# Patient Record
Sex: Female | Born: 1965 | Race: Black or African American | Hispanic: No | Marital: Married | Smoking: Never smoker
Health system: Southern US, Community
[De-identification: ages and names within clinical notes are randomized; demographics above are authoritative.]

## PROBLEM LIST (undated history)

## (undated) DIAGNOSIS — K219 Gastro-esophageal reflux disease without esophagitis: Secondary | ICD-10-CM

## (undated) DIAGNOSIS — IMO0001 Reserved for inherently not codable concepts without codable children: Secondary | ICD-10-CM

## (undated) DIAGNOSIS — M4800 Spinal stenosis, site unspecified: Secondary | ICD-10-CM

## (undated) DIAGNOSIS — I10 Essential (primary) hypertension: Secondary | ICD-10-CM

## (undated) DIAGNOSIS — E785 Hyperlipidemia, unspecified: Secondary | ICD-10-CM

## (undated) DIAGNOSIS — K449 Diaphragmatic hernia without obstruction or gangrene: Secondary | ICD-10-CM

## (undated) DIAGNOSIS — E039 Hypothyroidism, unspecified: Secondary | ICD-10-CM

## (undated) DIAGNOSIS — K2970 Gastritis, unspecified, without bleeding: Secondary | ICD-10-CM

## (undated) DIAGNOSIS — E669 Obesity, unspecified: Secondary | ICD-10-CM

## (undated) DIAGNOSIS — G8929 Other chronic pain: Secondary | ICD-10-CM

## (undated) DIAGNOSIS — R42 Dizziness and giddiness: Secondary | ICD-10-CM

## (undated) DIAGNOSIS — D649 Anemia, unspecified: Secondary | ICD-10-CM

## (undated) DIAGNOSIS — R7303 Prediabetes: Secondary | ICD-10-CM

## (undated) DIAGNOSIS — Z973 Presence of spectacles and contact lenses: Secondary | ICD-10-CM

## (undated) DIAGNOSIS — R519 Headache, unspecified: Secondary | ICD-10-CM

## (undated) DIAGNOSIS — E079 Disorder of thyroid, unspecified: Secondary | ICD-10-CM

## (undated) HISTORY — DX: Gastro-esophageal reflux disease without esophagitis: K21.9

## (undated) HISTORY — PX: THYROIDECTOMY: SHX17

## (undated) HISTORY — DX: Headache, unspecified: R51.9

## (undated) HISTORY — PX: CHOLECYSTECTOMY: SHX55

## (undated) HISTORY — DX: Other chronic pain: G89.29

## (undated) HISTORY — DX: Hyperlipidemia, unspecified: E78.5

## (undated) HISTORY — DX: Essential (primary) hypertension: I10

---

## 2010-12-19 ENCOUNTER — Ambulatory Visit: Payer: Self-pay

## 2010-12-26 ENCOUNTER — Ambulatory Visit: Payer: Self-pay

## 2011-01-26 ENCOUNTER — Ambulatory Visit: Payer: Self-pay

## 2011-02-19 ENCOUNTER — Observation Stay: Payer: Self-pay

## 2011-03-01 ENCOUNTER — Inpatient Hospital Stay: Payer: Self-pay

## 2011-09-06 ENCOUNTER — Emergency Department: Payer: Self-pay | Admitting: Emergency Medicine

## 2011-09-06 LAB — URINALYSIS, COMPLETE
Bacteria: NONE SEEN
Bilirubin,UR: NEGATIVE
Blood: NEGATIVE
Glucose,UR: NEGATIVE mg/dL (ref 0–75)
Ketone: NEGATIVE
Leukocyte Esterase: NEGATIVE
Nitrite: NEGATIVE
Ph: 7 (ref 4.5–8.0)
Protein: NEGATIVE
RBC,UR: 1 /HPF (ref 0–5)
Specific Gravity: 1.003 (ref 1.003–1.030)
Squamous Epithelial: <1
WBC UR: <1 /HPF (ref 0–5)

## 2011-09-06 LAB — CK TOTAL AND CKMB (NOT AT ARMC)
CK, Total: 128 U/L (ref 21–215)
CK-MB: 0.7 ng/mL (ref 0.5–3.6)

## 2011-09-06 LAB — PREGNANCY, URINE: Pregnancy Test, Urine: NEGATIVE m[IU]/mL

## 2011-09-06 LAB — COMPREHENSIVE METABOLIC PANEL
Albumin: 3.9 g/dL (ref 3.4–5.0)
Alkaline Phosphatase: 49 U/L — ABNORMAL LOW (ref 50–136)
Anion Gap: 8 (ref 7–16)
BUN: 13 mg/dL (ref 7–18)
Bilirubin,Total: 0.3 mg/dL (ref 0.2–1.0)
Calcium, Total: 9.2 mg/dL (ref 8.5–10.1)
Chloride: 105 mmol/L (ref 98–107)
Co2: 24 mmol/L (ref 21–32)
Creatinine: 0.62 mg/dL (ref 0.60–1.30)
EGFR (African American): >60
EGFR (Non-African Amer.): >60
Glucose: 109 mg/dL — ABNORMAL HIGH (ref 65–99)
Osmolality: 275 (ref 275–301)
Potassium: 3.6 mmol/L (ref 3.5–5.1)
SGOT(AST): 20 U/L (ref 15–37)
SGPT (ALT): 29 U/L
Sodium: 137 mmol/L (ref 136–145)
Total Protein: 7.6 g/dL (ref 6.4–8.2)

## 2011-09-06 LAB — TROPONIN I: Troponin-I: <0.02 ng/mL

## 2011-09-06 LAB — CBC
HCT: 38.3 % (ref 35.0–47.0)
HGB: 13.7 g/dL (ref 12.0–16.0)
MCH: 31.8 pg (ref 26.0–34.0)
MCHC: 35.7 g/dL (ref 32.0–36.0)
MCV: 89 fL (ref 80–100)
Platelet: 245 10*3/uL (ref 150–440)
RBC: 4.3 10*6/uL (ref 3.80–5.20)
RDW: 13.5 % (ref 11.5–14.5)
WBC: 6.7 10*3/uL (ref 3.6–11.0)

## 2011-09-06 IMAGING — CT CT HEAD WITHOUT CONTRAST
2 series · 16 of 30 positions shown, 20 images · non-contrast
Comparison: none

REASON FOR EXAM: dizzy
COMMENTS:

[Series 2: without · axial · non-contrast · 0.42mm/px · z∈[+1232,+1352]mm · 13 of 28 slices shown, 17 images]
[im 2/28  brain]
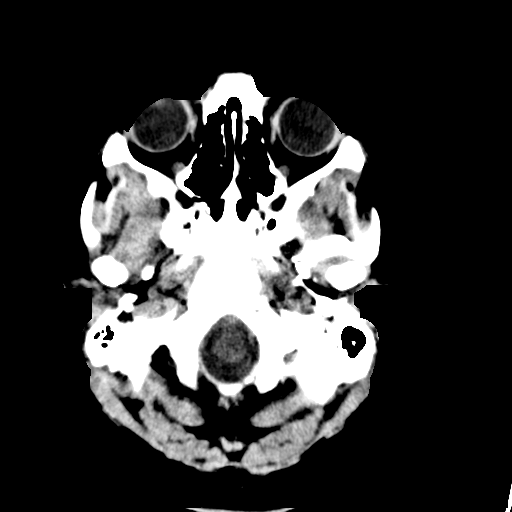
[im 2/28  bone]
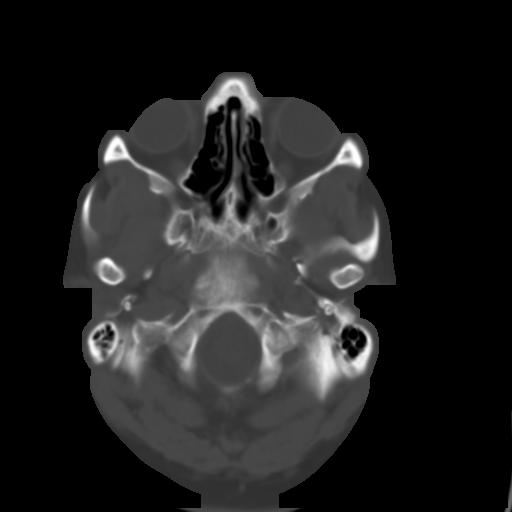
[im 4/28  brain]
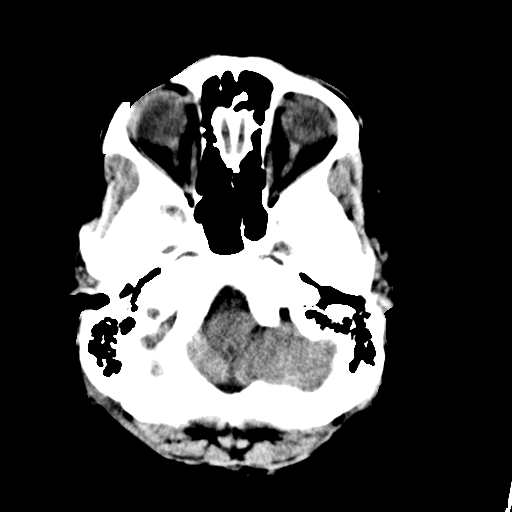
[im 6/28  brain]
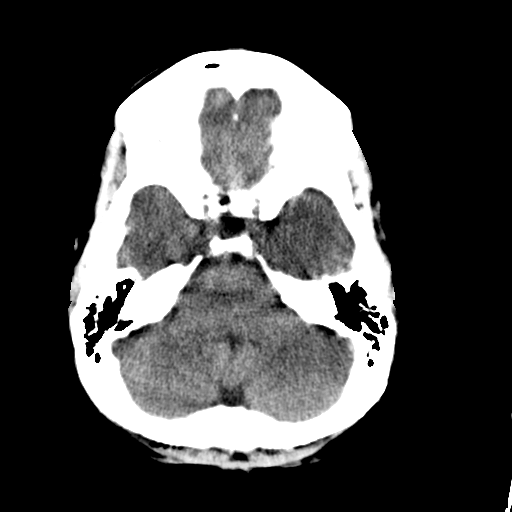
[im 8/28  brain]
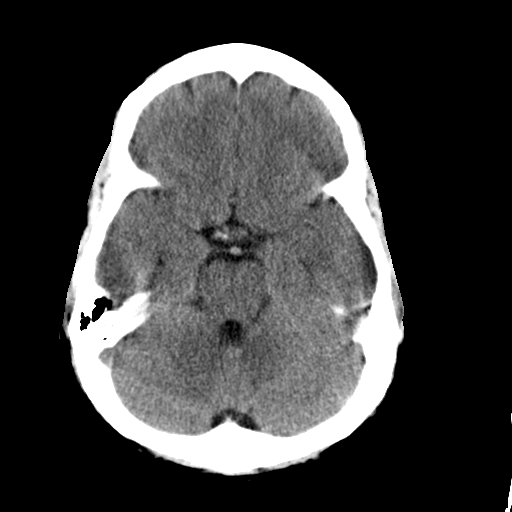
[im 10/28  brain]
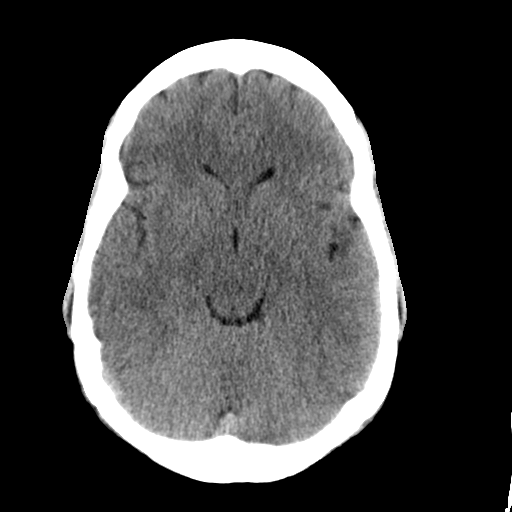
[im 10/28  bone]
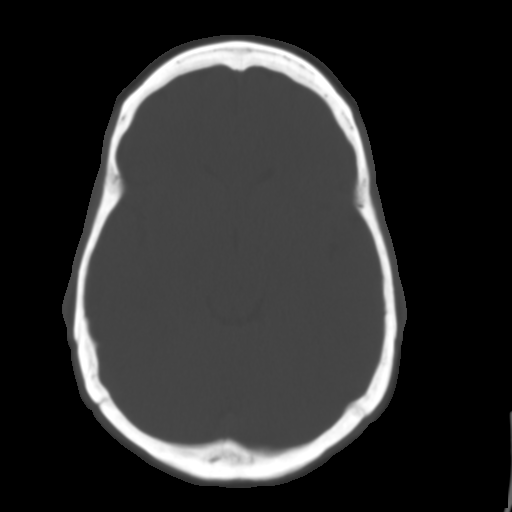
[im 12/28  brain]
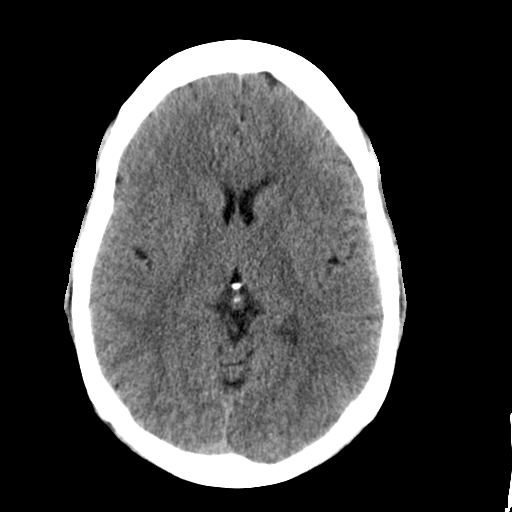
[im 14/28  brain]
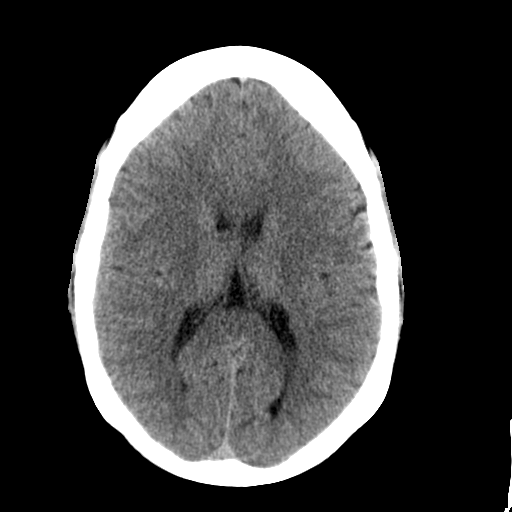
[im 16/28  brain]
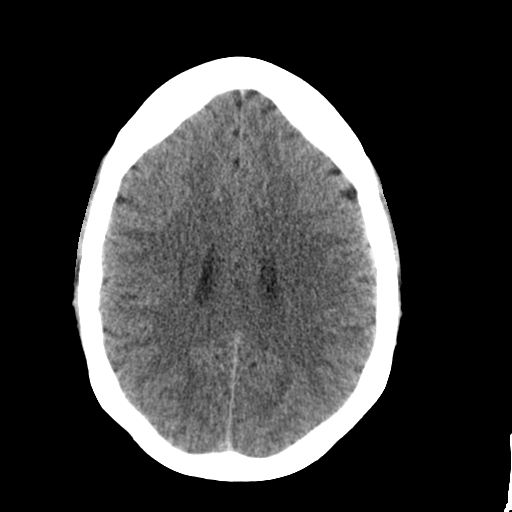
[im 18/28  brain]
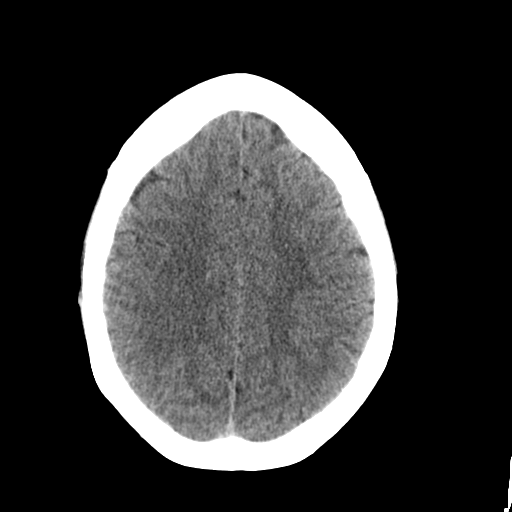
[im 18/28  bone]
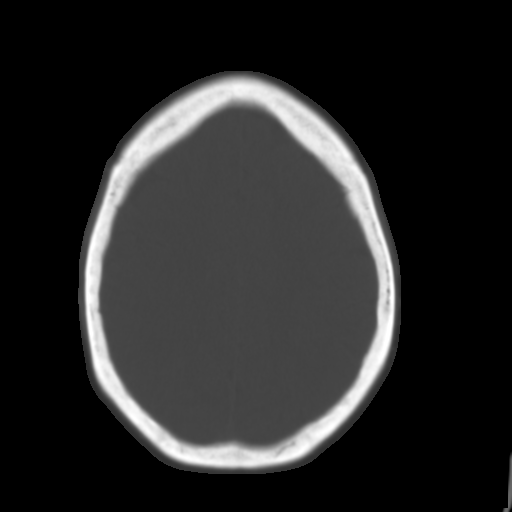
[im 20/28  brain]
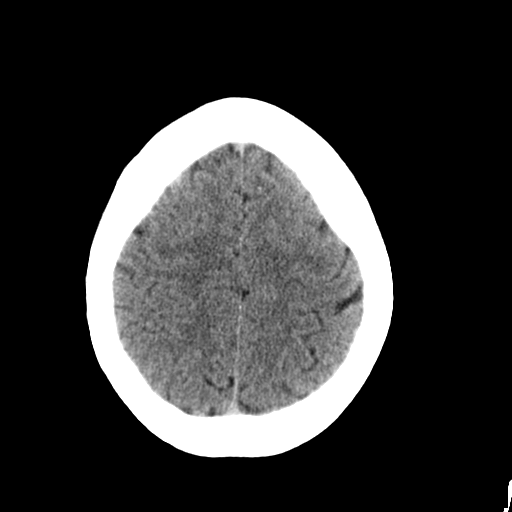
[im 22/28  brain]
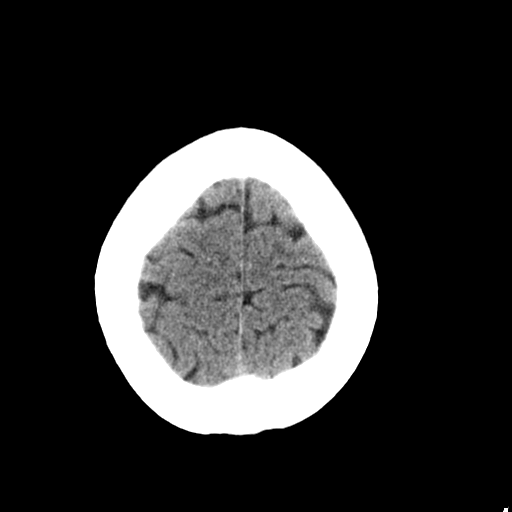
[im 24/28  brain]
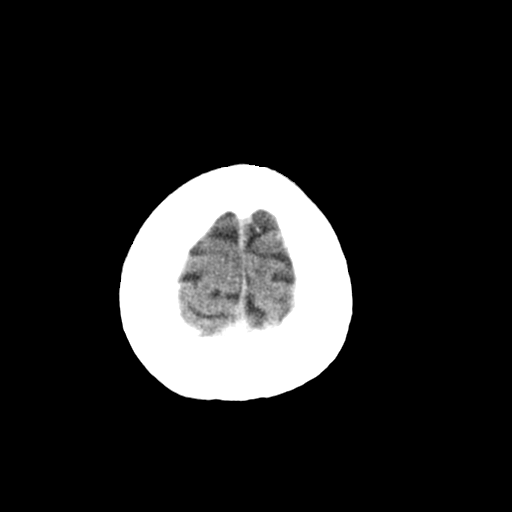
[im 26/28  brain]
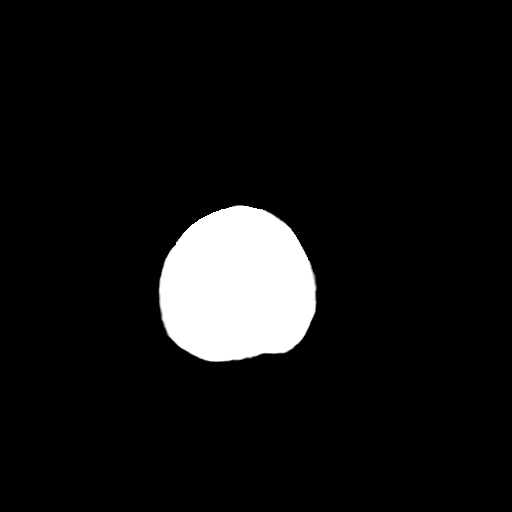
[im 26/28  bone]
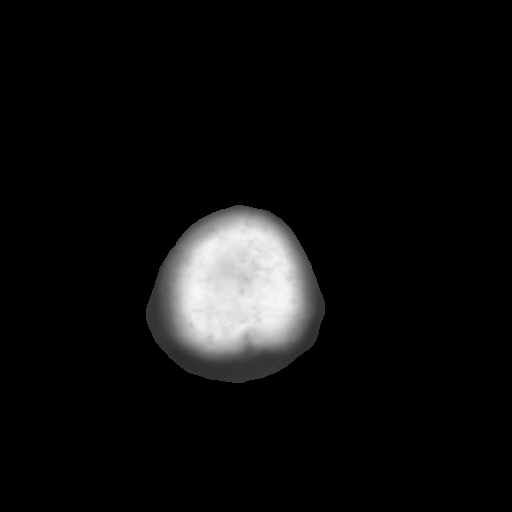

[Series 3: bone · axial · 0.42mm/px · z∈[+1232,+1272]mm · 3 of 28 slices shown]
[im 2/28  bone]
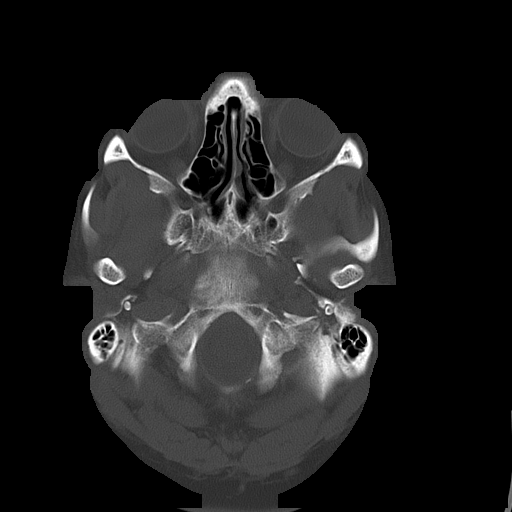
[im 6/28  bone]
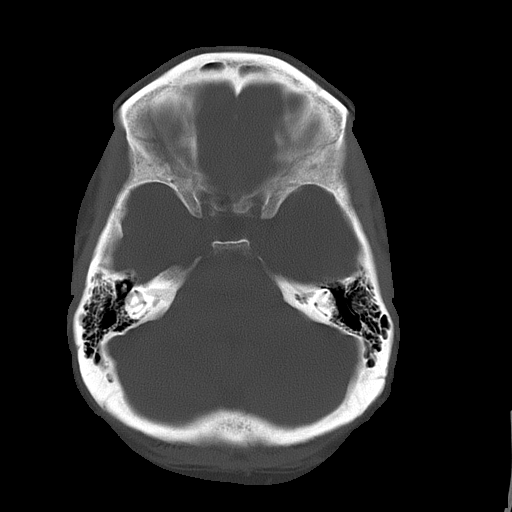
[im 10/28  bone]
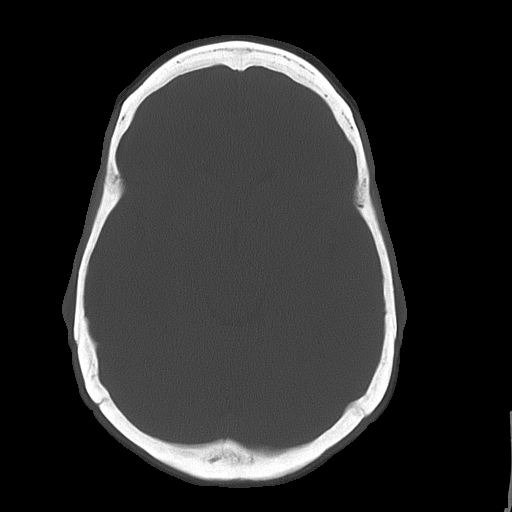

[16 of 30 positions shown; findings below may reference images not displayed]

PROCEDURE:     CT  - CT HEAD WITHOUT CONTRAST  - [DATE]  [DATE]

RESULT:     Noncontrast emergent CT of the brain is performed. There is no
previous exam for comparison.

The ventricles and sulci are normal. There is no hemorrhage. There is no
focal mass, mass-effect or midline shift. There is no evidence of edema or
territorial infarct. The bone windows demonstrate normal aeration of the
paranasal sinuses and mastoid air cells. There is no skull fracture
demonstrated.
IMPRESSION: 1. No acute intracranial abnormality.

## 2012-04-16 ENCOUNTER — Emergency Department: Payer: Self-pay | Admitting: Emergency Medicine

## 2012-04-16 LAB — BASIC METABOLIC PANEL
Anion Gap: 10 (ref 7–16)
BUN: 10 mg/dL (ref 7–18)
Calcium, Total: 9.2 mg/dL (ref 8.5–10.1)
Chloride: 107 mmol/L (ref 98–107)
Co2: 25 mmol/L (ref 21–32)
Creatinine: 0.78 mg/dL (ref 0.60–1.30)
EGFR (African American): >60
EGFR (Non-African Amer.): >60
Glucose: 102 mg/dL — ABNORMAL HIGH (ref 65–99)
Osmolality: 282 (ref 275–301)
Potassium: 3.7 mmol/L (ref 3.5–5.1)
Sodium: 142 mmol/L (ref 136–145)

## 2012-04-16 LAB — CBC
HCT: 36.3 % (ref 35.0–47.0)
HGB: 12.8 g/dL (ref 12.0–16.0)
MCH: 31.1 pg (ref 26.0–34.0)
MCHC: 35.2 g/dL (ref 32.0–36.0)
MCV: 88 fL (ref 80–100)
Platelet: 252 10*3/uL (ref 150–440)
RBC: 4.11 10*6/uL (ref 3.80–5.20)
RDW: 12.9 % (ref 11.5–14.5)
WBC: 7.4 10*3/uL (ref 3.6–11.0)

## 2012-04-16 LAB — TROPONIN I: Troponin-I: <0.02 ng/mL

## 2012-04-16 LAB — CK TOTAL AND CKMB (NOT AT ARMC)
CK, Total: 147 U/L (ref 21–215)
CK-MB: 1.1 ng/mL (ref 0.5–3.6)

## 2012-04-16 IMAGING — US ABDOMEN ULTRASOUND LIMITED
1 series · 14 of 25 positions shown · non-contrast
Comparison: none

REASON FOR EXAM: RUQ pain, nausea
COMMENTS:   Body Site: GB and Fossa, CBD, Head of Pancreas

PROCEDURE:     US  - US ABDOMEN LIMITED SURVEY  - [DATE]  [DATE]
RESULT:     Prominent nonmobile stone noted in the gallbladder. Gallbladder
wall thickness 2.9 mm. Negative Murphy's sign. Common bile duct diameter
mm. Pancreas is nonvisualized.

[Series 1: abdomen ultrasound limited · 0.31mm/px · 14 of 41 slices shown]
[im 1/41]
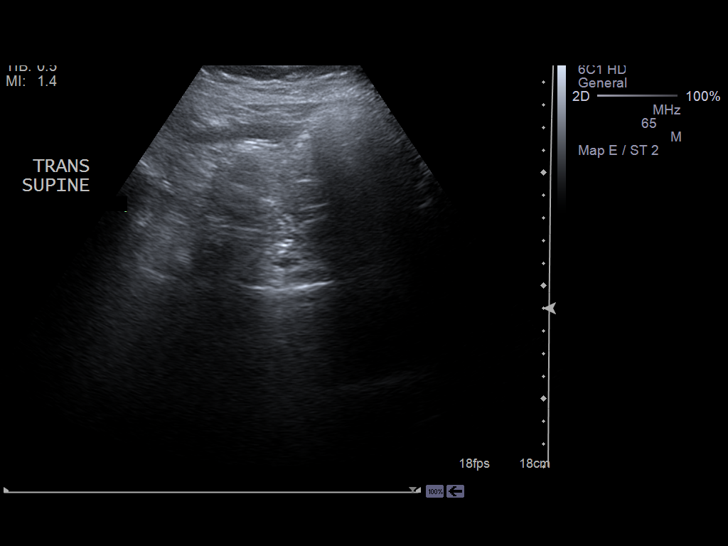
[im 4/41]
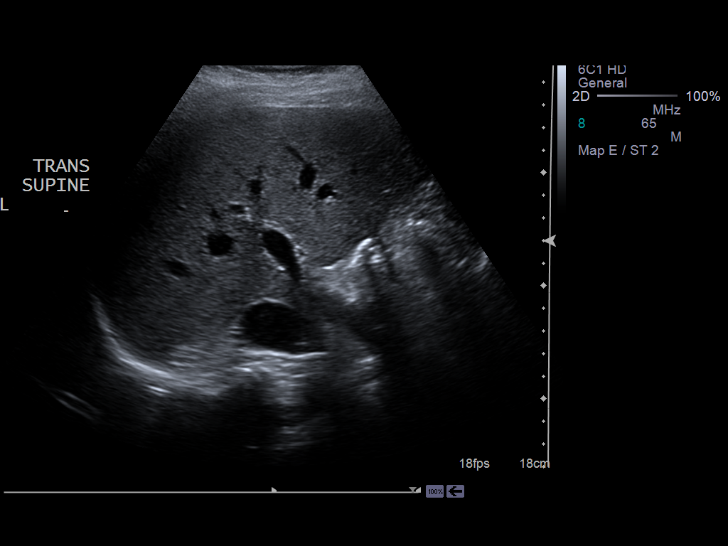
[im 7/41]
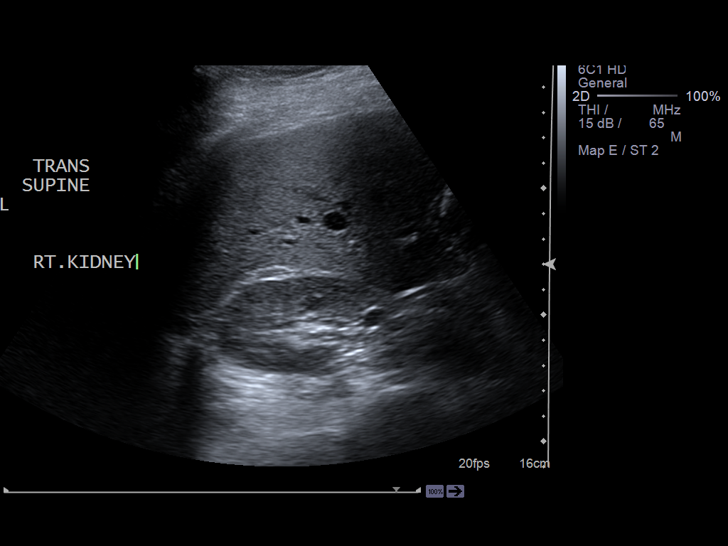
[im 11/41]
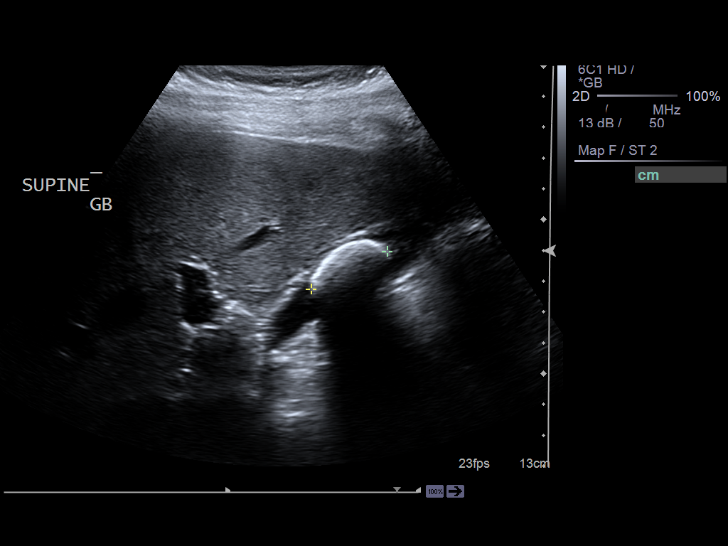
[im 14/41]
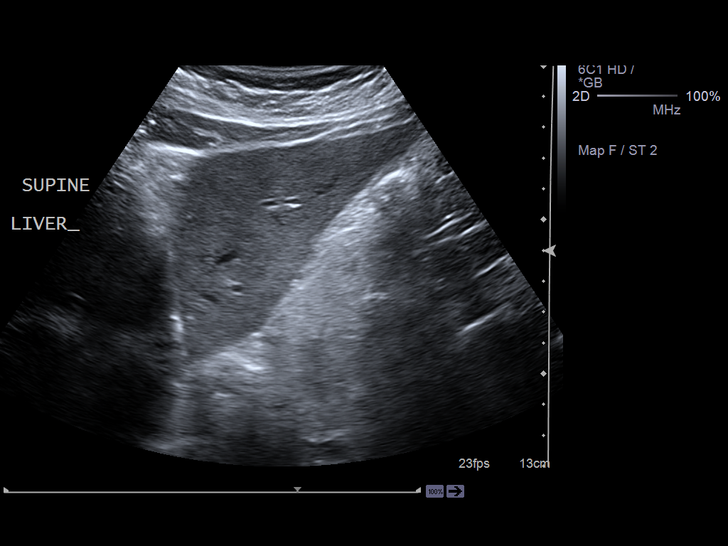
[im 16/41]
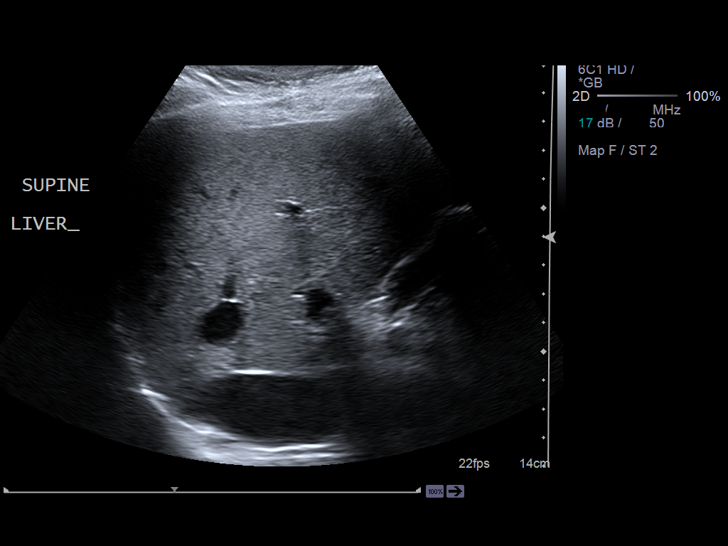
[im 19/41]
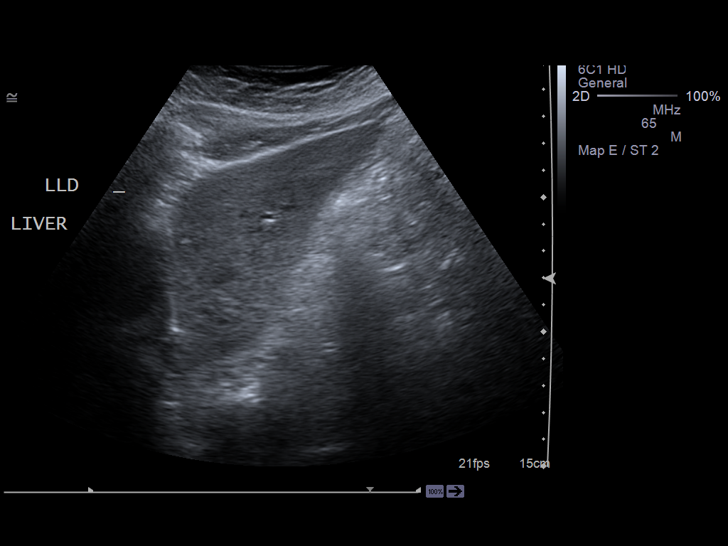
[im 22/41]
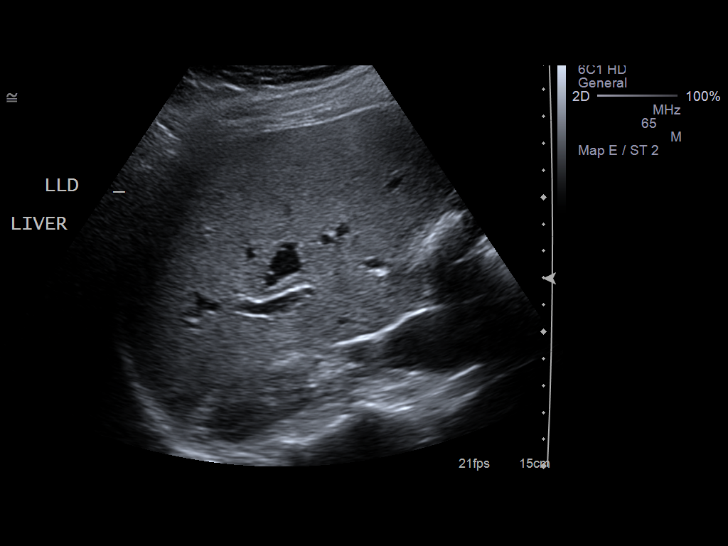
[im 26/41]
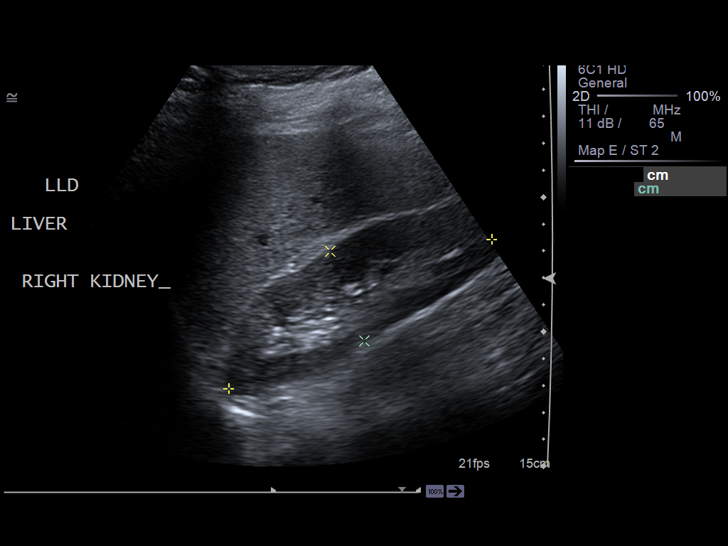
[im 27/41]
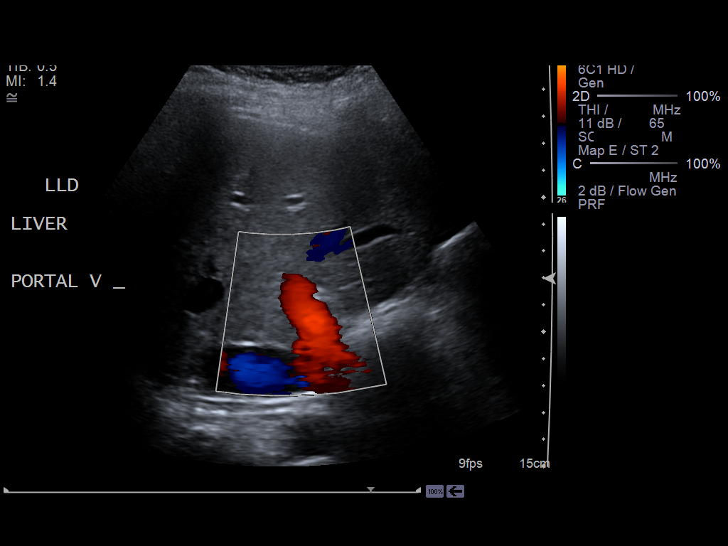
[im 31/41]
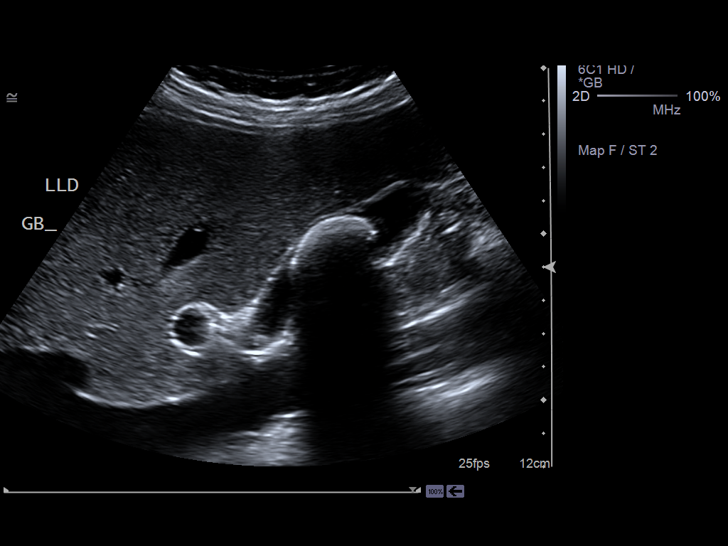
[im 34/41]
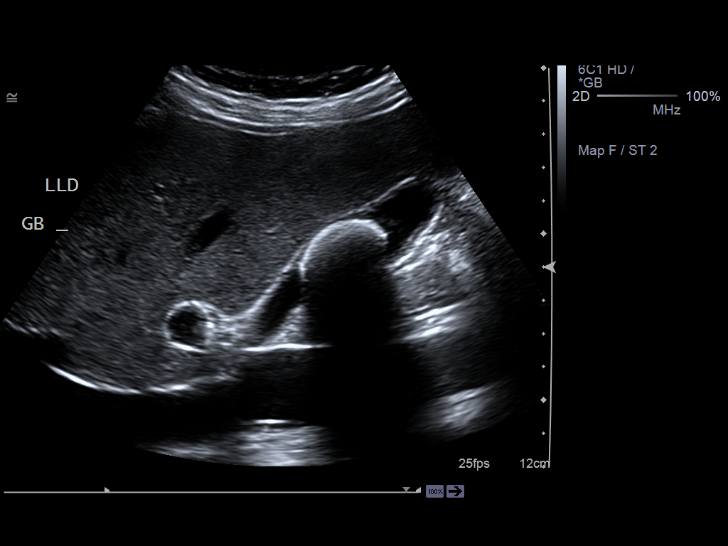
[im 37/41]
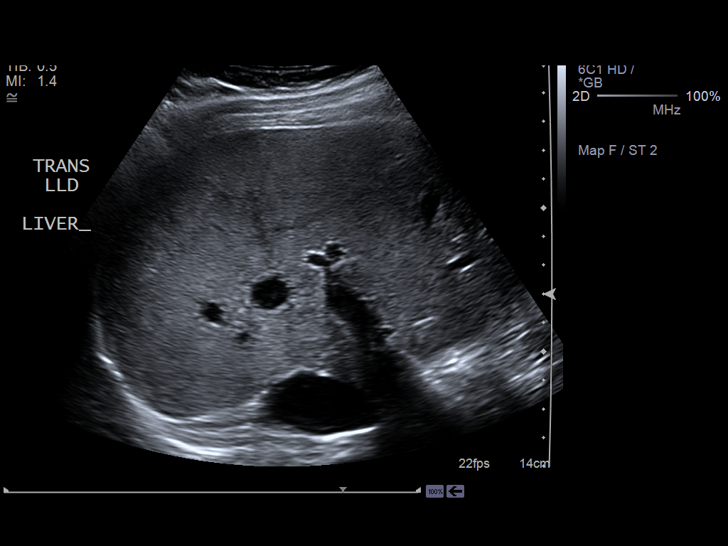
[im 41/41]
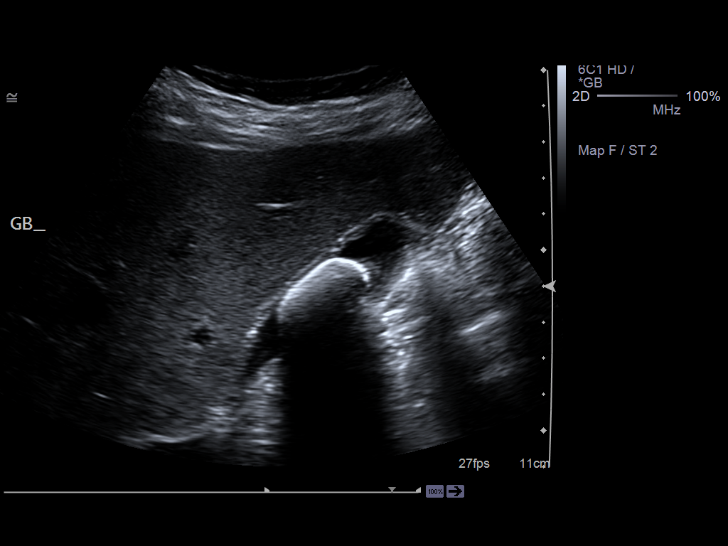

[14 of 25 positions shown; findings below may reference images not displayed]

IMPRESSION: Cholelithiasis.

## 2012-04-16 IMAGING — CR DG CHEST 2V
1 series · 2 of 2 positions shown · non-contrast
Comparison: none

REASON FOR EXAM: CP
COMMENTS:

PROCEDURE:     DXR - DXR CHEST PA (OR AP) AND LATERAL  - [DATE]  [DATE]
RESULT:     Comparison: None

[Series 1: w chest pa · 0.14mm/px · 2 of 2 slices shown]
[im 1/2]
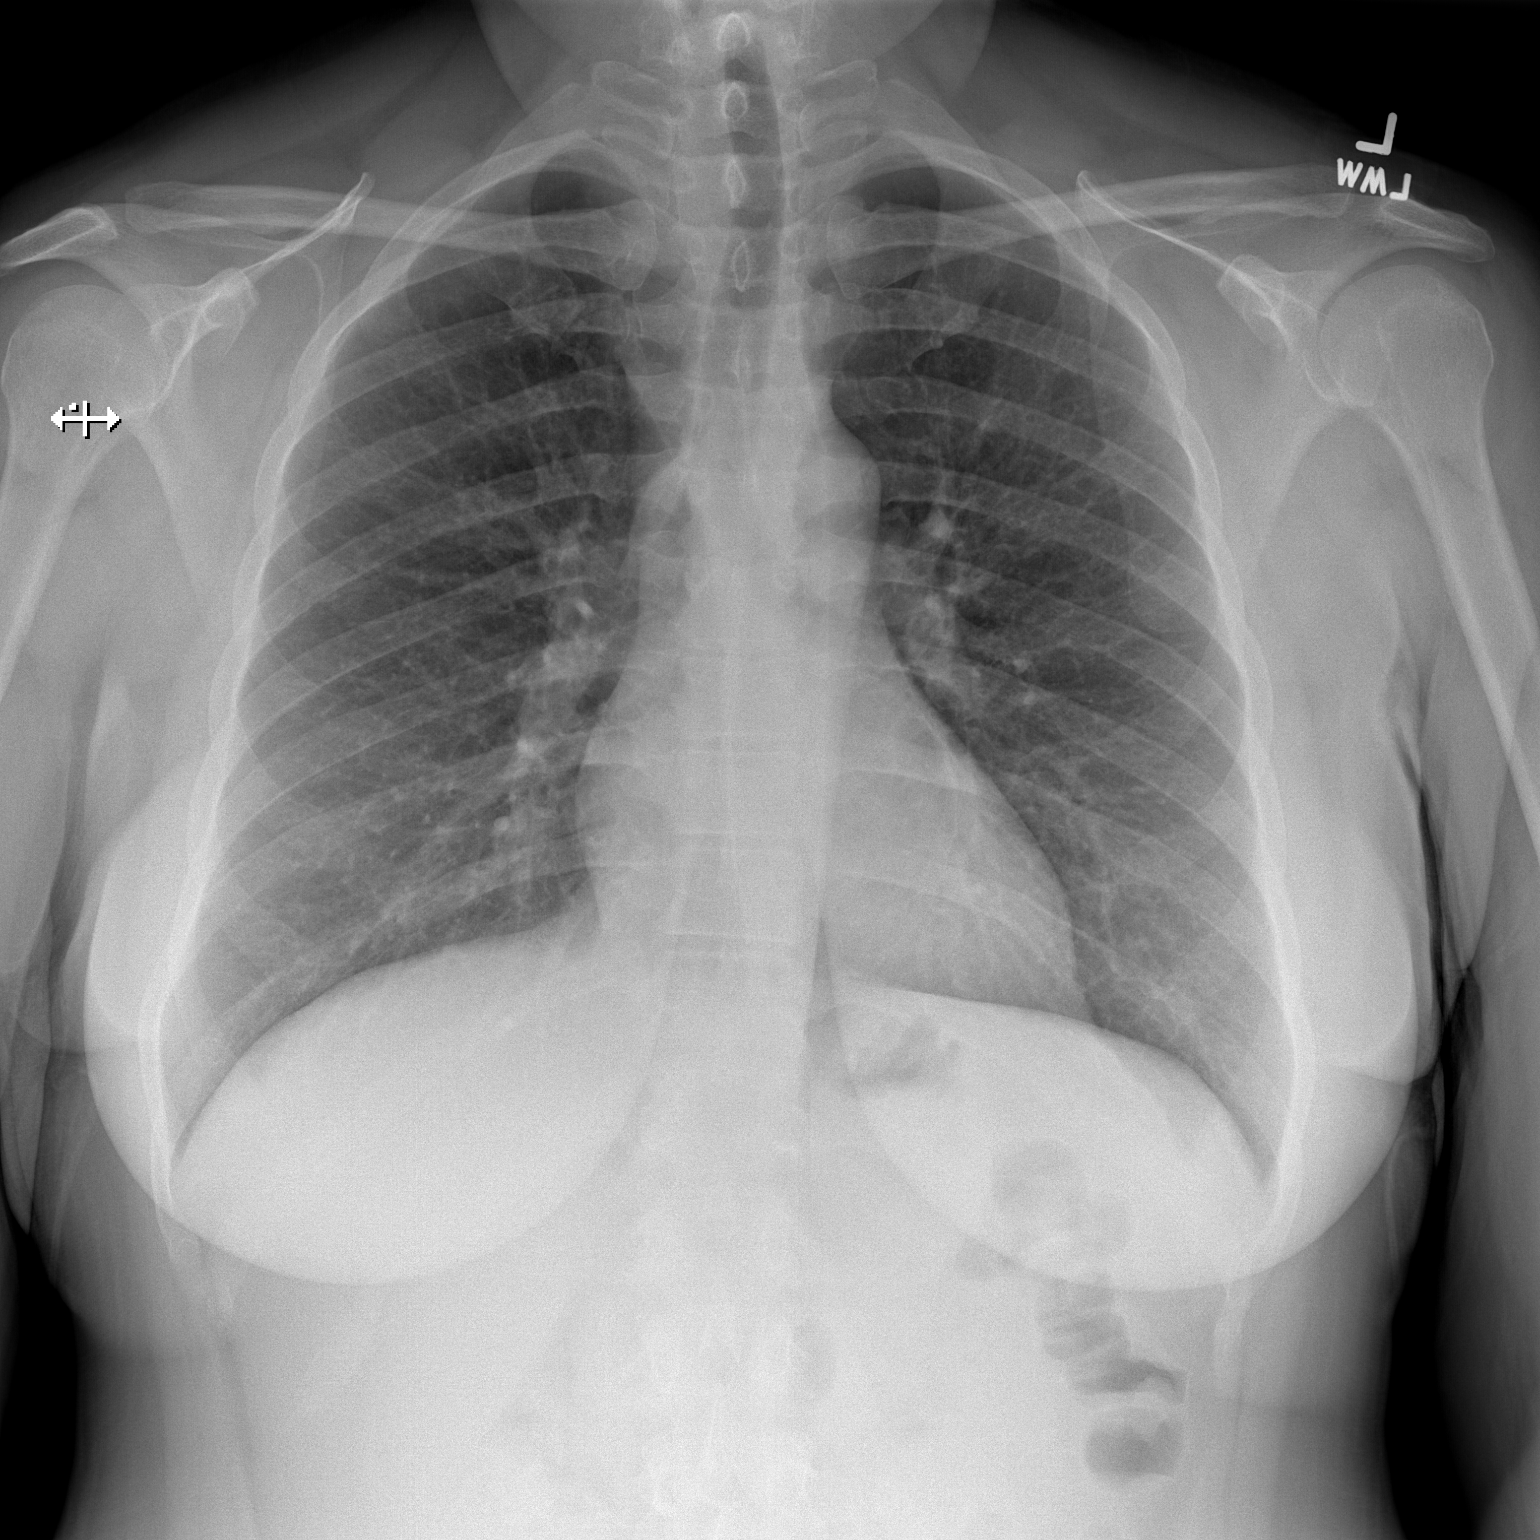
[im 2/2]
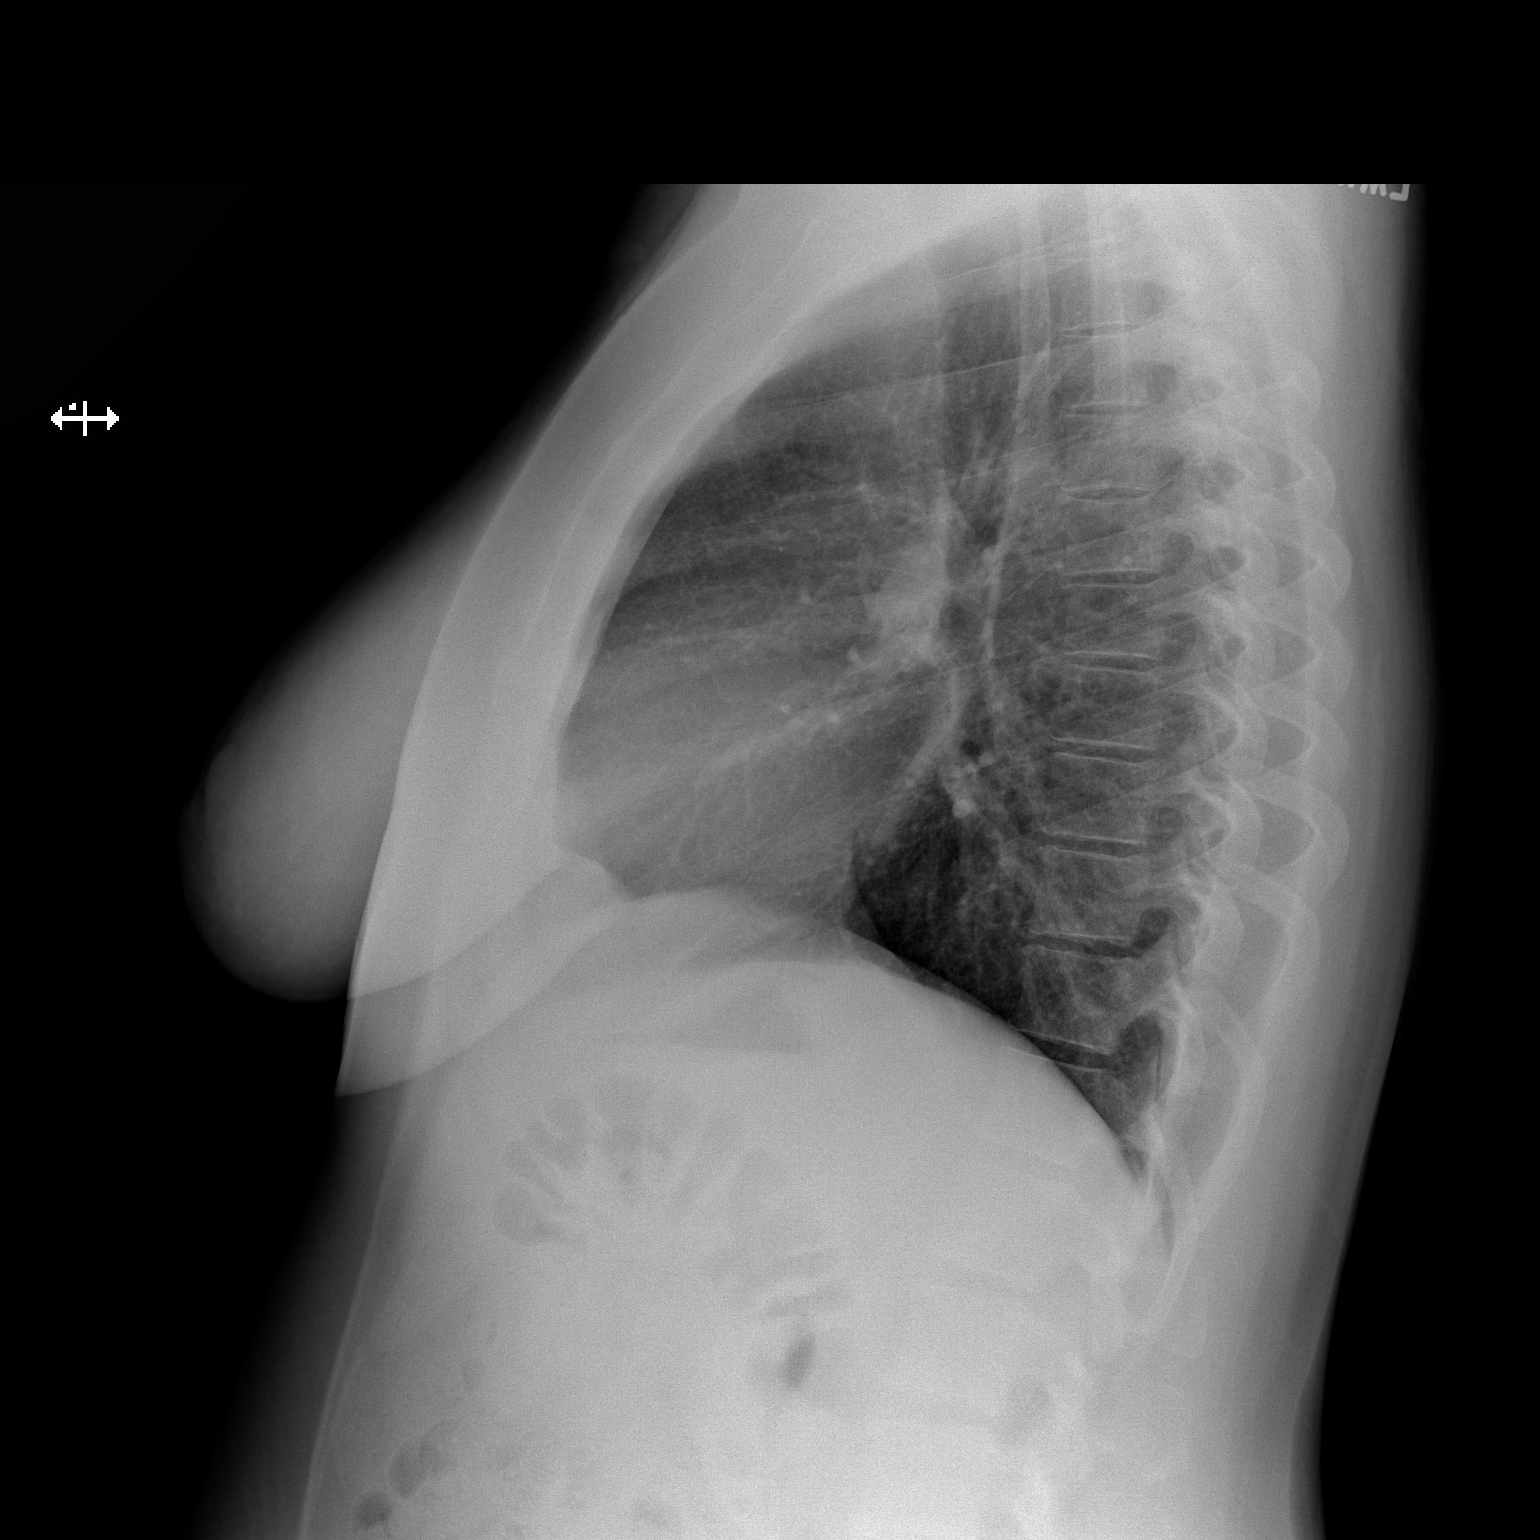

[2 of 2 positions shown; findings below may reference images not displayed]

FINDINGS: PA and lateral chest radiographs are provided.  There is no focal
parenchymal opacity, pleural effusion, or pneumothorax. The heart and
mediastinum are unremarkable.  The osseous structures are unremarkable.
IMPRESSION: No acute disease of the che[REDACTED]

## 2012-08-28 ENCOUNTER — Emergency Department: Payer: Self-pay | Admitting: Emergency Medicine

## 2012-08-28 LAB — CBC WITH DIFFERENTIAL/PLATELET
Basophil #: 0.1 10*3/uL (ref 0.0–0.1)
Basophil %: 0.8 %
Eosinophil #: 0.2 10*3/uL (ref 0.0–0.7)
Eosinophil %: 2.8 %
HCT: 36.3 % (ref 35.0–47.0)
HGB: 12.9 g/dL (ref 12.0–16.0)
Lymphocyte #: 2.8 10*3/uL (ref 1.0–3.6)
Lymphocyte %: 40.3 %
MCH: 31.4 pg (ref 26.0–34.0)
MCHC: 35.4 g/dL (ref 32.0–36.0)
MCV: 89 fL (ref 80–100)
Monocyte #: 0.4 x10 3/mm (ref 0.2–0.9)
Monocyte %: 5.6 %
Neutrophil #: 3.5 10*3/uL (ref 1.4–6.5)
Neutrophil %: 50.5 %
Platelet: 313 10*3/uL (ref 150–440)
RBC: 4.1 10*6/uL (ref 3.80–5.20)
RDW: 12.7 % (ref 11.5–14.5)
WBC: 7 10*3/uL (ref 3.6–11.0)

## 2012-08-28 LAB — COMPREHENSIVE METABOLIC PANEL
Albumin: 3.8 g/dL (ref 3.4–5.0)
Alkaline Phosphatase: 49 U/L — ABNORMAL LOW (ref 50–136)
Anion Gap: 6 — ABNORMAL LOW (ref 7–16)
BUN: 10 mg/dL (ref 7–18)
Bilirubin,Total: 0.4 mg/dL (ref 0.2–1.0)
Calcium, Total: 8.6 mg/dL (ref 8.5–10.1)
Chloride: 109 mmol/L — ABNORMAL HIGH (ref 98–107)
Co2: 26 mmol/L (ref 21–32)
Creatinine: 0.66 mg/dL (ref 0.60–1.30)
EGFR (African American): >60
EGFR (Non-African Amer.): >60
Glucose: 85 mg/dL (ref 65–99)
Osmolality: 280 (ref 275–301)
Potassium: 4 mmol/L (ref 3.5–5.1)
SGOT(AST): 18 U/L (ref 15–37)
SGPT (ALT): 26 U/L (ref 12–78)
Sodium: 141 mmol/L (ref 136–145)
Total Protein: 7.6 g/dL (ref 6.4–8.2)

## 2012-08-28 LAB — TROPONIN I
Troponin-I: <0.02 ng/mL
Troponin-I: <0.02 ng/mL

## 2012-08-28 IMAGING — CR DG CHEST 2V
1 series · 2 of 2 positions shown · non-contrast
Comparison: none

REASON FOR EXAM: chest pain
COMMENTS:   May transport without cardiac monitor

[Series 1: w chest pa · 0.14mm/px · 2 of 2 slices shown]
[im 1/2]
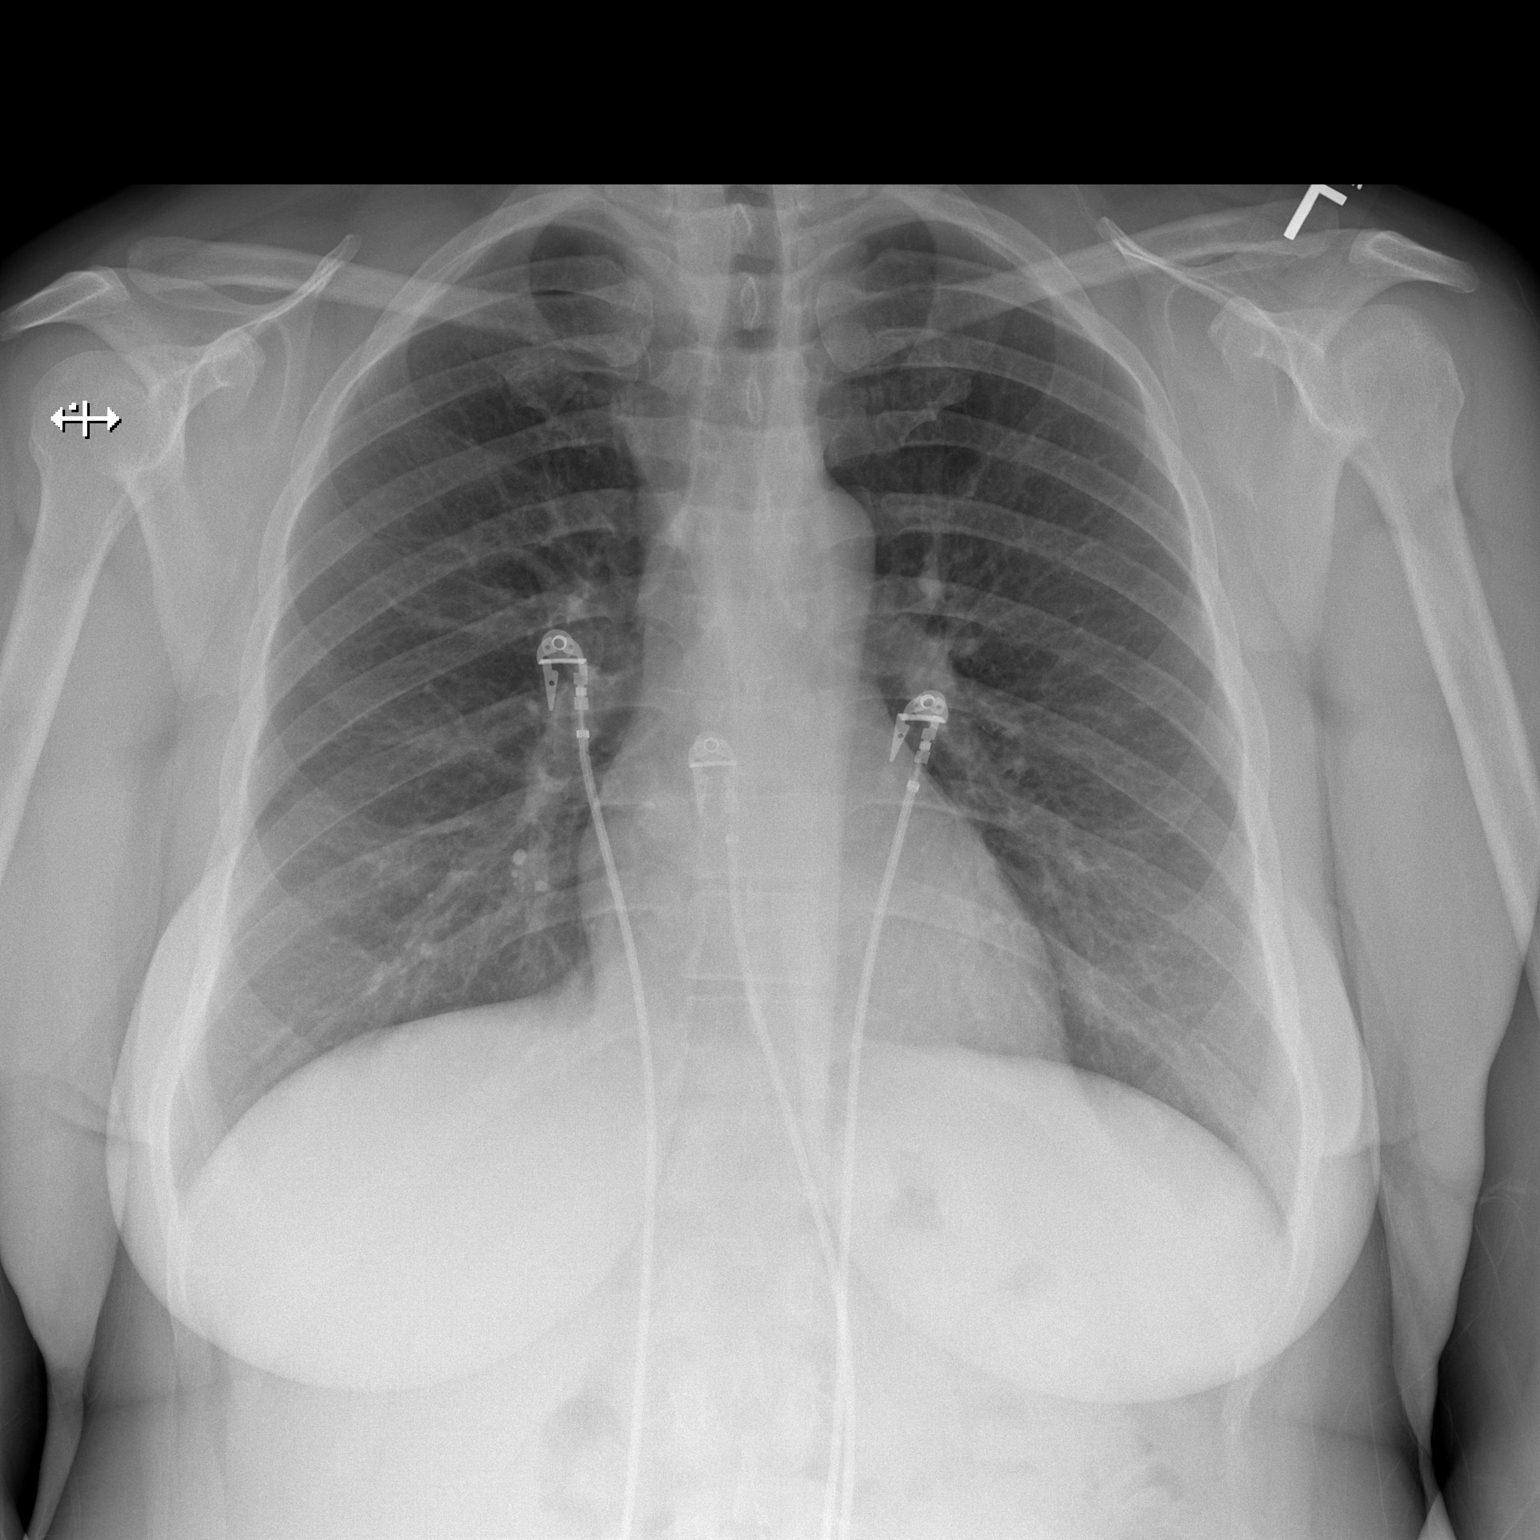
[im 2/2]
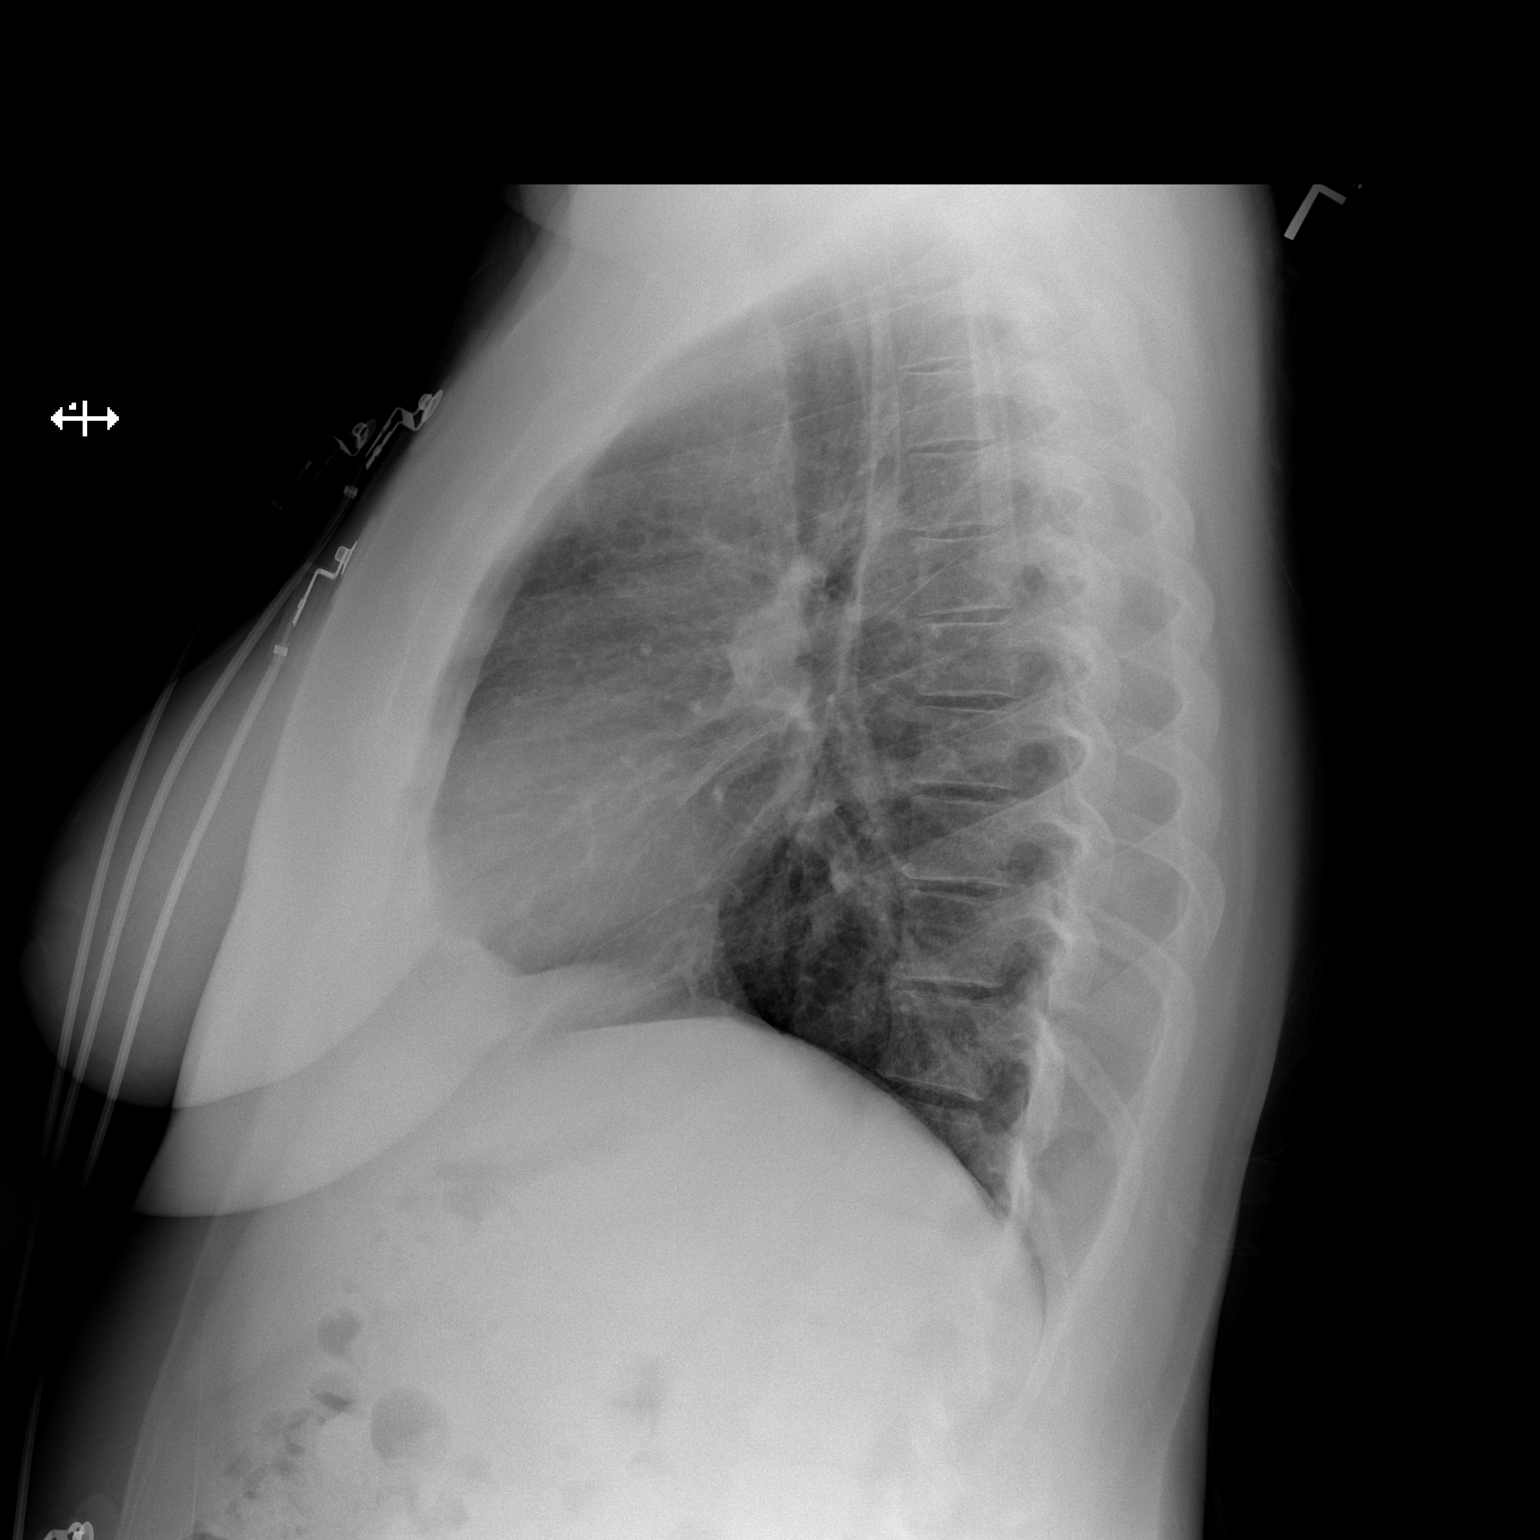

[2 of 2 positions shown; findings below may reference images not displayed]

PROCEDURE:     DXR - DXR CHEST PA (OR AP) AND LATERAL  - [DATE]  [DATE]

RESULT:     Comparison is made to the study [DATE].

Cardiac monitoring electrodes are present. The lungs are clear. The heart
and pulmonary vessels are normal. The bony and mediastinal structures are
unremarkable. There is no effusion. There is no pneumothorax or evidence of
congestive failure.
IMPRESSION: No acute cardiopulmonary disease.

[REDACTED]

## 2012-10-02 ENCOUNTER — Emergency Department: Payer: Self-pay | Admitting: Internal Medicine

## 2012-10-02 LAB — CK TOTAL AND CKMB (NOT AT ARMC)
CK, Total: 98 U/L (ref 21–215)
CK-MB: 0.9 ng/mL (ref 0.5–3.6)

## 2012-10-02 LAB — BASIC METABOLIC PANEL
Anion Gap: 7 (ref 7–16)
BUN: 7 mg/dL (ref 7–18)
Calcium, Total: 9.6 mg/dL (ref 8.5–10.1)
Chloride: 108 mmol/L — ABNORMAL HIGH (ref 98–107)
Co2: 24 mmol/L (ref 21–32)
Creatinine: 0.64 mg/dL (ref 0.60–1.30)
EGFR (African American): >60
EGFR (Non-African Amer.): >60
Glucose: 93 mg/dL (ref 65–99)
Osmolality: 275 (ref 275–301)
Potassium: 3.8 mmol/L (ref 3.5–5.1)
Sodium: 139 mmol/L (ref 136–145)

## 2012-10-02 LAB — CBC
HCT: 38.6 % (ref 35.0–47.0)
HGB: 12.8 g/dL (ref 12.0–16.0)
MCH: 29.6 pg (ref 26.0–34.0)
MCHC: 33.2 g/dL (ref 32.0–36.0)
MCV: 89 fL (ref 80–100)
Platelet: 283 10*3/uL (ref 150–440)
RBC: 4.33 10*6/uL (ref 3.80–5.20)
RDW: 13.2 % (ref 11.5–14.5)
WBC: 8.5 10*3/uL (ref 3.6–11.0)

## 2012-10-02 LAB — TROPONIN I: Troponin-I: <0.02 ng/mL

## 2012-10-02 IMAGING — CR DG CHEST 2V
1 series · 2 of 2 positions shown · non-contrast
Comparison: none

REASON FOR EXAM: Chest Pain
COMMENTS:

[Series 1: pa · 0.17mm/px · 2 of 2 slices shown]
[im 1/2]
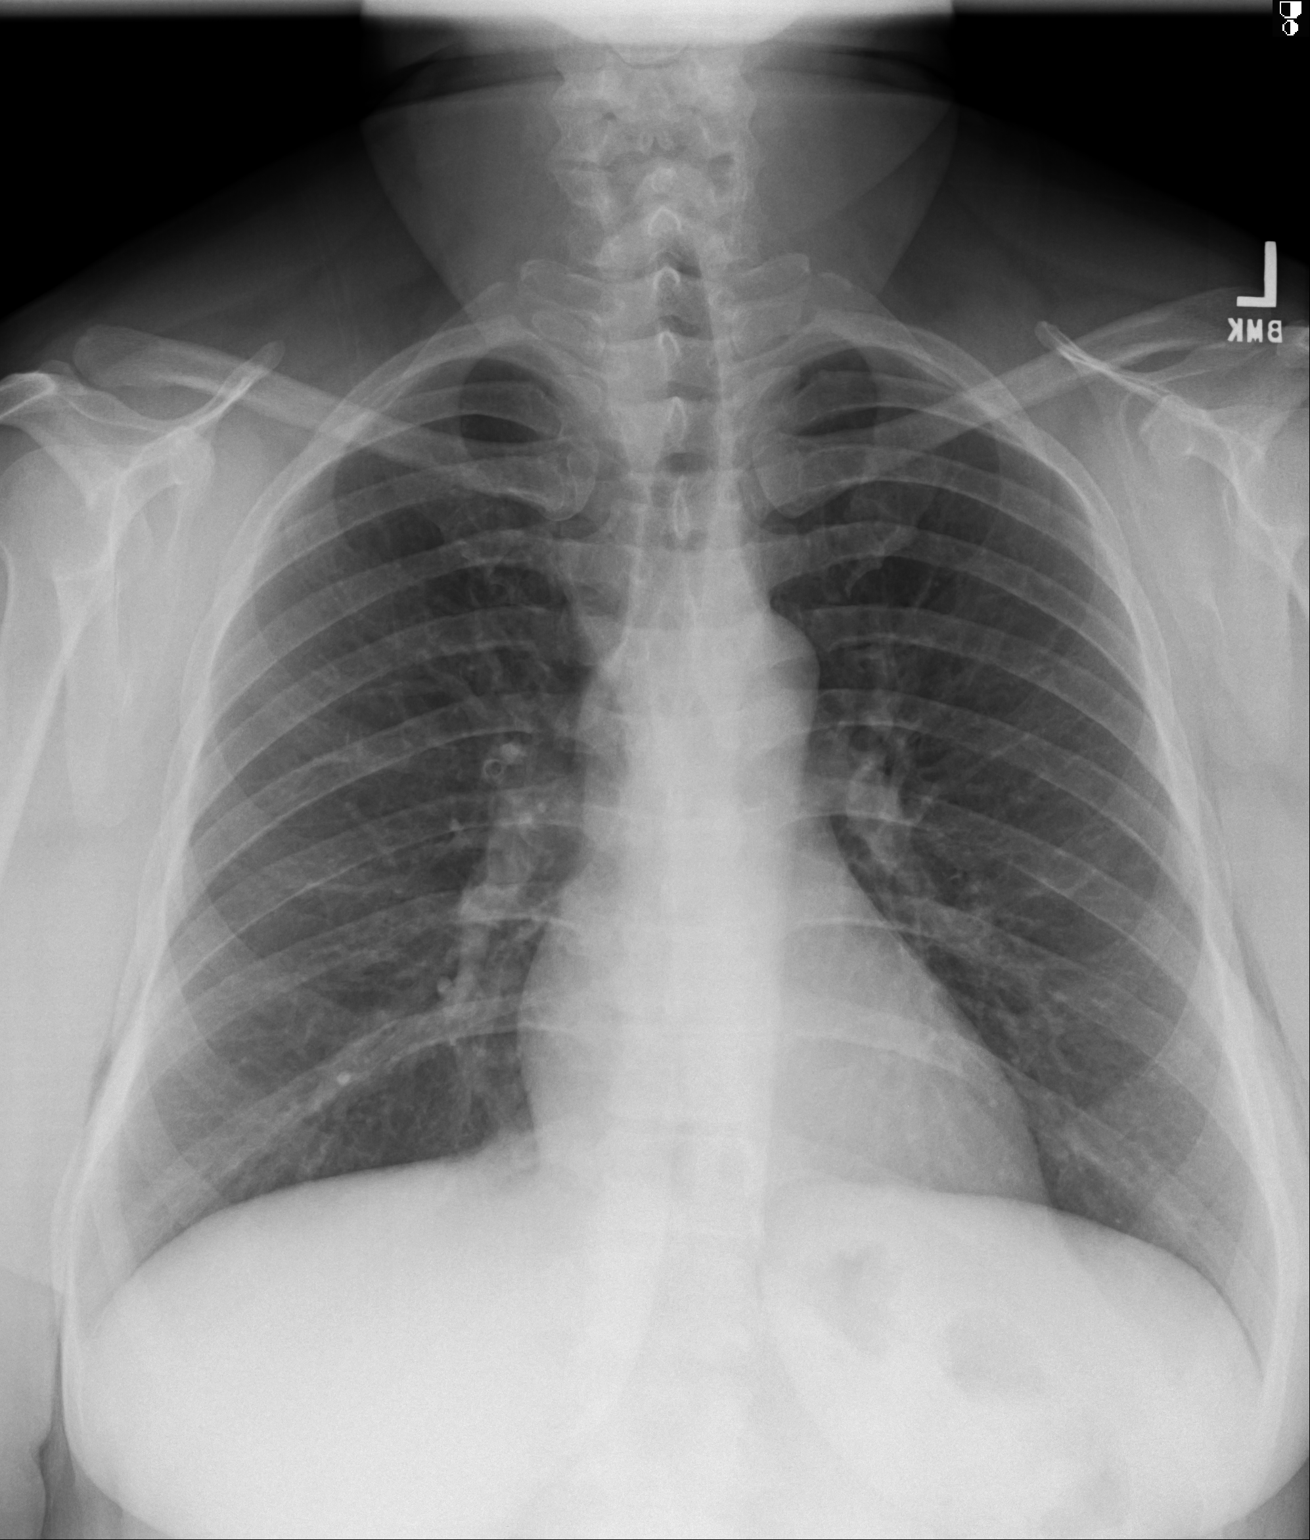
[im 2/2]
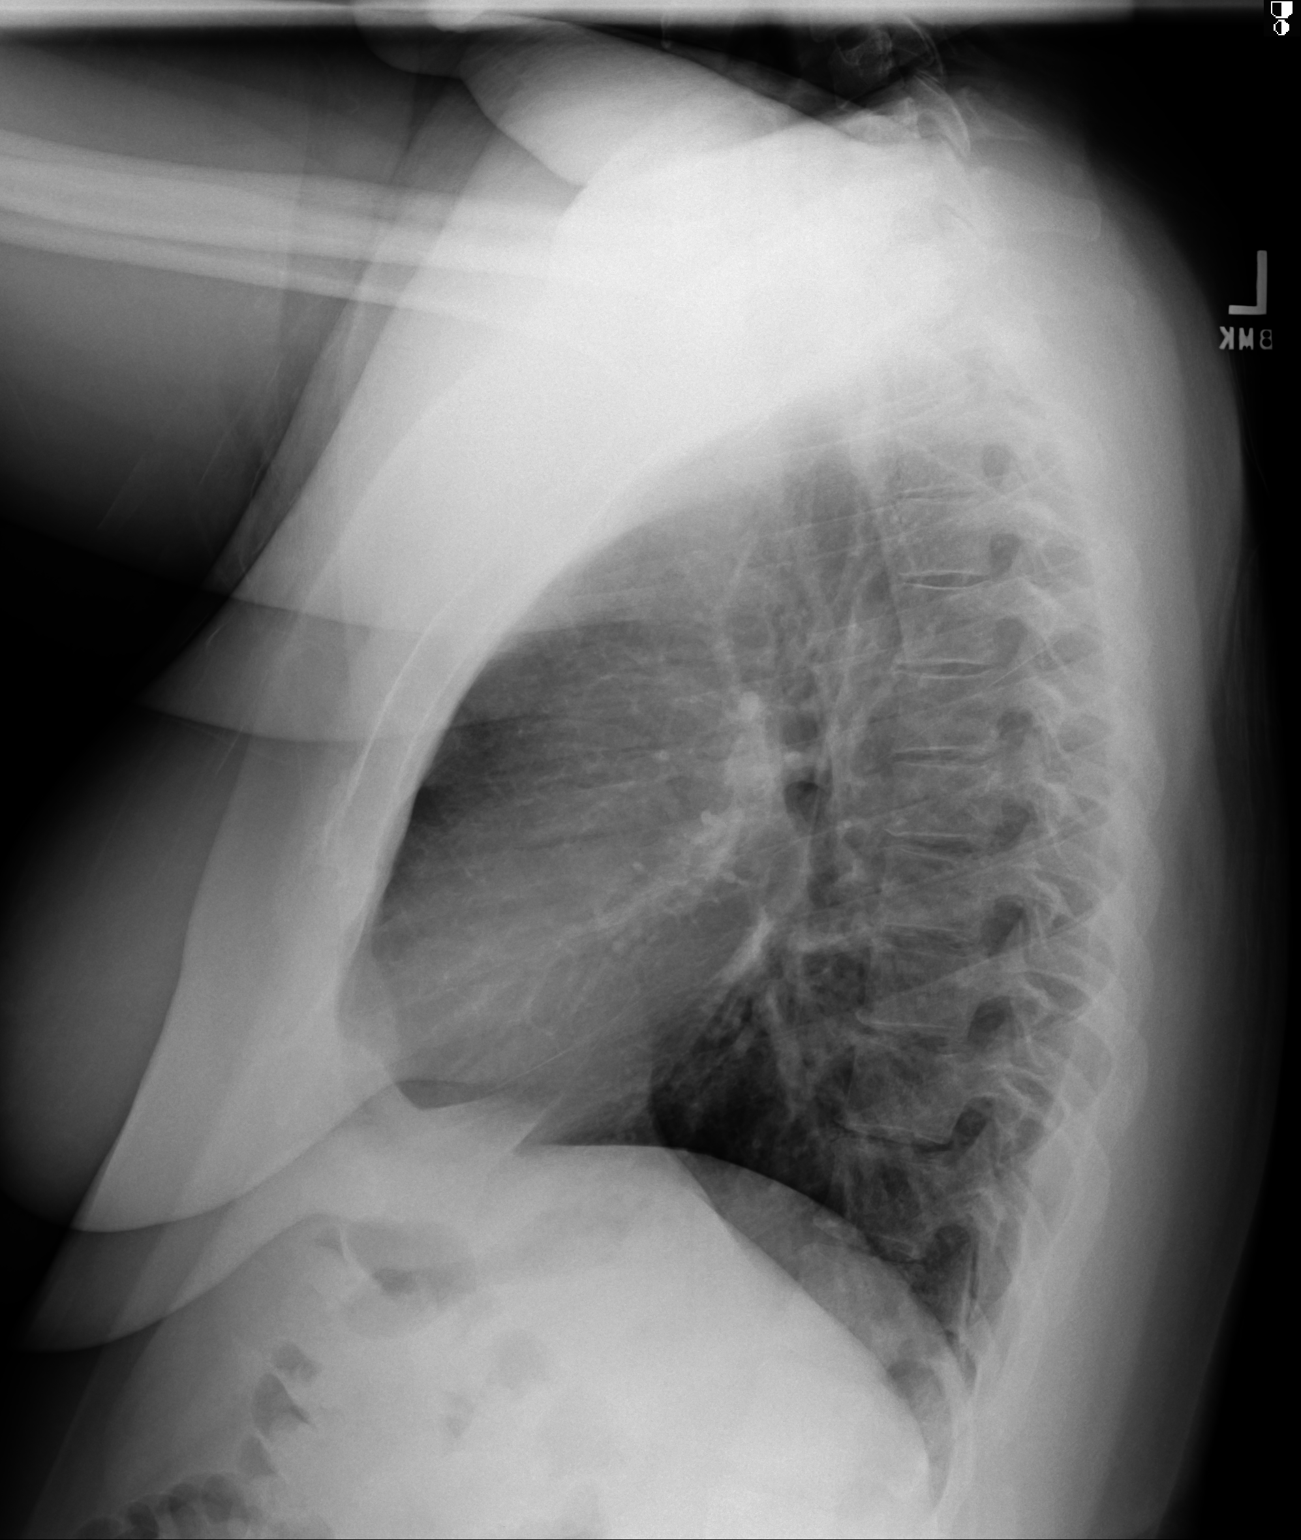

[2 of 2 positions shown; findings below may reference images not displayed]

PROCEDURE:     DXR - DXR CHEST PA (OR AP) AND LATERAL  - [DATE] [DATE]

RESULT:     Comparison is made to the

Dictation Site: [G1] [DATE].

The lungs are well-expanded. There is no focal infiltrate. The cardiac
silhouette is normal in size. The mediastinum is normal in width. There is
no pleural effusion or pneumothorax or pneumomediastinum.
IMPRESSION: There is no evidence of acute cardiopulmonary abnormality.

[REDACTED]

## 2012-10-30 ENCOUNTER — Observation Stay: Payer: Self-pay | Admitting: Surgery

## 2012-10-30 LAB — TROPONIN I: Troponin-I: <0.02 ng/mL

## 2012-10-30 LAB — CBC
HCT: 36.1 % (ref 35.0–47.0)
HGB: 12.4 g/dL (ref 12.0–16.0)
MCH: 30.3 pg (ref 26.0–34.0)
MCHC: 34.3 g/dL (ref 32.0–36.0)
MCV: 88 fL (ref 80–100)
Platelet: 266 10*3/uL (ref 150–440)
RBC: 4.09 10*6/uL (ref 3.80–5.20)
RDW: 13.1 % (ref 11.5–14.5)
WBC: 8.3 10*3/uL (ref 3.6–11.0)

## 2012-10-30 LAB — COMPREHENSIVE METABOLIC PANEL
Albumin: 3.9 g/dL (ref 3.4–5.0)
Alkaline Phosphatase: 42 U/L — ABNORMAL LOW (ref 50–136)
Anion Gap: 9 (ref 7–16)
BUN: 9 mg/dL (ref 7–18)
Bilirubin,Total: 0.4 mg/dL (ref 0.2–1.0)
Calcium, Total: 8.9 mg/dL (ref 8.5–10.1)
Chloride: 106 mmol/L (ref 98–107)
Co2: 25 mmol/L (ref 21–32)
Creatinine: 0.72 mg/dL (ref 0.60–1.30)
EGFR (African American): >60
EGFR (Non-African Amer.): >60
Glucose: 97 mg/dL (ref 65–99)
Osmolality: 278 (ref 275–301)
Potassium: 3.6 mmol/L (ref 3.5–5.1)
SGOT(AST): 13 U/L — ABNORMAL LOW (ref 15–37)
SGPT (ALT): 25 U/L (ref 12–78)
Sodium: 140 mmol/L (ref 136–145)
Total Protein: 7.4 g/dL (ref 6.4–8.2)

## 2012-10-30 LAB — PROTIME-INR
INR: 0.9
Prothrombin Time: 12.8 secs (ref 11.5–14.7)

## 2012-10-30 LAB — AMYLASE: Amylase: 64 U/L (ref 25–115)

## 2012-10-30 LAB — LIPASE, BLOOD: Lipase: 233 U/L (ref 73–393)

## 2012-10-30 LAB — APTT: Activated PTT: 31.2 secs (ref 23.6–35.9)

## 2012-10-30 IMAGING — CR DG CHEST 2V
1 series · 2 of 2 positions shown · non-contrast
Comparison: none

REASON FOR EXAM: chest pain
COMMENTS:   May transport without cardiac monitor

[Series 1: pa · 0.17mm/px · 2 of 2 slices shown]
[im 1/2]
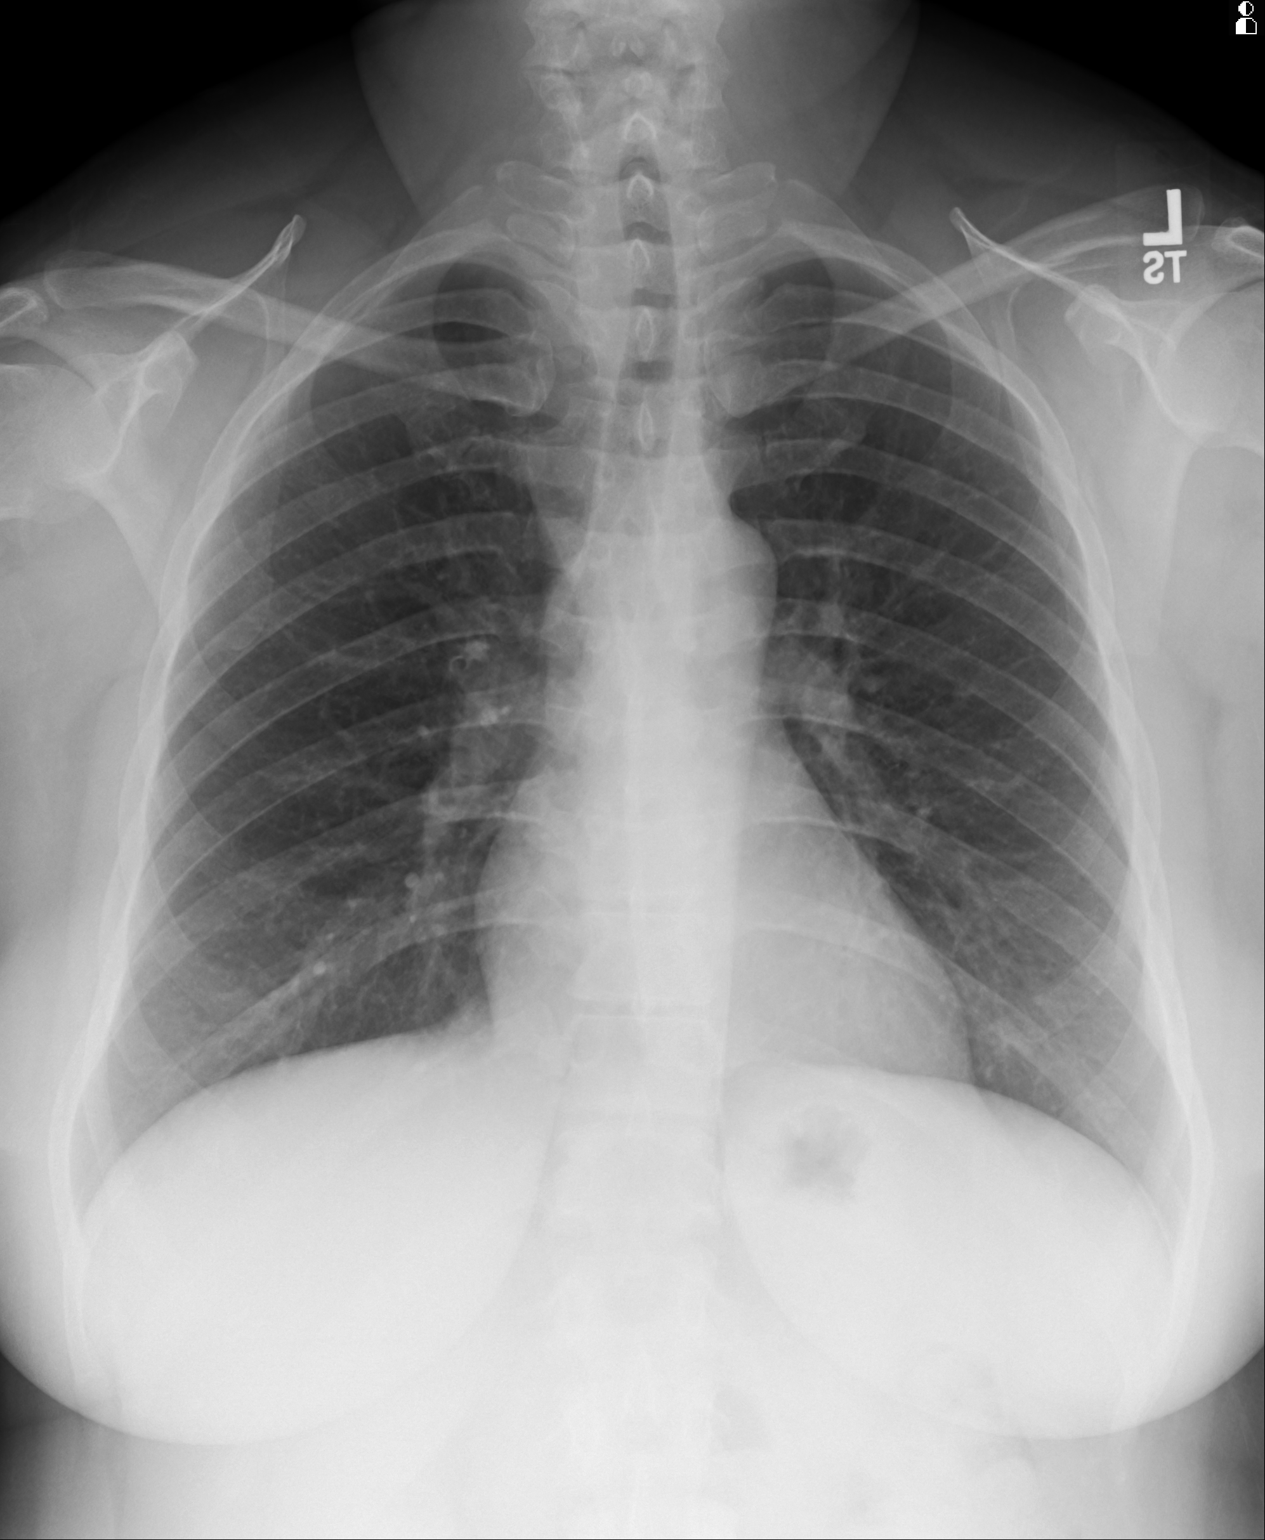
[im 2/2]
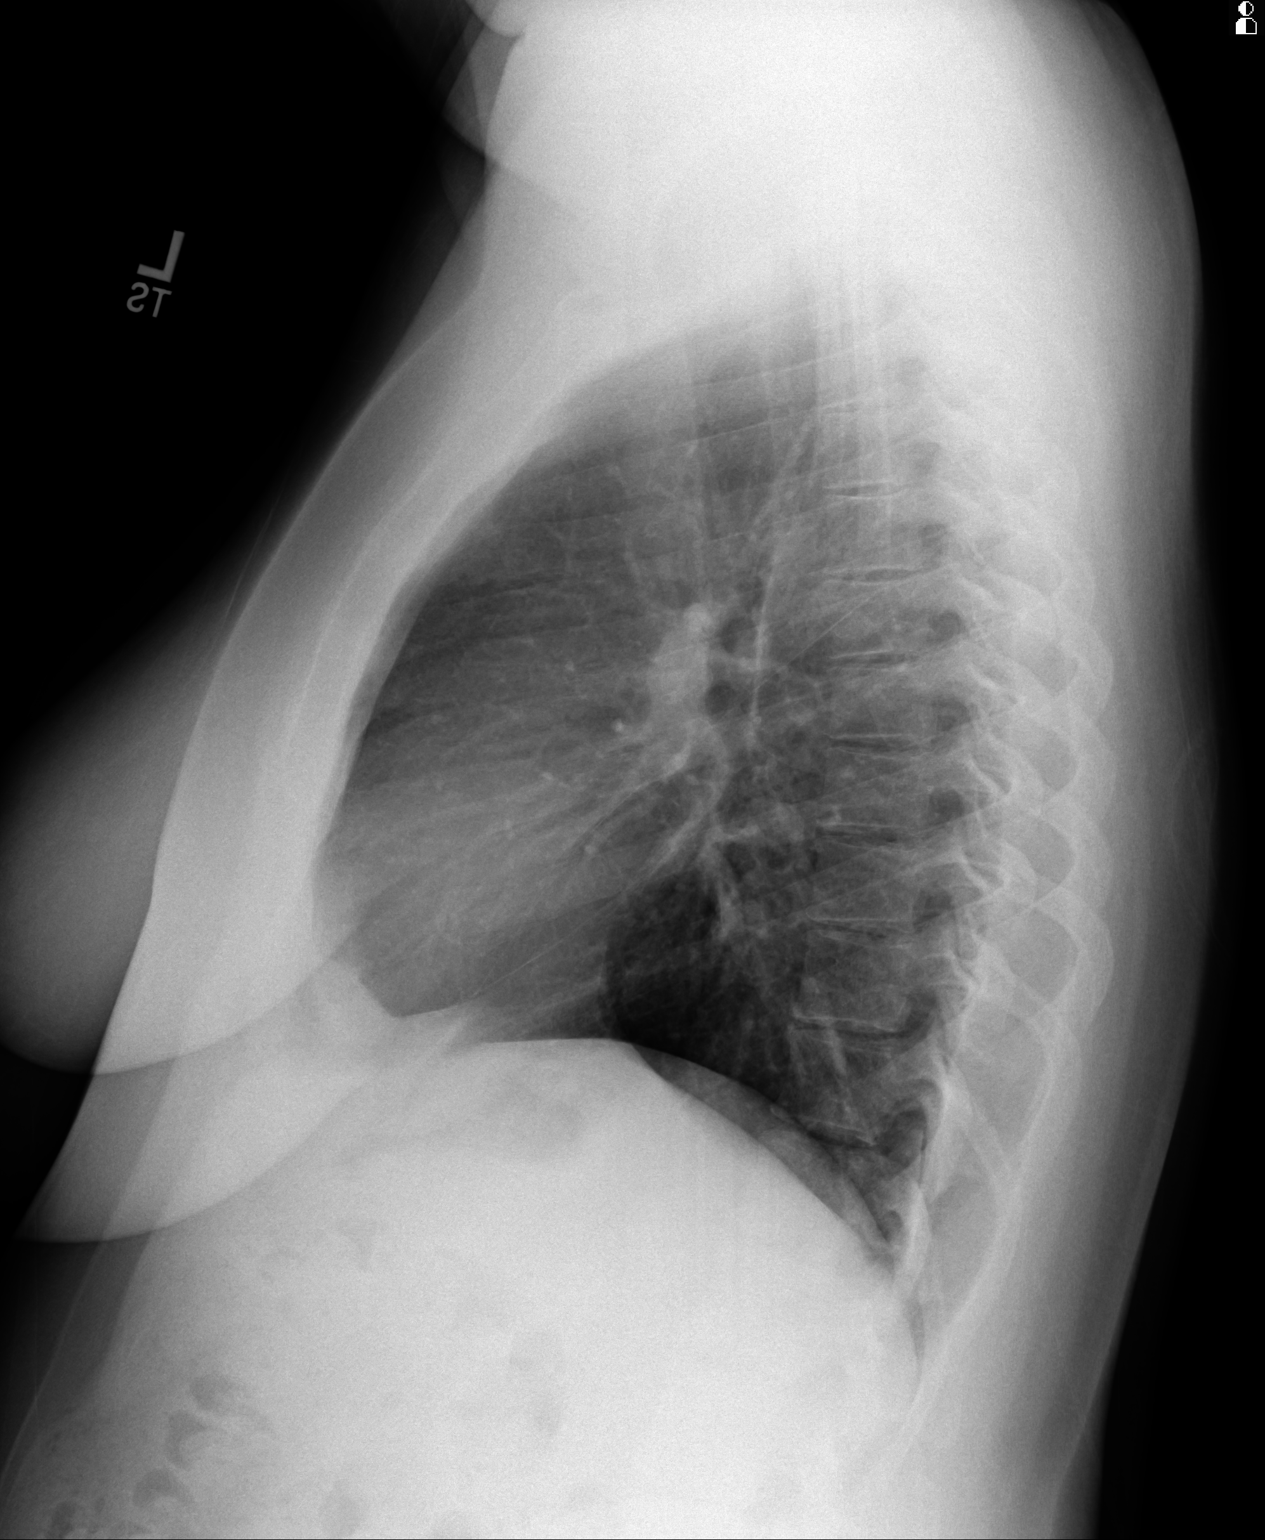

[2 of 2 positions shown; findings below may reference images not displayed]

PROCEDURE:     DXR - DXR CHEST PA (OR AP) AND LATERAL  - [DATE] [DATE]

RESULT:     Comparison is made to the previous examination dated [DATE]. The lungs are clear. The heart and pulmonary vessels are normal. The
bony and mediastinal structures are unremarkable. There is no effusion.
There is no pneumothorax or evidence of congestive failure.
IMPRESSION: No acute cardiopulmonary disease.

[REDACTED]

## 2012-10-30 IMAGING — US ABDOMEN ULTRASOUND LIMITED
1 series · 14 of 25 positions shown · non-contrast
Comparison: none

REASON FOR EXAM: h/o gallstones, RUQ pain, nausea concern for
cholecystitis
COMMENTS:   Body Site: GB and Fossa, CBD, Head of Pancreas

PROCEDURE:     US  - US ABDOMEN LIMITED SURVEY  - [DATE]  [DATE]
RESULT:     Comparison: None
TECHNIQUE: Multiple gray-scale and color-flow Doppler images of the right
upper quadrant are presented for review.

[Series 1: abdomen ultrasound limited · 0.31mm/px · 14 of 46 slices shown]
[im 1/46]
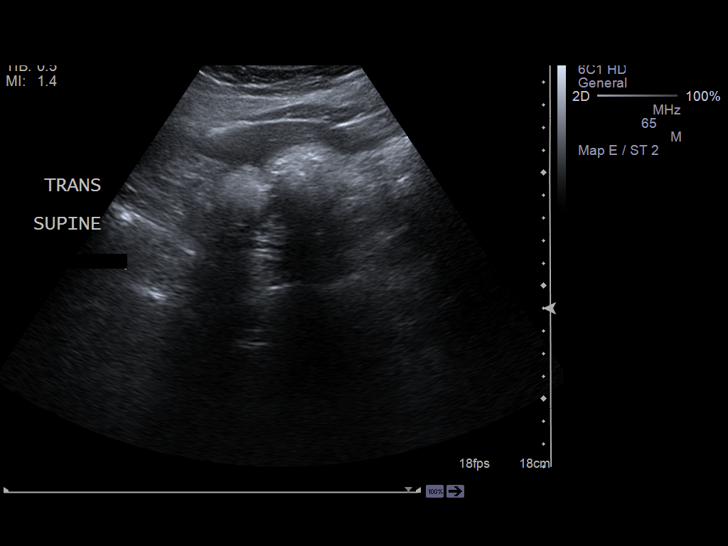
[im 4/46]
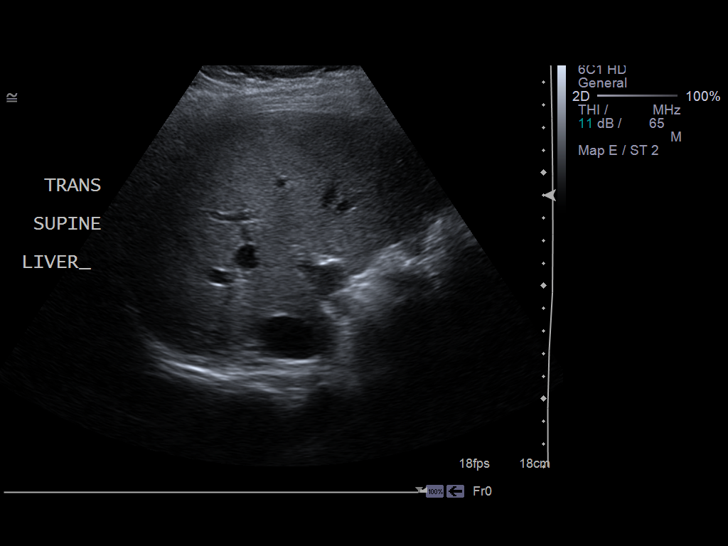
[im 8/46]
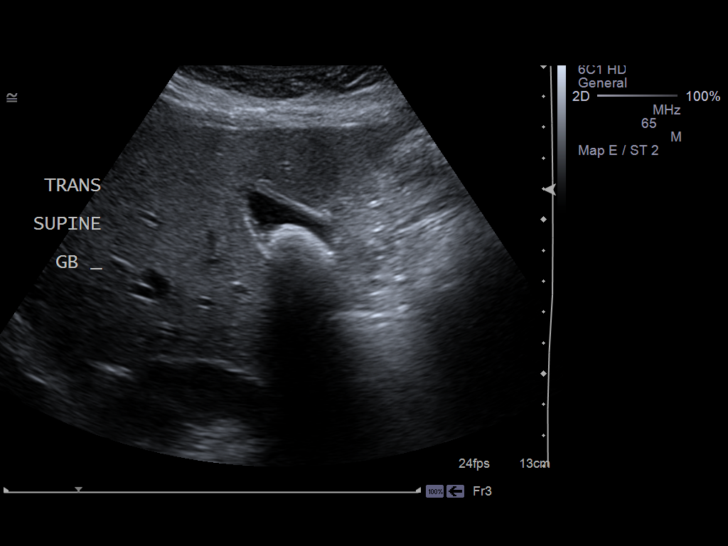
[im 12/46]
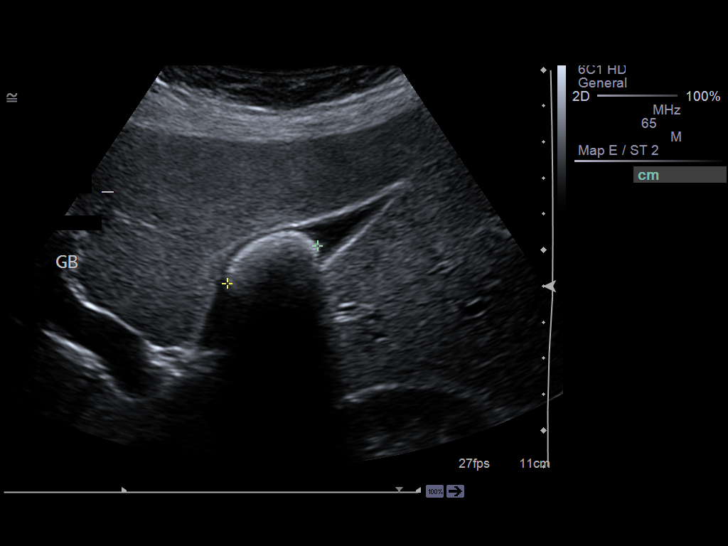
[im 16/46]
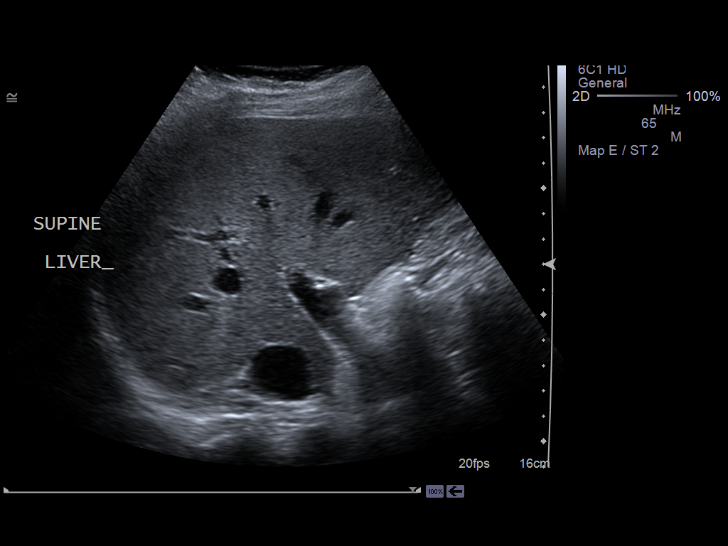
[im 17/46]
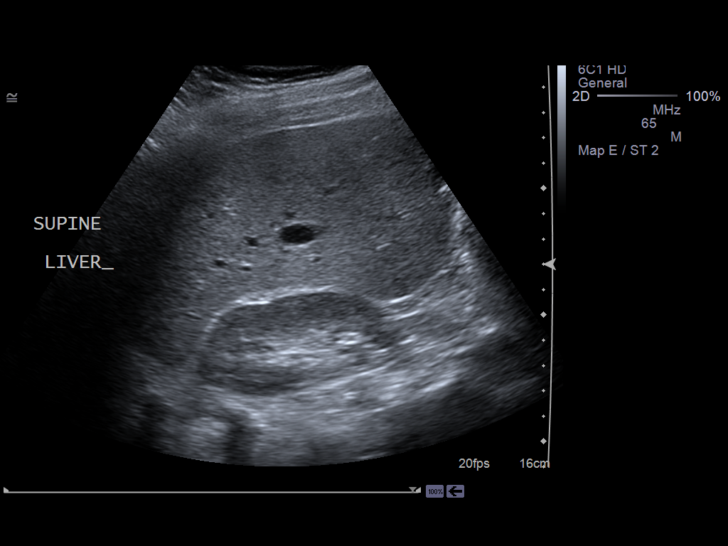
[im 21/46]
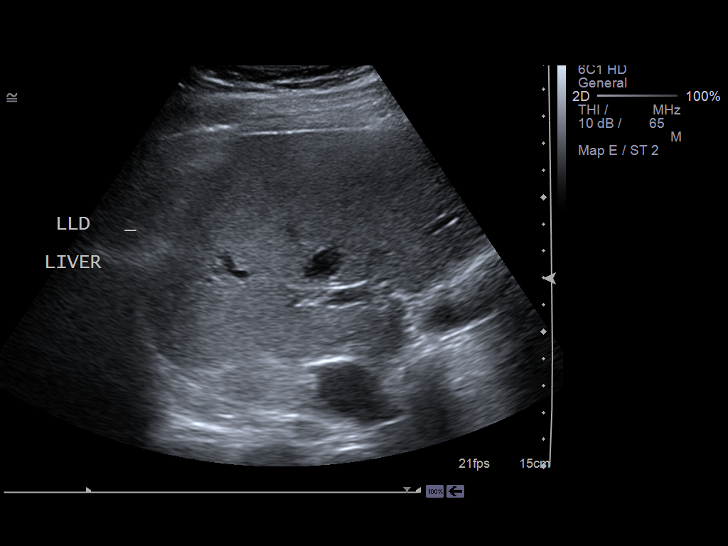
[im 25/46]
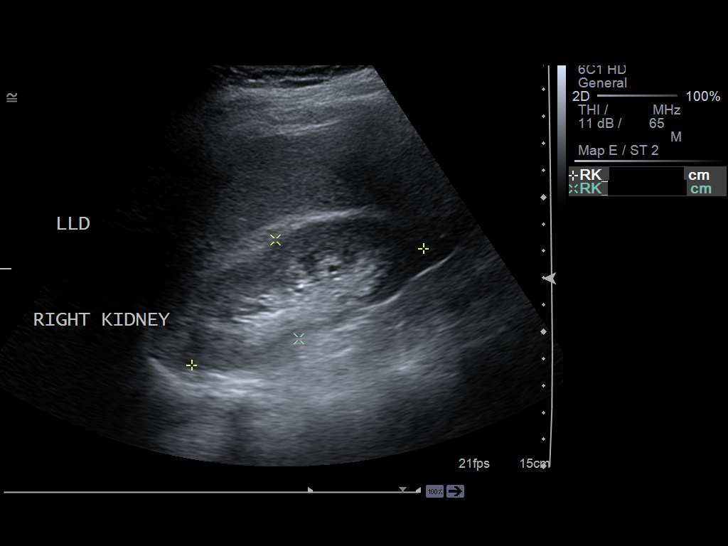
[im 29/46]
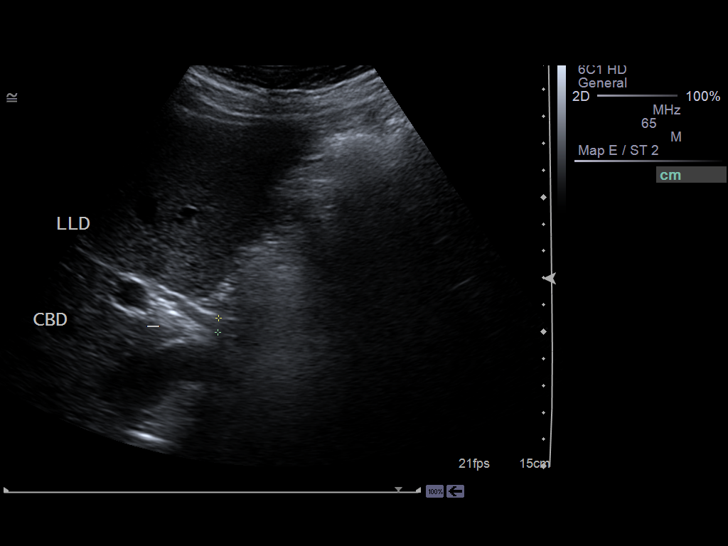
[im 31/46]
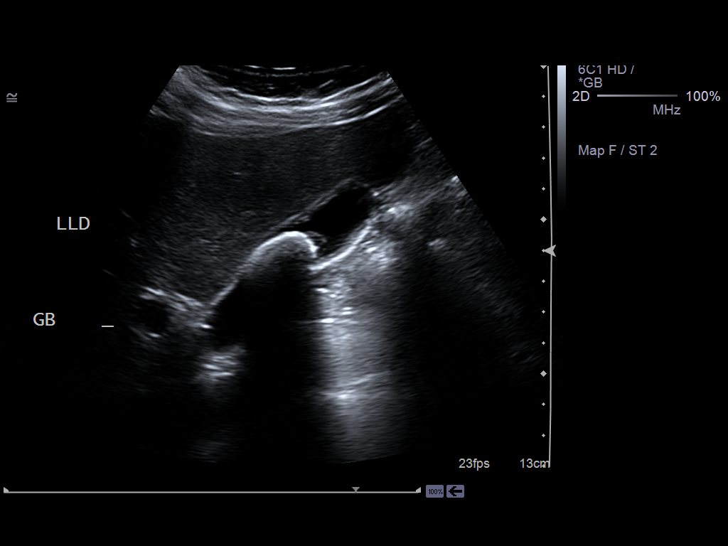
[im 34/46]
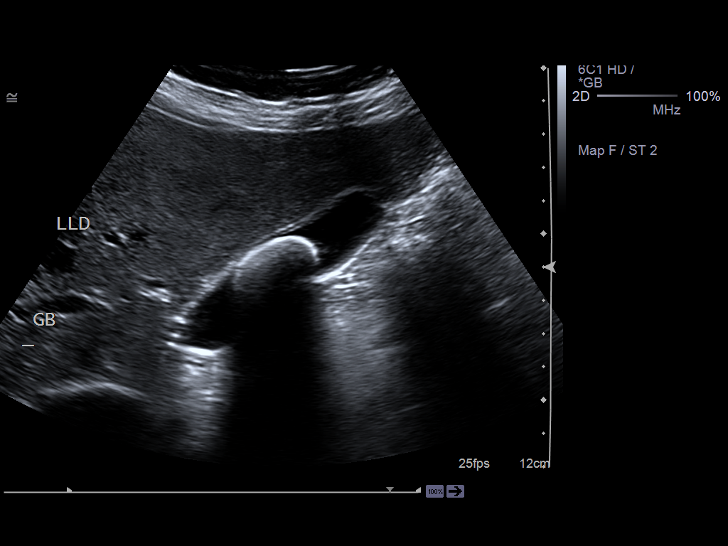
[im 38/46]
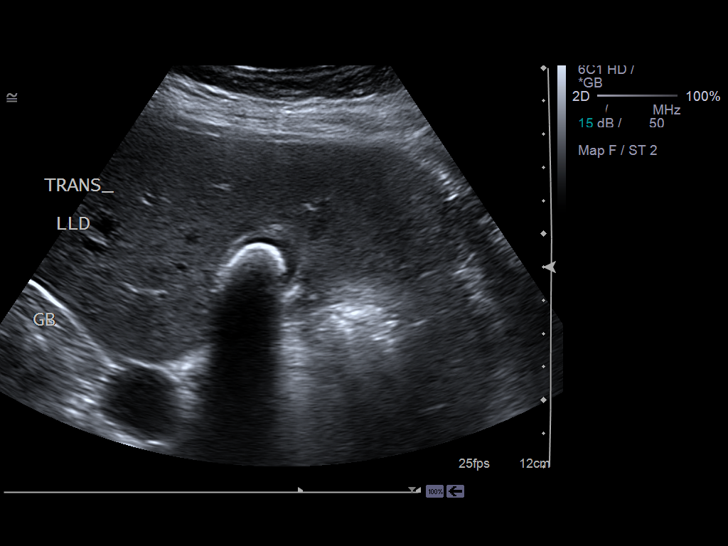
[im 42/46]
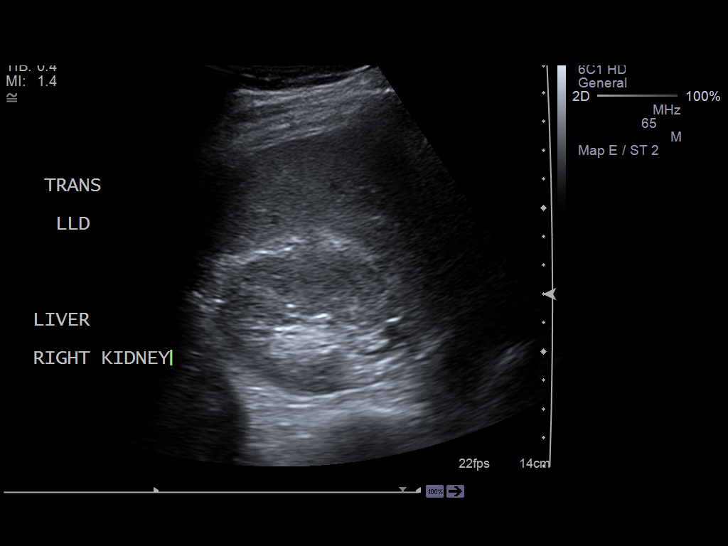
[im 46/46]
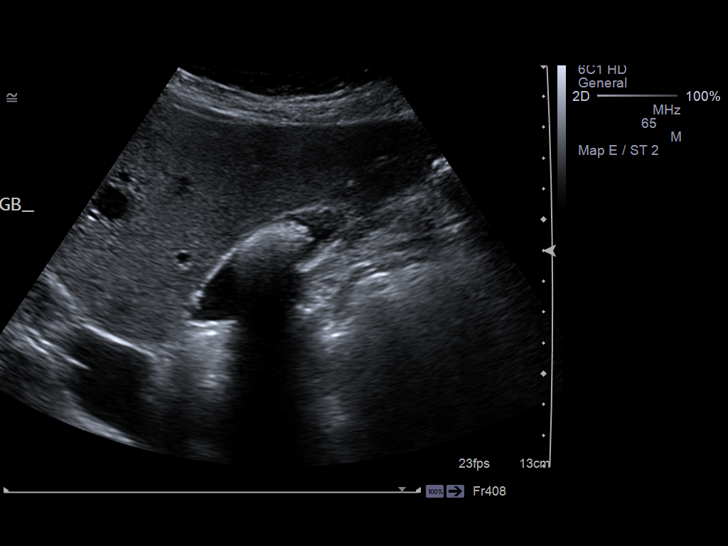

[14 of 25 positions shown; findings below may reference images not displayed]

FINDINGS: Visualized portions of the liver demonstrate normal echogenicity and normal
contours. The liver is without evidence of a focal hepatic lesion.

There are cholelithiasis. There is no intra- or extrahepatic biliary ductal
dilatation. The common duct measures 5.3 mm in maximal diameter. There is no
gallbladder wall thickening, pericholecystic fluid, or sonographic Murphy's
sign.

The pancreas is not visualized secondary to overlying bowel gas.
IMPRESSION: Cholelithiasis without sonographic evidence of acute cholecystitis. If there
is further clinical concern recommend a HIDA scan.

[REDACTED]

## 2012-11-04 LAB — PATHOLOGY REPORT

## 2012-11-12 ENCOUNTER — Emergency Department: Payer: Self-pay

## 2012-11-12 LAB — COMPREHENSIVE METABOLIC PANEL
Albumin: 4.1 g/dL (ref 3.4–5.0)
Alkaline Phosphatase: 44 U/L — ABNORMAL LOW (ref 50–136)
Anion Gap: 6 — ABNORMAL LOW (ref 7–16)
BUN: 10 mg/dL (ref 7–18)
Bilirubin,Total: 0.4 mg/dL (ref 0.2–1.0)
Calcium, Total: 9.2 mg/dL (ref 8.5–10.1)
Chloride: 107 mmol/L (ref 98–107)
Co2: 27 mmol/L (ref 21–32)
Creatinine: 0.69 mg/dL (ref 0.60–1.30)
EGFR (African American): >60
EGFR (Non-African Amer.): >60
Glucose: 95 mg/dL (ref 65–99)
Osmolality: 278 (ref 275–301)
Potassium: 3.9 mmol/L (ref 3.5–5.1)
SGOT(AST): 19 U/L (ref 15–37)
SGPT (ALT): 28 U/L (ref 12–78)
Sodium: 140 mmol/L (ref 136–145)
Total Protein: 8.2 g/dL (ref 6.4–8.2)

## 2012-11-12 LAB — TROPONIN I: Troponin-I: <0.02 ng/mL

## 2012-11-12 LAB — CBC
HCT: 36.5 % (ref 35.0–47.0)
HGB: 12.7 g/dL (ref 12.0–16.0)
MCH: 30.5 pg (ref 26.0–34.0)
MCHC: 34.8 g/dL (ref 32.0–36.0)
MCV: 88 fL (ref 80–100)
Platelet: 292 10*3/uL (ref 150–440)
RBC: 4.16 10*6/uL (ref 3.80–5.20)
RDW: 13.1 % (ref 11.5–14.5)
WBC: 7.8 10*3/uL (ref 3.6–11.0)

## 2012-11-12 LAB — CK TOTAL AND CKMB (NOT AT ARMC)
CK, Total: 105 U/L (ref 21–215)
CK-MB: <0.5 ng/mL — ABNORMAL LOW (ref 0.5–3.6)

## 2012-12-02 ENCOUNTER — Emergency Department: Payer: Self-pay | Admitting: Emergency Medicine

## 2012-12-02 LAB — COMPREHENSIVE METABOLIC PANEL
Albumin: 4.1 g/dL (ref 3.4–5.0)
Alkaline Phosphatase: 41 U/L — ABNORMAL LOW (ref 50–136)
Anion Gap: 4 — ABNORMAL LOW (ref 7–16)
BUN: 10 mg/dL (ref 7–18)
Bilirubin,Total: 0.5 mg/dL (ref 0.2–1.0)
Calcium, Total: 9.1 mg/dL (ref 8.5–10.1)
Chloride: 109 mmol/L — ABNORMAL HIGH (ref 98–107)
Co2: 25 mmol/L (ref 21–32)
Creatinine: 0.54 mg/dL — ABNORMAL LOW (ref 0.60–1.30)
EGFR (African American): >60
EGFR (Non-African Amer.): >60
Glucose: 97 mg/dL (ref 65–99)
Osmolality: 275 (ref 275–301)
Potassium: 3.9 mmol/L (ref 3.5–5.1)
SGOT(AST): 22 U/L (ref 15–37)
SGPT (ALT): 29 U/L (ref 12–78)
Sodium: 138 mmol/L (ref 136–145)
Total Protein: 7.9 g/dL (ref 6.4–8.2)

## 2012-12-02 LAB — CBC
HCT: 38 % (ref 35.0–47.0)
HGB: 12.9 g/dL (ref 12.0–16.0)
MCH: 30.4 pg (ref 26.0–34.0)
MCHC: 33.9 g/dL (ref 32.0–36.0)
MCV: 90 fL (ref 80–100)
Platelet: 234 10*3/uL (ref 150–440)
RBC: 4.24 10*6/uL (ref 3.80–5.20)
RDW: 13.2 % (ref 11.5–14.5)
WBC: 6.6 10*3/uL (ref 3.6–11.0)

## 2012-12-02 IMAGING — CT CT HEAD WITHOUT CONTRAST
1 series · 16 of 29 positions shown, 20 images · non-contrast
Comparison: none

REASON FOR EXAM: severe headache, sudden onset
COMMENTS:

PROCEDURE:     CT  - CT HEAD WITHOUT CONTRAST  - [DATE] [DATE]
RESULT:     Comparison:  [DATE]
TECHNIQUE: Multiple axial images from the foramen magnum to the vertex were
obtained without IV contrast.

[Series 2: soft tissue · axial · 0.40mm/px · z∈[+130,+260]mm · 16 of 29 slices shown, 20 images]
[im 2/29  brain]
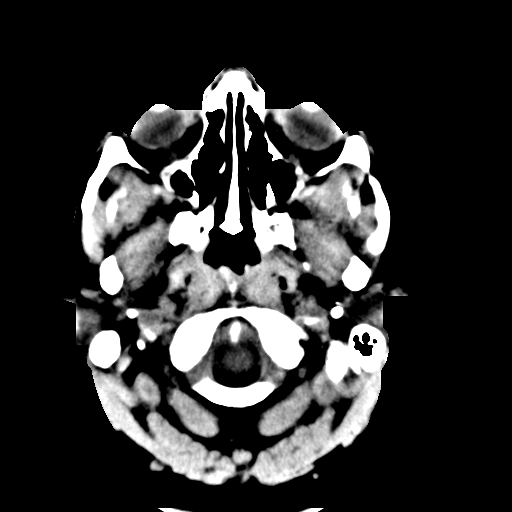
[im 2/29  bone]
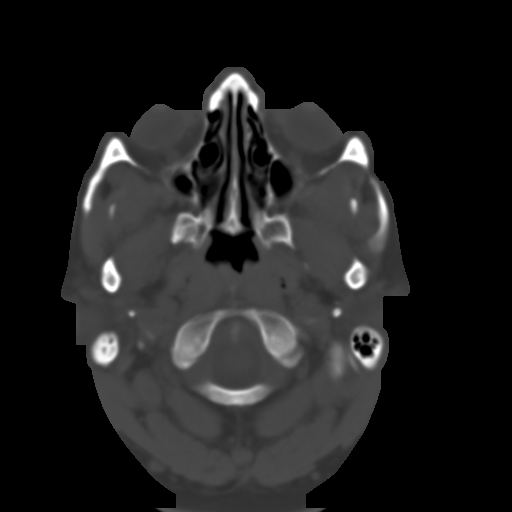
[im 4/29  brain]
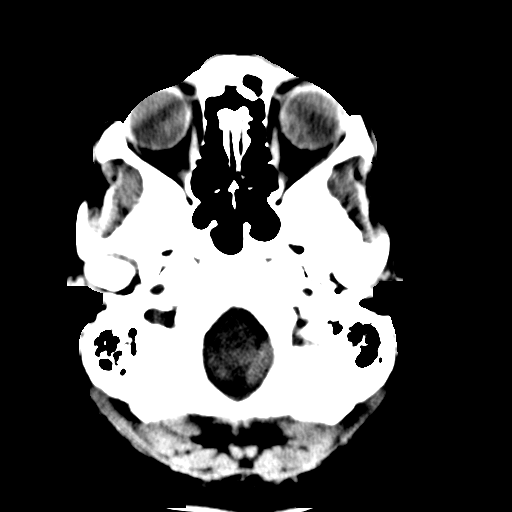
[im 6/29  brain]
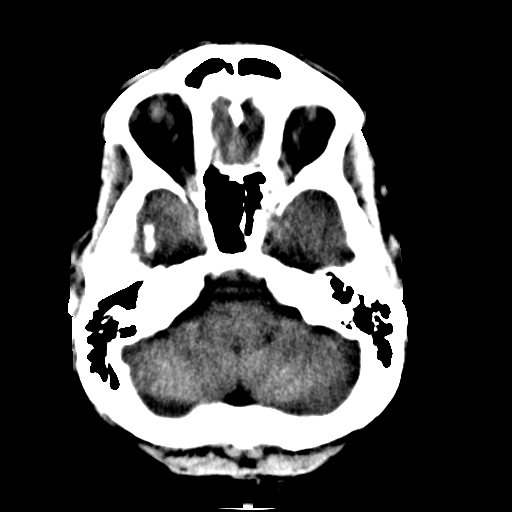
[im 7/29  brain]
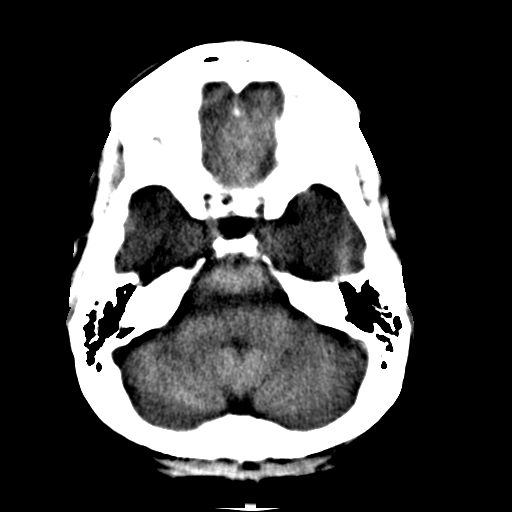
[im 9/29  brain]
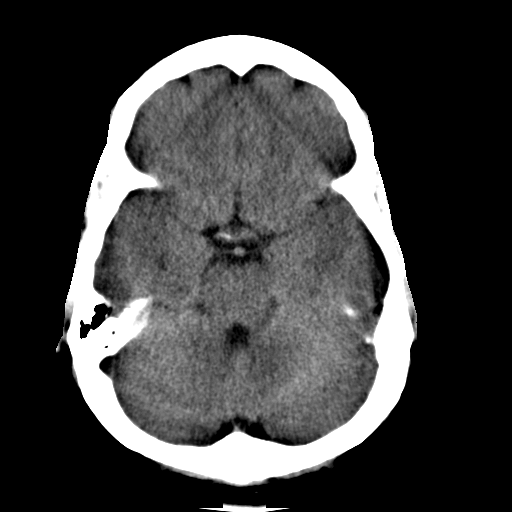
[im 9/29  bone]
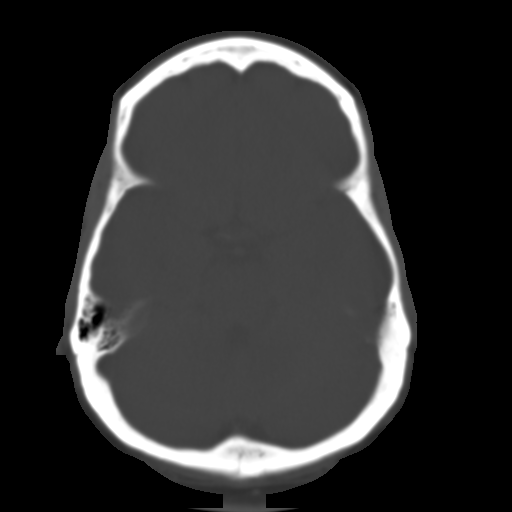
[im 11/29  brain]
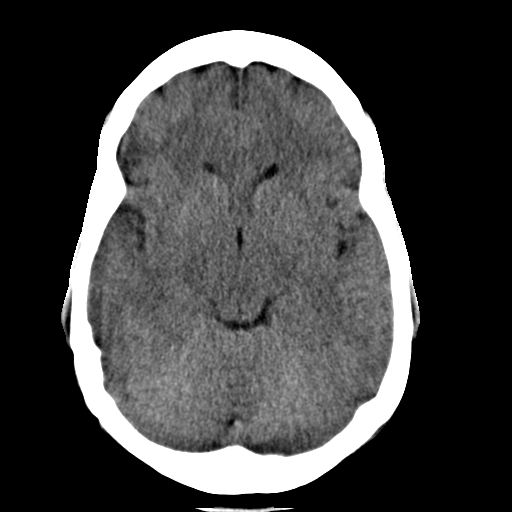
[im 12/29  brain]
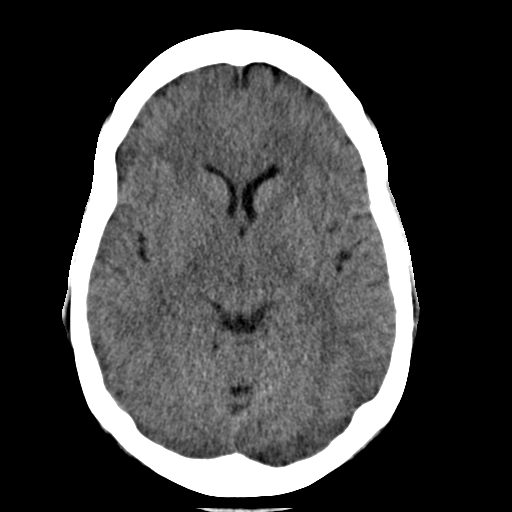
[im 14/29  brain]
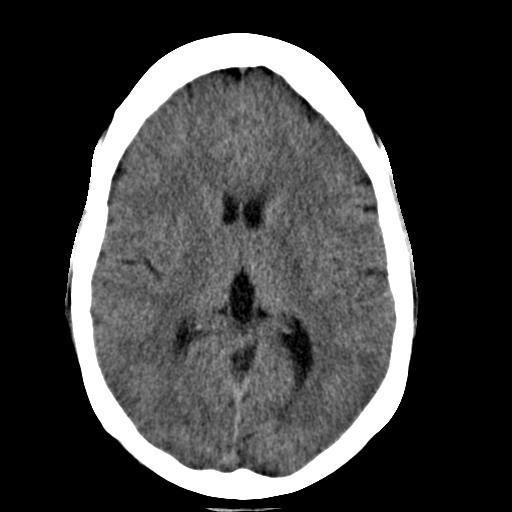
[im 16/29  brain]
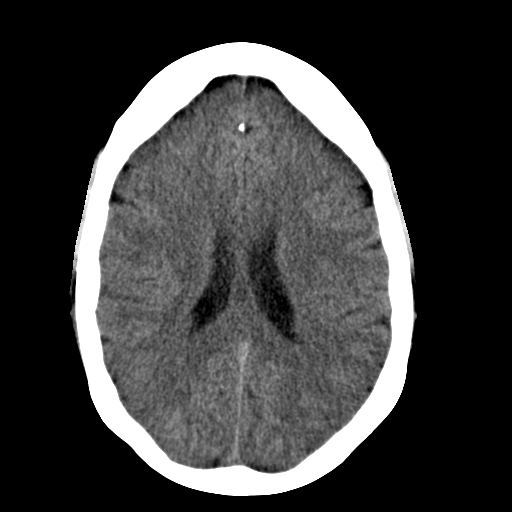
[im 16/29  bone]
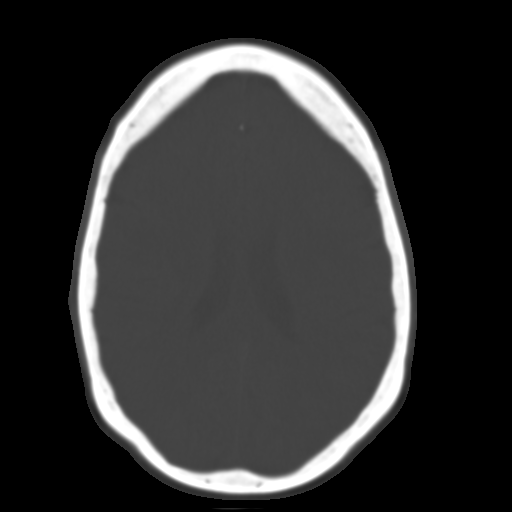
[im 18/29  brain]
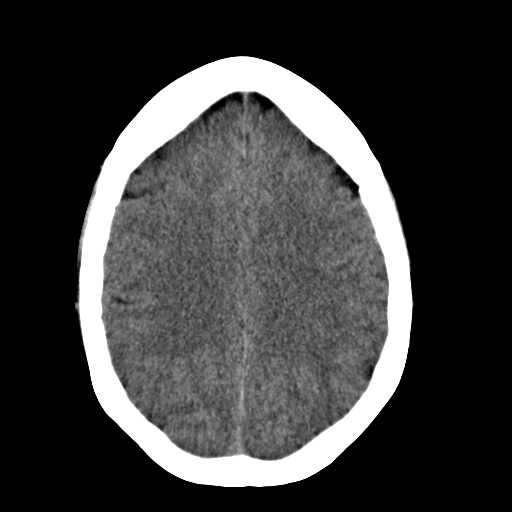
[im 19/29  brain]
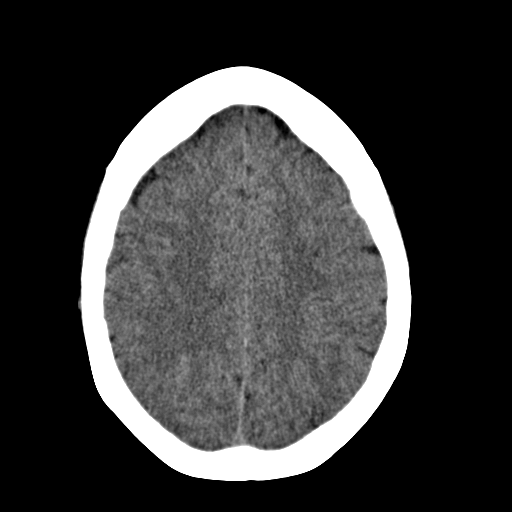
[im 21/29  brain]
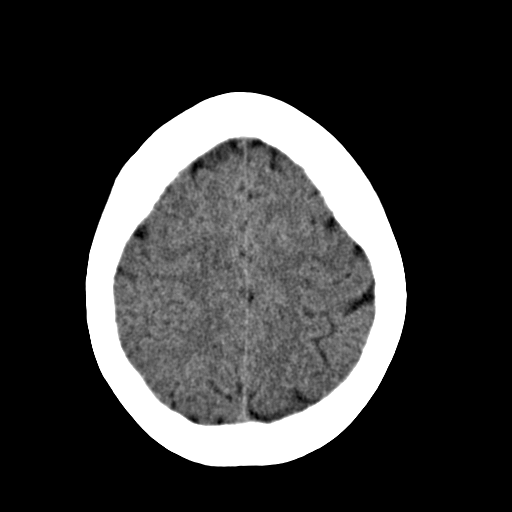
[im 23/29  brain]
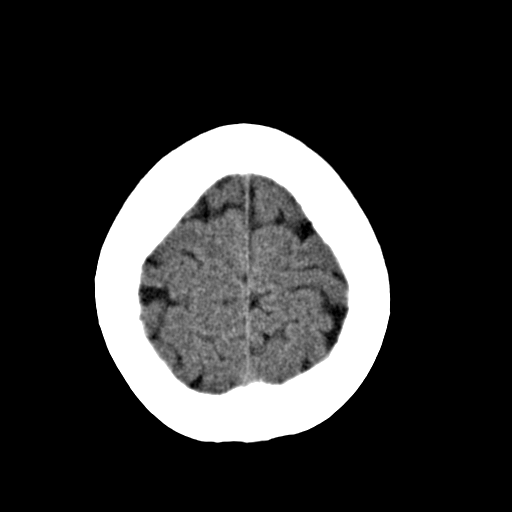
[im 23/29  bone]
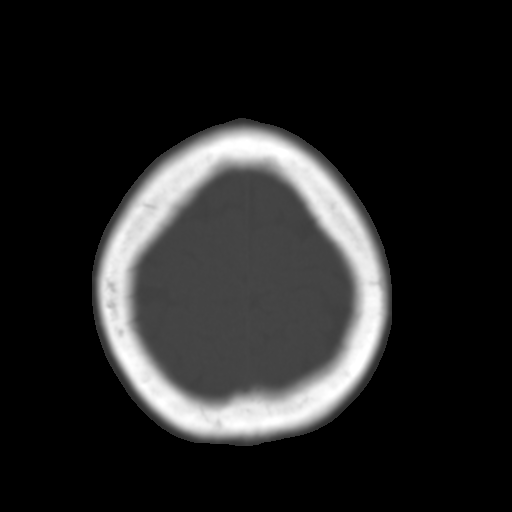
[im 24/29  brain]
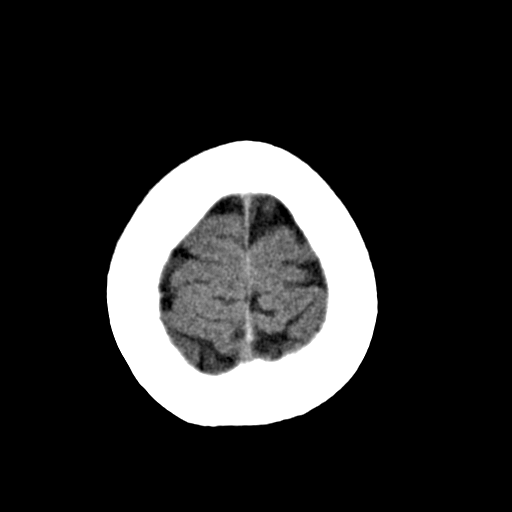
[im 26/29  brain]
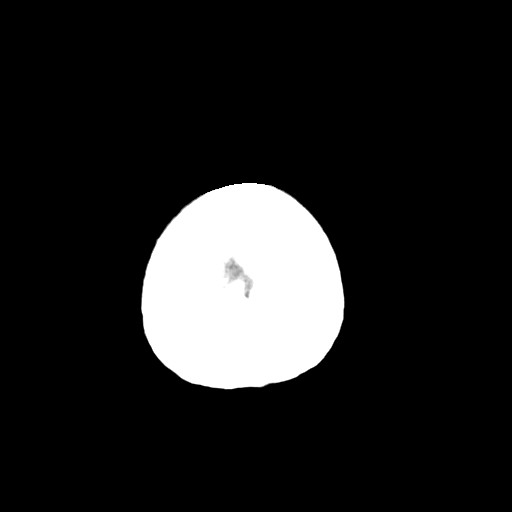
[im 28/29  brain]
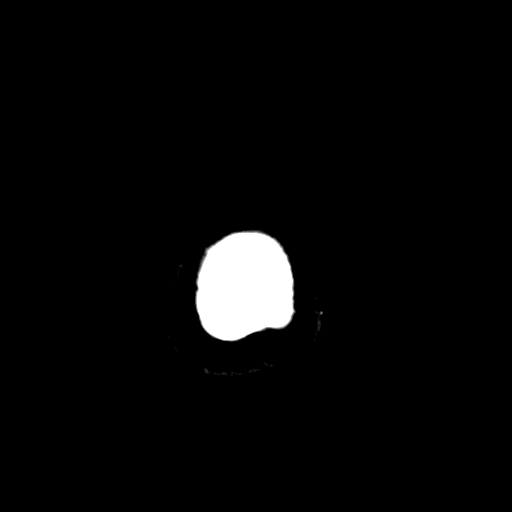

[16 of 29 positions shown; findings below may reference images not displayed]

FINDINGS: There is no evidence for mass effect, midline shift, or extra-axial fluid
collections. There is no evidence for space-occupying lesion, intracranial
hemorrhage, or cortical-based area of infarction.

The osseous structures are unremarkable.
IMPRESSION: No acute intracranial process.

## 2012-12-03 LAB — CSF CELL COUNT WITH DIFFERENTIAL
CSF Tube #: 1
CSF Tube #: 3
Eosinophil: 0 %
Eosinophil: 0 %
Lymphocytes: 0 %
Lymphocytes: 80 %
Monocytes/Macrophages: 0 %
Monocytes/Macrophages: 20 %
Neutrophils: 0 %
Neutrophils: 0 %
Other Cells: 0 %
Other Cells: 0 %
RBC (CSF): 0 /mm3
RBC (CSF): 12 /mm3
WBC (CSF): 0 /mm3
WBC (CSF): 5 /mm3

## 2012-12-03 LAB — GLUCOSE, CSF: Glucose, CSF: 51 mg/dL (ref 40–75)

## 2012-12-03 LAB — PROTEIN, CSF: Protein, CSF: 33 mg/dL (ref 15–45)

## 2012-12-06 LAB — CSF CULTURE W GRAM STAIN

## 2012-12-15 ENCOUNTER — Emergency Department: Payer: Self-pay | Admitting: Emergency Medicine

## 2012-12-15 LAB — BASIC METABOLIC PANEL
Anion Gap: 5 — ABNORMAL LOW (ref 7–16)
BUN: 10 mg/dL (ref 7–18)
Calcium, Total: 8.7 mg/dL (ref 8.5–10.1)
Chloride: 106 mmol/L (ref 98–107)
Co2: 27 mmol/L (ref 21–32)
Creatinine: 0.7 mg/dL (ref 0.60–1.30)
EGFR (African American): >60
EGFR (Non-African Amer.): >60
Glucose: 89 mg/dL (ref 65–99)
Osmolality: 274 (ref 275–301)
Potassium: 3.7 mmol/L (ref 3.5–5.1)
Sodium: 138 mmol/L (ref 136–145)

## 2012-12-15 LAB — CBC
HCT: 34.1 % — ABNORMAL LOW (ref 35.0–47.0)
HGB: 12.2 g/dL (ref 12.0–16.0)
MCH: 31.6 pg (ref 26.0–34.0)
MCHC: 35.6 g/dL (ref 32.0–36.0)
MCV: 89 fL (ref 80–100)
Platelet: 280 10*3/uL (ref 150–440)
RBC: 3.84 10*6/uL (ref 3.80–5.20)
RDW: 13.4 % (ref 11.5–14.5)
WBC: 7.6 10*3/uL (ref 3.6–11.0)

## 2012-12-15 LAB — TROPONIN I
Troponin-I: <0.02 ng/mL
Troponin-I: <0.02 ng/mL

## 2012-12-15 IMAGING — CR DG CHEST 2V
1 series · 2 of 2 positions shown · non-contrast
Comparison: none

REASON FOR EXAM: chest pain  - ed waiting room
COMMENTS:   LMP: Two weeks ago

PROCEDURE:     DXR - DXR CHEST PA (OR AP) AND LATERAL  - [DATE]  [DATE]
RESULT:     The lungs are clear. The cardiac silhouette and visualized bony
skeleton are unremarkable.

[Series 1: w chest pa · 0.14mm/px · 2 of 2 slices shown]
[im 1/2]
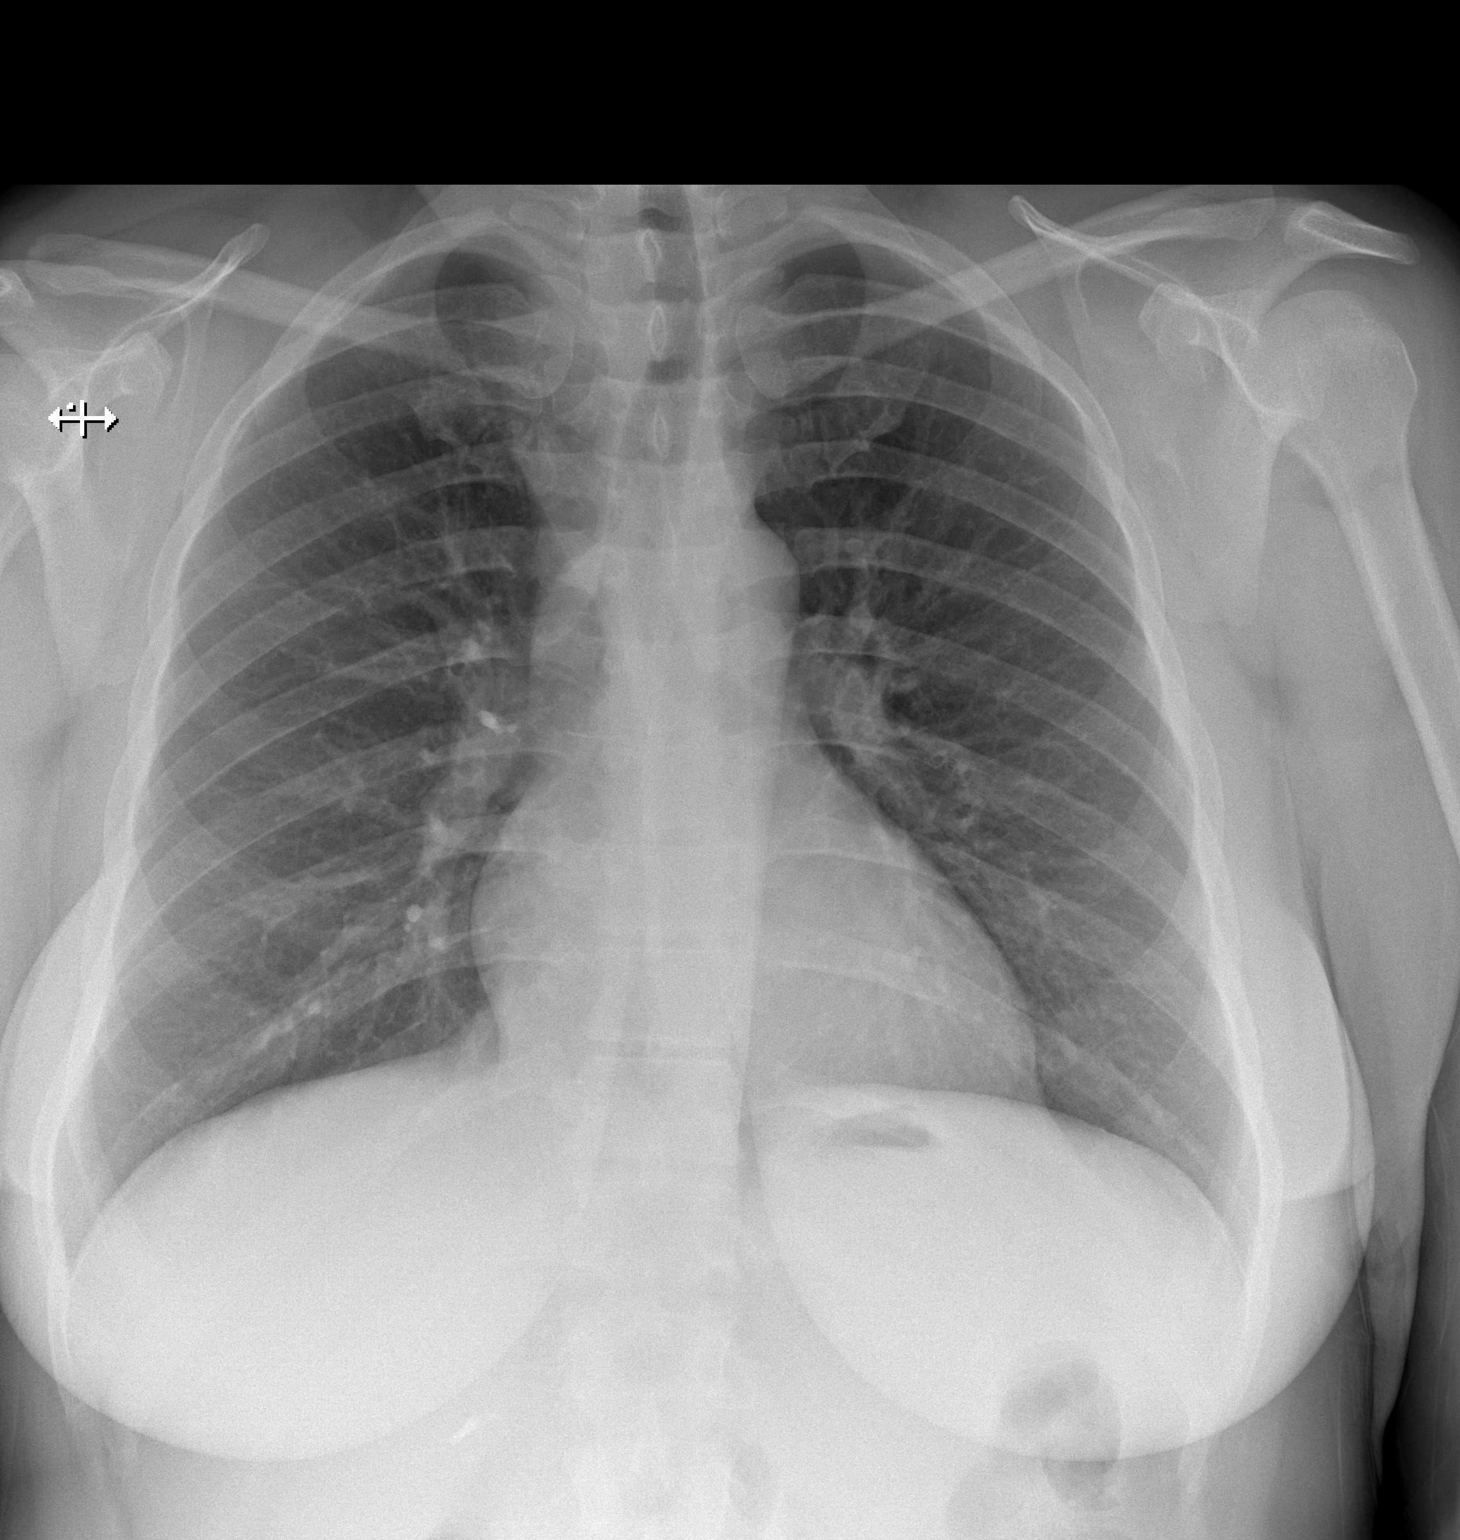
[im 2/2]
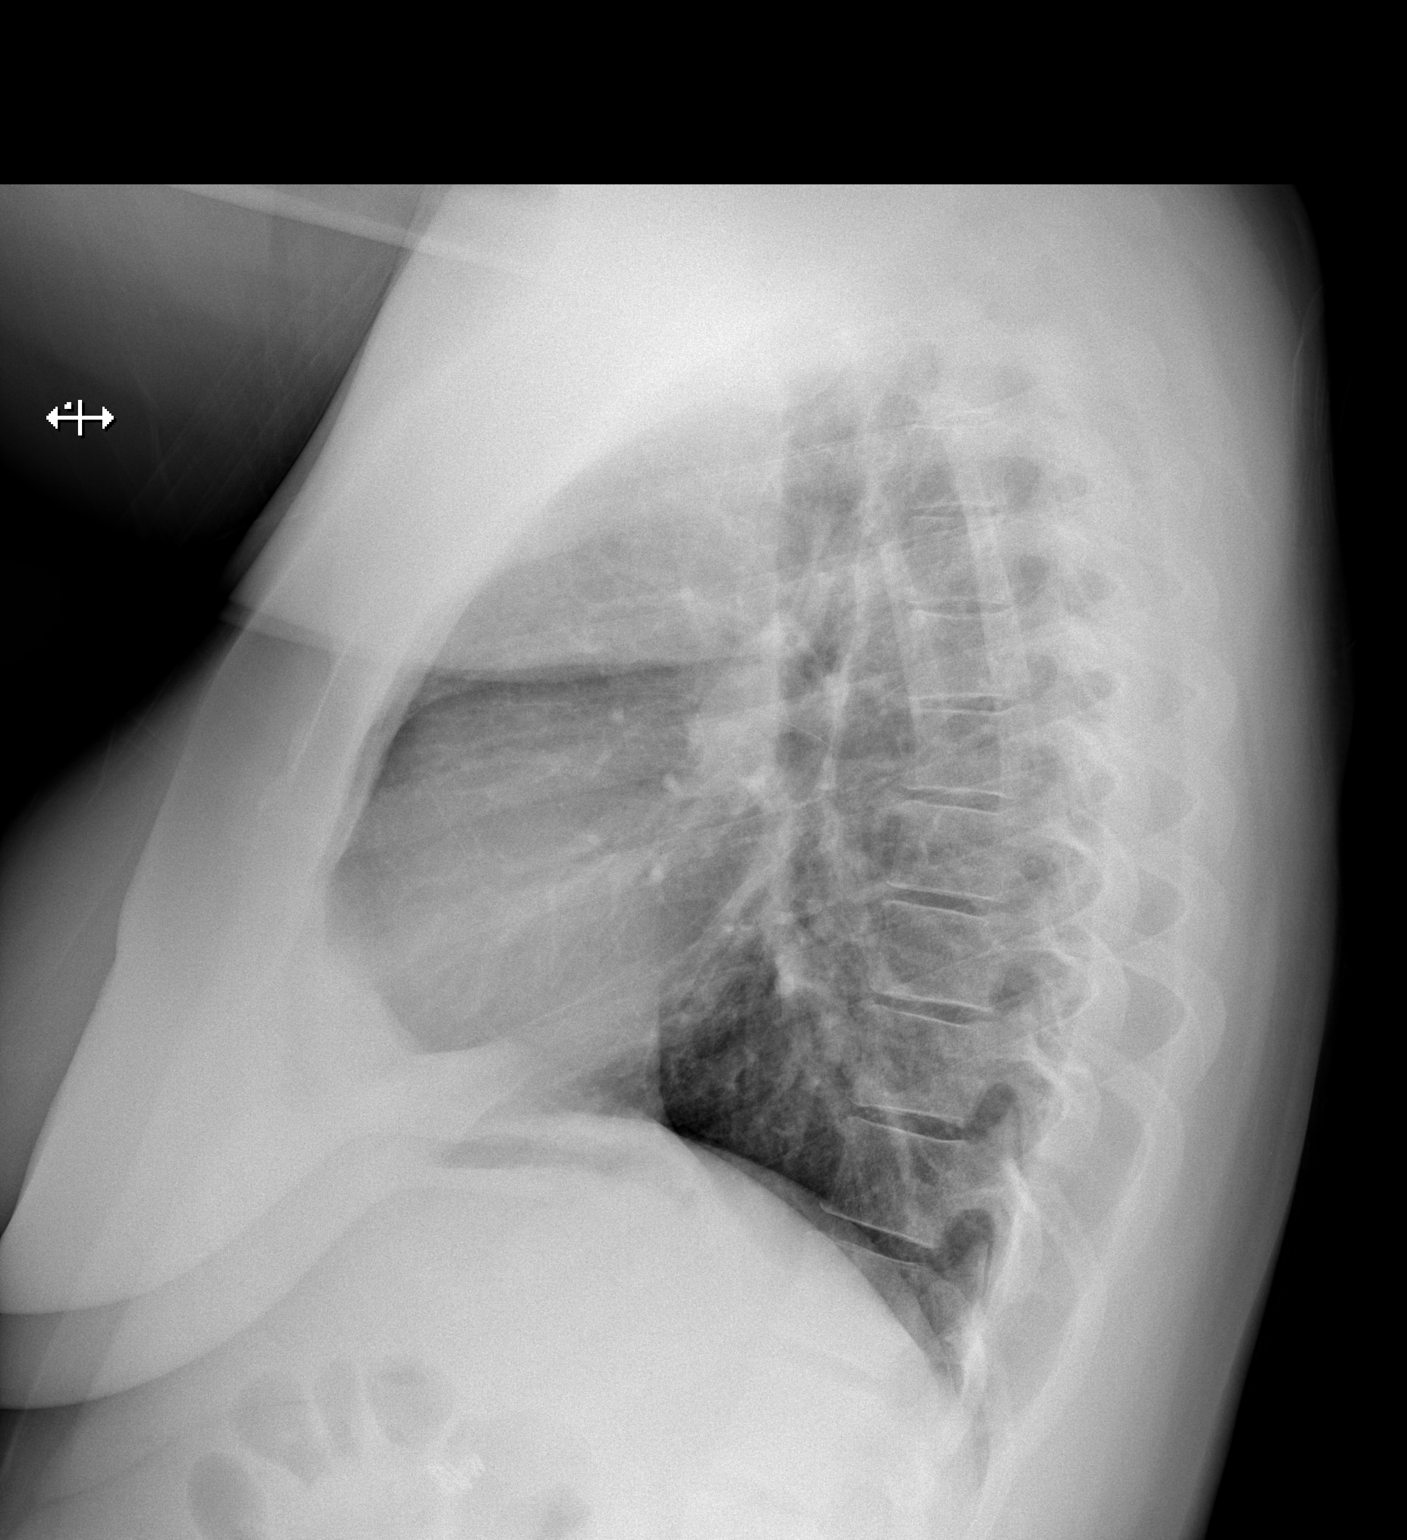

[2 of 2 positions shown; findings below may reference images not displayed]

IMPRESSION: 1. Chest radiograph without evidence of acute cardiopulmonary disease.
2. Comparison made to prior study dated [DATE]

## 2013-01-12 ENCOUNTER — Emergency Department: Payer: Self-pay | Admitting: Emergency Medicine

## 2013-01-12 LAB — CBC
HCT: 36.2 % (ref 35.0–47.0)
HGB: 12.6 g/dL (ref 12.0–16.0)
MCH: 30.5 pg (ref 26.0–34.0)
MCHC: 34.9 g/dL (ref 32.0–36.0)
MCV: 87 fL (ref 80–100)
Platelet: 268 10*3/uL (ref 150–440)
RBC: 4.14 10*6/uL (ref 3.80–5.20)
RDW: 13 % (ref 11.5–14.5)
WBC: 7.7 10*3/uL (ref 3.6–11.0)

## 2013-01-12 LAB — URINALYSIS, COMPLETE
Bacteria: NONE SEEN
Bilirubin,UR: NEGATIVE
Blood: NEGATIVE
Glucose,UR: NEGATIVE mg/dL (ref 0–75)
Ketone: NEGATIVE
Leukocyte Esterase: NEGATIVE
Nitrite: NEGATIVE
Ph: 7 (ref 4.5–8.0)
Protein: NEGATIVE
RBC,UR: NONE SEEN /HPF (ref 0–5)
Specific Gravity: 1.01 (ref 1.003–1.030)
Squamous Epithelial: 1
WBC UR: <1 /HPF (ref 0–5)

## 2013-01-12 LAB — BASIC METABOLIC PANEL
Anion Gap: 6 — ABNORMAL LOW (ref 7–16)
BUN: 8 mg/dL (ref 7–18)
Calcium, Total: 9.1 mg/dL (ref 8.5–10.1)
Chloride: 108 mmol/L — ABNORMAL HIGH (ref 98–107)
Co2: 25 mmol/L (ref 21–32)
Creatinine: 0.59 mg/dL — ABNORMAL LOW (ref 0.60–1.30)
EGFR (African American): >60
EGFR (Non-African Amer.): >60
Glucose: 90 mg/dL (ref 65–99)
Osmolality: 275 (ref 275–301)
Potassium: 4.1 mmol/L (ref 3.5–5.1)
Sodium: 139 mmol/L (ref 136–145)

## 2013-01-12 LAB — TROPONIN I: Troponin-I: <0.02 ng/mL

## 2013-01-12 LAB — PREGNANCY, URINE: Pregnancy Test, Urine: NEGATIVE m[IU]/mL

## 2013-01-12 IMAGING — US US EXTREM LOW VENOUS*R*
1 series · 14 of 24 positions shown · non-contrast
Comparison: none

REASON FOR EXAM: calf pain eval for DVT
COMMENTS:

PROCEDURE:     US  - US DOPPLER LOW EXTR RIGHT  - [DATE]  [DATE]
RESULT:     History: Calf pain.

[Series 1: us extrem low venous*right* · 0.09mm/px · 14 of 30 slices shown]
[im 1/30]
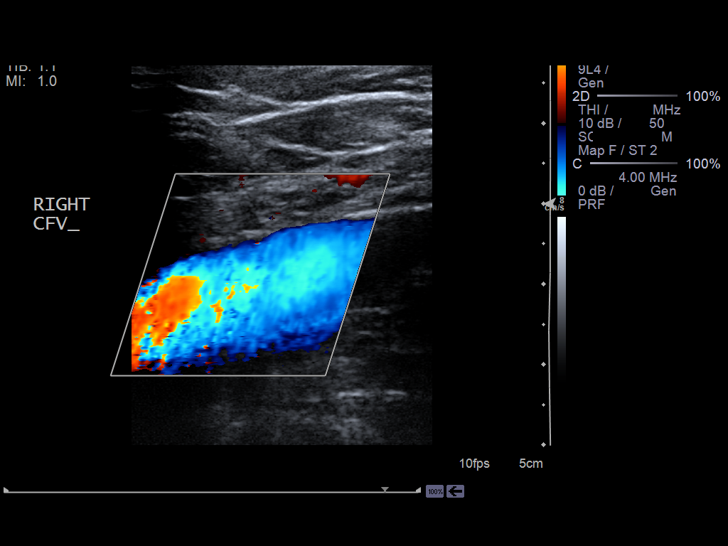
[im 3/30]
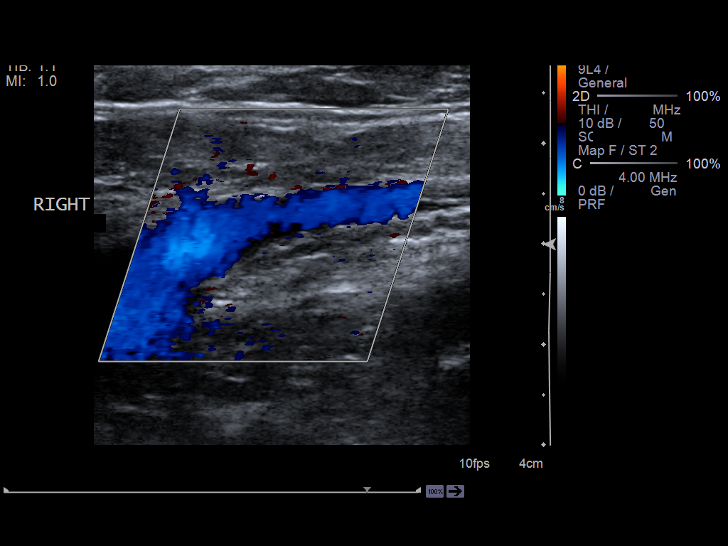
[im 6/30]
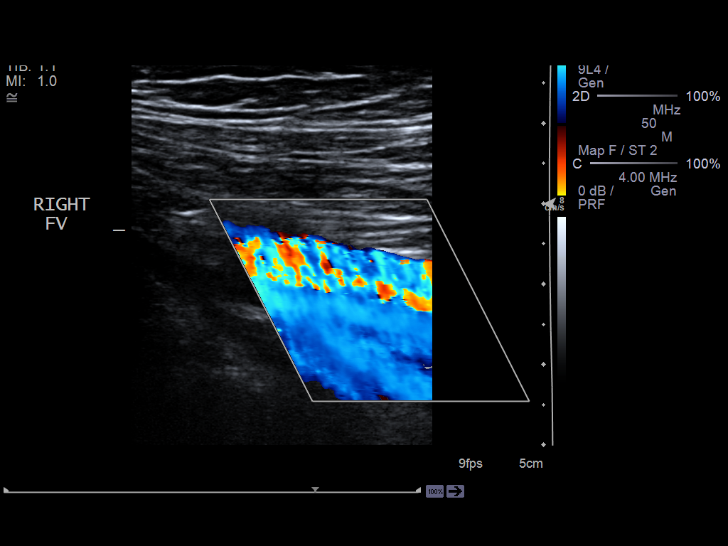
[im 8/30]
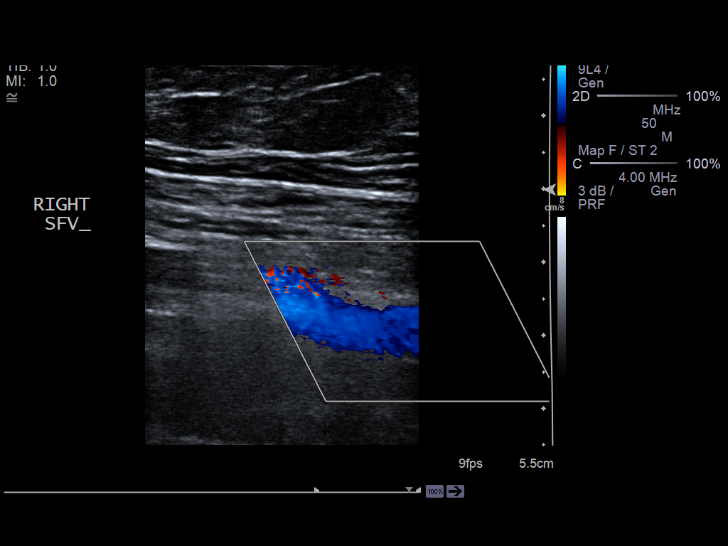
[im 9/30]
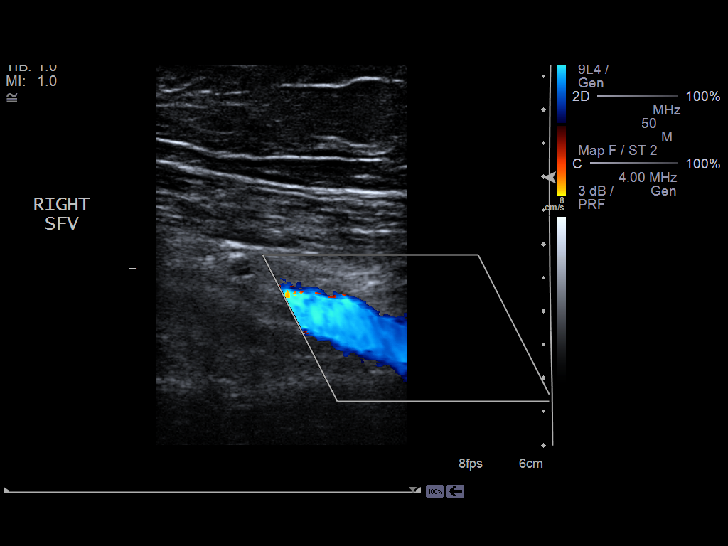
[im 12/30]
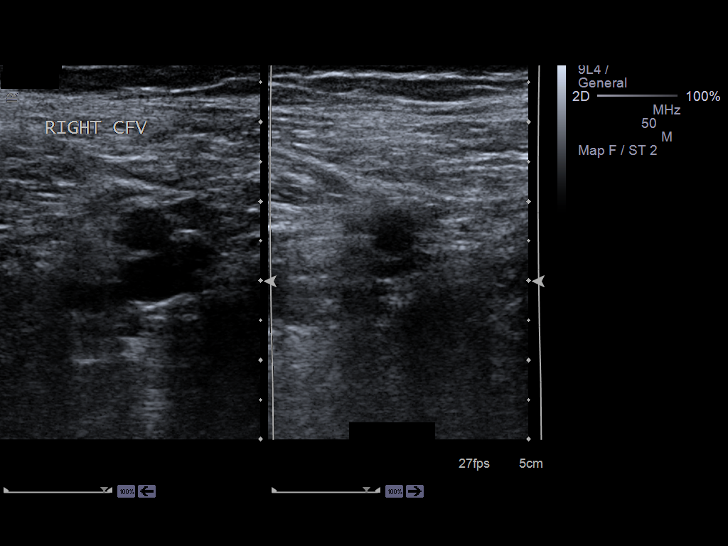
[im 14/30]
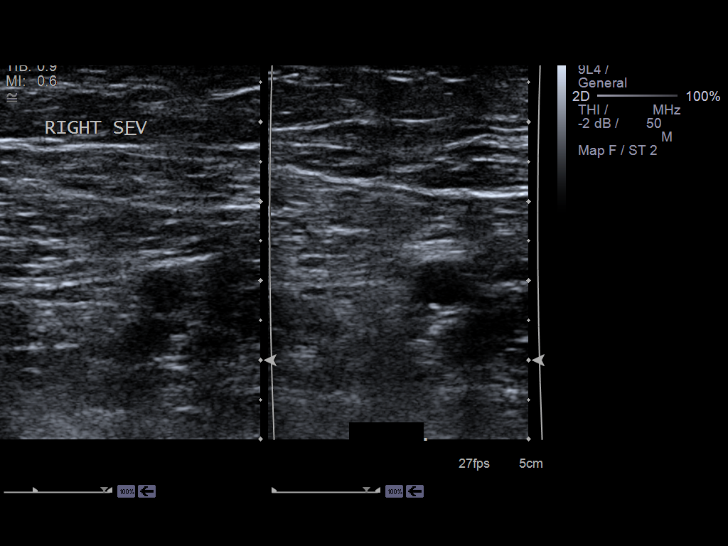
[im 16/30]
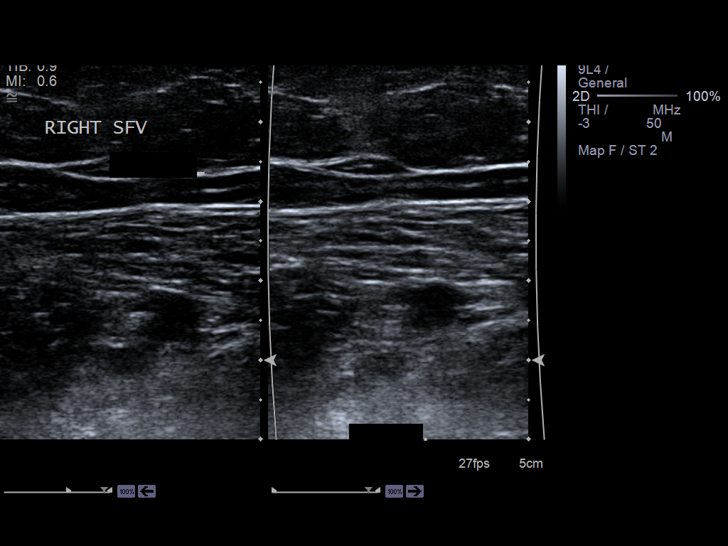
[im 18/30]
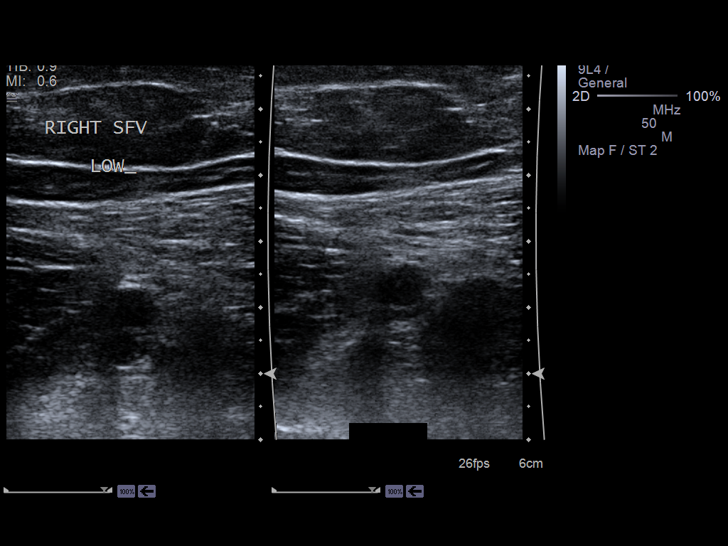
[im 21/30]
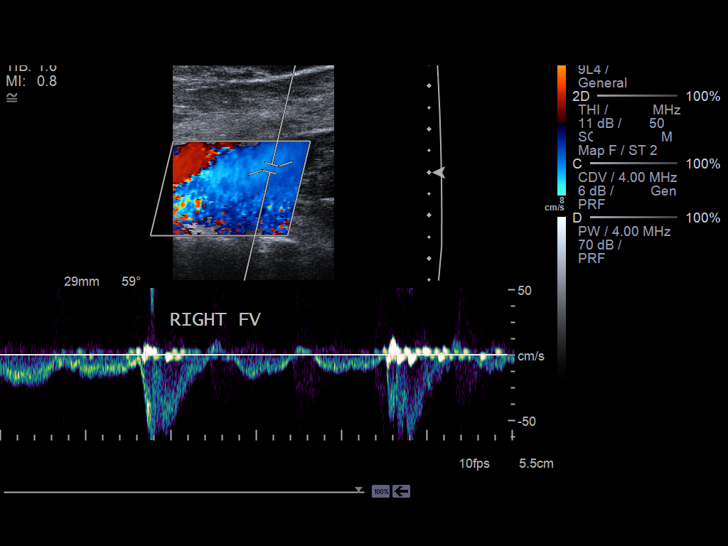
[im 23/30]
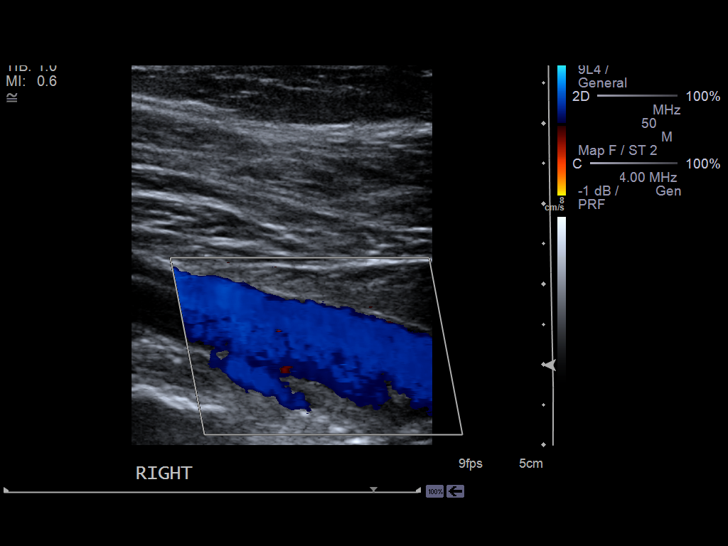
[im 24/30]
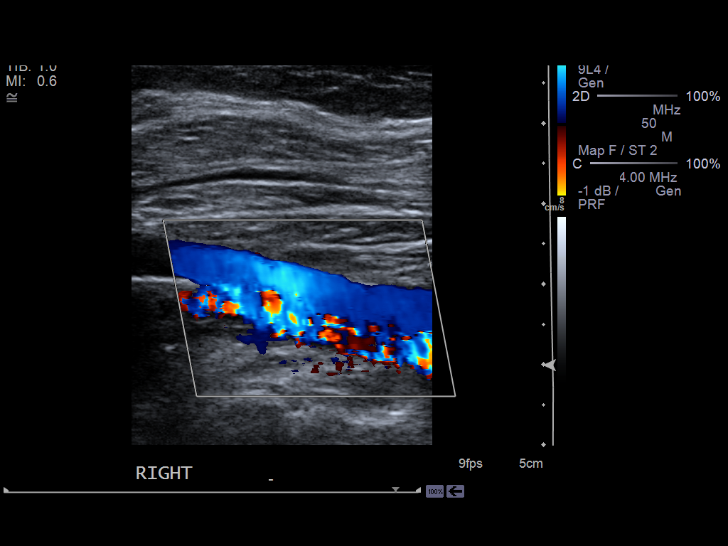
[im 27/30]
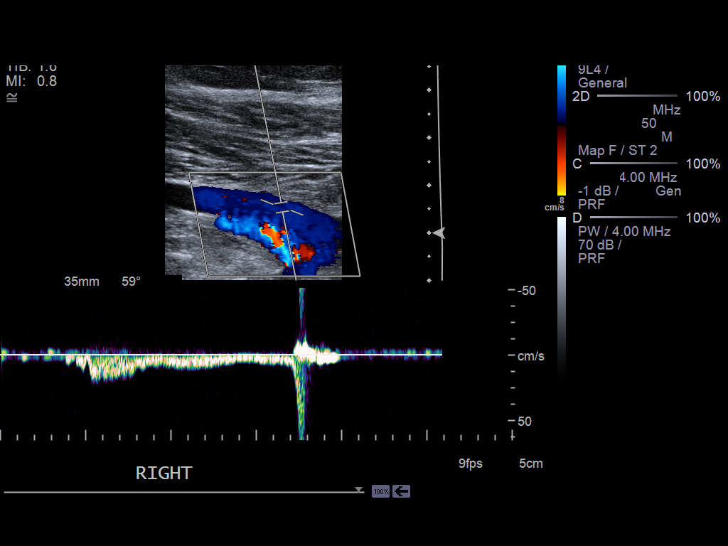
[im 30/30]
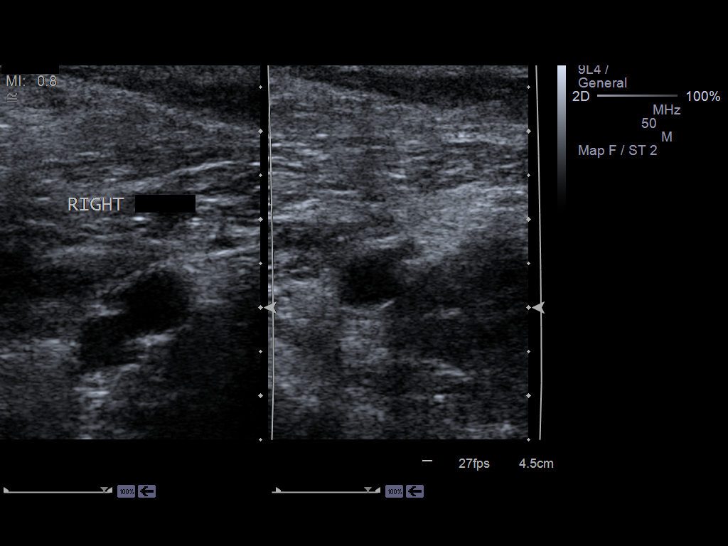

[14 of 24 positions shown; findings below may reference images not displayed]

FINDINGS: Right lower extremity color flow duplex Doppler reveals no
evidence of deep venous thrombosis.
IMPRESSION: No acute abnormality. No evidence of deep venous thrombosis.

## 2013-01-12 IMAGING — CT CT CHEST W/ CM
1 series · 15 of 34 positions shown, 19 images · IV contrast (APPLIED)
Comparison: None

REASON FOR EXAM: sharp chest pain
COMMENTS:

PROCEDURE:     CT  - CT CHEST (FOR PE) W  - [DATE] [DATE]
RESULT:     Indications: Chest Pain
TECHNIQUE: A thin-section spiral CT from the lung apices to the upper
abdomen was acquired on a multi slice scanner following 100ml [DZ]
intravenous contrast. These images were then transferred to the Siem[employer] work
station and were subsequently reviewed utilizing 3-D reconstructions and MIP
images.

[Series 4: soft tissue · axial · 0.71mm/px · z∈[-290,-48]mm · 15 of 95 slices shown, 19 images]
[im 7/95  mediastinal]
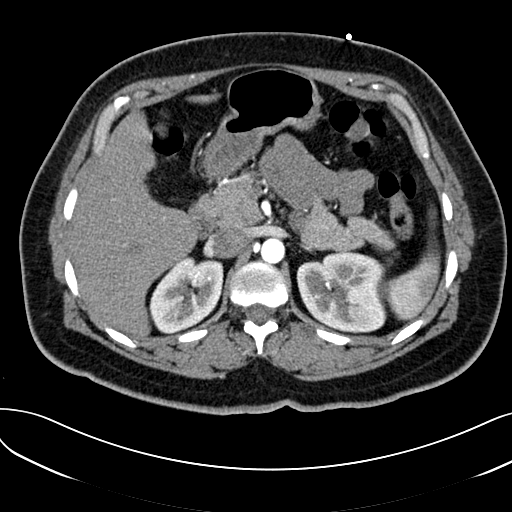
[im 7/95  lung]
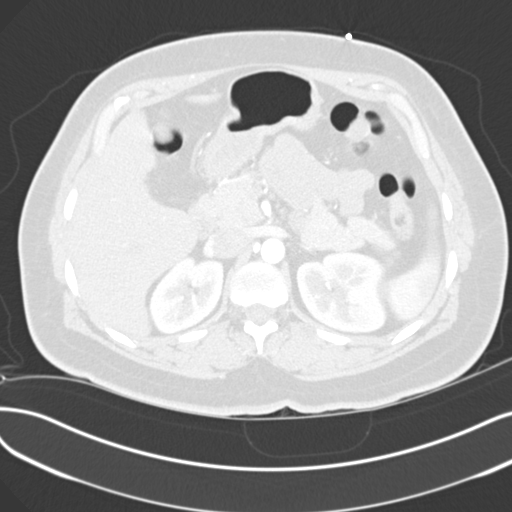
[im 14/95  lung]
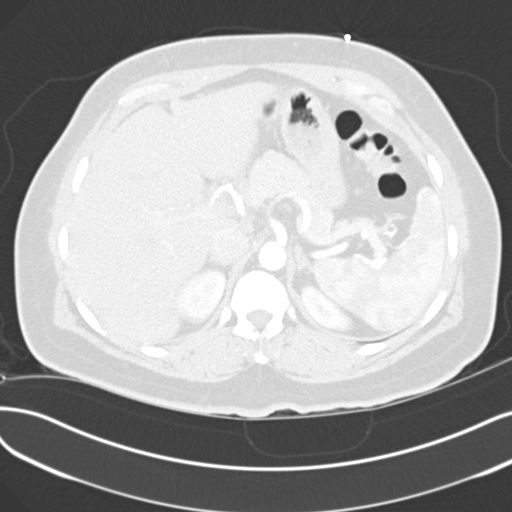
[im 19/95  lung]
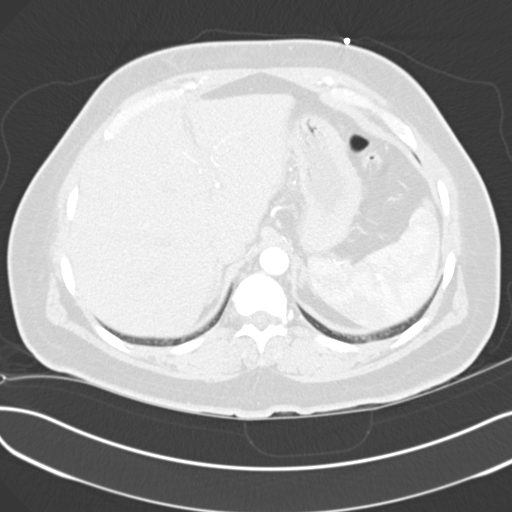
[im 25/95  lung]
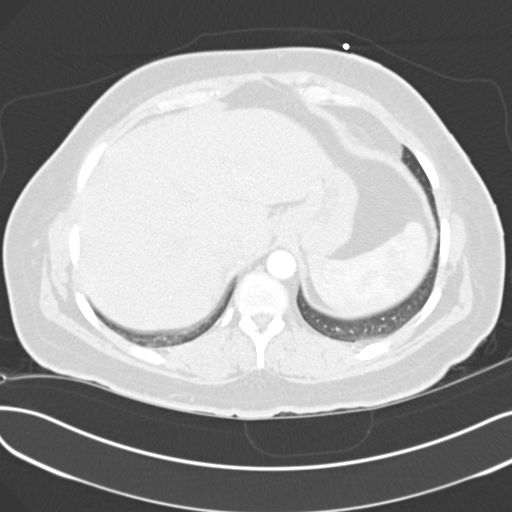
[im 32/95  mediastinal]
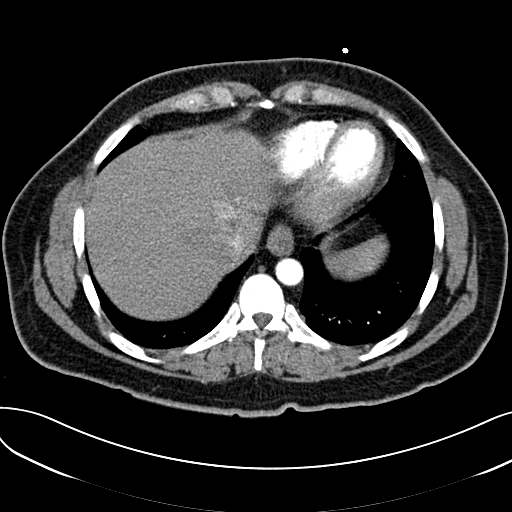
[im 32/95  lung]
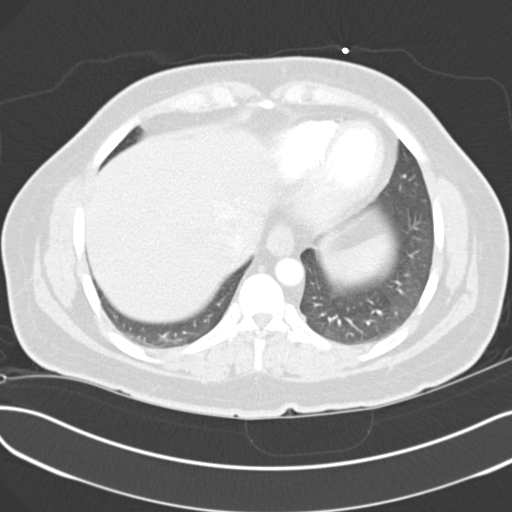
[im 38/95  lung]
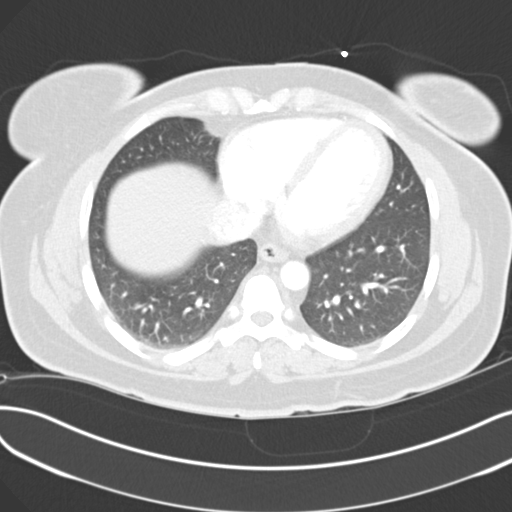
[im 42/95  lung]
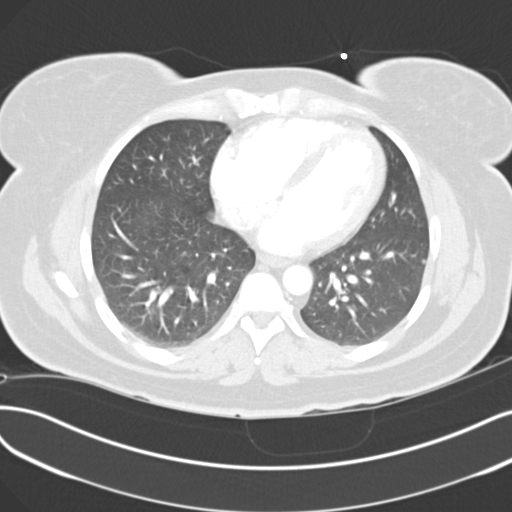
[im 49/95  lung]
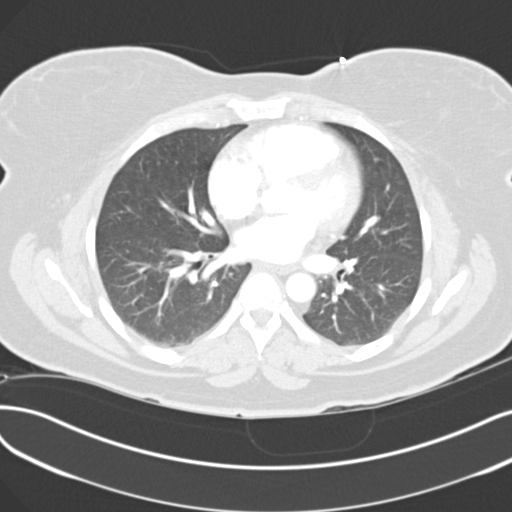
[im 53/95  mediastinal]
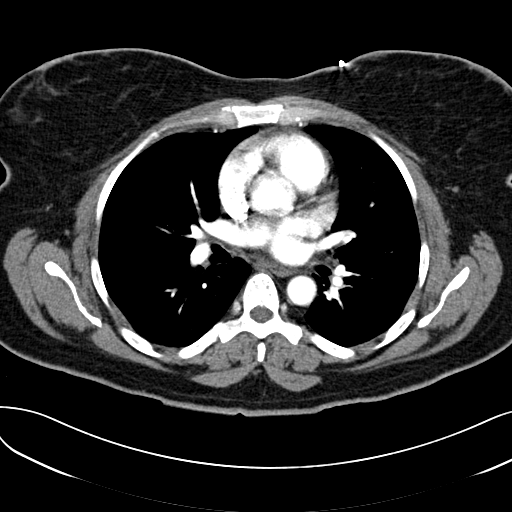
[im 53/95  lung]
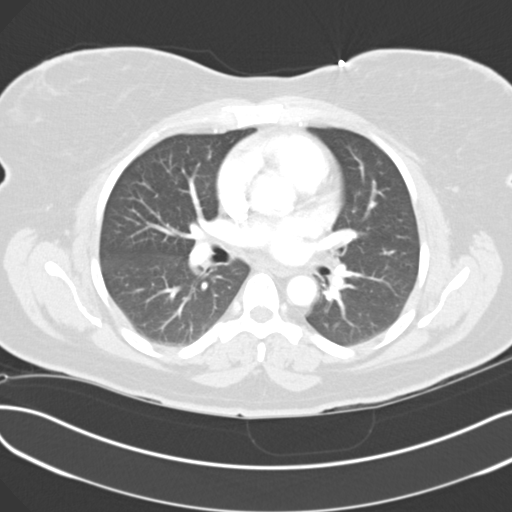
[im 57/95  lung]
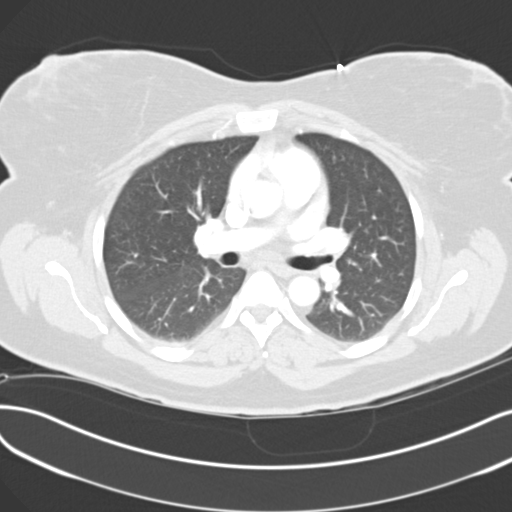
[im 63/95  lung]
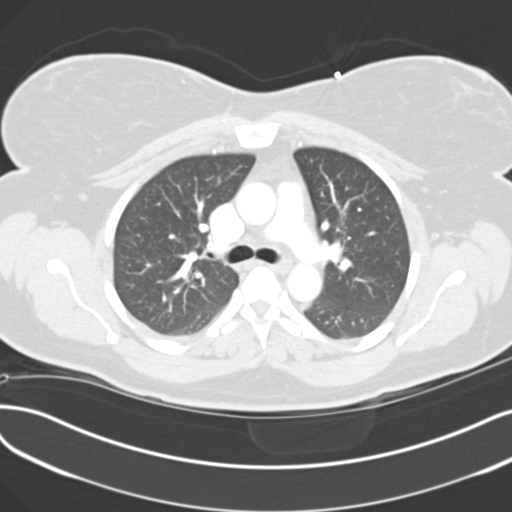
[im 70/95  lung]
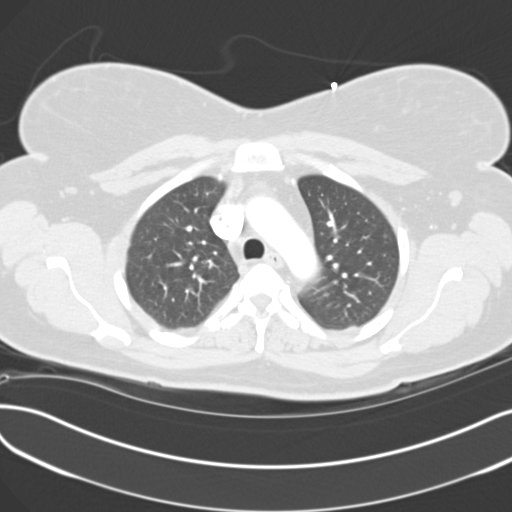
[im 76/95  mediastinal]
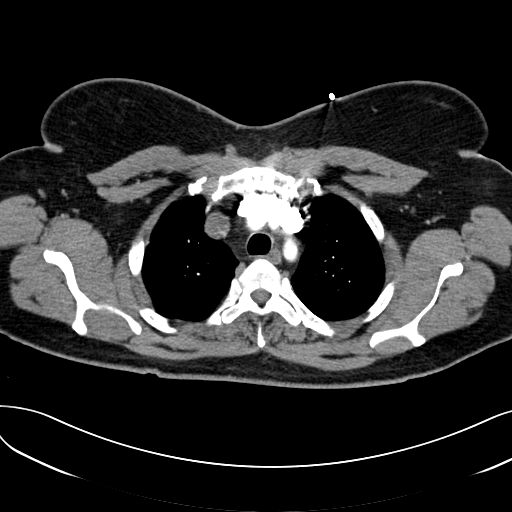
[im 76/95  lung]
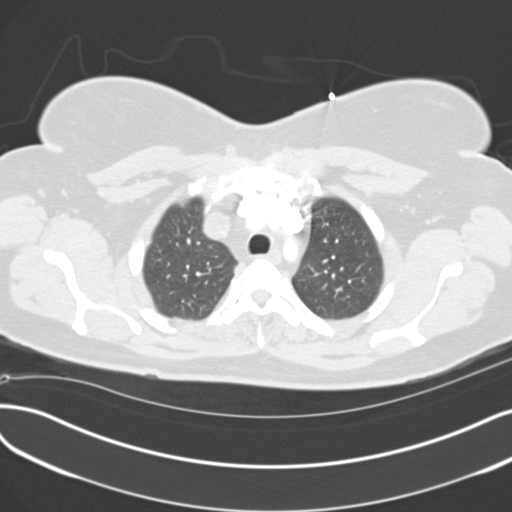
[im 81/95  lung]
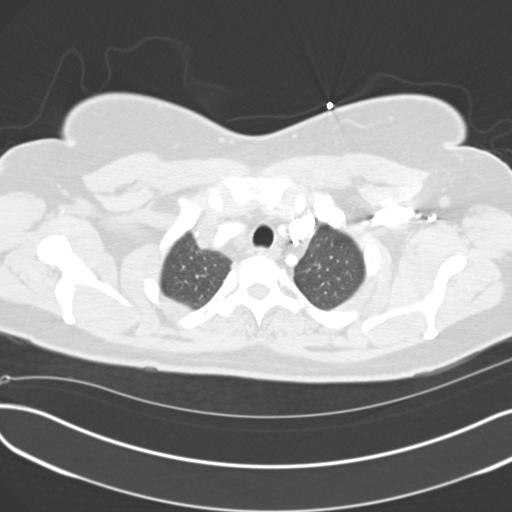
[im 88/95  lung]
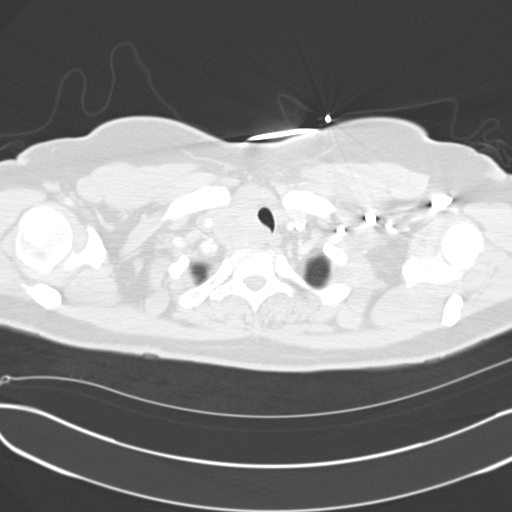

[15 of 34 positions shown; findings below may reference images not displayed]

FINDINGS: There is adequate opacification of the pulmonary arteries. There is no
pulmonary embolus. The main pulmonary artery, right main pulmonary artery,
and left main pulmonary arteries are normal in size. The heart size is
normal. There is no pericardial effusion.

The lungs are clear. There is no focal consolidation, pleural effusion, or
pneumothorax.

There is no axillary, hilar, or mediastinal adenopathy.

There is right thyromegaly with leftward displacement of the trachea.

The osseous structures are unremarkable.

The visualized portions of the upper abdomen are unremarkable.
IMPRESSION: 1. No CT evidence of pulmonary embolus.

2.There is right thyromegaly with leftward displacement of the trachea
likely representing a thyroid goiter. If there is further clinical concern
recommend a thyroid ultrasound.

[REDACTED]

## 2013-01-12 IMAGING — CR DG CHEST 2V
1 series · 2 of 2 positions shown · non-contrast
Comparison: none

REASON FOR EXAM: Chest Pain
COMMENTS:

[Series 1: w chest pa · 0.14mm/px · 2 of 2 slices shown]
[im 1/2]
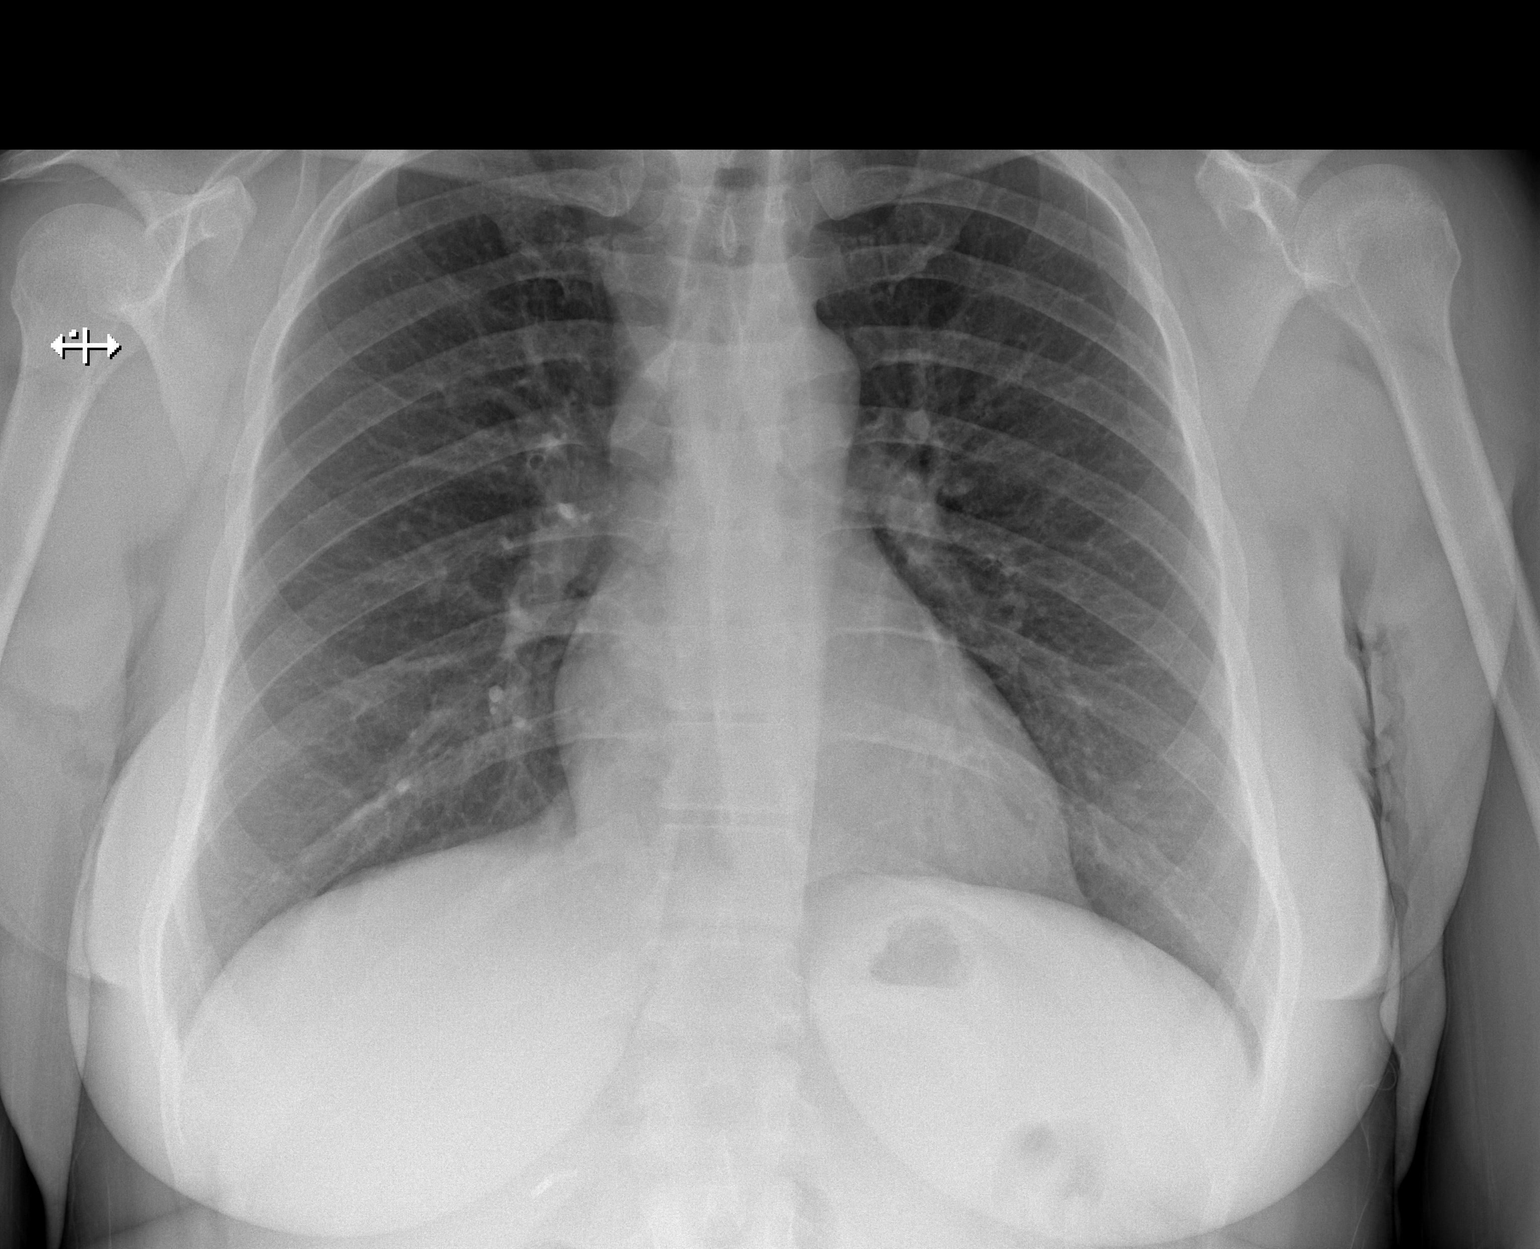
[im 2/2]
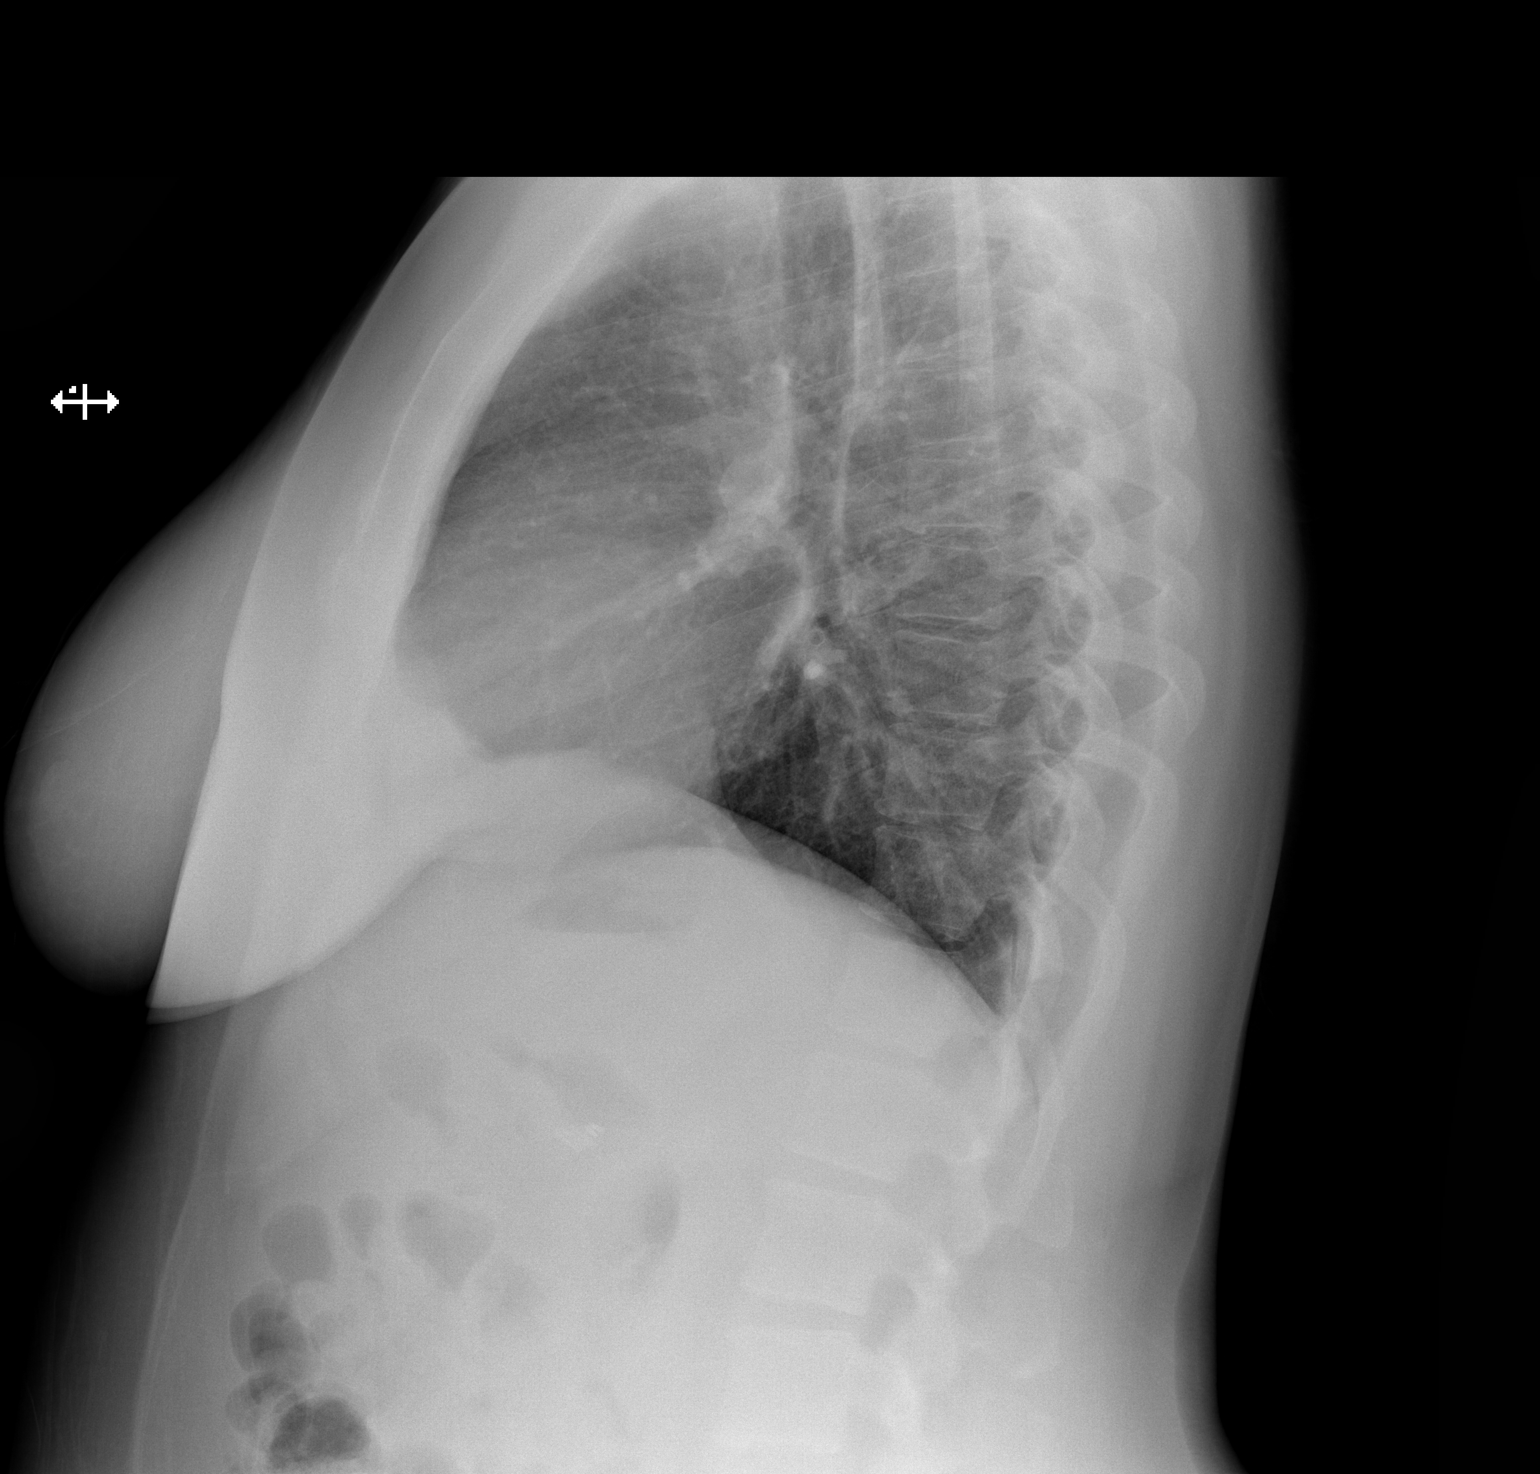

[2 of 2 positions shown; findings below may reference images not displayed]

PROCEDURE:     DXR - DXR CHEST PA (OR AP) AND LATERAL  - [DATE]  [DATE]

RESULT:     Comparison is made to a study [DATE].

The lungs are mildly hyperinflated. There is no focal infiltrate. The
cardiac silhouette is normal in size. The pulmonary vascularity is not
engorged. There is no pleural effusion or pneumothorax. The mediastinum is
normal in width. The bony structures exhibit no acute abnormality.
IMPRESSION: There is mild hyperinflation which may reflect reactive
airway disease or COPD. There is no evidence of pneumonia nor CHF.

[REDACTED]

## 2013-01-13 ENCOUNTER — Emergency Department: Payer: Self-pay | Admitting: Emergency Medicine

## 2013-01-16 ENCOUNTER — Emergency Department: Payer: Self-pay | Admitting: Emergency Medicine

## 2013-01-16 LAB — TROPONIN I
Troponin-I: <0.02 ng/mL
Troponin-I: <0.02 ng/mL

## 2013-01-16 LAB — BASIC METABOLIC PANEL
Anion Gap: 5 — ABNORMAL LOW (ref 7–16)
BUN: 8 mg/dL (ref 7–18)
Calcium, Total: 9.1 mg/dL (ref 8.5–10.1)
Chloride: 109 mmol/L — ABNORMAL HIGH (ref 98–107)
Co2: 25 mmol/L (ref 21–32)
Creatinine: 0.59 mg/dL — ABNORMAL LOW (ref 0.60–1.30)
EGFR (African American): >60
EGFR (Non-African Amer.): >60
Glucose: 98 mg/dL (ref 65–99)
Osmolality: 276 (ref 275–301)
Potassium: 3.7 mmol/L (ref 3.5–5.1)
Sodium: 139 mmol/L (ref 136–145)

## 2013-01-16 LAB — CBC
HCT: 36.2 % (ref 35.0–47.0)
HGB: 12.9 g/dL (ref 12.0–16.0)
MCH: 31 pg (ref 26.0–34.0)
MCHC: 35.5 g/dL (ref 32.0–36.0)
MCV: 88 fL (ref 80–100)
Platelet: 246 10*3/uL (ref 150–440)
RBC: 4.14 10*6/uL (ref 3.80–5.20)
RDW: 13.1 % (ref 11.5–14.5)
WBC: 6.1 10*3/uL (ref 3.6–11.0)

## 2013-01-16 IMAGING — CR DG CHEST 2V
1 series · 2 of 2 positions shown · non-contrast
Comparison: none

REASON FOR EXAM: Chest Pain
COMMENTS:

PROCEDURE:     DXR - DXR CHEST PA (OR AP) AND LATERAL  - [DATE] [DATE]
RESULT:     Comparison: None

[Series 1: pa · 0.17mm/px · 2 of 2 slices shown]
[im 1/2]
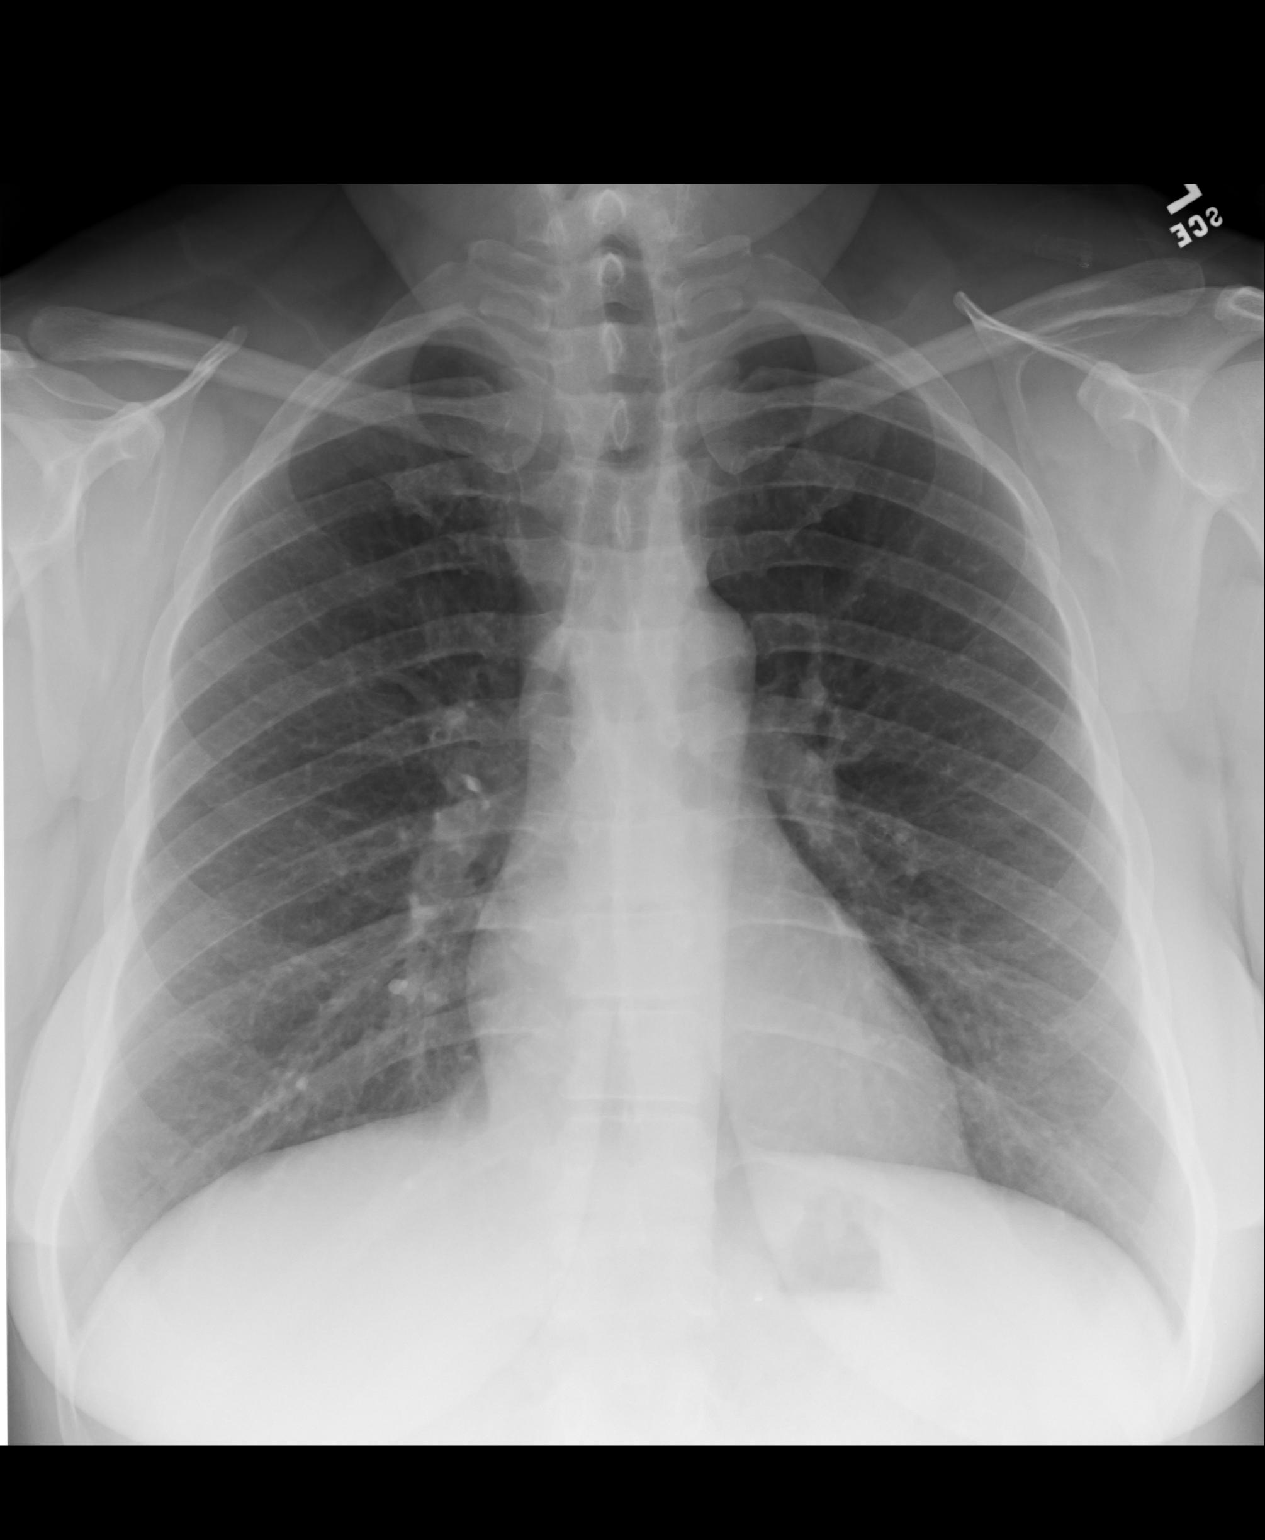
[im 2/2]
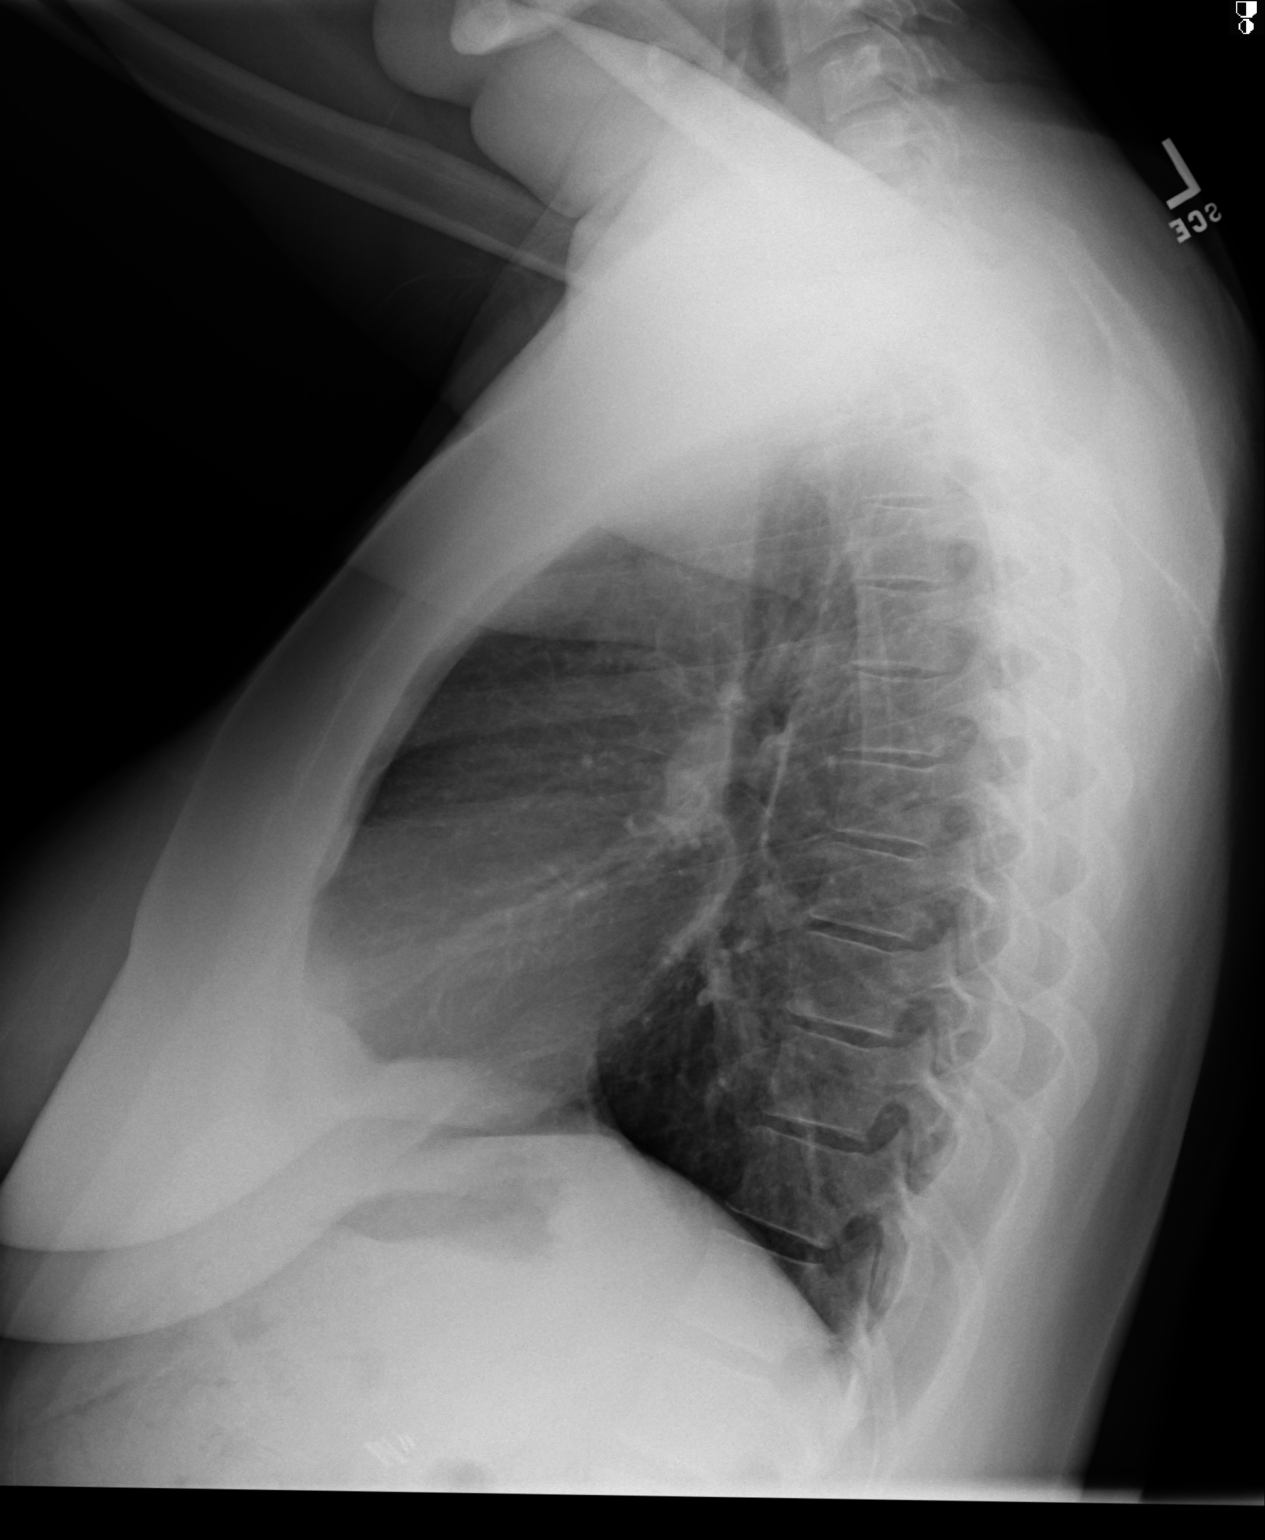

[2 of 2 positions shown; findings below may reference images not displayed]

FINDINGS: PA and lateral chest radiographs are provided.  There is no focal
parenchymal opacity, pleural effusion, or pneumothorax. The heart and
mediastinum are unremarkable.  The osseous structures are unremarkable.
IMPRESSION: No acute disease of the che[REDACTED]

## 2013-04-24 ENCOUNTER — Emergency Department: Payer: Self-pay | Admitting: Emergency Medicine

## 2013-04-24 LAB — CBC
HCT: 33.9 % — ABNORMAL LOW (ref 35.0–47.0)
HGB: 12.1 g/dL (ref 12.0–16.0)
MCH: 30.8 pg (ref 26.0–34.0)
MCHC: 35.6 g/dL (ref 32.0–36.0)
MCV: 87 fL (ref 80–100)
Platelet: 262 10*3/uL (ref 150–440)
RBC: 3.91 10*6/uL (ref 3.80–5.20)
RDW: 13.4 % (ref 11.5–14.5)
WBC: 9.1 10*3/uL (ref 3.6–11.0)

## 2013-04-24 LAB — BASIC METABOLIC PANEL
Anion Gap: 7 (ref 7–16)
BUN: 8 mg/dL (ref 7–18)
Calcium, Total: 9 mg/dL (ref 8.5–10.1)
Chloride: 104 mmol/L (ref 98–107)
Co2: 27 mmol/L (ref 21–32)
Creatinine: 0.68 mg/dL (ref 0.60–1.30)
EGFR (African American): >60
EGFR (Non-African Amer.): >60
Glucose: 98 mg/dL (ref 65–99)
Osmolality: 274 (ref 275–301)
Potassium: 3.8 mmol/L (ref 3.5–5.1)
Sodium: 138 mmol/L (ref 136–145)

## 2013-04-24 IMAGING — CR DG CHEST 2V
1 series · 2 of 2 positions shown · non-contrast
Comparison: none

REASON FOR EXAM: Chest Pain
COMMENTS:

[Series 1: w chest pa · 0.14mm/px · 2 of 2 slices shown]
[im 1/2]
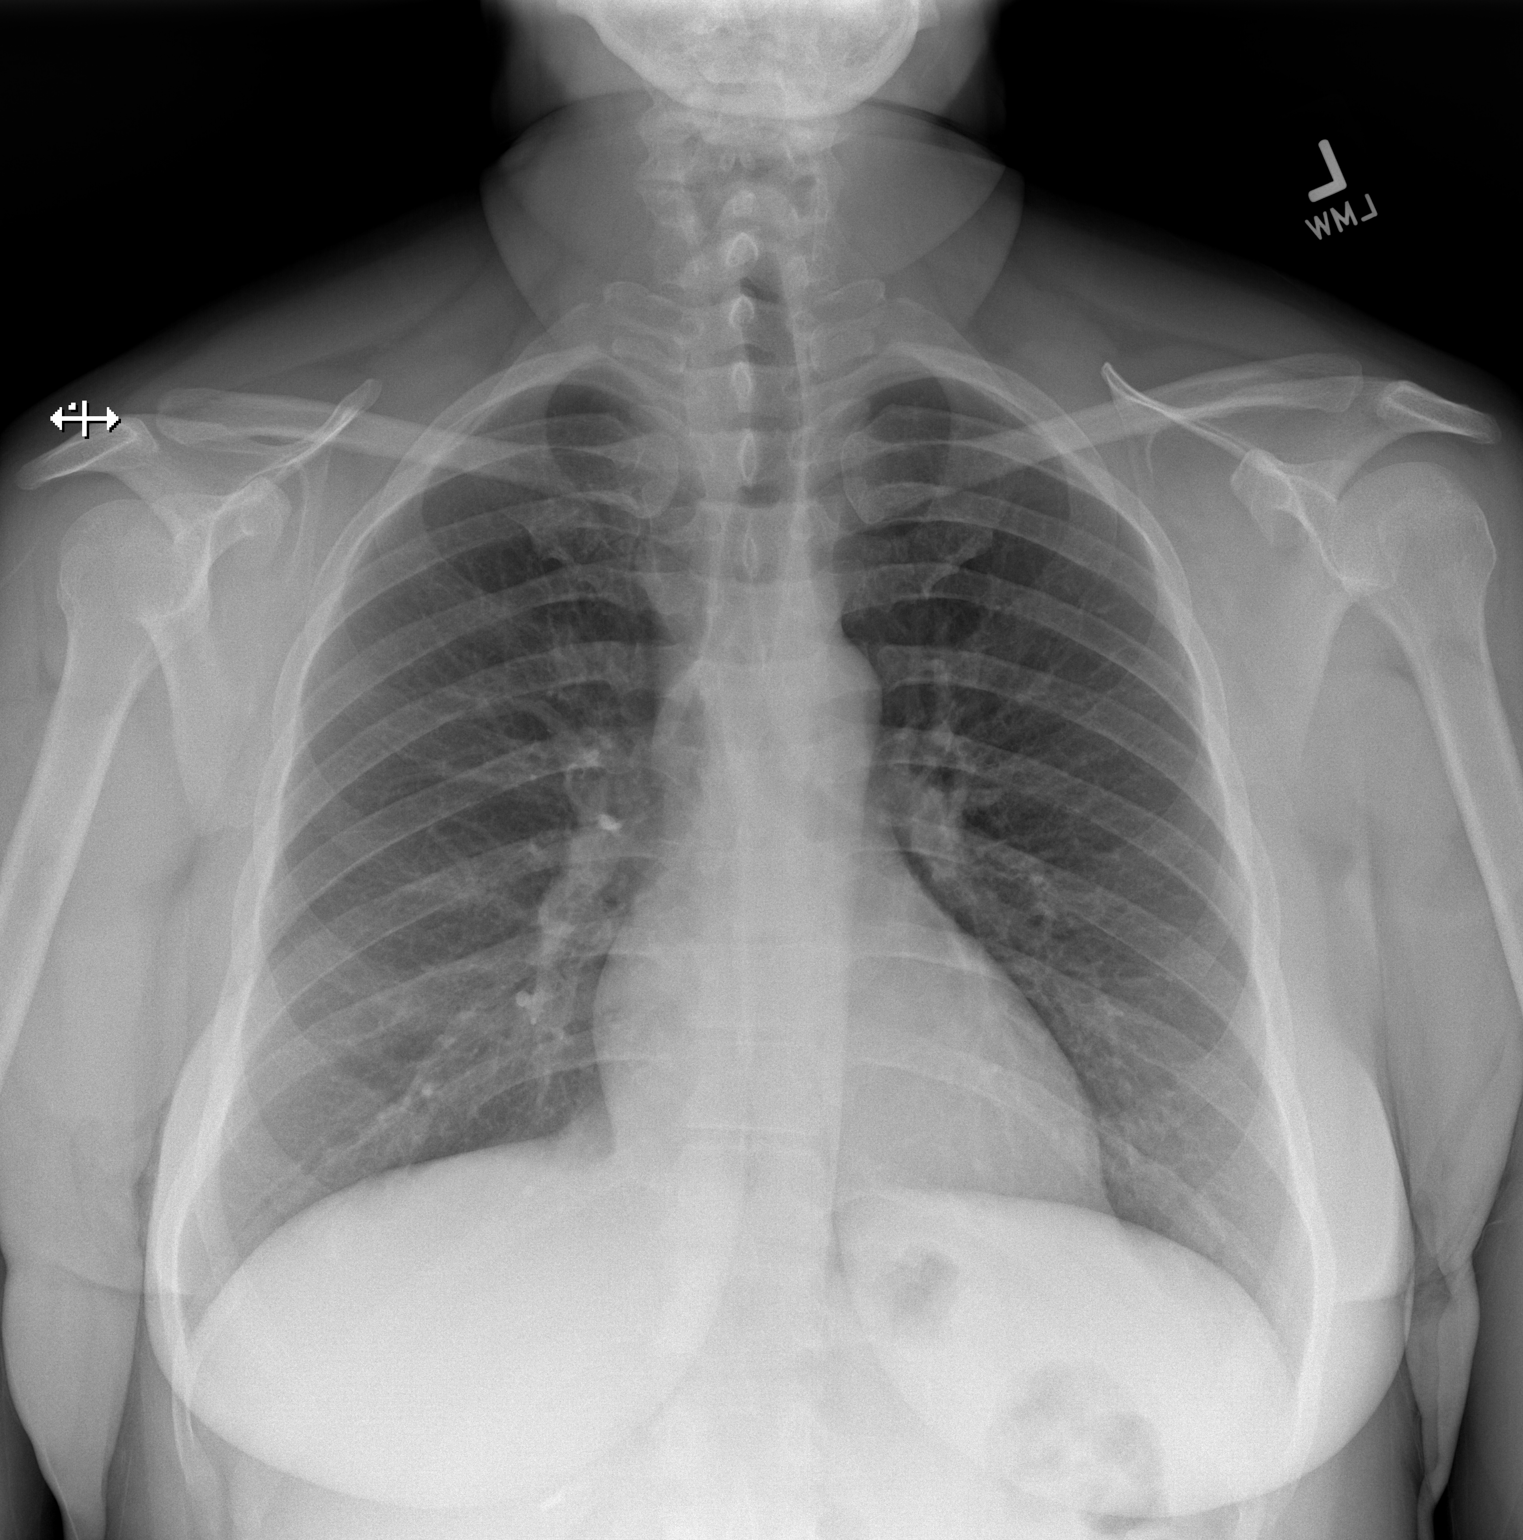
[im 2/2]
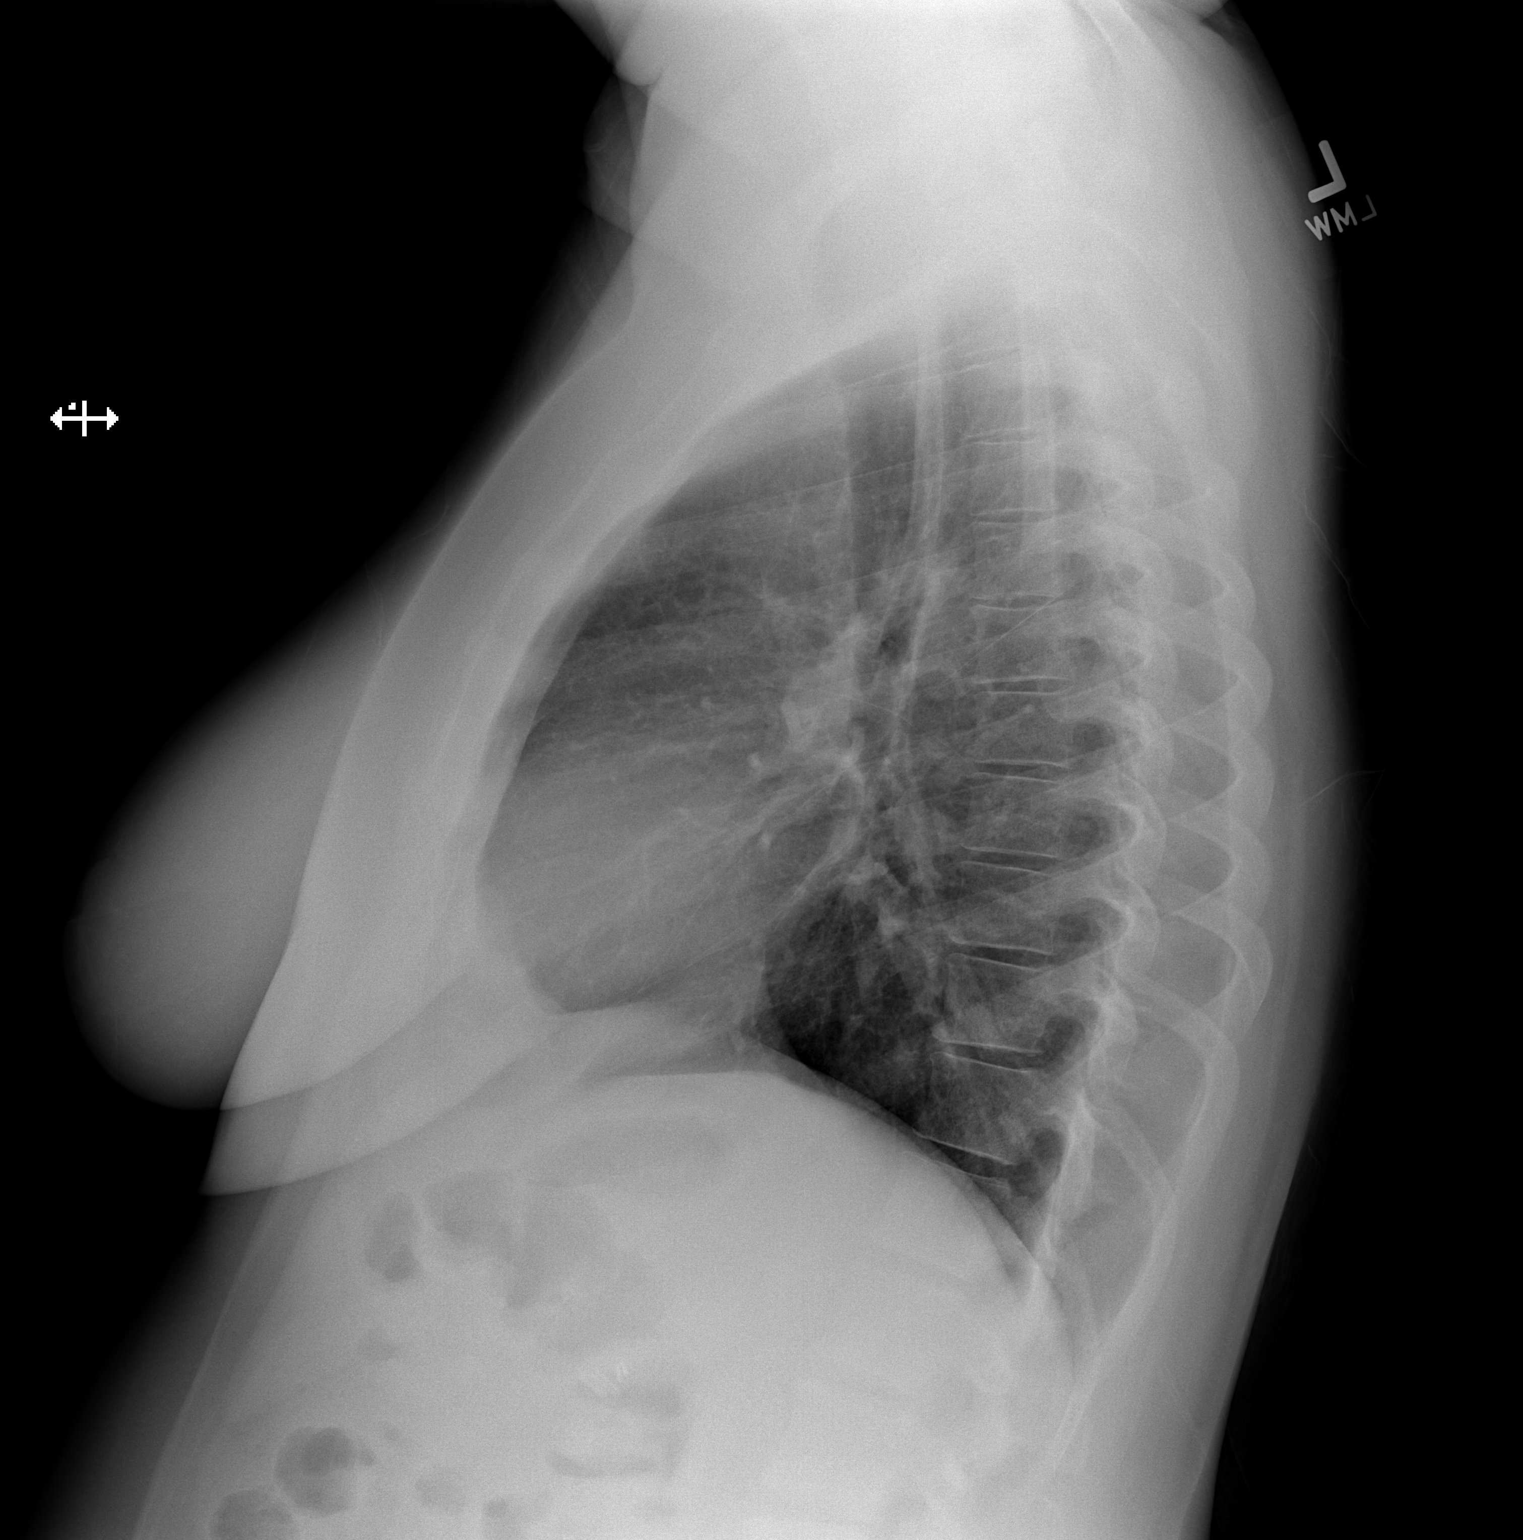

[2 of 2 positions shown; findings below may reference images not displayed]

PROCEDURE:     DXR - DXR CHEST PA (OR AP) AND LATERAL  - [DATE] [DATE]

RESULT:     Comparison is made to a study [DATE].

The lungs are adequately inflated. There is no focal infiltrate. The cardiac
silhouette is normal in size. The pulmonary vascularity is not engorged. The
mediastinum is normal in width. There is no pleural effusion or
pneumothorax. The observed portions of the bony thorax appear normal.
IMPRESSION: There is no evidence of acute cardiopulmonary abnormality.
I cannot exclude acute bronchitis in the appropriate clinical setting.

[REDACTED]

## 2013-04-25 LAB — TROPONIN I
Troponin-I: <0.02 ng/mL
Troponin-I: <0.02 ng/mL

## 2013-05-07 LAB — BASIC METABOLIC PANEL
Anion Gap: 9 (ref 7–16)
BUN: 12 mg/dL (ref 7–18)
Calcium, Total: 9.4 mg/dL (ref 8.5–10.1)
Chloride: 106 mmol/L (ref 98–107)
Co2: 23 mmol/L (ref 21–32)
Creatinine: 0.65 mg/dL (ref 0.60–1.30)
EGFR (African American): >60
EGFR (Non-African Amer.): >60
Glucose: 122 mg/dL — ABNORMAL HIGH (ref 65–99)
Osmolality: 277 (ref 275–301)
Potassium: 3.7 mmol/L (ref 3.5–5.1)
Sodium: 138 mmol/L (ref 136–145)

## 2013-05-07 LAB — CBC
HCT: 37.3 % (ref 35.0–47.0)
HGB: 13 g/dL (ref 12.0–16.0)
MCH: 30.5 pg (ref 26.0–34.0)
MCHC: 34.8 g/dL (ref 32.0–36.0)
MCV: 88 fL (ref 80–100)
Platelet: 263 10*3/uL (ref 150–440)
RBC: 4.25 10*6/uL (ref 3.80–5.20)
RDW: 13.4 % (ref 11.5–14.5)
WBC: 9.2 10*3/uL (ref 3.6–11.0)

## 2013-05-07 LAB — TROPONIN I: Troponin-I: <0.02 ng/mL

## 2013-05-07 IMAGING — CR DG CHEST 2V
1 series · 2 of 2 positions shown · non-contrast
Comparison: none

REASON FOR EXAM: chest  pain
COMMENTS:

[Series 1: pa · 0.17mm/px · 2 of 2 slices shown]
[im 1/2]
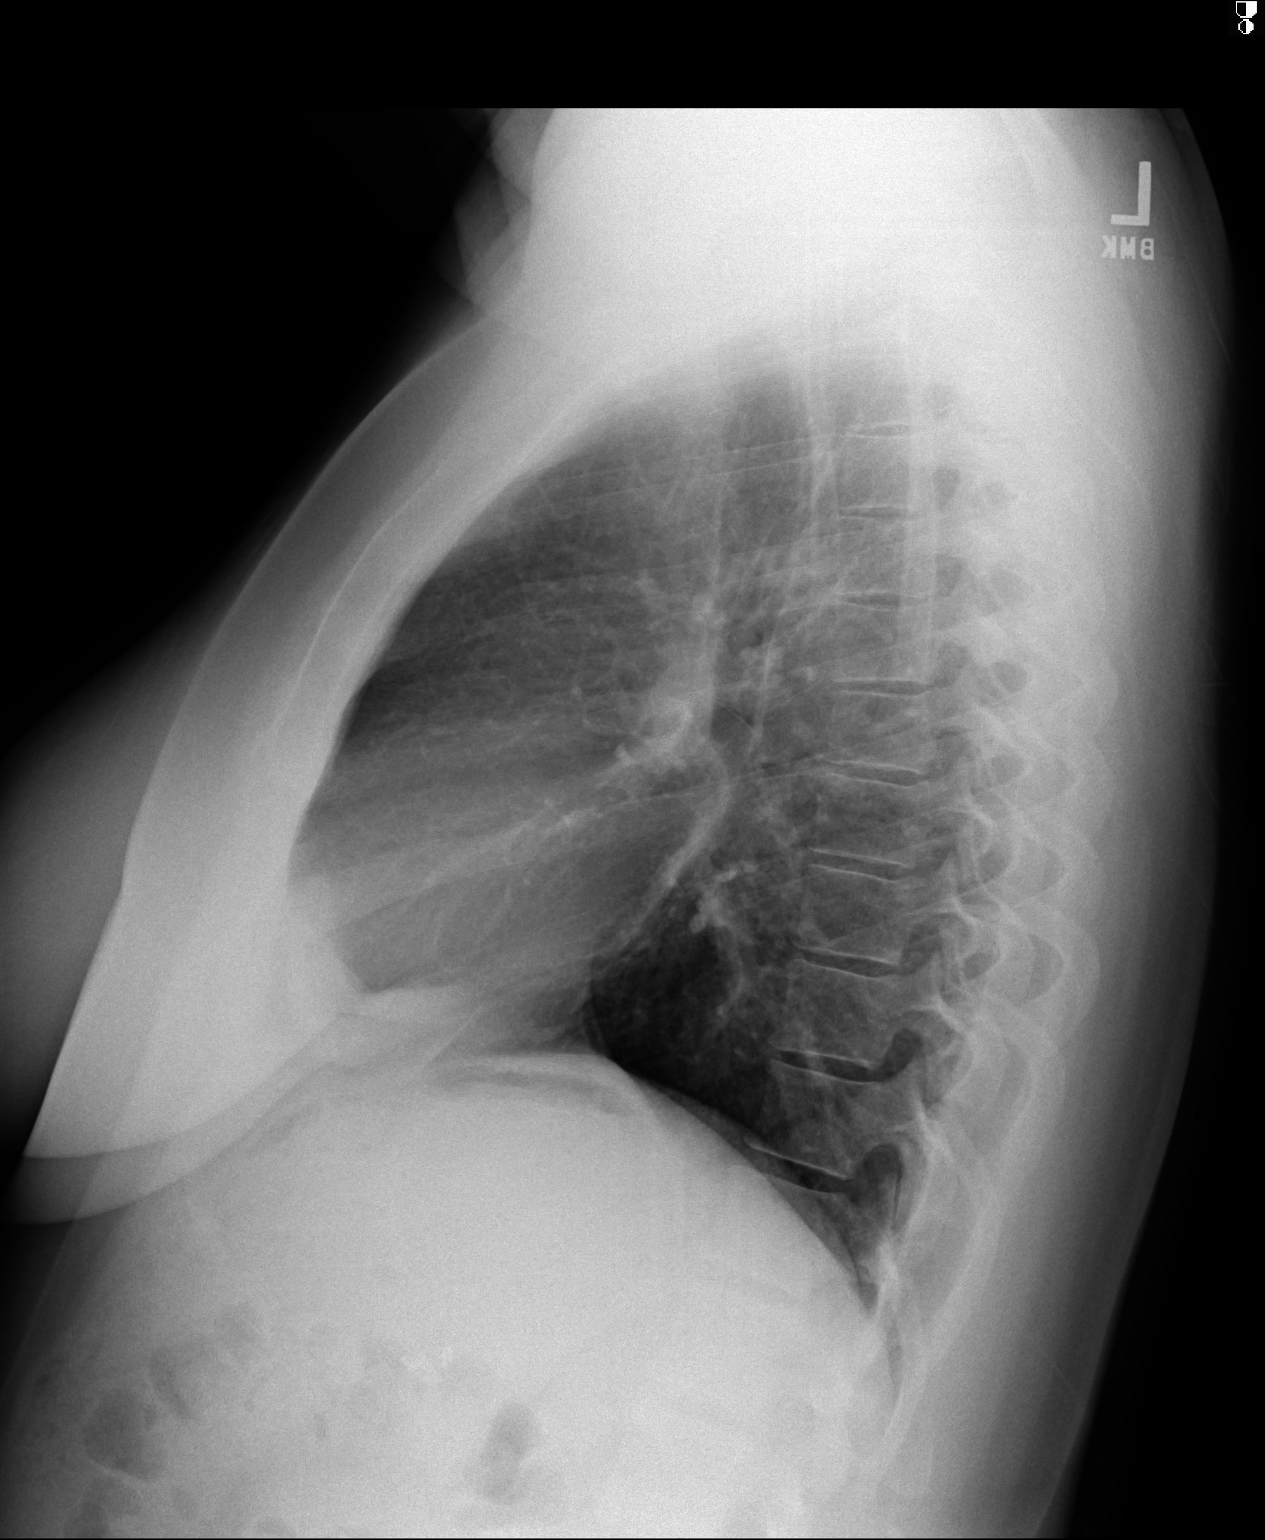
[im 2/2]
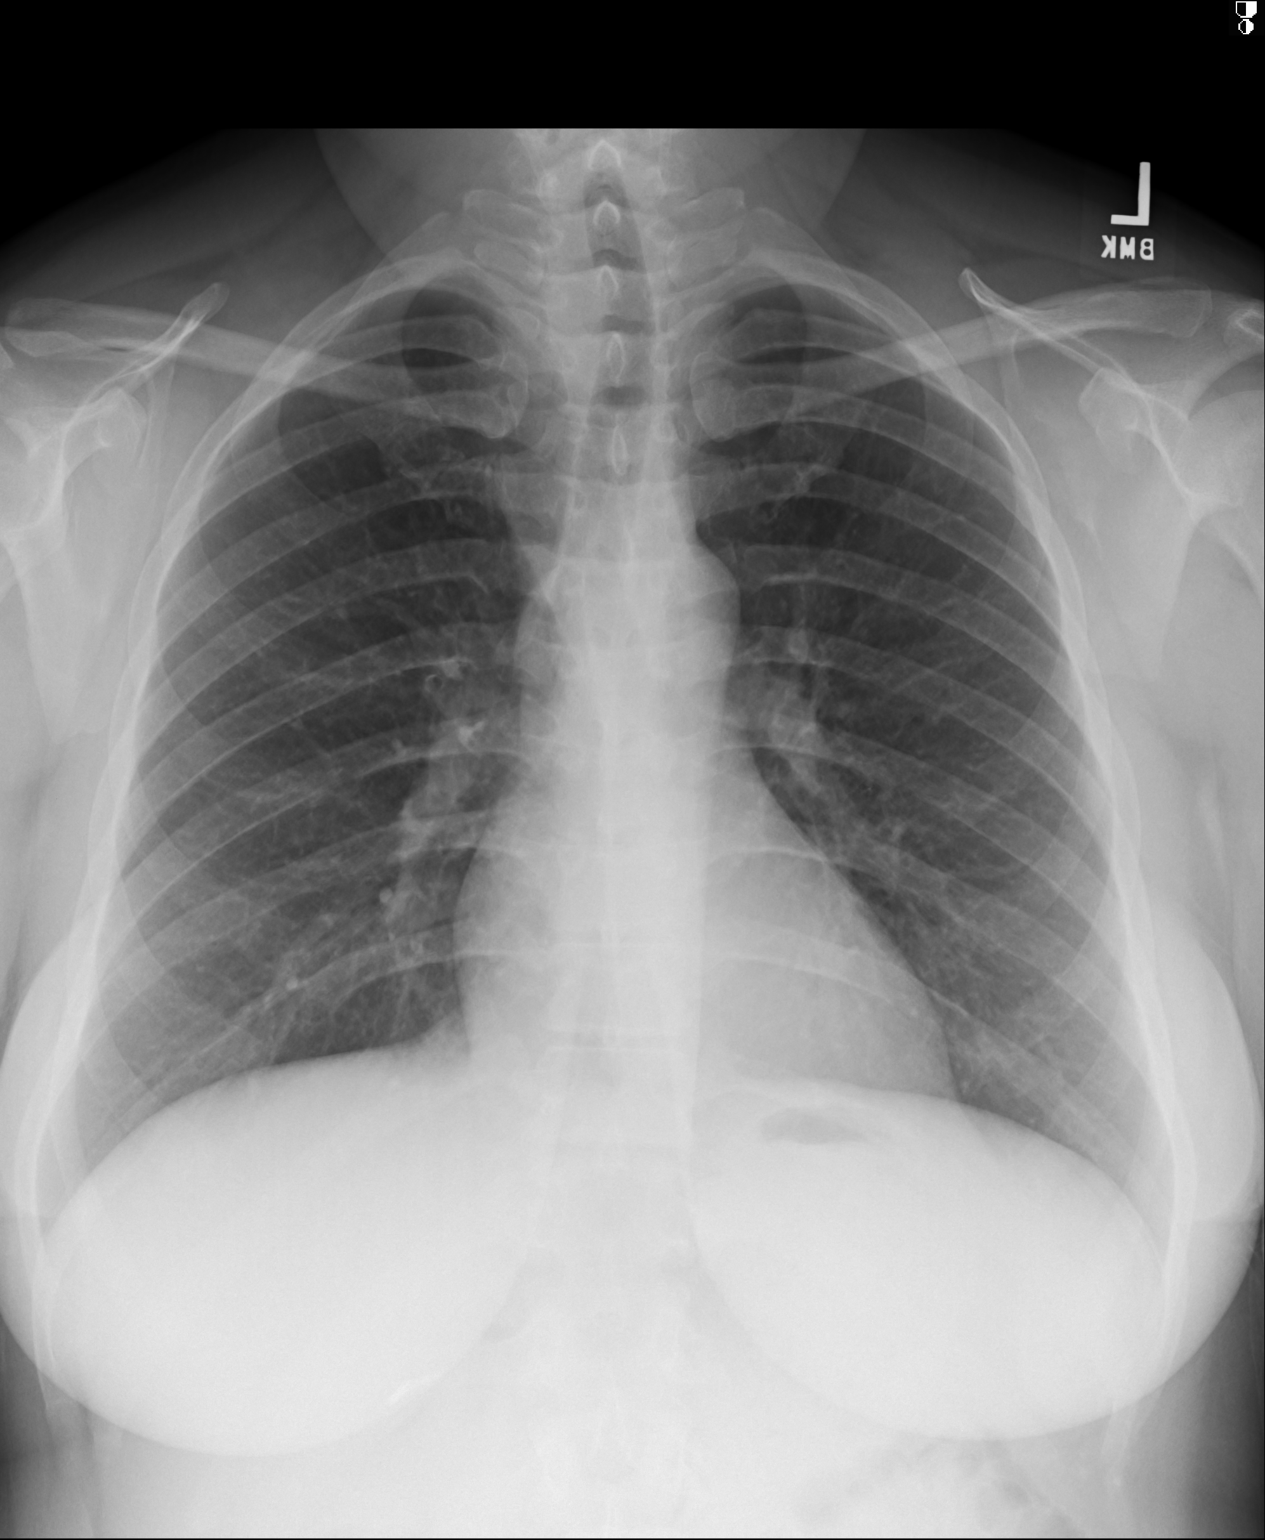

[2 of 2 positions shown; findings below may reference images not displayed]

PROCEDURE:     DXR - DXR CHEST PA (OR AP) AND LATERAL  - [DATE]  [DATE]

RESULT:     Comparison is made to the study [DATE].

The lungs are well-expanded. There is no focal infiltrate. The cardiac
silhouette is normal in size. The mediastinum is normal in width. There is
no pleural effusion or pneumothorax. The observed portions of the bony
thorax are normal.
IMPRESSION: There is no evidence of acute cardiopulmonary abnormality.

[REDACTED]

## 2013-05-08 ENCOUNTER — Observation Stay: Payer: Self-pay | Admitting: Internal Medicine

## 2013-05-08 LAB — LIPID PANEL
Cholesterol: 165 mg/dL (ref 0–200)
HDL Cholesterol: 40 mg/dL (ref 40–60)
Ldl Cholesterol, Calc: 94 mg/dL (ref 0–100)
Triglycerides: 154 mg/dL (ref 0–200)
VLDL Cholesterol, Calc: 31 mg/dL (ref 5–40)

## 2013-05-08 LAB — TROPONIN I
Troponin-I: <0.02 ng/mL
Troponin-I: <0.02 ng/mL

## 2013-05-08 LAB — CK TOTAL AND CKMB (NOT AT ARMC)
CK, Total: 71 U/L (ref 21–215)
CK-MB: <0.5 ng/mL — ABNORMAL LOW (ref 0.5–3.6)

## 2013-07-01 ENCOUNTER — Emergency Department: Payer: Self-pay | Admitting: Emergency Medicine

## 2013-07-01 LAB — CBC
HCT: 34.8 % — ABNORMAL LOW (ref 35.0–47.0)
HGB: 12.4 g/dL (ref 12.0–16.0)
MCH: 30.7 pg (ref 26.0–34.0)
MCHC: 35.6 g/dL (ref 32.0–36.0)
MCV: 86 fL (ref 80–100)
Platelet: 263 10*3/uL (ref 150–440)
RBC: 4.04 10*6/uL (ref 3.80–5.20)
RDW: 13.1 % (ref 11.5–14.5)
WBC: 6.6 10*3/uL (ref 3.6–11.0)

## 2013-07-01 LAB — URINALYSIS, COMPLETE
Bilirubin,UR: NEGATIVE
Blood: NEGATIVE
Glucose,UR: NEGATIVE mg/dL (ref 0–75)
Ketone: NEGATIVE
Leukocyte Esterase: NEGATIVE
Nitrite: NEGATIVE
Ph: 8 (ref 4.5–8.0)
Protein: NEGATIVE
RBC,UR: <1 /HPF (ref 0–5)
Specific Gravity: 1.006 (ref 1.003–1.030)
Squamous Epithelial: 2
WBC UR: <1 /HPF (ref 0–5)

## 2013-07-01 LAB — BASIC METABOLIC PANEL
Anion Gap: 4 — ABNORMAL LOW (ref 7–16)
BUN: 9 mg/dL (ref 7–18)
Calcium, Total: 9.1 mg/dL (ref 8.5–10.1)
Chloride: 106 mmol/L (ref 98–107)
Co2: 28 mmol/L (ref 21–32)
Creatinine: 0.72 mg/dL (ref 0.60–1.30)
EGFR (African American): >60
EGFR (Non-African Amer.): >60
Glucose: 87 mg/dL (ref 65–99)
Osmolality: 274 (ref 275–301)
Potassium: 3.9 mmol/L (ref 3.5–5.1)
Sodium: 138 mmol/L (ref 136–145)

## 2013-07-01 LAB — TROPONIN I
Troponin-I: <0.02 ng/mL
Troponin-I: <0.02 ng/mL

## 2013-07-01 IMAGING — CR DG CHEST 2V
1 series · 2 of 2 positions shown · non-contrast
Comparison: [DATE]

CLINICAL DATA: Chest pain

EXAM:
CHEST  2 VIEW

[Series 1: w chest pa · 0.14mm/px · 2 of 2 slices shown]
[im 1/2]
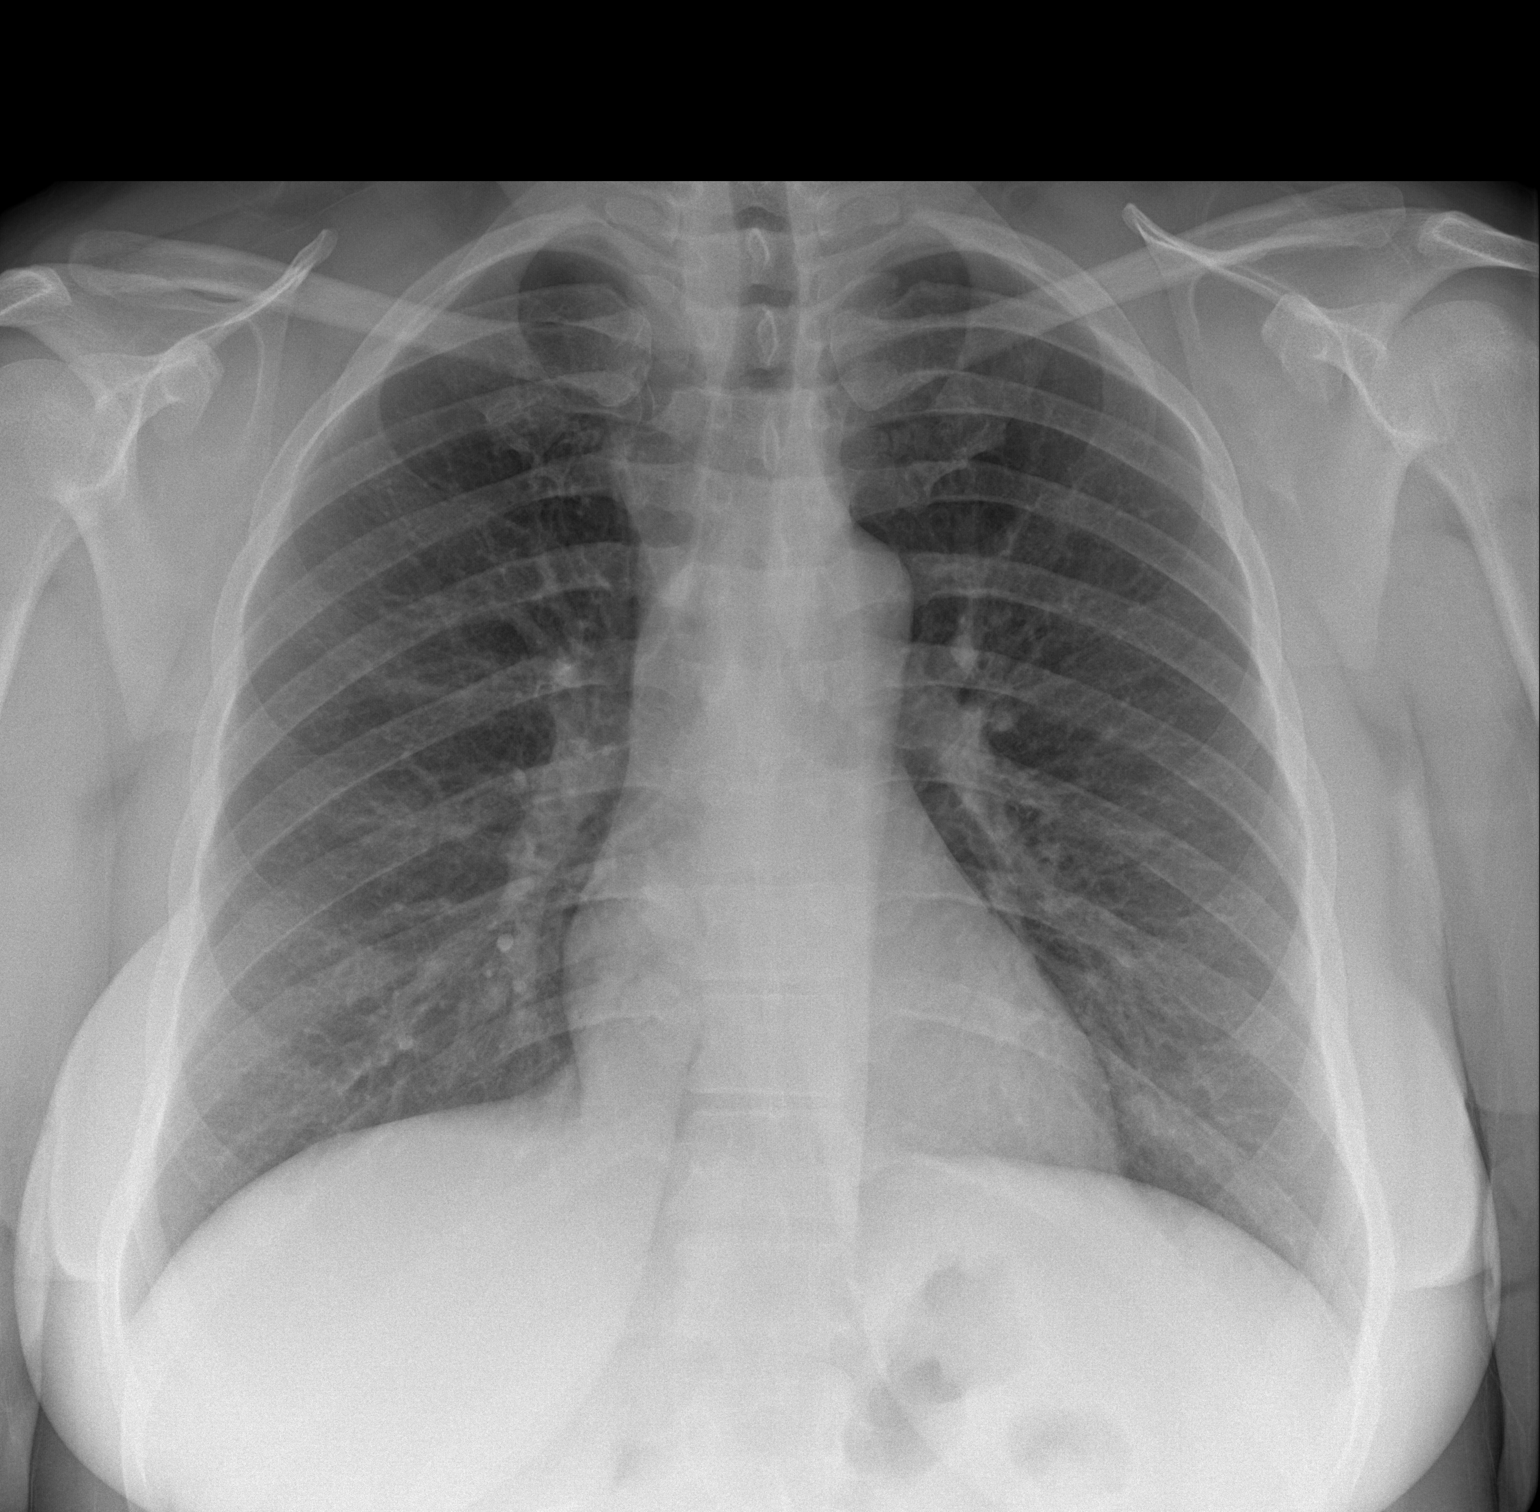
[im 2/2]
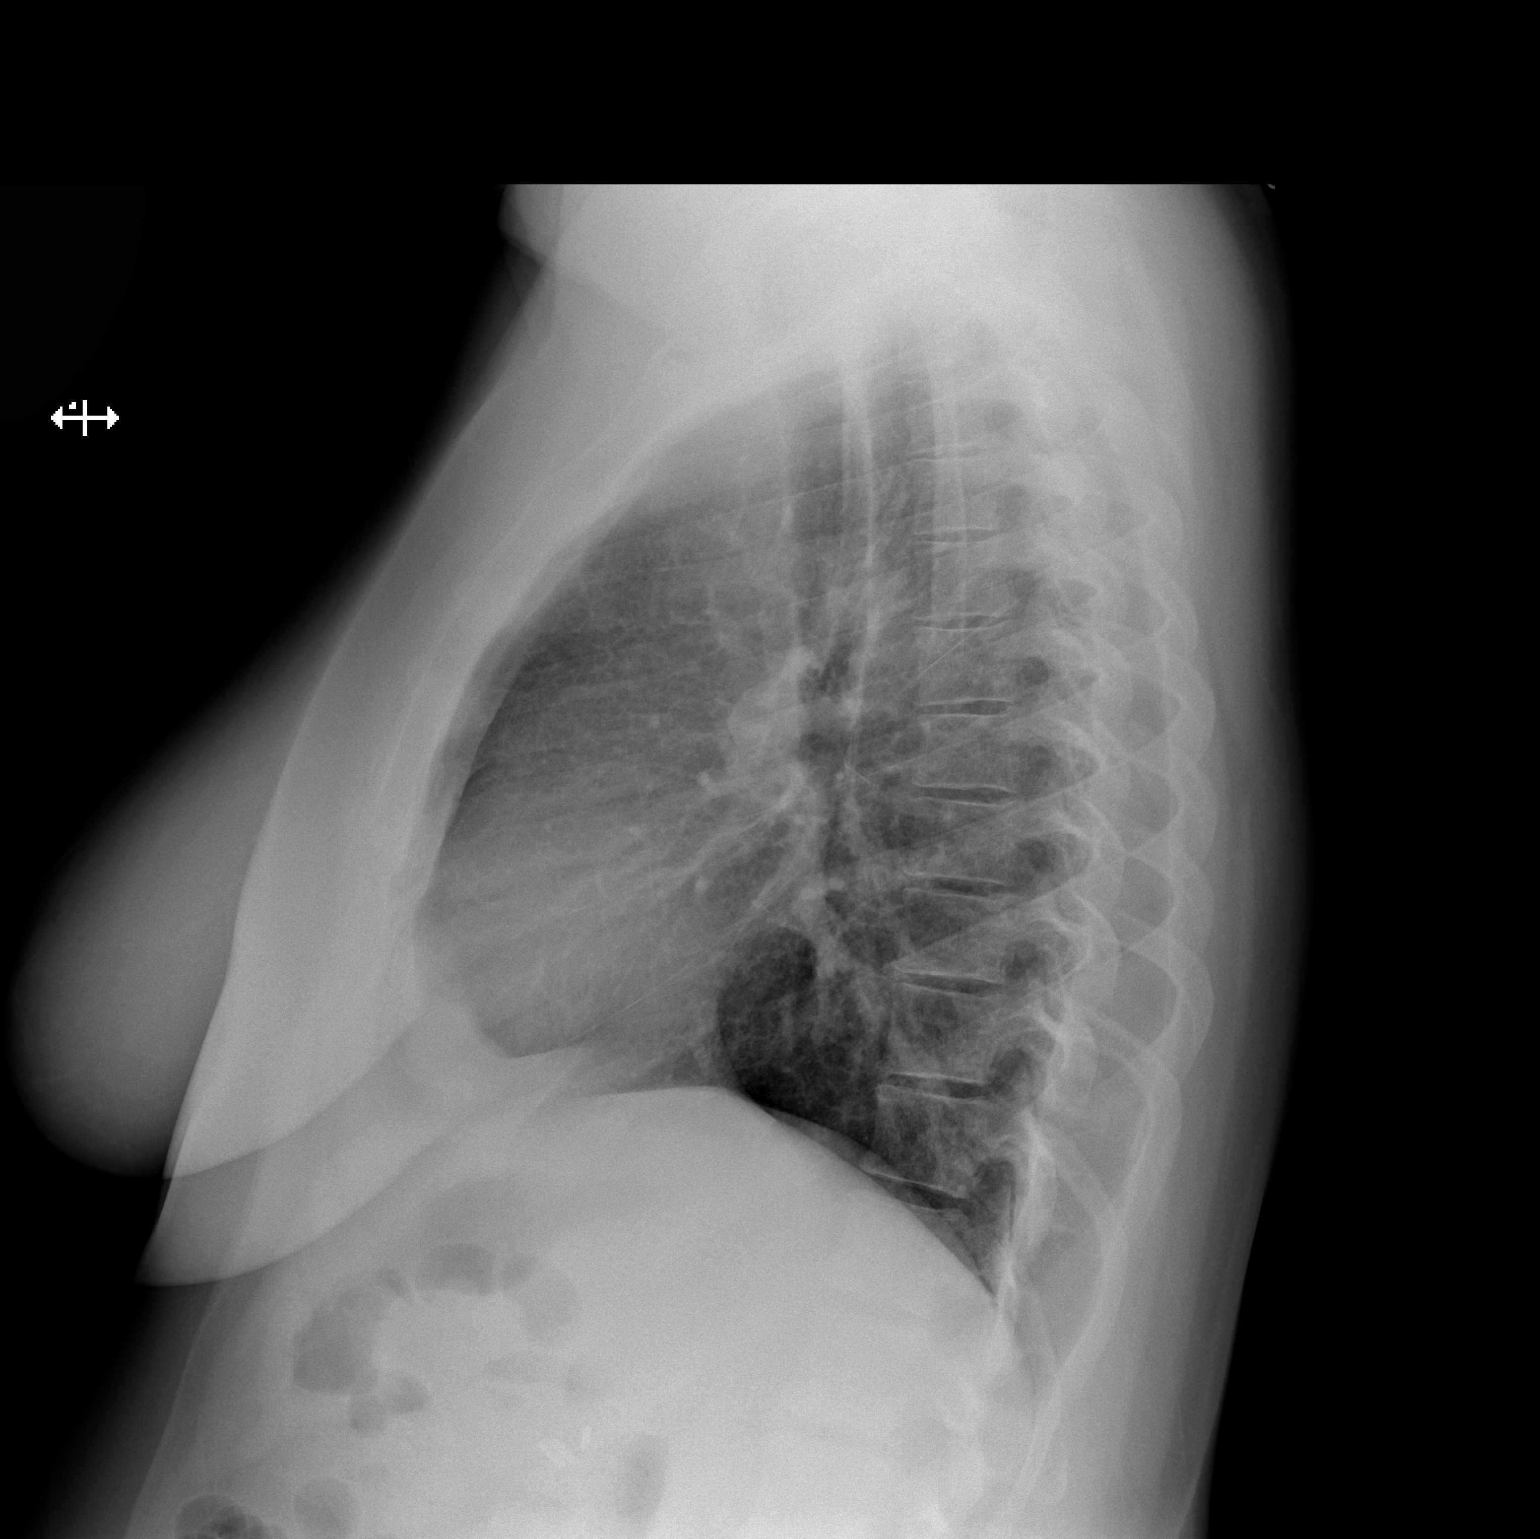

[2 of 2 positions shown; findings below may reference images not displayed]

FINDINGS: The heart size and mediastinal contours are within normal limits.
Both lungs are clear. The visualized skeletal structures are
unremarkable.
IMPRESSION: No active cardiopulmonary disease.

## 2013-09-30 ENCOUNTER — Emergency Department: Payer: Self-pay | Admitting: Emergency Medicine

## 2013-09-30 LAB — CBC WITH DIFFERENTIAL/PLATELET
Basophil #: 0.1 10*3/uL (ref 0.0–0.1)
Basophil %: 0.8 %
Eosinophil #: 0.2 10*3/uL (ref 0.0–0.7)
Eosinophil %: 2.6 %
HCT: 36 % (ref 35.0–47.0)
HGB: 12.3 g/dL (ref 12.0–16.0)
Lymphocyte #: 2.9 10*3/uL (ref 1.0–3.6)
Lymphocyte %: 35.9 %
MCH: 29.9 pg (ref 26.0–34.0)
MCHC: 34 g/dL (ref 32.0–36.0)
MCV: 88 fL (ref 80–100)
Monocyte #: 0.6 x10 3/mm (ref 0.2–0.9)
Monocyte %: 7.3 %
Neutrophil #: 4.2 10*3/uL (ref 1.4–6.5)
Neutrophil %: 53.4 %
Platelet: 247 10*3/uL (ref 150–440)
RBC: 4.1 10*6/uL (ref 3.80–5.20)
RDW: 13.7 % (ref 11.5–14.5)
WBC: 8 10*3/uL (ref 3.6–11.0)

## 2013-09-30 LAB — COMPREHENSIVE METABOLIC PANEL
Albumin: 3.8 g/dL (ref 3.4–5.0)
Alkaline Phosphatase: 37 U/L — ABNORMAL LOW
Anion Gap: 5 — ABNORMAL LOW (ref 7–16)
BUN: 10 mg/dL (ref 7–18)
Bilirubin,Total: 0.3 mg/dL (ref 0.2–1.0)
Calcium, Total: 8.9 mg/dL (ref 8.5–10.1)
Chloride: 107 mmol/L (ref 98–107)
Co2: 28 mmol/L (ref 21–32)
Creatinine: 0.78 mg/dL (ref 0.60–1.30)
EGFR (African American): >60
EGFR (Non-African Amer.): >60
Glucose: 101 mg/dL — ABNORMAL HIGH (ref 65–99)
Osmolality: 279 (ref 275–301)
Potassium: 3.9 mmol/L (ref 3.5–5.1)
SGOT(AST): 16 U/L (ref 15–37)
SGPT (ALT): 35 U/L (ref 12–78)
Sodium: 140 mmol/L (ref 136–145)
Total Protein: 7.6 g/dL (ref 6.4–8.2)

## 2013-09-30 LAB — URINALYSIS, COMPLETE
Bacteria: NONE SEEN
Bilirubin,UR: NEGATIVE
Blood: NEGATIVE
Glucose,UR: NEGATIVE mg/dL (ref 0–75)
Ketone: NEGATIVE
Leukocyte Esterase: NEGATIVE
Nitrite: NEGATIVE
Ph: 7 (ref 4.5–8.0)
Protein: NEGATIVE
RBC,UR: <1 /HPF (ref 0–5)
Specific Gravity: 1.011 (ref 1.003–1.030)
Squamous Epithelial: NONE SEEN
WBC UR: NONE SEEN /HPF (ref 0–5)

## 2013-10-01 IMAGING — CT CT ANGIO HEAD-NECK
2 of 9 series · 7 of 33 positions shown · IV contrast (isovue)
Comparison: [DATE]

CLINICAL DATA: Right occipital headache and neck pain. Right facial
numbness. Neurologic family history

EXAM:
CT ANGIOGRAPHY HEAD AND NECK
TECHNIQUE: Multidetector CT imaging of the head and neck was performed using
the standard protocol during bolus administration of intravenous
contrast. Multiplanar CT image reconstructions including MIPs were
obtained to evaluate the vascular anatomy. Carotid stenosis
measurements (when applicable) are obtained utilizing NASCET
criteria, using the distal internal carotid diameter as the
denominator.
CONTRAST:  100 cc Isovue 370 intravenous.

[Series 8: cta head · axial · 0.42mm/px · z∈[-349,-177]mm · 3 of 344 slices shown]
[im 86/344  soft-tissue]
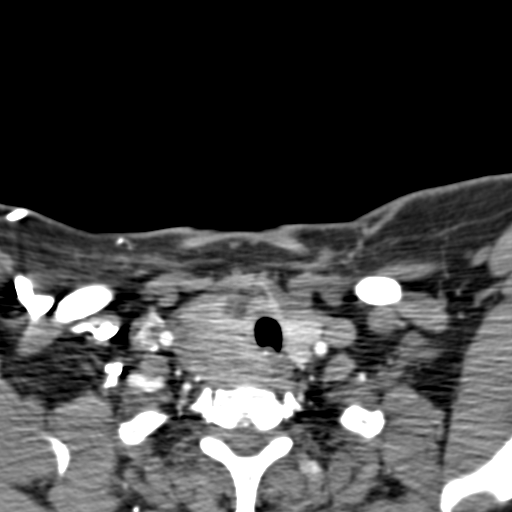
[im 172/344  bone]
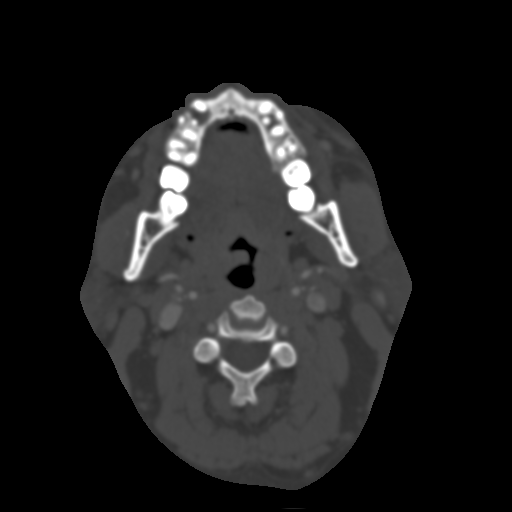
[im 258/344  soft-tissue]
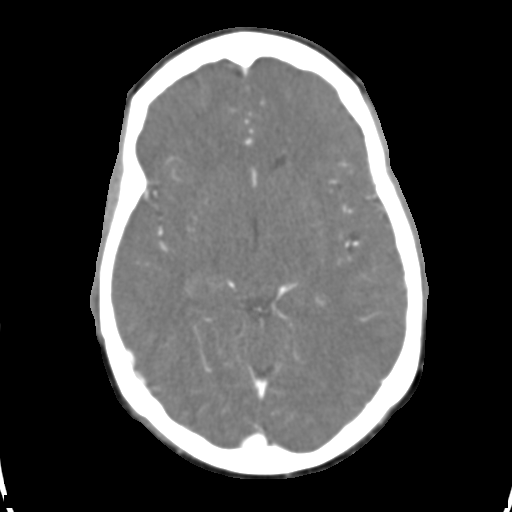

[Series 9: ax thin · axial · 0.41mm/px · z∈[-366,-163]mm · 4 of 339 slices shown]
[im 68/339  soft-tissue]
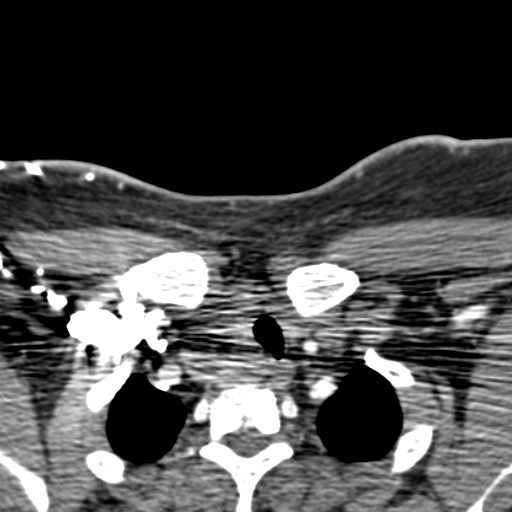
[im 136/339  soft-tissue]
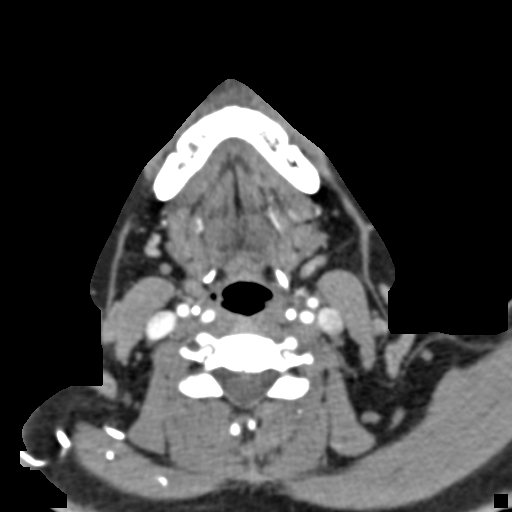
[im 203/339  soft-tissue]
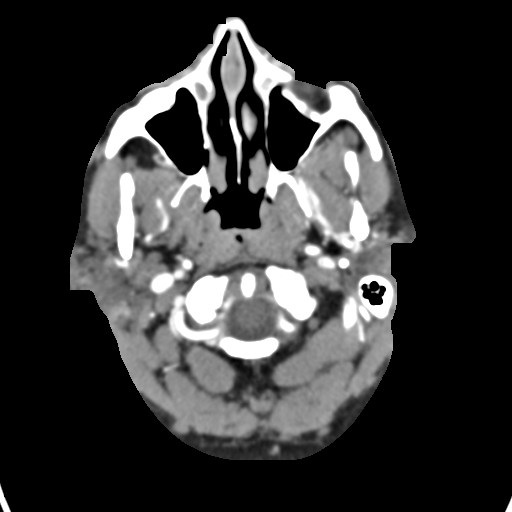
[im 271/339  soft-tissue]
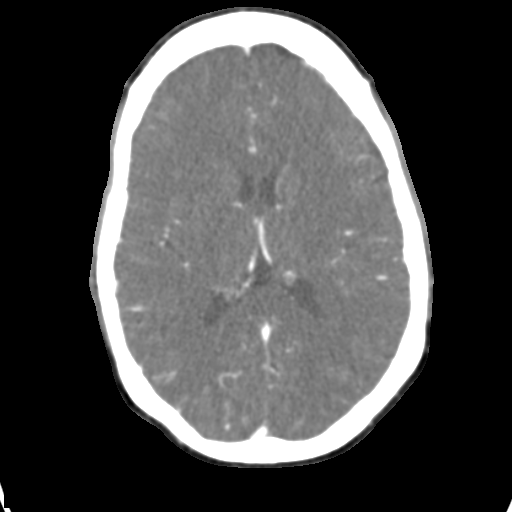

[7 of 33 positions shown; findings below may reference images not displayed]

FINDINGS: CTA HEAD FINDINGS

Skull and Sinuses:No significant abnormality.

Orbits: No acute abnormality.

Brain: No evidence of acute abnormality, such as acute infarction,
hemorrhage, hydrocephalus, or mass lesion/mass effect. No abnormal
intracranial enhancement on delayed imaging. No asymmetry of the
skullbase foramina to explain the facial numbness.

Standard intracranial anatomy. No significant stenosis. There is a 2
mm, chronically shaped outpouching from the right supraclinoid
carotid artery, directed posteriorly and inferiorly. There is a tiny
right posterior communicating artery which is directed towards the
outpouching.

Review of the MIP images confirms the above findings.

CTA NECK FINDINGS

The imaged aortic arch is non aneurysmal and without dissection or
other acute abnormality. Standard arch branching pattern. There is
no acute abnormality within the carotid or vertebral artery systems.
No significant atherosclerosis. No stenosis.

There is a large heterogeneous nodule within the right thyroid gland
measuring up to 3.8 cm. The nodule involves the majority of the
right lobe, and extends into the upper mediastinum. The trachea is
mildly compressed, and displaced to the left. No adenopathy.

Review of the MIP images confirms the above findings.
IMPRESSION: 1. No acute intracranial abnormality. No acute arterial abnormality.
2. 2 mm right supraclinoid carotid outpouching, favor posterior
communicating artery infundibulum over aneurysm. This finding is
incidental relative to the history of right occipital pain.
3. 4 cm mass in the right thyroid gland. Recommend outpatient
thyroid sonography.

## 2013-12-07 ENCOUNTER — Emergency Department: Payer: Self-pay | Admitting: Emergency Medicine

## 2013-12-07 LAB — CBC
HCT: 36.2 % (ref 35.0–47.0)
HGB: 12.3 g/dL (ref 12.0–16.0)
MCH: 29.8 pg (ref 26.0–34.0)
MCHC: 34.1 g/dL (ref 32.0–36.0)
MCV: 88 fL (ref 80–100)
Platelet: 268 10*3/uL (ref 150–440)
RBC: 4.13 10*6/uL (ref 3.80–5.20)
RDW: 13.7 % (ref 11.5–14.5)
WBC: 6.5 10*3/uL (ref 3.6–11.0)

## 2013-12-07 LAB — COMPREHENSIVE METABOLIC PANEL
Albumin: 3.8 g/dL (ref 3.4–5.0)
Alkaline Phosphatase: 33 U/L — ABNORMAL LOW
Anion Gap: 7 (ref 7–16)
BUN: 8 mg/dL (ref 7–18)
Bilirubin,Total: 0.5 mg/dL (ref 0.2–1.0)
Calcium, Total: 9 mg/dL (ref 8.5–10.1)
Chloride: 107 mmol/L (ref 98–107)
Co2: 25 mmol/L (ref 21–32)
Creatinine: 0.62 mg/dL (ref 0.60–1.30)
EGFR (African American): >60
EGFR (Non-African Amer.): >60
Glucose: 92 mg/dL (ref 65–99)
Osmolality: 276 (ref 275–301)
Potassium: 4 mmol/L (ref 3.5–5.1)
SGOT(AST): 25 U/L (ref 15–37)
SGPT (ALT): 33 U/L (ref 12–78)
Sodium: 139 mmol/L (ref 136–145)
Total Protein: 7.7 g/dL (ref 6.4–8.2)

## 2013-12-07 LAB — TROPONIN I: Troponin-I: <0.02 ng/mL

## 2013-12-07 IMAGING — CR DG CHEST 2V
1 series · 2 of 2 positions shown · non-contrast
Comparison: DG CHEST 2V dated [DATE]

CLINICAL DATA: Left-sided chest pain and altered mental status.

EXAM:
CHEST - 2 VIEW

[Series 1: w chest pa · 0.14mm/px · 2 of 2 slices shown]
[im 1/2]
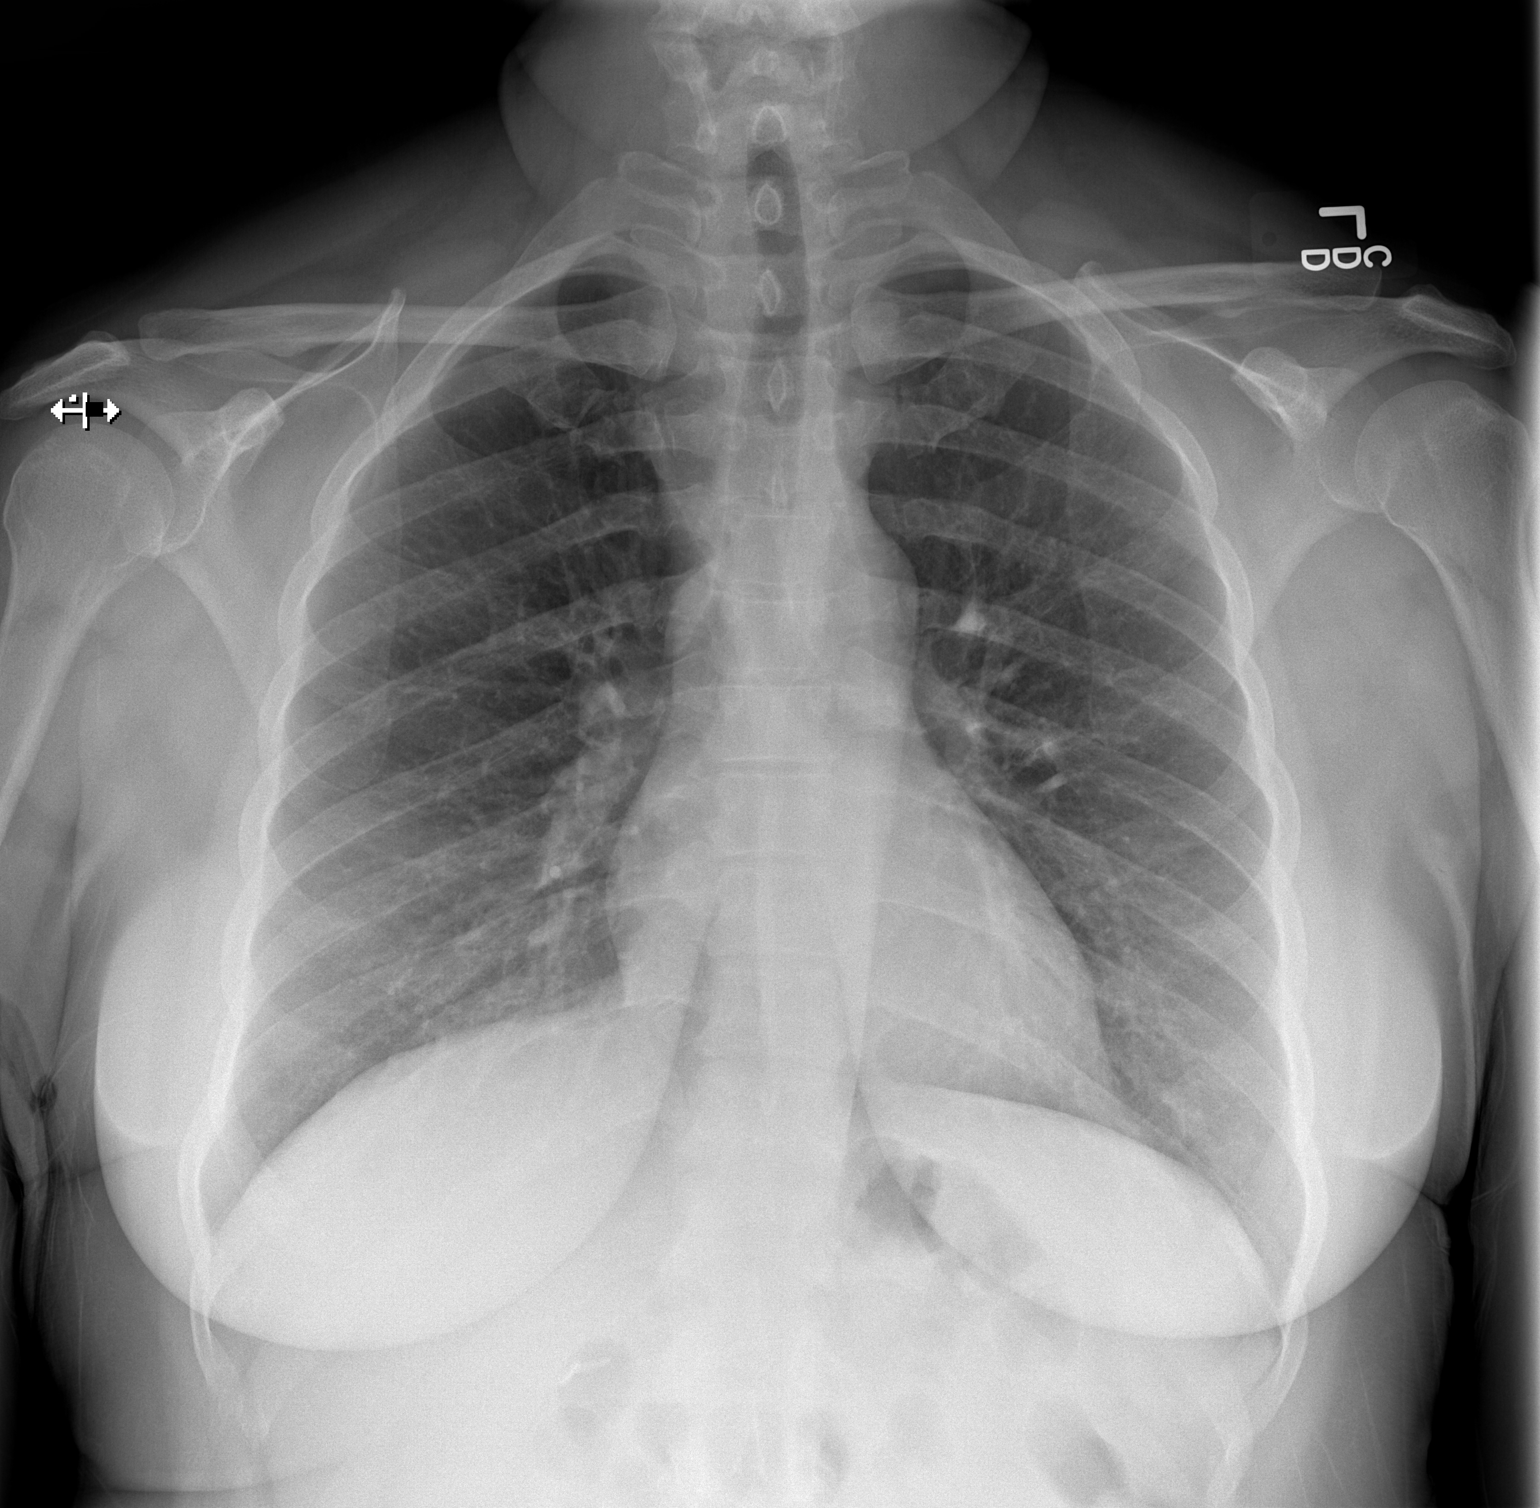
[im 2/2]
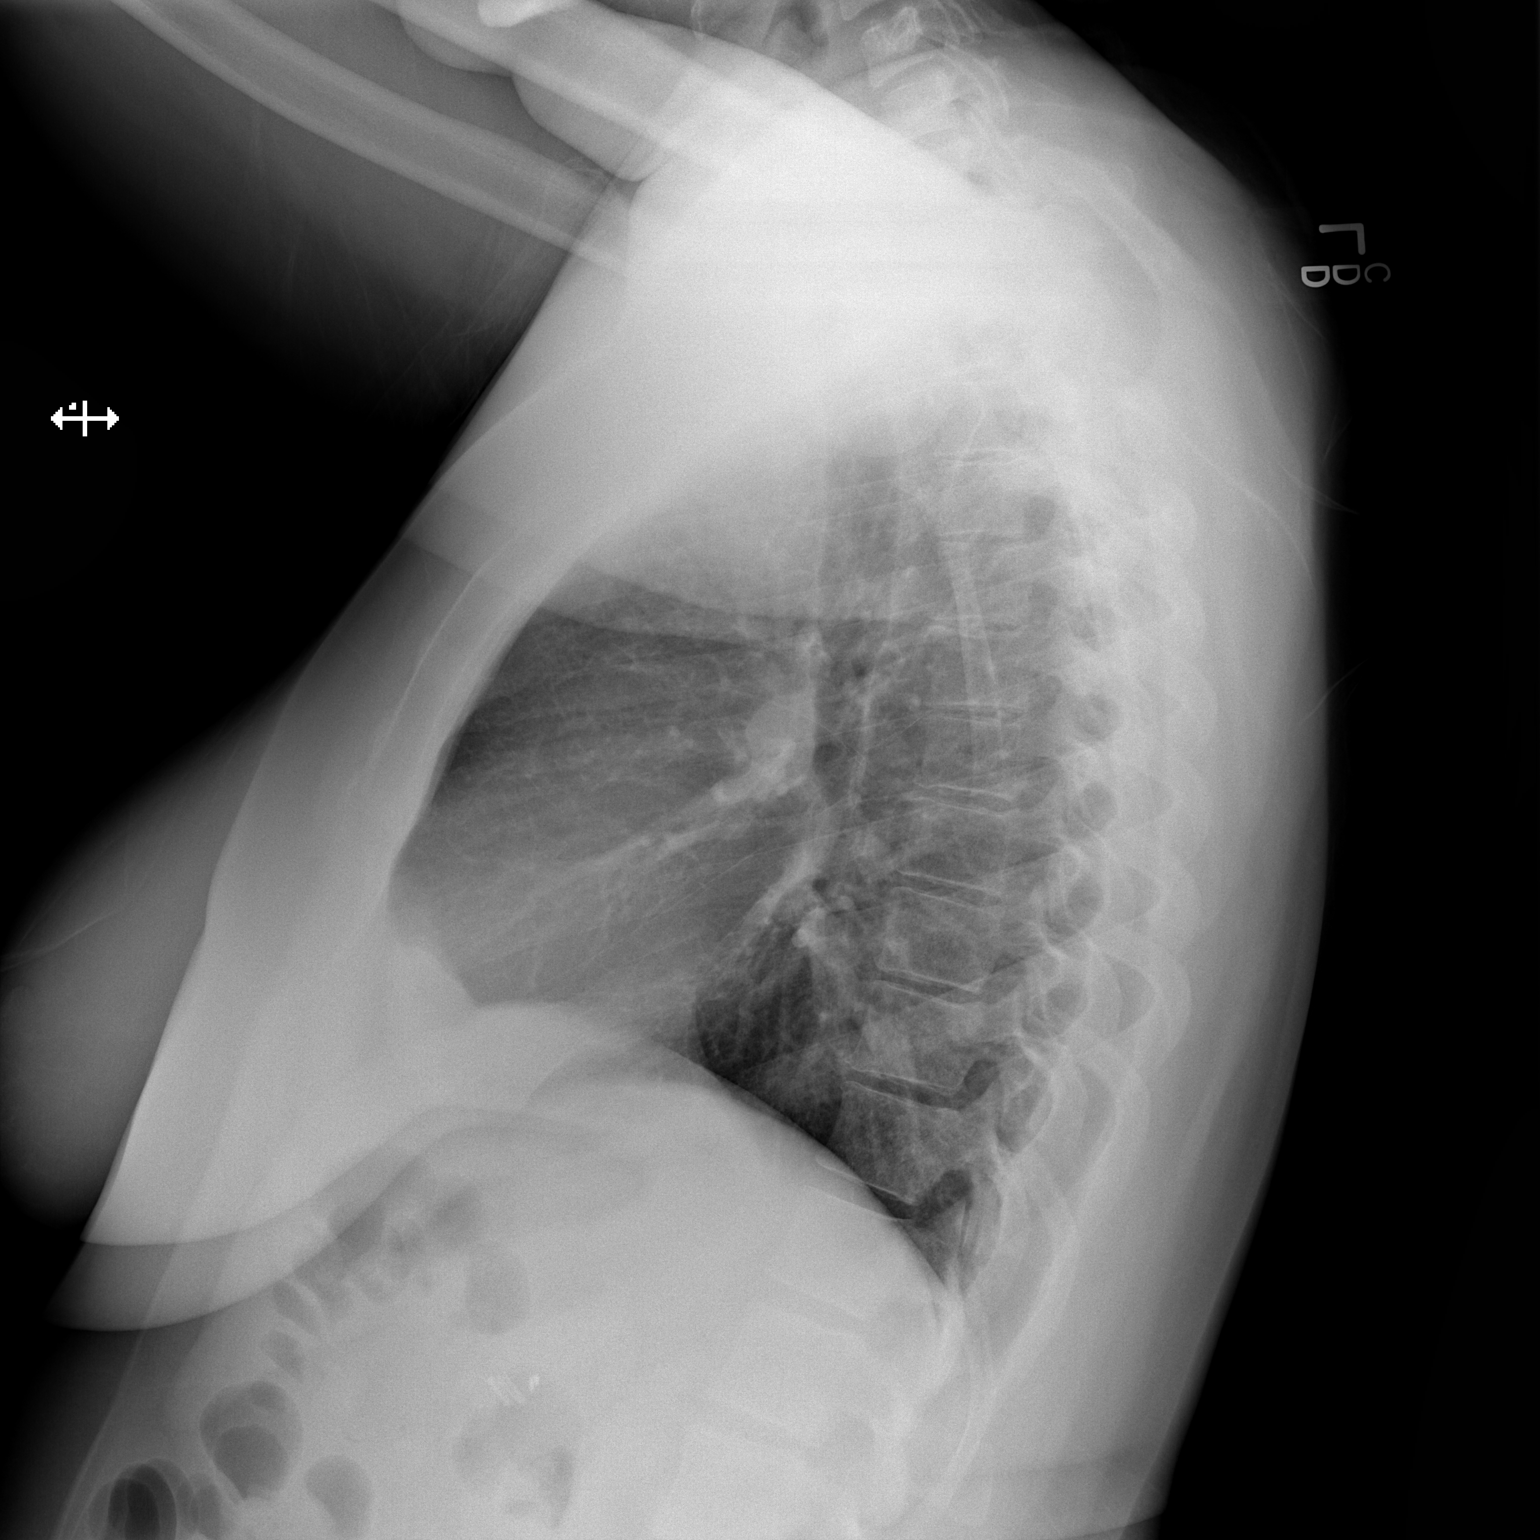

[2 of 2 positions shown; findings below may reference images not displayed]

FINDINGS: The heart size and mediastinal contours are within normal limits.
There is no evidence of pulmonary edema, consolidation,
pneumothorax, nodule or pleural fluid. The visualized skeletal
structures are unremarkable.
IMPRESSION: No active disease.

## 2013-12-22 ENCOUNTER — Emergency Department: Payer: Self-pay | Admitting: Emergency Medicine

## 2013-12-22 LAB — CBC
HCT: 37.4 % (ref 35.0–47.0)
HGB: 12.8 g/dL (ref 12.0–16.0)
MCH: 30.2 pg (ref 26.0–34.0)
MCHC: 34.2 g/dL (ref 32.0–36.0)
MCV: 88 fL (ref 80–100)
Platelet: 259 10*3/uL (ref 150–440)
RBC: 4.24 10*6/uL (ref 3.80–5.20)
RDW: 13.5 % (ref 11.5–14.5)
WBC: 6.4 10*3/uL (ref 3.6–11.0)

## 2013-12-22 LAB — BASIC METABOLIC PANEL
Anion Gap: 4 — ABNORMAL LOW (ref 7–16)
BUN: 9 mg/dL (ref 7–18)
Calcium, Total: 9.6 mg/dL (ref 8.5–10.1)
Chloride: 106 mmol/L (ref 98–107)
Co2: 25 mmol/L (ref 21–32)
Creatinine: 0.62 mg/dL (ref 0.60–1.30)
EGFR (African American): >60
EGFR (Non-African Amer.): >60
Glucose: 105 mg/dL — ABNORMAL HIGH (ref 65–99)
Osmolality: 269 (ref 275–301)
Potassium: 3.8 mmol/L (ref 3.5–5.1)
Sodium: 135 mmol/L — ABNORMAL LOW (ref 136–145)

## 2013-12-22 LAB — TROPONIN I: Troponin-I: <0.02 ng/mL

## 2013-12-24 DIAGNOSIS — E042 Nontoxic multinodular goiter: Secondary | ICD-10-CM | POA: Insufficient documentation

## 2014-01-21 DIAGNOSIS — O24419 Gestational diabetes mellitus in pregnancy, unspecified control: Secondary | ICD-10-CM | POA: Insufficient documentation

## 2014-01-21 DIAGNOSIS — J351 Hypertrophy of tonsils: Secondary | ICD-10-CM | POA: Insufficient documentation

## 2014-01-21 DIAGNOSIS — K219 Gastro-esophageal reflux disease without esophagitis: Secondary | ICD-10-CM | POA: Insufficient documentation

## 2014-01-21 DIAGNOSIS — Z8632 Personal history of gestational diabetes: Secondary | ICD-10-CM | POA: Insufficient documentation

## 2014-02-11 ENCOUNTER — Emergency Department: Payer: Self-pay | Admitting: Emergency Medicine

## 2014-02-11 LAB — CBC
HCT: 37.8 % (ref 35.0–47.0)
HGB: 12.6 g/dL (ref 12.0–16.0)
MCH: 29.5 pg (ref 26.0–34.0)
MCHC: 33.2 g/dL (ref 32.0–36.0)
MCV: 89 fL (ref 80–100)
Platelet: 298 10*3/uL (ref 150–440)
RBC: 4.27 10*6/uL (ref 3.80–5.20)
RDW: 13.3 % (ref 11.5–14.5)
WBC: 7.2 10*3/uL (ref 3.6–11.0)

## 2014-02-11 LAB — BASIC METABOLIC PANEL
Anion Gap: 5 — ABNORMAL LOW (ref 7–16)
BUN: 7 mg/dL (ref 7–18)
Calcium, Total: 8.8 mg/dL (ref 8.5–10.1)
Chloride: 106 mmol/L (ref 98–107)
Co2: 25 mmol/L (ref 21–32)
Creatinine: 0.71 mg/dL (ref 0.60–1.30)
EGFR (African American): >60
EGFR (Non-African Amer.): >60
Glucose: 86 mg/dL (ref 65–99)
Osmolality: 269 (ref 275–301)
Potassium: 3.9 mmol/L (ref 3.5–5.1)
Sodium: 136 mmol/L (ref 136–145)

## 2014-02-11 LAB — TROPONIN I: Troponin-I: <0.02 ng/mL

## 2014-02-11 LAB — TSH: Thyroid Stimulating Horm: 0.153 u[IU]/mL — ABNORMAL LOW

## 2014-02-11 IMAGING — CT CT HEAD WITHOUT CONTRAST
1 series · 16 of 30 positions shown, 20 images · non-contrast
Comparison: [DATE].

CLINICAL DATA: Left-sided facial paresthesias.  Facial numbness.

EXAM:
CT HEAD WITHOUT CONTRAST
TECHNIQUE: Contiguous axial images were obtained from the base of the skull
through the vertex without intravenous contrast.

[Series 2: head wo · axial · 0.39mm/px · z∈[-158,-28]mm · 16 of 30 slices shown, 20 images]
[im 2/30  brain]
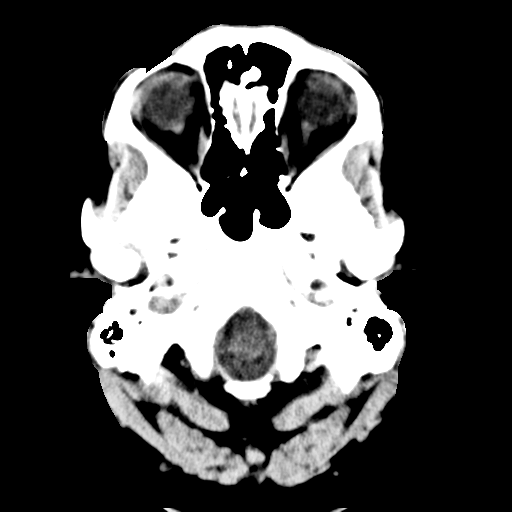
[im 2/30  bone]
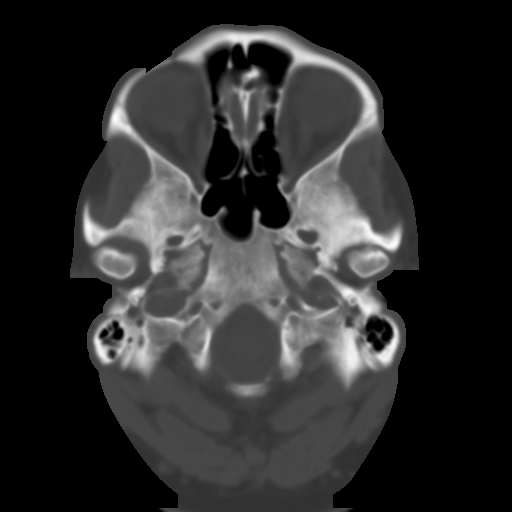
[im 4/30  brain]
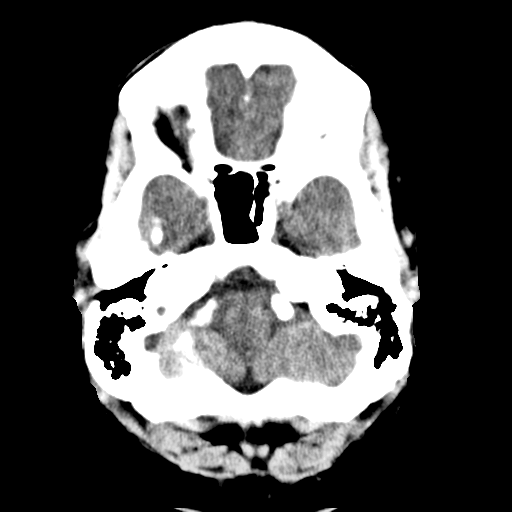
[im 6/30  brain]
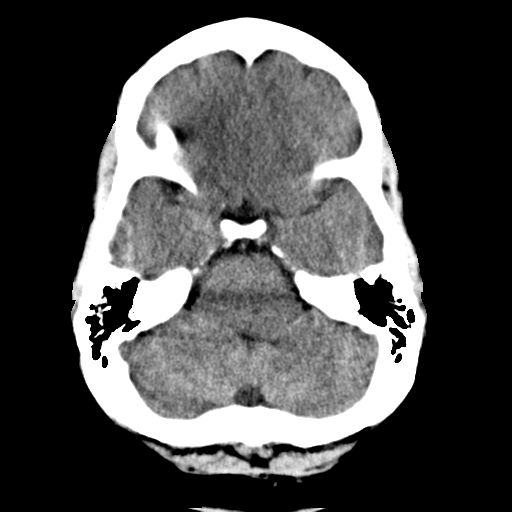
[im 8/30  brain]
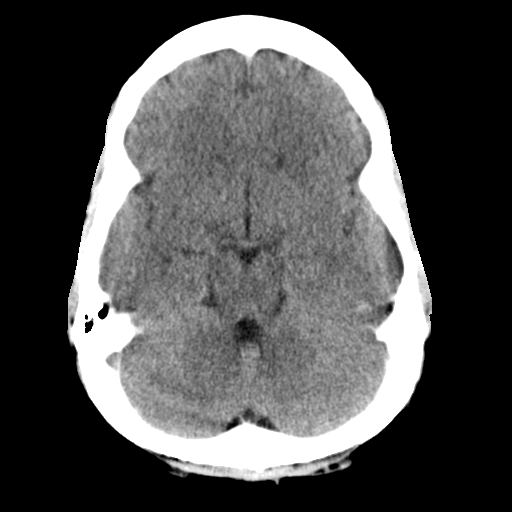
[im 9/30  brain]
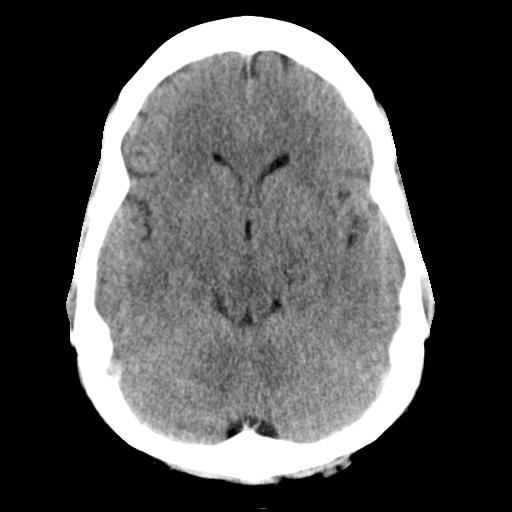
[im 9/30  bone]
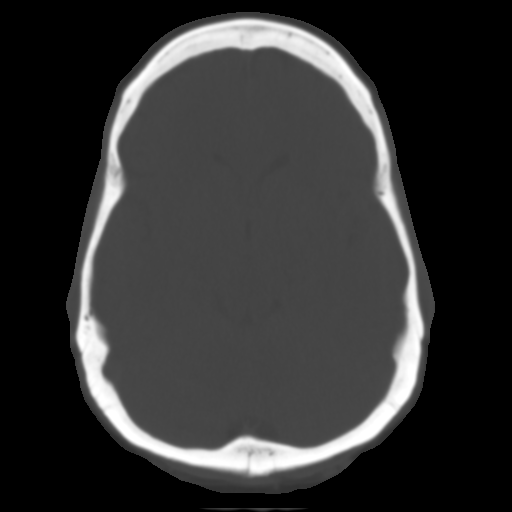
[im 11/30  brain]
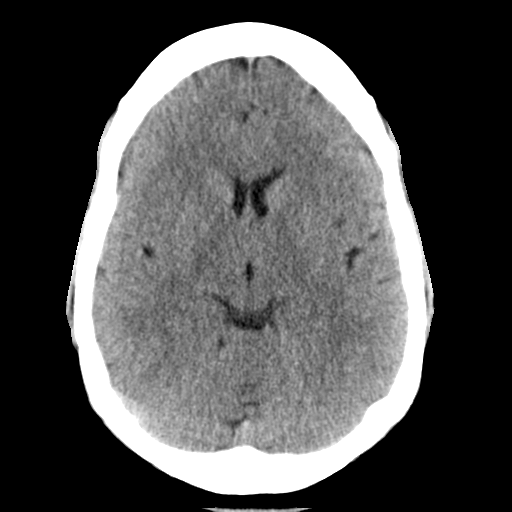
[im 13/30  brain]
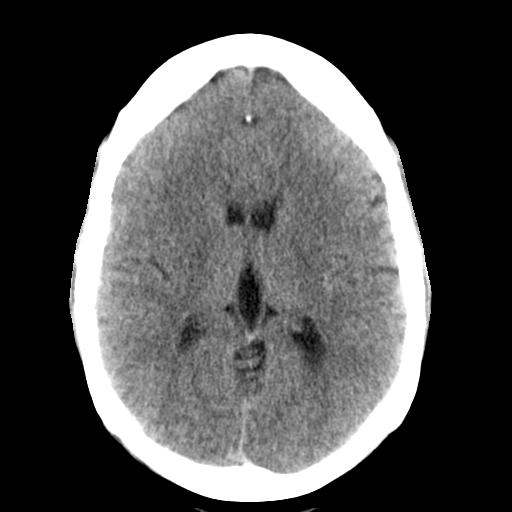
[im 15/30  brain]
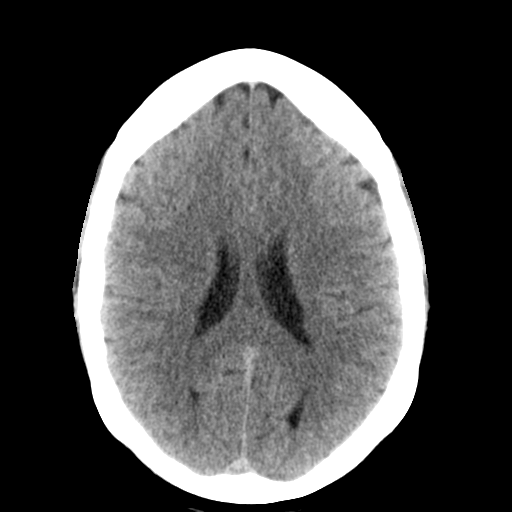
[im 16/30  brain]
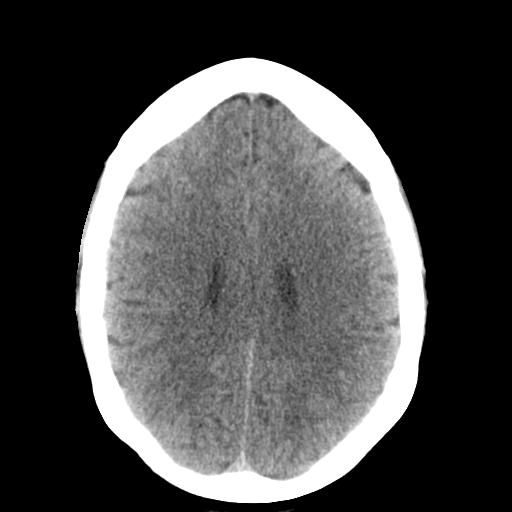
[im 16/30  bone]
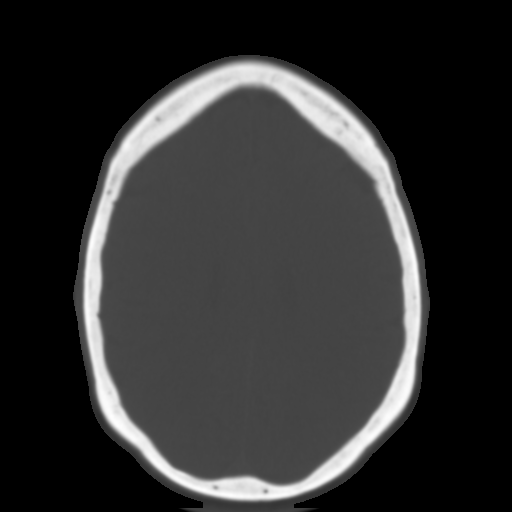
[im 18/30  brain]
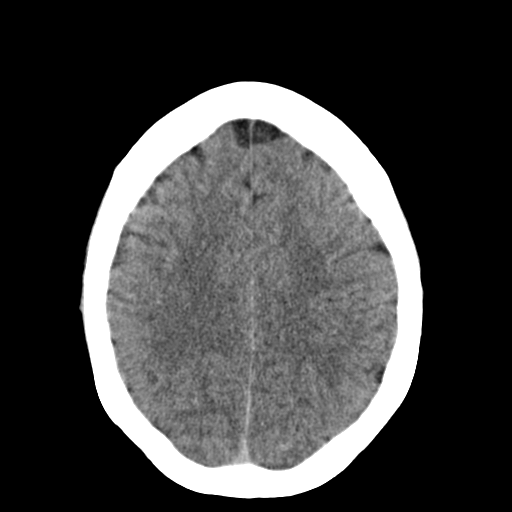
[im 20/30  brain]
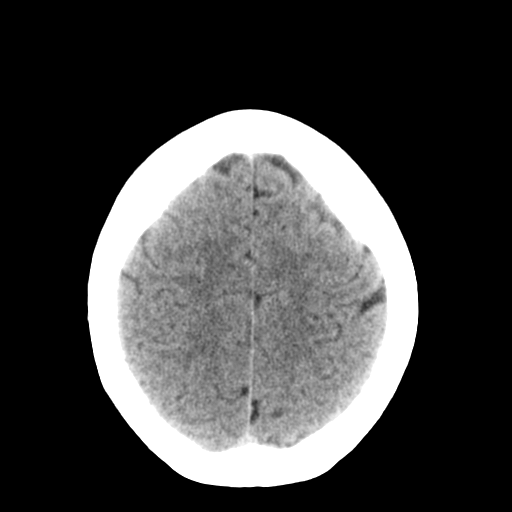
[im 22/30  brain]
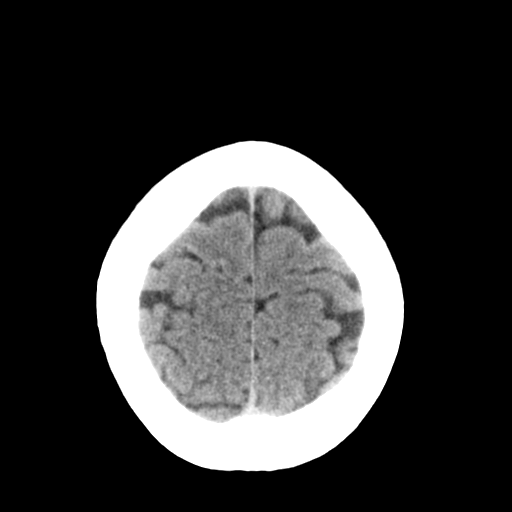
[im 23/30  brain]
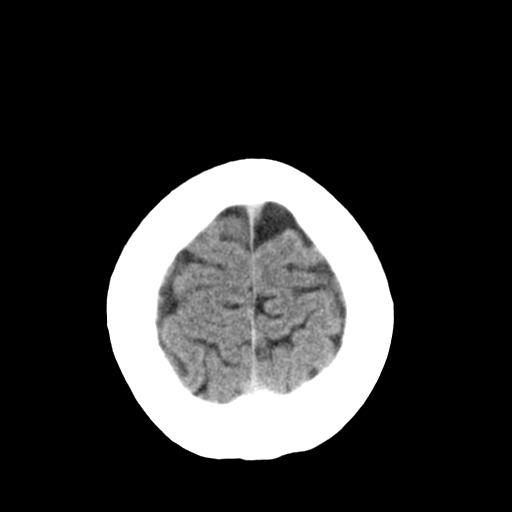
[im 23/30  bone]
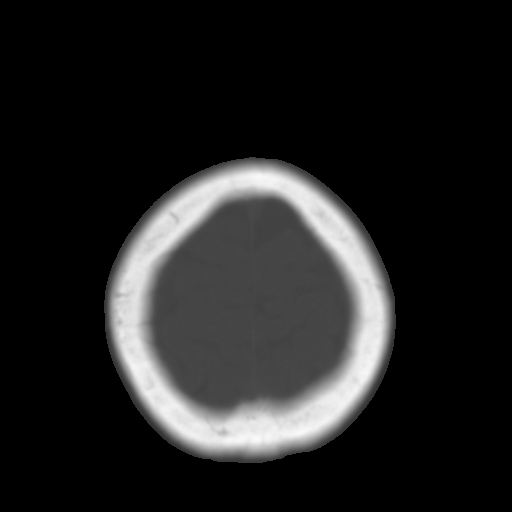
[im 25/30  brain]
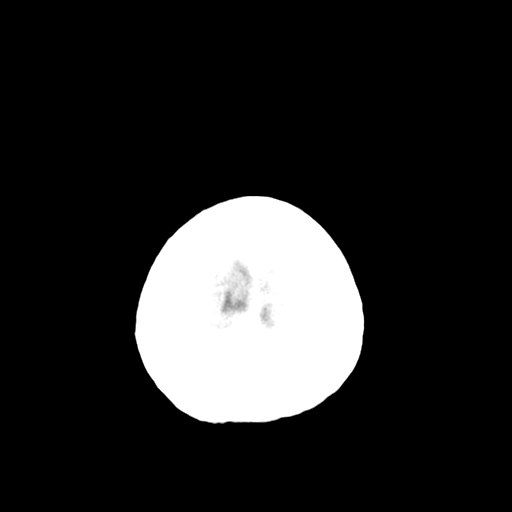
[im 27/30  brain]
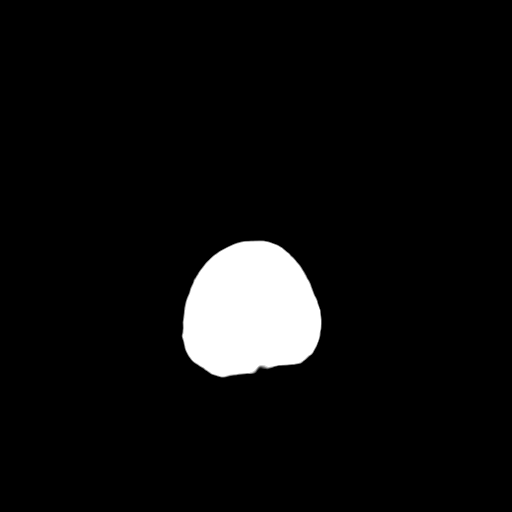
[im 29/30  brain]
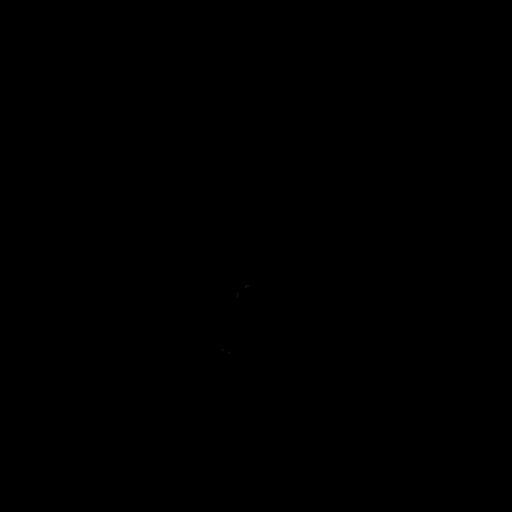

[16 of 30 positions shown; findings below may reference images not displayed]

FINDINGS: No mass lesion, mass effect, midline shift, hydrocephalus,
hemorrhage. No territorial ischemia or acute infarction. Calvarium
intact. Mastoid air cells and visualized paranasal sinuses appear
normal.
IMPRESSION: Negative CT head.

## 2014-05-06 ENCOUNTER — Emergency Department: Payer: Self-pay | Admitting: Emergency Medicine

## 2014-05-06 LAB — COMPREHENSIVE METABOLIC PANEL
Albumin: 3.7 g/dL (ref 3.4–5.0)
Alkaline Phosphatase: 32 U/L — ABNORMAL LOW
Anion Gap: 7 (ref 7–16)
BUN: 9 mg/dL (ref 7–18)
Bilirubin,Total: 0.3 mg/dL (ref 0.2–1.0)
Calcium, Total: 8.8 mg/dL (ref 8.5–10.1)
Chloride: 107 mmol/L (ref 98–107)
Co2: 26 mmol/L (ref 21–32)
Creatinine: 0.81 mg/dL (ref 0.60–1.30)
EGFR (African American): >60
EGFR (Non-African Amer.): >60
Glucose: 91 mg/dL (ref 65–99)
Osmolality: 278 (ref 275–301)
Potassium: 4 mmol/L (ref 3.5–5.1)
SGOT(AST): 20 U/L (ref 15–37)
SGPT (ALT): 24 U/L
Sodium: 140 mmol/L (ref 136–145)
Total Protein: 7.7 g/dL (ref 6.4–8.2)

## 2014-05-06 LAB — CBC
HCT: 37.5 % (ref 35.0–47.0)
HGB: 12.5 g/dL (ref 12.0–16.0)
MCH: 29.9 pg (ref 26.0–34.0)
MCHC: 33.4 g/dL (ref 32.0–36.0)
MCV: 89 fL (ref 80–100)
Platelet: 270 10*3/uL (ref 150–440)
RBC: 4.19 10*6/uL (ref 3.80–5.20)
RDW: 13.5 % (ref 11.5–14.5)
WBC: 8.4 10*3/uL (ref 3.6–11.0)

## 2014-05-06 LAB — TROPONIN I: Troponin-I: <0.02 ng/mL

## 2014-05-06 LAB — CK TOTAL AND CKMB (NOT AT ARMC)
CK, Total: 142 U/L
CK-MB: 1.3 ng/mL (ref 0.5–3.6)

## 2014-05-06 IMAGING — CR DG CHEST 2V
1 series · 2 of 2 positions shown · non-contrast
Comparison: [DATE]

CLINICAL DATA: Chest pain

EXAM:
CHEST  2 VIEW

[Series 1: w chest pa · 0.14mm/px · 2 of 2 slices shown]
[im 1/2]
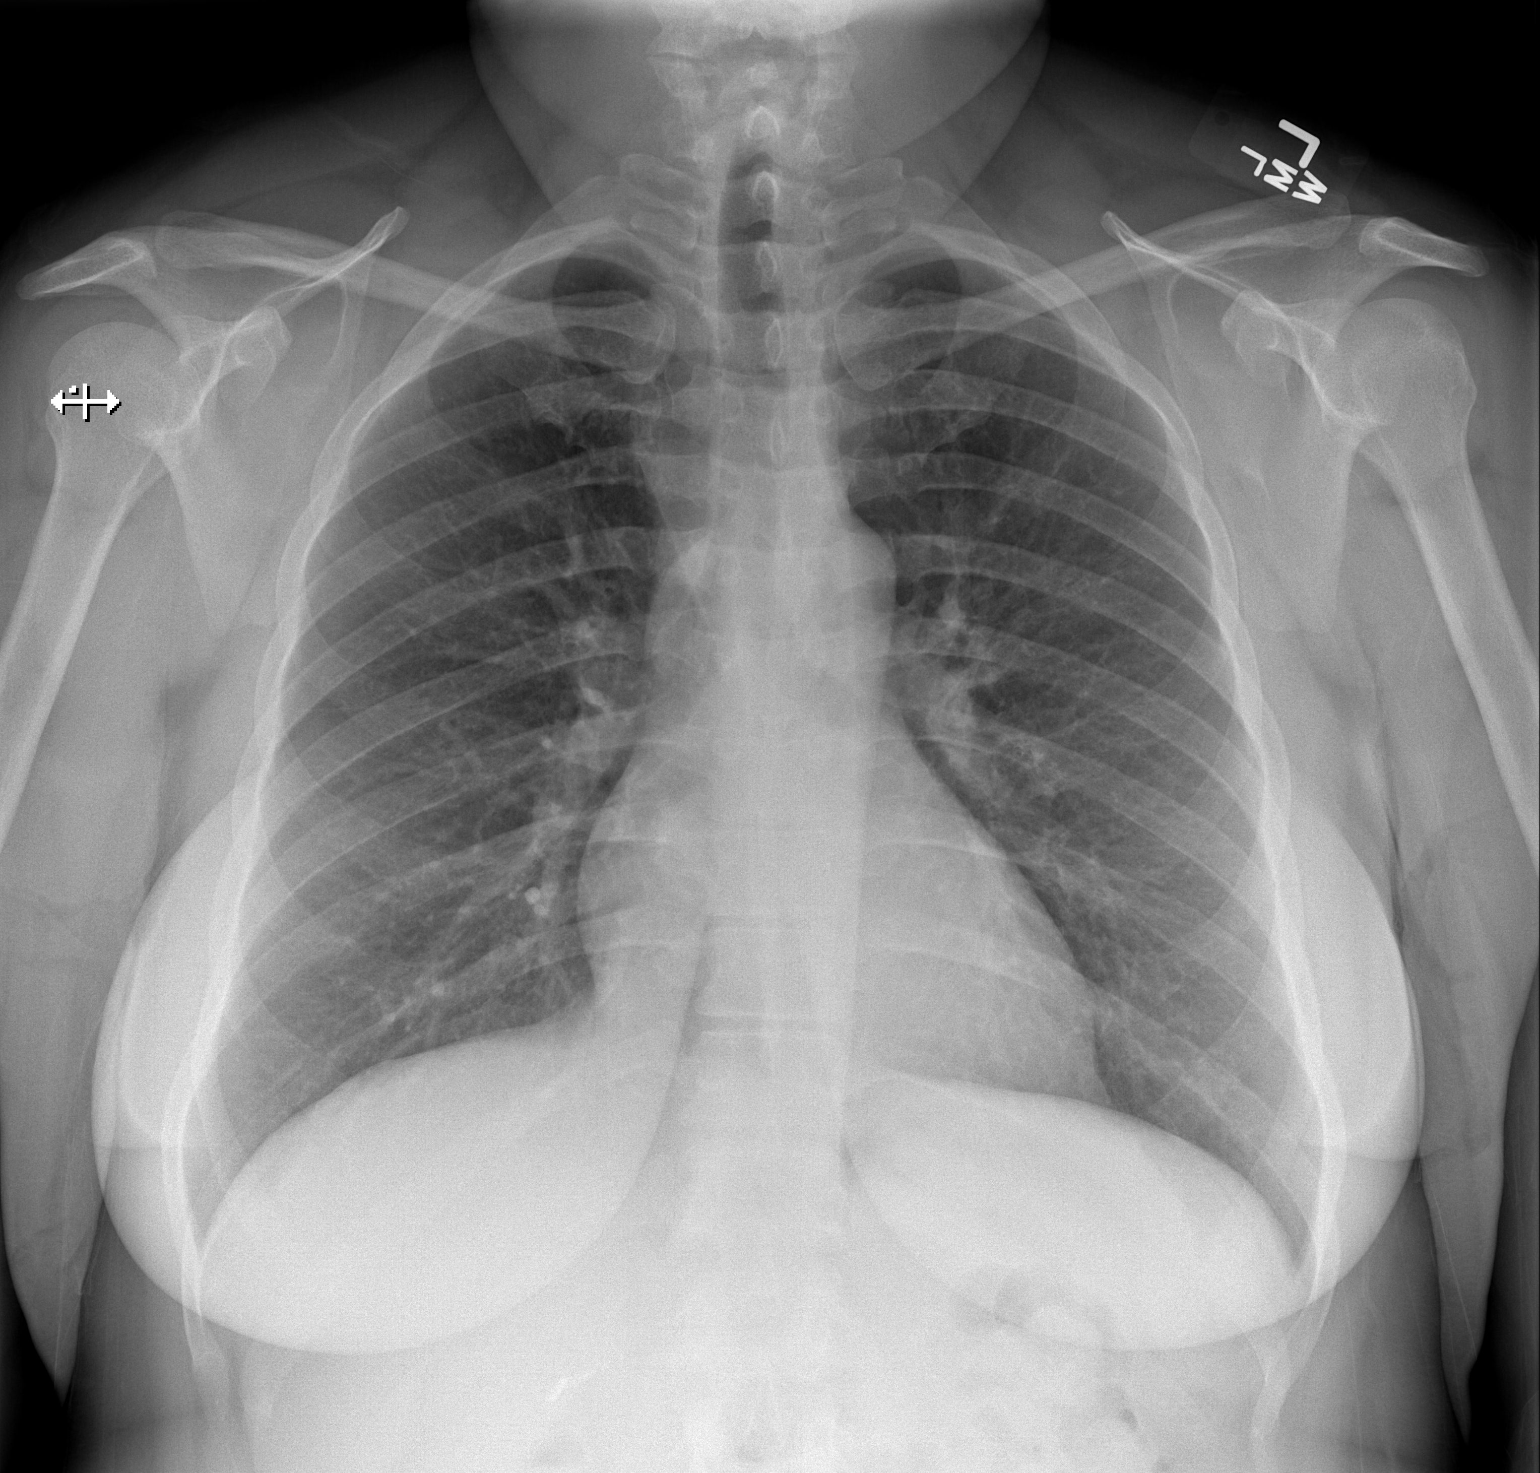
[im 2/2]
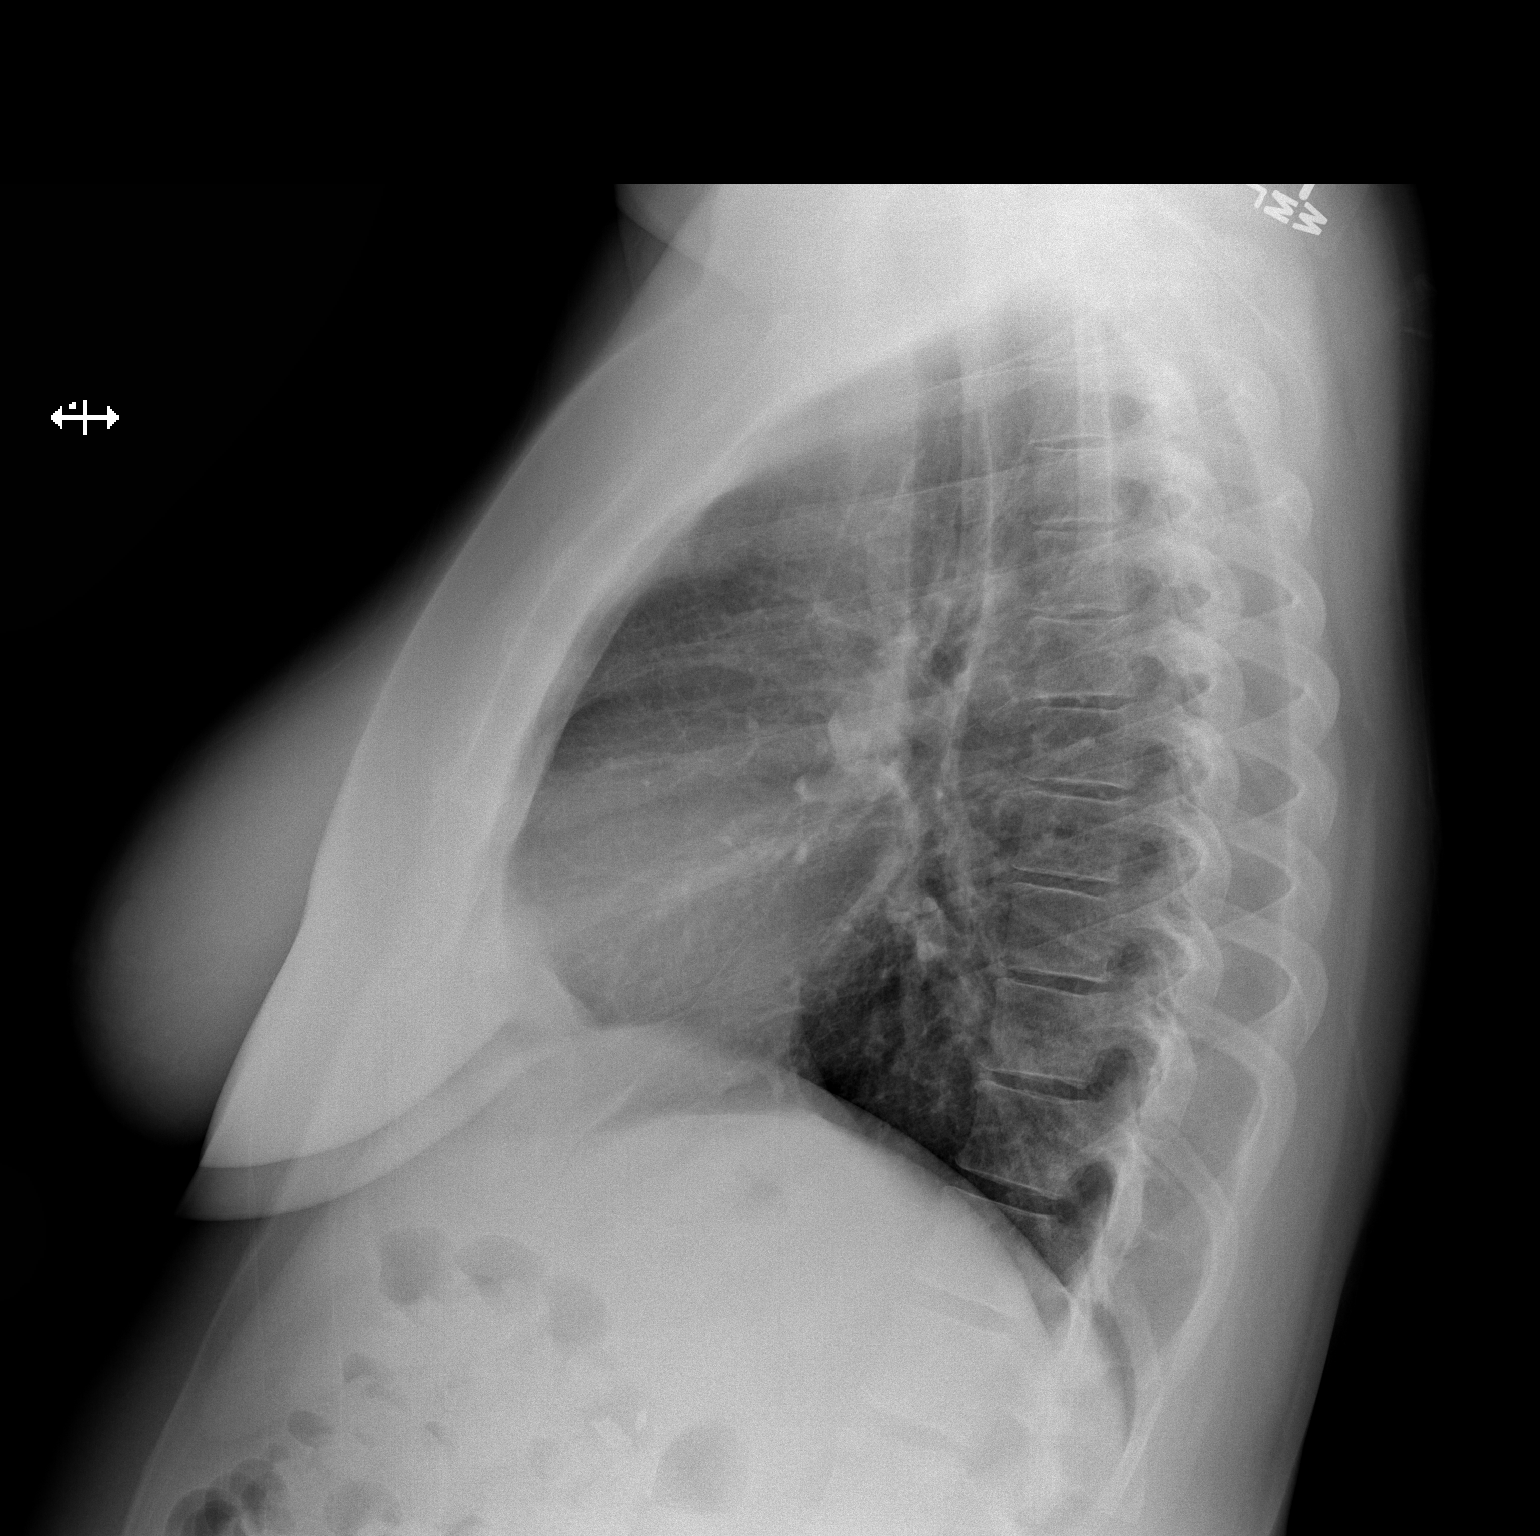

[2 of 2 positions shown; findings below may reference images not displayed]

FINDINGS: Lungs are clear.  No pleural effusion or pneumothorax.

The heart is normal in size.

Visualized osseous structures are within normal limits.

Cholecystectomy clips.
IMPRESSION: Normal chest radiographs.

## 2014-05-07 LAB — TROPONIN I: Troponin-I: <0.02 ng/mL

## 2014-05-07 LAB — TSH: Thyroid Stimulating Horm: 0.888 u[IU]/mL

## 2014-05-20 ENCOUNTER — Ambulatory Visit: Payer: Self-pay | Admitting: Gastroenterology

## 2014-05-24 LAB — PATHOLOGY REPORT

## 2014-08-02 ENCOUNTER — Emergency Department: Payer: Self-pay | Admitting: Emergency Medicine

## 2014-08-02 LAB — COMPREHENSIVE METABOLIC PANEL
Albumin: 3.6 g/dL (ref 3.4–5.0)
Alkaline Phosphatase: 40 U/L — ABNORMAL LOW
Anion Gap: 9 (ref 7–16)
BUN: 10 mg/dL (ref 7–18)
Bilirubin,Total: 0.4 mg/dL (ref 0.2–1.0)
Calcium, Total: 8.6 mg/dL (ref 8.5–10.1)
Chloride: 103 mmol/L (ref 98–107)
Co2: 27 mmol/L (ref 21–32)
Creatinine: 0.85 mg/dL (ref 0.60–1.30)
EGFR (African American): >60
EGFR (Non-African Amer.): >60
Glucose: 113 mg/dL — ABNORMAL HIGH (ref 65–99)
Osmolality: 277 (ref 275–301)
Potassium: 3.9 mmol/L (ref 3.5–5.1)
SGOT(AST): 22 U/L (ref 15–37)
SGPT (ALT): 26 U/L
Sodium: 139 mmol/L (ref 136–145)
Total Protein: 7.3 g/dL (ref 6.4–8.2)

## 2014-08-02 LAB — CBC
HCT: 35.6 % (ref 35.0–47.0)
HGB: 11.8 g/dL — ABNORMAL LOW (ref 12.0–16.0)
MCH: 30 pg (ref 26.0–34.0)
MCHC: 33.2 g/dL (ref 32.0–36.0)
MCV: 90 fL (ref 80–100)
Platelet: 258 10*3/uL (ref 150–440)
RBC: 3.95 10*6/uL (ref 3.80–5.20)
RDW: 13.5 % (ref 11.5–14.5)
WBC: 6 10*3/uL (ref 3.6–11.0)

## 2014-08-02 LAB — TROPONIN I: Troponin-I: <0.02 ng/mL

## 2014-08-27 ENCOUNTER — Emergency Department: Payer: Self-pay | Admitting: Emergency Medicine

## 2014-08-27 IMAGING — CR DG KNEE COMPLETE 4+V*L*
1 series · 4 of 4 positions shown · non-contrast
Comparison: None.

CLINICAL DATA: Initial encounter for left knee pain starting last
night without known injury.

EXAM:
LEFT KNEE - COMPLETE 4+ VIEW

[Series 1: ap · 0.17mm/px · 4 of 4 slices shown]
[im 1/4]
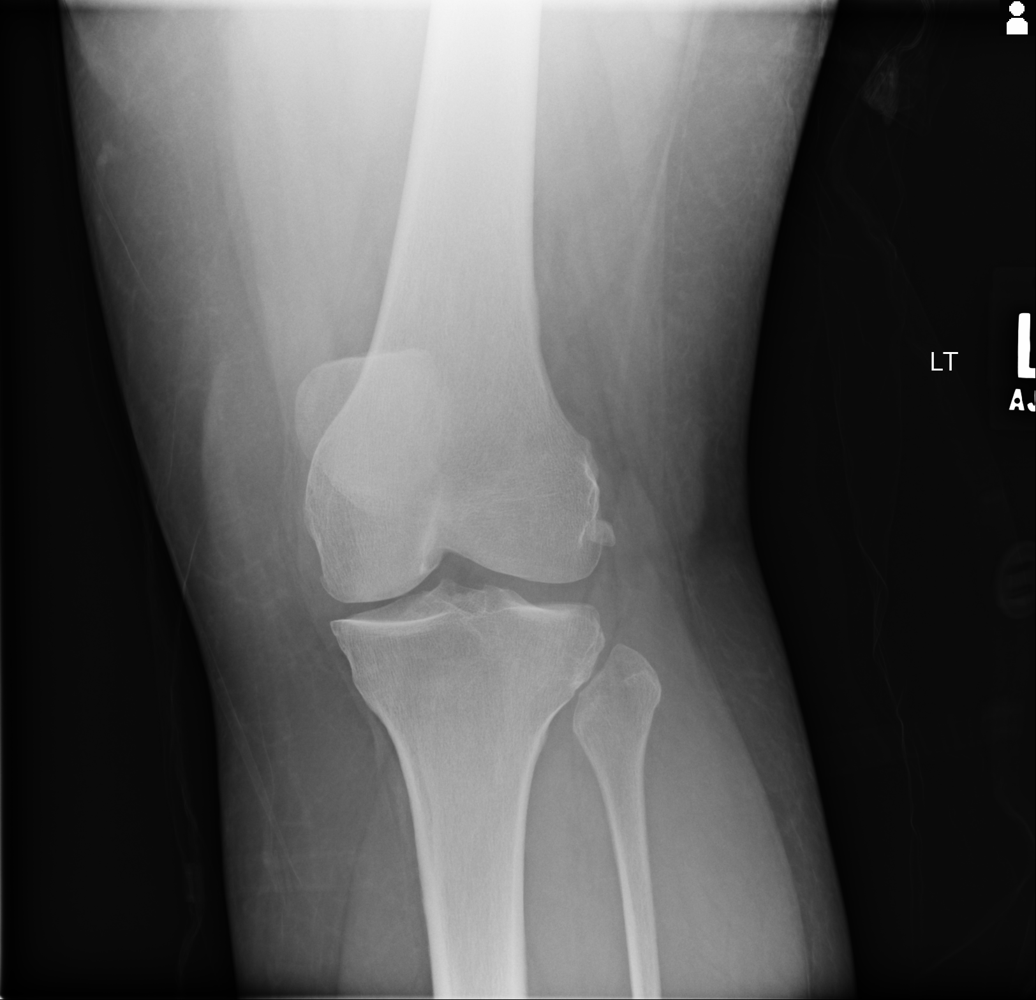
[im 2/4]
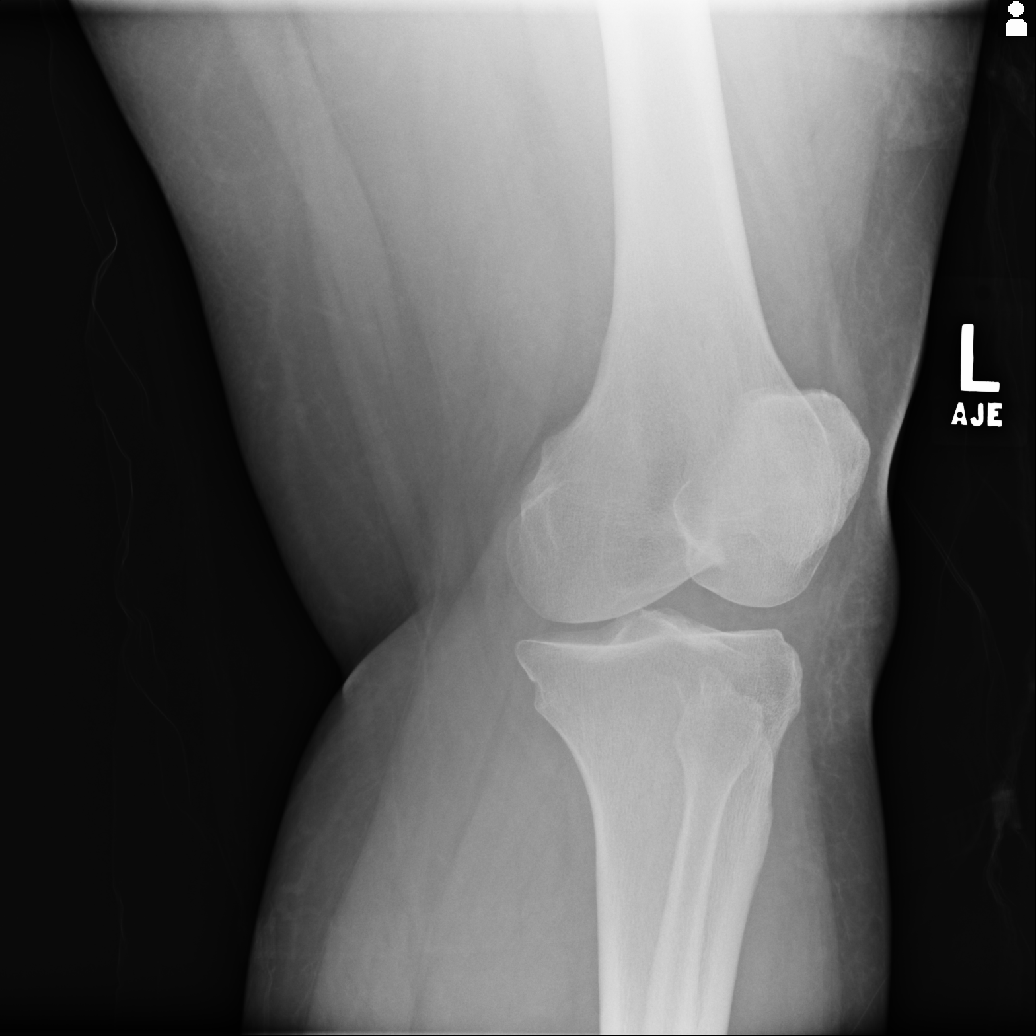
[im 3/4]
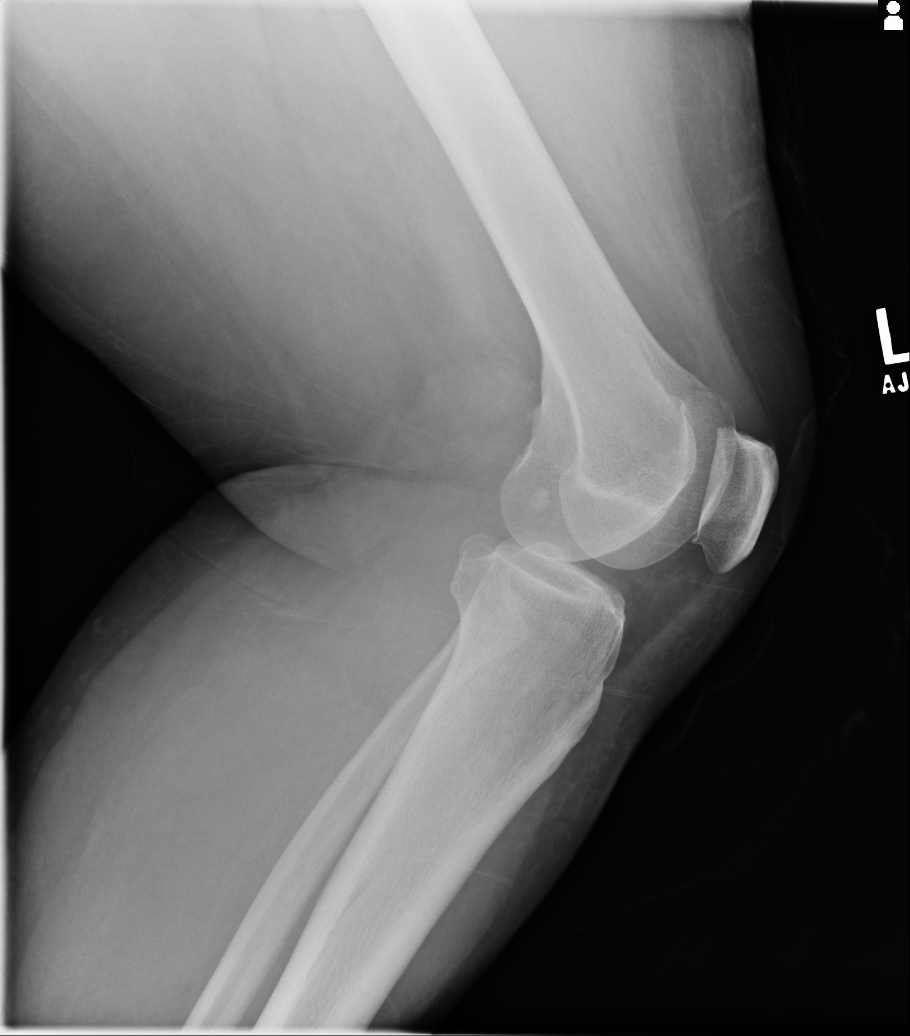
[im 4/4]
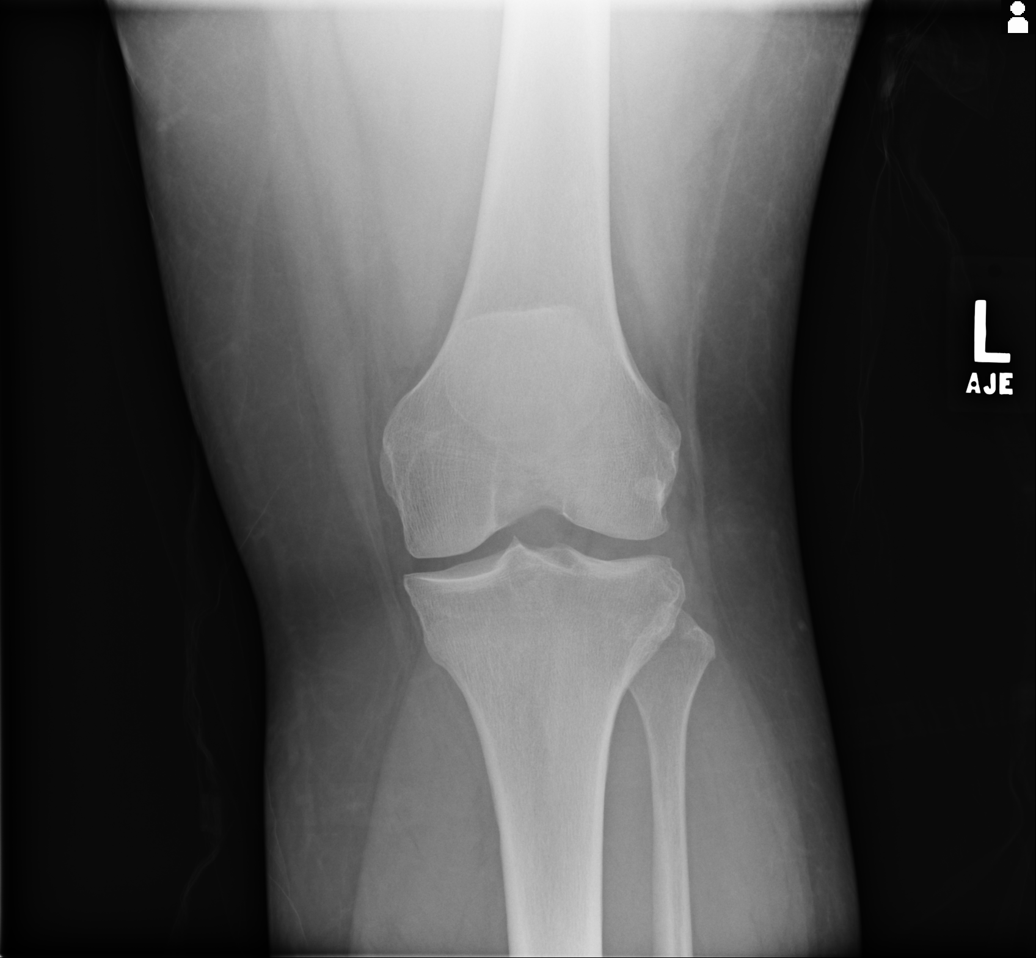

[4 of 4 positions shown; findings below may reference images not displayed]

FINDINGS: No evidence for fracture. No subluxation or dislocation. No joint
effusion. Small hypertrophic spurs are seen in the lateral and
patellofemoral compartments.
IMPRESSION: Mild degenerative changes without acute findings.

## 2014-10-07 ENCOUNTER — Emergency Department: Payer: Self-pay | Admitting: Emergency Medicine

## 2014-10-07 IMAGING — CR DG CHEST 2V
1 series · 3 of 3 positions shown · non-contrast
Comparison: [DATE]

CLINICAL DATA: patient to ED c/o L sided sharp pain to shoulder,
neck and upper back with sudden onset 1 hour pta while standing in
kitchen at home watching her husband PLYBOUY MIEGAL. patient took 3 x 81 mg
ASA at home before presenting to ED.

EXAM:
CHEST  2 VIEW

[Series 1: w chest pa · 0.14mm/px · 3 of 3 slices shown]
[im 1/3]
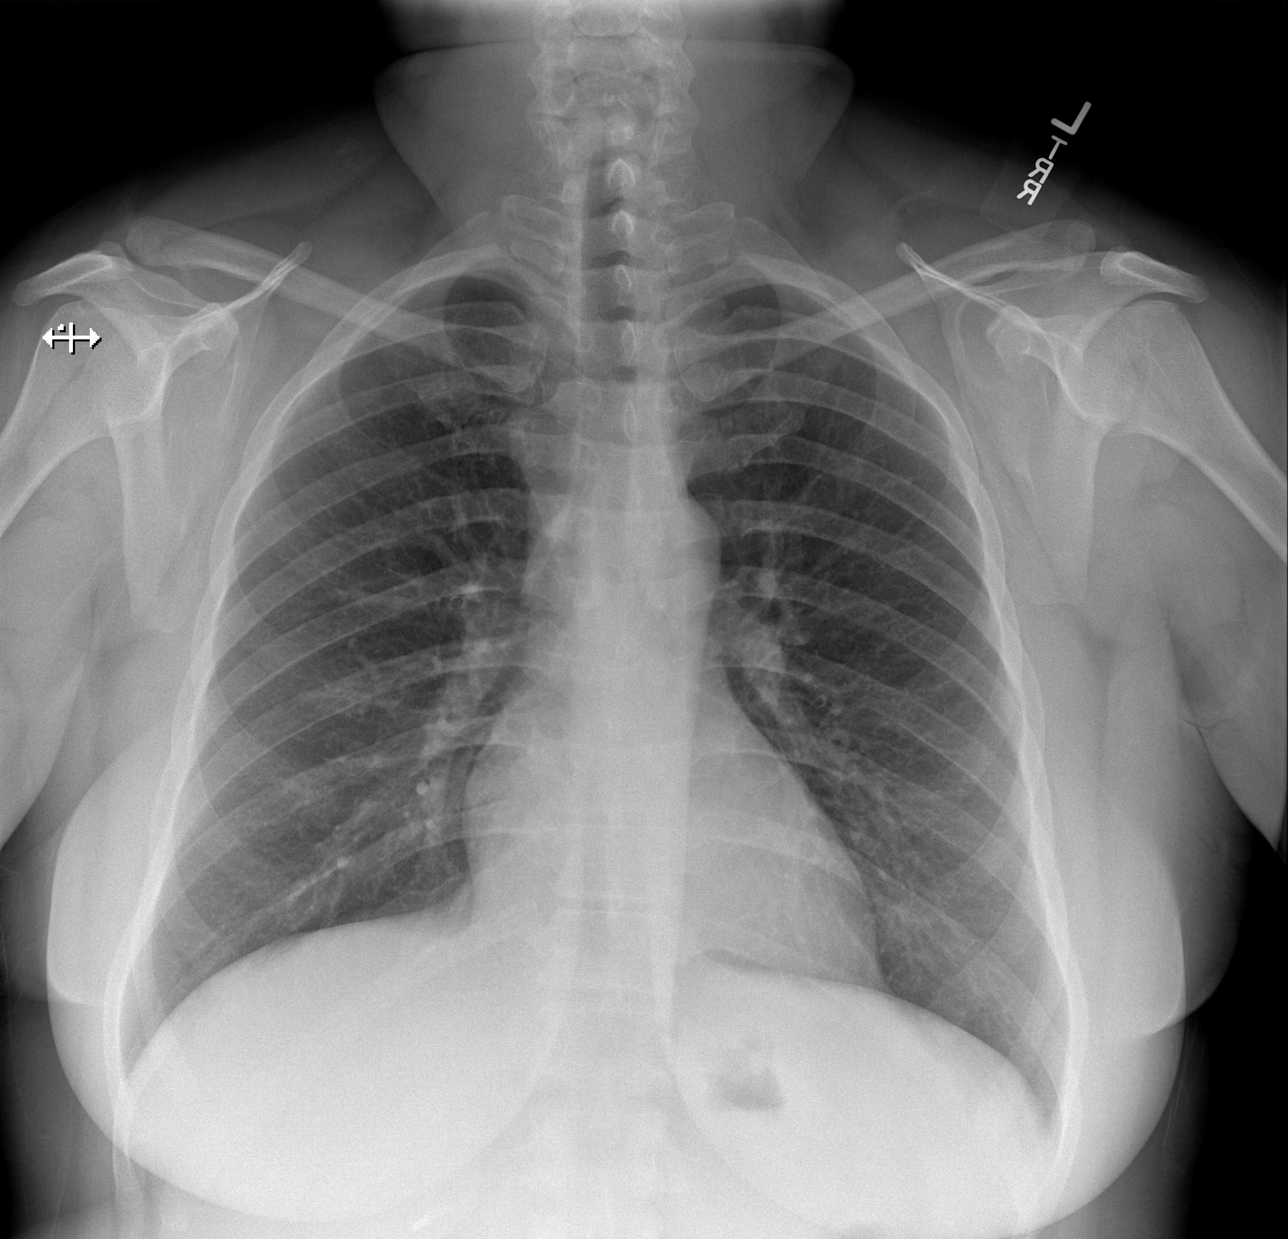
[im 2/3]
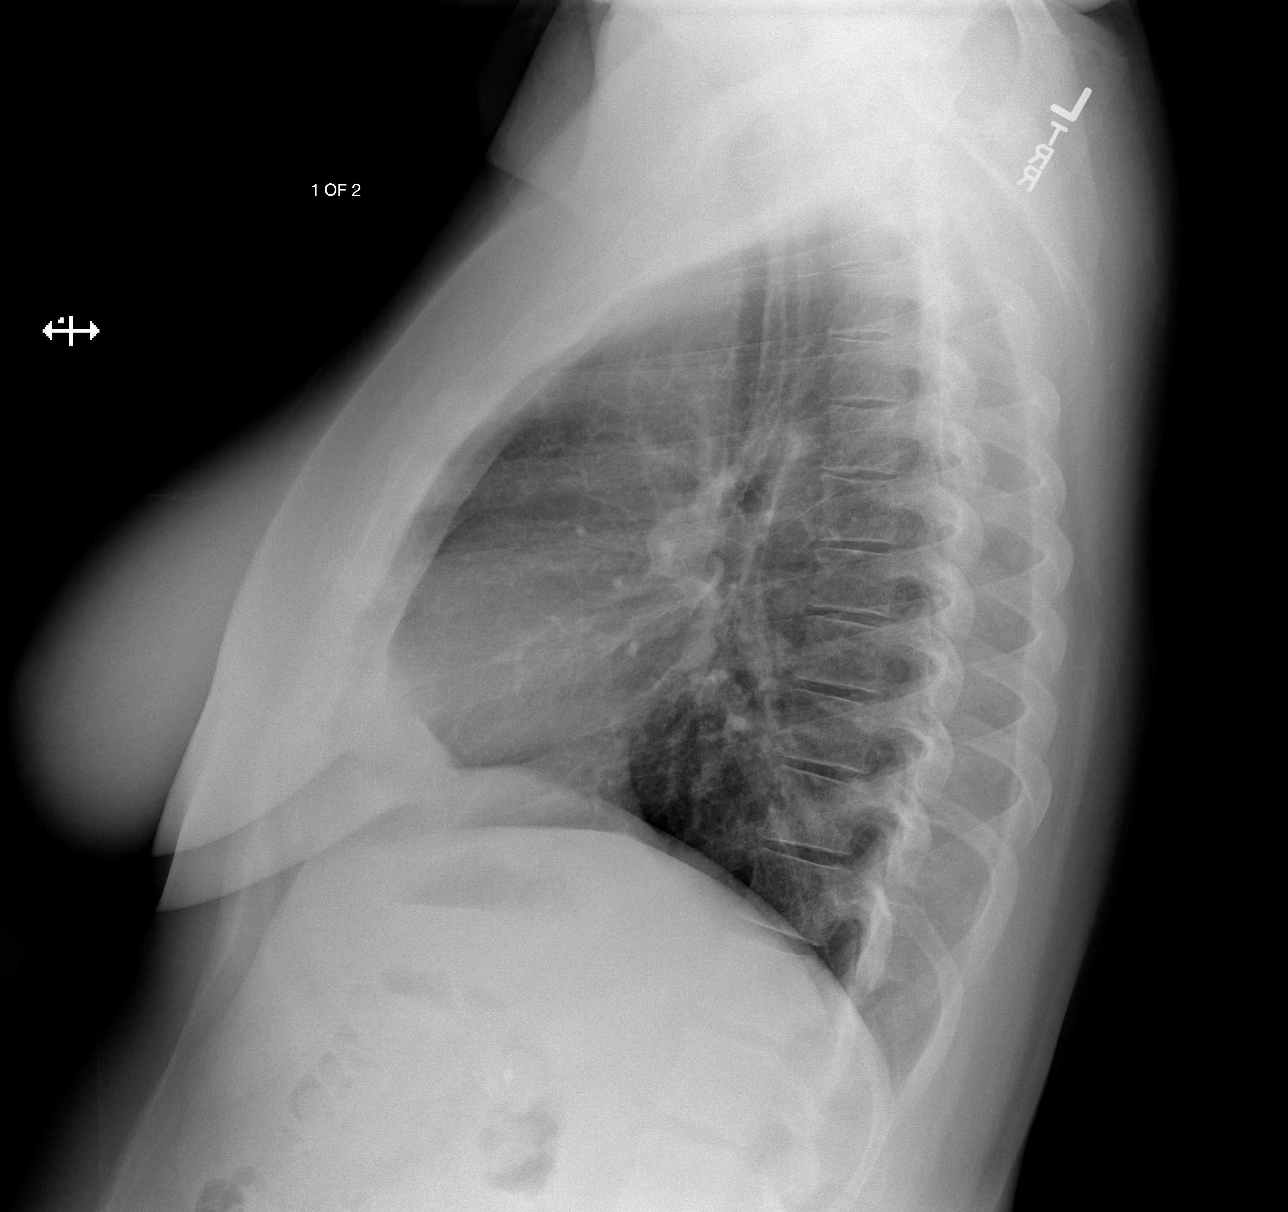
[im 3/3]
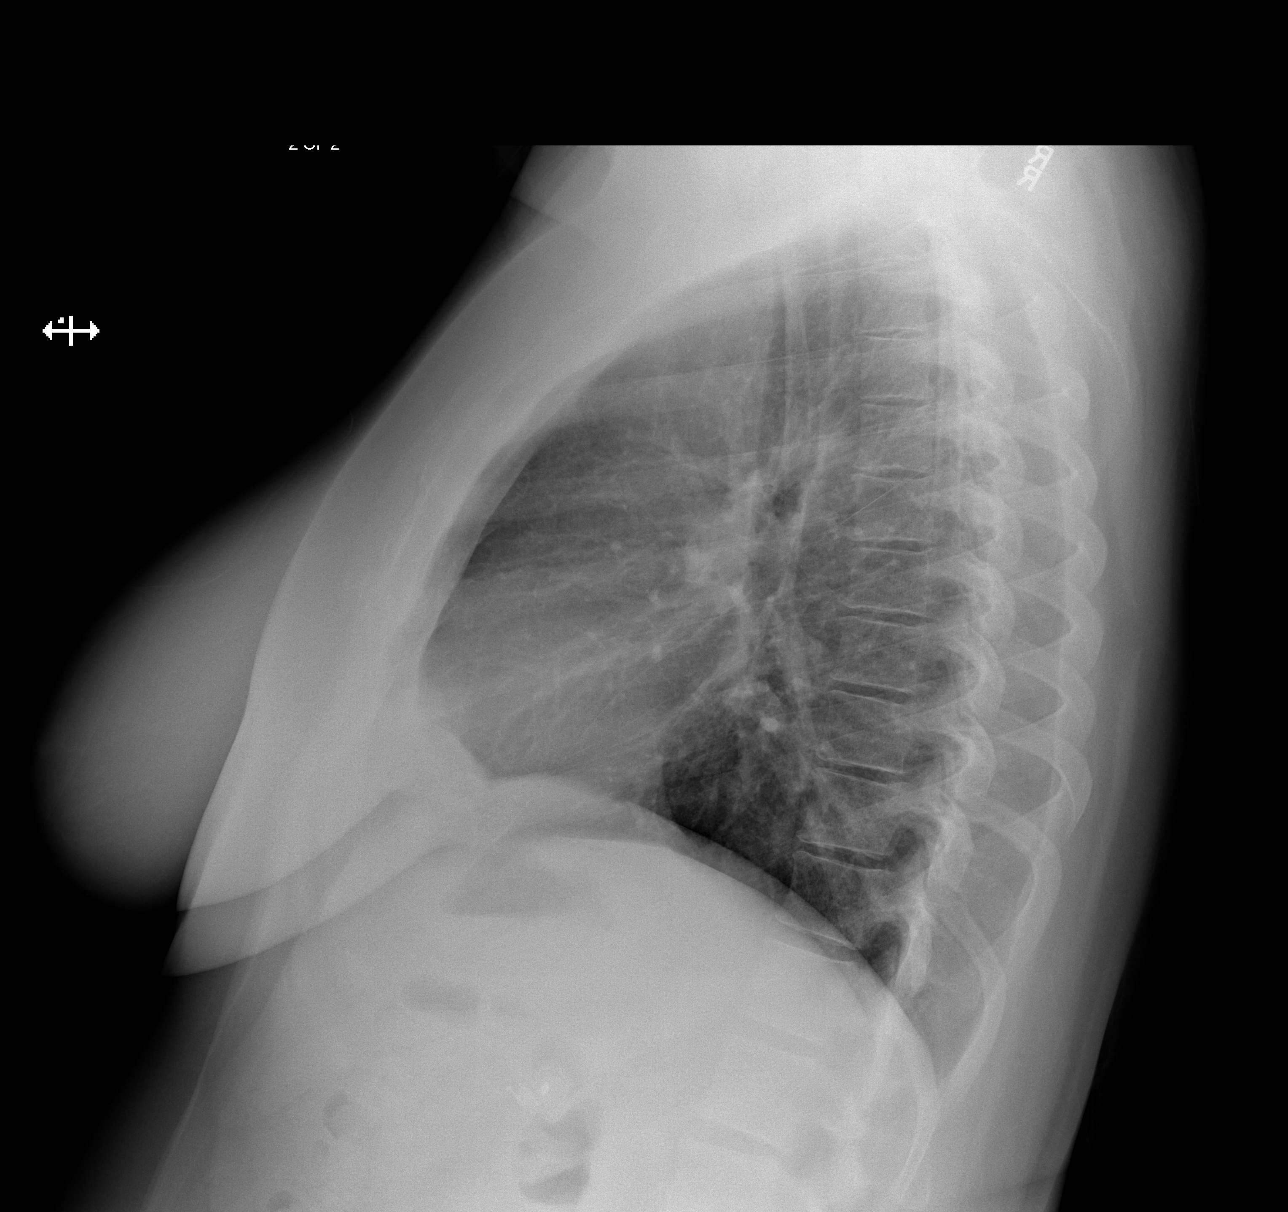

[3 of 3 positions shown; findings below may reference images not displayed]

FINDINGS: The heart size and mediastinal contours are within normal limits.
Both lungs are clear. No pleural effusion or pneumothorax. The
visualized skeletal structures are unremarkable.
IMPRESSION: No active cardiopulmonary disease.

## 2014-10-24 ENCOUNTER — Emergency Department: Payer: Self-pay | Admitting: Emergency Medicine

## 2014-12-17 NOTE — H&P (Signed)
Subjective/Chief Complaint abdominal pain   History of Present Illness Constant severe pain since 8PM last night (7 hours ago). No fever, change in the color of her urine, stool, skin, or eyes. Only previous episode Aug 2013 - told by her PCP it wasn't her GB despite a + U/S then for stones. U/S then had a 4 mm CBD.   Past Med/Surgical Hx:  Denies medical history:   Cesarean Section:   ALLERGIES:  No Known Allergies:   Family and Social History:  Family History Non-Contributory   Social History negative tobacco, negative ETOH   Place of Living Home  married, cares for 7 of her 8 children, doesn't work outside the home, no smoking, no ETOH   Review of Systems:  Fever/Chills No   Cough No   Sputum No   Abdominal Pain Yes   Diarrhea No   Constipation No   Nausea/Vomiting No   SOB/DOE No   Chest Pain No   Dysuria No   Tolerating PT Yes   Tolerating Diet Yes   Medications/Allergies Reviewed Medications/Allergies reviewed   Physical Exam:  GEN well developed, well nourished   HEENT pink conjunctivae, PERRL, hearing intact to voice, moist oral mucosa   NECK supple  No masses   RESP normal resp effort  clear BS   CARD regular rate  no murmur  no Rub   ABD positive tenderness  soft   LYMPH negative neck   EXTR negative cyanosis/clubbing, negative edema   SKIN normal to palpation, No ulcers, skin turgor good   NEURO cranial nerves intact, negative tremor, follows commands, motor/sensory function intact   PSYCH alert, A+O to time, place, person   Lab Results: Hepatic:  06-Mar-14 00:38   Bilirubin, Total 0.4  Alkaline Phosphatase  42  SGPT (ALT) 25  SGOT (AST)  13  Total Protein, Serum 7.4  Albumin, Serum 3.9  Routine Chem:  06-Mar-14 00:38   Glucose, Serum 97  BUN 9  Creatinine (comp) 0.72  Sodium, Serum 140  Potassium, Serum 3.6  Chloride, Serum 106  CO2, Serum 25  Calcium (Total), Serum 8.9  Osmolality (calc) 278  eGFR (African  American) >60  eGFR (Non-African American) >60 (eGFR values <62m/min/1.73 m2 may be an indication of chronic kidney disease (CKD). Calculated eGFR is useful in patients with stable renal function. The eGFR calculation will not be reliable in acutely ill patients when serum creatinine is changing rapidly. It is not useful in  patients on dialysis. The eGFR calculation may not be applicable to patients at the low and high extremes of body sizes, pregnant women, and vegetarians.)  Anion Gap 9  Cardiac:  06-Mar-14 00:38   Troponin I < 0.02 (0.00-0.05 0.05 ng/mL or less: NEGATIVE  Repeat testing in 3-6 hrs  if clinically indicated. >0.05 ng/mL: POTENTIAL  MYOCARDIAL INJURY. Repeat  testing in 3-6 hrs if  clinically indicated. NOTE: An increase or decrease  of 30% or more on serial  testing suggests a  clinically important change)  Routine Hem:  06-Mar-14 00:38   WBC (CBC) 8.3  RBC (CBC) 4.09  Hemoglobin (CBC) 12.4  Hematocrit (CBC) 36.1  Platelet Count (CBC) 266 (Result(s) reported on 30 Oct 2012 at 01:05AM.)  MCV 88  MCH 30.3  MCHC 34.3  RDW 13.1   Radiology Results: [place]    21-Aug-13 04:01, [place]  [place]  REASON FOR EXAM:    RUQ pain, nausea  COMMENTS:   Body Site: GB  and Fossa, CBD, Head of Pancreas    PROCEDURE: [place]  - [place] ABDOMEN LIMITED SURVEY  - Apr 16 2012  4:01AM     RESULT: Prominent nonmobile stone noted in the gallbladder. Gallbladder   wall thickness 2.9 mm. Negative Murphy's sign. Common bile duct diameter   4.0 mm. Pancreas is nonvisualized.    IMPRESSION:  Cholelithiasis.          Verified By: Osa Craver, M.D., MD    Assessment/Admission Diagnosis Sx cholelithiasis vs early hydrops   Plan Lap CCY   Electronic Signatures: Consuela Mimes (MD)  (Signed 06-Mar-14 04:08)  Authored: CHIEF COMPLAINT and HISTORY, PAST MEDICAL/SURGIAL HISTORY, ALLERGIES, FAMILY AND SOCIAL HISTORY, REVIEW OF SYSTEMS,  PHYSICAL EXAM, LABS, Radiology, ASSESSMENT AND PLAN   Last Updated: 06-Mar-14 04:08 by Consuela Mimes (MD)

## 2014-12-17 NOTE — Op Note (Signed)
  DATE OF BIRTH:  1966/05/02  DATE OF PROCEDURE:  10/30/2012  ATTENDING PHYSICIAN:  Dr. Ida Roguehristopher Lundquist  PREOPERATIVE DIAGNOSIS:  Acute cholecystitis.   POSTOPERATIVE DIAGNOSIS:  Symptomatic cholelithiasis.   ANESTHESIA:  General.   ESTIMATED BLOOD LOSS:  Minimal.   SPECIMEN:  Gallbladder.   INDICATION FOR SURGERY:  The patient is a 49 year old female with a history of recurrent right upper quadrant and epigastric pain, and is previously known to have gallstones. She presents with a similar episode today, and her pain is biliary in nature. We elected for laparoscopic cholecystectomy.   DETAILS OF PROCEDURE:  The patient was brought to the Operating Room suite. She was laid supine on the Operating Room table. She was induced. Endotracheal tube was placed. General anesthesia was administered. Her abdomen was prepped and draped in standard surgical fashion. A timeout was then performed, correctly identifying patient name, operative site and procedure to be performed. A supraumbilical incision was made, and this was deepened down to fascia. The fascia was grasped, fascia incised. The peritoneum was entered. Two stay sutures were placed through the peritoneum. A Hassan trocar was placed in the abdomen. The abdomen was insufflated. A 3-degree 10 mm was placed through the abdomen. The gallbladder was visualized. It appeared to be not horribly inflamed. An 11 mm epigastric port and two 5 mm ports were placed at the midclavicular and anterior axillary line. The fundus of the gallbladder was retracted over the dome of the liver. The infundibulum was retracted laterally. The cystic artery and cystic duct were dissected out. There was a small, thin structure which also looked like the cystic artery. Critical view was obtained. All 3 of these  structures were clipped. The gallbladder was then taken off the gallbladder fossa with electrocautery. The trocar was then taken out through a supraumbilical  incision. The gallbladder fossa was irrigated and made hemostatic. The abdomen was irrigated with approximately 1 liter of fluid. After the fossa was ensured to be hemostatic, all trocars were taken out. The supraumbilical trocar was closed with a 0 Vicryl figure-of-eight. The skin was then closed on all 3 skin sites using an interrupted 4-0 Monocryl. Sterile dressings, Steri-Strips, Telfa gauze and Tegaderm were used to complete the dressing. The patient was then awoken and brought to the postanesthesia care unit. There were no immediate complications. Needle, sponge and instrument count was correct at the end of the procedure.    ____________________________ Si Raiderhristopher A. Lundquist, MD cal:mr D: 10/30/2012 14:59:58 ET T: 10/30/2012 20:06:19 ET JOB#: [id number]  cc: Cristal Deerhristopher A. Lundquist, MD, <Dictator> Jarvis NewcomerHRISTOPHER A LUNDQUIST MD ELECTRONICALLY SIGNED 11/01/2012 11:13

## 2014-12-17 NOTE — Discharge Summary (Signed)
PATIENT NAME:  Morgan Stevens, Morgan Stevens MR#:  [id number] DATE OF BIRTH:  06/26/1966  DATE OF ADMISSION:  05/08/2013 DATE OF DISCHARGE:  05/08/2013  FINAL DIAGNOSIS:  Recurrent chest pain.  Exercise stress test negative.  Troponin on telemetry negative.   CONDITION ON DISCHARGE:  Stable.   CODE STATUS:  FULL CODE.   MEDICATIONS ON DISCHARGE:  None.   DIET ON DISCHARGE:  Regular.   ACTIVITY LIMITATION:  As tolerated.   TIMEFRAME TO FOLLOW-UP:  Within 4 to 6 weeks.  Routine follow-up with primary care physician.   HISTORY OF PRESENT ILLNESS:  A 49 year old female without significant past medical history presented with complaint of chest pain.  The patient had 6 times in the last one year presentation with similar chest pain complaint presenting to ER, so the last time after discharge she was supposed to follow with cardiology clinic as outpatient.  She followed to Dr. Gwen PoundsKowalski and she was scheduled to have exercise treadmill stress test, but she did not follow due to financial reasons, now presenting with chest pain again, midsternal, radiating to left arm, described it as dull quality, no nausea or vomiting.  Troponins were negative and there are no ST-T changes, had coronary artery disease in the family at a younger age.  The patient was admitted under observation for further work-up and possible stress test.  She remained pain-free.  Troponins remained negative and there were no events on telemetry.  Exercise stress test was done by Dr. Juliann Paresallwood and as per his discussion with me the stress test was negative without any significant finding of ischemia and the patient can be successfully and satisfactorily discharged home as per her cardiologist, so I decided to discharge the patient home.   IMPORTANT LABORATORY RESULTS IN THE HOSPITAL:  Creatinine was 0.65, potassium was 3.7, white cell count was 9.2, hemoglobin 13, troponin less than 0.02, all three of them.  D-dimer was 0.34.  Chest x-ray showed no evidence  of acute cardiopulmonary abnormality.   Total time spent in this discharge 40 minutes.    ____________________________ Hope PigeonVaibhavkumar G. Elisabeth PigeonVachhani, MD vgv:ea D: 05/12/2013 23:02:52 ET T: 05/13/2013 00:10:27 ET JOB#: [id number]  cc: Hope PigeonVaibhavkumar G. Elisabeth PigeonVachhani, MD, <Dictator> Altamese DillingVAIBHAVKUMAR Eagle Pitta MD ELECTRONICALLY SIGNED 05/16/2013 15:02

## 2014-12-17 NOTE — H&P (Signed)
PATIENT NAME:  Morgan Stevens, [name] MR#:  [id number] DATE OF BIRTH:  February 08, 1966  DATE OF ADMISSION:  05/08/2013  REFERRING PHYSICIAN:  Dr. Bayard Malesandolph Brown.  PRIMARY CARE PHYSICIAN:  Dr. Bari EdwardLaura Berglund.  PRIMARY CARDIOLOGIST:  Saw Dr. Gwen PoundsKowalski x 1 in his office.  CHIEF COMPLAINT:  Chest pain.   HISTORY OF PRESENT ILLNESS:  This is a 49 year old female without significant past medical history presents with complaints of chest pain for one day, the patient already presented six times this year for similar complaints of chest pain to ED, was instructed to follow with cardiology as an outpatient, was seen  by Dr. Gwen PoundsKowalski, was supposed to have a treadmill EKG done, but she did not secondary to financial reasons.  This was in June of this year, the patient reports this episode of chest pain resembled her previous episodes, is reported as midsternal, radiating to the left arm, described it as dull quality, denies any nausea, vomiting, sweating or shortness of breath or hemoptysis or palpitations.  No productive, no sputum, no weakness, no dizziness, as well, the patient's EKG was normal, no ST or T wave changes.  The patient had negative troponins x 2, the patient denies any history of smoking, but reports history of coronary artery disease in her family at a young age, the patient reports she took four baby aspirin at her home, chest pain resolved spontaneous without any intervention.  Denies any relieving or provoking factors, given the fact this is patient's sixth time she presents to ED with similar complaints, hospitalist service were requested to admit the patient for further work-up of her chest pain.   PAST MEDICAL HISTORY:  1.  Cholecystectomy.  2.  Hiatal hernia.  3.  Minimal hyperlipidemia.   FAMILY HISTORY:  Significant for mitral valve replacement and heart failure in her mother.  Sister having lung cancer.   SOCIAL HISTORY:  No history of alcohol or tobacco use.   ALLERGIES:  No known drug  allergies.   HOME MEDICATIONS:  Currently taking none.   REVIEW OF SYSTEMS: CONSTITUTIONAL:  Denies fever, chills, weakness, weight gain, weight loss.  EYES:  Denies blurry vision, double vision, inflammation, glaucoma.  EARS, NOSE, THROAT:  Denies tinnitus, ear pain, hearing loss, epistaxis.  RESPIRATORY:  Denies cough, wheezing, COPD or dyspnea.  CARDIOVASCULAR:  Complains of chest pain, resolved.  Denies edema, arrhythmia, palpitations, syncope.  GASTROINTESTINAL:  Denies nausea, vomiting, diarrhea, abdominal pain, hematemesis, jaundice.  GENITOURINARY:  Denies dysuria, hematuria, renal colic.  ENDOCRINE:  Denies polyuria, polydipsia, heat or cold intolerance.  HEMATOLOGY:  Denies anemia, easy bruising, bleeding diathesis.  INTEGUMENTARY:  Denies acne, rash or skin lesions.  MUSCULOSKELETAL:  Denies gout, arthritis or cramps.  NEUROLOGIC:  Denies CVA, TIA, ataxia, vertigo, tremors.  PSYCHIATRIC:  Denies insomnia, bipolar disorder or schizophrenia.  Reports anxiety.   PHYSICAL EXAMINATION: VITAL SIGNS:  Temperature 98.4, pulse 50, respiratory rate 16, blood pressure 114/58, saturating 99% on room air.  GENERAL:  Well-nourished female looks comfortable in bed in no apparent distress.  HEENT:  Head atraumatic, normocephalic.  Pupils equal, reactive to light.  Pink conjunctivae.  Anicteric sclerae.  Moist oral mucosa.  NECK:  Supple.  No thyromegaly.  No JVD.  CHEST:  Good air entry bilaterally.  No wheezing, rales, rhonchi.  No reproducible chest pain on palpitation.  ABDOMEN:  Soft, nontender, nondistended.  Bowel sounds present.  CARDIOVASCULAR:  S1, S2 heard.  No rubs, murmur or gallops.  EXTREMITIES:  No edema.  No clubbing.  No cyanosis.  Dorsalis pedis pulses +2 bilaterally.  SKIN:  Normal skin turgor.  Warm and dry.  LYMPHATICS:  No cervical, supraclavicular lymphadenopathy.  NEUROLOGIC:  Cranial nerves grossly intact.  Motor 5 out of 5.   PSYCHIATRIC:  Appropriate affect.   Awake, alert x 3.  Intact judgment and insight.   PERTINENT LABORATORY DATA:  Glucose 122, BUN 12, creatinine 0.65, sodium 138, potassium 3.7, chloride 106, CO2 23.  Troponin less than 0.02 x 2.  White blood cells 9.2, hemoglobin 13, hematocrit 37.3, platelets 263.  D-dimer 0.34.  EKG showing normal sinus rhythm without ST or T wave abnormalities at 75 beats per minute.   IMPRESSION:  1.  Chest pain, the patient has negative troponin x 2, has normal EKG, already took 324 of aspirin at ED, but given the fact her extreme anxiety as well her multiple visits for same complaints, she will be admitted for further work-up.  She will be admitted to telemetry unit.  We will cycle her cardiac enzymes and follow the trend.  We will have her on sublingual nitro as needed.  The patient will have treadmill EKG done as an inpatient to rule out any cardiac origin of her chest pain.  2.  DVT prophylaxis.  SubQ heparin.  3.  CODE STATUS:  FULL CODE.   Total time spent on admission and patient care 40 minutes.    ____________________________ Starleen Arms, MD dse:ea D: 05/08/2013 03:50:18 ET T: 05/08/2013 04:21:47 ET JOB#: [id number]  cc: Starleen Arms, MD, <Dictator> Fred Hammes Teena Irani MD ELECTRONICALLY SIGNED 05/09/2013 23:26

## 2015-05-25 ENCOUNTER — Emergency Department: Payer: Self-pay

## 2015-05-25 ENCOUNTER — Encounter: Payer: Self-pay | Admitting: Emergency Medicine

## 2015-05-25 ENCOUNTER — Emergency Department
Admission: EM | Admit: 2015-05-25 | Discharge: 2015-05-25 | Disposition: A | Discharge status: Home / Self Care | Payer: Self-pay | Attending: Emergency Medicine | Admitting: Emergency Medicine

## 2015-05-25 DIAGNOSIS — M25512 Pain in left shoulder: Secondary | ICD-10-CM | POA: Insufficient documentation

## 2015-05-25 DIAGNOSIS — M542 Cervicalgia: Secondary | ICD-10-CM | POA: Insufficient documentation

## 2015-05-25 DIAGNOSIS — R079 Chest pain, unspecified: Secondary | ICD-10-CM | POA: Insufficient documentation

## 2015-05-25 HISTORY — DX: Disorder of thyroid, unspecified: E07.9

## 2015-05-25 HISTORY — DX: Reserved for inherently not codable concepts without codable children: IMO0001

## 2015-05-25 HISTORY — DX: Gastro-esophageal reflux disease without esophagitis: K21.9

## 2015-05-25 LAB — CBC
HCT: 39.3 % (ref 35.0–47.0)
Hemoglobin: 13.4 g/dL (ref 12.0–16.0)
MCH: 30.1 pg (ref 26.0–34.0)
MCHC: 34.2 g/dL (ref 32.0–36.0)
MCV: 88 fL (ref 80.0–100.0)
Platelets: 302 10*3/uL (ref 150–440)
RBC: 4.46 MIL/uL (ref 3.80–5.20)
RDW: 13.3 % (ref 11.5–14.5)
WBC: 6.9 10*3/uL (ref 3.6–11.0)

## 2015-05-25 LAB — BASIC METABOLIC PANEL
Anion gap: 6 (ref 5–15)
BUN: 10 mg/dL (ref 6–20)
CO2: 27 mmol/L (ref 22–32)
Calcium: 9.2 mg/dL (ref 8.9–10.3)
Chloride: 107 mmol/L (ref 101–111)
Creatinine, Ser: 0.81 mg/dL (ref 0.44–1.00)
GFR calc Af Amer: >60 mL/min (ref >60)
GFR calc non Af Amer: >60 mL/min (ref >60)
Glucose, Bld: 85 mg/dL (ref 65–99)
Potassium: 3.9 mmol/L (ref 3.5–5.1)
Sodium: 140 mmol/L (ref 135–145)

## 2015-05-25 LAB — TROPONIN I: Troponin I: <0.03 ng/mL (ref <0.031)

## 2015-05-25 IMAGING — CR DG CHEST 2V
1 series · 2 of 2 positions shown · non-contrast
Comparison: None.

CLINICAL DATA: Chest pain radiating to neck and jaw beginning
today.

EXAM:
CHEST  2 VIEW

[Series 1: dg chest 2 view · 0.14mm/px · 2 of 2 slices shown]
[im 1/2]
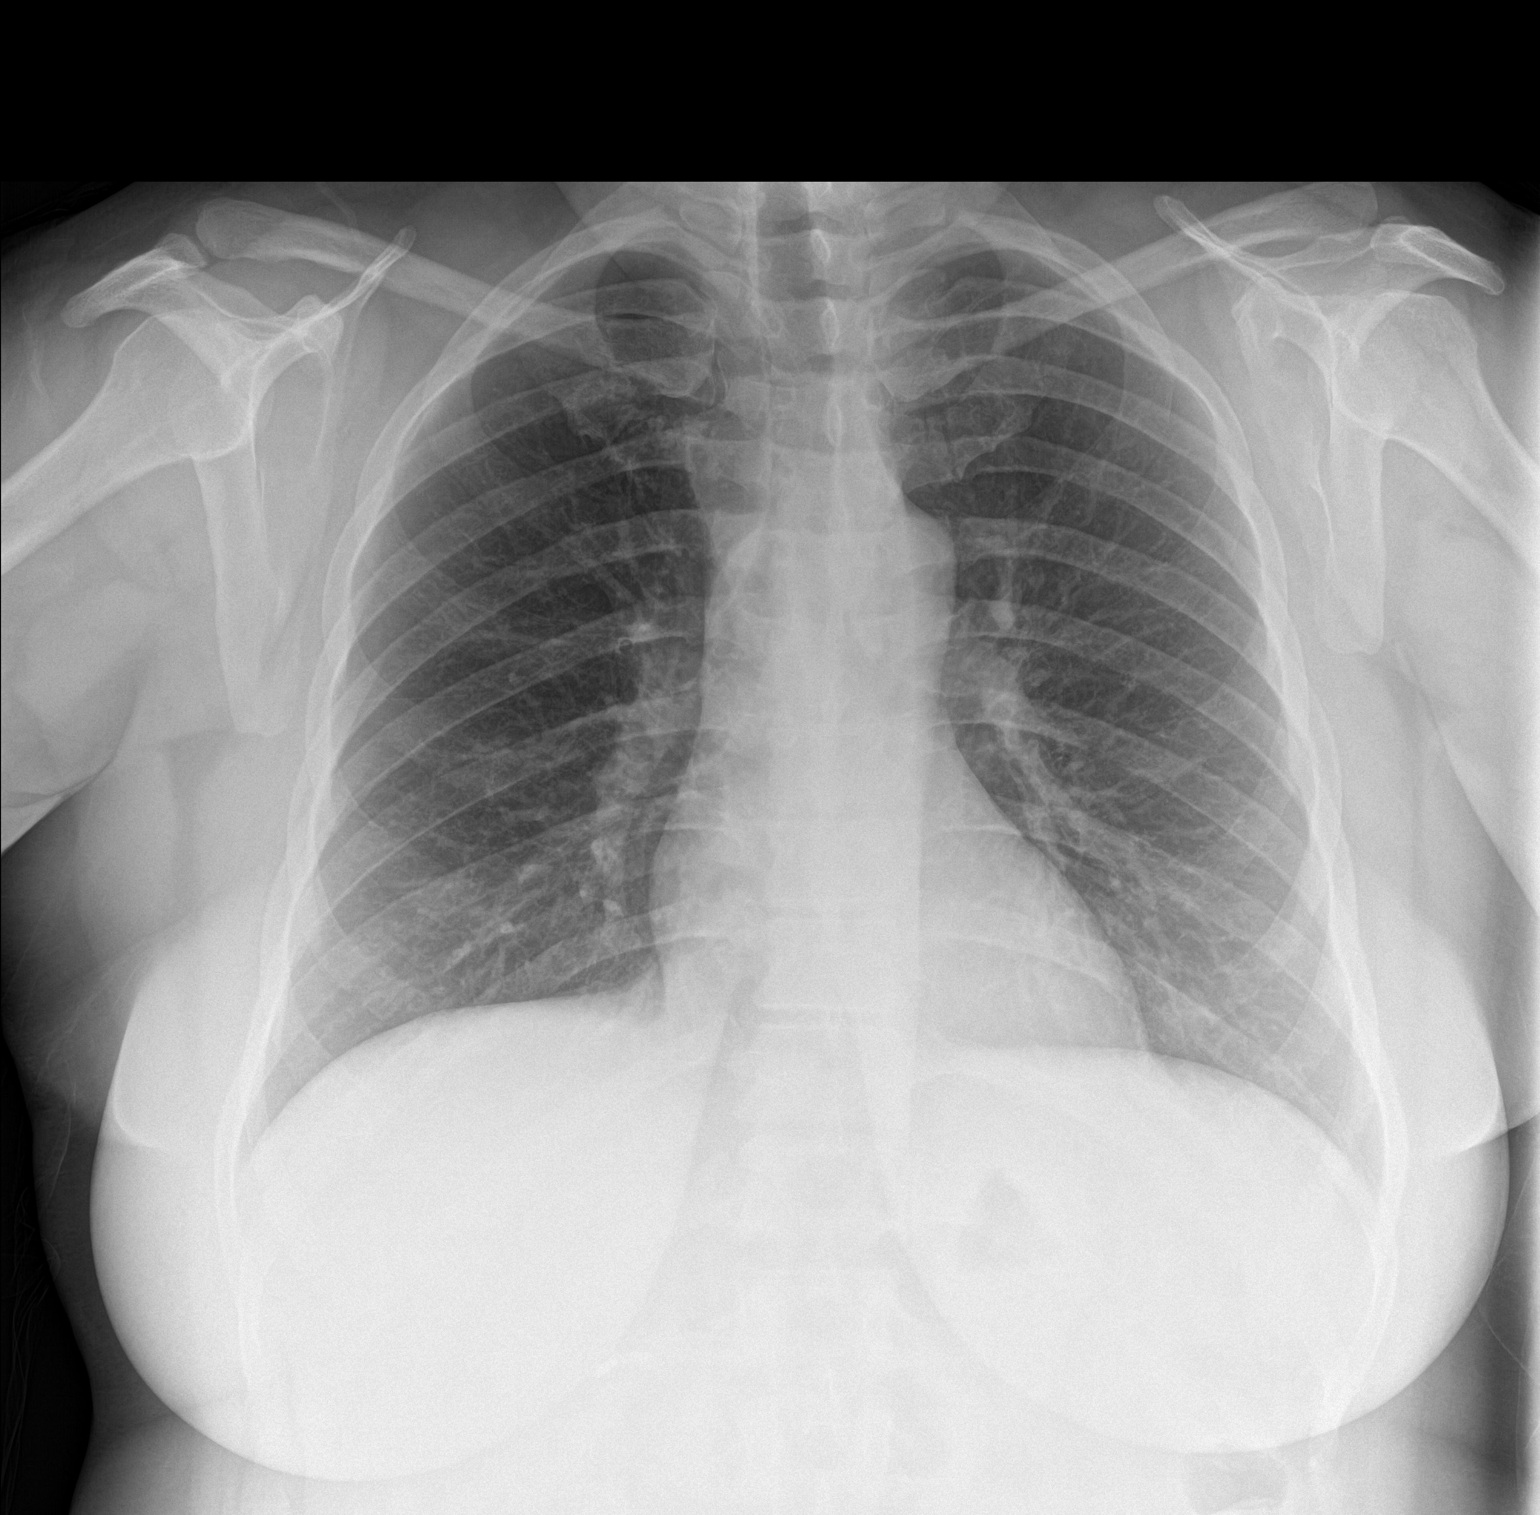
[im 2/2]
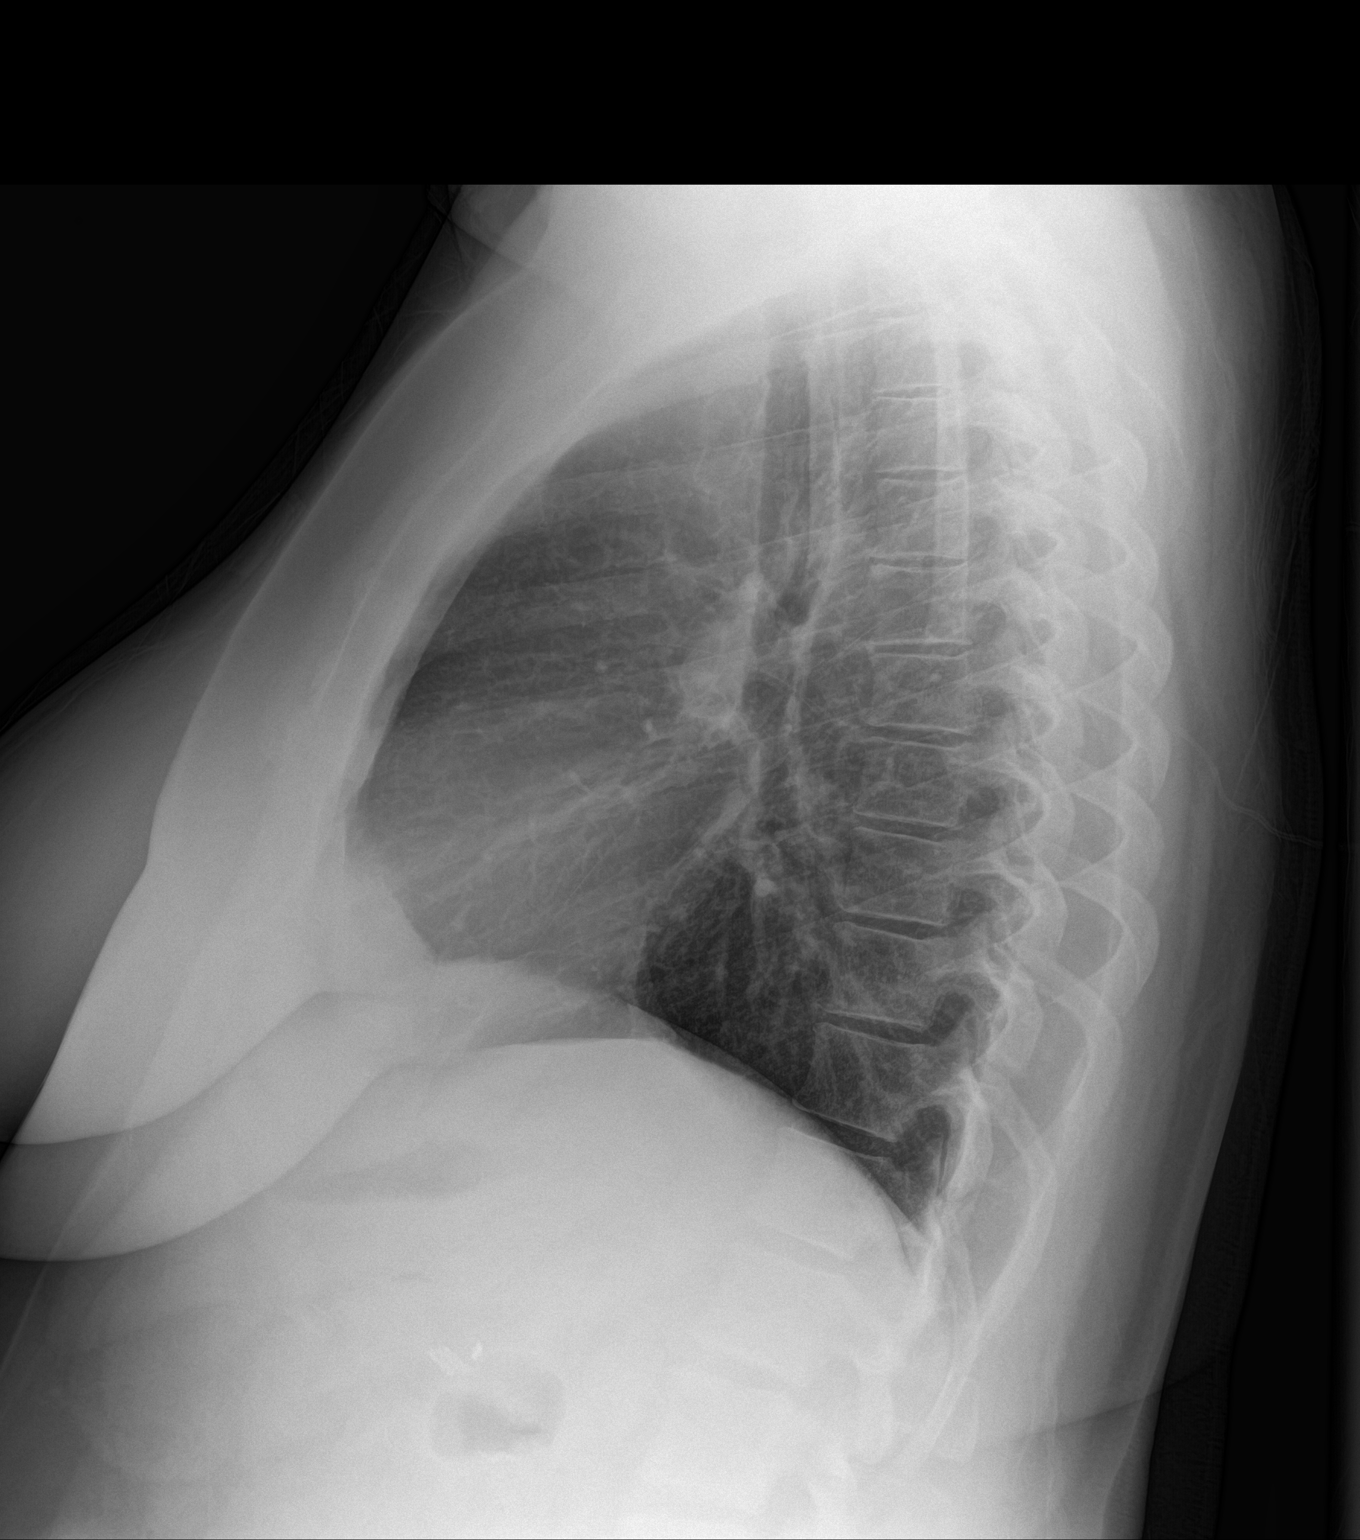

[2 of 2 positions shown; findings below may reference images not displayed]

FINDINGS: The heart size and mediastinal contours are within normal limits.
Both lungs are clear. The visualized skeletal structures are
unremarkable.
IMPRESSION: Negative.  No active cardiopulmonary disease.

## 2015-05-25 MED ORDER — IBUPROFEN 800 MG PO TABS: 800.0000 mg | ORAL_TABLET | Freq: Three times a day (TID) | ORAL | Status: DC | PRN

## 2015-05-25 NOTE — ED Notes (Signed)
Pt reports left sided neck and jaw pain x3 hours; reports some shooting pain into upper left side of chest; denies nausea or vomiting, reports shortness of breath.

## 2015-05-25 NOTE — ED Provider Notes (Signed)
[facility] Emergency Department Provider Note     Time seen: ----------------------------------------- 7:24 PM on 05/25/2015 -----------------------------------------    I have reviewed the triage vital signs and the nursing notes.   HISTORY  Chief Complaint Chest Pain    HPI Morgan Stevens is a 49 y.o. female who presents ER for left-sided neck and jaw pain for 3 hours. Patient reports some shooting pain up her left side of her chest. She denies any nausea or vomiting, reports shortness of breath. Patient has never had a history of same, does not note any recent physical activity that would've caused this pain.   Past Medical History  Diagnosis Date  . Thyroid disease   . Reflux     There are no active problems to display for this patient.   Past Surgical History  Procedure Laterality Date  . Thyroidectomy      Allergies Review of patient's allergies indicates no known allergies.  Social History Social History  Substance Use Topics  . Smoking status: Never Smoker   . Smokeless tobacco: None  . Alcohol Use: No    Review of Systems Constitutional: Negative for fever. Eyes: Negative for visual changes. ENT: Negative for sore throat. Cardiovascular: Positive for chest pain Respiratory: Negative for shortness of breath. Gastrointestinal: Negative for abdominal pain, vomiting and diarrhea. Genitourinary: Negative for dysuria. Musculoskeletal: Negative for back pain. Positive for neck pain Skin: Negative for rash. Neurological: Negative for headaches, focal weakness or numbness.  10-point ROS otherwise negative.  ____________________________________________   PHYSICAL EXAM:  VITAL SIGNS: ED Triage Vitals  Enc Vitals Group     BP 05/25/15 1608 147/73 mmHg     Pulse Rate 05/25/15 1608 73     Resp 05/25/15 1608 19     Temp 05/25/15 1608 97.9 F (36.6 C)     Temp Source 05/25/15 1608 Oral     SpO2 05/25/15 1608 100 %      Weight 05/25/15 1608 220 lb (99.791 kg)     Height 05/25/15 1608  (1.626 m)     Head Cir --      Peak Flow --      Pain Score 05/25/15 1612 8     Pain Loc --      Pain Edu? --      Excl. in GC? --     Constitutional: Alert and oriented. Well appearing and in no distress. Eyes: Conjunctivae are normal. PERRL. Normal extraocular movements. ENT   Head: Normocephalic and atraumatic.   Nose: No congestion/rhinnorhea.   Mouth/Throat: Mucous membranes are moist.   Neck: No stridor. Cardiovascular: Normal rate, regular rhythm. Normal and symmetric distal pulses are present in all extremities. No murmurs, rubs, or gallops. Respiratory: Normal respiratory effort without tachypnea nor retractions. Breath sounds are clear and equal bilaterally. No wheezes/rales/rhonchi. Gastrointestinal: Soft and nontender. No distention. No abdominal bruits.  Musculoskeletal: Nontender with normal range of motion in all extremities. No joint effusions.  No lower extremity tenderness nor edema. Neurologic:  Normal speech and language. No gross focal neurologic deficits are appreciated. Speech is normal. No gait instability. Skin:  Skin is warm, dry and intact. No rash noted. Psychiatric: Mood and affect are normal. Speech and behavior are normal. Patient exhibits appropriate insight and judgment. ____________________________________________  EKG: Interpreted by me. Normal sinus rhythm rate of 71 bpm, normal PR interval and normal QRS with, normal QT interval. No evidence of acute infarction  ____________________________________________  ED COURSE:  Pertinent labs & imaging results  that were available during my care of the patient were reviewed by me and considered in my medical decision making (see chart for details). Check cardiac labs and chest x-ray. ____________________________________________    LABS (pertinent positives/negatives)  Labs Reviewed  BASIC METABOLIC PANEL  CBC   TROPONIN I    RADIOLOGY  Chest x-ray is unremarkable  ____________________________________________  FINAL ASSESSMENT AND PLAN  Chest pain  Plan: Patient with labs and imaging as dictated above. No clear etiology for symptoms. Likely musculoskeletal. Some pain is elicited with range of motion left shoulder and left side of her neck. She is low risk for ACS. Heart score is low risk.   Emily Filbert, MD   Emily Filbert, MD 05/25/15 [id number]

## 2015-05-25 NOTE — Discharge Instructions (Signed)

## 2015-08-28 ENCOUNTER — Emergency Department
Admission: EM | Admit: 2015-08-28 | Discharge: 2015-08-28 | Disposition: A | Discharge status: Home / Self Care | Payer: Self-pay | Attending: Student | Admitting: Student

## 2015-08-28 ENCOUNTER — Encounter: Payer: Self-pay | Admitting: Emergency Medicine

## 2015-08-28 ENCOUNTER — Emergency Department: Payer: Self-pay

## 2015-08-28 DIAGNOSIS — Y9389 Activity, other specified: Secondary | ICD-10-CM | POA: Insufficient documentation

## 2015-08-28 DIAGNOSIS — X58XXXA Exposure to other specified factors, initial encounter: Secondary | ICD-10-CM | POA: Insufficient documentation

## 2015-08-28 DIAGNOSIS — Y998 Other external cause status: Secondary | ICD-10-CM | POA: Insufficient documentation

## 2015-08-28 DIAGNOSIS — S76012A Strain of muscle, fascia and tendon of left hip, initial encounter: Secondary | ICD-10-CM | POA: Insufficient documentation

## 2015-08-28 DIAGNOSIS — M79652 Pain in left thigh: Secondary | ICD-10-CM

## 2015-08-28 DIAGNOSIS — Y9289 Other specified places as the place of occurrence of the external cause: Secondary | ICD-10-CM | POA: Insufficient documentation

## 2015-08-28 MED ORDER — CYCLOBENZAPRINE HCL 5 MG PO TABS: 5.0000 mg | ORAL_TABLET | Freq: Three times a day (TID) | ORAL | Status: DC | PRN

## 2015-08-28 MED ORDER — NAPROXEN 500 MG PO TBEC: 500.0000 mg | DELAYED_RELEASE_TABLET | Freq: Two times a day (BID) | ORAL | Status: DC

## 2015-08-28 NOTE — ED Notes (Signed)
No hx of blood clots, not on birth control, no recent travel

## 2015-08-28 NOTE — ED Notes (Signed)
Denies injury.

## 2015-08-28 NOTE — Discharge Instructions (Signed)
Muscle Strain A muscle strain is an injury that occurs when a muscle is stretched beyond its normal length. Usually a small number of muscle fibers are torn when this happens. Muscle strain is rated in degrees. First-degree strains have the least amount of muscle fiber tearing and pain. Second-degree and third-degree strains have increasingly more tearing and pain.  Usually, recovery from muscle strain takes 1-2 weeks. Complete healing takes 5-6 weeks.  CAUSES  Muscle strain happens when a sudden, violent force placed on a muscle stretches it too far. This may occur with lifting, sports, or a fall.  RISK FACTORS Muscle strain is especially common in athletes.  SIGNS AND SYMPTOMS At the site of the muscle strain, there may be:  Pain.  Bruising.  Swelling.  Difficulty using the muscle due to pain or lack of normal function. DIAGNOSIS  Your health care provider will perform a physical exam and ask about your medical history. TREATMENT  Often, the best treatment for a muscle strain is resting, icing, and applying cold compresses to the injured area.  HOME CARE INSTRUCTIONS   Use the PRICE method of treatment to promote muscle healing during the first 2-3 days after your injury. The PRICE method involves:  Protecting the muscle from being injured again.  Restricting your activity and resting the injured body part.  Icing your injury. To do this, put ice in a plastic bag. Place a towel between your skin and the bag. Then, apply the ice and leave it on from 15-20 minutes each hour. After the third day, switch to moist heat packs.  Apply compression to the injured area with a splint or elastic bandage. Be careful not to wrap it too tightly. This may interfere with blood circulation or increase swelling.  Elevate the injured body part above the level of your heart as often as you can.  Only take over-the-counter or prescription medicines for pain, discomfort, or fever as directed by your  health care provider.  Warming up prior to exercise helps to prevent future muscle strains. SEEK MEDICAL CARE IF:   You have increasing pain or swelling in the injured area.  You have numbness, tingling, or a significant loss of strength in the injured area. MAKE SURE YOU:   Understand these instructions.  Will watch your condition.  Will get help right away if you are not doing well or get worse.   This information is not intended to replace advice given to you by your health care provider. Make sure you discuss any questions you have with your health care provider.   Document Released: 08/13/2005 Document Revised: 06/03/2013 Document Reviewed: 03/12/2013 Elsevier Interactive Patient Education 2016 Elsevier Inc.  [employer] Therapy Heat therapy can help ease sore, stiff, injured, and tight muscles and joints. Heat relaxes your muscles, which may help ease your pain.  RISKS AND COMPLICATIONS If you have any of the following conditions, do not use heat therapy unless your health care provider has approved:  Poor circulation.  Healing wounds or scarred skin in the area being treated.  Diabetes, heart disease, or high blood pressure.  Not being able to feel (numbness) the area being treated.  Unusual swelling of the area being treated.  Active infections.  Blood clots.  Cancer.  Inability to communicate pain. This may include young children and people who have problems with their brain function (dementia).  Pregnancy. Heat therapy should only be used on old, pre-existing, or long-lasting (chronic) injuries. Do not use heat therapy on new  injuries unless directed by your health care provider. HOW TO USE HEAT THERAPY There are several different kinds of heat therapy, including:  Moist heat pack.  Warm water bath.  Hot water bottle.  Electric heating pad.  Heated gel pack.  Heated wrap.  Electric heating pad. Use the heat therapy method suggested by your health care  provider. Follow your health care provider's instructions on when and how to use heat therapy. GENERAL HEAT THERAPY RECOMMENDATIONS  Do not sleep while using heat therapy. Only use heat therapy while you are awake.  Your skin may turn pink while using heat therapy. Do not use heat therapy if your skin turns red.  Do not use heat therapy if you have new pain.  High heat or long exposure to heat can cause burns. Be careful when using heat therapy to avoid burning your skin.  Do not use heat therapy on areas of your skin that are already irritated, such as with a rash or sunburn. SEEK MEDICAL CARE IF:  You have blisters, redness, swelling, or numbness.  You have new pain.  Your pain is worse. MAKE SURE YOU:  Understand these instructions.  Will watch your condition.  Will get help right away if you are not doing well or get worse.   This information is not intended to replace advice given to you by your health care provider. Make sure you discuss any questions you have with your health care provider.   Document Released: 11/05/2011 Document Revised: 09/03/2014 Document Reviewed: 10/06/2013 Elsevier Interactive Patient Education [employer] Elsevier Inc.   Your exam and ultrasound do not show a serious underlying cause of your hip pain. There is no evidence of a blood clot in the leg. Take the prescription meds as directed. Follow-up with Dr. Ernest PineHooten or your provider if symptoms persist.

## 2015-08-28 NOTE — ED Provider Notes (Signed)
[facility] Emergency Department Provider Note ____________________________________________  Time seen: 1525  I have reviewed the triage vital signs and the nursing notes.  HISTORY  Chief Complaint  Groin Pain  HPI Morgan Stevens is a 50 y.o. female reports to the ED for evaluation of left upper thigh and groin pain over the last 2-3 days. She denies any particular activity or injury in the interim. 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 Her thigh problems. She describes pain that is sharp and burning at times, and seems to be aggravated by walking. She describes that at times the pain will come on and halt her activities including walking. She describes the duration is just a fleeting pain to the inner thigh on the left side. She denies any, or trips associated with the pain to her left inner thigh. She is not aware of any skin color, or temp changes, and does not endorse any edema distally.She rates her discomfort at a 5/10 in triage. She has not had significant enough pain to warrant dosing any OTC meds for her discomfort.   Past Medical History  Diagnosis Date  . Thyroid disease   . Reflux     There are no active problems to display for this patient.   Past Surgical History  Procedure Laterality Date  . Thyroidectomy      Current Outpatient Rx  Name  Route  Sig  Dispense  Refill  . cyclobenzaprine (FLEXERIL) 5 MG tablet   Oral   Take 1 tablet (5 mg total) by mouth 3 (three) times daily as needed for muscle spasms.   15 tablet   0   . ibuprofen (ADVIL,MOTRIN) 800 MG tablet   Oral   Take 1 tablet (800 mg total) by mouth every 8 (eight) hours as needed.   30 tablet   0   . naproxen (EC NAPROSYN) 500 MG EC tablet   Oral   Take 1 tablet (500 mg total) by mouth 2 (two) times daily with a meal.   30 tablet   0    Allergies Review of patient's allergies indicates no known allergies.  History reviewed. No pertinent  family history.  Social History Social History  Substance Use Topics  . Smoking status: Never Smoker   . Smokeless tobacco: None  . Alcohol Use: No   Review of Systems  Constitutional: Negative for fever. Eyes: Negative for visual changes. ENT: Negative for sore throat. Cardiovascular: Negative for chest pain. Respiratory: Negative for shortness of breath. Gastrointestinal: Negative for abdominal pain, vomiting and diarrhea. Genitourinary: Negative for dysuria. Musculoskeletal: Negative for back pain. Left thigh pain as above. Skin: Negative for rash. Neurological: Negative for headaches, focal weakness or numbness. ____________________________________________  PHYSICAL EXAM:  VITAL SIGNS: ED Triage Vitals  Enc Vitals Group     BP 08/28/15 1437 126/82 mmHg     Pulse Rate 08/28/15 1437 69     Resp 08/28/15 1437 16     Temp 08/28/15 1437 98.3 F (36.8 C)     Temp Source 08/28/15 1437 Oral     SpO2 08/28/15 1437 99 %     Weight 08/28/15 1437 228 lb (103.42 kg)     Height 08/28/15 1437 5\' 4"  (1.626 m)     Head Cir --      Peak Flow --      Pain Score 08/28/15 1437 5     Pain Loc --      Pain Edu? --  Excl. in GC? --    Constitutional: Alert and oriented. Well appearing and in no distress. Head: Normocephalic and atraumatic.      Eyes: Conjunctivae are normal. PERRL. Normal extraocular movements      Ears: Canals clear. TMs intact bilaterally.   Nose: No congestion/rhinorrhea.   Mouth/Throat: Mucous membranes are moist.   Neck: Supple. No thyromegaly. Hematological/Lymphatic/Immunological: No cervical lymphadenopathy. Cardiovascular: Normal rate, regular rhythm. Normal distal pulses.  Respiratory: Normal respiratory effort. No wheezes/rales/rhonchi. Gastrointestinal: Soft and nontender. No distention. Musculoskeletal: Normal spinal alignment. Normal sit to stand transition. Normal left hip ROM without deficit. Tenderness to palp along the medial quad at  the gracilis and sartorius. Knee exam is unremarkable. Nontender with normal range of motion in all extremities.  Neurologic:  Normal gait without ataxia. Normal speech and language. No gross focal neurologic deficits are appreciated. Skin:  Skin is warm, dry and intact. No rash noted. Psychiatric: Mood and affect are normal. Patient exhibits appropriate insight and judgment. ____________________________________________   RADIOLOGY  LLE Ultrasound/Doppler IMPRESSION: No evidence of deep venous thrombosis. ____________________________________________  INITIAL IMPRESSION / ASSESSMENT AND PLAN / ED COURSE  Patient with what appears to be muscle strain the the adductors and hip flexors medially. She is reassured by her normal doppler study. She will be discharged with a prescription for EC Naprosyn and Flexeril. She is encouraged to follow-up with Dr. Ernest PineHooten for ongoing symptoms.  ____________________________________________  FINAL CLINICAL IMPRESSION(S) / ED DIAGNOSES  Final diagnoses:  Thigh pain, musculoskeletal, left  Strain of flexor muscle of left hip, initial encounter      Lissa HoardJenise V Bacon Aaminah Forrester, PA-C 08/28/15 1828  Gayla DossEryka A Gayle, MD 08/28/15 2044

## 2015-09-02 NOTE — Telephone Encounter (Signed)
This encounter was created in error - please disregard.

## 2015-09-06 ENCOUNTER — Encounter: Payer: Self-pay | Admitting: Emergency Medicine

## 2015-09-06 ENCOUNTER — Emergency Department: Payer: Self-pay

## 2015-09-06 ENCOUNTER — Observation Stay
Admission: EM | Admit: 2015-09-06 | Discharge: 2015-09-07 | Disposition: A | Discharge status: Home / Self Care | Payer: Self-pay | Attending: Internal Medicine | Admitting: Internal Medicine

## 2015-09-06 DIAGNOSIS — E876 Hypokalemia: Secondary | ICD-10-CM | POA: Insufficient documentation

## 2015-09-06 DIAGNOSIS — E039 Hypothyroidism, unspecified: Secondary | ICD-10-CM | POA: Insufficient documentation

## 2015-09-06 DIAGNOSIS — E669 Obesity, unspecified: Secondary | ICD-10-CM | POA: Insufficient documentation

## 2015-09-06 DIAGNOSIS — K219 Gastro-esophageal reflux disease without esophagitis: Secondary | ICD-10-CM | POA: Insufficient documentation

## 2015-09-06 DIAGNOSIS — Z791 Long term (current) use of non-steroidal anti-inflammatories (NSAID): Secondary | ICD-10-CM | POA: Insufficient documentation

## 2015-09-06 DIAGNOSIS — Z6838 Body mass index (BMI) 38.0-38.9, adult: Secondary | ICD-10-CM | POA: Insufficient documentation

## 2015-09-06 DIAGNOSIS — Z7982 Long term (current) use of aspirin: Secondary | ICD-10-CM | POA: Insufficient documentation

## 2015-09-06 DIAGNOSIS — E079 Disorder of thyroid, unspecified: Secondary | ICD-10-CM | POA: Insufficient documentation

## 2015-09-06 DIAGNOSIS — R0682 Tachypnea, not elsewhere classified: Secondary | ICD-10-CM | POA: Insufficient documentation

## 2015-09-06 DIAGNOSIS — R079 Chest pain, unspecified: Principal | ICD-10-CM | POA: Diagnosis present

## 2015-09-06 DIAGNOSIS — Z79899 Other long term (current) drug therapy: Secondary | ICD-10-CM | POA: Insufficient documentation

## 2015-09-06 DIAGNOSIS — R0602 Shortness of breath: Secondary | ICD-10-CM | POA: Insufficient documentation

## 2015-09-06 DIAGNOSIS — R0600 Dyspnea, unspecified: Secondary | ICD-10-CM | POA: Insufficient documentation

## 2015-09-06 LAB — BASIC METABOLIC PANEL
Anion gap: 8 (ref 5–15)
BUN: 9 mg/dL (ref 6–20)
CO2: 25 mmol/L (ref 22–32)
Calcium: 9.3 mg/dL (ref 8.9–10.3)
Chloride: 105 mmol/L (ref 101–111)
Creatinine, Ser: 0.7 mg/dL (ref 0.44–1.00)
GFR calc Af Amer: >60 mL/min (ref >60)
GFR calc non Af Amer: >60 mL/min (ref >60)
Glucose, Bld: 135 mg/dL — ABNORMAL HIGH (ref 65–99)
Potassium: 3.3 mmol/L — ABNORMAL LOW (ref 3.5–5.1)
Sodium: 138 mmol/L (ref 135–145)

## 2015-09-06 LAB — CBC
HCT: 37.7 % (ref 35.0–47.0)
Hemoglobin: 12.9 g/dL (ref 12.0–16.0)
MCH: 29.7 pg (ref 26.0–34.0)
MCHC: 34.2 g/dL (ref 32.0–36.0)
MCV: 87.1 fL (ref 80.0–100.0)
Platelets: 292 10*3/uL (ref 150–440)
RBC: 4.33 MIL/uL (ref 3.80–5.20)
RDW: 13.7 % (ref 11.5–14.5)
WBC: 7.8 10*3/uL (ref 3.6–11.0)

## 2015-09-06 LAB — TROPONIN I: Troponin I: <0.03 ng/mL (ref <0.031)

## 2015-09-06 IMAGING — CT CT ANGIO CHEST
1 of 2 series · 18 of 30 positions shown · IV contrast (APPLIED)
Comparison: Radiograph dated [DATE]

CLINICAL DATA: 49-year-old female with dyspnea and chest pain.

EXAM:
CT ANGIOGRAPHY CHEST WITH CONTRAST
TECHNIQUE: Multidetector CT imaging of the chest was performed using the
standard protocol during bolus administration of intravenous
contrast. Multiplanar CT image reconstructions and MIPs were
obtained to evaluate the vascular anatomy.
CONTRAST:  100mL OMNIPAQUE IOHEXOL 350 MG/ML SOLN

[Series 5: pe 1.0 thins · axial · 0.80mm/px · z∈[-558,-326]mm · 18 of 261 slices shown]
[im 15/261  lung]
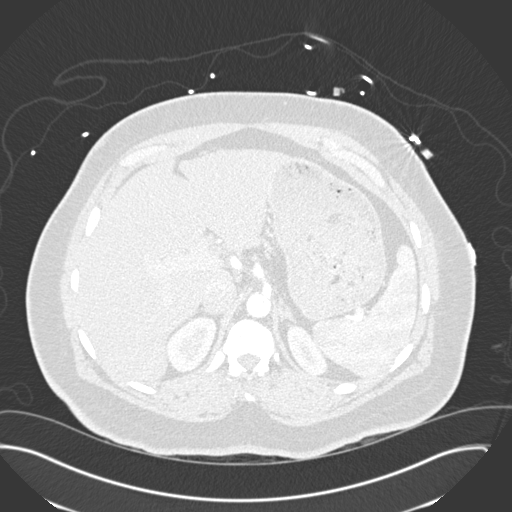
[im 29/261  mediastinal]
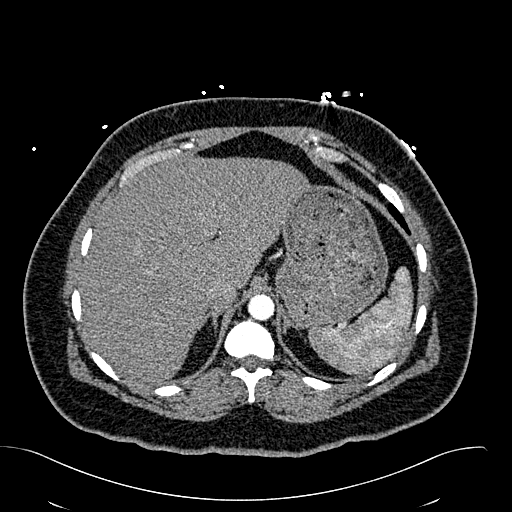
[im 44/261  lung]
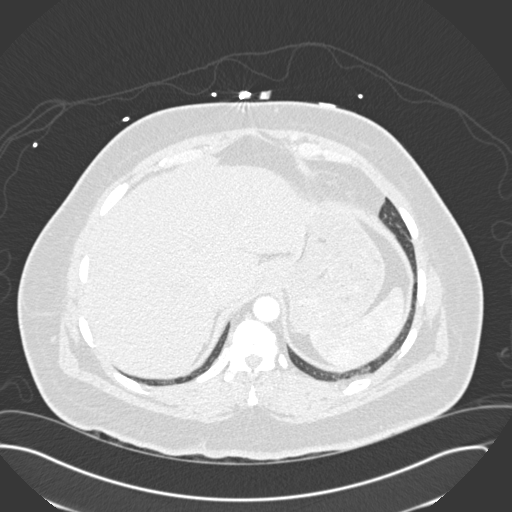
[im 58/261  mediastinal]
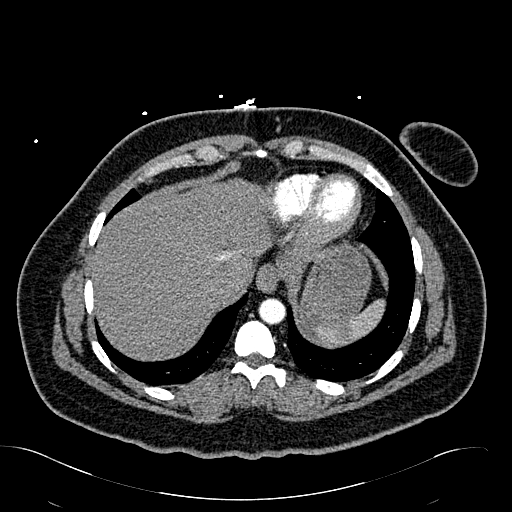
[im 73/261  lung]
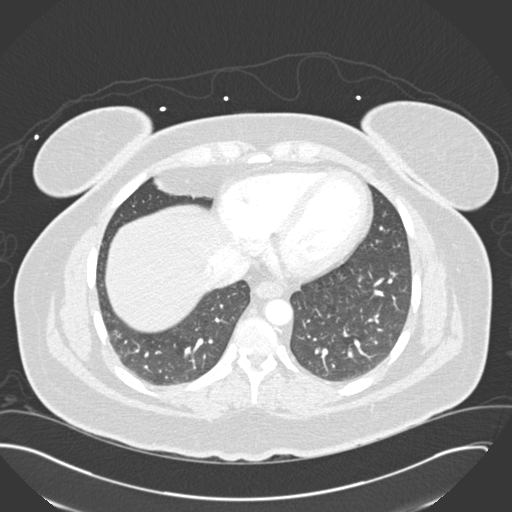
[im 87/261  mediastinal]
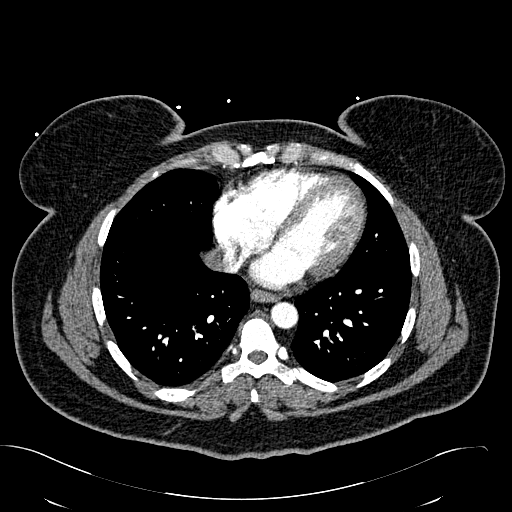
[im 102/261  lung]
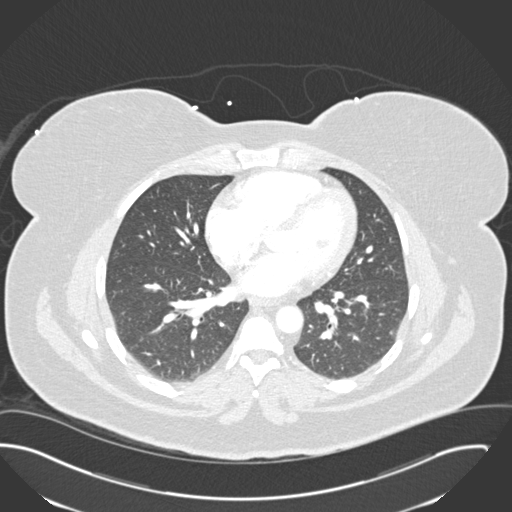
[im 116/261  mediastinal]
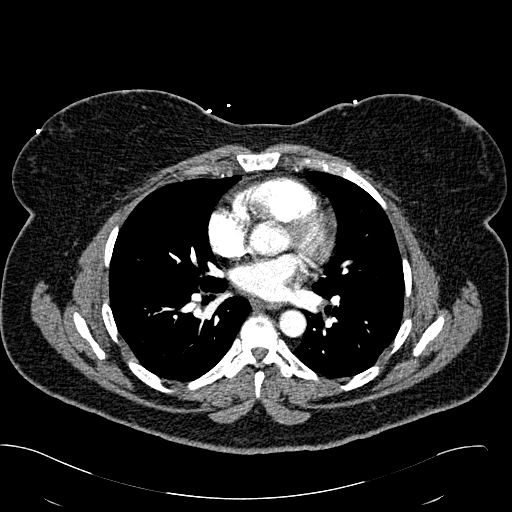
[im 121/261  lung]
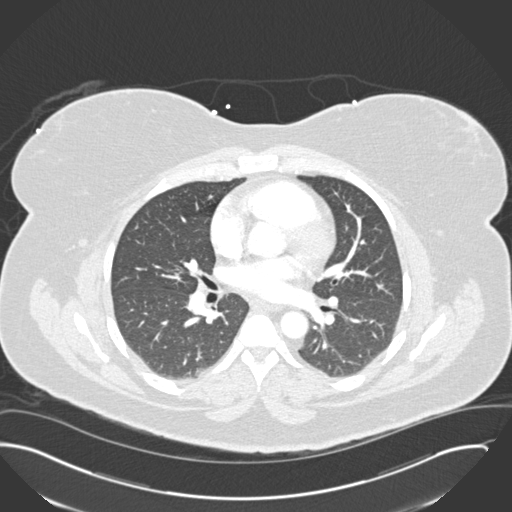
[im 131/261  mediastinal]
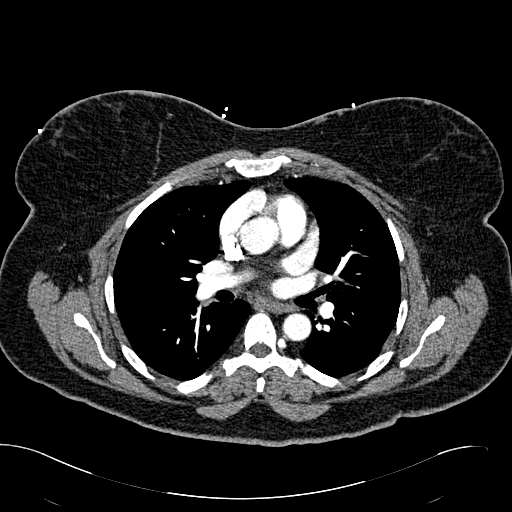
[im 145/261  lung]
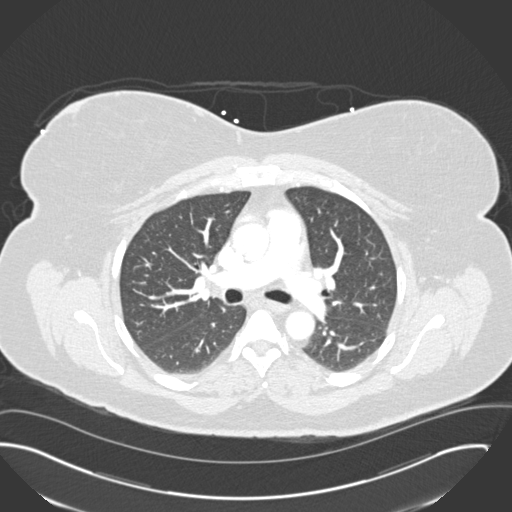
[im 159/261  mediastinal]
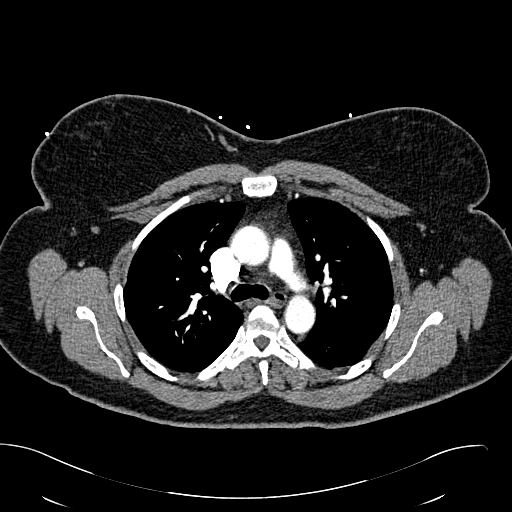
[im 174/261  lung]
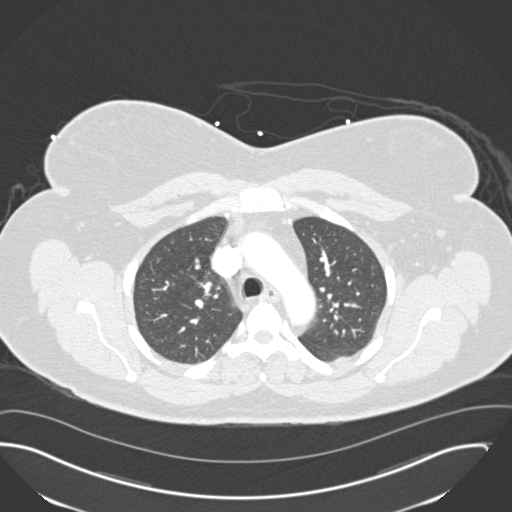
[im 188/261  mediastinal]
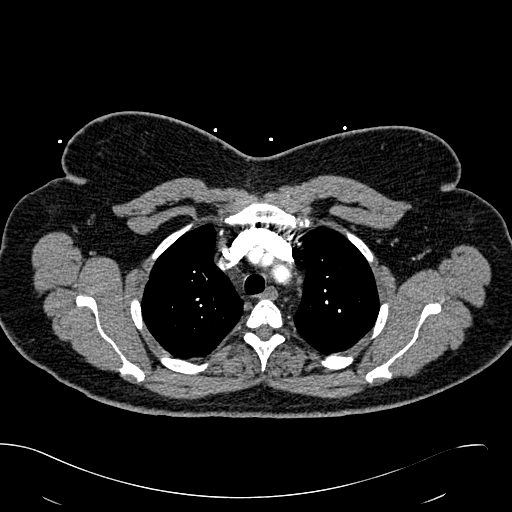
[im 203/261  lung]
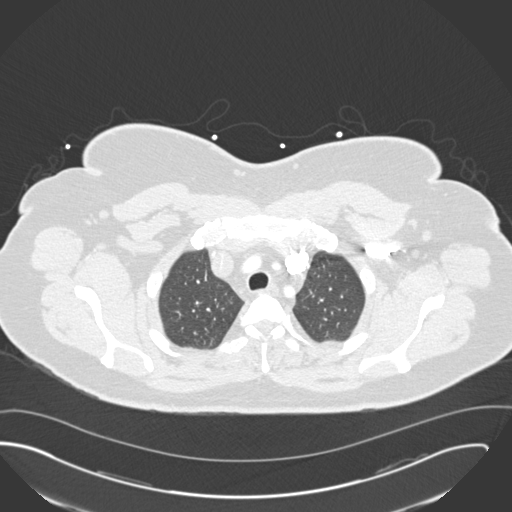
[im 217/261  mediastinal]
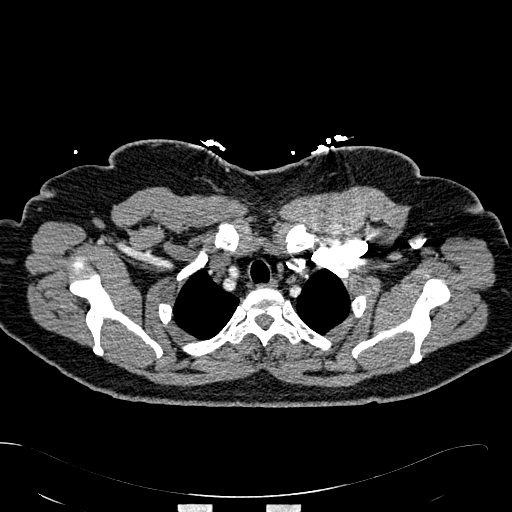
[im 232/261  lung]
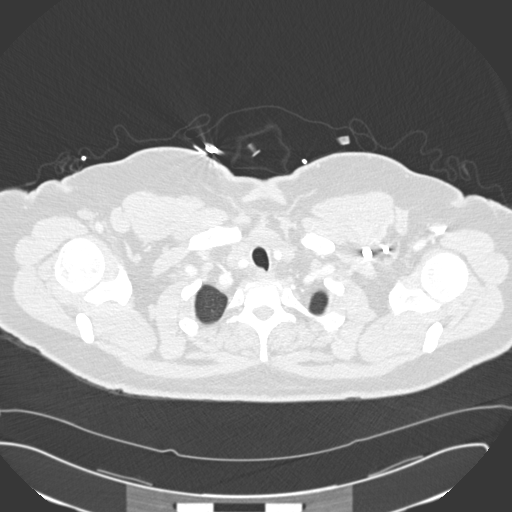
[im 246/261  mediastinal]
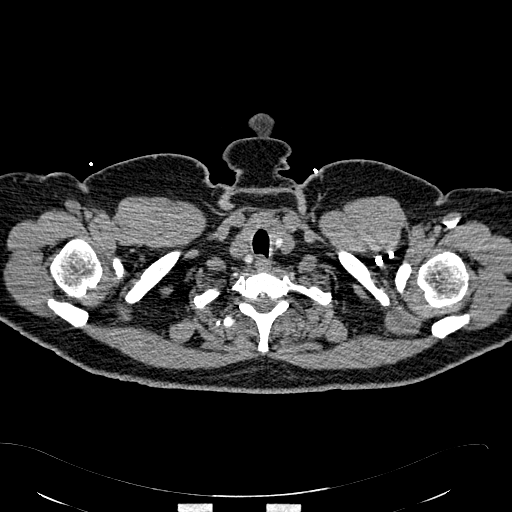

[18 of 30 positions shown; findings below may reference images not displayed]

FINDINGS: The lungs are clear. No pleural effusion or pneumothorax. The
central airways are patent.

The thoracic aorta appears unremarkable. No CT evidence of pulmonary
embolism. No cardiomegaly or pericardial effusion. There is no hilar
or mediastinal adenopathy.

The esophagus appears grossly unremarkable. There is no axillary
adenopathy. The chest wall soft tissues appear unremarkable. The
osseous structures are intact. The visualized upper abdomen is
unremarkable.

Review of the MIP images confirms the above findings.
IMPRESSION: No CT evidence of pulmonary embolism.

## 2015-09-06 IMAGING — CR DG CHEST 1V PORT
1 series · 1 of 1 positions shown · non-contrast
Comparison: Radiograph dated [DATE]

CLINICAL DATA: 49-year-old female with left-sided chest pain

EXAM:
PORTABLE CHEST 1 VIEW

[portable]
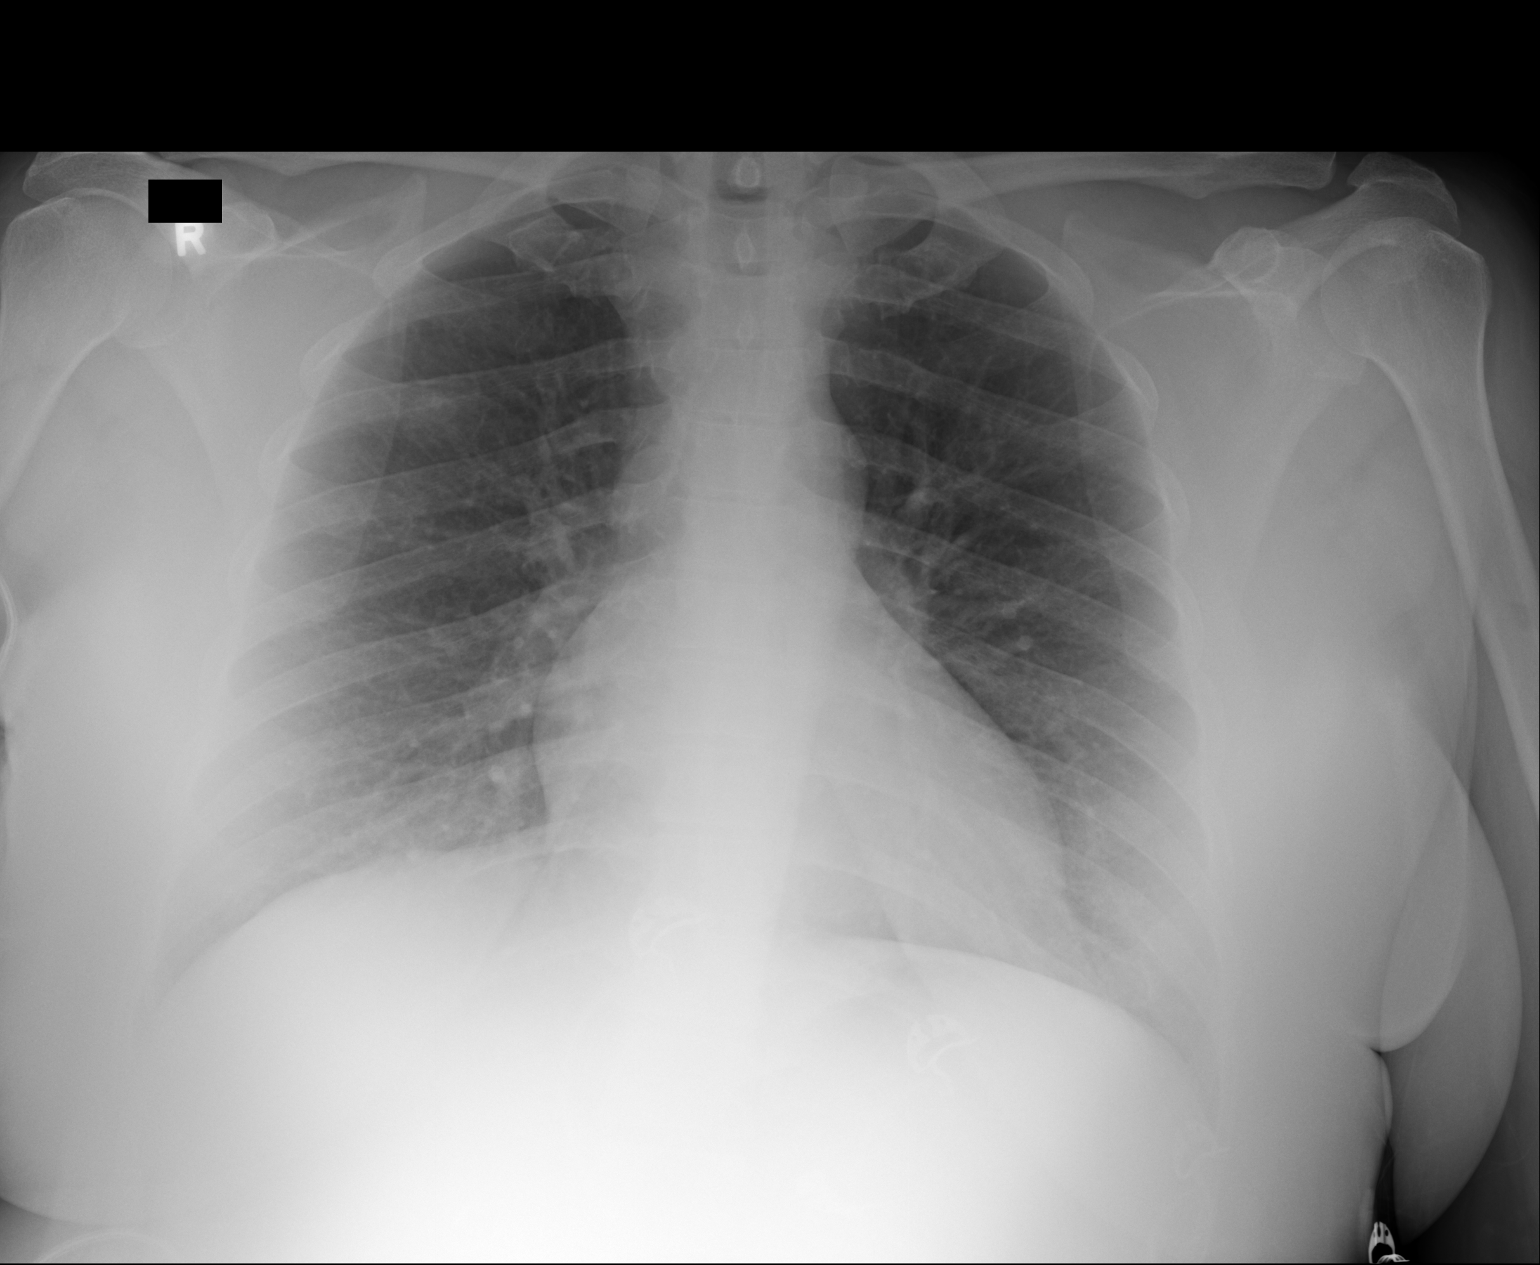

[1 of 1 positions shown; findings below may reference images not displayed]

FINDINGS: The heart size and mediastinal contours are within normal limits.
Both lungs are clear. The visualized skeletal structures are
unremarkable.
IMPRESSION: No active disease.

## 2015-09-06 MED ORDER — IOHEXOL 350 MG/ML SOLN
100.0000 mL | Freq: Once | INTRAVENOUS | Status: AC | PRN
  Administered 2015-09-06: 100 mL via INTRAVENOUS

## 2015-09-06 MED ORDER — ASPIRIN 81 MG PO CHEW
324.0000 mg | CHEWABLE_TABLET | Freq: Once | ORAL | Status: AC
  Administered 2015-09-06: 324 mg via ORAL

## 2015-09-06 MED ORDER — NITROGLYCERIN 0.4 MG SL SUBL
SUBLINGUAL_TABLET | SUBLINGUAL | Status: AC
  Administered 2015-09-06: 0.4 mg via SUBLINGUAL
  Filled 2015-09-06: qty 1

## 2015-09-06 MED ORDER — ASPIRIN 81 MG PO CHEW
CHEWABLE_TABLET | ORAL | Status: AC
  Administered 2015-09-06: 324 mg via ORAL
  Filled 2015-09-06: qty 4

## 2015-09-06 MED ORDER — NITROGLYCERIN 0.4 MG SL SUBL
0.4000 mg | SUBLINGUAL_TABLET | SUBLINGUAL | Status: DC | PRN
  Administered 2015-09-06: 0.4 mg via SUBLINGUAL

## 2015-09-06 NOTE — ED Notes (Addendum)
Pt to triage via w/c, tearful & anxious; c/o pain to left arm radiating into left side neck accomp by SOB; denies hx of same; EKG in triage reading acute MI; pt taken to room 2 via w/c; charge nurse and Dr Manson PasseyBrown notified; pt placed on card monitor to obtain repeat EKG

## 2015-09-06 NOTE — ED Notes (Signed)
MD Brown at bedside.

## 2015-09-06 NOTE — ED Provider Notes (Signed)
[facility] Emergency Department Provider Note  ____________________________________________  Time seen: 11:00 PM  I have reviewed the triage vital signs and the nursing notes.   HISTORY  Chief Complaint Chest Pain      HPI Morgan Stevens is a 50 y.o. female presents with central chest pain with radiation to left arm currently 7 out of 10 acute onset today. In addition patient also admits to dyspnea. Patient denies any nausea no vomiting fever or cough. Of note patient was recently seen in the emergency department on 08/28/2015 for left thigh pain. Patient denies any history of DVT or PE. No personal or immediately family history of myocardial infarction. However the patient's mother had a history is CHF and mitral valve replacement.    Past Medical History  Diagnosis Date  . Thyroid disease   . Reflux     There are no active problems to display for this patient.   Past Surgical History  Procedure Laterality Date  . Thyroidectomy      Current Outpatient Rx  Name  Route  Sig  Dispense  Refill  . cyclobenzaprine (FLEXERIL) 5 MG tablet   Oral   Take 1 tablet (5 mg total) by mouth 3 (three) times daily as needed for muscle spasms.   15 tablet   0   . ibuprofen (ADVIL,MOTRIN) 800 MG tablet   Oral   Take 1 tablet (800 mg total) by mouth every 8 (eight) hours as needed.   30 tablet   0   . naproxen (EC NAPROSYN) 500 MG EC tablet   Oral   Take 1 tablet (500 mg total) by mouth 2 (two) times daily with a meal.   30 tablet   0     Allergies Review of patient's allergies indicates no known allergies.  No family history on file.  Social History Social History  Substance Use Topics  . Smoking status: Never Smoker   . Smokeless tobacco: None  . Alcohol Use: No    Review of Systems  Constitutional: Negative for fever. Eyes: Negative for visual changes. ENT: Negative for sore throat. Cardiovascular: Positive for chest  pain. Respiratory: Positive for shortness of breath. Gastrointestinal: Negative for abdominal pain, vomiting and diarrhea. Genitourinary: Negative for dysuria. Musculoskeletal: Negative for back pain. Skin: Negative for rash. Neurological: Negative for headaches, focal weakness or numbness.   10-point ROS otherwise negative.  ____________________________________________   PHYSICAL EXAM:  VITAL SIGNS: ED Triage Vitals  Enc Vitals Group     BP 09/06/15 2251 157/81 mmHg     Pulse Rate 09/06/15 2251 101     Resp 09/06/15 2251 24     Temp 09/06/15 2251 97.8 F (36.6 C)     Temp Source 09/06/15 2251 Oral     SpO2 09/06/15 2251 100 %     Weight 09/06/15 2251 228 lb (103.42 kg)     Height 09/06/15 2251 5\' 4"  (1.626 m)     Head Cir --      Peak Flow --      Pain Score 09/06/15 2251 6     Pain Loc --      Pain Edu? --      Excl. in GC? --      Constitutional: Alert and oriented. Apparent discomfort Eyes: Conjunctivae are normal. PERRL. Normal extraocular movements. ENT   Head: Normocephalic and atraumatic.   Nose: No congestion/rhinnorhea.   Mouth/Throat: Mucous membranes are moist.   Neck: No stridor. Hematological/Lymphatic/Immunilogical: No cervical lymphadenopathy. Cardiovascular: Normal  rate, regular rhythm. Normal and symmetric distal pulses are present in all extremities. No murmurs, rubs, or gallops. Respiratory: Normal respiratory effort without tachypnea nor retractions. Breath sounds are clear and equal bilaterally. No wheezes/rales/rhonchi. Gastrointestinal: Soft and nontender. No distention. There is no CVA tenderness. Genitourinary: deferred Musculoskeletal: Nontender with normal range of motion in all extremities. No joint effusions.  No lower extremity tenderness nor edema. Neurologic:  Normal speech and language. No gross focal neurologic deficits are appreciated. Speech is normal.  Skin:  Skin is warm, dry and intact. No rash noted. Psychiatric:  Mood and affect are normal. Speech and behavior are normal. Patient exhibits appropriate insight and judgment.  ____________________________________________    LABS (pertinent positives/negatives)  Labs Reviewed  BASIC METABOLIC PANEL - Abnormal; Notable for the following:    Potassium 3.3 (*)    Glucose, Bld 135 (*)    All other components within normal limits  CBC  TROPONIN I     ____________________________________________   EKG  ED ECG REPORT I, Scotlynn Noyes, [place] N, the attending physician, personally viewed and interpreted this ECG.   Date: 09/06/2015  EKG Time: 10:53 PM  Rate: 86  Rhythm: Normal sinus rhythm  Axis: None  Intervals: Normal  ST&T Change: None   ____________________________________________    RADIOLOGY     CT Angio Chest PE W/Cm &/Or Wo Cm (Final result) Result time: 09/07/15 00:02:47   Final result by Rad Results In Interface (09/07/15 00:02:47)   Narrative:   CLINICAL DATA: 50 year old female with dyspnea and chest pain.  EXAM: CT ANGIOGRAPHY CHEST WITH CONTRAST  TECHNIQUE: Multidetector CT imaging of the chest was performed using the standard protocol during bolus administration of intravenous contrast. Multiplanar CT image reconstructions and MIPs were obtained to evaluate the vascular anatomy.  CONTRAST: 100mL OMNIPAQUE IOHEXOL 350 MG/ML SOLN  COMPARISON: Radiograph dated 09/06/2015  FINDINGS: The lungs are clear. No pleural effusion or pneumothorax. The central airways are patent.  The thoracic aorta appears unremarkable. No CT evidence of pulmonary embolism. No cardiomegaly or pericardial effusion. There is no hilar or mediastinal adenopathy.  The esophagus appears grossly unremarkable. There is no axillary adenopathy. The chest wall soft tissues appear unremarkable. The osseous structures are intact. The visualized upper abdomen is unremarkable.  Review of the MIP images confirms the above  findings.  IMPRESSION: No CT evidence of pulmonary embolism.   Electronically Signed By: Elgie CollardArash Radparvar M.D. On: 09/07/2015 00:02          DG Chest Portable 1 View (Final result) Result time: 09/06/15 23:30:59   Final result by Rad Results In Interface (09/06/15 23:30:59)   Narrative:   CLINICAL DATA: 50 year old female with left-sided chest pain  EXAM: PORTABLE CHEST 1 VIEW  COMPARISON: Radiograph dated 05/25/2015  FINDINGS: The heart size and mediastinal contours are within normal limits. Both lungs are clear. The visualized skeletal structures are unremarkable.  IMPRESSION: No active disease.   Electronically Signed By: Elgie CollardArash Radparvar M.D. On: 09/06/2015 23:30         INITIAL IMPRESSION / ASSESSMENT AND PLAN / ED COURSE  Pertinent labs & imaging results that were available during my care of the patient were reviewed by me and considered in my medical decision making (see chart for details).  Patient received 324 aspirin as well as one sublingual nitroglycerin. Patient's pain resolved following nitroglycerin. History of physical exam concerning for possible cardiac etiology of chest pain as such we'll admit the patient to the hospital for further evaluation and management. Patient discussed  with Dr. Anne Hahn hospitalist on call  ____________________________________________   FINAL CLINICAL IMPRESSION(S) / ED DIAGNOSES  Final diagnoses:  Tachypnea  Dyspnea  Chest pain      Darci Current, MD 09/07/15 [phone]

## 2015-09-06 NOTE — ED Notes (Addendum)
Pt placed on 2L [place] at this time for SOB and comfort. Sating 100%

## 2015-09-06 NOTE — ED Notes (Signed)
Patient transported to CT 

## 2015-09-06 NOTE — ED Notes (Signed)
Xray at bedside taking portable.

## 2015-09-07 ENCOUNTER — Encounter: Payer: Self-pay | Admitting: Internal Medicine

## 2015-09-07 DIAGNOSIS — R079 Chest pain, unspecified: Secondary | ICD-10-CM | POA: Diagnosis present

## 2015-09-07 LAB — LIPID PANEL
Cholesterol: 160 mg/dL (ref 0–200)
HDL: 41 mg/dL (ref >40)
LDL Cholesterol: 98 mg/dL (ref 0–99)
Total CHOL/HDL Ratio: 3.9 RATIO
Triglycerides: 103 mg/dL (ref <150)
VLDL: 21 mg/dL (ref 0–40)

## 2015-09-07 LAB — TROPONIN I
Troponin I: <0.03 ng/mL (ref <0.031)
Troponin I: <0.03 ng/mL (ref <0.031)

## 2015-09-07 MED ORDER — NITROGLYCERIN 2 % TD OINT
1.0000 [in_us] | TOPICAL_OINTMENT | Freq: Four times a day (QID) | TRANSDERMAL | Status: DC
  Administered 2015-09-07: 09:00:00 1 [in_us] via TOPICAL
  Filled 2015-09-07: qty 1

## 2015-09-07 MED ORDER — POTASSIUM CHLORIDE CRYS ER 20 MEQ PO TBCR
20.0000 meq | EXTENDED_RELEASE_TABLET | Freq: Once | ORAL | Status: AC
  Administered 2015-09-07: 20 meq via ORAL
  Filled 2015-09-07: qty 1

## 2015-09-07 MED ORDER — CYCLOBENZAPRINE HCL 10 MG PO TABS: 5.0000 mg | ORAL_TABLET | Freq: Three times a day (TID) | ORAL | Status: DC | PRN

## 2015-09-07 MED ORDER — NITROGLYCERIN 0.4 MG SL SUBL: 0.4000 mg | SUBLINGUAL_TABLET | SUBLINGUAL | Status: DC | PRN

## 2015-09-07 MED ORDER — SODIUM CHLORIDE 0.9 % IJ SOLN
3.0000 mL | Freq: Two times a day (BID) | INTRAMUSCULAR | Status: DC
  Administered 2015-09-07: 11:00:00 3 mL via INTRAVENOUS

## 2015-09-07 MED ORDER — ASPIRIN EC 81 MG PO TBEC: 81.0000 mg | DELAYED_RELEASE_TABLET | Freq: Every day | ORAL | Status: DC

## 2015-09-07 MED ORDER — SODIUM CHLORIDE 0.9 % IV SOLN: 250.0000 mL | INTRAVENOUS | Status: DC | PRN

## 2015-09-07 MED ORDER — PANTOPRAZOLE SODIUM 40 MG PO TBEC: 40.0000 mg | DELAYED_RELEASE_TABLET | Freq: Every day | ORAL | Status: DC

## 2015-09-07 MED ORDER — ASPIRIN 81 MG PO CHEW: 324.0000 mg | CHEWABLE_TABLET | ORAL | Status: DC

## 2015-09-07 MED ORDER — ENOXAPARIN SODIUM 40 MG/0.4ML ~~LOC~~ SOLN
40.0000 mg | SUBCUTANEOUS | Status: DC
  Administered 2015-09-07: 11:00:00 40 mg via SUBCUTANEOUS
  Filled 2015-09-07: qty 0.4

## 2015-09-07 MED ORDER — SODIUM CHLORIDE 0.9 % IJ SOLN: 3.0000 mL | INTRAMUSCULAR | Status: DC | PRN

## 2015-09-07 MED ORDER — ACETAMINOPHEN 325 MG PO TABS: 650.0000 mg | ORAL_TABLET | ORAL | Status: DC | PRN

## 2015-09-07 MED ORDER — ONDANSETRON HCL 4 MG/2ML IJ SOLN: 4.0000 mg | Freq: Four times a day (QID) | INTRAMUSCULAR | Status: DC | PRN

## 2015-09-07 MED ORDER — ASPIRIN 300 MG RE SUPP: 300.0000 mg | RECTAL | Status: DC

## 2015-09-07 MED ORDER — LEVOTHYROXINE SODIUM 137 MCG PO TABS: 137.0000 ug | ORAL_TABLET | Freq: Every day | ORAL | Status: DC

## 2015-09-07 NOTE — ED Notes (Signed)
[facility].  Pt awaiting admission at this time states is pain free, monitor in place, no distress noted.

## 2015-09-07 NOTE — ED Notes (Signed)
MD Brown at bedside.

## 2015-09-07 NOTE — H&P (Addendum)
[facility] - [place] at [facility]   PATIENT NAME: Morgan Stevens    MR#:  [id number]  DATE OF BIRTH:  1966-06-07  DATE OF ADMISSION:  09/06/2015  PRIMARY CARE PHYSICIAN: [facility]   REQUESTING/REFERRING PHYSICIAN:   CHIEF COMPLAINT:   Chief Complaint  Patient presents with  . Chest Pain    HISTORY OF PRESENT ILLNESS: Morgan Stevens  is a 50 y.o. female with a known history of thyroid disease, GERD presented to the emergency room with chest pain since 10 PM last night. Patient was relaxing at home and she experienced chest pain. Chest pain is located in the left side of the chest radiated to left arm and left side of the neck. Pain was initially aching later on she felt it as heaviness in the chest 10 out of 10 on a scale of 1-10. Pain was relieved with nitrates after she received it in the emergency room. Patient was worked up with EKG and first set of troponin was negative. No prior history of cardiac workup. No history of shortness of breath, orthopnea or paroxysmal nocturnal dyspnea.  PAST MEDICAL HISTORY:   Past Medical History  Diagnosis Date  . Thyroid disease   . Reflux     PAST SURGICAL HISTORY: Past Surgical History  Procedure Laterality Date  . Thyroidectomy    . Cholecystectomy      SOCIAL HISTORY:  Social History  Substance Use Topics  . Smoking status: Never Smoker   . Smokeless tobacco: Not on file  . Alcohol Use: No    FAMILY HISTORY: No family history on file.  DRUG ALLERGIES: No Known Allergies  REVIEW OF SYSTEMS:   CONSTITUTIONAL: No fever, fatigue or weakness.  EYES: No blurred or double vision.  EARS, NOSE, AND THROAT: No tinnitus or ear pain.  RESPIRATORY: No cough, shortness of breath, wheezing or hemoptysis.  CARDIOVASCULAR: Has chest pain, no orthopnea, edema.  GASTROINTESTINAL: No nausea, vomiting, diarrhea or abdominal pain.  GENITOURINARY: No dysuria, hematuria.  ENDOCRINE: No polyuria,  nocturia,  HEMATOLOGY: No anemia, easy bruising or bleeding SKIN: No rash or lesion. MUSCULOSKELETAL: No joint pain or arthritis.   NEUROLOGIC: No tingling, numbness, weakness.  PSYCHIATRY: No anxiety or depression.   MEDICATIONS AT HOME:  Prior to Admission medications   Medication Sig Start Date End Date Taking? Authorizing Provider  cyclobenzaprine (FLEXERIL) 5 MG tablet Take 1 tablet (5 mg total) by mouth 3 (three) times daily as needed for muscle spasms. 08/28/15   Jenise V Bacon Menshew, PA-C  ibuprofen (ADVIL,MOTRIN) 800 MG tablet Take 1 tablet (800 mg total) by mouth every 8 (eight) hours as needed. 05/25/15   Emily Filbert, MD  naproxen (EC NAPROSYN) 500 MG EC tablet Take 1 tablet (500 mg total) by mouth 2 (two) times daily with a meal. 08/28/15   Jenise V Bacon Menshew, PA-C      PHYSICAL EXAMINATION:   VITAL SIGNS: Blood pressure 118/71, pulse 76, temperature 97.8 F (36.6 C), temperature source Oral, resp. rate 16, height 5\' 4"  (1.626 m), weight 103.42 kg (228 lb), last menstrual period 08/20/2015, SpO2 100 %.  GENERAL:  50 y.o.-year-old patient lying in the bed with no acute distress.  EYES: Pupils equal, round, reactive to light and accommodation. No scleral icterus. Extraocular muscles intact.  HEENT: Head atraumatic, normocephalic. Oropharynx and nasopharynx clear.  NECK:  Supple, no jugular venous distention. No thyroid enlargement, no tenderness.  LUNGS: Normal breath sounds bilaterally, no wheezing, rales,rhonchi or  crepitation. No use of accessory muscles of respiration.  CARDIOVASCULAR: S1, S2 normal. No murmurs, rubs, or gallops.  ABDOMEN: Soft, nontender, nondistended. Bowel sounds present. No organomegaly or mass.  EXTREMITIES: No pedal edema, cyanosis, or clubbing.  NEUROLOGIC: Cranial nerves II through XII are intact. Muscle strength 5/5 in all extremities. Sensation intact. Gait not checked.  PSYCHIATRIC: The patient is alert and oriented x 3.  SKIN: No  obvious rash, lesion, or ulcer.   LABORATORY PANEL:   CBC  Recent Labs Lab 09/06/15 2302  WBC 7.8  HGB 12.9  HCT 37.7  PLT 292  MCV 87.1  MCH 29.7  MCHC 34.2  RDW 13.7   ------------------------------------------------------------------------------------------------------------------  Chemistries   Recent Labs Lab 09/06/15 2302  NA 138  K 3.3*  CL 105  CO2 25  GLUCOSE 135*  BUN 9  CREATININE 0.70  CALCIUM 9.3   ------------------------------------------------------------------------------------------------------------------ estimated creatinine clearance is 101.8 mL/min (by C-G formula based on Cr of 0.7). ------------------------------------------------------------------------------------------------------------------ No results for input(s): TSH, T4TOTAL, T3FREE, THYROIDAB in the last 72 hours.  Invalid input(s): FREET3   Coagulation profile No results for input(s): INR, PROTIME in the last 168 hours. ------------------------------------------------------------------------------------------------------------------- No results for input(s): DDIMER in the last 72 hours. -------------------------------------------------------------------------------------------------------------------  Cardiac Enzymes  Recent Labs Lab 09/06/15 2302  TROPONINI <0.03   ------------------------------------------------------------------------------------------------------------------ Invalid input(s): POCBNP  ---------------------------------------------------------------------------------------------------------------  Urinalysis    Component Value Date/Time   COLORURINE Yellow 09/30/2013 2333   APPEARANCEUR Clear 09/30/2013 2333   LABSPEC 1.011 09/30/2013 2333   PHURINE 7.0 09/30/2013 2333   GLUCOSEU Negative 09/30/2013 2333   HGBUR Negative 09/30/2013 2333   BILIRUBINUR Negative 09/30/2013 2333   KETONESUR Negative 09/30/2013 2333   PROTEINUR Negative 09/30/2013  2333   NITRITE Negative 09/30/2013 2333   LEUKOCYTESUR Negative 09/30/2013 2333     RADIOLOGY: Ct Angio Chest Pe W/cm &/or Wo Cm  09/07/2015  CLINICAL DATA:  50 year old female with dyspnea and chest pain. EXAM: CT ANGIOGRAPHY CHEST WITH CONTRAST TECHNIQUE: Multidetector CT imaging of the chest was performed using the standard protocol during bolus administration of intravenous contrast. Multiplanar CT image reconstructions and MIPs were obtained to evaluate the vascular anatomy. CONTRAST:  100mL OMNIPAQUE IOHEXOL 350 MG/ML SOLN COMPARISON:  Radiograph dated 09/06/2015 FINDINGS: The lungs are clear. No pleural effusion or pneumothorax. The central airways are patent. The thoracic aorta appears unremarkable. No CT evidence of pulmonary embolism. No cardiomegaly or pericardial effusion. There is no hilar or mediastinal adenopathy. The esophagus appears grossly unremarkable. There is no axillary adenopathy. The chest wall soft tissues appear unremarkable. The osseous structures are intact. The visualized upper abdomen is unremarkable. Review of the MIP images confirms the above findings. IMPRESSION: No CT evidence of pulmonary embolism. Electronically Signed   By: Elgie CollardArash  Radparvar M.D.   On: 09/07/2015 00:02   Dg Chest Portable 1 View  09/06/2015  CLINICAL DATA:  50 year old female with left-sided chest pain EXAM: PORTABLE CHEST 1 VIEW COMPARISON:  Radiograph dated 05/25/2015 FINDINGS: The heart size and mediastinal contours are within normal limits. Both lungs are clear. The visualized skeletal structures are unremarkable. IMPRESSION: No active disease. Electronically Signed   By: Elgie CollardArash  Radparvar M.D.   On: 09/06/2015 23:30    EKG: Orders placed or performed during the hospital encounter of 09/06/15  . ED EKG  . ED EKG  . EKG 12-Lead  . EKG 12-Lead  . EKG 12-Lead  . EKG 12-Lead  . ED EKG within 10 minutes  . ED EKG within 10 minutes  IMPRESSION AND PLAN: 50 year old female patient with  history of thyroid disease, GERD presented to the emergency room with chest pain  Admitting diagnosis #1 chest pain #2 hypokalemia #3 GERD #4 thyroid disease Treatment plan Admit patient to telemetry under observation bed Aspirin 81 mg daily Cycle troponins to rule out ischemia check echocardiogram Consultation with cardiology Subcutaneous Lovenox for DVT prophylaxis Potassium replacement.  All the records are reviewed and case discussed with ED provider. Management plans discussed with the patient, family and they are in agreement.  CODE STATUS:FULL Code Status History    This patient does not have a recorded code status. Please follow your organizational policy for patients in this situation.       TOTAL TIME TAKING CARE OF THIS PATIENT : 50 minutes.    Ihor Austin M.D on 09/07/2015 at 2:03 AM  Between 7am to 6pm - Pager - [phone]  After 6pm go to [email or web address] - password [email or web address] [facility]  [facility]  Office  [phone]  CC: Primary care physician; [facility]

## 2015-09-07 NOTE — Discharge Summary (Signed)
[facility] - [place] at [facility]   PATIENT NAME: Morgan Stevens    MR#:  [id number]  DATE OF BIRTH:  [birth year]  DATE OF ADMISSION:  09/06/2015 ADMITTING PHYSICIAN: Ihor Austin, MD  DATE OF DISCHARGE: 09/07/2015  PRIMARY CARE PHYSICIAN: [facility]    ADMISSION DIAGNOSIS:  Tachypnea [R06.82] Dyspnea [R06.00] Chest pain [R07.9]  DISCHARGE DIAGNOSIS:  Principal Problem:   Chest pain   SECONDARY DIAGNOSIS:   Past Medical History  Diagnosis Date  . Thyroid disease   . Reflux     HOSPITAL COURSE:   50 year old female presenting for chest pain.  1. Chest pain, atypical: Patient symptoms were atypical in nature. Troponins were negative. Cardiology was consulted. She will follow-up with cardiology as outpatient on Friday for stress test. She had no evidence of acute cardiac syndrome.  2. Hypothyroid: Continue Synthroid   DISCHARGE CONDITIONS AND DIET:  Patient stable for discharge on regular diet  CONSULTS OBTAINED:  Treatment Team:  Alwyn Pea, MD  DRUG ALLERGIES:  No Known Allergies  DISCHARGE MEDICATIONS:   Current Discharge Medication List    CONTINUE these medications which have NOT CHANGED   Details  cyclobenzaprine (FLEXERIL) 5 MG tablet Take 1 tablet (5 mg total) by mouth 3 (three) times daily as needed for muscle spasms. Qty: 15 tablet, Refills: 0    levothyroxine (SYNTHROID, LEVOTHROID) 137 MCG tablet Take 137 mcg by mouth daily before breakfast.      STOP taking these medications     ibuprofen (ADVIL,MOTRIN) 800 MG tablet      naproxen (EC NAPROSYN) 500 MG EC tablet               Today   CHIEF COMPLAINT:  No chest pain overnight. Patient is doing well.   VITAL SIGNS:  Blood pressure 115/71, pulse 68, temperature 98.1 F (36.7 C), temperature source Oral, resp. rate 18, height 5\' 4"  (1.626 m), weight 102.059 kg (225 lb), last menstrual period 08/20/2015, SpO2 100  %.   REVIEW OF SYSTEMS:  Review of Systems  Constitutional: Negative for fever, chills and malaise/fatigue.  HENT: Negative for sore throat.   Eyes: Negative for blurred vision.  Respiratory: Negative for cough, hemoptysis, shortness of breath and wheezing.   Cardiovascular: Negative for chest pain, palpitations and leg swelling.  Gastrointestinal: Negative for nausea, vomiting, abdominal pain, diarrhea and blood in stool.  Genitourinary: Negative for dysuria.  Musculoskeletal: Negative for back pain.  Neurological: Negative for dizziness, tremors and headaches.  Endo/Heme/Allergies: Does not bruise/bleed easily.     PHYSICAL EXAMINATION:  GENERAL:  50 y.o.-year-old patient lying in the bed with no acute distress.  NECK:  Supple, no jugular venous distention. No thyroid enlargement, no tenderness.  LUNGS: Normal breath sounds bilaterally, no wheezing, rales,rhonchi  No use of accessory muscles of respiration.  CARDIOVASCULAR: S1, S2 normal. No murmurs, rubs, or gallops.  ABDOMEN: Soft, non-tender, non-distended. Bowel sounds present. No organomegaly or mass.  EXTREMITIES: No pedal edema, cyanosis, or clubbing.  PSYCHIATRIC: The patient is alert and oriented x 3.  SKIN: No obvious rash, lesion, or ulcer.   DATA REVIEW:   CBC  Recent Labs Lab 09/06/15 2302  WBC 7.8  HGB 12.9  HCT 37.7  PLT 292    Chemistries   Recent Labs Lab 09/06/15 2302  NA 138  K 3.3*  CL 105  CO2 25  GLUCOSE 135*  BUN 9  CREATININE 0.70  CALCIUM 9.3    Cardiac Enzymes  Recent Labs Lab 09/06/15 2302 09/07/15 0417  TROPONINI <0.03 <0.03    Microbiology Results  @[email or web address] @  RADIOLOGY:  Ct Angio Chest Pe W/cm &/or Wo Cm  09/07/2015  CLINICAL DATA:  50 year old female with dyspnea and chest pain. EXAM: CT ANGIOGRAPHY CHEST WITH CONTRAST TECHNIQUE: Multidetector CT imaging of the chest was performed using the standard protocol during bolus administration of intravenous contrast.  Multiplanar CT image reconstructions and MIPs were obtained to evaluate the vascular anatomy. CONTRAST:  100mL OMNIPAQUE IOHEXOL 350 MG/ML SOLN COMPARISON:  Radiograph dated 09/06/2015 FINDINGS: The lungs are clear. No pleural effusion or pneumothorax. The central airways are patent. The thoracic aorta appears unremarkable. No CT evidence of pulmonary embolism. No cardiomegaly or pericardial effusion. There is no hilar or mediastinal adenopathy. The esophagus appears grossly unremarkable. There is no axillary adenopathy. The chest wall soft tissues appear unremarkable. The osseous structures are intact. The visualized upper abdomen is unremarkable. Review of the MIP images confirms the above findings. IMPRESSION: No CT evidence of pulmonary embolism. Electronically Signed   By: Elgie CollardArash  Radparvar M.D.   On: 09/07/2015 00:02   Dg Chest Portable 1 View  09/06/2015  CLINICAL DATA:  50 year old female with left-sided chest pain EXAM: PORTABLE CHEST 1 VIEW COMPARISON:  Radiograph dated 05/25/2015 FINDINGS: The heart size and mediastinal contours are within normal limits. Both lungs are clear. The visualized skeletal structures are unremarkable. IMPRESSION: No active disease. Electronically Signed   By: Elgie CollardArash  Radparvar M.D.   On: 09/06/2015 23:30      Management plans discussed with the patient and she is in agreement. Stable for discharge   Patient should follow up with cardiology in 1 week and PCP in one week  CODE STATUS:     Code Status Orders        Start     Ordered   09/07/15 0619  Full code   Continuous     09/07/15 0619    Code Status History    Date Active Date Inactive Code Status Order ID Comments User Context   This patient has a current code status but no historical code status.      TOTAL TIME TAKING CARE OF THIS PATIENT: 35 minutes.    Note: This dictation was prepared with Dragon dictation along with smaller phrase technology. Any transcriptional errors that result from  this process are unintentional.  Daenerys Buttram M.D on 09/07/2015 at 9:43 AM  Between 7am to 6pm - Pager - [phone] After 6pm go to [email or web address] - password [email or web address] [email or web address]  [facility]  Office  [phone]  CC: Primary care physician; [facility]

## 2015-09-07 NOTE — ED Notes (Signed)
Pt resting comfortably with eyes closed

## 2015-09-07 NOTE — Consult Note (Signed)
Reason for Consult chest pain shortness of breath angina Referring Physician:   [facility], [facility]  Morgan Stevens is an 49 y.o. female.  HPI:  50 year old female history of thyroid disease recent gallbladder surgery mild obesity who the states that she started having midsternal chest pain radiating to her neck. Patient denies blackout spells of syncope complains of weakness shortness of breath fatigue came emergency room for evaluation because of 10/10 chest pain EKG was unremarkable troponins were negative cardiology was recommended for follow-up evaluation. Patient is scheduled for possible functional study but unable to have it done in the hospital so she is being arranged to have it done as an outpatient. Patient denies previous cardiac history pain is somewhat improved now she is lying in bed. Denies smoking  Past Medical History  Diagnosis Date  . Thyroid disease   . Reflux     Past Surgical History  Procedure Laterality Date  . Thyroidectomy    . Cholecystectomy      No family history on file.  Social History:  reports that she has never smoked. She does not have any smokeless tobacco history on file. She reports that she does not drink alcohol or use illicit drugs.  Allergies: No Known Allergies  Medications: I have reviewed the patient's current medications.  Results for orders placed or performed during the hospital encounter of 09/06/15 (from the past 48 hour(s))  Basic metabolic panel     Status: Abnormal   Collection Time: 09/06/15 11:02 PM  Result Value Ref Range   Sodium 138 135 - 145 mmol/L   Potassium 3.3 (L) 3.5 - 5.1 mmol/L   Chloride 105 101 - 111 mmol/L   CO2 25 22 - 32 mmol/L   Glucose, Bld 135 (H) 65 - 99 mg/dL   BUN 9 6 - 20 mg/dL   Creatinine, Ser 0.70 0.44 - 1.00 mg/dL   Calcium 9.3 8.9 - 10.3 mg/dL   GFR calc non Af Amer >60 >60 mL/min   GFR calc Af Amer >60 >60 mL/min    Comment: (NOTE) The eGFR has been  calculated using the CKD EPI equation. This calculation has not been validated in all clinical situations. eGFR's persistently <60 mL/min signify possible Chronic Kidney Disease.    Anion gap 8 5 - 15  CBC     Status: None   Collection Time: 09/06/15 11:02 PM  Result Value Ref Range   WBC 7.8 3.6 - 11.0 K/uL   RBC 4.33 3.80 - 5.20 MIL/uL   Hemoglobin 12.9 12.0 - 16.0 g/dL   HCT 37.7 35.0 - 47.0 %   MCV 87.1 80.0 - 100.0 fL   MCH 29.7 26.0 - 34.0 pg   MCHC 34.2 32.0 - 36.0 g/dL   RDW 13.7 11.5 - 14.5 %   Platelets 292 150 - 440 K/uL  Troponin I     Status: None   Collection Time: 09/06/15 11:02 PM  Result Value Ref Range   Troponin I <0.03 <0.031 ng/mL    Comment:        NO INDICATION OF MYOCARDIAL INJURY.   Lipid panel     Status: None   Collection Time: 09/06/15 11:02 PM  Result Value Ref Range   Cholesterol 160 0 - 200 mg/dL   Triglycerides 103 <150 mg/dL   HDL 41 >40 mg/dL   Total CHOL/HDL Ratio 3.9 RATIO   VLDL 21 0 - 40 mg/dL   LDL Cholesterol 98 0 - 99  mg/dL    Comment:        Total Cholesterol/HDL:CHD Risk Coronary Heart Disease Risk Table                     Men   Women  1/2 Average Risk   3.4   3.3  Average Risk       5.0   4.4  2 X Average Risk   9.6   7.1  3 X Average Risk  23.4   11.0        Use the calculated Patient Ratio above and the CHD Risk Table to determine the patient's CHD Risk.        ATP III CLASSIFICATION (LDL):  <100     mg/dL   Optimal  100-129  mg/dL   Near or Above                    Optimal  130-159  mg/dL   Borderline  160-189  mg/dL   High  >190     mg/dL   Very High   Troponin I     Status: None   Collection Time: 09/07/15  4:17 AM  Result Value Ref Range   Troponin I <0.03 <0.031 ng/mL    Comment:        NO INDICATION OF MYOCARDIAL INJURY.   Troponin I     Status: None   Collection Time: 09/07/15 12:46 PM  Result Value Ref Range   Troponin I <0.03 <0.031 ng/mL    Comment:        NO INDICATION OF MYOCARDIAL  INJURY.     Ct Angio Chest Pe W/cm &/or Wo Cm  09/07/2015  CLINICAL DATA:  50 year old female with dyspnea and chest pain. EXAM: CT ANGIOGRAPHY CHEST WITH CONTRAST TECHNIQUE: Multidetector CT imaging of the chest was performed using the standard protocol during bolus administration of intravenous contrast. Multiplanar CT image reconstructions and MIPs were obtained to evaluate the vascular anatomy. CONTRAST:  191m OMNIPAQUE IOHEXOL 350 MG/ML SOLN COMPARISON:  Radiograph dated 09/06/2015 FINDINGS: The lungs are clear. No pleural effusion or pneumothorax. The central airways are patent. The thoracic aorta appears unremarkable. No CT evidence of pulmonary embolism. No cardiomegaly or pericardial effusion. There is no hilar or mediastinal adenopathy. The esophagus appears grossly unremarkable. There is no axillary adenopathy. The chest wall soft tissues appear unremarkable. The osseous structures are intact. The visualized upper abdomen is unremarkable. Review of the MIP images confirms the above findings. IMPRESSION: No CT evidence of pulmonary embolism. Electronically Signed   By: AAnner CreteM.D.   On: 09/07/2015 00:02   Dg Chest Portable 1 View  09/06/2015  CLINICAL DATA:  50year old female with left-sided chest pain EXAM: PORTABLE CHEST 1 VIEW COMPARISON:  Radiograph dated 05/25/2015 FINDINGS: The heart size and mediastinal contours are within normal limits. Both lungs are clear. The visualized skeletal structures are unremarkable. IMPRESSION: No active disease. Electronically Signed   By: AAnner CreteM.D.   On: 09/06/2015 23:30    Review of Systems  Constitutional: Positive for malaise/fatigue.  HENT: Negative.   Eyes: Negative.   Respiratory: Positive for shortness of breath.   Cardiovascular: Positive for chest pain, palpitations and leg swelling.  Gastrointestinal: Positive for heartburn.  Genitourinary: Negative.   Musculoskeletal: Negative.   Skin: Negative.   Neurological:  Positive for weakness.  Endo/Heme/Allergies: Negative.   Psychiatric/Behavioral: Negative.    Blood pressure 129/69, pulse 83, temperature 98 F (36.7 C), temperature  source Oral, resp. rate 19, height 5' 4"  (1.626 m), weight 102.059 kg (225 lb), last menstrual period 08/20/2015, SpO2 98 %. Physical Exam  Nursing note and vitals reviewed. Constitutional: She is oriented to person, place, and time. She appears well-developed and well-nourished.  HENT:  Head: Normocephalic and atraumatic.  Eyes: Conjunctivae and EOM are normal. Pupils are equal, round, and reactive to light.  Neck: Normal range of motion. Neck supple.  Cardiovascular: Normal rate and regular rhythm.   Respiratory: Effort normal and breath sounds normal.  GI: Soft. Bowel sounds are normal.  Musculoskeletal: Normal range of motion.  Neurological: She is alert and oriented to person, place, and time. She has normal reflexes.  Skin: Skin is warm.  Psychiatric: She has a normal mood and affect.    Assessment/Plan: Shortness of breath  angina  obesity  hypokalemia  thyroid disease  GERD . PLAN  agree with overnight admission for rule out myocardial infarction  agree with telemetry  rule out myocardial infarction  follow-up EKG  aspirin for arteriosclerotic vascular disease  GERD recommend  Protonix  continue levothyroxine for thyroid disease  recommend echocardiogram for further assessment shortness of breath mild murmur  recommend exercise Myoview for evaluation of possible angina  studies can be probably done as an outpatient  outpatient follow-up with Cardiology in 1-2 weeks  Raylen Ken D. 09/07/2015, 2:26 PM

## 2015-09-07 NOTE — ED Notes (Signed)
Report called to RN, pt admitted to 114.

## 2015-10-24 ENCOUNTER — Ambulatory Visit: Payer: Self-pay | Admitting: Cardiovascular Disease

## 2015-10-24 ENCOUNTER — Encounter: Payer: Self-pay | Admitting: *Deleted

## 2015-10-26 DIAGNOSIS — G44209 Tension-type headache, unspecified, not intractable: Secondary | ICD-10-CM | POA: Insufficient documentation

## 2015-10-26 DIAGNOSIS — Z79899 Other long term (current) drug therapy: Secondary | ICD-10-CM | POA: Insufficient documentation

## 2015-10-27 ENCOUNTER — Emergency Department: Payer: Self-pay

## 2015-10-27 ENCOUNTER — Encounter: Payer: Self-pay | Admitting: Emergency Medicine

## 2015-10-27 ENCOUNTER — Emergency Department
Admission: EM | Admit: 2015-10-27 | Discharge: 2015-10-27 | Disposition: A | Discharge status: Home / Self Care | Payer: Self-pay | Attending: Emergency Medicine | Admitting: Emergency Medicine

## 2015-10-27 DIAGNOSIS — G44209 Tension-type headache, unspecified, not intractable: Secondary | ICD-10-CM

## 2015-10-27 IMAGING — CT CT HEAD W/O CM
1 of 2 series · 13 of 30 positions shown, 17 images · non-contrast
Comparison: CT head [DATE]

CLINICAL DATA: RIGHT posterior headache beginning tonight.
Dizziness. History of thyroid disease.

EXAM:
CT HEAD WITHOUT CONTRAST
TECHNIQUE: Contiguous axial images were obtained from the base of the skull
through the vertex without intravenous contrast.

[Series 4: soft tissue recon · axial · 0.41mm/px · z∈[+429,+554]mm · 13 of 31 slices shown, 17 images]
[im 3/31  brain]
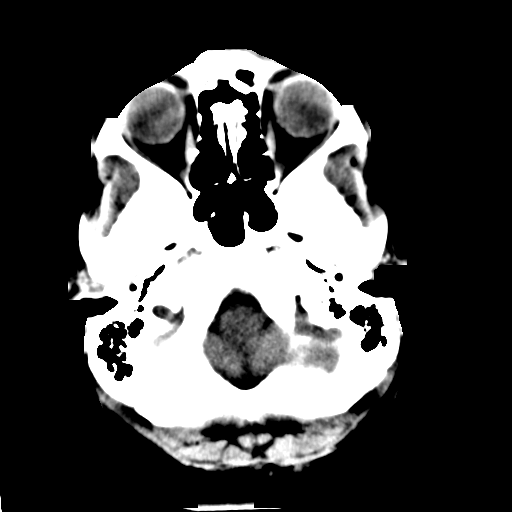
[im 3/31  bone]
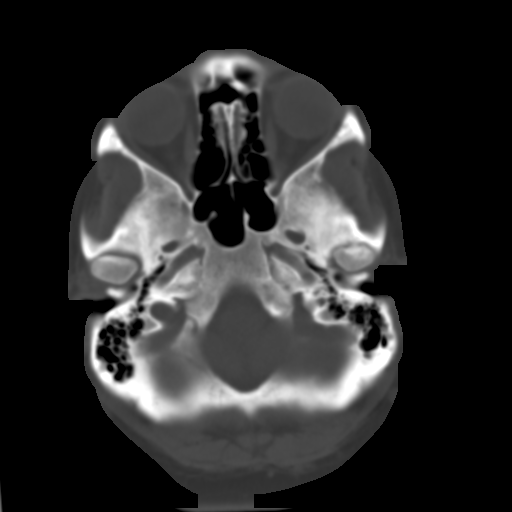
[im 5/31  brain]
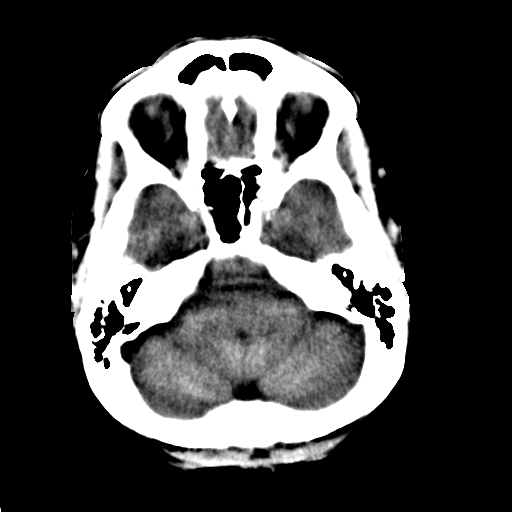
[im 7/31  brain]
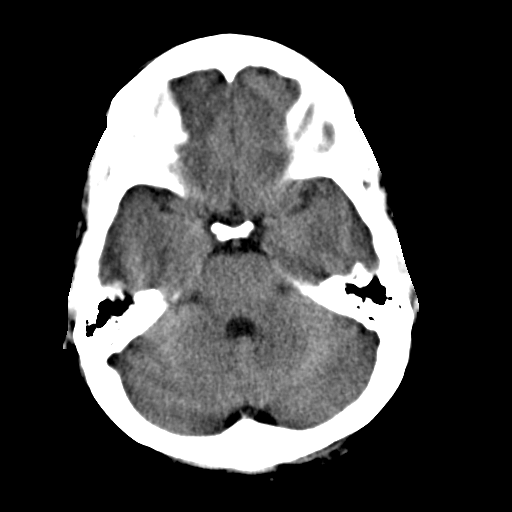
[im 9/31  brain]
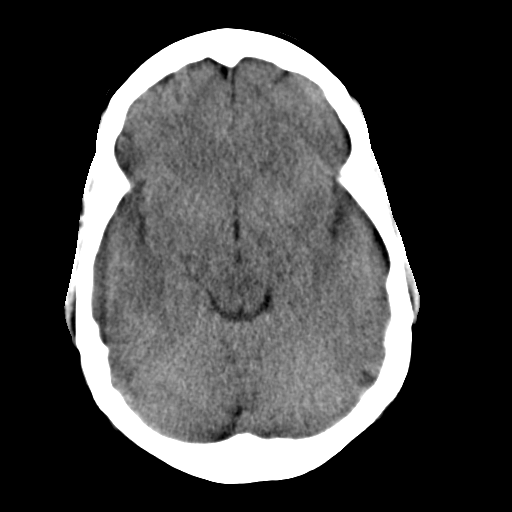
[im 11/31  brain]
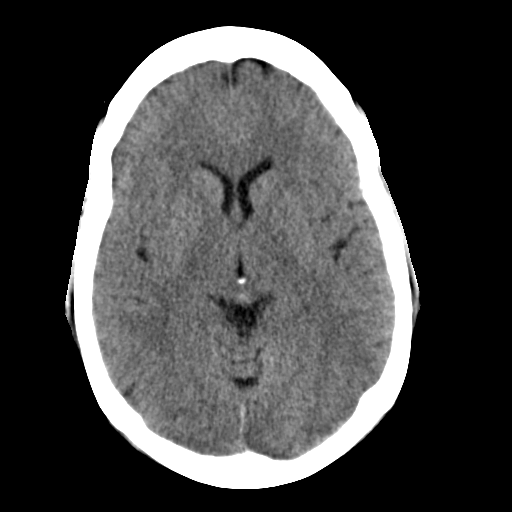
[im 11/31  bone]
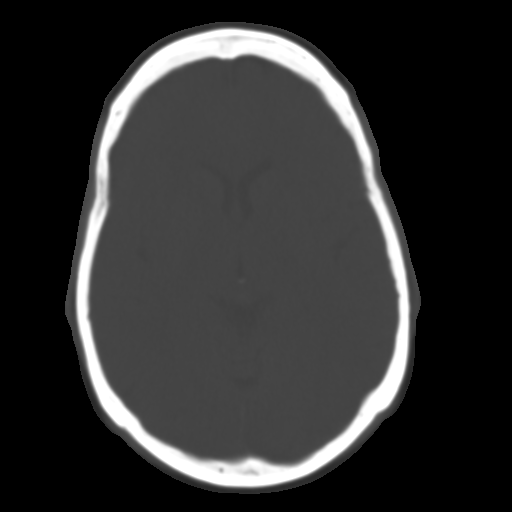
[im 13/31  brain]
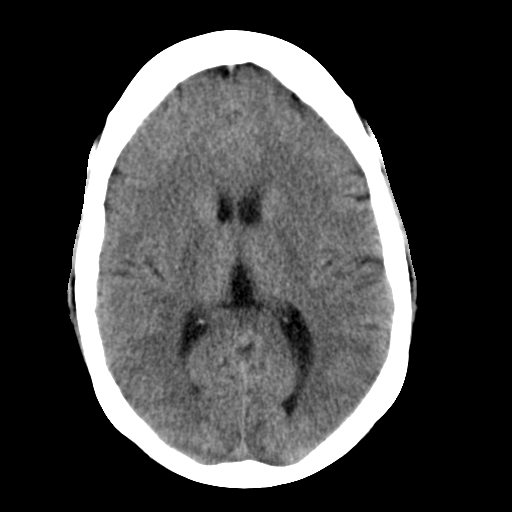
[im 16/31  brain]
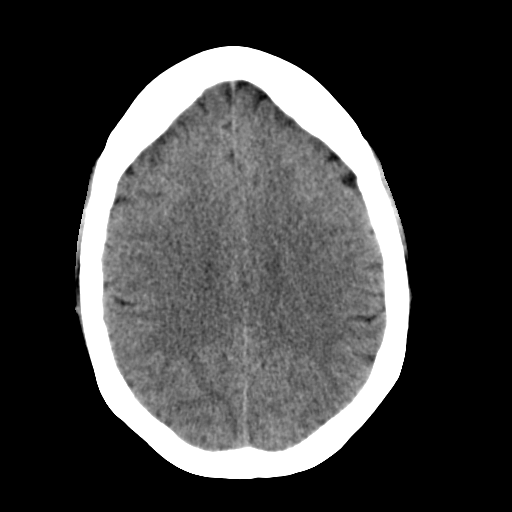
[im 18/31  brain]
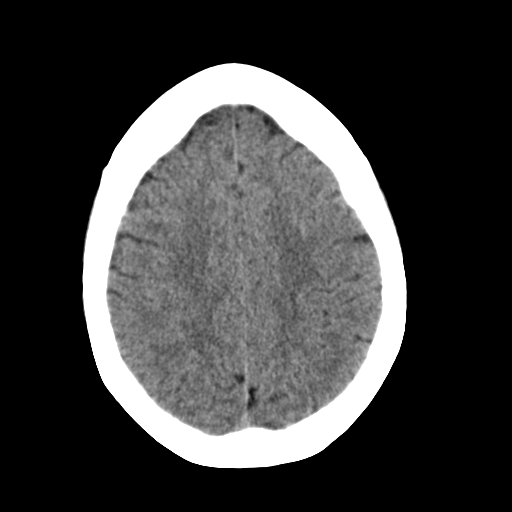
[im 20/31  brain]
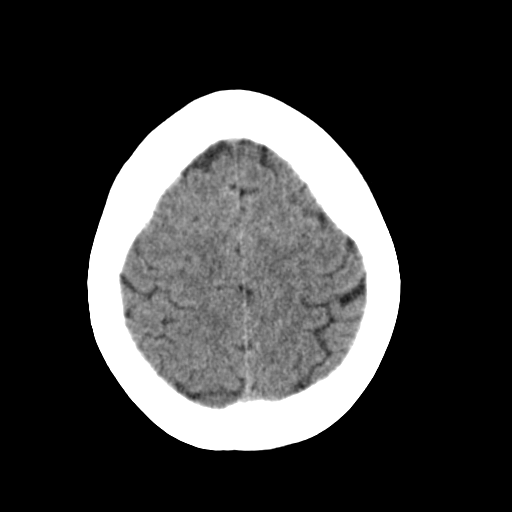
[im 20/31  bone]
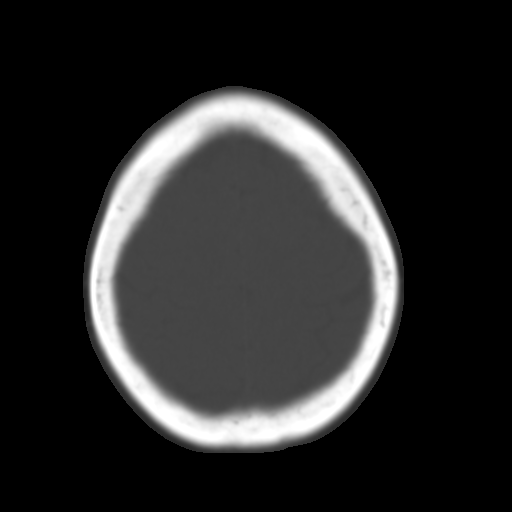
[im 22/31  brain]
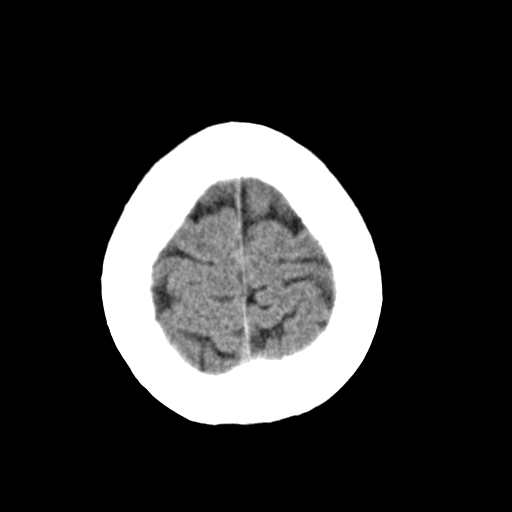
[im 24/31  brain]
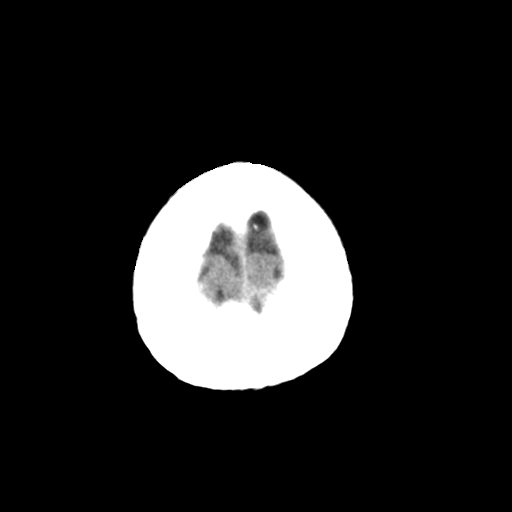
[im 26/31  brain]
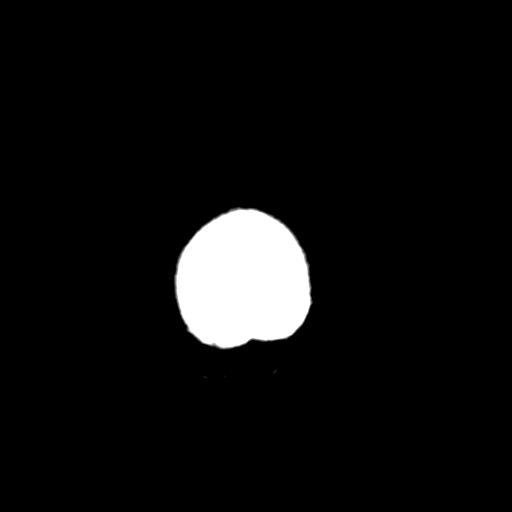
[im 28/31  brain]
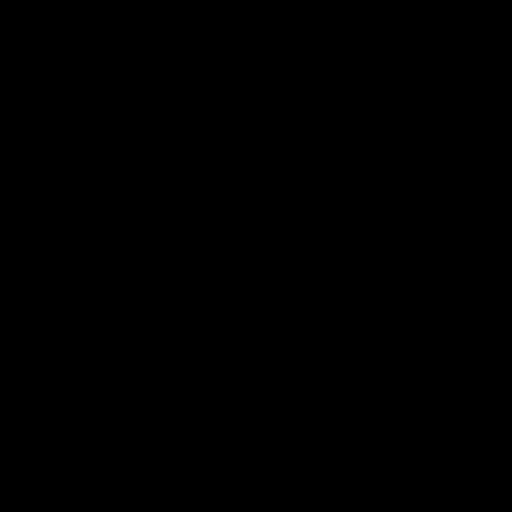
[im 28/31  bone]
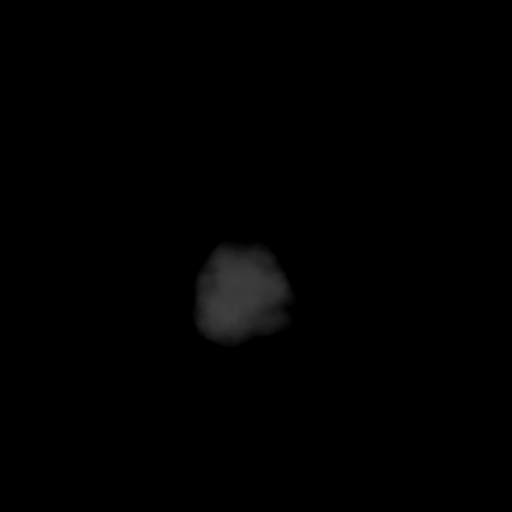

[13 of 30 positions shown; findings below may reference images not displayed]

FINDINGS: The ventricles and sulci are normal. No intraparenchymal hemorrhage,
mass effect nor midline shift. No acute large vascular territory
infarcts.

No abnormal extra-axial fluid collections. Basal cisterns are
patent.

No skull fracture. The included ocular globes and orbital contents
are non-suspicious. The mastoid aircells and included paranasal
sinuses are well-aerated.
IMPRESSION: Normal noncontrast CT head.

## 2015-10-27 MED ORDER — HYDROCODONE-ACETAMINOPHEN 5-325 MG PO TABS
1.0000 | ORAL_TABLET | Freq: Once | ORAL | Status: AC
  Administered 2015-10-27: 1 via ORAL
  Filled 2015-10-27: qty 1

## 2015-10-27 MED ORDER — HYDROCODONE-ACETAMINOPHEN 5-325 MG PO TABS: 1.0000 | ORAL_TABLET | Freq: Four times a day (QID) | ORAL | Status: DC | PRN

## 2015-10-27 NOTE — ED Notes (Signed)
Dr. Quigley at bedside.  

## 2015-10-27 NOTE — Discharge Instructions (Signed)
General Headache Without Cause A headache is pain or discomfort felt around the head or neck area. The specific cause of a headache may not be found. There are many causes and types of headaches. A few common ones are:  Tension headaches.  Migraine headaches.  Cluster headaches.  Chronic daily headaches. HOME CARE INSTRUCTIONS  Watch your condition for any changes. Take these steps to help with your condition: Managing Pain  Take over-the-counter and prescription medicines only as told by your health care provider.  Lie down in a dark, quiet room when you have a headache.  If directed, apply ice to the head and neck area:  Put ice in a plastic bag.  Place a towel between your skin and the bag.  Leave the ice on for 20 minutes, 2-3 times per day.  Use a heating pad or hot shower to apply heat to the head and neck area as told by your health care provider.  Keep lights dim if bright lights bother you or make your headaches worse. Eating and Drinking  Eat meals on a regular schedule.  Limit alcohol use.  Decrease the amount of caffeine you drink, or stop drinking caffeine. General Instructions  Keep all follow-up visits as told by your health care provider. This is important.  Keep a headache journal to help find out what may trigger your headaches. For example, write down:  What you eat and drink.  How much sleep you get.  Any change to your diet or medicines.  Try massage or other relaxation techniques.  Limit stress.  Sit up straight, and do not tense your muscles.  Do not use tobacco products, including cigarettes, chewing tobacco, or e-cigarettes. If you need help quitting, ask your health care provider.  Exercise regularly as told by your health care provider.  Sleep on a regular schedule. Get 7-9 hours of sleep, or the amount recommended by your health care provider. SEEK MEDICAL CARE IF:   Your symptoms are not helped by medicine.  You have a  headache that is different from the usual headache.  You have nausea or you vomit.  You have a fever. SEEK IMMEDIATE MEDICAL CARE IF:   Your headache becomes severe.  You have repeated vomiting.  You have a stiff neck.  You have a loss of vision.  You have problems with speech.  You have pain in the eye or ear.  You have muscular weakness or loss of muscle control.  You lose your balance or have trouble walking.  You feel faint or pass out.  You have confusion.   This information is not intended to replace advice given to you by your health care provider. Make sure you discuss any questions you have with your health care provider.   Document Released: 08/13/2005 Document Revised: 05/04/2015 Document Reviewed: 12/06/2014 Elsevier Interactive Patient Education 2016 [employer] Inc[employer]  Tension Headache A tension headache is a feeling of pain, pressure, or aching that is often felt over the front and sides of the head. The pain can be dull, or it can feel tight (constricting). Tension headaches are not normally associated with nausea or vomiting, and they do not get worse with physical activity. Tension headaches can last from 30 minutes to several days. This is the most common type of headache. CAUSES The exact cause of this condition is not known. Tension headaches often begin after stress, anxiety, or depression. Other triggers may include:  Alcohol.  Too much caffeine, or caffeine withdrawal.  Respiratory  infections, such as colds, flu, or sinus infections.  Dental problems or teeth clenching.  Fatigue.  Holding your head and neck in the same position for a long period of time, such as while using a computer.  Smoking. SYMPTOMS Symptoms of this condition include:  A feeling of pressure around the head.  Dull, aching head pain.  Pain felt over the front and sides of the head.  Tenderness in the muscles of the head, neck, and shoulders. DIAGNOSIS This condition  may be diagnosed based on your symptoms and a physical exam. Tests may be done, such as a CT scan or an MRI of your head. These tests may be done if your symptoms are severe or unusual. TREATMENT This condition may be treated with lifestyle changes and medicines to help relieve symptoms. HOME CARE INSTRUCTIONS Managing Pain  Take over-the-counter and prescription medicines only as told by your health care provider.  Lie down in a dark, quiet room when you have a headache.  If directed, apply ice to the head and neck area:  Put ice in a plastic bag.  Place a towel between your skin and the bag.  Leave the ice on for 20 minutes, 2-3 times per day.  Use a heating pad or a hot shower to apply heat to the head and neck area as told by your health care provider. Eating and Drinking  Eat meals on a regular schedule.  Limit alcohol use.  Decrease your caffeine intake, or stop using caffeine. General Instructions  Keep all follow-up visits as told by your health care provider. This is important.  Keep a headache journal to help find out what may trigger your headaches. For example, write down:  What you eat and drink.  How much sleep you get.  Any change to your diet or medicines.  Try massage or other relaxation techniques.  Limit stress.  Sit up straight, and avoid tensing your muscles.  Do not use tobacco products, including cigarettes, chewing tobacco, or e-cigarettes. If you need help quitting, ask your health care provider.  Exercise regularly as told by your health care provider.  Get 7-9 hours of sleep, or the amount recommended by your health care provider. SEEK MEDICAL CARE IF:  Your symptoms are not helped by medicine.  You have a headache that is different from what you normally experience.  You have nausea or you vomit.  You have a fever. SEEK IMMEDIATE MEDICAL CARE IF:  Your headache becomes severe.  You have repeated vomiting.  You have a stiff  neck.  You have a loss of vision.  You have problems with speech.  You have pain in your eye or ear.  You have muscular weakness or loss of muscle control.  You lose your balance or you have trouble walking.  You feel faint or you pass out.  You have confusion.   This information is not intended to replace advice given to you by your health care provider. Make sure you discuss any questions you have with your health care provider.   Document Released: 08/13/2005 Document Revised: 05/04/2015 Document Reviewed: 12/06/2014 Elsevier Interactive Patient Education [employer]  Please return immediately if condition worsens. Please contact her primary physician or the physician you were given for referral. If you have any specialist physicians involved in her treatment and plan please also contact them. Thank you for using [facility].

## 2015-10-27 NOTE — ED Notes (Signed)
Spoke with Dr Huel Cote regarding pt's cc; no orders given at this time

## 2015-10-27 NOTE — ED Notes (Addendum)
Pt presents with headache on lower to upper right posterior side of head. States movement, light and side do not bother her. Pt states dizziness and nausea but no vomiting. Denies blurred vision. Pt states she took tylenol earlier without relief.

## 2015-10-27 NOTE — ED Notes (Addendum)
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

## 2015-10-27 NOTE — ED Provider Notes (Signed)
Time Seen: Approximately 0145  I have reviewed the triage notes  Chief Complaint: Headache   History of Present Illness: Morgan Stevens is a 50 y.o. female *who describes a headache primarily over the right occipital area and states some pain right side of her neck. She states her headache started approximately 5 hours ago. She denies any head trauma. She denies any focal weakness. She denies any persistent nausea or vomiting. She was concerned concerning is a history of cerebral aneurysmal disease in her family. She denies any fever, chills, blurred vision, loss of vision. Patient states she took some Tylenol at home without significant relief.   Past Medical History  Diagnosis Date  . Thyroid disease   . Reflux     Patient Active Problem List   Diagnosis Date Noted  . Chest pain 09/07/2015    Past Surgical History  Procedure Laterality Date  . Thyroidectomy    . Cholecystectomy      Past Surgical History  Procedure Laterality Date  . Thyroidectomy    . Cholecystectomy      Current Outpatient Rx  Name  Route  Sig  Dispense  Refill  . cyclobenzaprine (FLEXERIL) 5 MG tablet   Oral   Take 1 tablet (5 mg total) by mouth 3 (three) times daily as needed for muscle spasms.   15 tablet   0   . HYDROcodone-acetaminophen (NORCO) 5-325 MG tablet   Oral   Take 1 tablet by mouth every 6 (six) hours as needed for moderate pain.   20 tablet   0   . levothyroxine (SYNTHROID, LEVOTHROID) 137 MCG tablet   Oral   Take 137 mcg by mouth daily before breakfast.           Allergies:  Review of patient's allergies indicates no known allergies.  Family History: No family history on file.  Social History: Social History  Substance Use Topics  . Smoking status: Never Smoker   . Smokeless tobacco: None  . Alcohol Use: No     Review of Systems:   10 point review of systems was performed and was otherwise negative:  Constitutional: No fever Eyes: No visual  disturbances ENT: No sore throat, ear pain Cardiac: No chest pain Respiratory: No shortness of breath, wheezing, or stridor Abdomen: No abdominal pain, no vomiting, No diarrhea Endocrine: No weight loss, No night sweats Extremities: No peripheral edema, cyanosis Skin: No rashes, easy bruising Neurologic: No focal weakness, trouble with speech or swollowing Urologic: No dysuria, Hematuria, or urinary frequency  Physical Exam:  ED Triage Vitals  Enc Vitals Group     BP 10/27/15 0009 142/84 mmHg     Pulse Rate 10/27/15 0009 80     Resp 10/27/15 0009 18     Temp 10/27/15 0009 98.4 F (36.9 C)     Temp Source 10/27/15 0009 Oral     SpO2 10/27/15 0009 100 %     Weight 10/27/15 0009 220 lb (99.791 kg)     Height 10/27/15 0009  (1.626 m)     Head Cir --      Peak Flow --      Pain Score 10/27/15 0018 7     Pain Loc --      Pain Edu? --      Excl. in GC? --     General: Awake , Alert , and Oriented times 3; GCS 15 Head: Patient has a reproducible component to her pain with palpation over the right occipital  area without any visible rashes or lesions. Eyes: Pupils equal , round, reactive to light Nose/Throat: No nasal drainage, patent upper airway without erythema or exudate.  Neck: Supple, no meningeal signs Full range of motion, No anterior adenopathy or palpable thyroid masses. Patient does have reproducible pain again on the right perispinal muscle region Lungs: Clear to ascultation without wheezes , rhonchi, or rales Heart: Regular rate, regular rhythm without murmurs , gallops , or rubs Abdomen: Soft, non tender without rebound, guarding , or rigidity; bowel sounds positive and symmetric in all 4 quadrants. No organomegaly .        Extremities: 2 plus symmetric pulses. No edema, clubbing or cyanosis Neurologic: normal ambulation, Motor symmetric without deficits, sensory intact Skin: warm, dry, no rashes     Radiology:  CLINICAL DATA: RIGHT posterior headache  beginning tonight. Dizziness. History of thyroid disease.  EXAM: CT HEAD WITHOUT CONTRAST  TECHNIQUE: Contiguous axial images were obtained from the base of the skull through the vertex without intravenous contrast.  COMPARISON: CT head February 11, 2014  FINDINGS: The ventricles and sulci are normal. No intraparenchymal hemorrhage, mass effect nor midline shift. No acute large vascular territory infarcts.  No abnormal extra-axial fluid collections. Basal cisterns are patent.  No skull fracture. The included ocular globes and orbital contents are non-suspicious. The mastoid aircells and included paranasal sinuses are well-aerated.  IMPRESSION: Normal noncontrast CT head.   Electronically Signed By: Awilda Metro M.D. On: 10/27/2015 03:08      I personally reviewed the radiologic studies   ED Course: * Patient's stay here showed some symptomatic improvement with oral Norco. Differential diagnosis includes but is not exclusive to subarachnoid hemorrhage, meningitis, encephalitis, previous head trauma, cavernous venous thrombosis, muscle tension headache, temporal arteritis, migraine or migraine equivalent, etc. Given the patient's current clinical presentation and objective findings I felt this was not likely to be a life-threatening cause for her headache. Given her headache started 5 hours prior to arrival with a negative head CT this is unlikely to be a subarachnoid hemorrhage. She also does not have any significant photophobia, meningeal signs, persistent vomiting, altered mental status, or any other indications of an aneurysm. We discussed lumbar puncture and all parties feel was not necessary at this time. Given the reproducible nature of her pain I felt this was either occipital neuralgia versus a muscle tension headache. Patient be prescribed some general pain medication was referred back to her primary physician.    Assessment:  Acute unspecified  cephalgia   Final Clinical Impression:  Final diagnoses:  Tension-type headache, not intractable, unspecified chronicity pattern     Plan:  Outpatient management Patient was advised to return immediately if condition worsens. Patient was advised to follow up with their primary care physician or other specialized physicians involved in their outpatient care            Jennye Moccasin, MD 10/27/15 [phone]

## 2015-10-27 NOTE — ED Notes (Signed)
Pt transported to CT via stretcher.  

## 2016-01-20 ENCOUNTER — Emergency Department
Admission: EM | Admit: 2016-01-20 | Discharge: 2016-01-20 | Disposition: A | Discharge status: Home / Self Care | Payer: Self-pay | Attending: Emergency Medicine | Admitting: Emergency Medicine

## 2016-01-20 ENCOUNTER — Encounter: Payer: Self-pay | Admitting: Emergency Medicine

## 2016-01-20 DIAGNOSIS — M7918 Myalgia, other site: Secondary | ICD-10-CM

## 2016-01-20 DIAGNOSIS — M791 Myalgia: Secondary | ICD-10-CM | POA: Insufficient documentation

## 2016-01-20 LAB — URINALYSIS COMPLETE WITH MICROSCOPIC (ARMC ONLY)
Bilirubin Urine: NEGATIVE
Glucose, UA: NEGATIVE mg/dL
Hgb urine dipstick: NEGATIVE
Ketones, ur: NEGATIVE mg/dL
Leukocytes, UA: NEGATIVE
Nitrite: NEGATIVE
Protein, ur: NEGATIVE mg/dL
RBC / HPF: NONE SEEN RBC/hpf (ref 0–5)
Specific Gravity, Urine: 1.003 — ABNORMAL LOW (ref 1.005–1.030)
pH: 6 (ref 5.0–8.0)

## 2016-01-20 MED ORDER — NAPROXEN 500 MG PO TABS: 500.0000 mg | ORAL_TABLET | Freq: Two times a day (BID) | ORAL | Status: DC

## 2016-01-20 MED ORDER — CYCLOBENZAPRINE HCL 10 MG PO TABS: 10.0000 mg | ORAL_TABLET | Freq: Three times a day (TID) | ORAL | Status: DC | PRN

## 2016-01-20 NOTE — ED Provider Notes (Signed)
[facility] Emergency Department Provider Note ____________________________________________  Time seen: Approximately 10:17 AM  I have reviewed the triage vital signs and the nursing notes.   HISTORY  Chief Complaint Back Pain    HPI Morgan Stevens is a 50 y.o. female who presents to the emergency department for evaluation of lower back pain. Pain awakened her this morning. She denies recent illness or injury. She has not taken any medication to relieve the pain. She denies similar symptoms in the past.   Past Medical History  Diagnosis Date  . Thyroid disease   . Reflux     Patient Active Problem List   Diagnosis Date Noted  . Chest pain 09/07/2015    Past Surgical History  Procedure Laterality Date  . Thyroidectomy    . Cholecystectomy      Current Outpatient Rx  Name  Route  Sig  Dispense  Refill  . cyclobenzaprine (FLEXERIL) 10 MG tablet   Oral   Take 1 tablet (10 mg total) by mouth 3 (three) times daily as needed for muscle spasms.   30 tablet   0   . levothyroxine (SYNTHROID, LEVOTHROID) 137 MCG tablet   Oral   Take 137 mcg by mouth daily before breakfast.         . naproxen (NAPROSYN) 500 MG tablet   Oral   Take 1 tablet (500 mg total) by mouth 2 (two) times daily with a meal.   60 tablet   0     Allergies Review of patient's allergies indicates no known allergies.  No family history on file.  Social History Social History  Substance Use Topics  . Smoking status: Never Smoker   . Smokeless tobacco: None  . Alcohol Use: No    Review of Systems Constitutional: No recent illness. Cardiovascular: Denies chest pain or palpitations. Respiratory: Denies shortness of breath. Musculoskeletal: Pain in lower back. Skin: Negative for rash, wound, lesion. Neurological: Negative for focal weakness or numbness.  ____________________________________________   PHYSICAL EXAM:   VITAL SIGNS:   120/65; 74; 18; 99%; 98.4 ED  Triage Vitals  Enc Vitals Group     BP --      Pulse --      Resp --      Temp --      Temp src --      SpO2 --      Weight --      Height --      Head Cir --      Peak Flow --      Pain Score 01/20/16 1006 9     Pain Loc --      Pain Edu? --      Excl. in GC? --     Constitutional: Alert and oriented. Well appearing and in no acute distress. Eyes: Conjunctivae are normal. EOMI. Head: Atraumatic. Neck: No stridor.  Respiratory: Normal respiratory effort.   Musculoskeletal: FROM of back with pain on flexion; pain is in lower thoracic/upper lumbar paraspinal muscles and transverse across. Neurologic:  Normal speech and language. No gross focal neurologic deficits are appreciated. Speech is normal. No gait instability. Skin:  Skin is warm, dry and intact. Atraumatic. Psychiatric: Mood and affect are normal. Speech and behavior are normal.  ____________________________________________   LABS (all labs ordered are listed, but only abnormal results are displayed)  Labs Reviewed  URINALYSIS COMPLETEWITH MICROSCOPIC ([facility] ONLY) - Abnormal; Notable for the following:    Color, Urine STRAW (*)  APPearance CLEAR (*)    Specific Gravity, Urine 1.003 (*)    Bacteria, UA RARE (*)    Squamous Epithelial / LPF 0-5 (*)    All other components within normal limits   ____________________________________________  RADIOLOGY  Not indicated. ____________________________________________   PROCEDURES  Procedure(s) performed: None   ____________________________________________   INITIAL IMPRESSION / ASSESSMENT AND PLAN / ED COURSE  Pertinent labs & imaging results that were available during my care of the patient were reviewed by me and considered in my medical decision making (see chart for details).  Urinalysis negative for blood or infection. No injury reported. Patient was advised to take flexeril and naprosyn then follow up with her PCP for symptoms that are not  improving.  ____________________________________________   FINAL CLINICAL IMPRESSION(S) / ED DIAGNOSES  Final diagnoses:  Musculoskeletal pain       Chinita Pester, FNP 01/21/16 1610  Richardean Canal, MD 01/24/16 [phone]

## 2016-01-20 NOTE — Discharge Instructions (Signed)

## 2016-01-20 NOTE — ED Notes (Signed)
C/o lower back pain, worse with movement.  Ambulates well.

## 2016-01-20 NOTE — ED Notes (Signed)
States she is having mid back pain this am  States pain woke her up.this am  Denies any fever or n/v  But has had slight cough

## 2016-02-25 ENCOUNTER — Observation Stay (HOSPITAL_COMMUNITY)
Admission: EM | Admit: 2016-02-25 | Discharge: 2016-02-27 | Disposition: A | Discharge status: Home / Self Care | Payer: Self-pay | Attending: Family Medicine | Admitting: Family Medicine

## 2016-02-25 ENCOUNTER — Emergency Department (HOSPITAL_COMMUNITY): Payer: Self-pay

## 2016-02-25 ENCOUNTER — Encounter (HOSPITAL_COMMUNITY): Payer: Self-pay

## 2016-02-25 DIAGNOSIS — E669 Obesity, unspecified: Secondary | ICD-10-CM | POA: Insufficient documentation

## 2016-02-25 DIAGNOSIS — Z6836 Body mass index (BMI) 36.0-36.9, adult: Secondary | ICD-10-CM | POA: Insufficient documentation

## 2016-02-25 DIAGNOSIS — R0789 Other chest pain: Principal | ICD-10-CM | POA: Insufficient documentation

## 2016-02-25 DIAGNOSIS — E039 Hypothyroidism, unspecified: Secondary | ICD-10-CM | POA: Insufficient documentation

## 2016-02-25 DIAGNOSIS — R9430 Abnormal result of cardiovascular function study, unspecified: Secondary | ICD-10-CM

## 2016-02-25 DIAGNOSIS — Z79899 Other long term (current) drug therapy: Secondary | ICD-10-CM | POA: Insufficient documentation

## 2016-02-25 DIAGNOSIS — I209 Angina pectoris, unspecified: Secondary | ICD-10-CM

## 2016-02-25 DIAGNOSIS — E890 Postprocedural hypothyroidism: Secondary | ICD-10-CM | POA: Insufficient documentation

## 2016-02-25 DIAGNOSIS — R0600 Dyspnea, unspecified: Secondary | ICD-10-CM | POA: Diagnosis present

## 2016-02-25 DIAGNOSIS — R9439 Abnormal result of other cardiovascular function study: Secondary | ICD-10-CM | POA: Insufficient documentation

## 2016-02-25 DIAGNOSIS — R079 Chest pain, unspecified: Secondary | ICD-10-CM

## 2016-02-25 DIAGNOSIS — R0602 Shortness of breath: Secondary | ICD-10-CM | POA: Insufficient documentation

## 2016-02-25 HISTORY — DX: Obesity, unspecified: E66.9

## 2016-02-25 LAB — TSH: TSH: 0.435 u[IU]/mL (ref 0.350–4.500)

## 2016-02-25 LAB — TROPONIN I: Troponin I: <0.03 ng/mL (ref <0.03)

## 2016-02-25 LAB — CBC
HCT: 36.6 % (ref 36.0–46.0)
Hemoglobin: 12.4 g/dL (ref 12.0–15.0)
MCH: 29.7 pg (ref 26.0–34.0)
MCHC: 33.9 g/dL (ref 30.0–36.0)
MCV: 87.6 fL (ref 78.0–100.0)
Platelets: 316 10*3/uL (ref 150–400)
RBC: 4.18 MIL/uL (ref 3.87–5.11)
RDW: 13.3 % (ref 11.5–15.5)
WBC: 6.4 10*3/uL (ref 4.0–10.5)

## 2016-02-25 LAB — BASIC METABOLIC PANEL
Anion gap: 8 (ref 5–15)
BUN: 9 mg/dL (ref 6–20)
CO2: 22 mmol/L (ref 22–32)
Calcium: 9.2 mg/dL (ref 8.9–10.3)
Chloride: 108 mmol/L (ref 101–111)
Creatinine, Ser: 0.78 mg/dL (ref 0.44–1.00)
GFR calc Af Amer: >60 mL/min (ref >60)
GFR calc non Af Amer: >60 mL/min (ref >60)
Glucose, Bld: 103 mg/dL — ABNORMAL HIGH (ref 65–99)
Potassium: 3.5 mmol/L (ref 3.5–5.1)
Sodium: 138 mmol/L (ref 135–145)

## 2016-02-25 LAB — I-STAT TROPONIN, ED: Troponin i, poc: 0 ng/mL (ref 0.00–0.08)

## 2016-02-25 IMAGING — CR DG CHEST 2V
2 series · 2 of 2 positions shown · non-contrast
Comparison: [DATE]

CLINICAL DATA: Chest pain and shortness-of-breath today radiating
to left-sided neck.

EXAM:
CHEST  2 VIEW

[chest pa]
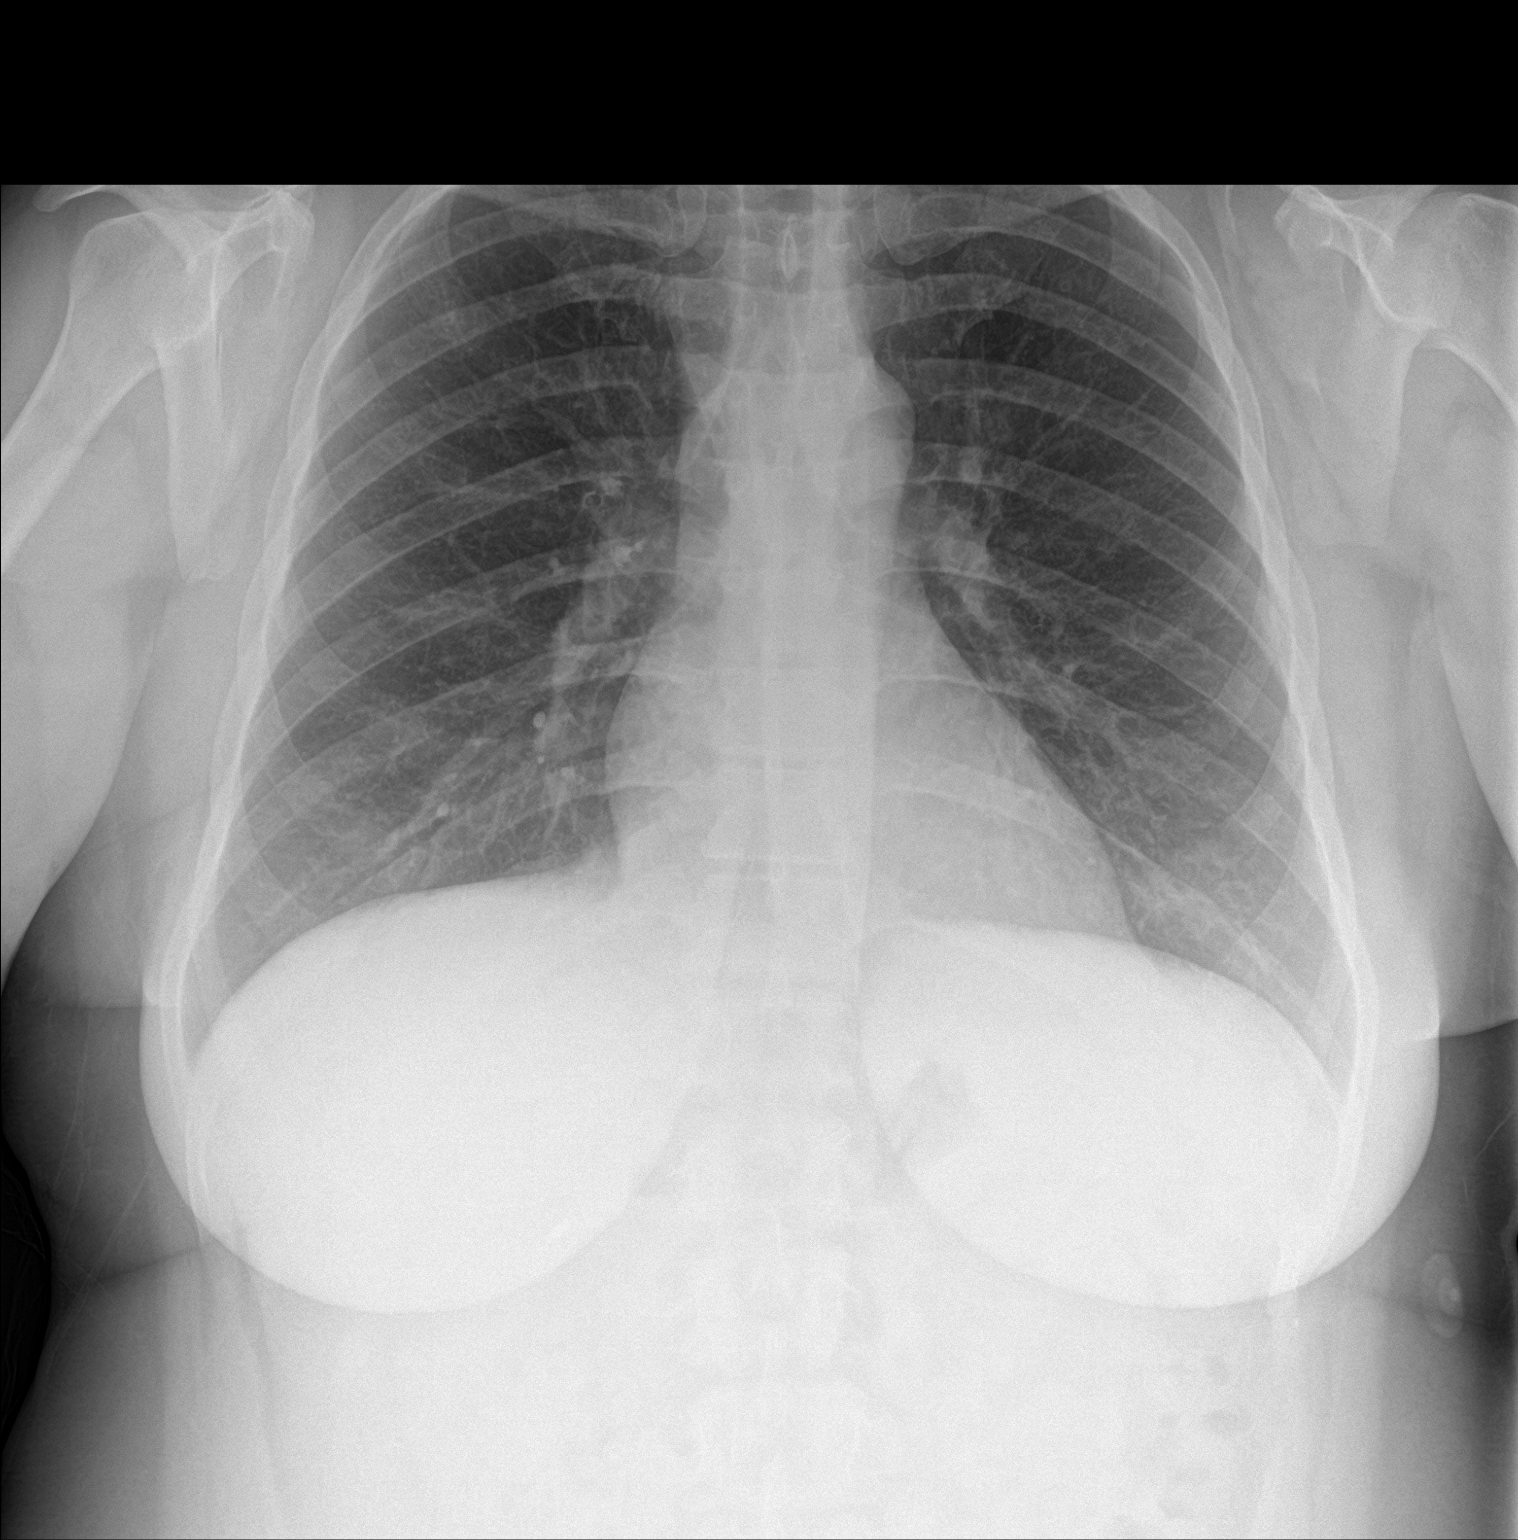

[chest lat]
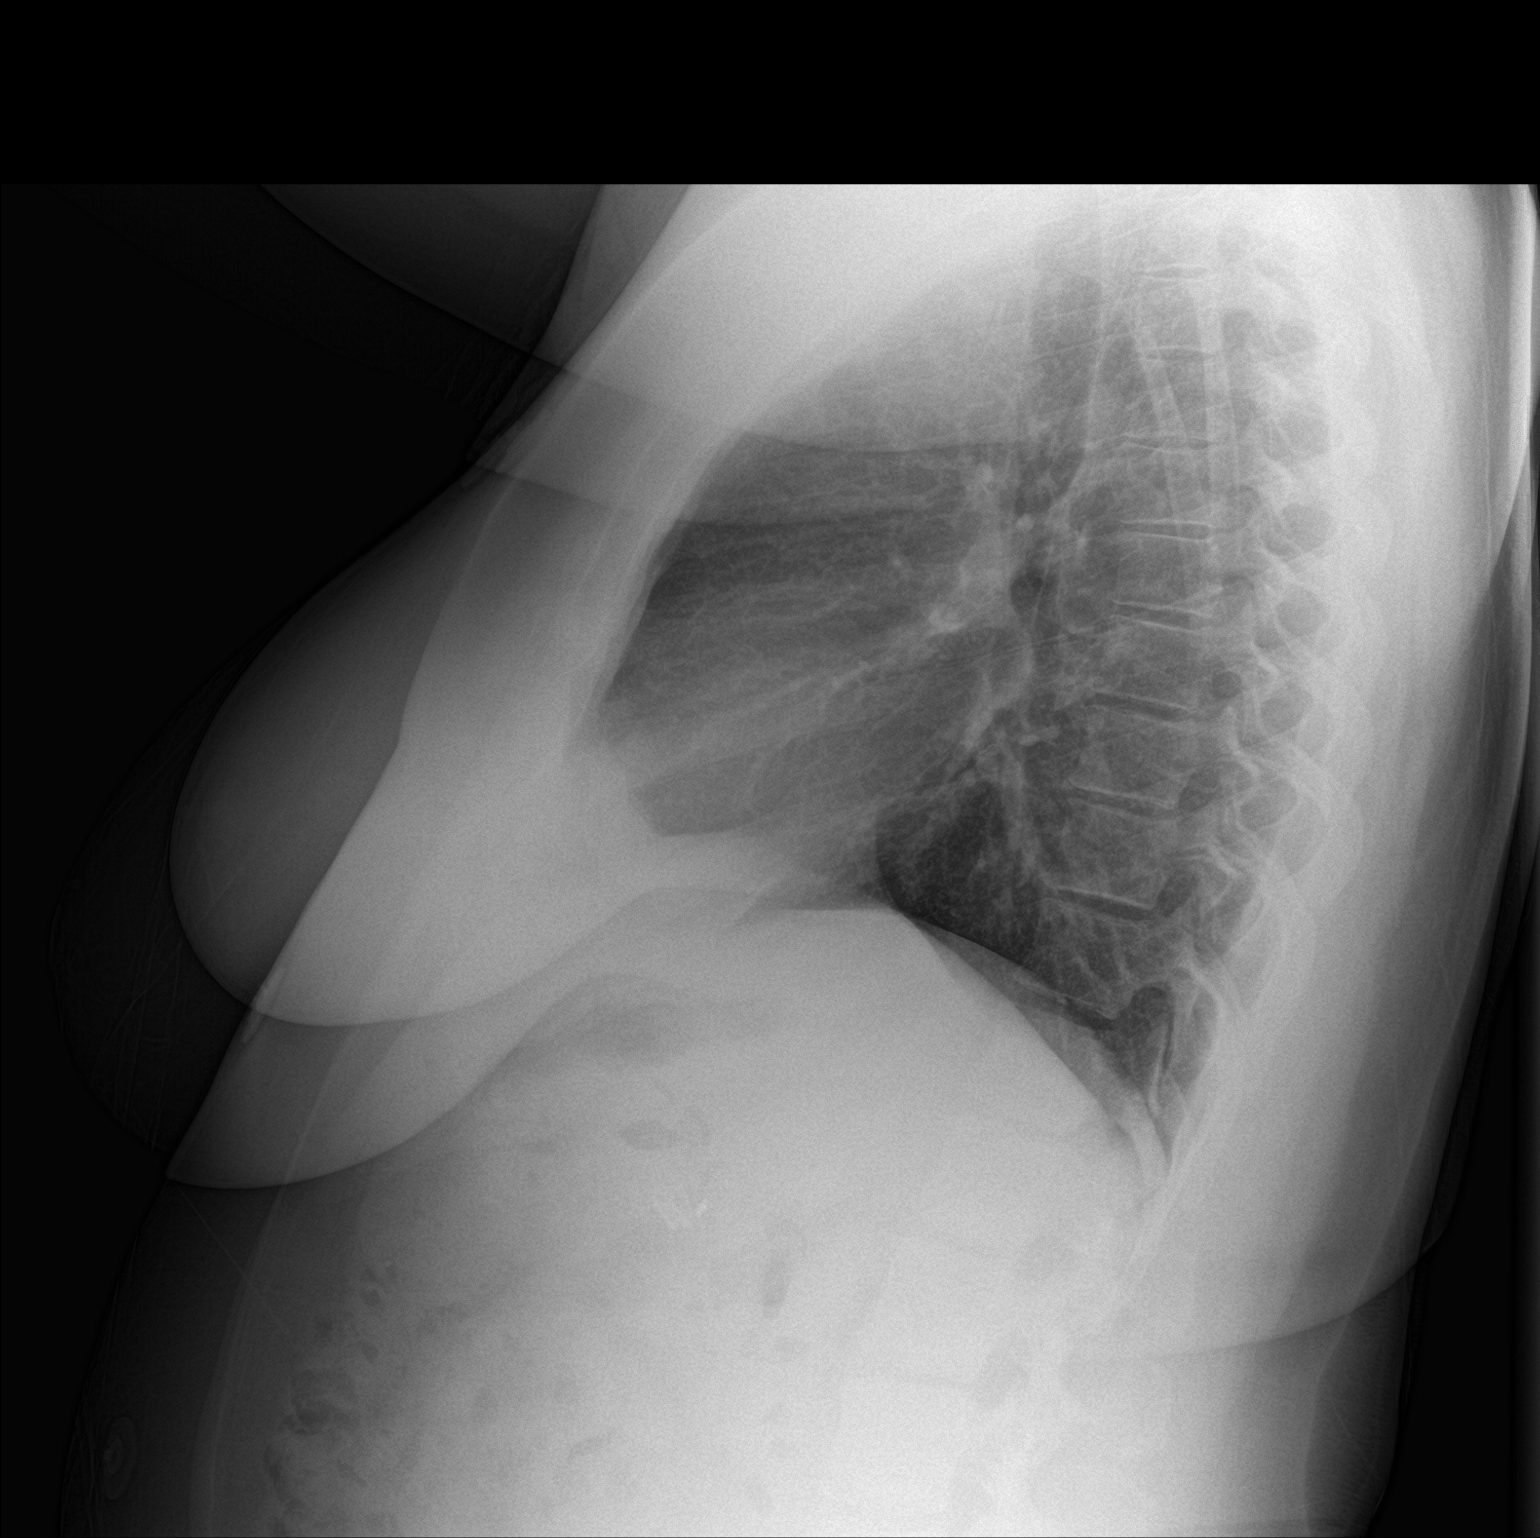

[2 of 2 positions shown; findings below may reference images not displayed]

FINDINGS: The heart size and mediastinal contours are within normal limits.
Both lungs are clear. The visualized skeletal structures are
unremarkable.
IMPRESSION: No active cardiopulmonary disease.

## 2016-02-25 MED ORDER — LEVOTHYROXINE SODIUM 25 MCG PO TABS
137.0000 ug | ORAL_TABLET | Freq: Every day | ORAL | Status: DC
  Administered 2016-02-26 – 2016-02-27 (×2): 137 ug via ORAL
  Filled 2016-02-25 (×2): qty 1

## 2016-02-25 MED ORDER — ENOXAPARIN SODIUM 40 MG/0.4ML ~~LOC~~ SOLN
40.0000 mg | SUBCUTANEOUS | Status: DC
  Administered 2016-02-25 – 2016-02-26 (×2): 40 mg via SUBCUTANEOUS
  Filled 2016-02-25 (×2): qty 0.4

## 2016-02-25 MED ORDER — ONDANSETRON HCL 4 MG/2ML IJ SOLN
4.0000 mg | Freq: Four times a day (QID) | INTRAMUSCULAR | Status: DC | PRN
  Administered 2016-02-26: 4 mg via INTRAVENOUS
  Filled 2016-02-25: qty 2

## 2016-02-25 MED ORDER — ASPIRIN 81 MG PO CHEW
324.0000 mg | CHEWABLE_TABLET | Freq: Once | ORAL | Status: AC
  Administered 2016-02-25: 324 mg via ORAL
  Filled 2016-02-25: qty 4

## 2016-02-25 MED ORDER — ACETAMINOPHEN 325 MG PO TABS: 650.0000 mg | ORAL_TABLET | ORAL | Status: DC | PRN

## 2016-02-25 MED ORDER — NITROGLYCERIN 0.4 MG SL SUBL
0.4000 mg | SUBLINGUAL_TABLET | SUBLINGUAL | Status: DC | PRN
  Administered 2016-02-25 – 2016-02-26 (×3): 0.4 mg via SUBLINGUAL
  Filled 2016-02-25: qty 1

## 2016-02-25 NOTE — ED Notes (Addendum)
Patient here with chest heaviness, left sided neck pain and shortness of breath that started a short time prior to arrival. No associated symptoms with same. Alert and oriented, appears slightly anxious. EKG done on arival.

## 2016-02-25 NOTE — ED Notes (Signed)
Attempted report x1. 

## 2016-02-25 NOTE — H&P (Signed)
Family Medicine [facility] Admission History and Physical Service Pager: [phone]  Patient name: Morgan Stevens Medical record number: [id number] Date of birth: 03-27-1966 Age: 50 y.o. Gender: female  [facility] Consultants: None Code Status: Full  Chief Complaint: Neck pain, dyspnea  Assessment and Plan: Morgan Stevens is a 50 y.o. female presenting with Neck pain and dyspnea . PMH is significant for hypothyroidism  Neck pain / dyspnea. Symptoms resolved in ED. Heart score of 4 on admission, raising concern for cardiac etiology. Risk factors include age and obesity. Initial EKG, troponin, and CXR negative. PERC 0. No signs of volume overload.  - 324mg  ASA and nitroglycerin given in ED - Trend troponins q6hrs x3 - EKG in AM - Telemetry - Check A1c, lipid panel, TSH  Hypothyroidism - Check TSH - Continue home levothyroxine  FEN/GI: Heart healthy diet, SLIV Prophylaxis: Lovenox  Disposition: Admitted for observation pending above management. Anticipate discharge home tomorrow if work up negative and symptoms resolved.   History of Present Illness:  Morgan Stevens is a 50 y.o. female presenting with neck pain and shortness of breath.  Patient with sudden onset left shoulder pain, shortness of breath, and chest "heaviness" earlier this afternoon. Patient was a passenger in her car when the symptoms start and she and her husband presented directly to the ED. Pain described as a sharp, shooting pain that radiated in to her left jaw. Patient denied any chest pain, but said that her chest felt "heavy" and that it felt difficult to breath. Symptoms lasted for about 30 minutes then subsided. Patient received ASA 324mg  and nitroglycerin in the ED, but has not taken any other medications. She also had mild nausea during the episode. Patient currently without symptoms.   Otherwise, the patient has been well. No fevers or chills. No cough.  No abdominal pain. No conspitation or diarrhea. Patient is from [place] and has her PCP's office there. Currently only takes levothyroxine. Has never had a similar episode in the past. Mother has a history of mitral valve replacement and CHF, but no other known history of heart disease.  Review Of Systems: Per HPI, otherwise the remainder of the systems were negative.  Patient Active Problem List   Diagnosis Date Noted  . Angina pectoris (HCC) 02/25/2016  . Chest pain 09/07/2015    Past Medical History: Past Medical History  Diagnosis Date  . Thyroid disease   . Reflux     Past Surgical History: Past Surgical History  Procedure Laterality Date  . Thyroidectomy    . Cholecystectomy      Social History: Social History  Substance Use Topics  . Smoking status: Never Smoker   . Smokeless tobacco: None  . Alcohol Use: No   Additional social history: None  Please also refer to relevant sections of EMR.  Family History: Maternal history of CHF  Allergies and Medications: No Known Allergies No current facility-administered medications on file prior to encounter.   Current Outpatient Prescriptions on File Prior to Encounter  Medication Sig Dispense Refill  . cyclobenzaprine (FLEXERIL) 10 MG tablet Take 1 tablet (10 mg total) by mouth 3 (three) times daily as needed for muscle spasms. 30 tablet 0  . levothyroxine (SYNTHROID, LEVOTHROID) 137 MCG tablet Take 137 mcg by mouth daily before breakfast.    . naproxen (NAPROSYN) 500 MG tablet Take 1 tablet (500 mg total) by mouth 2 (two) times daily with a meal. 60 tablet 0    Objective: BP  117/77 mmHg  Pulse 68  Temp(Src) 98.4 F (36.9 C) (Oral)  Resp 17  Ht 5\' 4"  (1.626 m)  Wt 219 lb (99.338 kg)  BMI 37.57 kg/m2  SpO2 100%  LMP 02/18/2016 Exam: General: Anxious appearing 50 year old female lying in hospital bed in NAD, speaking in full sentences.  Eyes: PERRL, EOMI ENTM: MMM, O/P clear Neck: FROM without pain.  Nontender to palpation. No meningismus.  Cardiovascular: RRR, no murmurs noted Respiratory: NWOB, CTAB Abdomen: Obese, S, NT, ND MSK: No LE edema Skin: Warm, dry Neuro: Alert and oriented. No focal deficits Psych: Anxious appearing. Normal thought content.   Labs and Imaging: CBC BMET   Recent Labs Lab 02/25/16 1553  WBC 6.4  HGB 12.4  HCT 36.6  PLT 316    Recent Labs Lab 02/25/16 1553  NA 138  K 3.5  CL 108  CO2 22  BUN 9  CREATININE 0.78  GLUCOSE 103*  CALCIUM 9.2     Trop 0.00  EKG: NSR, TWI in V1 (unchanged from prior)  Dg Chest 2 View  02/25/2016  CLINICAL DATA:  Chest pain and shortness-of-breath today radiating to left-sided neck. EXAM: CHEST  2 VIEW COMPARISON:  09/06/2015 FINDINGS: The heart size and mediastinal contours are within normal limits. Both lungs are clear. The visualized skeletal structures are unremarkable. IMPRESSION: No active cardiopulmonary disease. Electronically Signed   By: Elberta Fortisaniel  Boyle M.D.   On: 02/25/2016 16:29   Ardith Darkaleb M Parker, MD 02/25/2016, 6:31 PM PGY-3, [facility] FPTS Intern pager: [phone], text pages welcome

## 2016-02-25 NOTE — ED Provider Notes (Signed)
CSN: [id number]     Arrival date & time 02/25/16  1546 History   First MD Initiated Contact with Patient 02/25/16 1724     Chief Complaint  Patient presents with  . Chest Pain  . Shortness of Breath     (Consider location/radiation/quality/duration/timing/severity/associated sxs/prior Treatment) HPI Comments: Pt comes in with cc of chest tightness, neck pain, jaw pain. Pt has hx of thyroid dz. She reports that prior to the ER arrival, pt started having neck pain starting at the shoulder and moving towards the jaw, described as sharp pain. Pt then started having chest tightness and heaviness with dib, nausa and she started feeling hot. No numbness or tingling. PT denies hx of same pain in the past. No cough, Pt has no hx of PE, DVT and denies any exogenous estrogen use, long distance travels or surgery in the past 6 weeks, active cancer, recent immobilization. Pt denies any CAD hx in the family or at premature age. No drug use or smoking.   ROS 10 Systems reviewed and are negative for acute change except as noted in the HPI.     Patient is a 50 y.o. female presenting with chest pain and shortness of breath. The history is provided by the patient.  Chest Pain Associated symptoms: shortness of breath   Shortness of Breath Associated symptoms: chest pain     Past Medical History  Diagnosis Date  . Thyroid disease   . Reflux   . Obesity (BMI 30-39.9)    Past Surgical History  Procedure Laterality Date  . Thyroidectomy    . Cholecystectomy    . Cesarean section     No family history on file. Social History  Substance Use Topics  . Smoking status: Never Smoker   . Smokeless tobacco: Never Used  . Alcohol Use: No   OB History    No data available     Review of Systems  Respiratory: Positive for shortness of breath.   Cardiovascular: Positive for chest pain.      Allergies  Review of patient's allergies indicates no known allergies.  Home Medications   Prior to  Admission medications   Medication Sig Start Date End Date Taking? Authorizing Provider  levothyroxine (SYNTHROID, LEVOTHROID) 137 MCG tablet Take 137 mcg by mouth daily before breakfast.   Yes Historical Provider, MD  cyclobenzaprine (FLEXERIL) 10 MG tablet Take 1 tablet (10 mg total) by mouth 3 (three) times daily as needed for muscle spasms. Patient not taking: Reported on 02/25/2016 01/20/16   Chinita Pesterari B Triplett, FNP  naproxen (NAPROSYN) 500 MG tablet Take 1 tablet (500 mg total) by mouth 2 (two) times daily with a meal. Patient not taking: Reported on 02/25/2016 01/20/16 01/19/17  Cari B Triplett, FNP   BP 114/68 mmHg  Pulse 80  Temp(Src) 98.4 F (36.9 C) (Oral)  Resp 15  Ht 5\' 4"  (1.626 m)  Wt 219 lb 12.8 oz (99.7 kg)  BMI 37.71 kg/m2  SpO2 100%  LMP 02/18/2016 Physical Exam  Constitutional: She is oriented to person, place, and time. She appears well-developed.  HENT:  Head: Normocephalic and atraumatic.  Eyes: Conjunctivae and EOM are normal. Pupils are equal, round, and reactive to light.  Neck: Normal range of motion. Neck supple. No JVD present.  Cardiovascular: Normal rate, regular rhythm, normal heart sounds and intact distal pulses.   Pulmonary/Chest: Effort normal and breath sounds normal. No respiratory distress.  Abdominal: Soft. Bowel sounds are normal. She exhibits no distension. There is no  tenderness. There is no rebound and no guarding.  Neurological: She is alert and oriented to person, place, and time.  Skin: Skin is warm and dry.  Nursing note and vitals reviewed.   ED Course  Procedures (including critical care time) Labs Review Labs Reviewed  BASIC METABOLIC PANEL - Abnormal; Notable for the following:    Glucose, Bld 103 (*)    All other components within normal limits  LIPID PANEL - Abnormal; Notable for the following:    HDL 32 (*)    LDL Cholesterol 101 (*)    All other components within normal limits  CBC  TROPONIN I  TROPONIN I  TROPONIN I  TSH   HEMOGLOBIN A1C  I-STAT TROPOININ, ED    Imaging Review Dg Chest 2 View  02/25/2016  CLINICAL DATA:  Chest pain and shortness-of-breath today radiating to left-sided neck. EXAM: CHEST  2 VIEW COMPARISON:  09/06/2015 FINDINGS: The heart size and mediastinal contours are within normal limits. Both lungs are clear. The visualized skeletal structures are unremarkable. IMPRESSION: No active cardiopulmonary disease. Electronically Signed   By: Elberta Fortisaniel  Boyle M.D.   On: 02/25/2016 16:29   Nm Myocar Multi W/spect W/wall Motion / Ef  02/26/2016  CLINICAL DATA:  Chest pain.  Shortness of breath. EXAM: MYOCARDIAL IMAGING WITH SPECT (REST AND PHARMACOLOGIC-STRESS) GATED LEFT VENTRICULAR WALL MOTION STUDY LEFT VENTRICULAR EJECTION FRACTION TECHNIQUE: Standard myocardial SPECT imaging was performed after resting intravenous injection of 10 mCi Tc-2124m tetrofosmin. Subsequently, intravenous infusion of Lexiscan was performed under the supervision of the Cardiology staff. At peak effect of the drug, 30 mCi Tc-424m tetrofosmin was injected intravenously and standard myocardial SPECT imaging was performed. Quantitative gated imaging was also performed to evaluate left ventricular wall motion, and estimate left ventricular ejection fraction. COMPARISON:  None. FINDINGS: Perfusion: A large area of decreased myocardial activity is seen in the anterior and lateral walls of the left ventricle on stress imaging. This shows reversibility on resting images, consistent with reversible ischemia. Wall Motion: Normal left ventricular wall motion. No left ventricular dilation. Left Ventricular Ejection Fraction: 67 % End diastolic volume 84 ml End systolic volume 27 ml IMPRESSION: 1. Large reversible defect in the anterior and lateral left ventricular walls, consistent with reversible ischemia. 2. Normal left ventricular wall motion. 3. Left ventricular ejection fraction 67% 4. Non invasive risk stratification*: High *2012 Appropriate Use  Criteria for Coronary Revascularization Focused Update: J Am Coll Cardiol. 2012;59(9):857-881. http://content.dementiazones.comonlinejacc.org/article.aspx?articleid=1201161 Electronically Signed   By: Myles RosenthalJohn  Stahl M.D.   On: 02/26/2016 14:11   I have personally reviewed and evaluated these images and lab results as part of my medical decision-making.   EKG Interpretation   Date/Time:  Saturday February 25 2016 15:49:54 EDT Ventricular Rate:  80 PR Interval:  166 QRS Duration: 74 QT Interval:  380 QTC Calculation: 438 R Axis:   58 Text Interpretation:  Normal sinus rhythm Normal ECG No acute changes No  significant change since last tracing Confirmed by Caran Storck, MD, Alaynah Schutter  [id number]) on 02/25/2016 5:27:40 PM      MDM   Final diagnoses:  Angina pectoris (HCC)    Chest pain with HEAR score is 4. Chest pain resolved with nitro. Will admit.  Derwood KaplanAnkit Constance Hackenberg, MD 02/26/16 2039

## 2016-02-25 NOTE — ED Notes (Signed)
Attempted report x 2 

## 2016-02-26 ENCOUNTER — Encounter (HOSPITAL_COMMUNITY): Payer: Self-pay | Admitting: Cardiology

## 2016-02-26 ENCOUNTER — Observation Stay (HOSPITAL_COMMUNITY): Payer: Self-pay

## 2016-02-26 DIAGNOSIS — R9430 Abnormal result of cardiovascular function study, unspecified: Secondary | ICD-10-CM

## 2016-02-26 DIAGNOSIS — E669 Obesity, unspecified: Secondary | ICD-10-CM | POA: Diagnosis present

## 2016-02-26 DIAGNOSIS — R079 Chest pain, unspecified: Secondary | ICD-10-CM

## 2016-02-26 DIAGNOSIS — E038 Other specified hypothyroidism: Secondary | ICD-10-CM

## 2016-02-26 LAB — NM MYOCAR MULTI W/SPECT W/WALL MOTION / EF
Estimated workload: 1 METS
Exercise duration (min): 0 min
Exercise duration (sec): 0 s
MPHR: 173 {beats}/min
Peak HR: 105 {beats}/min
Percent HR: 60 %
RPE: 0
Rest HR: 68 {beats}/min

## 2016-02-26 LAB — LIPID PANEL
Cholesterol: 153 mg/dL (ref 0–200)
HDL: 32 mg/dL — ABNORMAL LOW (ref >40)
LDL Cholesterol: 101 mg/dL — ABNORMAL HIGH (ref 0–99)
Total CHOL/HDL Ratio: 4.8 RATIO
Triglycerides: 101 mg/dL (ref <150)
VLDL: 20 mg/dL (ref 0–40)

## 2016-02-26 LAB — TROPONIN I
Troponin I: <0.03 ng/mL (ref <0.03)
Troponin I: <0.03 ng/mL (ref <0.03)

## 2016-02-26 MED ORDER — SODIUM CHLORIDE 0.9% FLUSH: 3.0000 mL | INTRAVENOUS | Status: DC | PRN

## 2016-02-26 MED ORDER — SODIUM CHLORIDE 0.9 % WEIGHT BASED INFUSION
3.0000 mL/kg/h | INTRAVENOUS | Status: DC
  Administered 2016-02-27: 3 mL/kg/h via INTRAVENOUS

## 2016-02-26 MED ORDER — SODIUM CHLORIDE 0.9% FLUSH
3.0000 mL | Freq: Two times a day (BID) | INTRAVENOUS | Status: DC
  Administered 2016-02-26: 3 mL via INTRAVENOUS

## 2016-02-26 MED ORDER — SODIUM CHLORIDE 0.9 % IV SOLN: 250.0000 mL | INTRAVENOUS | Status: DC | PRN

## 2016-02-26 MED ORDER — REGADENOSON 0.4 MG/5ML IV SOLN
INTRAVENOUS | Status: AC
Start: 2016-02-26 — End: 2016-02-27
  Filled 2016-02-26: qty 5

## 2016-02-26 MED ORDER — ASPIRIN 81 MG PO CHEW: 324.0000 mg | CHEWABLE_TABLET | ORAL | Status: DC

## 2016-02-26 MED ORDER — TECHNETIUM TC 99M TETROFOSMIN IV KIT
10.0000 | PACK | Freq: Once | INTRAVENOUS | Status: AC | PRN
  Administered 2016-02-26: 10 via INTRAVENOUS

## 2016-02-26 MED ORDER — SODIUM CHLORIDE 0.9 % WEIGHT BASED INFUSION: 1.0000 mL/kg/h | INTRAVENOUS | Status: DC

## 2016-02-26 MED ORDER — ASPIRIN 81 MG PO CHEW
81.0000 mg | CHEWABLE_TABLET | ORAL | Status: AC
  Administered 2016-02-27: 81 mg via ORAL
  Filled 2016-02-26: qty 1

## 2016-02-26 MED ORDER — TECHNETIUM TC 99M TETROFOSMIN IV KIT
30.0000 | PACK | Freq: Once | INTRAVENOUS | Status: AC | PRN
  Administered 2016-02-26: 30 via INTRAVENOUS

## 2016-02-26 MED ORDER — NITROGLYCERIN 0.4 MG SL SUBL
SUBLINGUAL_TABLET | SUBLINGUAL | Status: AC
  Administered 2016-02-26: 0.4 mg via SUBLINGUAL
  Filled 2016-02-26: qty 1

## 2016-02-26 MED ORDER — REGADENOSON 0.4 MG/5ML IV SOLN
0.4000 mg | Freq: Once | INTRAVENOUS | Status: AC
Start: 2016-02-26 — End: 2016-02-26
  Administered 2016-02-26: 0.4 mg via INTRAVENOUS
  Filled 2016-02-26: qty 5

## 2016-02-26 NOTE — Progress Notes (Signed)
Pt admitted with chest pain, EKG normal neg troponin.  Lexiscan myoview completed without complications.   Nuc results to follow.

## 2016-02-26 NOTE — Progress Notes (Signed)
Family Medicine Teaching Service Daily Progress Note Intern Pager: [phone]  Patient name: Morgan Stevens Medical record number: [id number] Date of birth: November 04, 1965 Age: 50 y.o. Gender: female  Primary Care Provider: [facility] Consultants: None Code Status: Full  Pt Overview and Major Events to Date:  7/1- admitted for ACS rule out  Assessment and Plan:  Morgan Stevens is a 50 y.o. female presenting with Neck pain and dyspnea . PMH is significant for hypothyroidism, obesity  Neck pain / dyspnea. Symptoms resolved in ED.  - 324mg  ASA and nitroglycerin given in ED - Trend troponins q6hrs x3 negative - Repeat EKG negative for evidence if ischemia - Telemetry -  A1c pending, lipid panel and TSH largely wnl  Hypothyroidism - TSH 0.435 - Continue home levothyroxine  FEN/GI: Heart healthy diet, SLIV Prophylaxis: Lovenox  Disposition: Home  Subjective:  Denies chest pain, SOB  Objective: Temp:  [98.3 F (36.8 C)-98.4 F (36.9 C)] 98.3 F (36.8 C) (07/02 0500) Pulse Rate:  [63-78] 74 (07/01 2200) Resp:  [16-22] 20 (07/01 2200) BP: (111-139)/(67-83) 132/79 mmHg (07/01 2200) SpO2:  [100 %] 100 % (07/01 2200) Weight:  [219 lb (99.338 kg)-220 lb (99.791 kg)] 220 lb (99.791 kg) (07/02 0500) Physical Exam: General: NAD Cardiovascular: RRR Respiratory: CTAB Abdomen: obese, soft, non tender Extremities: no LE edema  Laboratory:  Recent Labs Lab 02/25/16 1553  WBC 6.4  HGB 12.4  HCT 36.6  PLT 316    Recent Labs Lab 02/25/16 1553  NA 138  K 3.5  CL 108  CO2 22  BUN 9  CREATININE 0.78  CALCIUM 9.2  GLUCOSE 103*     Bonney AidAlyssa A Diya Gervasi, MD 02/26/2016, 8:06 AM PGY-3, [facility] FPTS Intern pager: [phone], text pages welcome

## 2016-02-26 NOTE — Plan of Care (Signed)
Problem: Consults Goal: Cardiac Cath Patient Education (See Patient Education module for education specifics.) Outcome: Progressing Patient viewed cardiac cath video with family at bedside.

## 2016-02-26 NOTE — Progress Notes (Signed)
nuc study is + for ischemia.  Please call for cardiology consult if you desire.

## 2016-02-26 NOTE — Discharge Summary (Signed)
Family Medicine Teaching [facility] Discharge Summary  Patient name: Morgan CoventryRuth Stevens Medical record number: [id number] Date of birth: Oct 17, 1965 Age: 50 y.o. Gender: female Date of Admission: 02/25/2016  Date of Discharge: 02/26/2016  Admitting Physician: Tobey GrimJeffrey H Walden, MD  [facility] Consultants: None  Indication for Hospitalization: ACS rule out  Discharge Diagnoses/Problem List:  Patient Active Problem List   Diagnosis Date Noted  . Angina pectoris (HCC) 02/25/2016  . Dyspnea 02/25/2016  . Hypothyroidism 02/25/2016  . Chest pain 09/07/2015      Disposition: Home  Discharge Condition: Stable  Discharge Exam:   Temp: [98.3 F (36.8 C)-98.4 F (36.9 C)] 98.3 F (36.8 C) (07/02 0500) Pulse Rate: [63-78] 74 (07/01 2200) Resp: [16-22] 20 (07/01 2200) BP: (111-139)/(67-83) 132/79 mmHg (07/01 2200) SpO2: [100 %] 100 % (07/01 2200) Weight: [219 lb (99.338 kg)-220 lb (99.791 kg)] 220 lb (99.791 kg) (07/02 0500) Physical Exam: General: NAD Cardiovascular: RRR Respiratory: CTAB Abdomen: obese, soft, non tender Extremities: no LE edema  Brief Hospital Course:   Morgan CoventryRuth Kirt is a 50 y.o. female presenting with Neck pain and dyspnea . PMH is significant for hypothyroidism, obesity Her neck pain resolved with nitroglycerin and aspirin. Her EKGs  were negative for evidence of ischemia x2. Her troponins were negative x3. She was discharged to follow with her PCP for any needed further work up.   Issues for Follow Up:  1. Cardiac work up  Significant Procedures:  None  Significant Labs and Imaging:   Recent Labs Lab 02/25/16 1553  WBC 6.4  HGB 12.4  HCT 36.6  PLT 316    Recent Labs Lab 02/25/16 1553  NA 138  K 3.5  CL 108  CO2 22  GLUCOSE 103*  BUN 9  CREATININE 0.78  CALCIUM 9.2     Results/Tests Pending at Time of Discharge:  None  Discharge Medications:    Medication List    TAKE these  medications        cyclobenzaprine 10 MG tablet  Commonly known as:  FLEXERIL  Take 1 tablet (10 mg total) by mouth 3 (three) times daily as needed for muscle spasms.     levothyroxine 137 MCG tablet  Commonly known as:  SYNTHROID, LEVOTHROID  Take 137 mcg by mouth daily before breakfast.     naproxen 500 MG tablet  Commonly known as:  NAPROSYN  Take 1 tablet (500 mg total) by mouth 2 (two) times daily with a meal.        Discharge Instructions: Please refer to Patient Instructions section of EMR for full details.  Patient was counseled important signs and symptoms that should prompt return to medical care, changes in medications, dietary instructions, activity restrictions, and follow up appointments.   Follow-Up Appointments: Follow-up Information    Schedule an appointment as soon as possible for a visit with [facility].   Contact information:   [address] [place] [address] [phone]       Bonney AidAlyssa A Siah Steely, MD 02/26/2016, 9:27 AM PGY-3, [facility]

## 2016-02-26 NOTE — Progress Notes (Signed)
Pt called out c/o chest heaviness. Pt rated chest pressure 10 out of 10. BP 146/88 HR 94, pt given nitrox2. After 1st intro, pt rated chest heaviness 5 out of 10. BP 138/102 HR 86, pt given 2nd nitro. After 2nd nitro pt rated chest heaviness 5 out of 10. BP 115/82 HR 87, pt given 3rd nitro. After 3rd nitro, pt rated chest heaviness 1 out of 10. EKG performed. MD notified. Will continue to closely monitor.    Reginold AgentWhitney Kennley Schwandt, RN

## 2016-02-26 NOTE — Consult Note (Signed)
Cardiology Consult Note  Admit date: 02/25/2016 Name: Morgan Stevens 50 y.o.  female DOB:  1966-05-20 MRN:  [id number]  Today's date:  02/26/2016  [facility]   Reason for Consultation:    Chest and neck pain, abnormal myocardial perfusion scan  IMPRESSIONS: 1.  Prolonged neck and chest discomfort of around 30 minutes associated with an abnormal myocardial perfusion scan suggestive of high-risk ischemia 2.  Obesity 3.  Reflux 4.  Borderline hyperlipidemia  RECOMMENDATION: In light of symptoms as well as high risk stress testing suggests cardiac catheterization.  In the absence of other risk factors there is a possibility that this could've represented a coronary dissection although her EKG is normal. Cardiac catheterization was discussed with the patient fully including risks of myocardial infarction, death, stroke, bleeding, arrhythmia, dye allergy, renal insufficiency or bleeding.  The patient understands and is willing to proceed.  Possibility of intervention at the same time also discussed with patient and they understand and are agreeable to proceed  HISTORY: This very nice 50 year old black female is seen for evaluation of prolonged chest pain and abnormal stress test.  The patient has some intermittent chest pain through the years and has had some stress testing in the past.  [place] with some chest discomfort and said that she was under stress but never followed up with cardiology.  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  Yesterday she was driving in an automobile and had the onset of left neck pain which then beginning associated with shortness of breath and midsternal chest discomfort with some radiation into her neck.  The pain lasted around 30 minutes and she came to the emergency room.  Serial troponins were negative and EKG was normal.  A Myoview test  done earlier today showed a large anterior reversible defect consistent with ischemia.  She is currently pain-free.  She has no premature family history of cardiac disease.  She has no known hyperlipidemia or hypertension.  She is not a diabetic.  She has been obese for a number of years.  Past Medical History  Diagnosis Date  . Thyroid disease   . Reflux   . Obesity (BMI 30-39.9)       Past Surgical History  Procedure Laterality Date  . Thyroidectomy    . Cholecystectomy    . Cesarean section      Allergies:  has No Known Allergies.   Medications: Prior to Admission medications   Medication Sig Start Date End Date Taking? Authorizing Provider  levothyroxine (SYNTHROID, LEVOTHROID) 137 MCG tablet Take 137 mcg by mouth daily before breakfast.   Yes Historical Provider, MD  cyclobenzaprine (FLEXERIL) 10 MG tablet Take 1 tablet (10 mg total) by mouth 3 (three) times daily as needed for muscle spasms. Patient not taking: Reported on 02/25/2016 01/20/16   Chinita Pesterari B Triplett, FNP  naproxen (NAPROSYN) 500 MG tablet Take 1 tablet (500 mg total) by mouth 2 (two) times daily with a meal. Patient not taking: Reported on 02/25/2016 01/20/16 01/19/17  Chinita Pesterari B Triplett, FNP   Family History: Family Status  Relation Status Death Age  . Father Deceased 6463    prostate cancer  . Mother Deceased 2681    CHF, pacer  . Brother Deceased     brain aneurysm  . Sister Alive   . Sister Alive   . Sister Alive   . Sister  Alive   . Sister Alive    Social History:   reports that she has never smoked. She has never used smokeless tobacco. She reports that she does not drink alcohol or use illicit drugs.   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  Other than as noted above the remainder of  the review of systems is unremarkable.  Physical Exam: BP 142/82 mmHg  Pulse 94  Temp(Src) 98.4 F (36.9 C) (Oral)  Resp 20  Ht 5\' 4"  (1.626 m)  Wt 99.791 kg (220 lb)  BMI 37.74 kg/m2  SpO2 100%  LMP 02/18/2016  General appearance: Pleasant mildly obese black female in no acute distress Head: Normocephalic, without obvious abnormality, atraumatic Eyes: conjunctivae/corneas clear. PERRL, EOM's intact. Fundi not examined  Neck: no adenopathy, no carotid bruit, no JVD, supple, symmetrical, trachea midline and Healed anterior thyroid scar Lungs: clear to auscultation bilaterally Heart: regular rate and rhythm, S1, S2 normal, no murmur, click, rub or gallop Abdomen: soft, non-tender; bowel sounds normal; no masses,  no organomegaly Pelvic: deferred Extremities: extremities normal, atraumatic, no cyanosis or edema Pulses: 2+ and symmetric Skin: Skin color, texture, turgor normal. No rashes or lesions Neurologic: Grossly normal  Labs: CBC  Recent Labs  02/25/16 1553  WBC 6.4  RBC 4.18  HGB 12.4  HCT 36.6  PLT 316  MCV 87.6  MCH 29.7  MCHC 33.9  RDW 13.3   CMP   Recent Labs  02/25/16 1553  NA 138  K 3.5  CL 108  CO2 22  GLUCOSE 103*  BUN 9  CREATININE 0.78  CALCIUM 9.2  GFRNONAA >60  GFRAA >60    Troponin (Point of Care Test)  Recent Labs  02/25/16 1613  TROPIPOC 0.00   Cardiac Panel (last 3 results)  Recent Labs  02/25/16 2009 02/26/16 0035 02/26/16 0716  TROPONINI <0.03 <0.03 <0.03     Radiology:  No active cardiopulmonary disease  Stress Myoview-large reversible anterior defect  EKG: Normal  Signed:  W. Ashley RoyaltySpencer Henry Utsey, Jr. MD [facility]   Cardiology Consultant  02/26/2016, 4:21 PM

## 2016-02-26 NOTE — Discharge Instructions (Signed)
Follow-up Information    Schedule an appointment as soon as possible for a visit with [facility].   Contact information:   [address] [place] [address] [phone]

## 2016-02-27 ENCOUNTER — Encounter (HOSPITAL_COMMUNITY)
Admission: EM | Disposition: A | Discharge status: Home / Self Care | Payer: Self-pay | Source: Home / Self Care | Attending: Family Medicine

## 2016-02-27 DIAGNOSIS — E669 Obesity, unspecified: Secondary | ICD-10-CM

## 2016-02-27 DIAGNOSIS — R079 Chest pain, unspecified: Secondary | ICD-10-CM | POA: Insufficient documentation

## 2016-02-27 DIAGNOSIS — R9430 Abnormal result of cardiovascular function study, unspecified: Secondary | ICD-10-CM

## 2016-02-27 HISTORY — PX: CARDIAC CATHETERIZATION: SHX172

## 2016-02-27 LAB — PREGNANCY, URINE: Preg Test, Ur: NEGATIVE

## 2016-02-27 LAB — PROTIME-INR
INR: 1.05 (ref 0.00–1.49)
Prothrombin Time: 13.9 seconds (ref 11.6–15.2)

## 2016-02-27 SURGERY — LEFT HEART CATH AND CORONARY ANGIOGRAPHY

## 2016-02-27 MED ORDER — IOPAMIDOL (ISOVUE-370) INJECTION 76%
INTRAVENOUS | Status: DC | PRN
  Administered 2016-02-27: 50 mL via INTRA_ARTERIAL

## 2016-02-27 MED ORDER — ACETAMINOPHEN 325 MG PO TABS: 650.0000 mg | ORAL_TABLET | ORAL | Status: DC | PRN

## 2016-02-27 MED ORDER — MIDAZOLAM HCL 2 MG/2ML IJ SOLN
INTRAMUSCULAR | Status: AC
  Filled 2016-02-27: qty 2

## 2016-02-27 MED ORDER — SODIUM CHLORIDE 0.9 % WEIGHT BASED INFUSION: 1.0000 mL/kg/h | INTRAVENOUS | Status: DC

## 2016-02-27 MED ORDER — HEPARIN (PORCINE) IN NACL 2-0.9 UNIT/ML-% IJ SOLN
INTRAMUSCULAR | Status: AC
  Filled 2016-02-27: qty 1500

## 2016-02-27 MED ORDER — LIDOCAINE HCL (PF) 1 % IJ SOLN
INTRAMUSCULAR | Status: AC
  Filled 2016-02-27: qty 30

## 2016-02-27 MED ORDER — FENTANYL CITRATE (PF) 100 MCG/2ML IJ SOLN
INTRAMUSCULAR | Status: DC | PRN
  Administered 2016-02-27 (×2): 25 ug via INTRAVENOUS

## 2016-02-27 MED ORDER — FENTANYL CITRATE (PF) 100 MCG/2ML IJ SOLN
INTRAMUSCULAR | Status: AC
  Filled 2016-02-27: qty 2

## 2016-02-27 MED ORDER — IOPAMIDOL (ISOVUE-370) INJECTION 76%
INTRAVENOUS | Status: AC
  Filled 2016-02-27: qty 100

## 2016-02-27 MED ORDER — VERAPAMIL HCL 2.5 MG/ML IV SOLN
INTRAVENOUS | Status: DC | PRN
  Administered 2016-02-27: 10 mL via INTRA_ARTERIAL

## 2016-02-27 MED ORDER — MIDAZOLAM HCL 2 MG/2ML IJ SOLN
INTRAMUSCULAR | Status: DC | PRN
  Administered 2016-02-27: 2 mg via INTRAVENOUS
  Administered 2016-02-27: 1 mg via INTRAVENOUS

## 2016-02-27 MED ORDER — SODIUM CHLORIDE 0.9% FLUSH: 3.0000 mL | INTRAVENOUS | Status: DC | PRN

## 2016-02-27 MED ORDER — LIDOCAINE HCL (PF) 1 % IJ SOLN
INTRAMUSCULAR | Status: DC | PRN
  Administered 2016-02-27: 3 mL via INTRADERMAL

## 2016-02-27 MED ORDER — SODIUM CHLORIDE 0.9% FLUSH: 3.0000 mL | Freq: Two times a day (BID) | INTRAVENOUS | Status: DC

## 2016-02-27 MED ORDER — HEPARIN SODIUM (PORCINE) 1000 UNIT/ML IJ SOLN
INTRAMUSCULAR | Status: DC | PRN
  Administered 2016-02-27: 5000 [IU] via INTRAVENOUS

## 2016-02-27 MED ORDER — ONDANSETRON HCL 4 MG/2ML IJ SOLN: 4.0000 mg | Freq: Four times a day (QID) | INTRAMUSCULAR | Status: DC | PRN

## 2016-02-27 MED ORDER — HEPARIN (PORCINE) IN NACL 2-0.9 UNIT/ML-% IJ SOLN
INTRAMUSCULAR | Status: DC | PRN
  Administered 2016-02-27: 1500 mL

## 2016-02-27 MED ORDER — VERAPAMIL HCL 2.5 MG/ML IV SOLN
INTRAVENOUS | Status: AC
  Filled 2016-02-27: qty 2

## 2016-02-27 MED ORDER — SODIUM CHLORIDE 0.9 % IV SOLN: 250.0000 mL | INTRAVENOUS | Status: DC | PRN

## 2016-02-27 SURGICAL SUPPLY — 13 items
CATH INFINITI 5 FR JL3.5 (CATHETERS) ×2 IMPLANT
CATH INFINITI 5FR ANG PIGTAIL (CATHETERS) ×2 IMPLANT
CATH INFINITI JR4 5F (CATHETERS) ×2 IMPLANT
COVER PRB 48X5XTLSCP FOLD TPE (BAG) ×1 IMPLANT
COVER PROBE 5X48 (BAG) ×1
DEVICE RAD COMP TR BAND LRG (VASCULAR PRODUCTS) ×2 IMPLANT
GLIDESHEATH SLEND SS 6F .021 (SHEATH) ×2 IMPLANT
KIT HEART LEFT (KITS) ×2 IMPLANT
PACK CARDIAC CATHETERIZATION (CUSTOM PROCEDURE TRAY) ×2 IMPLANT
TRANSDUCER W/STOPCOCK (MISCELLANEOUS) ×2 IMPLANT
TUBING CIL FLEX 10 FLL-RA (TUBING) ×2 IMPLANT
WIRE HI TORQ VERSACORE-J 145CM (WIRE) ×2 IMPLANT
WIRE SAFE-T 1.5MM-J .035X260CM (WIRE) ×2 IMPLANT

## 2016-02-27 NOTE — H&P (View-Only) (Signed)
Cardiology Consult Note  Admit date: 02/25/2016 Name: Morgan Stevens 50 y.o.  female DOB:  06/15/1966 MRN:  [id number]  Today's date:  02/26/2016  Referring Physician:    Family Practice Service   Reason for Consultation:    Chest and neck pain, abnormal myocardial perfusion scan  IMPRESSIONS: 1.  Prolonged neck and chest discomfort of around 30 minutes associated with an abnormal myocardial perfusion scan suggestive of high-risk ischemia 2.  Obesity 3.  Reflux 4.  Borderline hyperlipidemia  RECOMMENDATION: In light of symptoms as well as high risk stress testing suggests cardiac catheterization.  In the absence of other risk factors there is a possibility that this could've represented a coronary dissection although her EKG is normal. Cardiac catheterization was discussed with the patient fully including risks of myocardial infarction, death, stroke, bleeding, arrhythmia, dye allergy, renal insufficiency or bleeding.  The patient understands and is willing to proceed.  Possibility of intervention at the same time also discussed with patient and they understand and are agreeable to proceed  HISTORY: This very nice 50-year-old black female is seen for evaluation of prolonged chest pain and abnormal stress test.  The patient has some intermittent chest pain through the years and has had some stress testing in the past.  She was hospitalized overnight in [place] in January with some chest discomfort and said that she was under stress but never followed up with cardiology.  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  Yesterday she was driving in an automobile and had the onset of left neck pain which then beginning associated with shortness of breath and midsternal chest discomfort with some radiation into her neck.  The pain lasted around 30 minutes and she came to the emergency room.  Serial troponins were negative and EKG was normal.  A Myoview test  done earlier today showed a large anterior reversible defect consistent with ischemia.  She is currently pain-free.  She has no premature family history of cardiac disease.  She has no known hyperlipidemia or hypertension.  She is not a diabetic.  She has been obese for a number of years.  Past Medical History  Diagnosis Date  . Thyroid disease   . Reflux   . Obesity (BMI 30-39.9)       Past Surgical History  Procedure Laterality Date  . Thyroidectomy    . Cholecystectomy    . Cesarean section      Allergies:  has No Known Allergies.   Medications: Prior to Admission medications   Medication Sig Start Date End Date Taking? Authorizing Provider  levothyroxine (SYNTHROID, LEVOTHROID) 137 MCG tablet Take 137 mcg by mouth daily before breakfast.   Yes Historical Provider, MD  cyclobenzaprine (FLEXERIL) 10 MG tablet Take 1 tablet (10 mg total) by mouth 3 (three) times daily as needed for muscle spasms. Patient not taking: Reported on 02/25/2016 01/20/16   Cari B Triplett, FNP  naproxen (NAPROSYN) 500 MG tablet Take 1 tablet (500 mg total) by mouth 2 (two) times daily with a meal. Patient not taking: Reported on 02/25/2016 01/20/16 01/19/17  Cari B Triplett, FNP   Family History: Family Status  Relation Status Death Age  . Father Deceased 63    prostate cancer  . Mother Deceased 81    CHF, pacer  . Brother Deceased     brain aneurysm  . Sister Alive   . Sister Alive   . Sister Alive   . Sister   Alive   . Sister Alive    Social History:   reports that she has never smoked. She has never used smokeless tobacco. She reports that she does not drink alcohol or use illicit drugs.   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  Other than as noted above the remainder of  the review of systems is unremarkable.  Physical Exam: BP 142/82 mmHg  Pulse 94  Temp(Src) 98.4 F (36.9 C) (Oral)  Resp 20  Ht 5\' 4"  (1.626 m)  Wt 99.791 kg (220 lb)  BMI 37.74 kg/m2  SpO2 100%  LMP 02/18/2016  General appearance: Pleasant mildly obese black female in no acute distress Head: Normocephalic, without obvious abnormality, atraumatic Eyes: conjunctivae/corneas clear. PERRL, EOM's intact. Fundi not examined  Neck: no adenopathy, no carotid bruit, no JVD, supple, symmetrical, trachea midline and Healed anterior thyroid scar Lungs: clear to auscultation bilaterally Heart: regular rate and rhythm, S1, S2 normal, no murmur, click, rub or gallop Abdomen: soft, non-tender; bowel sounds normal; no masses,  no organomegaly Pelvic: deferred Extremities: extremities normal, atraumatic, no cyanosis or edema Pulses: 2+ and symmetric Skin: Skin color, texture, turgor normal. No rashes or lesions Neurologic: Grossly normal  Labs: CBC  Recent Labs  02/25/16 1553  WBC 6.4  RBC 4.18  HGB 12.4  HCT 36.6  PLT 316  MCV 87.6  MCH 29.7  MCHC 33.9  RDW 13.3   CMP   Recent Labs  02/25/16 1553  NA 138  K 3.5  CL 108  CO2 22  GLUCOSE 103*  BUN 9  CREATININE 0.78  CALCIUM 9.2  GFRNONAA >60  GFRAA >60    Troponin (Point of Care Test)  Recent Labs  02/25/16 1613  TROPIPOC 0.00   Cardiac Panel (last 3 results)  Recent Labs  02/25/16 2009 02/26/16 0035 02/26/16 0716  TROPONINI <0.03 <0.03 <0.03     Radiology:  No active cardiopulmonary disease  Stress Myoview-large reversible anterior defect  EKG: Normal  Signed:  W. Ashley RoyaltySpencer Tilley, Jr. MD [facility]   Cardiology Consultant  02/26/2016, 4:21 PM

## 2016-02-27 NOTE — Progress Notes (Signed)
Subjective:  Cath reviewed and shows no significant obstructive disease.    Objective:  Vital Signs in the last 24 hours: BP 126/81 mmHg  Pulse 80  Temp(Src) 98.5 F (36.9 C) (Oral)  Resp 9  Ht 5\' 4"  (1.626 m)  Wt 97.614 kg (215 lb 3.2 oz)  BMI 36.92 kg/m2  SpO2 100%  LMP 02/18/2016  Intake/Output from previous day: 07/02 0701 - 07/03 0700 In: 120 [P.O.:120] Out: 1550 [Urine:1550]  Weight Filed Weights   02/26/16 0500 02/26/16 1656 02/27/16 0500  Weight: 99.791 kg (220 lb) 99.7 kg (219 lb 12.8 oz) 97.614 kg (215 lb 3.2 oz)    Lab Results: Basic Metabolic Panel:  Recent Labs  96/11/5405/01/17 1553  NA 138  K 3.5  CL 108  CO2 22  GLUCOSE 103*  BUN 9  CREATININE 0.78   CBC:  Recent Labs  02/25/16 1553  WBC 6.4  HGB 12.4  HCT 36.6  MCV 87.6  PLT 316   Cardiac Enzymes: Troponin (Point of Care Test)  Recent Labs  02/25/16 1613  TROPIPOC 0.00   Cardiac Panel (last 3 results)  Recent Labs  02/25/16 2009 02/26/16 0035 02/26/16 0716  TROPONINI <0.03 <0.03 <0.03   Assessment/Plan:  Has normal cath with no significant obstructive CAD.  Suspect false positive stress test.  OK for discharge to followup with her regular MD .     Darden PalmerW. Spencer Armend Hochstatter, Jr.  MD [facility] Cardiology  02/27/2016, 7:01 PM

## 2016-02-27 NOTE — Interval H&P Note (Signed)
Cath Lab Visit (complete for each Cath Lab visit)  Clinical Evaluation Leading to the Procedure:   ACS: No.  Non-ACS:    Anginal Classification: CCS III  Anti-ischemic medical therapy: Minimal Therapy (1 class of medications)  Non-Invasive Test Results: High-risk stress test findings: cardiac mortality >3%/year  Prior CABG: No previous CABG      History and Physical Interval Note:  02/27/2016 4:25 PM  Morgan Stevens  has presented today for surgery, with the diagnosis of unstable angina  The various methods of treatment have been discussed with the patient and family. After consideration of risks, benefits and other options for treatment, the patient has consented to  Procedure(s): Left Heart Cath (Right) as a surgical intervention .  The patient's history has been reviewed, patient examined, no change in status, stable for surgery.  I have reviewed the patient's chart and labs.  Questions were answered to the patient's satisfaction.     Lance MussJayadeep Julann Mcgilvray

## 2016-02-27 NOTE — Research (Signed)
[place] Study Informed Consent   Subject Name: Morgan Stevens  Subject met inclusion and exclusion criteria.  The informed consent form, study requirements and expectations were reviewed with the subject and questions and concerns were addressed prior to the signing of the consent form.  The subject verbalized understanding of the trial requirements.  The subject agreed to participate in the trial and signed the informed consent.  The informed consent was obtained prior to performance of any protocol-specific procedures for the subject.  A copy of the signed informed consent was given to the subject and a copy was placed in the subject's medical record.  Blossom Hoops 02/27/2016, 11:23 AM

## 2016-02-27 NOTE — Progress Notes (Signed)
Family Medicine Teaching Service Daily Progress Note Intern Pager: [phone]  Patient name: Morgan Stevens Medical record number: [id number] Date of birth: Jul 19, 1966 Age: 50 y.o. Gender: female  [facility] Consultants: None Code Status: Full  Pt Overview and Major Events to Date:  7/1- admitted for ACS rule out 7/3- we initially ruled out ACS and D/C was pending stress test and stress test was abnormal.  Pt NPO at midnight for morning cath 7/4-  Cath today   Assessment and Plan:  Morgan Stevens is a 50 y.o. female presenting with Neck pain and dyspnea that resolved with nitro x3.  Ultimately had abnormal myoview stress test  showing reversible ischemia. Planned cath for this AM. PMH is significant for hypothyroidism, obesity.  Neck pain / dyspnea. Symptoms resolved in ED. Patient had abnormal stress test revealing reversible ischemia.  Patient to get cath today.  - 324mg  ASA and nitroglycerin given in ED - Trend troponins q6hrs x3 negative - Repeat EKG negative for evidence if ischemia - Telemetry -  A1c pending, lipid panel and TSH largely wnl  Hypothyroidism - TSH 0.435 - Continue home levothyroxine  FEN/GI: NPO Prophylaxis: Lovenox  Disposition: Home, pending cardiology recs after cath.    Subjective:  Currently denies chest pain, SOB and aside from some anxiety from the planned cath this AM, she feels otherwise well.    Objective: Temp:  [98.4 F (36.9 C)-98.7 F (37.1 C)] 98.5 F (36.9 C) (07/03 1413) Pulse Rate:  [65-95] 69 (07/03 1413) Resp:  [14-21] 18 (07/03 1413) BP: (114-146)/(68-102) 115/85 mmHg (07/03 1413) SpO2:  [97 %-100 %] 100 % (07/03 1413) Weight:  [215 lb 3.2 oz (97.614 kg)-219 lb 12.8 oz (99.7 kg)] 215 lb 3.2 oz (97.614 kg) (07/03 0500) Physical Exam: General: well-appearing middle aged woman in NAD Eyes: PERRL, EOMI Cardiovascular: regular rate and rhythm, s1/s2 present, no MRG Respiratory: NWOB,  CTAB Abdomen: obese, soft, non tender, non-distended Extremities: no LE edema, warm and well perfused, no cyanosis Neuro: AAO, no focal deficits Psych: normal mood and affect  Laboratory:  Recent Labs Lab 02/25/16 1553  WBC 6.4  HGB 12.4  HCT 36.6  PLT 316    Recent Labs Lab 02/25/16 1553  NA 138  K 3.5  CL 108  CO2 22  BUN 9  CREATININE 0.78  CALCIUM 9.2  GLUCOSE 103*    Daniel L. Myrtie SomanWarden, MD 02/27/2016, 2:35 PM PGY-1, [facility] FPTS Intern pager: [phone], text pages welcome

## 2016-02-27 NOTE — Progress Notes (Signed)
TR band removed. No complications. Pt discharged home. Discharge instructions have been gone over with the patient. IV's removed. Pt given unit number and told to call if they have any concerns regarding their discharge instructions. Cecille Rubinhompson,Jenevieve Kirschbaum V, RN

## 2016-02-27 NOTE — Care Management Note (Signed)
Case Management Note  Patient Details  Name: Huel CoventryRuth Klink MRN: [id number] Date of Birth: 1966-03-19  Subjective/Objective:    Pt admitted with angina pectoris                Action/Plan:  PTA independent from home.  [facility] and has [facility]CP with clinic.  Pt states she is able to afford current medications - no hardship.  CM will continue to monitor for disposition needs    Expected Discharge Date:                  Expected Discharge Plan:  Home/Self Care  In-House Referral:     Discharge planning Services  CM Consult  Post Acute Care Choice:    Choice offered to:     DME Arranged:    DME Agency:     HH Arranged:    HH Agency:     Status of Service:  In process, will continue to follow  If discussed at Long Length of Stay Meetings, dates discussed:    Additional Comments:  Cherylann ParrClaxton, Renda Pohlman S, RN 02/27/2016, 10:51 AM

## 2016-02-28 LAB — HEMOGLOBIN A1C
Hgb A1c MFr Bld: 5.2 % (ref 4.8–5.6)
Mean Plasma Glucose: 103 mg/dL

## 2016-02-29 ENCOUNTER — Encounter (HOSPITAL_COMMUNITY): Payer: Self-pay | Admitting: Interventional Cardiology

## 2016-03-01 NOTE — Discharge Summary (Signed)
Physician Discharge Summary  Patient ID: Morgan Stevens MRN: [id number] DOB/AGE: 02-Dec-1965 50 y.o.  Admit date: 02/25/2016 Discharge date: 03/01/2016  Admission Diagnoses: Angina pectoris, dyspnea, hypothyroidism, obesity, GERD  Discharge Diagnoses:  Active Problems:   Angina pectoris (HCC)   Dyspnea   Hypothyroidism   Obesity (BMI 30-39.9)   Abnormal cardiac function test   Pain in the chest   Discharged Condition:  Stable  Hospital Course: Morgan Stevens is a 50 y.o. female presenting with Neck pain and dyspnea . PMH is significant for hypothyroidism and obesity. Her neck pain resolved with nitroglycerin and aspirin. Her EKGs were negative for evidence of ischemia x2. Her troponins were negative x3. Initially she was discharged from our service, but had an abnormal stress test, which prompted a cardiac catheterization.  This was normal and showed no significant obstructive CAD. Cardiology suspects that this was a false positive stress test. Following this result, we and Cardiology felt comfortable discharging you from the hospital.   Consults: Cardiology  Significant Diagnostic Studies: Myoview stress test, cardiac catheterization  Discharge Exam: Blood pressure 141/82, pulse 83, temperature 98.5 F (36.9 C), temperature source Oral, resp. rate 14, height 5\' 4"  (1.626 m), weight 215 lb 3.2 oz (97.614 kg), last menstrual period 02/18/2016, SpO2 100 %.  Physical exam:  General: NAD Cardiovascular: RRR, s1/s2 present, no MRG Respiratory: CTABL Abdominal: obese, soft, non-tender, non-distended Extremities: warm and well perfused  Disposition: 01-Home or Self Care     Medication List    TAKE these medications        cyclobenzaprine 10 MG tablet  Commonly known as:  FLEXERIL  Take 1 tablet (10 mg total) by mouth 3 (three) times daily as needed for muscle spasms.     levothyroxine 137 MCG tablet  Commonly known as:  SYNTHROID, LEVOTHROID  Take 137 mcg by mouth daily before  breakfast.     naproxen 500 MG tablet  Commonly known as:  NAPROSYN  Take 1 tablet (500 mg total) by mouth 2 (two) times daily with a meal.           Follow-up Information    Schedule an appointment as soon as possible [facility] a [facility].   Contact information:   [address] [place] [id number] [phone]       Signed: Renne MuscaDaniel L Dushaun Okey 03/01/2016, 5:19 PM

## 2016-04-11 ENCOUNTER — Emergency Department: Payer: Self-pay

## 2016-04-11 ENCOUNTER — Emergency Department
Admission: EM | Admit: 2016-04-11 | Discharge: 2016-04-11 | Disposition: A | Discharge status: Home / Self Care | Payer: Self-pay | Attending: Emergency Medicine | Admitting: Emergency Medicine

## 2016-04-11 ENCOUNTER — Encounter: Payer: Self-pay | Admitting: *Deleted

## 2016-04-11 DIAGNOSIS — R05 Cough: Secondary | ICD-10-CM | POA: Insufficient documentation

## 2016-04-11 DIAGNOSIS — E039 Hypothyroidism, unspecified: Secondary | ICD-10-CM | POA: Insufficient documentation

## 2016-04-11 DIAGNOSIS — M79605 Pain in left leg: Secondary | ICD-10-CM | POA: Insufficient documentation

## 2016-04-11 LAB — CBC
HCT: 35.4 % (ref 35.0–47.0)
Hemoglobin: 12.4 g/dL (ref 12.0–16.0)
MCH: 30.1 pg (ref 26.0–34.0)
MCHC: 34.9 g/dL (ref 32.0–36.0)
MCV: 86.3 fL (ref 80.0–100.0)
Platelets: 235 10*3/uL (ref 150–440)
RBC: 4.1 MIL/uL (ref 3.80–5.20)
RDW: 13.5 % (ref 11.5–14.5)
WBC: 7 10*3/uL (ref 3.6–11.0)

## 2016-04-11 LAB — BASIC METABOLIC PANEL
Anion gap: 6 (ref 5–15)
BUN: 8 mg/dL (ref 6–20)
CO2: 26 mmol/L (ref 22–32)
Calcium: 8.9 mg/dL (ref 8.9–10.3)
Chloride: 107 mmol/L (ref 101–111)
Creatinine, Ser: 0.65 mg/dL (ref 0.44–1.00)
GFR calc Af Amer: >60 mL/min (ref >60)
GFR calc non Af Amer: >60 mL/min (ref >60)
Glucose, Bld: 97 mg/dL (ref 65–99)
Potassium: 3.8 mmol/L (ref 3.5–5.1)
Sodium: 139 mmol/L (ref 135–145)

## 2016-04-11 MED ORDER — NAPROXEN 500 MG PO TABS: 500.0000 mg | ORAL_TABLET | Freq: Two times a day (BID) | ORAL | 0 refills | Status: DC

## 2016-04-11 NOTE — ED Notes (Signed)
States she noticed some swelling to left ankle/foot yesterday  W/o injury  Today noted to the pain moved up to calf.  No redness noted   Min swelling noted

## 2016-04-11 NOTE — ED Notes (Signed)
Pulses present equal and strong bilaterally.

## 2016-04-11 NOTE — ED Triage Notes (Signed)
Patient presents to the ED with left leg pain and swelling.  Patient is in no obvious distress at this time.  Patient reports noticing swelling to left leg yesterday and then pain to left leg today.  Patient ambulatory to triage, no obvious distress at this time.

## 2016-04-11 NOTE — ED Provider Notes (Signed)
[facility] Emergency Department Provider Note  ____________________________________________  Time seen: Approximately 4:59 PM  I have reviewed the triage vital signs and the nursing notes.   HISTORY  Chief Complaint Leg Pain    HPI Morgan Stevens is a 50 y.o. female,NAD, presents to the emergency department for evaluation of left lower leg swelling with numbness and tingling. Patient states that two days ago she noticed her left calf and ankle was slightly swollen. Had onset of numbness and tingling in her feet yesterday. Also has noted a rare dry cough without shortness of breath nor chest pain. Has not taken anything OTC for her symptoms. Patient denies recent surgery, prolonged travel, familial clotting disorders, or estrogen-based medications. Is a non smoker. Has had no abdominal pain, nausea or vomiting. No injuries, traumas or falls. Has noted no skin sores, redness, or abnormal warmth to the skin.    Past Medical History:  Diagnosis Date  . Obesity (BMI 30-39.9)   . Reflux   . Thyroid disease     Patient Active Problem List   Diagnosis Date Noted  . Pain in the chest   . Abnormal cardiac function test 02/26/2016  . Obesity (BMI 30-39.9)   . Angina pectoris (HCC) 02/25/2016  . Dyspnea 02/25/2016  . Hypothyroidism 02/25/2016  . Chest pain 09/07/2015    Past Surgical History:  Procedure Laterality Date  . CARDIAC CATHETERIZATION  02/27/2016   Procedure: Left Heart Cath and Coronary Angiography;  Surgeon: Corky CraftsJayadeep S Varanasi, MD;  [facility];  Service: Cardiovascular;;  . CESAREAN SECTION    . CHOLECYSTECTOMY    . THYROIDECTOMY      Prior to Admission medications   Medication Sig Start Date End Date Taking? Authorizing Provider  levothyroxine (SYNTHROID, LEVOTHROID) 137 MCG tablet Take 137 mcg by mouth daily before breakfast.    Historical Provider, MD  naproxen (NAPROSYN) 500 MG tablet Take 1 tablet (500 mg total) by mouth 2  (two) times daily with a meal. 04/11/16   Fisher Hargadon L Martavious Hartel, PA-C    Allergies Review of patient's allergies indicates no known allergies.  History reviewed. No pertinent family history.  Social History Social History  Substance Use Topics  . Smoking status: Never Smoker  . Smokeless tobacco: Never Used  . Alcohol use No     Review of Systems  Constitutional: No fever/chills, fatigue Cardiovascular: No chest pain. Respiratory: Positive dry, nonproductive cough. No shortness of breath. No wheezing.  Gastrointestinal: No abdominal pain.  No nausea, vomiting.   Musculoskeletal: Positive left lower leg pain. Negative for back pain.  Skin: Positive swelling left lower leg. Negative for rash, redness, skin sores, bruising. Neurological: Positive tingling left lower leg. Negative for headaches, focal weakness or numbness. 10-point ROS otherwise negative.  ____________________________________________   PHYSICAL EXAM:  VITAL SIGNS: ED Triage Vitals  Enc Vitals Group     BP 04/11/16 1626 (!) 145/83     Pulse Rate 04/11/16 1626 81     Resp 04/11/16 1626 16     Temp 04/11/16 1626 98.5 F (36.9 C)     Temp Source 04/11/16 1626 Oral     SpO2 04/11/16 1626 100 %     Weight 04/11/16 1629 217 lb (98.4 kg)     Height 04/11/16 1629 5\' 4"  (1.626 m)     Head Circumference --      Peak Flow --      Pain Score 04/11/16 1629 6     Pain Loc --  Pain Edu? --      Excl. in GC? --      Constitutional: Alert and oriented. Well appearing and in no acute distress. Eyes: Conjunctivae are normal without icterus or injection  Head: Atraumatic. Neck: Supple with FROM Hematological/Lymphatic/Immunilogical: No cervical lymphadenopathy. Cardiovascular: Normal rate, regular rhythm. Normal S1 and S2.  Good peripheral circulation with 1+ pulses in bilateral lower extremities. Capillary refill is brisk in all digits of the left foot. Respiratory: Normal respiratory effort without tachypnea or  retractions. Lungs CTAB with breath sounds noted in all lung fields. No wheeze, rhonchi, rales. Musculoskeletal: Trace swelling noted about the ankle but no pain or edema to palpation. FROM of the left lower extremity in all joints without pain or difficulty. No lower extremity tenderness nor edema.  No joint effusions. Neurologic:  Normal speech and language. No gross focal neurologic deficits are appreciated.  Skin:  Skin is warm, dry and intact. No rash, redness, bruising, skin sores noted. Psychiatric: Mood and affect are normal. Speech and behavior are normal. Patient exhibits appropriate insight and judgement.   ____________________________________________   LABS (all labs ordered are listed, but only abnormal results are displayed)  Labs Reviewed  BASIC METABOLIC PANEL  CBC   ____________________________________________  EKG  None ____________________________________________  RADIOLOGY I have personally viewed and evaluated these images (plain radiographs) as part of my medical decision making, as well as reviewing the written report by the radiologist.  US Venous Img Lower Unilateral Left  Result Date: 04/11/2016 CLINICAL DATA:  Acute onset of left lower extremity pain and swelling. Tingling. Initial encounter. EXAM: LEFT LOWER EXTREMITY VENOUS DOPPLER ULTRASOUND TECHNIQUE: Gray-scale sonography with graded compression, as well as color Doppler and duplex ultrasound were performed to evaluate the lower extremity deep venous systems from the level of the common femoral vein and including the common femoral, femoral, profunda femoral, popliteal and calf veins including the posterior tibial, peroneal and gastrocnemius veins when visible. The superficial great saphenous vein was also interrogated. Spectral Doppler was utilized to evaluate flow at rest and with distal augmentation maneuvers in the common femoral, femoral and popliteal veins. COMPARISON:  None. FINDINGS: Contralateral  Common Femoral Vein: Respiratory phasicity is normal and symmetric with the symptomatic side. No evidence of thrombus. Normal compressibility. Common Femoral Vein: No evidence of thrombus. Normal compressibility, respiratory phasicity and response to augmentation. Saphenofemoral Junction: No evidence of thrombus. Normal compressibility and flow on color Doppler imaging. Profunda Femoral Vein: No evidence of thrombus. Normal compressibility and flow on color Doppler imaging. Femoral Vein: No evidence of thrombus. Normal compressibility, respiratory phasicity and response to augmentation. Popliteal Vein: No evidence of thrombus. Normal compressibility, respiratory phasicity and response to augmentation. Calf Veins: No evidence of thrombus. Normal compressibility and flow on color Doppler imaging. Superficial Great Saphenous Vein: No evidence of thrombus. Normal compressibility and flow on color Doppler imaging. Venous Reflux:  None. Other Findings:  None. IMPRESSION: No evidence of deep venous thrombosis. Electronically Signed   By: Roanna Raider M.D.   On: 04/11/2016 18:31    ____________________________________________    PROCEDURES  Procedure(s) performed: None   Procedures   Medications - No data to display   ____________________________________________   INITIAL IMPRESSION / ASSESSMENT AND PLAN / ED COURSE  Pertinent labs & imaging results that were available during my care of the patient were reviewed by me and considered in my medical decision making (see chart for details).  Clinical Course    Patient's diagnosis is consistent with left leg  pain. Patient will be discharged home with prescriptions for naproxen to take as directed. Advise elevating the affected area when not ambulating. Patient is to [facility] if symptoms persist past this treatment course. Patient is given ED precautions to return to the ED for any worsening or new symptoms.     ____________________________________________  FINAL CLINICAL IMPRESSION(S) / ED DIAGNOSES  Final diagnoses:  Left leg pain      NEW MEDICATIONS STARTED DURING THIS VISIT:  Discharge Medication List as of 04/11/2016  6:53 PM           Hope PigeonJami L Wyolene Weimann, PA-C 04/11/16 1932    Arnaldo NatalPaul F Malinda, MD 04/12/16 [phone]

## 2016-04-13 ENCOUNTER — Emergency Department
Admission: EM | Admit: 2016-04-13 | Discharge: 2016-04-13 | Disposition: A | Discharge status: Home / Self Care | Payer: Self-pay | Attending: Emergency Medicine | Admitting: Emergency Medicine

## 2016-04-13 ENCOUNTER — Encounter: Payer: Self-pay | Admitting: Emergency Medicine

## 2016-04-13 ENCOUNTER — Emergency Department: Payer: Self-pay

## 2016-04-13 DIAGNOSIS — E039 Hypothyroidism, unspecified: Secondary | ICD-10-CM | POA: Insufficient documentation

## 2016-04-13 DIAGNOSIS — R0789 Other chest pain: Secondary | ICD-10-CM | POA: Insufficient documentation

## 2016-04-13 LAB — CBC
HCT: 37.6 % (ref 35.0–47.0)
Hemoglobin: 13.2 g/dL (ref 12.0–16.0)
MCH: 30.5 pg (ref 26.0–34.0)
MCHC: 35.2 g/dL (ref 32.0–36.0)
MCV: 86.6 fL (ref 80.0–100.0)
Platelets: 236 10*3/uL (ref 150–440)
RBC: 4.33 MIL/uL (ref 3.80–5.20)
RDW: 13.6 % (ref 11.5–14.5)
WBC: 6.2 10*3/uL (ref 3.6–11.0)

## 2016-04-13 LAB — BASIC METABOLIC PANEL
Anion gap: 6 (ref 5–15)
BUN: 7 mg/dL (ref 6–20)
CO2: 25 mmol/L (ref 22–32)
Calcium: 9 mg/dL (ref 8.9–10.3)
Chloride: 107 mmol/L (ref 101–111)
Creatinine, Ser: 0.71 mg/dL (ref 0.44–1.00)
GFR calc Af Amer: >60 mL/min (ref >60)
GFR calc non Af Amer: >60 mL/min (ref >60)
Glucose, Bld: 126 mg/dL — ABNORMAL HIGH (ref 65–99)
Potassium: 3.4 mmol/L — ABNORMAL LOW (ref 3.5–5.1)
Sodium: 138 mmol/L (ref 135–145)

## 2016-04-13 LAB — TROPONIN I: Troponin I: <0.03 ng/mL (ref <0.03)

## 2016-04-13 IMAGING — CR DG CHEST 2V
2 series · 2 of 2 positions shown · non-contrast
Comparison: [DATE]

CLINICAL DATA: Pt c/o midsternal chest pain x 2
hours/nausea/dizziness, no heart or lung hx, no prev surg, nonsmoker

EXAM:
CHEST  2 VIEW

[chest pa]
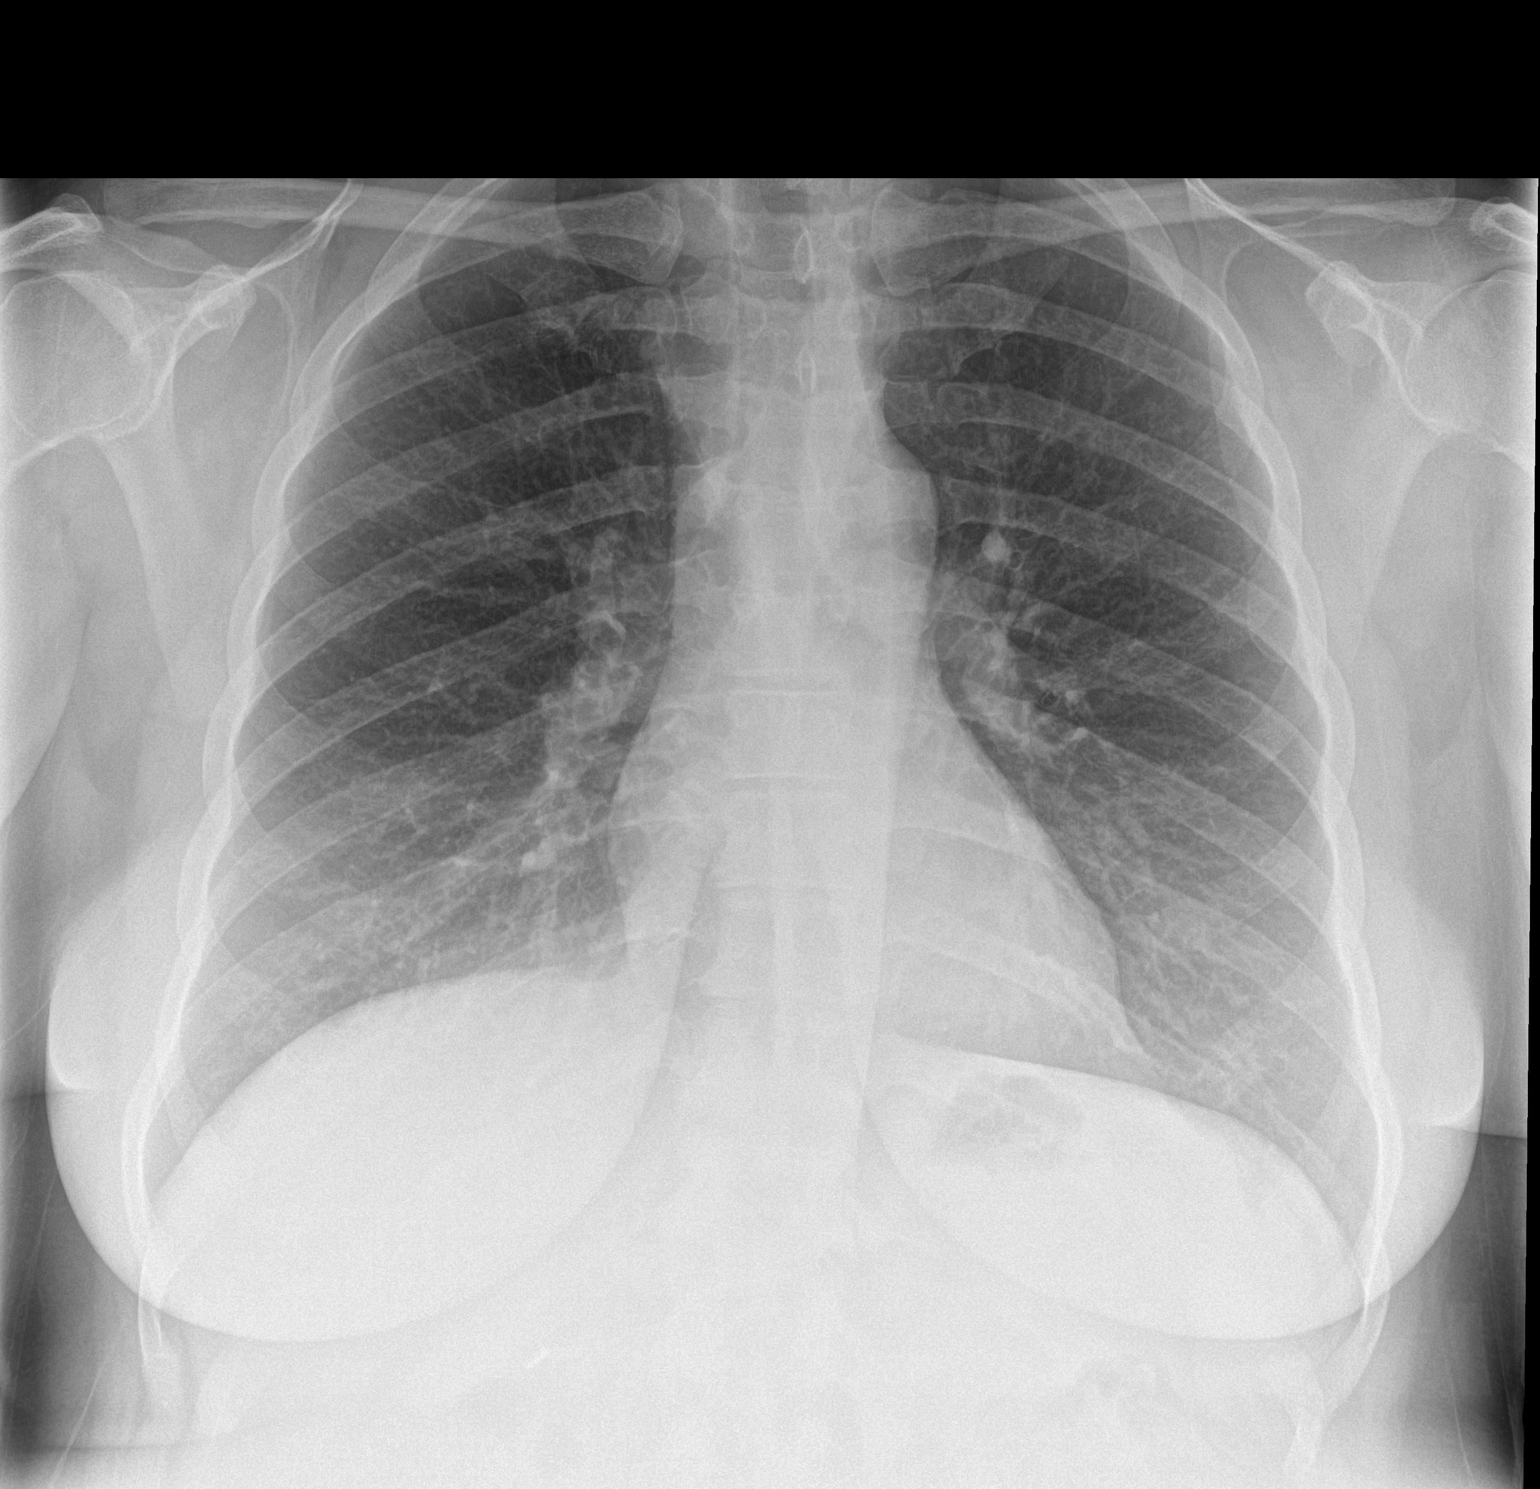

[chest lat]
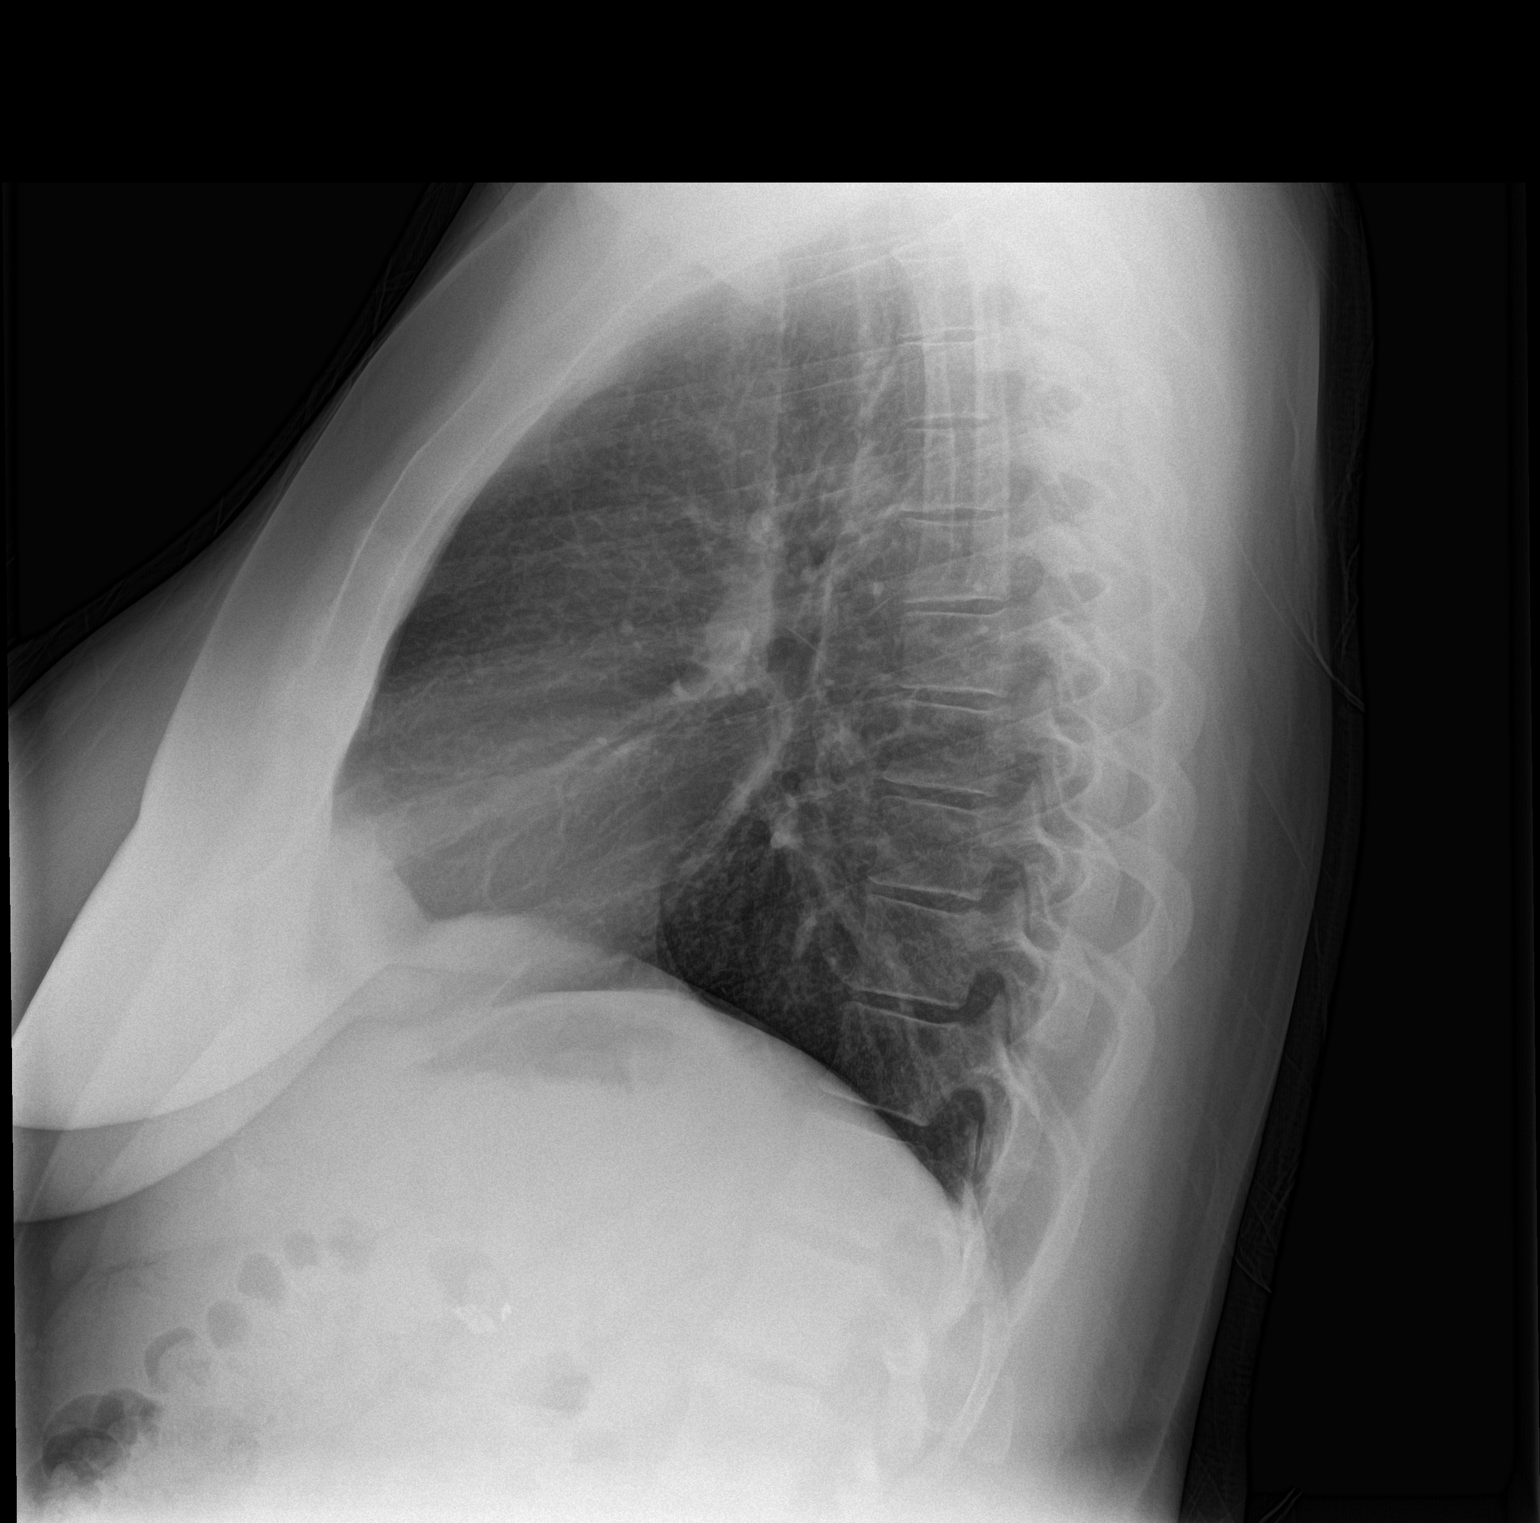

[2 of 2 positions shown; findings below may reference images not displayed]

FINDINGS: The heart size and mediastinal contours are within normal limits.
Both lungs are clear. No pleural effusion or pneumothorax. The
visualized skeletal structures are unremarkable.
IMPRESSION: No active cardiopulmonary disease.

## 2016-04-13 MED ORDER — ASPIRIN 81 MG PO CHEW
324.0000 mg | CHEWABLE_TABLET | Freq: Once | ORAL | Status: AC
  Administered 2016-04-13: 324 mg via ORAL

## 2016-04-13 MED ORDER — ASPIRIN 81 MG PO CHEW
CHEWABLE_TABLET | ORAL | Status: AC
  Administered 2016-04-13: 324 mg via ORAL
  Filled 2016-04-13: qty 4

## 2016-04-13 NOTE — ED Notes (Signed)
Pt states currently no pain but when pain occurred it was central and non radiating. Pt states she was dizzy and felt nauseous. Pt denies vomiting.

## 2016-04-13 NOTE — ED Triage Notes (Signed)
Pt c/o mid chest pain that started 1 hr ago. Denies SHOB or sweating. Has had some nausea. NAD. Respirations unlabored.

## 2016-04-13 NOTE — Discharge Instructions (Signed)

## 2016-04-13 NOTE — ED Provider Notes (Signed)
[facility] Emergency Department Provider Note  ____________________________________________   First MD Initiated Contact with Patient 04/13/16 1656     (approximate)  I have reviewed the triage vital signs and the nursing notes.   HISTORY  Chief Complaint Chest Pain    HPI Morgan Stevens is a 50 y.o. female who presents for evaluation of acute onset chest pain and pressure.  She reports that it occurred without a particular stimulus.  It was severe but completely resolved within a few minutes.  She describes it as both sharp and pressure.  She denies fever/chills, shortness of breath, abdominal pain, nausea, vomiting, diaphoresis.  She is pain-free upon arrival in the emergency department.  She has had pain like this in the past.  In fact [facility] month and had a cardiac catheterization which is completely normal.  Apparently she had had an abnormal stress test but the cardiologist felt that this was a false positive stress test because of how normal her catheterization was.  She has no significant past medical history other than hypothyroidism as a result of a thyroidectomy secondary to goiter.  She has not had any changes of her medications recently.   Past Medical History:  Diagnosis Date  . Obesity (BMI 30-39.9)   . Reflux   . Thyroid disease     Patient Active Problem List   Diagnosis Date Noted  . Pain in the chest   . Abnormal cardiac function test 02/26/2016  . Obesity (BMI 30-39.9)   . Angina pectoris (HCC) 02/25/2016  . Dyspnea 02/25/2016  . Hypothyroidism 02/25/2016  . Chest pain 09/07/2015    Past Surgical History:  Procedure Laterality Date  . CARDIAC CATHETERIZATION  02/27/2016   Procedure: Left Heart Cath and Coronary Angiography;  Surgeon: Corky CraftsJayadeep S Varanasi, MD;  [facility];  Service: Cardiovascular;;  . CESAREAN SECTION    . CHOLECYSTECTOMY    . THYROIDECTOMY      Prior to  Admission medications   Medication Sig Start Date End Date Taking? Authorizing Provider  levothyroxine (SYNTHROID, LEVOTHROID) 137 MCG tablet Take 137 mcg by mouth daily before breakfast.    Historical Provider, MD  naproxen (NAPROSYN) 500 MG tablet Take 1 tablet (500 mg total) by mouth 2 (two) times daily with a meal. 04/11/16   Jami L Hagler, PA-C    Allergies Review of patient's allergies indicates no known allergies.  History reviewed. No pertinent family history.  Social History Social History  Substance Use Topics  . Smoking status: Never Smoker  . Smokeless tobacco: Never Used  . Alcohol use No    Review of Systems Constitutional: No fever/chills Eyes: No visual changes. ENT: No sore throat. Cardiovascular: +chest pain. Respiratory: Denies shortness of breath. Gastrointestinal: No abdominal pain.  No nausea, no vomiting.  No diarrhea.  No constipation. Genitourinary: Negative for dysuria. Musculoskeletal: Negative for back pain. Skin: Negative for rash. Neurological: Negative for headaches, focal weakness or numbness.  10-point ROS otherwise negative.  ____________________________________________   PHYSICAL EXAM:  VITAL SIGNS: ED Triage Vitals  Enc Vitals Group     BP 04/13/16 1510 131/82     Pulse Rate 04/13/16 1510 78     Resp 04/13/16 1510 18     Temp 04/13/16 1510 98.4 F (36.9 C)     Temp Source 04/13/16 1510 Oral     SpO2 04/13/16 1510 100 %     Weight 04/13/16 1510 217 lb (98.4 kg)  Height 04/13/16 1510 5\' 4"  (1.626 m)     Head Circumference --      Peak Flow --      Pain Score 04/13/16 1511 8     Pain Loc --      Pain Edu? --      Excl. in GC? --     Constitutional: Alert and oriented. Well appearing and in no acute distress. Eyes: Conjunctivae are normal. PERRL. EOMI. Head: Atraumatic. Nose: No congestion/rhinnorhea. Mouth/Throat: Mucous membranes are moist.  Oropharynx non-erythematous. Neck: No stridor.  No meningeal signs.     Cardiovascular: Normal rate, regular rhythm. Good peripheral circulation. Grossly normal heart sounds.  Respiratory: Normal respiratory effort.  No retractions. Lungs CTAB. Gastrointestinal: Soft and nontender. No distention.  Musculoskeletal: No lower extremity tenderness nor edema. No gross deformities of extremities. Neurologic:  Normal speech and language. No gross focal neurologic deficits are appreciated.  Skin:  Skin is warm, dry and intact. No rash noted. Psychiatric: Mood and affect are normal. Speech and behavior are normal.  ____________________________________________   LABS (all labs ordered are listed, but only abnormal results are displayed)  Labs Reviewed  BASIC METABOLIC PANEL - Abnormal; Notable for the following:       Result Value   Potassium 3.4 (*)    Glucose, Bld 126 (*)    All other components within normal limits  CBC  TROPONIN I   ____________________________________________  EKG  ED ECG REPORT I, Britteny Fiebelkorn, the attending physician, personally viewed and interpreted this ECG.  Date: 04/13/2016 EKG Time: 15:08 Rate: 72 Rhythm: normal sinus rhythm QRS Axis: normal Intervals: normal ST/T Wave abnormalities: normal Conduction Disturbances: none Narrative Interpretation: unremarkable  ____________________________________________  RADIOLOGY   Dg Chest 2 View  Result Date: 04/13/2016 CLINICAL DATA:  Pt c/o midsternal chest pain x 2 hours/nausea/dizziness, no heart or lung hx, no prev surg, nonsmoker EXAM: CHEST  2 VIEW COMPARISON:  02/25/2016 FINDINGS: The heart size and mediastinal contours are within normal limits. Both lungs are clear. No pleural effusion or pneumothorax. The visualized skeletal structures are unremarkable. IMPRESSION: No active cardiopulmonary disease. Electronically Signed   By: Amie Portland M.D.   On: 04/13/2016 16:24    ____________________________________________   PROCEDURES  Procedure(s) performed:    Procedures   Critical Care performed: No ____________________________________________   INITIAL IMPRESSION / ASSESSMENT AND PLAN / ED COURSE  Pertinent labs & imaging results that were available during my care of the patient were reviewed by me and considered in my medical decision making (see chart for details).      Pulmonary Embolism Rule-out Criteria (PERC rule)                        If YES to ANY of the following, the PERC rule is not satisfied and cannot be used to rule out PE in this patient (consider d-dimer or imaging depending on pre-test probability).                      If NO to ALL of the following, AND the clinician's pre-test probability is <15%, the [facility] rule is satisfied and there is no need for further workup (including no need to obtain a d-dimer) as the post-test probability of pulmonary embolism is <2%.                      Mnemonic is HAD CLOTS   H - hormone use (exogenous  estrogen)      No. A - age > 50                                                 No. D - DVT/PE history                                      No.   C - coughing blood (hemoptysis)                 No. L - leg swelling, unilateral                             No. O - O2 Sat on Room Air < 95%                  No. T - tachycardia (HR ? 100)                         No. S - surgery or trauma, recent                      No.   Based on my evaluation of the patient, including application of this decision instrument, further testing to evaluate for pulmonary embolism is not indicated at this time. I have discussed this recommendation with the patient who states understanding and agreement with this plan.  Given that the patient had a cardiac catheterization within the last month which was completely reassuring (confirmed in the electronic medical record), the patient is at very low risk for ACS.  I encouraged her to follow up with her primary care doctor which she has not done since leaving the  hospital last month.  I also encouraged her to take a daily baby aspirin and I gave her a full dose aspirin in the ED.  I gave my usual and customary return precautions.     ____________________________________________  FINAL CLINICAL IMPRESSION(S) / ED DIAGNOSES  Final diagnoses:  Atypical chest pain     MEDICATIONS GIVEN DURING THIS VISIT:  Medications  aspirin chewable tablet 324 mg (324 mg Oral Given 04/13/16 1709)     NEW OUTPATIENT MEDICATIONS STARTED DURING THIS VISIT:  New Prescriptions   No medications on file      Note:  This document was prepared using Dragon voice recognition software and may include unintentional dictation errors.    Loleta Roseory Toccara Alford, MD 04/13/16 [phone]

## 2016-05-08 ENCOUNTER — Encounter: Payer: Self-pay | Admitting: Emergency Medicine

## 2016-05-08 ENCOUNTER — Emergency Department
Admission: EM | Admit: 2016-05-08 | Discharge: 2016-05-08 | Disposition: A | Discharge status: Home / Self Care | Payer: Self-pay | Attending: Emergency Medicine | Admitting: Emergency Medicine

## 2016-05-08 ENCOUNTER — Emergency Department: Payer: Self-pay

## 2016-05-08 DIAGNOSIS — R079 Chest pain, unspecified: Secondary | ICD-10-CM | POA: Insufficient documentation

## 2016-05-08 DIAGNOSIS — Z79899 Other long term (current) drug therapy: Secondary | ICD-10-CM | POA: Insufficient documentation

## 2016-05-08 DIAGNOSIS — E039 Hypothyroidism, unspecified: Secondary | ICD-10-CM | POA: Insufficient documentation

## 2016-05-08 LAB — TROPONIN I
Troponin I: <0.03 ng/mL (ref <0.03)
Troponin I: <0.03 ng/mL (ref <0.03)

## 2016-05-08 LAB — BASIC METABOLIC PANEL
Anion gap: 7 (ref 5–15)
BUN: 9 mg/dL (ref 6–20)
CO2: 25 mmol/L (ref 22–32)
Calcium: 9.5 mg/dL (ref 8.9–10.3)
Chloride: 106 mmol/L (ref 101–111)
Creatinine, Ser: 0.7 mg/dL (ref 0.44–1.00)
GFR calc Af Amer: >60 mL/min (ref >60)
GFR calc non Af Amer: >60 mL/min (ref >60)
Glucose, Bld: 105 mg/dL — ABNORMAL HIGH (ref 65–99)
Potassium: 3.5 mmol/L (ref 3.5–5.1)
Sodium: 138 mmol/L (ref 135–145)

## 2016-05-08 LAB — CBC
HCT: 34.7 % — ABNORMAL LOW (ref 35.0–47.0)
Hemoglobin: 12.3 g/dL (ref 12.0–16.0)
MCH: 30.4 pg (ref 26.0–34.0)
MCHC: 35.6 g/dL (ref 32.0–36.0)
MCV: 85.5 fL (ref 80.0–100.0)
Platelets: 284 10*3/uL (ref 150–440)
RBC: 4.06 MIL/uL (ref 3.80–5.20)
RDW: 13.8 % (ref 11.5–14.5)
WBC: 7.6 10*3/uL (ref 3.6–11.0)

## 2016-05-08 IMAGING — CR DG CHEST 2V
1 series · 2 of 2 positions shown · non-contrast
Comparison: [DATE]

CLINICAL DATA: Sudden onset chest pain around [DATE].

EXAM:
CHEST  2 VIEW

[Series 1: dg chest 2 view · 0.14mm/px · 2 of 2 slices shown]
[im 1/2]
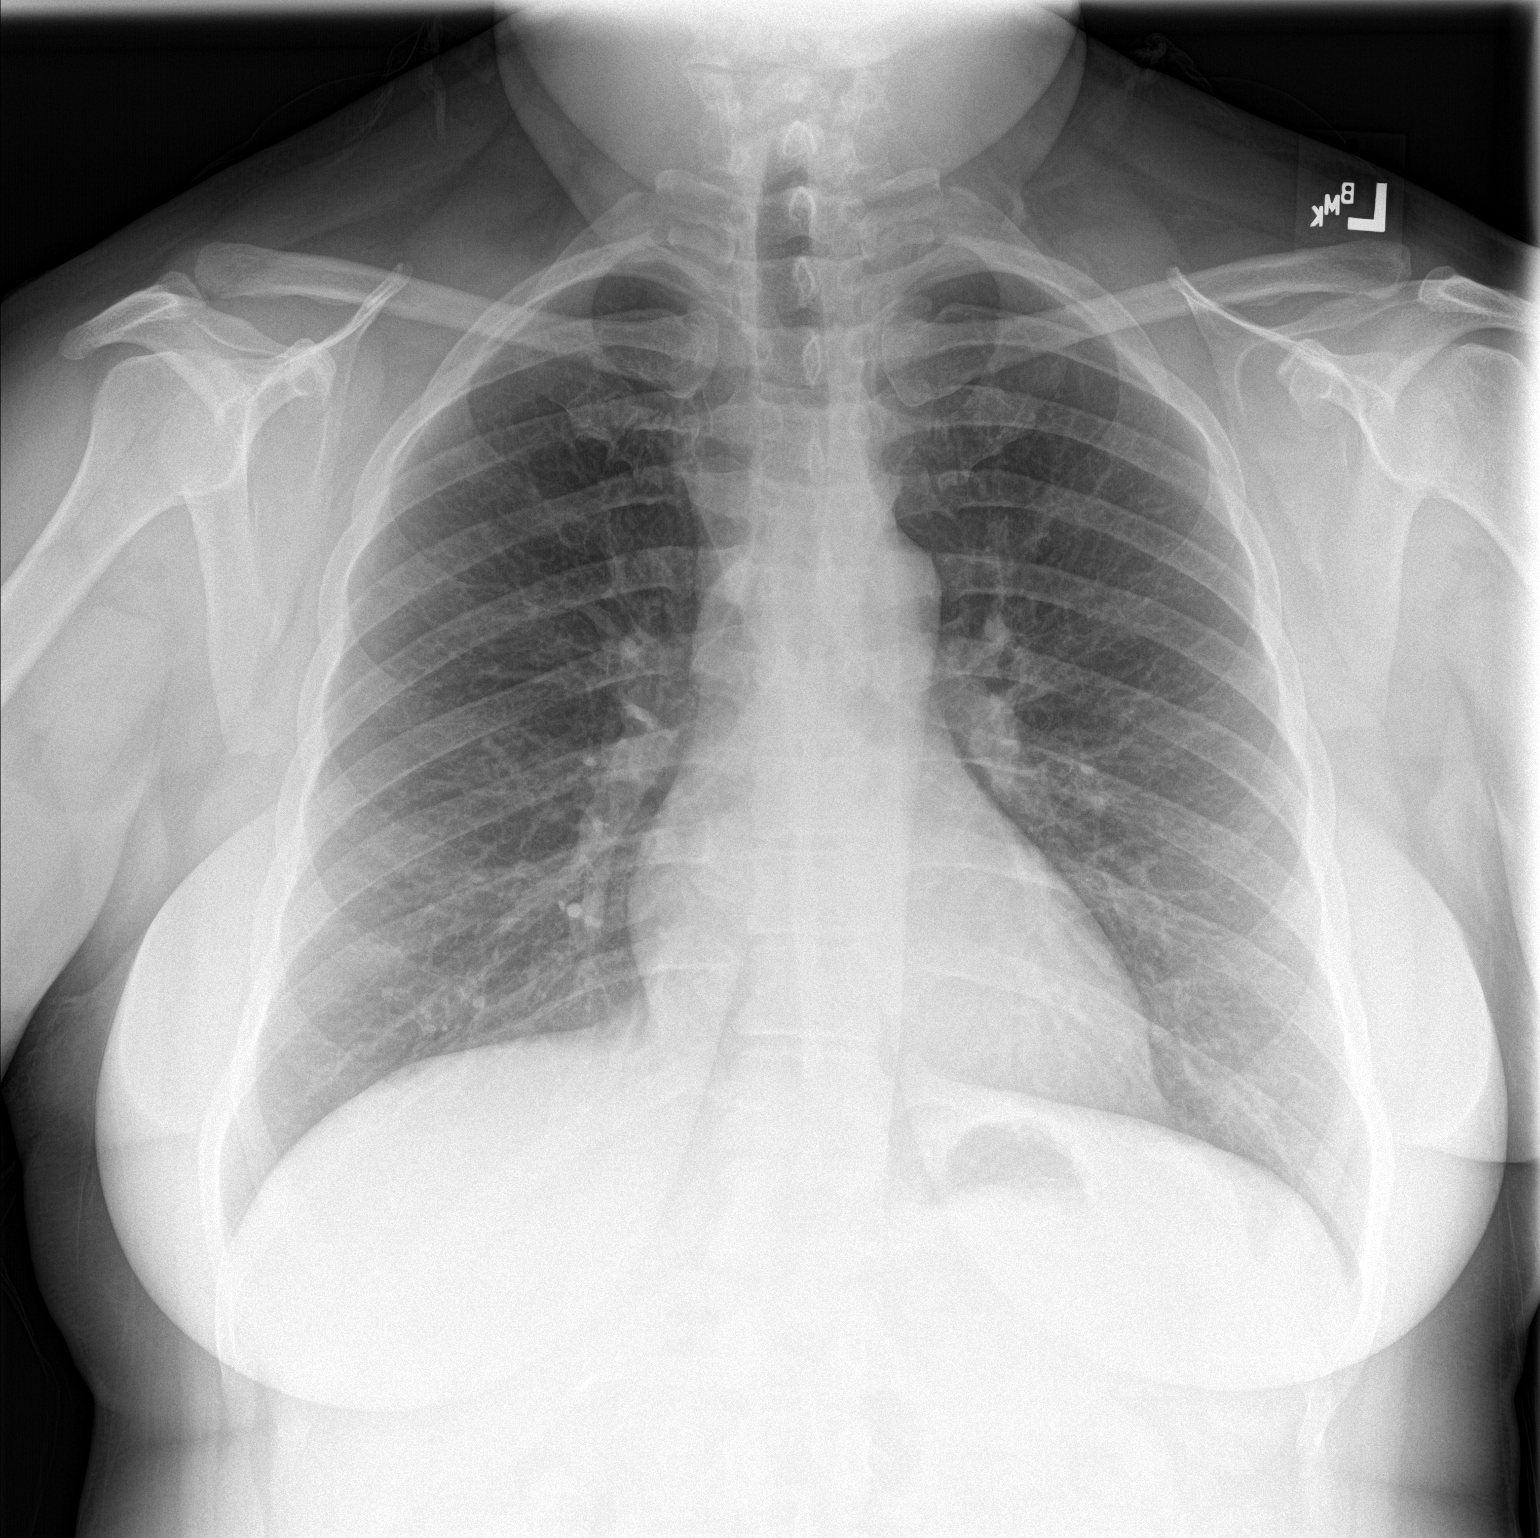
[im 2/2]
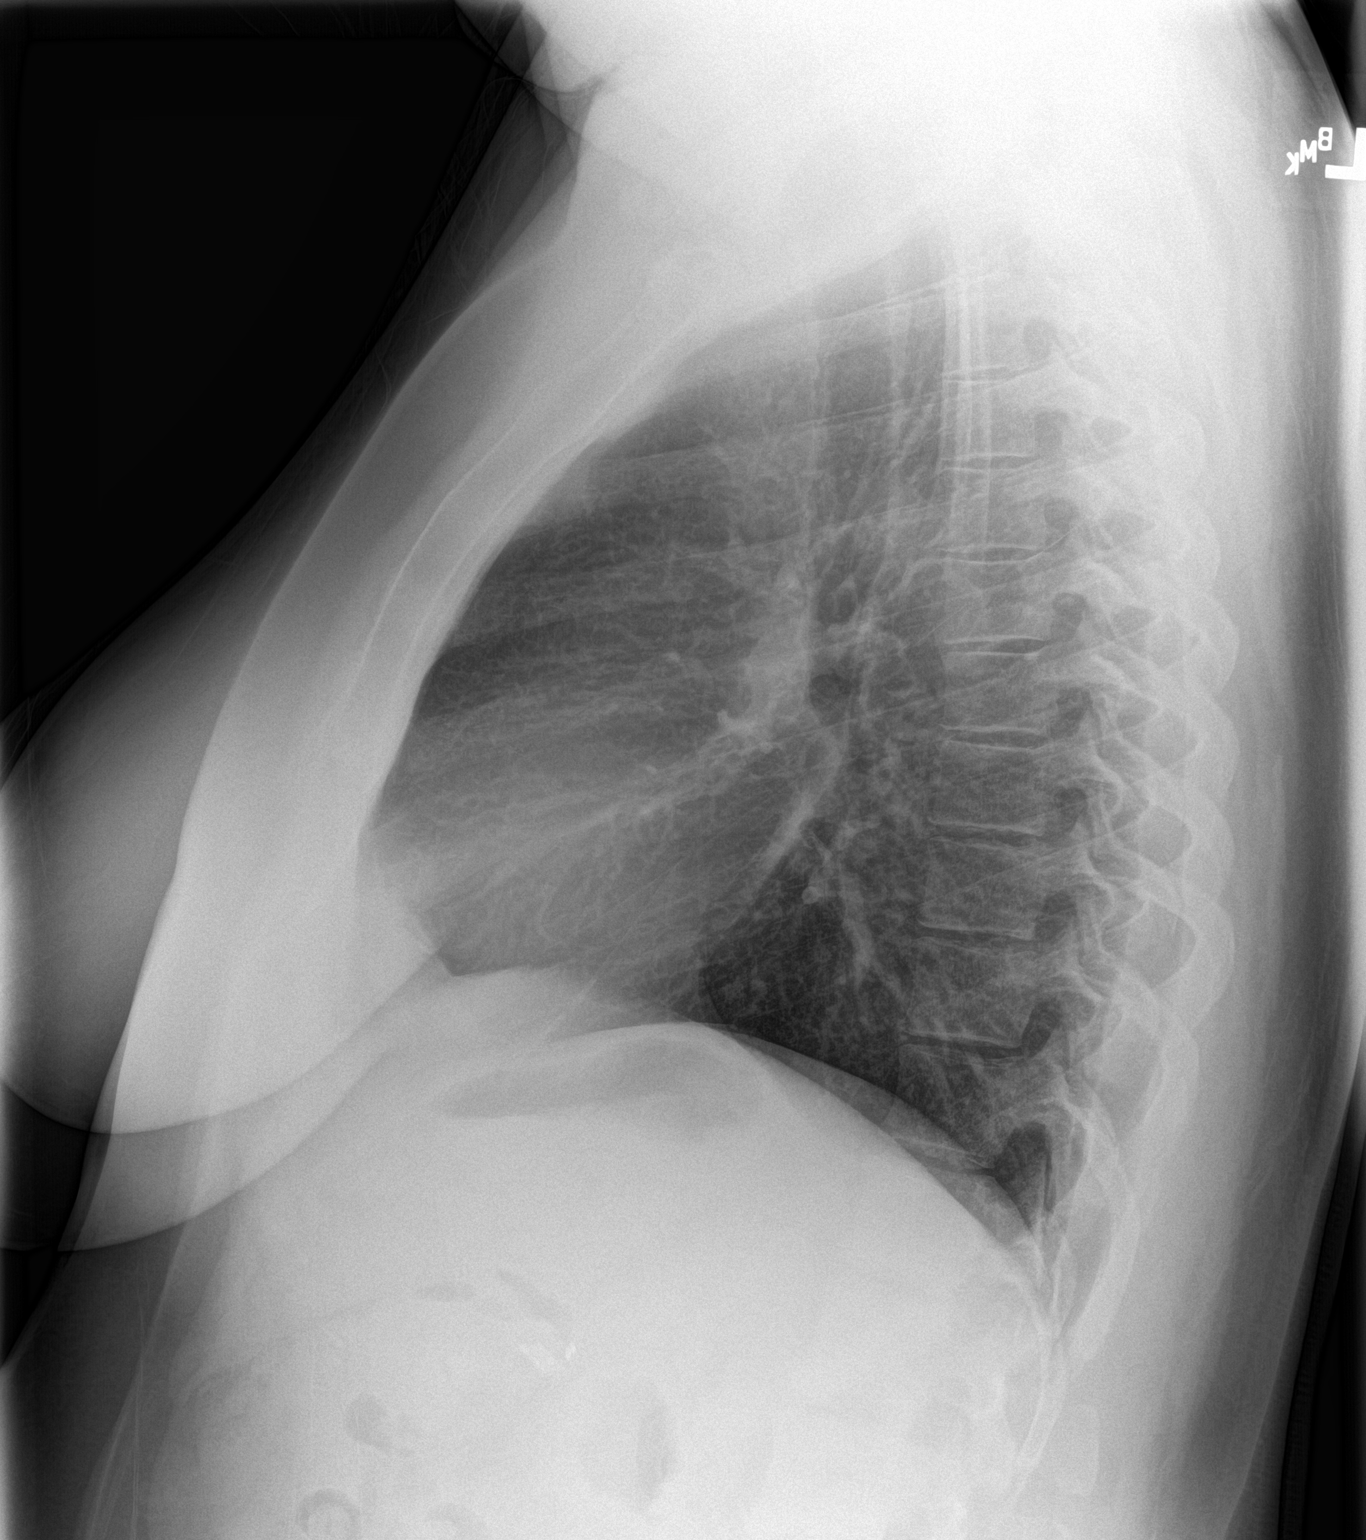

[2 of 2 positions shown; findings below may reference images not displayed]

FINDINGS: The lungs are clear. The pulmonary vasculature is normal. Heart size
is normal. Hilar and mediastinal contours are unremarkable. There is
no pleural effusion.
IMPRESSION: No active cardiopulmonary disease.

## 2016-05-08 MED ORDER — ACETAMINOPHEN 325 MG PO TABS
650.0000 mg | ORAL_TABLET | Freq: Once | ORAL | Status: AC
  Administered 2016-05-08: 650 mg via ORAL
  Filled 2016-05-08: qty 2

## 2016-05-08 MED ORDER — NITROGLYCERIN 0.4 MG SL SUBL
0.4000 mg | SUBLINGUAL_TABLET | Freq: Once | SUBLINGUAL | Status: AC
  Administered 2016-05-08: 0.4 mg via SUBLINGUAL
  Filled 2016-05-08: qty 1

## 2016-05-08 NOTE — ED Notes (Addendum)
Dr. Manson PasseyBrown at bedside.   Pt had negative cath done in July.

## 2016-05-08 NOTE — ED Provider Notes (Signed)
[facility] Emergency Department Provider Note    First MD Initiated Contact with Patient 05/08/16 [phone]     (approximate)  I have reviewed the triage vital signs and the nursing notes.   HISTORY  Chief Complaint Chest Pain   HPI Morgan Stevens is a 50 y.o. female with history of reflux thyroid disease and previous history of chest pain presents to the emergency department with central nonradiating chest pain described as sharp at 11:30 PM tonight. Patient states that the pain has improved however has not subsided completely. Patient states that she's had similar pain in the past which she had a catheterization done in July which was negative   Past Medical History:  Diagnosis Date  . Obesity (BMI 30-39.9)   . Reflux   . Thyroid disease     Patient Active Problem List   Diagnosis Date Noted  . Pain in the chest   . Abnormal cardiac function test 02/26/2016  . Obesity (BMI 30-39.9)   . Angina pectoris (HCC) 02/25/2016  . Dyspnea 02/25/2016  . Hypothyroidism 02/25/2016  . Chest pain 09/07/2015    Past Surgical History:  Procedure Laterality Date  . CARDIAC CATHETERIZATION  02/27/2016   Procedure: Left Heart Cath and Coronary Angiography;  Surgeon: Corky CraftsJayadeep S Varanasi, MD;  [facility];  Service: Cardiovascular;;  . CESAREAN SECTION    . CHOLECYSTECTOMY    . THYROIDECTOMY      Prior to Admission medications   Medication Sig Start Date End Date Taking? Authorizing Provider  levothyroxine (SYNTHROID, LEVOTHROID) 137 MCG tablet Take 137 mcg by mouth daily before breakfast.   Yes Historical Provider, MD  naproxen (NAPROSYN) 500 MG tablet Take 1 tablet (500 mg total) by mouth 2 (two) times daily with a meal. 04/11/16  Yes Jami L Hagler, PA-C    Allergies No known drug allergies History reviewed. No pertinent family history.  Social History Social History  Substance Use Topics  . Smoking status: Never Smoker  . Smokeless tobacco:  Never Used  . Alcohol use No    Review of Systems Constitutional: No fever/chills Eyes: No visual changes. ENT: No sore throat. Cardiovascular:Positive for chest pain. Respiratory: Denies shortness of breath. Gastrointestinal: No abdominal pain.  No nausea, no vomiting.  No diarrhea.  No constipation. Genitourinary: Negative for dysuria. Musculoskeletal: Negative for back pain. Skin: Negative for rash. Neurological: Negative for headaches, focal weakness or numbness.  10-point ROS otherwise negative.  ____________________________________________   PHYSICAL EXAM:  VITAL SIGNS: ED Triage Vitals  Enc Vitals Group     BP 05/08/16 0154 (!) 122/56     Pulse Rate 05/08/16 0154 64     Resp 05/08/16 0154 17     Temp 05/08/16 0154 98.3 F (36.8 C)     Temp Source 05/08/16 0154 Oral     SpO2 05/08/16 0154 99 %     Weight 05/08/16 0044 217 lb (98.4 kg)     Height 05/08/16 0044 5\' 4"  (1.626 m)     Head Circumference --      Peak Flow --      Pain Score 05/08/16 0045 8     Pain Loc --      Pain Edu? --      Excl. in GC? --     Constitutional: Alert and oriented. Well appearing and in no acute distress. Eyes: Conjunctivae are normal. PERRL. EOMI. Head: Atraumatic.Marland Kitchen. Mouth/Throat: Mucous membranes are moist.  Oropharynx non-erythematous. Neck: No stridor.  No  meningeal signs.  Cardiovascular: Normal rate, regular rhythm. Good peripheral circulation. Grossly normal heart sounds. Respiratory: Normal respiratory effort.  No retractions. Lungs CTAB. Gastrointestinal: Soft and nontender. No distention.  Musculoskeletal: No lower extremity tenderness nor edema. No gross deformities of extremities. Neurologic:  Normal speech and language. No gross focal neurologic deficits are appreciated.  Skin:  Skin is warm, dry and intact. No rash noted. Psychiatric: Mood and affect are normal. Speech and behavior are normal.  ____________________________________________   LABS (all labs  ordered are listed, but only abnormal results are displayed)  Labs Reviewed  BASIC METABOLIC PANEL - Abnormal; Notable for the following:       Result Value   Glucose, Bld 105 (*)    All other components within normal limits  CBC - Abnormal; Notable for the following:    HCT 34.7 (*)    All other components within normal limits  TROPONIN I  TROPONIN I   ____________________________________________  EKG  ED ECG REPORT I, [place] N BROWN, the attending physician, personally viewed and interpreted this ECG.   Date: 05/08/2016  EKG Time: 12:44 AM  Rate: 63  Rhythm: Normal sinus rhythm  Axis: Normal  Intervals: Normal  ST&T Change: None  ____________________________________________  RADIOLOGY I, [place] N BROWN, personally viewed and evaluated these images (plain radiographs) as part of my medical decision making, as well as reviewing the written report by the radiologist.  Dg Chest 2 View  Result Date: 05/08/2016 CLINICAL DATA:  Sudden onset chest pain around 23:30. EXAM: CHEST  2 VIEW COMPARISON:  04/13/2016 FINDINGS: The lungs are clear. The pulmonary vasculature is normal. Heart size is normal. Hilar and mediastinal contours are unremarkable. There is no pleural effusion. IMPRESSION: No active cardiopulmonary disease. Electronically Signed   By: Ellery Plunk M.D.   On: 05/08/2016 01:14      Procedures      INITIAL IMPRESSION / ASSESSMENT AND PLAN / ED COURSE  Pertinent labs & imaging results that were available during my care of the patient were reviewed by me and considered in my medical decision making (see chart for details).  Consider potential cardiac etiology of the patient's chest pain however EKG revealed no evidence of ischemia or infarction, troponin negative 2. Patient's pain completely resolved after sublingual nitroglycerin consider possibility of esophageal spasm for patient's etiology of her pain given negative catheterization performed July  2017.   Clinical Course    ____________________________________________  FINAL CLINICAL IMPRESSION(S) / ED DIAGNOSES  Final diagnoses:  Chest pain, unspecified chest pain type     MEDICATIONS GIVEN DURING THIS VISIT:  Medications  nitroGLYCERIN (NITROSTAT) SL tablet 0.4 mg (0.4 mg Sublingual Given 05/08/16 0303)  acetaminophen (TYLENOL) tablet 650 mg (650 mg Oral Given 05/08/16 0313)     NEW OUTPATIENT MEDICATIONS STARTED DURING THIS VISIT:  New Prescriptions   No medications on file    Modified Medications   No medications on file    Discontinued Medications   No medications on file     Note:  This document was prepared using Dragon voice recognition software and may include unintentional dictation errors.    Darci Current, MD 05/08/16 [phone]

## 2016-05-08 NOTE — ED Triage Notes (Signed)
Pt presents to ED with sudden onset of chest pain last night around 2330 and pt reports pain has not subsided. Pt describes her pain as sharp and tight in the center of her chest making her feel short of breath. Hx of the same with stress test performed with negative results. Pt appears slightly anxious with no increased work of breathing or acute distress noted at this time. Skin warm and dry.

## 2016-05-08 NOTE — ED Notes (Signed)
Pt states central CP since 11:30p last night. Pt states SOB and dizziness. Pt has not taken any pain medication. Denies N&V, diarrhea, fever. Denies cardiac hx. Pt talking in complete sentences, in no apparent distress. Talking very softly.

## 2016-06-13 ENCOUNTER — Encounter: Payer: Self-pay | Admitting: Medical Oncology

## 2016-06-13 ENCOUNTER — Emergency Department
Admission: EM | Admit: 2016-06-13 | Discharge: 2016-06-13 | Disposition: A | Discharge status: Home / Self Care | Payer: Self-pay | Attending: Emergency Medicine | Admitting: Emergency Medicine

## 2016-06-13 DIAGNOSIS — R110 Nausea: Secondary | ICD-10-CM | POA: Insufficient documentation

## 2016-06-13 DIAGNOSIS — R42 Dizziness and giddiness: Secondary | ICD-10-CM | POA: Insufficient documentation

## 2016-06-13 DIAGNOSIS — Z79899 Other long term (current) drug therapy: Secondary | ICD-10-CM | POA: Insufficient documentation

## 2016-06-13 DIAGNOSIS — Z5321 Procedure and treatment not carried out due to patient leaving prior to being seen by health care provider: Secondary | ICD-10-CM | POA: Insufficient documentation

## 2016-06-13 LAB — CBC
HCT: 40 % (ref 35.0–47.0)
Hemoglobin: 13.5 g/dL (ref 12.0–16.0)
MCH: 29.8 pg (ref 26.0–34.0)
MCHC: 33.8 g/dL (ref 32.0–36.0)
MCV: 88.1 fL (ref 80.0–100.0)
Platelets: 265 10*3/uL (ref 150–440)
RBC: 4.54 MIL/uL (ref 3.80–5.20)
RDW: 13.7 % (ref 11.5–14.5)
WBC: 7.1 10*3/uL (ref 3.6–11.0)

## 2016-06-13 LAB — BASIC METABOLIC PANEL
Anion gap: 8 (ref 5–15)
BUN: 7 mg/dL (ref 6–20)
CO2: 25 mmol/L (ref 22–32)
Calcium: 9.1 mg/dL (ref 8.9–10.3)
Chloride: 105 mmol/L (ref 101–111)
Creatinine, Ser: 0.78 mg/dL (ref 0.44–1.00)
GFR calc Af Amer: >60 mL/min (ref >60)
GFR calc non Af Amer: >60 mL/min (ref >60)
Glucose, Bld: 102 mg/dL — ABNORMAL HIGH (ref 65–99)
Potassium: 3.8 mmol/L (ref 3.5–5.1)
Sodium: 138 mmol/L (ref 135–145)

## 2016-06-13 NOTE — ED Triage Notes (Signed)
Pt reports approx 1 hr pta she had an episode of dizziness and nausea that hit her suddenly. Pt also reports that she feels like there is a knot in her throat.

## 2016-06-13 NOTE — ED Notes (Signed)
Unable to give specimen at the moment.

## 2016-07-02 ENCOUNTER — Emergency Department: Payer: Self-pay

## 2016-07-02 ENCOUNTER — Emergency Department
Admission: EM | Admit: 2016-07-02 | Discharge: 2016-07-02 | Disposition: A | Discharge status: Home / Self Care | Payer: Self-pay | Attending: Emergency Medicine | Admitting: Emergency Medicine

## 2016-07-02 DIAGNOSIS — Z79899 Other long term (current) drug therapy: Secondary | ICD-10-CM | POA: Insufficient documentation

## 2016-07-02 DIAGNOSIS — Z5321 Procedure and treatment not carried out due to patient leaving prior to being seen by health care provider: Secondary | ICD-10-CM | POA: Insufficient documentation

## 2016-07-02 DIAGNOSIS — R072 Precordial pain: Secondary | ICD-10-CM | POA: Insufficient documentation

## 2016-07-02 LAB — BASIC METABOLIC PANEL
Anion gap: 9 (ref 5–15)
BUN: 9 mg/dL (ref 6–20)
CO2: 25 mmol/L (ref 22–32)
Calcium: 9.2 mg/dL (ref 8.9–10.3)
Chloride: 105 mmol/L (ref 101–111)
Creatinine, Ser: 0.7 mg/dL (ref 0.44–1.00)
GFR calc Af Amer: >60 mL/min (ref >60)
GFR calc non Af Amer: >60 mL/min (ref >60)
Glucose, Bld: 95 mg/dL (ref 65–99)
Potassium: 3.6 mmol/L (ref 3.5–5.1)
Sodium: 139 mmol/L (ref 135–145)

## 2016-07-02 LAB — CBC
HCT: 36.4 % (ref 35.0–47.0)
Hemoglobin: 12.7 g/dL (ref 12.0–16.0)
MCH: 30.5 pg (ref 26.0–34.0)
MCHC: 34.8 g/dL (ref 32.0–36.0)
MCV: 87.6 fL (ref 80.0–100.0)
Platelets: 342 10*3/uL (ref 150–440)
RBC: 4.16 MIL/uL (ref 3.80–5.20)
RDW: 13.7 % (ref 11.5–14.5)
WBC: 7.2 10*3/uL (ref 3.6–11.0)

## 2016-07-02 LAB — TROPONIN I: Troponin I: <0.03 ng/mL (ref <0.03)

## 2016-07-02 IMAGING — CR DG CHEST 2V
2 series · 2 of 2 positions shown · non-contrast
Comparison: [DATE] chest radiograph

CLINICAL DATA: 50 y/o  F; chest pain.

EXAM:
CHEST  2 VIEW

[chest pa]
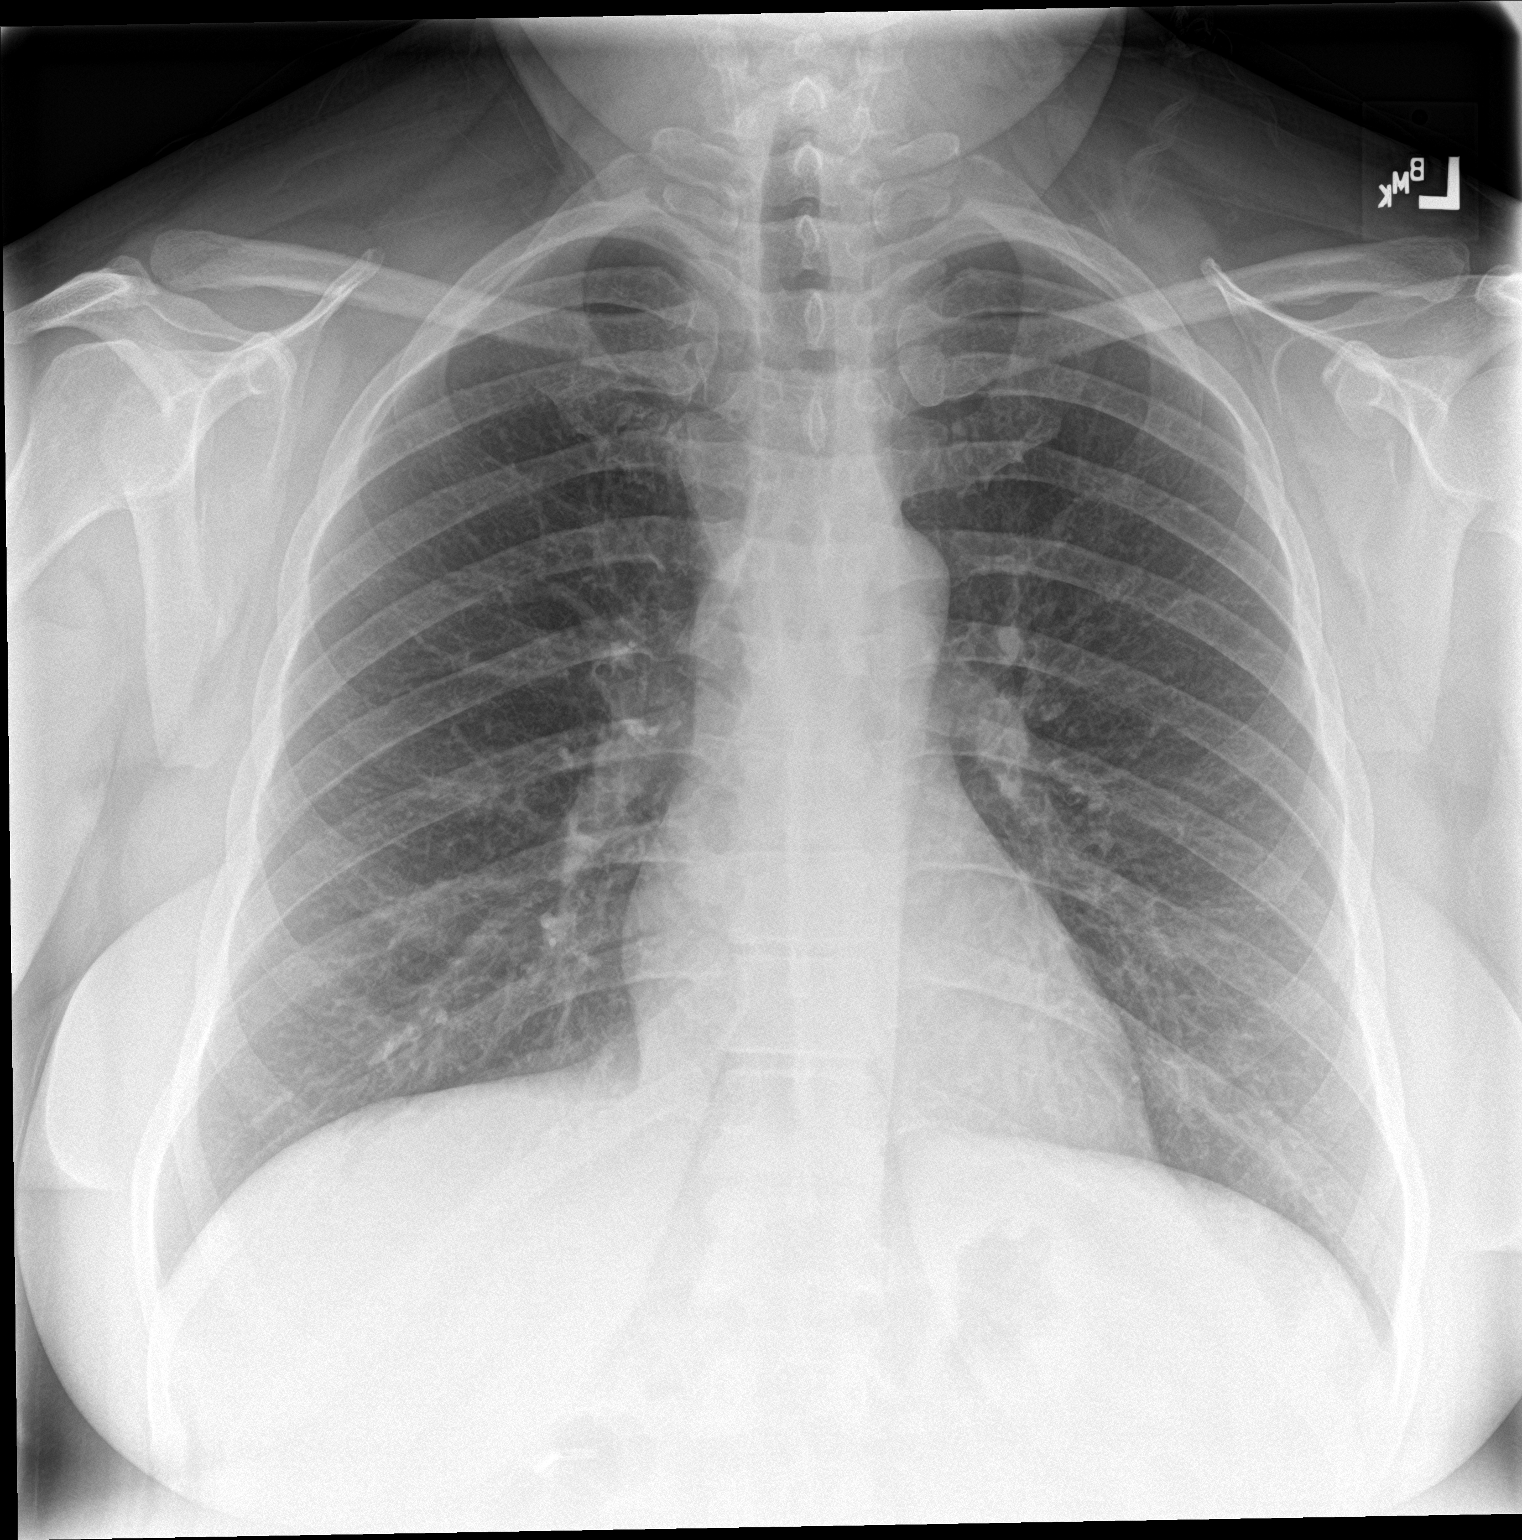

[chest lat]
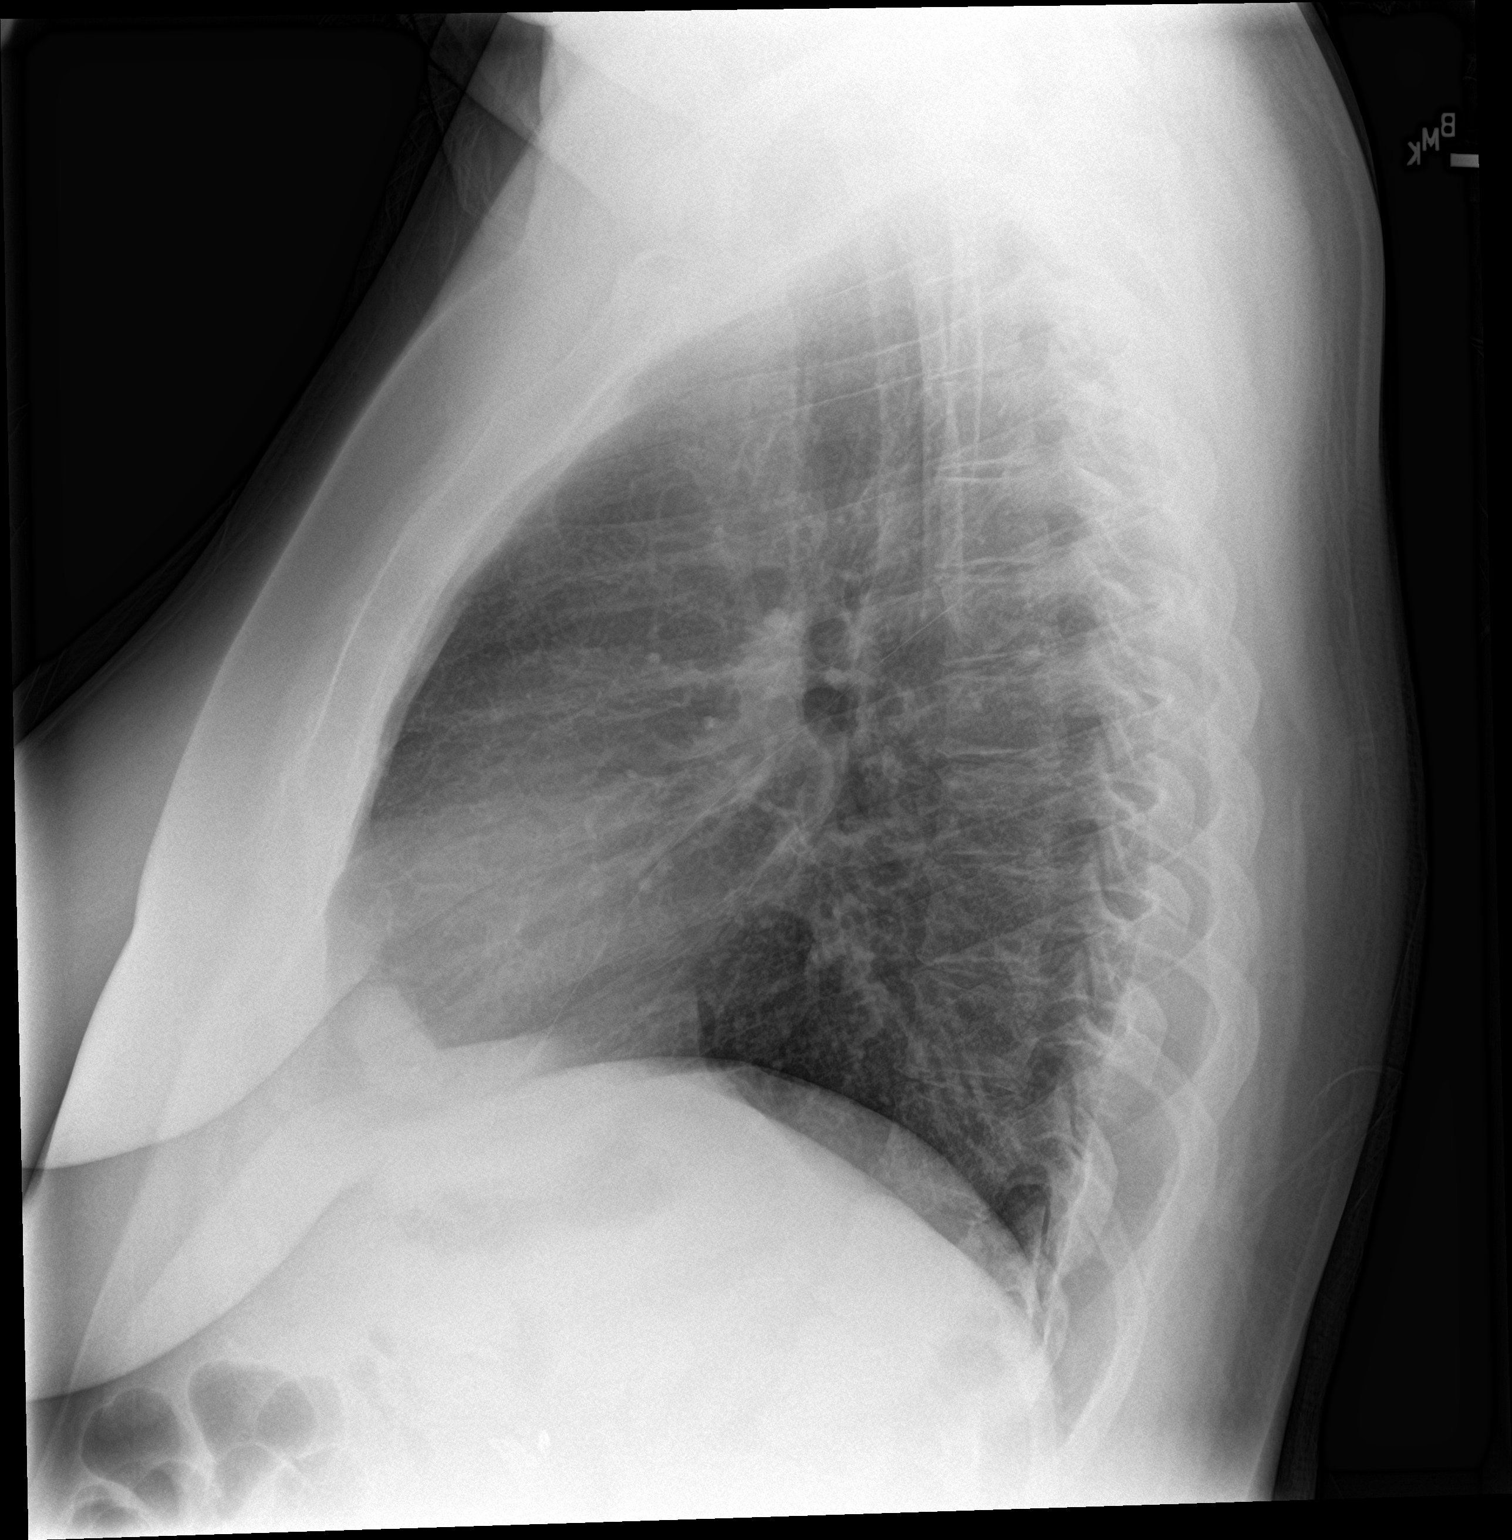

[2 of 2 positions shown; findings below may reference images not displayed]

FINDINGS: The heart size and mediastinal contours are within normal limits and
stable. Both lungs are clear. The visualized skeletal structures are
unremarkable. Status post cholecystectomy.
IMPRESSION: No active cardiopulmonary disease.

By: BALEDI M.D.

## 2016-07-02 NOTE — ED Triage Notes (Signed)
Pt presents to ED with c/o substernal chest pain that started 15 minutes PTA. Pt reports pain is centralized without radiation. Pt reports pain as stabbing/pressure with lightheadness, dizziness and diaphoresis with chills. Pt denies any previous cardiac history.

## 2016-07-08 ENCOUNTER — Emergency Department
Admission: EM | Admit: 2016-07-08 | Discharge: 2016-07-08 | Disposition: A | Discharge status: Home / Self Care | Payer: Self-pay | Attending: Emergency Medicine | Admitting: Emergency Medicine

## 2016-07-08 ENCOUNTER — Emergency Department: Payer: Self-pay

## 2016-07-08 DIAGNOSIS — Z791 Long term (current) use of non-steroidal anti-inflammatories (NSAID): Secondary | ICD-10-CM | POA: Insufficient documentation

## 2016-07-08 DIAGNOSIS — E039 Hypothyroidism, unspecified: Secondary | ICD-10-CM | POA: Insufficient documentation

## 2016-07-08 DIAGNOSIS — M25512 Pain in left shoulder: Secondary | ICD-10-CM | POA: Insufficient documentation

## 2016-07-08 IMAGING — CR DG SHOULDER 2+V*L*
3 series · 3 of 3 positions shown · non-contrast
Comparison: none

CLINICAL DATA: Patient started having shoulder pain today around
[E5]. No known injury. Pain is located anterior, superior and
posterior and between her shoulder blades. No previous injury.
Shielded

EXAM:
LEFT SHOULDER - 2+ VIEW

[shoulder grashey]
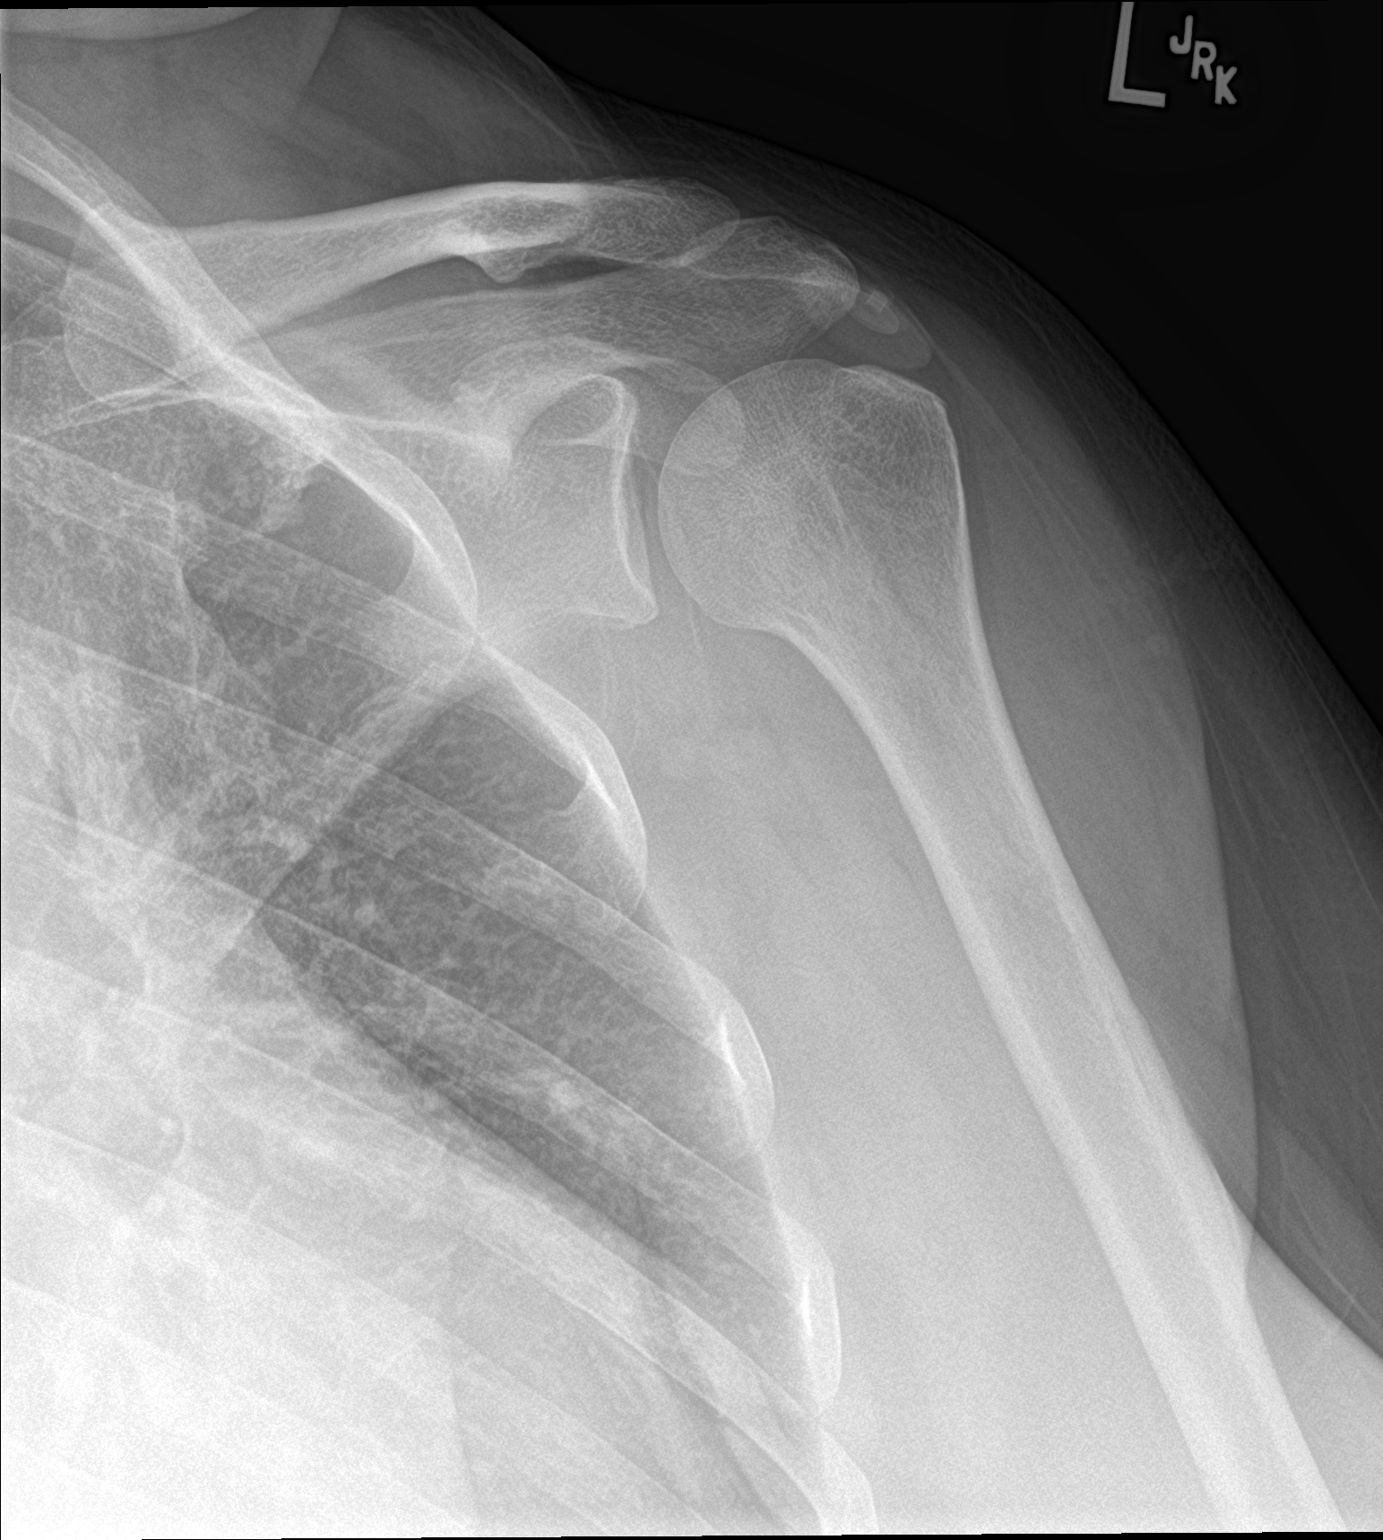

[shoulder y view]
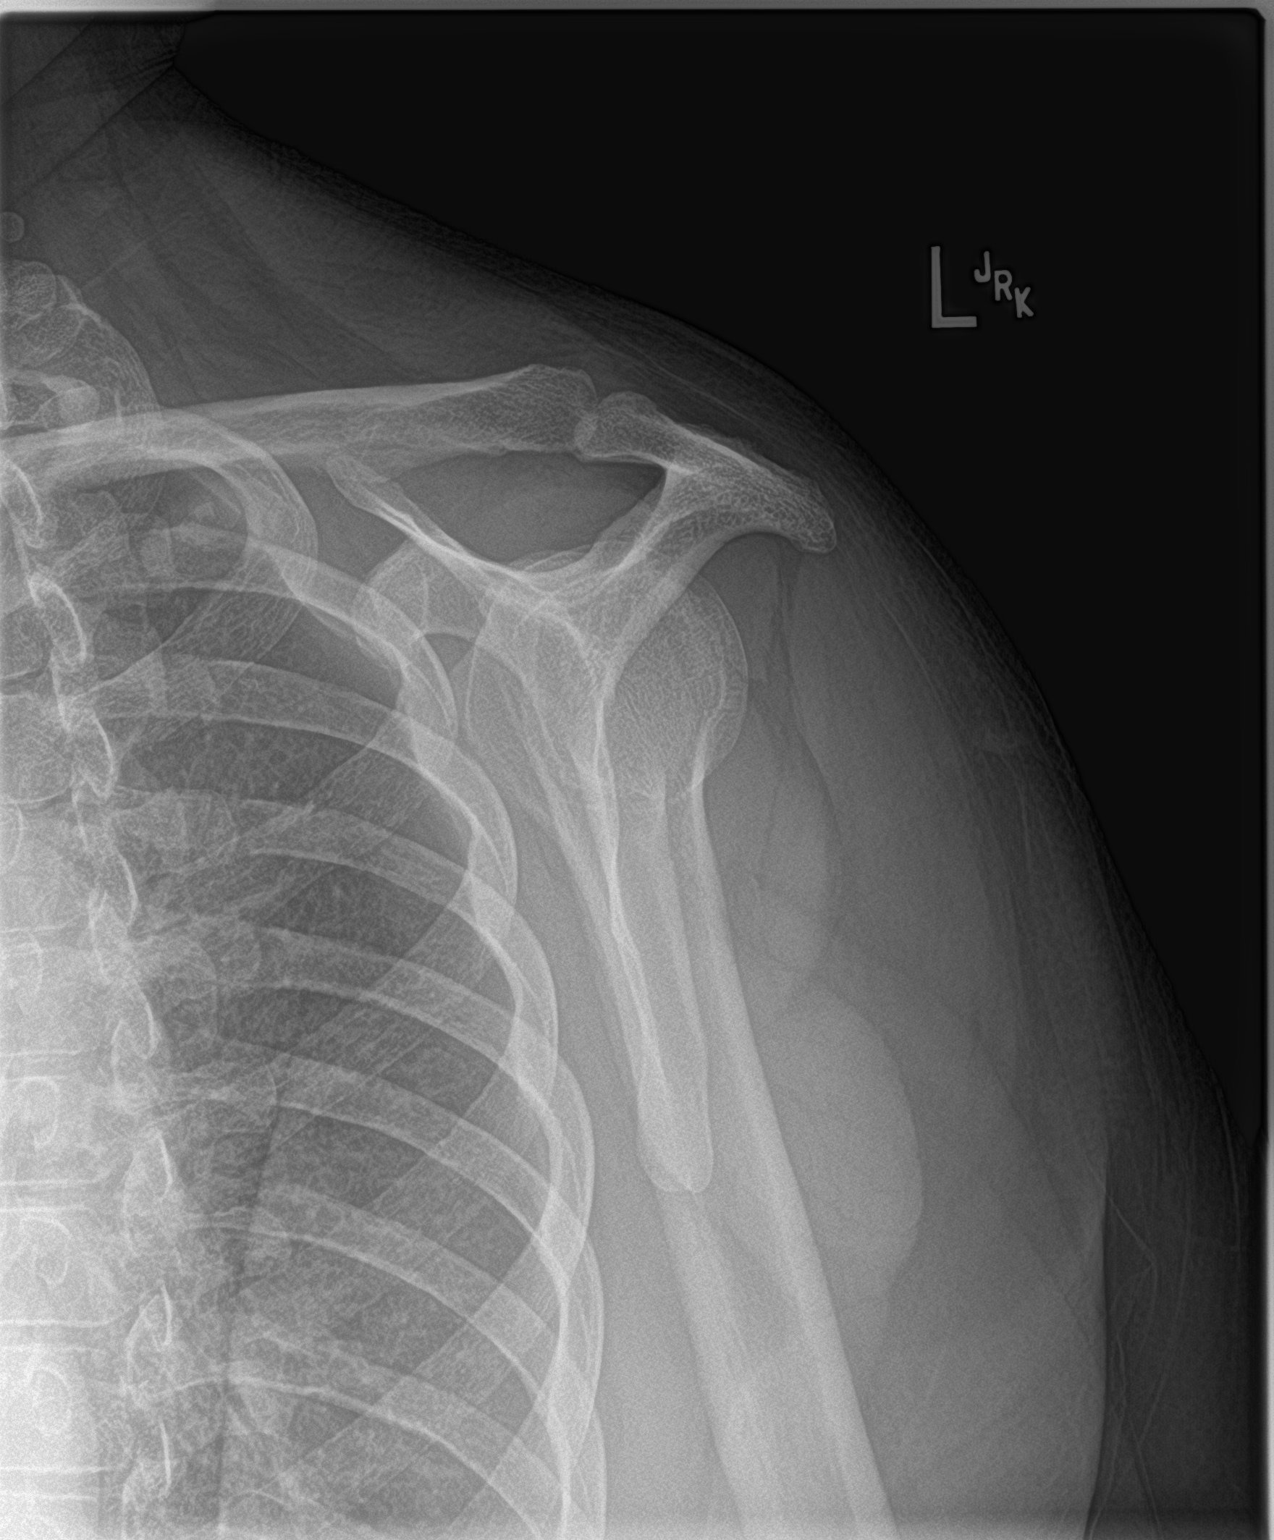

[shoulder axillary]
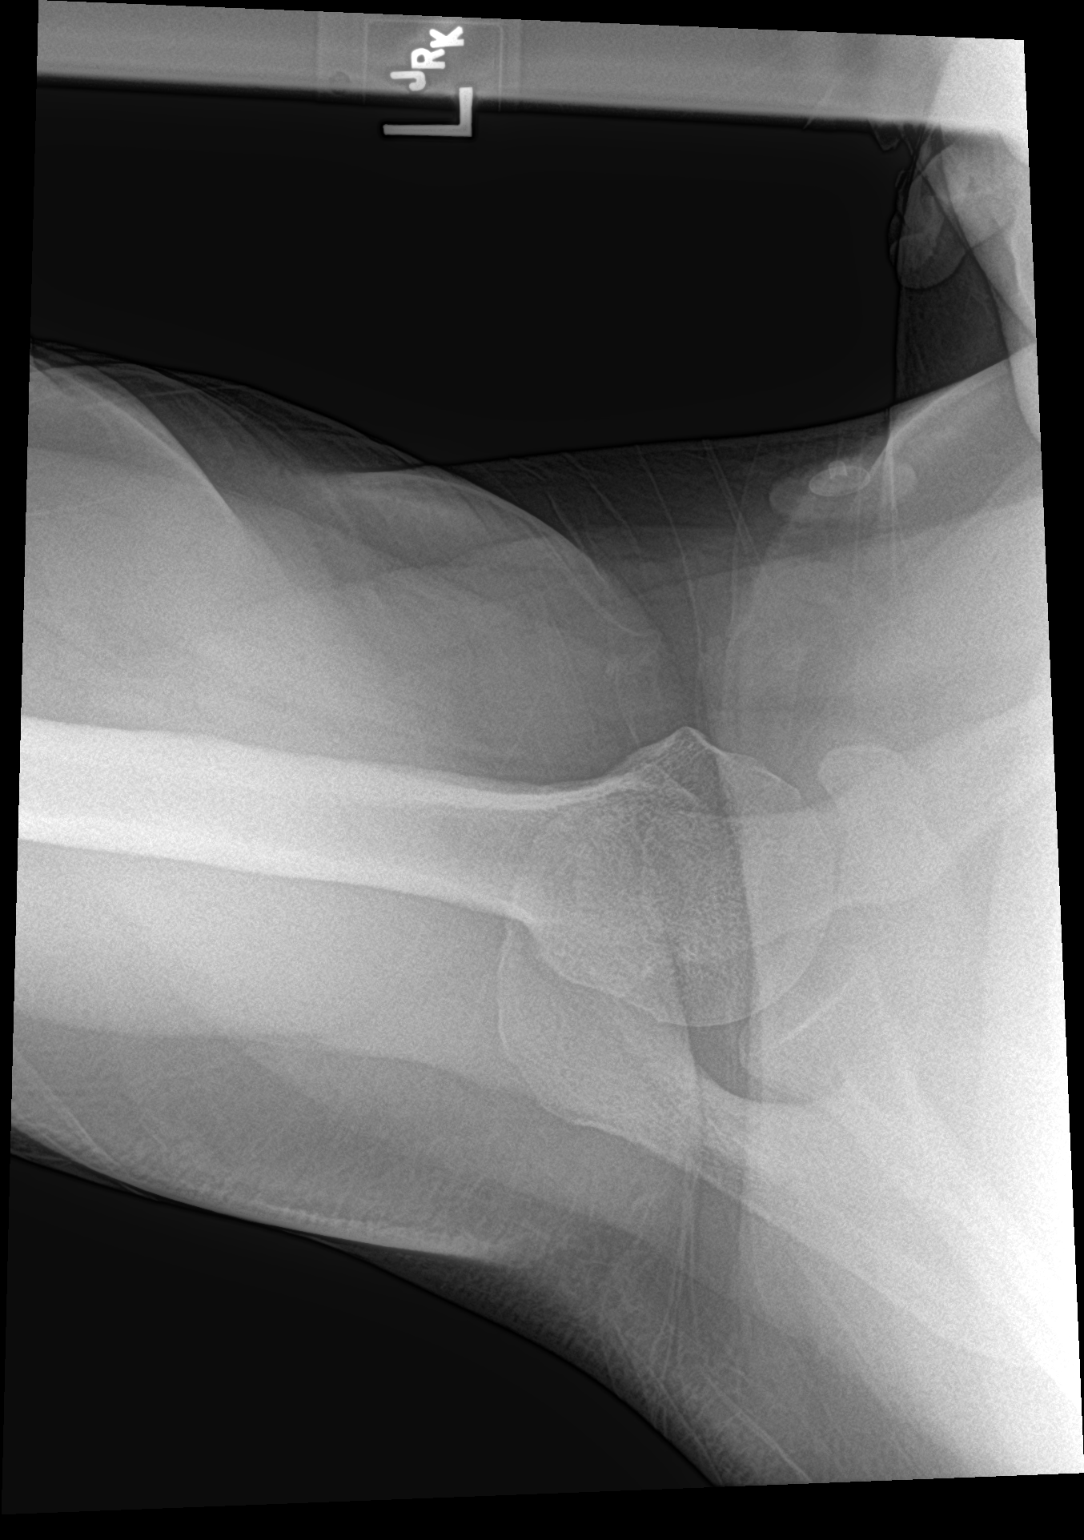

[3 of 3 positions shown; findings below may reference images not displayed]

FINDINGS: Glenohumeral joint is intact. No evidence of scapular fracture or
humeral fracture. The acromioclavicular joint is intact.
IMPRESSION: No fracture or dislocation.

## 2016-07-08 MED ORDER — IBUPROFEN 800 MG PO TABS: 800.0000 mg | ORAL_TABLET | Freq: Three times a day (TID) | ORAL | 0 refills | Status: DC | PRN

## 2016-07-08 MED ORDER — OXYCODONE-ACETAMINOPHEN 5-325 MG PO TABS: 1.0000 | ORAL_TABLET | ORAL | 0 refills | Status: DC | PRN

## 2016-07-08 MED ORDER — KETOROLAC TROMETHAMINE 60 MG/2ML IM SOLN
60.0000 mg | Freq: Once | INTRAMUSCULAR | Status: AC
  Administered 2016-07-08: 60 mg via INTRAMUSCULAR
  Filled 2016-07-08: qty 2

## 2016-07-08 MED ORDER — OXYCODONE-ACETAMINOPHEN 5-325 MG PO TABS
1.0000 | ORAL_TABLET | Freq: Once | ORAL | Status: AC
  Administered 2016-07-08: 1 via ORAL
  Filled 2016-07-08: qty 1

## 2016-07-08 NOTE — ED Triage Notes (Signed)
Pt states that she has been having left shoulder pain since this afternoon, pain increases with movement, pt denies injury, denies pain with breathing, pt denies sob, denies any chest pain, pt reports pain between her shoulder blades

## 2016-07-08 NOTE — ED Provider Notes (Signed)
[facility] Emergency Department Provider Note  ____________________________________________  Time seen: Approximately 8:10 PM  I have reviewed the triage vital signs and the nursing notes.   HISTORY  Chief Complaint Shoulder Pain   HPI Morgan Stevens is a 50 y.o. female history of obesity, GERD, hypothyroidism who presents for evaluation of left shoulder pain. Patient reports sudden onset of pain while she was sitting down earlier this afternoon. Her pain is 8 out of 10, sharp and throbbing in nature, located in the left shoulder, worse with movement of the shoulder, nonradiating. She has tried an aspirin at home with no improvement of the pain. She denies any trauma to the shoulder. Fever, chills, chest pain, shortness of breath, abdominal pain, nausea, vomiting, headache, neck pain, back pain. She denies any numbness or weakness of her left arm.  Past Medical History:  Diagnosis Date  . Obesity (BMI 30-39.9)   . Reflux   . Thyroid disease     Patient Active Problem List   Diagnosis Date Noted  . Pain in the chest   . Abnormal cardiac function test 02/26/2016  . Obesity (BMI 30-39.9)   . Angina pectoris (HCC) 02/25/2016  . Dyspnea 02/25/2016  . Hypothyroidism 02/25/2016  . Chest pain 09/07/2015    Past Surgical History:  Procedure Laterality Date  . CARDIAC CATHETERIZATION  02/27/2016   Procedure: Left Heart Cath and Coronary Angiography;  Surgeon: Corky CraftsJayadeep S Varanasi, MD;  [facility];  Service: Cardiovascular;;  . CESAREAN SECTION    . CHOLECYSTECTOMY    . THYROIDECTOMY      Prior to Admission medications   Medication Sig Start Date End Date Taking? Authorizing Provider  ibuprofen (ADVIL,MOTRIN) 800 MG tablet Take 1 tablet (800 mg total) by mouth every 8 (eight) hours as needed for mild pain or moderate pain. 07/08/16   Nita Sicklearolina Makai Dumond, MD  levothyroxine (SYNTHROID, LEVOTHROID) 137 MCG tablet Take 137 mcg by mouth daily  before breakfast.    Historical Provider, MD  naproxen (NAPROSYN) 500 MG tablet Take 1 tablet (500 mg total) by mouth 2 (two) times daily with a meal. 04/11/16   Jami L Hagler, PA-C  oxyCODONE-acetaminophen (ROXICET) 5-325 MG tablet Take 1 tablet by mouth every 4 (four) hours as needed for severe pain. 07/08/16   Nita Sicklearolina Avis Mcmahill, MD    Allergies Patient has no known allergies.  No family history on file.  Social History Social History  Substance Use Topics  . Smoking status: Never Smoker  . Smokeless tobacco: Never Used  . Alcohol use No    Review of Systems  Constitutional: Negative for fever. Eyes: Negative for visual changes. ENT: Negative for sore throat. Cardiovascular: Negative for chest pain. Respiratory: Negative for shortness of breath. Gastrointestinal: Negative for abdominal pain, vomiting or diarrhea. Genitourinary: Negative for dysuria.  Musculoskeletal: Negative for back pain. + left shoulder pain Skin: Negative for rash. Neurological: Negative for headaches, weakness or numbness.  ____________________________________________   PHYSICAL EXAM:  VITAL SIGNS: ED Triage Vitals  Enc Vitals Group     BP 07/08/16 1945 (!) 156/90     Pulse Rate 07/08/16 1945 73     Resp 07/08/16 1945 18     Temp 07/08/16 1945 97.7 F (36.5 C)     Temp Source 07/08/16 1945 Oral     SpO2 07/08/16 1945 99 %     Weight 07/08/16 1946 209 lb (94.8 kg)     Height 07/08/16 1946 5\' 4"  (1.626 m)  Head Circumference --      Peak Flow --      Pain Score 07/08/16 1946 9     Pain Loc --      Pain Edu? --      Excl. in GC? --     Constitutional: Alert and oriented. Well appearing and in no apparent distress. HEENT:      Head: Normocephalic and atraumatic.         Eyes: Conjunctivae are normal. Sclera is non-icteric. EOMI. PERRL      Mouth/Throat: Mucous membranes are moist.       Neck: Supple with no signs of meningismus. Cardiovascular: Regular rate and rhythm. No murmurs,  gallops, or rubs. 2+ symmetrical distal pulses are present in all extremities. No JVD. Respiratory: Normal respiratory effort. Lungs are clear to auscultation bilaterally. No wheezes, crackles, or rhonchi.  Gastrointestinal: Soft, non tender, and non distended with positive bowel sounds. No rebound or guarding. Musculoskeletal: No deformity, swelling or redness of the left shoulder, pain with palpation of the anterior humeral head, patient has full ROM of the L shoulder however has significant pain when arm is extended above the level of the clavicle. Neurologic: Normal speech and language. Face is symmetric. Moving all extremities. No gross focal neurologic deficits are appreciated. Skin: Skin is warm, dry and intact. No rash noted. Psychiatric: Mood and affect are normal. Speech and behavior are normal.  ____________________________________________   LABS (all labs ordered are listed, but only abnormal results are displayed)  Labs Reviewed - No data to display ____________________________________________  EKG  ED ECG REPORT I, Nita Sicklearolina Jalin Alicea, the attending physician, personally viewed and interpreted this ECG.  Normal sinus rhythm, rate of 81, normal intervals, normal axis, no ST elevations. Normal EKG. ____________________________________________  RADIOLOGY  XR L shoulder: No fracture or dislocation. ____________________________________________   PROCEDURES  Procedure(s) performed: None Procedures Critical Care performed:  None ____________________________________________   INITIAL IMPRESSION / ASSESSMENT AND PLAN / ED COURSE  50 y.o. female history of obesity, GERD, hypothyroidism who presents for evaluation of left shoulder pain. Pain reproducible on palpation and with full extension of the shoulder concerning for MSK. No fever, intact range of motion, no swelling or redness to suspect inflammatory or septic joint. No deformity or signs of dislocation on exam. Plan for  XR, IM toradol, PO percocet.  Clinical Course   X-ray with no acute findings. Pain is markedly improved after Toradol and Percocet. We'll discharge home on 800 mg of ibuprofen, Percocet, and follow up with Dr. Joice LoftsPoggi. I discussed return precautions with patient and recommended she return to the emergency room if she noticed redness of her joint, fever, or inability to move her shoulder as these could be signs of a more concerning etiology like a septic joint. Patient agrees to return if these develop.  Pertinent labs & imaging results that were available during my care of the patient were reviewed by me and considered in my medical decision making (see chart for details).    ____________________________________________   FINAL CLINICAL IMPRESSION(S) / ED DIAGNOSES  Final diagnoses:  Acute pain of left shoulder      NEW MEDICATIONS STARTED DURING THIS VISIT:  New Prescriptions   IBUPROFEN (ADVIL,MOTRIN) 800 MG TABLET    Take 1 tablet (800 mg total) by mouth every 8 (eight) hours as needed for mild pain or moderate pain.   OXYCODONE-ACETAMINOPHEN (ROXICET) 5-325 MG TABLET    Take 1 tablet by mouth every 4 (four) hours as needed for  severe pain.     Note:  This document was prepared using Dragon voice recognition software and may include unintentional dictation errors.    Nita Sickle, MD 07/08/16 2158

## 2016-07-08 NOTE — Discharge Instructions (Signed)
Return to the ER if you have fever, redness of your shoulder, or inability to move your shoulder.

## 2016-07-25 ENCOUNTER — Encounter: Payer: Self-pay | Admitting: Emergency Medicine

## 2016-07-25 ENCOUNTER — Emergency Department: Payer: Self-pay

## 2016-07-25 ENCOUNTER — Emergency Department
Admission: EM | Admit: 2016-07-25 | Discharge: 2016-07-25 | Disposition: A | Discharge status: Home / Self Care | Payer: Self-pay | Attending: Emergency Medicine | Admitting: Emergency Medicine

## 2016-07-25 DIAGNOSIS — R0602 Shortness of breath: Secondary | ICD-10-CM | POA: Insufficient documentation

## 2016-07-25 DIAGNOSIS — E039 Hypothyroidism, unspecified: Secondary | ICD-10-CM | POA: Insufficient documentation

## 2016-07-25 LAB — BASIC METABOLIC PANEL
Anion gap: 9 (ref 5–15)
BUN: 10 mg/dL (ref 6–20)
CO2: 25 mmol/L (ref 22–32)
Calcium: 9.2 mg/dL (ref 8.9–10.3)
Chloride: 105 mmol/L (ref 101–111)
Creatinine, Ser: 0.83 mg/dL (ref 0.44–1.00)
GFR calc Af Amer: >60 mL/min (ref >60)
GFR calc non Af Amer: >60 mL/min (ref >60)
Glucose, Bld: 129 mg/dL — ABNORMAL HIGH (ref 65–99)
Potassium: 3.3 mmol/L — ABNORMAL LOW (ref 3.5–5.1)
Sodium: 139 mmol/L (ref 135–145)

## 2016-07-25 LAB — FIBRIN DERIVATIVES D-DIMER (ARMC ONLY): Fibrin derivatives D-dimer (ARMC): 413 (ref 0–499)

## 2016-07-25 LAB — CBC
HCT: 38.7 % (ref 35.0–47.0)
Hemoglobin: 13.8 g/dL (ref 12.0–16.0)
MCH: 31.2 pg (ref 26.0–34.0)
MCHC: 35.7 g/dL (ref 32.0–36.0)
MCV: 87.6 fL (ref 80.0–100.0)
Platelets: 287 10*3/uL (ref 150–440)
RBC: 4.42 MIL/uL (ref 3.80–5.20)
RDW: 13.1 % (ref 11.5–14.5)
WBC: 7.9 10*3/uL (ref 3.6–11.0)

## 2016-07-25 LAB — TROPONIN I
Troponin I: <0.03 ng/mL (ref <0.03)
Troponin I: <0.03 ng/mL (ref <0.03)

## 2016-07-25 IMAGING — CR DG CHEST 2V
2 series · 2 of 2 positions shown · non-contrast
Comparison: Chest radiograph performed [DATE]

CLINICAL DATA: Acute onset of difficulty breathing and mid chest
tightness. Initial encounter.

EXAM:
CHEST  2 VIEW

[chest pa]
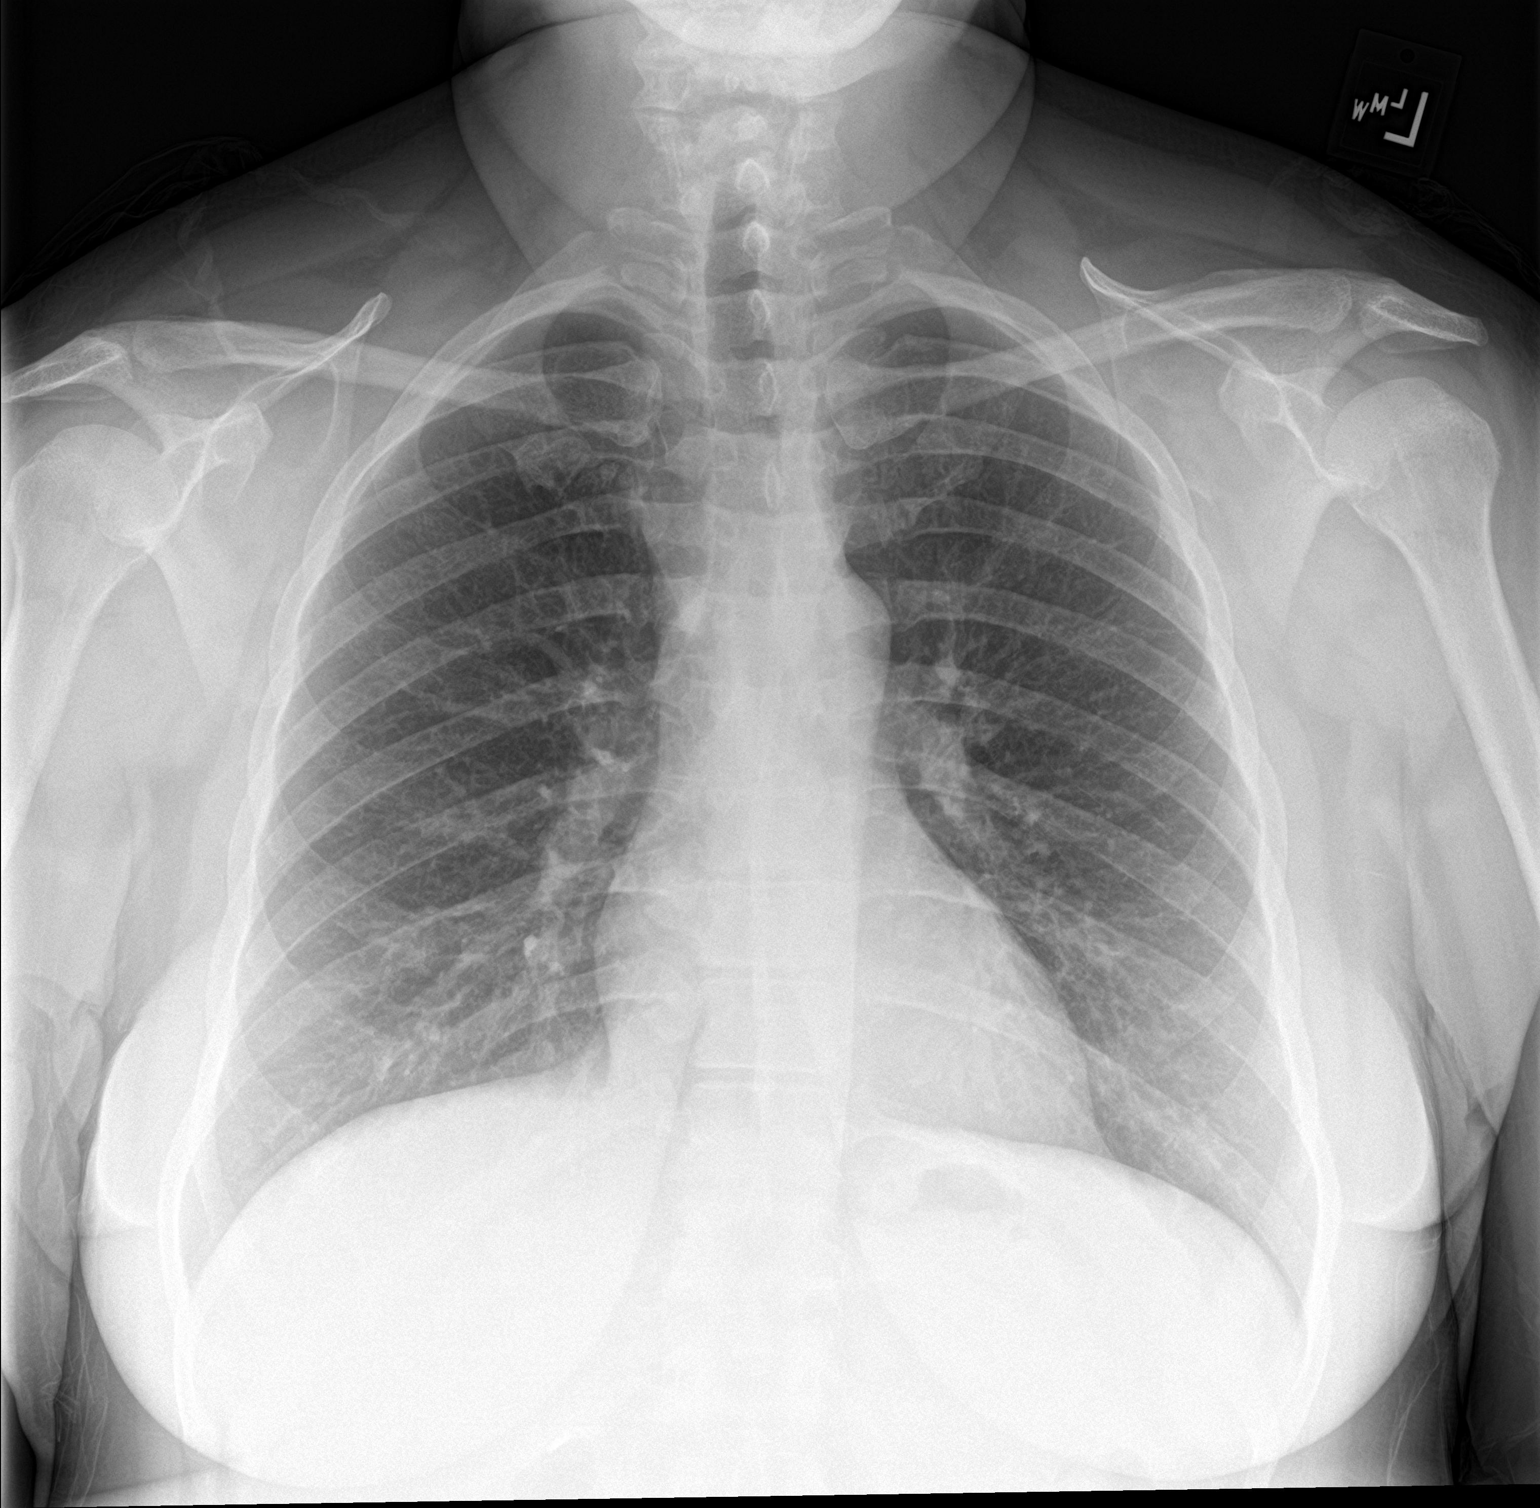

[chest lat]
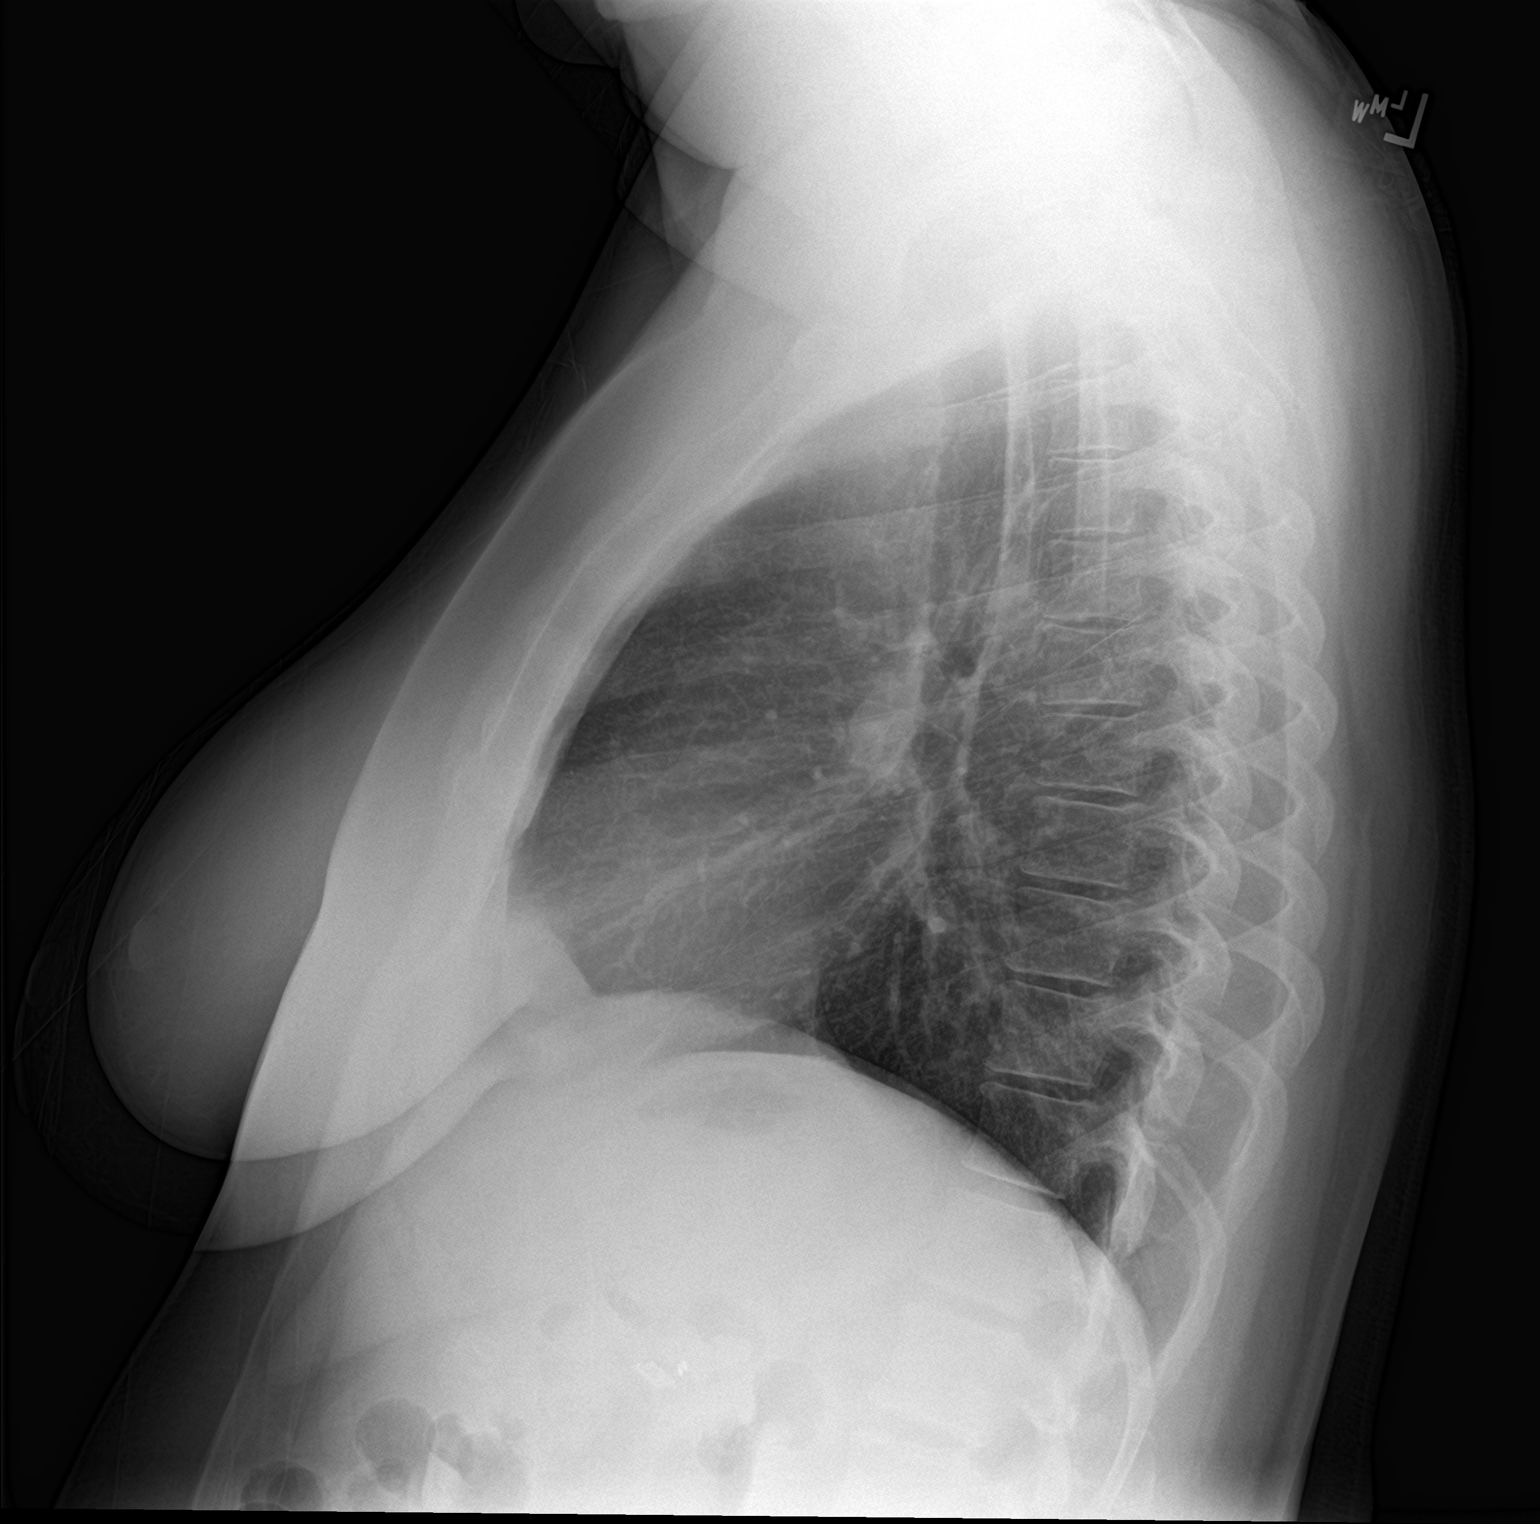

[2 of 2 positions shown; findings below may reference images not displayed]

FINDINGS: The lungs are well-aerated and clear. There is no evidence of focal
opacification, pleural effusion or pneumothorax.

The heart is normal in size; the mediastinal contour is within
normal limits. No acute osseous abnormalities are seen. S. Clips are
noted within the right upper quadrant, reflecting prior
cholecystectomy.
IMPRESSION: No acute cardiopulmonary process seen.

## 2016-07-25 NOTE — ED Provider Notes (Signed)
[facility] Emergency Department Provider Note   ____________________________________________   First MD Initiated Contact with Patient 07/25/16 0423     (approximate)  I have reviewed the triage vital signs and the nursing notes.   HISTORY  Chief Complaint Shortness of Breath    HPI Morgan Stevens is a 50 y.o. female who comes into the hospital today with some shortness of breath. The patient reports that she felt like her breathing was heavy. The symptoms started at 1 AM. The patient was falling asleep when it occurred. She has no history of asthma. The patient denies any pain with breathing. She reports that the symptoms lasted approximately one hour and the shortness of breath is currently improved. The patient tried to breathe slowly until the symptoms subsided. The patient has had some nausea with no sweats or dizziness. She also denies any pain. She had a similar episode a few weeks ago and reports that she was seen in the hospital but everything checked out okay and she was discharged to home. The patient reports that she felt as though there was a lump in her throat did not have any swelling or tightness in her throat or chest. The patient is here today for evaluation.   Past Medical History:  Diagnosis Date  . Obesity (BMI 30-39.9)   . Reflux   . Thyroid disease     Patient Active Problem List   Diagnosis Date Noted  . Pain in the chest   . Abnormal cardiac function test 02/26/2016  . Obesity (BMI 30-39.9)   . Angina pectoris (HCC) 02/25/2016  . Dyspnea 02/25/2016  . Hypothyroidism 02/25/2016  . Chest pain 09/07/2015    Past Surgical History:  Procedure Laterality Date  . CARDIAC CATHETERIZATION  02/27/2016   Procedure: Left Heart Cath and Coronary Angiography;  Surgeon: Corky CraftsJayadeep S Varanasi, MD;  [facility];  Service: Cardiovascular;;  . CESAREAN SECTION    . CHOLECYSTECTOMY    . THYROIDECTOMY      Prior to Admission  medications   Medication Sig Start Date End Date Taking? Authorizing Provider  ibuprofen (ADVIL,MOTRIN) 800 MG tablet Take 1 tablet (800 mg total) by mouth every 8 (eight) hours as needed for mild pain or moderate pain. 07/08/16   Nita Sicklearolina Veronese, MD  levothyroxine (SYNTHROID, LEVOTHROID) 137 MCG tablet Take 137 mcg by mouth daily before breakfast.    Historical Provider, MD  naproxen (NAPROSYN) 500 MG tablet Take 1 tablet (500 mg total) by mouth 2 (two) times daily with a meal. 04/11/16   Jami L Hagler, PA-C  oxyCODONE-acetaminophen (ROXICET) 5-325 MG tablet Take 1 tablet by mouth every 4 (four) hours as needed for severe pain. 07/08/16   Nita Sicklearolina Veronese, MD    Allergies Patient has no known allergies.  No family history on file.  Social History Social History  Substance Use Topics  . Smoking status: Never Smoker  . Smokeless tobacco: Never Used  . Alcohol use No    Review of Systems Constitutional: No fever/chills Eyes: No visual changes. ENT: No sore throat. Cardiovascular: Denies chest pain. Respiratory:  shortness of breath. Gastrointestinal: No abdominal pain.  No nausea, no vomiting.  No diarrhea.  No constipation. Genitourinary: Negative for dysuria. Musculoskeletal: Negative for back pain. Skin: Negative for rash. Neurological: Negative for headaches, focal weakness or numbness.  10-point ROS otherwise negative.  ____________________________________________   PHYSICAL EXAM:  VITAL SIGNS: ED Triage Vitals  Enc Vitals Group     BP 07/25/16  0246 (!) 144/67     Pulse Rate 07/25/16 0246 76     Resp 07/25/16 0246 20     Temp 07/25/16 0246 98 F (36.7 C)     Temp Source 07/25/16 0246 Oral     SpO2 07/25/16 0246 100 %     Weight 07/25/16 0243 208 lb (94.3 kg)     Height 07/25/16 0243 5\' 4"  (1.626 m)     Head Circumference --      Peak Flow --      Pain Score 07/25/16 0400 0     Pain Loc --      Pain Edu? --      Excl. in GC? --     Constitutional: Alert  and oriented. Well appearing and in no acute distress. Eyes: Conjunctivae are normal. PERRL. EOMI. Head: Atraumatic. Nose: No congestion/rhinnorhea. Mouth/Throat: Mucous membranes are moist.  Oropharynx non-erythematous. Cardiovascular: Normal rate, regular rhythm. Grossly normal heart sounds.  Good peripheral circulation. Respiratory: Normal respiratory effort.  No retractions. Lungs CTAB. Gastrointestinal: Soft and nontender. No distention. Positive bowel sounds Musculoskeletal: No lower extremity tenderness nor edema.   Neurologic:  Normal speech and language.  Skin:  Skin is warm, dry and intact. Marland Kitchen. Psychiatric: Mood and affect are normal.   ____________________________________________   LABS (all labs ordered are listed, but only abnormal results are displayed)  Labs Reviewed  BASIC METABOLIC PANEL - Abnormal; Notable for the following:       Result Value   Potassium 3.3 (*)    Glucose, Bld 129 (*)    All other components within normal limits  CBC  TROPONIN I  FIBRIN DERIVATIVES D-DIMER ([facility] ONLY)  TROPONIN I   ____________________________________________  EKG  ED ECG REPORT I, Rebecka ApleyWebster,  Fenton Candee P, the attending physician, personally viewed and interpreted this ECG.   Date: 07/25/2016  EKG Time: 249  Rate: 69  Rhythm: normal sinus rhythm  Axis: normal  Intervals:none  ST&T Change: none  ____________________________________________  RADIOLOGY  CXR ____________________________________________   PROCEDURES  Procedure(s) performed: None  Procedures  Critical Care performed: No  ____________________________________________   INITIAL IMPRESSION / ASSESSMENT AND PLAN / ED COURSE  Pertinent labs & imaging results that were available during my care of the patient were reviewed by me and considered in my medical decision making (see chart for details).  His is a 50 year old female who comes into the hospital today with some shortness of breath. The  patient's symptoms were short-lived and have resolved. I did check a d-dimer as well as 2 troponins. The patient's blood work is unremarkable. Her imaging is also unremarkable. I am unsure what causing the patient's symptoms but she should follow-up with her primary care physician for further evaluation of the shortness of breath symptoms. The patient has no further complaints or concerns and she'll be discharged home.  Clinical Course as of Jul 25 618  Wed Jul 25, 2016  0440 No acute cardiopulmonary process seen. DG Chest 2 View [AW]    Clinical Course User Index [AW] Rebecka ApleyAllison P Jayleon Mcfarlane, MD     ____________________________________________   FINAL CLINICAL IMPRESSION(S) / ED DIAGNOSES  Final diagnoses:  Shortness of breath      NEW MEDICATIONS STARTED DURING THIS VISIT:  New Prescriptions   No medications on file     Note:  This document was prepared using Dragon voice recognition software and may include unintentional dictation errors.    Rebecka ApleyAllison P Ellis Mehaffey, MD 07/25/16 [phone]

## 2016-07-25 NOTE — ED Triage Notes (Signed)
Patient ambulatory to triage with steady gait, without difficulty or distress noted; pt reports last hr having sensation of difficulty breathing and tightness to middle of chest; st hx of same but no dx; denies any recent illness

## 2016-07-28 ENCOUNTER — Emergency Department: Payer: Self-pay

## 2016-07-28 ENCOUNTER — Other Ambulatory Visit: Payer: Self-pay

## 2016-07-28 ENCOUNTER — Emergency Department
Admission: EM | Admit: 2016-07-28 | Discharge: 2016-07-28 | Disposition: A | Discharge status: Home / Self Care | Payer: Self-pay | Attending: Student in an Organized Health Care Education/Training Program | Admitting: Student in an Organized Health Care Education/Training Program

## 2016-07-28 ENCOUNTER — Encounter: Payer: Self-pay | Admitting: Emergency Medicine

## 2016-07-28 DIAGNOSIS — Z79899 Other long term (current) drug therapy: Secondary | ICD-10-CM | POA: Insufficient documentation

## 2016-07-28 DIAGNOSIS — E039 Hypothyroidism, unspecified: Secondary | ICD-10-CM | POA: Insufficient documentation

## 2016-07-28 DIAGNOSIS — R0602 Shortness of breath: Secondary | ICD-10-CM | POA: Insufficient documentation

## 2016-07-28 DIAGNOSIS — R0789 Other chest pain: Secondary | ICD-10-CM | POA: Insufficient documentation

## 2016-07-28 LAB — BASIC METABOLIC PANEL
Anion gap: 8 (ref 5–15)
BUN: 11 mg/dL (ref 6–20)
CO2: 24 mmol/L (ref 22–32)
Calcium: 9.4 mg/dL (ref 8.9–10.3)
Chloride: 107 mmol/L (ref 101–111)
Creatinine, Ser: 0.78 mg/dL (ref 0.44–1.00)
GFR calc Af Amer: >60 mL/min (ref >60)
GFR calc non Af Amer: >60 mL/min (ref >60)
Glucose, Bld: 98 mg/dL (ref 65–99)
Potassium: 3.7 mmol/L (ref 3.5–5.1)
Sodium: 139 mmol/L (ref 135–145)

## 2016-07-28 LAB — CBC
HCT: 34.5 % — ABNORMAL LOW (ref 35.0–47.0)
Hemoglobin: 12.3 g/dL (ref 12.0–16.0)
MCH: 31.5 pg (ref 26.0–34.0)
MCHC: 35.6 g/dL (ref 32.0–36.0)
MCV: 88.5 fL (ref 80.0–100.0)
Platelets: 297 10*3/uL (ref 150–440)
RBC: 3.9 MIL/uL (ref 3.80–5.20)
RDW: 13.3 % (ref 11.5–14.5)
WBC: 7 10*3/uL (ref 3.6–11.0)

## 2016-07-28 LAB — TROPONIN I
Troponin I: <0.03 ng/mL (ref <0.03)
Troponin I: <0.03 ng/mL (ref <0.03)

## 2016-07-28 LAB — LIPASE, BLOOD: Lipase: 38 U/L (ref 11–51)

## 2016-07-28 IMAGING — CR DG CHEST 2V
2 series · 2 of 2 positions shown · non-contrast
Comparison: [DATE]

CLINICAL DATA: Pt says about one hour ago she was shopping when she
began having pressure to the center of her chest that radiates
straight through to her back; pressure in her chest, burning and
sharp in her back.

EXAM:
CHEST  2 VIEW

[chest pa]
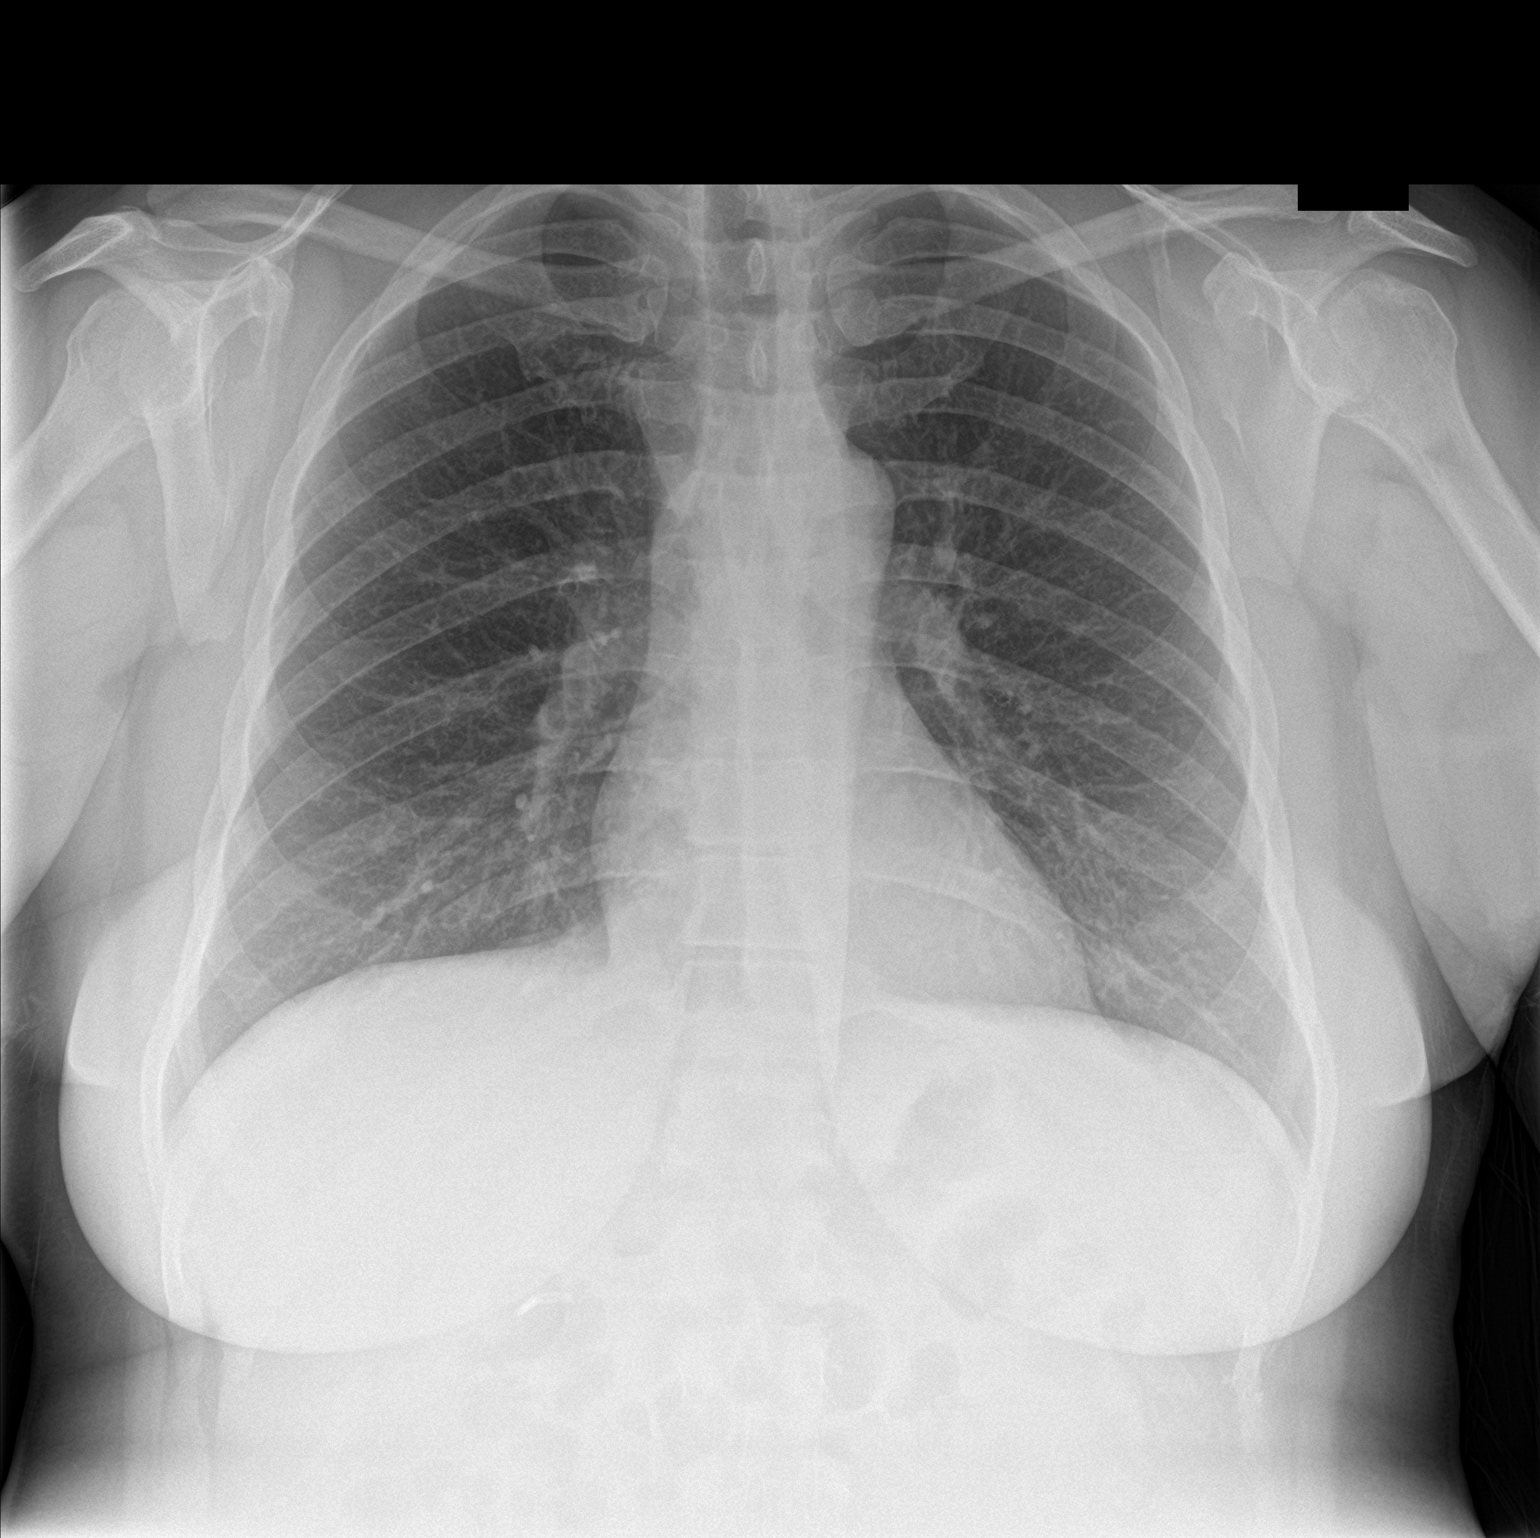

[chest lat]
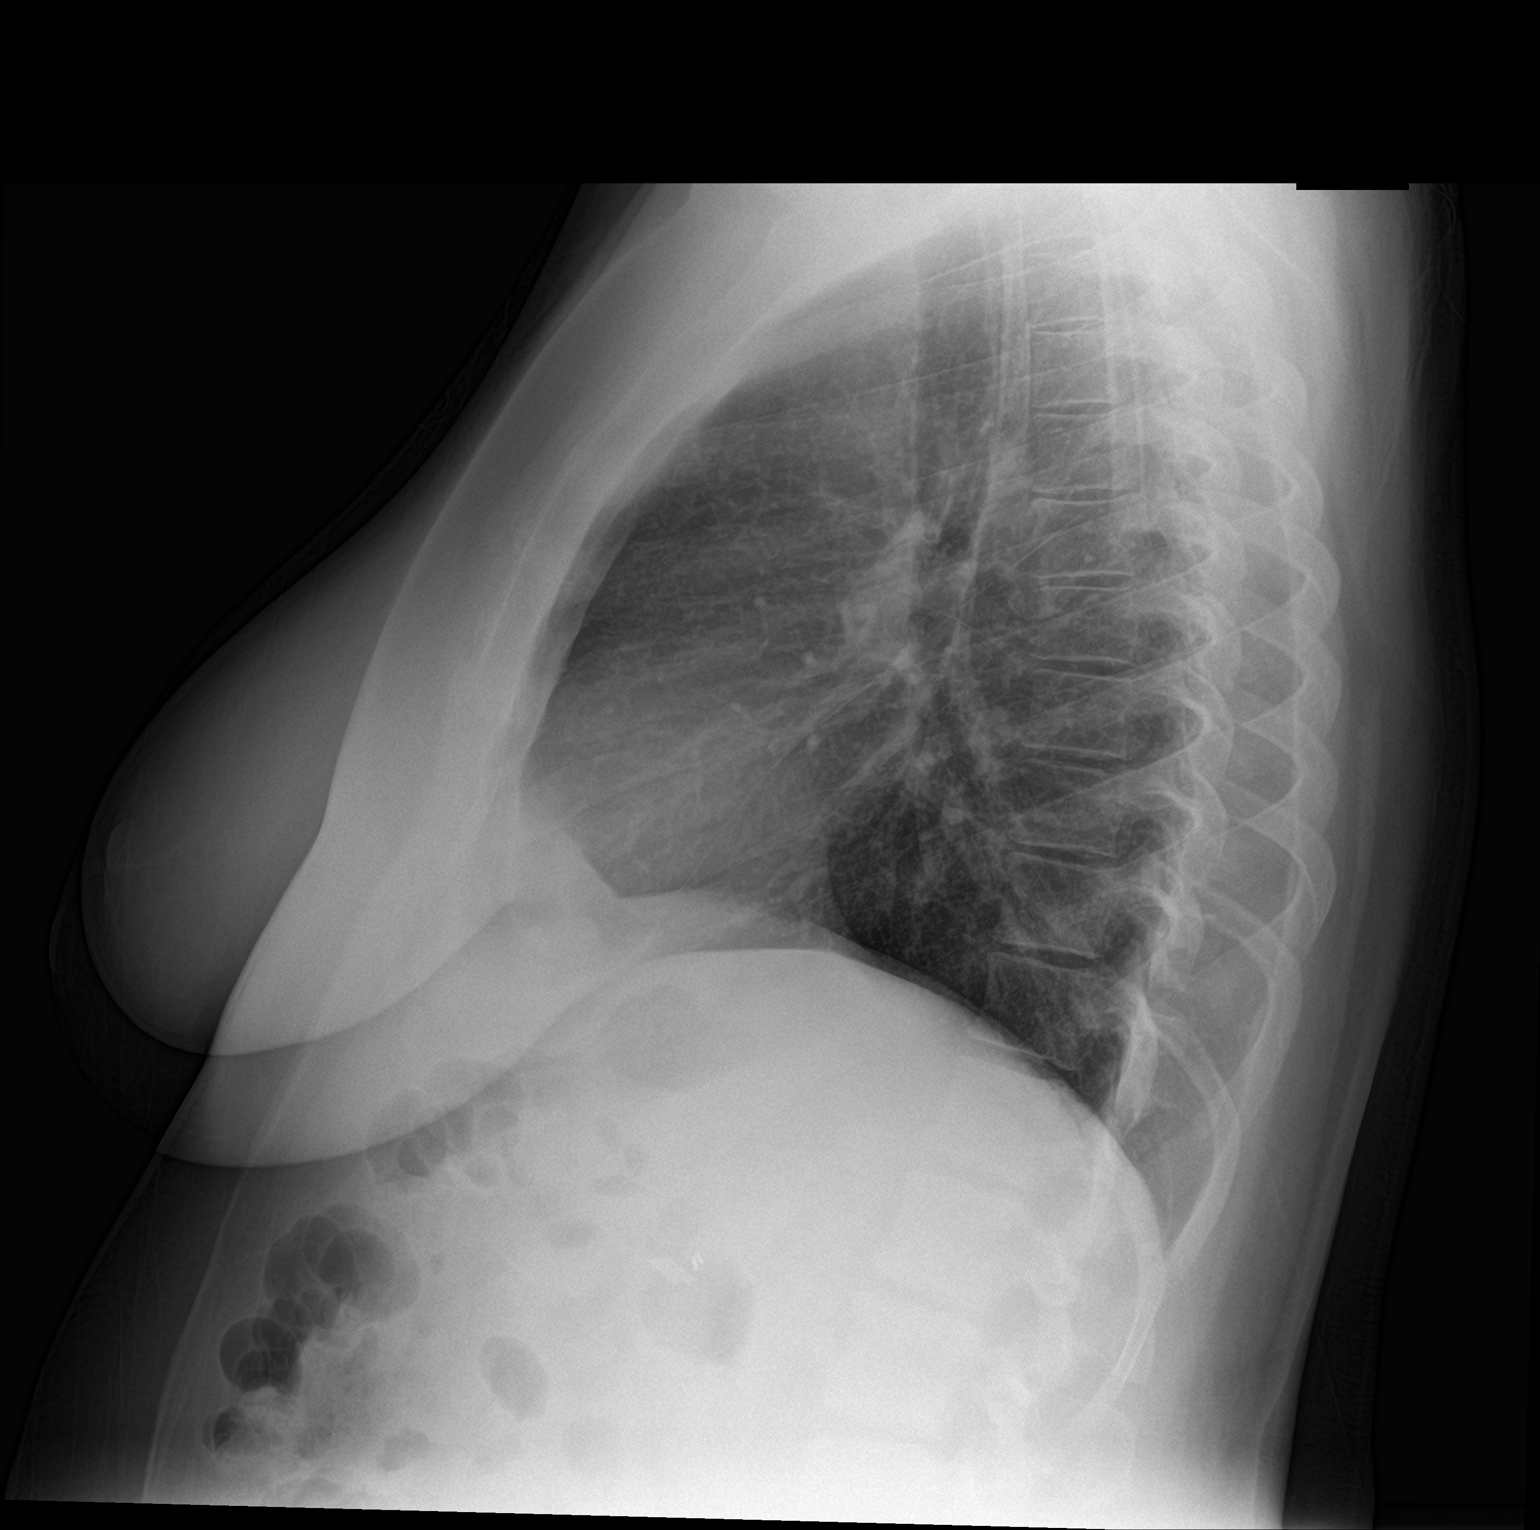

[2 of 2 positions shown; findings below may reference images not displayed]

FINDINGS: The heart size and mediastinal contours are within normal limits.
Both lungs are clear. No pleural effusion or pneumothorax. The
visualized skeletal structures are unremarkable.
IMPRESSION: Normal chest radiographs.

## 2016-07-28 MED ORDER — RANITIDINE HCL 150 MG PO TABS: 150.0000 mg | ORAL_TABLET | Freq: Two times a day (BID) | ORAL | 0 refills | Status: DC

## 2016-07-28 MED ORDER — LORAZEPAM 1 MG PO TABS
1.0000 mg | ORAL_TABLET | Freq: Once | ORAL | Status: AC
  Administered 2016-07-28: 1 mg via ORAL
  Filled 2016-07-28: qty 1

## 2016-07-28 MED ORDER — GI COCKTAIL ~~LOC~~
30.0000 mL | Freq: Once | ORAL | Status: AC
  Administered 2016-07-28: 30 mL via ORAL
  Filled 2016-07-28: qty 30

## 2016-07-28 NOTE — ED Provider Notes (Signed)
[facility] Emergency Department Provider Note    First MD Initiated Contact with Patient 07/28/16 2205     (approximate)  I have reviewed the triage vital signs and the nursing notes.   HISTORY  Chief Complaint Chest Pain and Back Pain    HPI Morgan Stevens is a 50 y.o. female who is well-known to this department presents with chest pain, shortness of breath and back pain that occurred while she was shopping at already today. Describes the pain as a pressure and overwhleming sense of doom. States she's also had some shortness of breath. Denies any pain radiating to her jaw or shoulder. No diaphoresis. Denies any abdominal pain nausea or diarrhea. No recent fevers. She does not have a history of hypertension, hyperlipidemia, diabetes. She does not smoke. Just finished her menstrual cycle.   Past Medical History:  Diagnosis Date  . Obesity (BMI 30-39.9)   . Reflux   . Thyroid disease    History reviewed. No pertinent family history. Past Surgical History:  Procedure Laterality Date  . CARDIAC CATHETERIZATION  02/27/2016   Procedure: Left Heart Cath and Coronary Angiography;  Surgeon: Corky CraftsJayadeep S Varanasi, MD;  [facility];  Service: Cardiovascular;;  . CESAREAN SECTION    . CHOLECYSTECTOMY    . THYROIDECTOMY     Patient Active Problem List   Diagnosis Date Noted  . Pain in the chest   . Abnormal cardiac function test 02/26/2016  . Obesity (BMI 30-39.9)   . Angina pectoris (HCC) 02/25/2016  . Dyspnea 02/25/2016  . Hypothyroidism 02/25/2016  . Chest pain 09/07/2015      Prior to Admission medications   Medication Sig Start Date End Date Taking? Authorizing Provider  levothyroxine (SYNTHROID, LEVOTHROID) 137 MCG tablet Take 137 mcg by mouth daily before breakfast.   Yes Historical Provider, MD  ranitidine (ZANTAC) 150 MG tablet Take 1 tablet (150 mg total) by mouth 2 (two) times daily. 07/28/16 07/28/17  Willy EddyPatrick Gera Inboden, MD     Allergies Patient has no known allergies.    Social History Social History  Substance Use Topics  . Smoking status: Never Smoker  . Smokeless tobacco: Never Used  . Alcohol use No    Review of Systems Patient denies headaches, rhinorrhea, blurry vision, numbness, shortness of breath, chest pain, edema, cough, abdominal pain, nausea, vomiting, diarrhea, dysuria, fevers, rashes or hallucinations unless otherwise stated above in HPI. ____________________________________________   PHYSICAL EXAM:  VITAL SIGNS: Vitals:   07/28/16 1929 07/28/16 2349  BP: 129/79 130/77  Pulse: 72 63  Resp: 18 18  Temp: 98.5 F (36.9 C) 98.7 F (37.1 C)    Constitutional: Alert and oriented. Well appearing and in no acute distress. Eyes: Conjunctivae are normal. PERRL. EOMI. Head: Atraumatic. Nose: No congestion/rhinnorhea. Mouth/Throat: Mucous membranes are moist.  Oropharynx non-erythematous. Neck: No stridor. Painless ROM. No cervical spine tenderness to palpation Hematological/Lymphatic/Immunilogical: No cervical lymphadenopathy. Cardiovascular: Normal rate, regular rhythm. Grossly normal heart sounds.  Good peripheral circulation. Respiratory: Normal respiratory effort.  No retractions. Lungs CTAB. Gastrointestinal: Soft and nontender. No distention. No abdominal bruits. No CVA tenderness. Genitourinary:  Musculoskeletal: No lower extremity tenderness nor edema.  No joint effusions. Neurologic:  Normal speech and language. No gross focal neurologic deficits are appreciated. No gait instability. Skin:  Skin is warm, dry and intact. No rash noted. Psychiatric: Mood and affect are normal. Speech and behavior are normal.  ____________________________________________   LABS (all labs ordered are listed, but only abnormal results  are displayed)  Results for orders placed or performed during the hospital encounter of 07/28/16 (from the past 24 hour(s))  Basic metabolic panel      Status: None   Collection Time: 07/28/16  7:29 PM  Result Value Ref Range   Sodium 139 135 - 145 mmol/L   Potassium 3.7 3.5 - 5.1 mmol/L   Chloride 107 101 - 111 mmol/L   CO2 24 22 - 32 mmol/L   Glucose, Bld 98 65 - 99 mg/dL   BUN 11 6 - 20 mg/dL   Creatinine, Ser 1.61 0.44 - 1.00 mg/dL   Calcium 9.4 8.9 - 09.6 mg/dL   GFR calc non Af Amer >60 >60 mL/min   GFR calc Af Amer >60 >60 mL/min   Anion gap 8 5 - 15  CBC     Status: Abnormal   Collection Time: 07/28/16  7:29 PM  Result Value Ref Range   WBC 7.0 3.6 - 11.0 K/uL   RBC 3.90 3.80 - 5.20 MIL/uL   Hemoglobin 12.3 12.0 - 16.0 g/dL   HCT 04.5 (L) 40.9 - 81.1 %   MCV 88.5 80.0 - 100.0 fL   MCH 31.5 26.0 - 34.0 pg   MCHC 35.6 32.0 - 36.0 g/dL   RDW 91.4 78.2 - 95.6 %   Platelets 297 150 - 440 K/uL  Troponin I     Status: None   Collection Time: 07/28/16  7:29 PM  Result Value Ref Range   Troponin I <0.03 <0.03 ng/mL  Lipase, blood     Status: None   Collection Time: 07/28/16  7:29 PM  Result Value Ref Range   Lipase 38 11 - 51 U/L  Troponin I     Status: None   Collection Time: 07/28/16 10:54 PM  Result Value Ref Range   Troponin I <0.03 <0.03 ng/mL   ____________________________________________  EKG My review and personal interpretation at Time: 17:18   Indication: chest pain  Rate: 80  Rhythm: sinus Axis: normal Other: normal intervals, no acute ischemia ____________________________________________  RADIOLOGY  I personally reviewed all radiographic images ordered to evaluate for the above acute complaints and reviewed radiology reports and findings.  These findings were personally discussed with the patient.  Please see medical record for radiology report. ____________________________________________   PROCEDURES  Procedure(s) performed: none Procedures    Critical Care performed: no ____________________________________________   INITIAL IMPRESSION / ASSESSMENT AND PLAN / ED COURSE  Pertinent labs &  imaging results that were available during my care of the patient were reviewed by me and considered in my medical decision making (see chart for details).  DDX: ACS, pericarditis, esophagitis, boerhaaves, pe, dissection, pna, bronchitis, costochondritis   Morgan Stevens is a 51 y.o. who presents to the ED with chest pain and shortness of breath with radiation to the back. Patient afebrile and hemodynamically stable. Patient is low risk heart score. Initial EKG shows no evidence of acute ischemia. Initial troponin is negative. Chest x-ray ordered due to concern for pneumonia and pneumothorax versus pneumonia shows no evidence of acute intrathoracic abnormality. Is less consistent with dissection as she has a crisp aortic knob without any mediastinal widening, hypertension or tachycardia. Her abdominal exam is reassuring. She is low risk Wells and perc negative.  We'll provide symptomatically treatment and repeat troponin to further risk stratify for ACS.  Clinical Course as of Jul 29 2  Sat Jul 28, 2016  2325 Repeat troponin is negative.  I do feel this presentation is  most consistent with anxiety. Not clinically consistent with dissection as she has no straight hypertension, neuro deficits with no tachycardia and equal pulses in bilateral upper extremities.  Patient with complete resolution after Ativan and GI cocktail. We'll provide prescription for Zantac and referral for cardiology.  Have discussed with the patient and available family all diagnostics and treatments performed thus far and all questions were answered to the best of my ability. The patient demonstrates understanding and agreement with plan.   [PR]    Clinical Course User Index [PR] Willy EddyPatrick Toua Stites, MD     ____________________________________________   FINAL CLINICAL IMPRESSION(S) / ED DIAGNOSES  Final diagnoses:  Atypical chest pain  Shortness of breath      NEW MEDICATIONS STARTED DURING THIS VISIT:  Discharge  Medication List as of 07/28/2016 11:36 PM    START taking these medications   Details  ranitidine (ZANTAC) 150 MG tablet Take 1 tablet (150 mg total) by mouth 2 (two) times daily., Starting Sat 07/28/2016, Until Sun 07/28/2017, Print         Note:  This document was prepared using Dragon voice recognition software and may include unintentional dictation errors.    Willy EddyPatrick Dayven Linsley, MD 07/29/16 [phone]

## 2016-07-28 NOTE — ED Triage Notes (Addendum)
Pt says about one hour ago she was shopping when she began having pressure to the center of her chest that radiates straight through to her back; pressure in her chest, burning and sharp in her back; also having epigastric pain; history of cholecystectomy; pt awake and alert; oriented x 3; talking in complete coherent sentences; pt was seen here several days ago with shortness of breath and discharged home from ED

## 2016-07-28 NOTE — ED Notes (Signed)
EKG performed and signed off by MD

## 2016-08-06 ENCOUNTER — Encounter: Payer: Self-pay | Admitting: Emergency Medicine

## 2016-08-06 DIAGNOSIS — Z79899 Other long term (current) drug therapy: Secondary | ICD-10-CM | POA: Insufficient documentation

## 2016-08-06 DIAGNOSIS — R200 Anesthesia of skin: Secondary | ICD-10-CM | POA: Insufficient documentation

## 2016-08-06 DIAGNOSIS — R51 Headache: Secondary | ICD-10-CM | POA: Insufficient documentation

## 2016-08-06 DIAGNOSIS — M542 Cervicalgia: Secondary | ICD-10-CM | POA: Insufficient documentation

## 2016-08-06 DIAGNOSIS — R202 Paresthesia of skin: Secondary | ICD-10-CM | POA: Insufficient documentation

## 2016-08-06 DIAGNOSIS — E039 Hypothyroidism, unspecified: Secondary | ICD-10-CM | POA: Insufficient documentation

## 2016-08-06 LAB — CBC
HCT: 37.3 % (ref 35.0–47.0)
Hemoglobin: 12.9 g/dL (ref 12.0–16.0)
MCH: 30.7 pg (ref 26.0–34.0)
MCHC: 34.7 g/dL (ref 32.0–36.0)
MCV: 88.5 fL (ref 80.0–100.0)
Platelets: 314 10*3/uL (ref 150–440)
RBC: 4.22 MIL/uL (ref 3.80–5.20)
RDW: 13.2 % (ref 11.5–14.5)
WBC: 9.3 10*3/uL (ref 3.6–11.0)

## 2016-08-06 LAB — BASIC METABOLIC PANEL
Anion gap: 9 (ref 5–15)
BUN: 11 mg/dL (ref 6–20)
CO2: 24 mmol/L (ref 22–32)
Calcium: 9.3 mg/dL (ref 8.9–10.3)
Chloride: 103 mmol/L (ref 101–111)
Creatinine, Ser: 0.77 mg/dL (ref 0.44–1.00)
GFR calc Af Amer: >60 mL/min (ref >60)
GFR calc non Af Amer: >60 mL/min (ref >60)
Glucose, Bld: 103 mg/dL — ABNORMAL HIGH (ref 65–99)
Potassium: 3.6 mmol/L (ref 3.5–5.1)
Sodium: 136 mmol/L (ref 135–145)

## 2016-08-06 LAB — TROPONIN I: Troponin I: <0.03 ng/mL (ref <0.03)

## 2016-08-06 NOTE — ED Triage Notes (Signed)
Pt ambulatory to triage with steady gait with c/o left neck pain radiating to jaw and left side of head starting about 6 pm tonight. Pt denies vision problems, abd pain, or sob. Pt states feeling "chest heavy when I walk." Pt alert and oriented x 4, respirations even and unlabored. Bilateral equal hand grips.

## 2016-08-07 ENCOUNTER — Encounter: Payer: Self-pay | Admitting: Radiology

## 2016-08-07 ENCOUNTER — Emergency Department
Admission: EM | Admit: 2016-08-07 | Discharge: 2016-08-07 | Disposition: A | Discharge status: Home / Self Care | Payer: Self-pay | Attending: Emergency Medicine | Admitting: Emergency Medicine

## 2016-08-07 ENCOUNTER — Emergency Department: Payer: Self-pay

## 2016-08-07 DIAGNOSIS — M542 Cervicalgia: Secondary | ICD-10-CM

## 2016-08-07 DIAGNOSIS — R202 Paresthesia of skin: Secondary | ICD-10-CM

## 2016-08-07 LAB — TROPONIN I: Troponin I: <0.03 ng/mL (ref <0.03)

## 2016-08-07 IMAGING — CT CT ANGIO NECK
2 of 11 series · 8 of 33 positions shown · IV contrast (APPLIED)
Comparison: CT angiogram head and neck dated [DATE].

CLINICAL DATA: 50 y/o F; left head and neck pain with some facial
and tongue numbness.

EXAM:
CT ANGIOGRAPHY HEAD AND NECK
TECHNIQUE: Multidetector CT imaging of the head and neck was performed using
the standard protocol during bolus administration of intravenous
contrast. Multiplanar CT image reconstructions and MIPs were
obtained to evaluate the vascular anatomy. Carotid stenosis
measurements (when applicable) are obtained utilizing NASCET
criteria, using the distal internal carotid diameter as the
denominator.
CONTRAST:  75 cc Isovue 370

[Series 8: cta head neck · axial · 0.49mm/px · z∈[-351,-7]mm · 3 of 173 slices shown]
[im 1/173  soft-tissue]
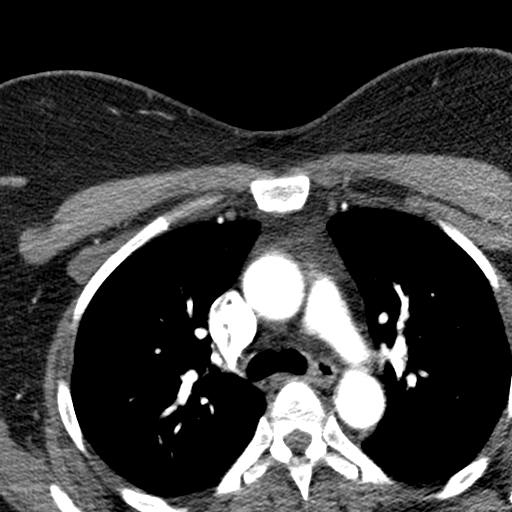
[im 87/173  bone]
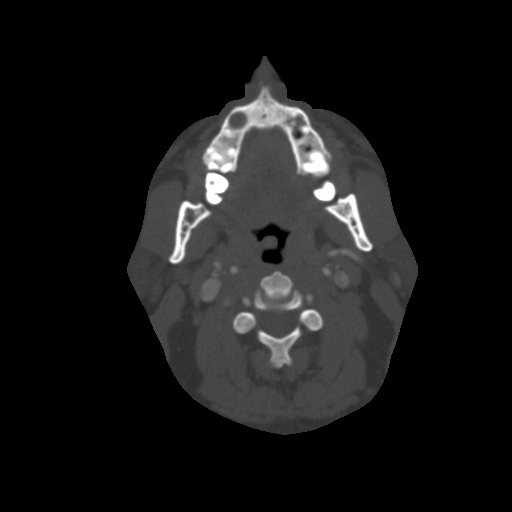
[im 173/173  soft-tissue]
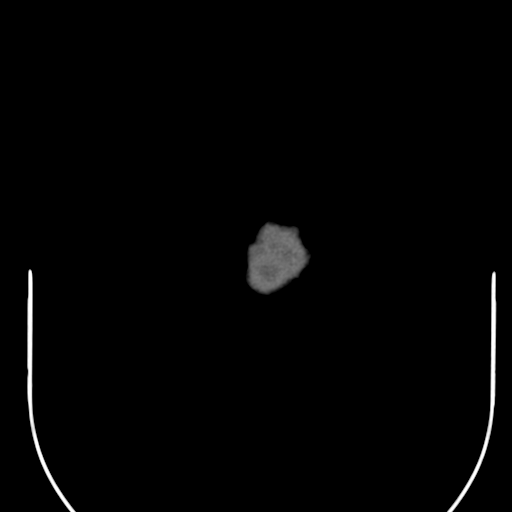

[Series 10: ax thin · axial · 0.43mm/px · z∈[-294,-68]mm · 5 of 340 slices shown]
[im 57/340  soft-tissue]
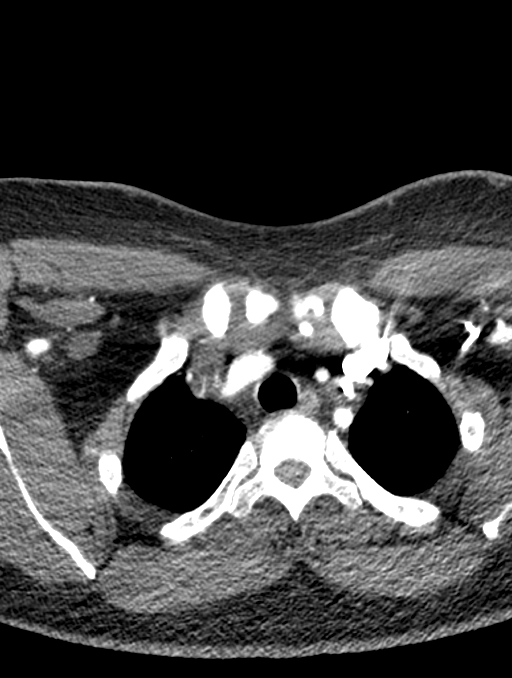
[im 114/340  soft-tissue]
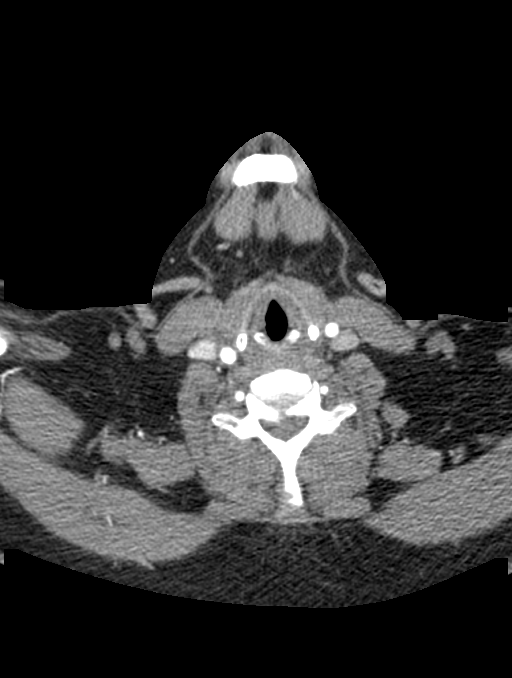
[im 170/340  soft-tissue]
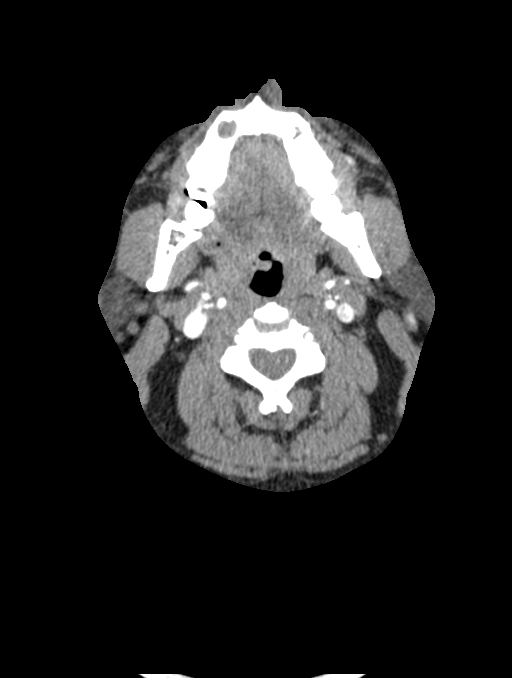
[im 227/340  soft-tissue]
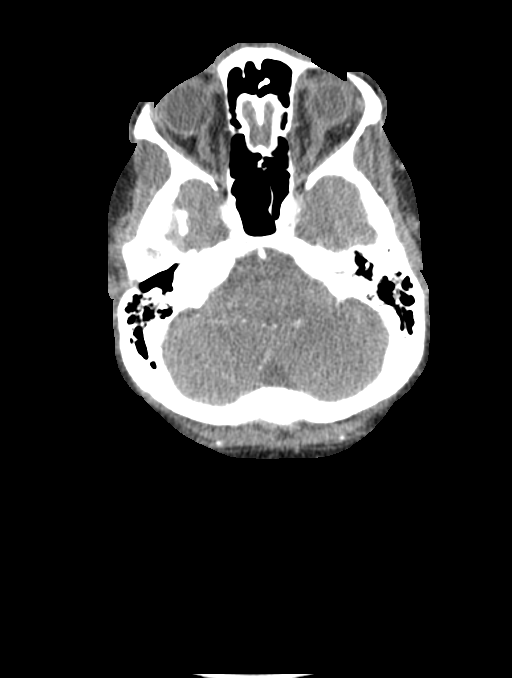
[im 283/340  soft-tissue]
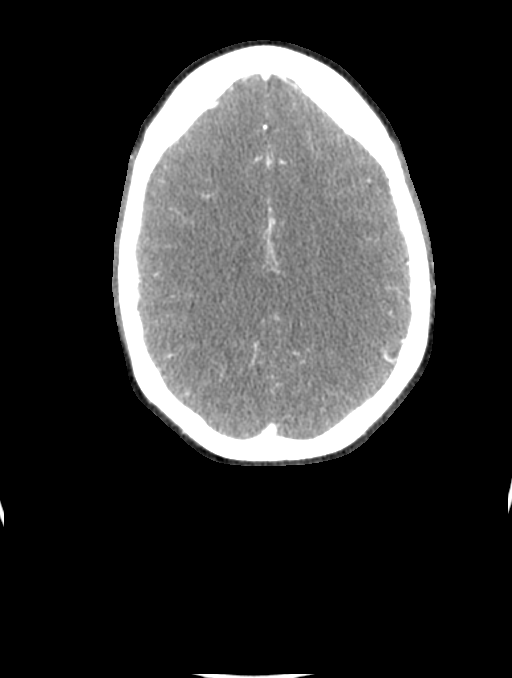

[8 of 33 positions shown; findings below may reference images not displayed]

FINDINGS: CT HEAD FINDINGS

Brain: No evidence of acute infarction, hemorrhage, hydrocephalus,
extra-axial collection or mass lesion/mass effect.

Vascular: As below.

Skull: Normal. Negative for fracture or focal lesion.

Sinuses: Imaged portions are clear.

Orbits: No acute finding.

Review of the MIP images confirms the above findings

CTA NECK FINDINGS

Aortic arch: Standard branching. Imaged portion shows no evidence of
aneurysm or dissection. No significant stenosis of the major arch
vessel origins.

Right carotid system: No evidence of dissection, stenosis (50% or
greater) or occlusion.

Left carotid system: No evidence of dissection, stenosis (50% or
greater) or occlusion.

Vertebral arteries: Codominant. No evidence of dissection, stenosis
(50% or greater) or occlusion.

Skeleton: Mild cervical spondylosis greatest at the C5 through C7
levels. No high-grade bony canal stenosis or foraminal narrowing.

Other neck: Status post thyroidectomy. No lymphadenopathy by imaging
criteria or abnormal lymph node density.

Upper chest: Negative.

Review of the MIP images confirms the above findings

CTA HEAD FINDINGS

Anterior circulation: No significant stenosis, proximal occlusion,
aneurysm, or vascular malformation. Stable 2 mm right supraclinoid
outpouching, appears to be the infundibular origin of right
posterior communicating artery.

Posterior circulation: No significant stenosis, proximal occlusion,
aneurysm, or vascular malformation.

Venous sinuses: As permitted by contrast timing, patent.

Anatomic variants: Small anterior communicating artery and
diminutive right posterior communicating artery. No left posterior
communicating artery identified, likely hypoplastic or absent.

Delayed phase: No abnormal intracranial enhancement.

Review of the MIP images confirms the above findings
IMPRESSION: 1. No evidence of dissection, hemodynamically significant stenosis,
or occlusion of the carotid and vertebral arteries of the neck.
2. No significant stenosis, proximal occlusion, aneurysm, or
vascular malformation of the circle of Willis.
3. Mild cervical spondylosis at the C5 through C7 levels, no
high-grade bony canal stenosis or foraminal narrowing.
4. Status post thyroidectomy.

By: MICKENS M.D.

## 2016-08-07 MED ORDER — IOPAMIDOL (ISOVUE-370) INJECTION 76%
75.0000 mL | Freq: Once | INTRAVENOUS | Status: AC | PRN
  Administered 2016-08-07: 75 mL via INTRAVENOUS

## 2016-08-07 NOTE — ED Provider Notes (Signed)
[facility] Emergency Department Provider Note   ____________________________________________   First MD Initiated Contact with Patient 08/07/16 [phone]     (approximate)  I have reviewed the triage vital signs and the nursing notes.   HISTORY  Chief Complaint Neck Pain and Jaw Pain    HPI Tauheedah Bok is a 50 y.o. female who comes into the hospital today with pains in her shoulder. She reports that it started going up into her neck. The pain started to go into her ear and her head. She reports that she felt some tingling to her lower face on the left and her tongue felt heavy and numb. She reports that the symptoms are 8 PM. The patient had a headache and reports that she did not take any medications for it. The headache is currently gone but her tongue and facial seem a little bit numb. She reports that she still has the neck and shoulder pain as well. The patient rates the pain a 6 out of 10 in intensity. She's never had this before. The patient denies any chest pain or shortness of breath and denies any nausea or vomiting. She reports that she does have some lightheadedness, no abdominal pain and no back pain. The patient is here today for evaluation of these symptoms.   Past Medical History:  Diagnosis Date  . Obesity (BMI 30-39.9)   . Reflux   . Thyroid disease     Patient Active Problem List   Diagnosis Date Noted  . Pain in the chest   . Abnormal cardiac function test 02/26/2016  . Obesity (BMI 30-39.9)   . Angina pectoris (HCC) 02/25/2016  . Dyspnea 02/25/2016  . Hypothyroidism 02/25/2016  . Chest pain 09/07/2015    Past Surgical History:  Procedure Laterality Date  . CARDIAC CATHETERIZATION  02/27/2016   Procedure: Left Heart Cath and Coronary Angiography;  Surgeon: Corky Crafts, MD;  Location: [facility] INVASIVE CV LAB;  Service: Cardiovascular;;  . CESAREAN SECTION    . CHOLECYSTECTOMY    . THYROIDECTOMY      Prior to Admission  medications   Medication Sig Start Date End Date Taking? Authorizing Provider  levothyroxine (SYNTHROID, LEVOTHROID) 137 MCG tablet Take 137 mcg by mouth daily before breakfast.    Historical Provider, MD  ranitidine (ZANTAC) 150 MG tablet Take 1 tablet (150 mg total) by mouth 2 (two) times daily. 07/28/16 07/28/17  Willy Eddy, MD    Allergies Patient has no known allergies.  No family history on file.  Social History Social History  Substance Use Topics  . Smoking status: Never Smoker  . Smokeless tobacco: Never Used  . Alcohol use No    Review of Systems Constitutional: No fever/chills Eyes: No visual changes. ENT: No sore throat. Cardiovascular: Denies chest pain. Respiratory: Denies shortness of breath. Gastrointestinal: No abdominal pain.  No nausea, no vomiting.  No diarrhea.  No constipation. Genitourinary: Negative for dysuria. Musculoskeletal: left neck and shoulder pain Skin: Negative for rash. Neurological: left lower face and tongue numbness  10-point ROS otherwise negative.  ____________________________________________   PHYSICAL EXAM:  VITAL SIGNS: ED Triage Vitals  Enc Vitals Group     BP 08/06/16 2040 135/83     Pulse Rate 08/06/16 2040 77     Resp 08/06/16 2040 18     Temp 08/06/16 2040 99.1 F (37.3 C)     Temp Source 08/06/16 2040 Oral     SpO2 08/06/16 2040 99 %  Weight 08/06/16 2044 210 lb (95.3 kg)     Height 08/06/16 2044 5\' 4"  (1.626 m)     Head Circumference --      Peak Flow --      Pain Score 08/06/16 2044 8     Pain Loc --      Pain Edu? --      Excl. in GC? --     Constitutional: Alert and oriented. Well appearing and in mild distress. Eyes: Conjunctivae are normal. PERRL. EOMI. Head: Atraumatic. Nose: No congestion/rhinnorhea. Mouth/Throat: Mucous membranes are moist.  Oropharynx non-erythematous. Cardiovascular: Normal rate, regular rhythm. Grossly normal heart sounds.  Good peripheral circulation. Respiratory:  Normal respiratory effort.  No retractions. Lungs CTAB. Gastrointestinal: Soft and nontender. No distention. Positive bowels sounds Musculoskeletal: No lower extremity tenderness nor edema.   Neurologic:  Normal speech and language. Cranial nerves II through XII grossly intact with no focal motor or neuro deficits. Patient has some mild numbness to her left lower face. Skin:  Skin is warm, dry and intact.  Psychiatric: Mood and affect are normal.   ____________________________________________   LABS (all labs ordered are listed, but only abnormal results are displayed)  Labs Reviewed  BASIC METABOLIC PANEL - Abnormal; Notable for the following:       Result Value   Glucose, Bld 103 (*)    All other components within normal limits  CBC  TROPONIN I  TROPONIN I   ____________________________________________  EKG  ED ECG REPORT I, Rebecka ApleyWebster,  Allison P, the attending physician, personally viewed and interpreted this ECG.   Date: 08/06/2016  EKG Time: 2034  Rate: 78  Rhythm: normal sinus rhythm  Axis: normal  Intervals:none  ST&T Change: none  ____________________________________________  RADIOLOGY  CT angio head CT angio and neck ____________________________________________   PROCEDURES  Procedure(s) performed: None  Procedures  Critical Care performed: No  ____________________________________________   INITIAL IMPRESSION / ASSESSMENT AND PLAN / ED COURSE  Pertinent labs & imaging results that were available during my care of the patient were reviewed by me and considered in my medical decision making (see chart for details).  This is a 50 year old female who comes into the hospital today with some paresthesias to the face as well as some pain to her head and neck. The patient reports that the paresthesias have improved and the patient's blood work is unremarkable. I will send the patient for an angiogram of her head and neck to evaluate for possible dissection  or thrombosis causing the symptoms.  Clinical Course as of Aug 07 321  Tue Aug 07, 2016  0233 1. No evidence of dissection, hemodynamically significant stenosis, or occlusion of the carotid and vertebral arteries of the neck. 2. No significant stenosis, proximal occlusion, aneurysm, or vascular malformation of the circle of Willis. 3. Mild cervical spondylosis at the C5 through C7 levels, no high-grade bony canal stenosis or foraminal narrowing. 4. Status post thyroidectomy.   CT Angio Head W or Wo Contrast [AW]    Clinical Course User Index [AW] Rebecka ApleyAllison P Webster, MD   The patient's CT scan is unremarkable. She reports again that the symptoms are improved but not fully gone. I did offer the patient to stay for an MRI for further evaluation but she reports that she has an appointment with her doctor on Thursday and she feels comfortable following up with her doctor. The patient will be discharged home to follow-up with her primary care physician. She has no further complaints or concerns  at this time.  ____________________________________________   FINAL CLINICAL IMPRESSION(S) / ED DIAGNOSES  Final diagnoses:  Neck pain  Paresthesia      NEW MEDICATIONS STARTED DURING THIS VISIT:  New Prescriptions   No medications on file     Note:  This document was prepared using Dragon voice recognition software and may include unintentional dictation errors.    Rebecka ApleyAllison P Webster, MD 08/07/16 [phone]

## 2016-08-07 NOTE — Discharge Instructions (Signed)
Please follow-up with your primary care physician as you have scheduled. Please return to the emergency department with any concerns or worsening symptoms.

## 2016-08-09 ENCOUNTER — Encounter: Payer: Self-pay | Admitting: *Deleted

## 2016-08-09 ENCOUNTER — Emergency Department
Admission: EM | Admit: 2016-08-09 | Discharge: 2016-08-09 | Disposition: A | Discharge status: Home / Self Care | Payer: Self-pay | Attending: Emergency Medicine | Admitting: Emergency Medicine

## 2016-08-09 DIAGNOSIS — R079 Chest pain, unspecified: Secondary | ICD-10-CM

## 2016-08-09 DIAGNOSIS — E039 Hypothyroidism, unspecified: Secondary | ICD-10-CM | POA: Insufficient documentation

## 2016-08-09 DIAGNOSIS — Z79899 Other long term (current) drug therapy: Secondary | ICD-10-CM | POA: Insufficient documentation

## 2016-08-09 LAB — CBC
HCT: 34.3 % — ABNORMAL LOW (ref 35.0–47.0)
Hemoglobin: 12.2 g/dL (ref 12.0–16.0)
MCH: 30.8 pg (ref 26.0–34.0)
MCHC: 35.5 g/dL (ref 32.0–36.0)
MCV: 86.8 fL (ref 80.0–100.0)
Platelets: 301 10*3/uL (ref 150–440)
RBC: 3.95 MIL/uL (ref 3.80–5.20)
RDW: 12.9 % (ref 11.5–14.5)
WBC: 10 10*3/uL (ref 3.6–11.0)

## 2016-08-09 LAB — BASIC METABOLIC PANEL
Anion gap: 9 (ref 5–15)
BUN: 10 mg/dL (ref 6–20)
CO2: 22 mmol/L (ref 22–32)
Calcium: 9.3 mg/dL (ref 8.9–10.3)
Chloride: 106 mmol/L (ref 101–111)
Creatinine, Ser: 0.87 mg/dL (ref 0.44–1.00)
GFR calc Af Amer: >60 mL/min (ref >60)
GFR calc non Af Amer: >60 mL/min (ref >60)
Glucose, Bld: 126 mg/dL — ABNORMAL HIGH (ref 65–99)
Potassium: 3.1 mmol/L — ABNORMAL LOW (ref 3.5–5.1)
Sodium: 137 mmol/L (ref 135–145)

## 2016-08-09 LAB — TROPONIN I: Troponin I: <0.03 ng/mL (ref <0.03)

## 2016-08-09 MED ORDER — ASPIRIN 81 MG PO CHEW
324.0000 mg | CHEWABLE_TABLET | Freq: Once | ORAL | Status: AC
  Administered 2016-08-09: 324 mg via ORAL
  Filled 2016-08-09: qty 4

## 2016-08-09 MED ORDER — NITROGLYCERIN 0.4 MG SL SUBL
0.4000 mg | SUBLINGUAL_TABLET | Freq: Once | SUBLINGUAL | Status: AC
  Administered 2016-08-09: 0.4 mg via SUBLINGUAL
  Filled 2016-08-09: qty 1

## 2016-08-09 NOTE — ED Triage Notes (Signed)
Pt to triage via wheelchair.  Pt reports chest pain in center of chest for 1 hour.  Pt states pain radiates into back.  Pt has nausea.  Pt alert.

## 2016-08-09 NOTE — ED Provider Notes (Signed)
[facility] Emergency Department Provider Note    First MD Initiated Contact with Patient 08/09/16 0231     (approximate)  I have reviewed the triage vital signs and the nursing notes.   HISTORY  Chief Complaint Chest Pain   HPI Morgan Stevens is a 50 y.o. female with bolus of chronic medical conditions presents to the emergency department with. Patient states her current pain score is 8 out of 10. Patient denies any palpitations no dizziness. Patient denies any dyspnea. Patient denies any lower extremity pain or swelling.   Past Medical History:  Diagnosis Date  . Obesity (BMI 30-39.9)   . Reflux   . Thyroid disease     Patient Active Problem List   Diagnosis Date Noted  . Pain in the chest   . Abnormal cardiac function test 02/26/2016  . Obesity (BMI 30-39.9)   . Angina pectoris (HCC) 02/25/2016  . Dyspnea 02/25/2016  . Hypothyroidism 02/25/2016  . Chest pain 09/07/2015    Past Surgical History:  Procedure Laterality Date  . CARDIAC CATHETERIZATION  02/27/2016   Procedure: Left Heart Cath and Coronary Angiography;  Surgeon: Corky CraftsJayadeep S Varanasi, MD;  [facility];  Service: Cardiovascular;;  . CESAREAN SECTION    . CHOLECYSTECTOMY    . THYROIDECTOMY      Prior to Admission medications   Medication Sig Start Date End Date Taking? Authorizing Provider  levothyroxine (SYNTHROID, LEVOTHROID) 137 MCG tablet Take 137 mcg by mouth daily before breakfast.    Historical Provider, MD  ranitidine (ZANTAC) 150 MG tablet Take 1 tablet (150 mg total) by mouth 2 (two) times daily. 07/28/16 07/28/17  Willy EddyPatrick Robinson, MD    Allergies Patient has no known allergies.  No family history on file.  Social History Social History  Substance Use Topics  . Smoking status: Never Smoker  . Smokeless tobacco: Never Used  . Alcohol use No    Review of Systems Constitutional: No fever/chills Eyes: No visual changes. ENT: No sore  throat. Cardiovascular: Positive for chest pain. Respiratory: Denies shortness of breath. Gastrointestinal: No abdominal pain.  No nausea, no vomiting.  No diarrhea.  No constipation. Genitourinary: Negative for dysuria. Musculoskeletal: Negative for back pain. Skin: Negative for rash. Neurological: Negative for headaches, focal weakness or numbness.  10-point ROS otherwise negative.  ____________________________________________   PHYSICAL EXAM:  VITAL SIGNS: ED Triage Vitals  Enc Vitals Group     BP 08/09/16 0043 (!) 151/95     Pulse Rate 08/09/16 0043 99     Resp 08/09/16 0043 20     Temp 08/09/16 0043 98.1 F (36.7 C)     Temp Source 08/09/16 0043 Oral     SpO2 08/09/16 0043 100 %     Weight 08/09/16 0040 210 lb (95.3 kg)     Height 08/09/16 0040 5\' 4"  (1.626 m)     Head Circumference --      Peak Flow --      Pain Score 08/09/16 0040 10     Pain Loc --      Pain Edu? --      Excl. in GC? --     Constitutional: Alert and oriented. Well appearing and in no acute distress. Eyes: Conjunctivae are normal. PERRL. EOMI. Head: Atraumatic. Mouth/Throat: Mucous membranes are moist.  Oropharynx non-erythematous. Neck: No stridor.   Cardiovascular: Normal rate, regular rhythm. Good peripheral circulation. Grossly normal heart sounds. Respiratory: Normal respiratory effort.  No retractions. Lungs CTAB. Gastrointestinal: Soft  and nontender. No distention.  Musculoskeletal: No lower extremity tenderness nor edema. No gross deformities of extremities. Neurologic:  Normal speech and language. No gross focal neurologic deficits are appreciated.  Skin:  Skin is warm, dry and intact. No rash noted. Psychiatric: Mood and affect are normal. Speech and behavior are normal.  ____________________________________________   LABS (all labs ordered are listed, but only abnormal results are displayed)  Labs Reviewed  BASIC METABOLIC PANEL - Abnormal; Notable for the following:        Result Value   Potassium 3.1 (*)    Glucose, Bld 126 (*)    All other components within normal limits  CBC - Abnormal; Notable for the following:    HCT 34.3 (*)    All other components within normal limits  TROPONIN I   ____________________________________________  EKG  ED ECG REPORT I, [place] N Patrisia Faeth, the attending physician, personally viewed and interpreted this ECG.   Date: 08/09/2016  EKG Time: 12:43 AM  Rate: 92  Rhythm: Normal sinus rhythm  Axis: Normal  Intervals: Normal  ST&T Change: None       Procedures     INITIAL IMPRESSION / ASSESSMENT AND PLAN / ED COURSE  Pertinent labs & imaging results that were available during my care of the patient were reviewed by me and considered in my medical decision making (see chart for details).  50 year old female presents to emergency department with central chest pain. Review the patient's chart revealed multiple visits to the ED with similar complaint. Cardiac catheterization performed in July revealed no evidence of coronary artery disease CT angiogram performed twice this year revealed no evidence of pulmonary emboli. In a chain the possibility that this may be secondary to coronary vasospasms. Patient given one sublingual nitroglycerin with complete resolution of chest pain. Patient be referred to primary care provider for further outpatient evaluation   Clinical Course     ____________________________________________  FINAL CLINICAL IMPRESSION(S) / ED DIAGNOSES  Final diagnoses:  Chest pain, unspecified type     MEDICATIONS GIVEN DURING THIS VISIT:  Medications  nitroGLYCERIN (NITROSTAT) SL tablet 0.4 mg (0.4 mg Sublingual Given 08/09/16 0252)  aspirin chewable tablet 324 mg (324 mg Oral Given 08/09/16 0251)     NEW OUTPATIENT MEDICATIONS STARTED DURING THIS VISIT:  New Prescriptions   No medications on file    Modified Medications   No medications on file    Discontinued Medications   No  medications on file     Note:  This document was prepared using Dragon voice recognition software and may include unintentional dictation errors.    Darci Currentandolph N Chantz Montefusco, MD 08/09/16 [phone]

## 2016-08-24 ENCOUNTER — Emergency Department
Admission: EM | Admit: 2016-08-24 | Discharge: 2016-08-24 | Discharge status: Against Medical Advice | Payer: Self-pay | Attending: Emergency Medicine | Admitting: Emergency Medicine

## 2016-08-24 ENCOUNTER — Encounter: Payer: Self-pay | Admitting: *Deleted

## 2016-08-24 ENCOUNTER — Emergency Department: Payer: Self-pay

## 2016-08-24 DIAGNOSIS — E039 Hypothyroidism, unspecified: Secondary | ICD-10-CM | POA: Insufficient documentation

## 2016-08-24 DIAGNOSIS — R079 Chest pain, unspecified: Secondary | ICD-10-CM | POA: Insufficient documentation

## 2016-08-24 HISTORY — DX: Diaphragmatic hernia without obstruction or gangrene: K44.9

## 2016-08-24 LAB — CBC
HCT: 32.9 % — ABNORMAL LOW (ref 35.0–47.0)
Hemoglobin: 11.8 g/dL — ABNORMAL LOW (ref 12.0–16.0)
MCH: 30.7 pg (ref 26.0–34.0)
MCHC: 36 g/dL (ref 32.0–36.0)
MCV: 85.3 fL (ref 80.0–100.0)
Platelets: 326 10*3/uL (ref 150–440)
RBC: 3.86 MIL/uL (ref 3.80–5.20)
RDW: 12.8 % (ref 11.5–14.5)
WBC: 7.3 10*3/uL (ref 3.6–11.0)

## 2016-08-24 LAB — BASIC METABOLIC PANEL
Anion gap: 6 (ref 5–15)
BUN: 10 mg/dL (ref 6–20)
CO2: 24 mmol/L (ref 22–32)
Calcium: 9.1 mg/dL (ref 8.9–10.3)
Chloride: 108 mmol/L (ref 101–111)
Creatinine, Ser: 0.77 mg/dL (ref 0.44–1.00)
GFR calc Af Amer: >60 mL/min (ref >60)
GFR calc non Af Amer: >60 mL/min (ref >60)
Glucose, Bld: 115 mg/dL — ABNORMAL HIGH (ref 65–99)
Potassium: 3.3 mmol/L — ABNORMAL LOW (ref 3.5–5.1)
Sodium: 138 mmol/L (ref 135–145)

## 2016-08-24 LAB — TROPONIN I: Troponin I: <0.03 ng/mL (ref <0.03)

## 2016-08-24 IMAGING — CR DG CHEST 2V
1 series · 2 of 2 positions shown · non-contrast
Comparison: Radiographs [DATE], most recent chest CT [DATE]

CLINICAL DATA: Central chest pain.

EXAM:
CHEST  2 VIEW

[Series 1: dg chest 2 view · 0.14mm/px · 2 of 2 slices shown]
[im 1/2]
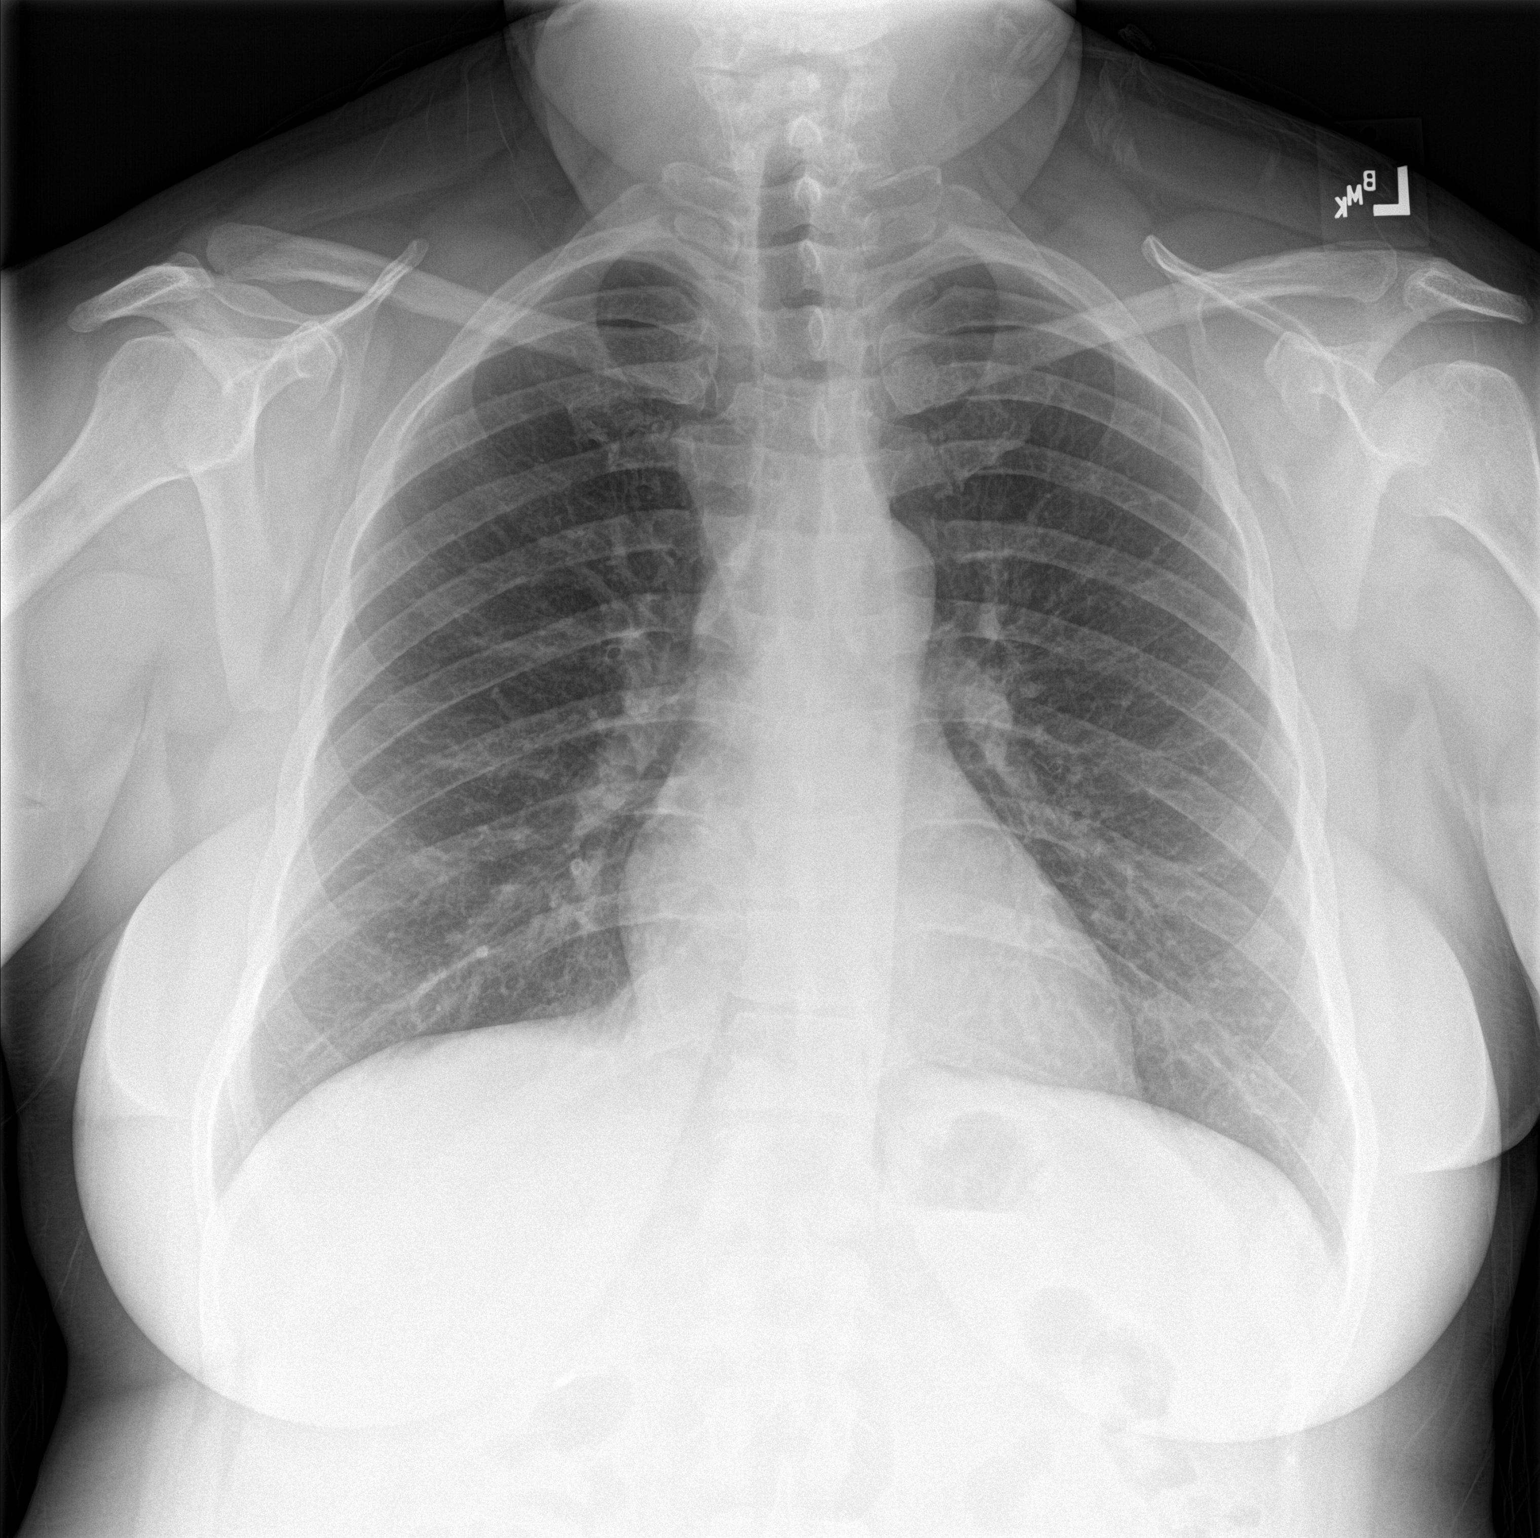
[im 2/2]
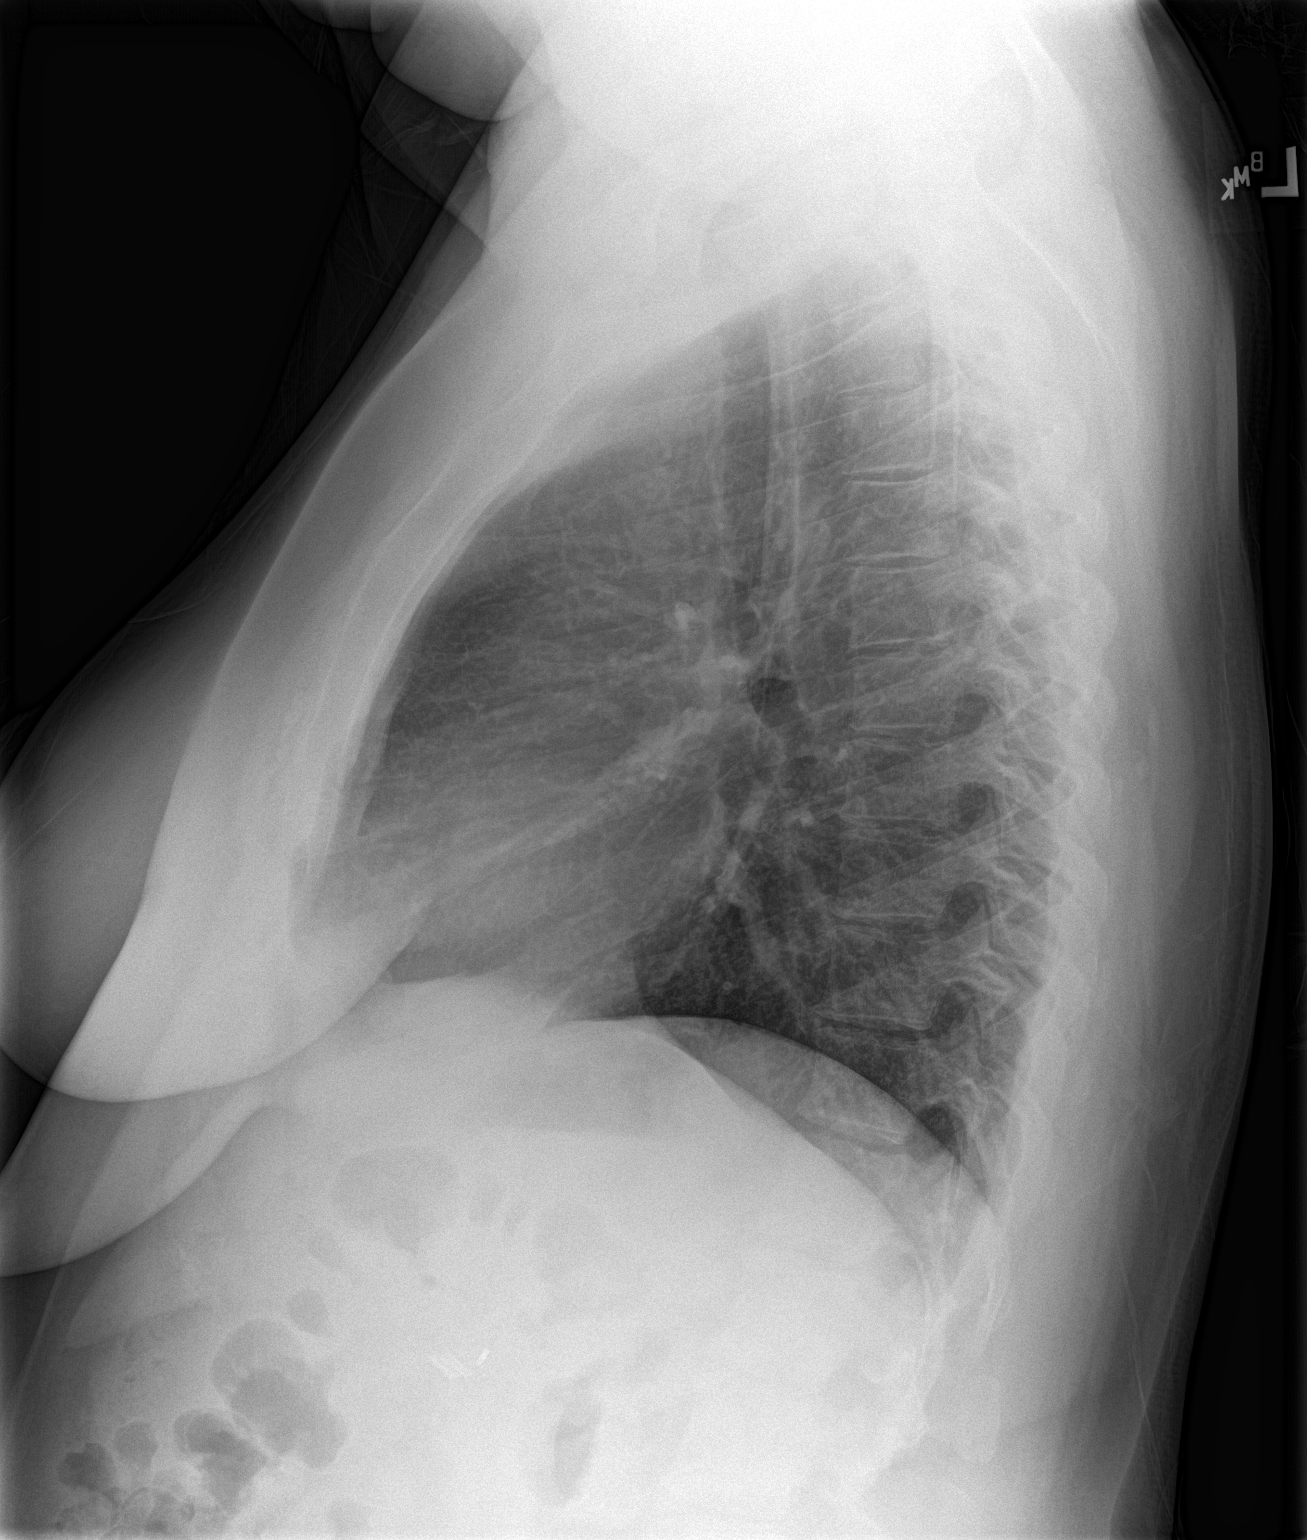

[2 of 2 positions shown; findings below may reference images not displayed]

FINDINGS: The cardiomediastinal contours are normal. The lungs are clear.
Pulmonary vasculature is normal. No consolidation, pleural effusion,
or pneumothorax. No acute osseous abnormalities are seen.
IMPRESSION: No acute abnormality or change from prior exams.

## 2016-08-24 NOTE — ED Triage Notes (Signed)
Pt c/o central chest pain that radiates to her back, characterizes it as squeezing pain. Pt has hx of GERD and is taking meds for this as rx'd per report. Pt denies eating prior to onset of pain. Pt's pain is reproducible w/ palpation and ambulation.

## 2016-08-24 NOTE — ED Notes (Signed)
Dr Mayford KnifeWilliams was evaluating pt, pt walked out of room AMA

## 2016-08-24 NOTE — ED Provider Notes (Signed)
[facility] Emergency Department Provider Note        Time seen: ----------------------------------------- 10:33 PM on 08/24/2016 -----------------------------------------    I have reviewed the triage vital signs and the nursing notes.   HISTORY  Chief Complaint Chest Pain    HPI Huel CoventryRuth Hoecker is a 50 y.o. female who presents to ER for central chest pain that radiates into her back. She characterizes it as a squeezing pain. She does have a history of GERD and is taking medications. She denies eating prior to the onset of pain. Patient states the pain is reproducible with palpationand ambulation. Patient has been seen for this before this year and has had negative cardiac catheter and nuclear stress test. She also has been referred to gastroenterology.   Past Medical History:  Diagnosis Date  . Hiatal hernia   . Obesity (BMI 30-39.9)   . Reflux   . Thyroid disease     Patient Active Problem List   Diagnosis Date Noted  . Pain in the chest   . Abnormal cardiac function test 02/26/2016  . Obesity (BMI 30-39.9)   . Angina pectoris (HCC) 02/25/2016  . Dyspnea 02/25/2016  . Hypothyroidism 02/25/2016  . Chest pain 09/07/2015    Past Surgical History:  Procedure Laterality Date  . CARDIAC CATHETERIZATION  02/27/2016   Procedure: Left Heart Cath and Coronary Angiography;  Surgeon: Corky CraftsJayadeep S Varanasi, MD;  [facility];  Service: Cardiovascular;;  . CESAREAN SECTION    . CHOLECYSTECTOMY    . THYROIDECTOMY    . THYROIDECTOMY      Allergies Patient has no known allergies.  Social History Social History  Substance Use Topics  . Smoking status: Never Smoker  . Smokeless tobacco: Never Used  . Alcohol use No    Review of Systems Constitutional: Negative for fever. Cardiovascular: Positive for chest pain Respiratory: Negative for shortness of breath. Gastrointestinal: Negative for abdominal pain, vomiting and  diarrhea. Genitourinary: Negative for dysuria. Musculoskeletal: Positive for back pain Skin: Negative for rash. Neurological: Negative for headaches, focal weakness or numbness.  10-point ROS otherwise negative.  ____________________________________________   PHYSICAL EXAM:  VITAL SIGNS: ED Triage Vitals  Enc Vitals Group     BP 08/24/16 2007 (!) 148/70     Pulse Rate 08/24/16 2007 74     Resp 08/24/16 2007 (!) 24     Temp 08/24/16 2007 98.2 F (36.8 C)     Temp Source 08/24/16 2007 Oral     SpO2 08/24/16 2007 100 %     Weight 08/24/16 2010 210 lb (95.3 kg)     Height 08/24/16 2010 5\' 4"  (1.626 m)     Head Circumference --      Peak Flow --      Pain Score 08/24/16 2010 8     Pain Loc --      Pain Edu? --      Excl. in GC? --     Constitutional: Alert and oriented. Well appearing and in no distress. Eyes: Conjunctivae are normal. PERRL. Normal extraocular movements. ENT   Head: Normocephalic and atraumatic.   Nose: No congestion/rhinnorhea.   Mouth/Throat: Mucous membranes are moist.   Neck: No stridor. Cardiovascular: Normal rate, regular rhythm. No murmurs, rubs, or gallops. Respiratory: Normal respiratory effort without tachypnea nor retractions. Breath sounds are clear and equal bilaterally. No wheezes/rales/rhonchi. Gastrointestinal: Soft and nontender. Normal bowel sounds Musculoskeletal: Nontender with normal range of motion in all extremities. No lower extremity tenderness nor  edema. Neurologic:  Normal speech and language. No gross focal neurologic deficits are appreciated.  Skin:  Skin is warm, dry and intact. No rash noted. Psychiatric: Mood and affect are normal. Speech and behavior are normal.  ____________________________________________  EKG: Interpreted by me.Sinus rhythm rate of 66 bpm, normal. We'll, normal QRS, normal QT, normal axis.  ____________________________________________  ED COURSE:  Pertinent labs & imaging results that  were available during my care of the patient were reviewed by me and considered in my medical decision making (see chart for details). Clinical Course   Patient presents to ER for chest pain of uncertain etiology. We will assess with labs and imaging.  Procedures ____________________________________________   LABS (pertinent positives/negatives)  Labs Reviewed  BASIC METABOLIC PANEL - Abnormal; Notable for the following:       Result Value   Potassium 3.3 (*)    Glucose, Bld 115 (*)    All other components within normal limits  CBC - Abnormal; Notable for the following:    Hemoglobin 11.8 (*)    HCT 32.9 (*)    All other components within normal limits  TROPONIN I    RADIOLOGY Chest x-ray is normal  ____________________________________________  FINAL ASSESSMENT AND PLAN  Chest pain  Plan: Patient with labs and imaging as dictated above. Patient presents with acute on chronic chest pain of uncertain etiology. Previous heart catheter this year was negative. Giardia is taking Zantac and Prilosec. I have advised I do not know the source of this pain but I suspect some anxiety component. She became angry at this suggestion and abruptly left AMA. I could not convince her to stay for further evaluation.   Emily FilbertWilliams, Jonathan E, MD   Note: This dictation was prepared with Dragon dictation. Any transcriptional errors that result from this process are unintentional    Emily FilbertJonathan E Williams, MD 08/24/16 2236

## 2016-08-24 NOTE — ED Notes (Signed)
Pt reports she has chest pain for about 3 hours reports was walking at a store pushing a shopping cart when pain started, pt describes as tight reports comes and goes, reports some shortness of breath. Pt able to walk from wheelchair to bed, no distress noted. Pt reports felt dizzy and felt like "I was gonna get sick" denis any episodes of emesis

## 2016-08-27 HISTORY — PX: THYROIDECTOMY: SHX17

## 2016-09-24 ENCOUNTER — Encounter: Payer: Self-pay | Admitting: *Deleted

## 2016-09-24 ENCOUNTER — Ambulatory Visit: Payer: Self-pay | Attending: Oncology | Admitting: *Deleted

## 2016-09-24 ENCOUNTER — Ambulatory Visit
Admission: RE | Admit: 2016-09-24 | Discharge: 2016-09-24 | Disposition: A | Discharge status: Home / Self Care | Payer: Self-pay | Source: Ambulatory Visit | Attending: Oncology | Admitting: Oncology

## 2016-09-24 VITALS — BP 123/82 | HR 65 | Temp 97.9°F | Ht 66.14 in | Wt 213.1 lb

## 2016-09-24 DIAGNOSIS — Z1231 Encounter for screening mammogram for malignant neoplasm of breast: Secondary | ICD-10-CM | POA: Insufficient documentation

## 2016-09-24 DIAGNOSIS — Z0000 Encounter for general adult medical examination without abnormal findings: Secondary | ICD-10-CM

## 2016-09-24 IMAGING — MG MM DIGITAL SCREENING BILAT W/ TOMO W/ CAD
8 of 12 series · 8 of 28 positions shown · non-contrast
Comparison: None.

ACR Breast Density Category a: The breast tissue is almost entirely
fatty.

CLINICAL DATA: Screening.

EXAM:
2D DIGITAL SCREENING BILATERAL MAMMOGRAM WITH CAD AND ADJUNCT TOMO

[L CC]
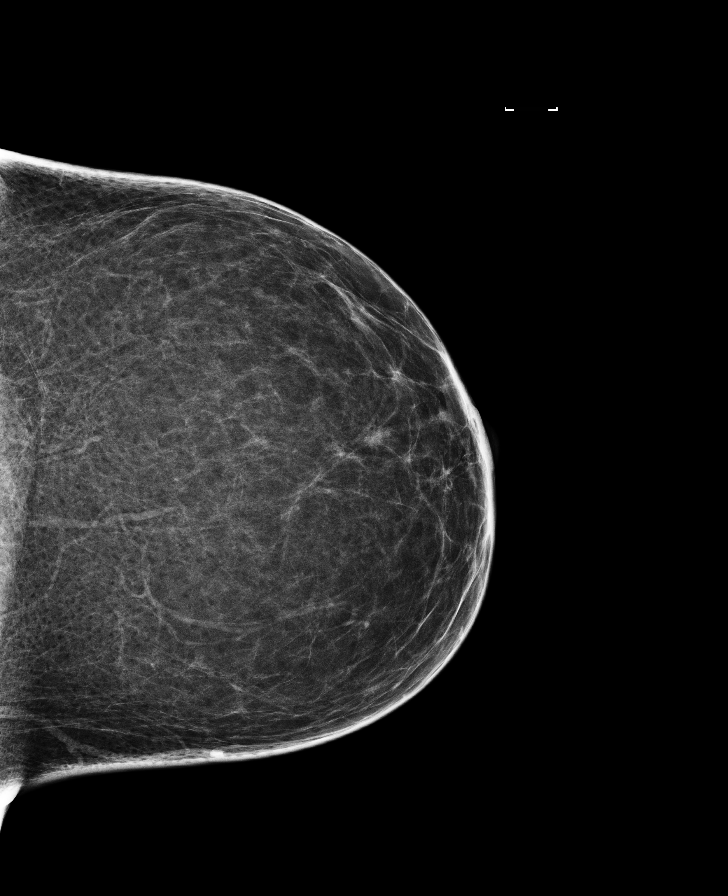

[L CC synth-2D]
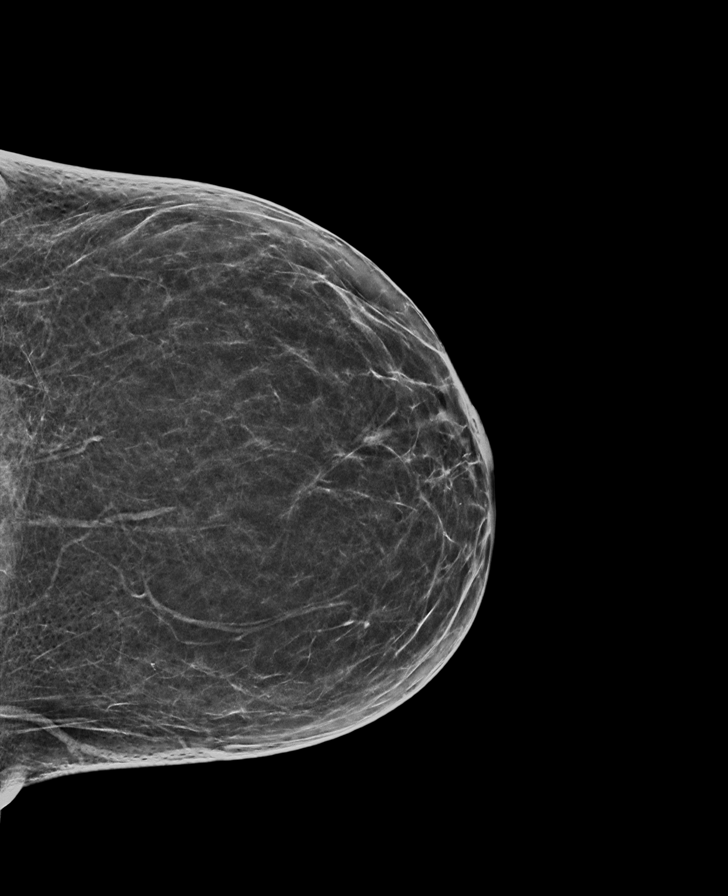

[R MLO]
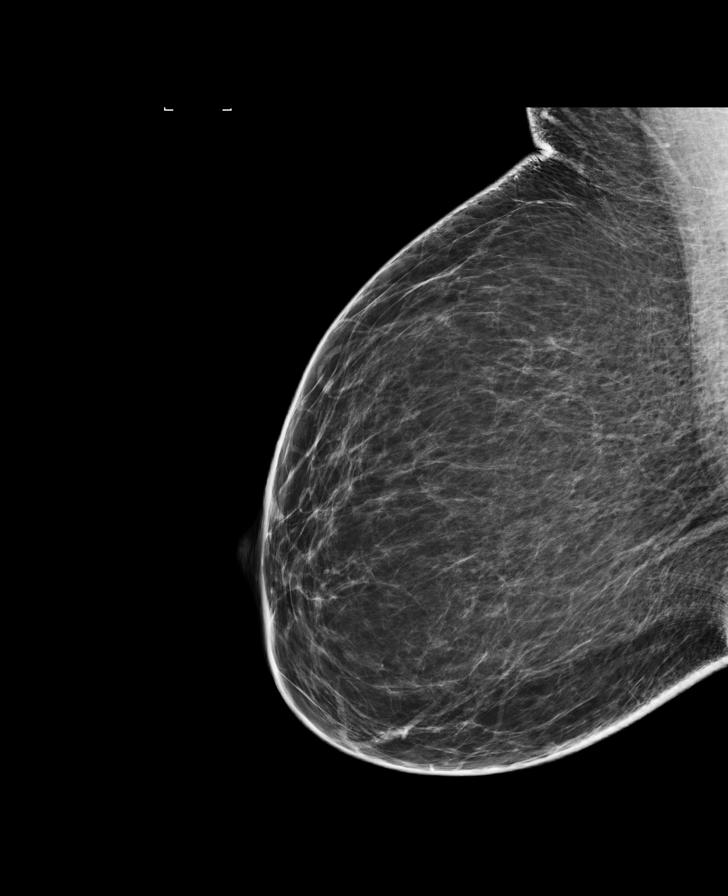

[R CC]
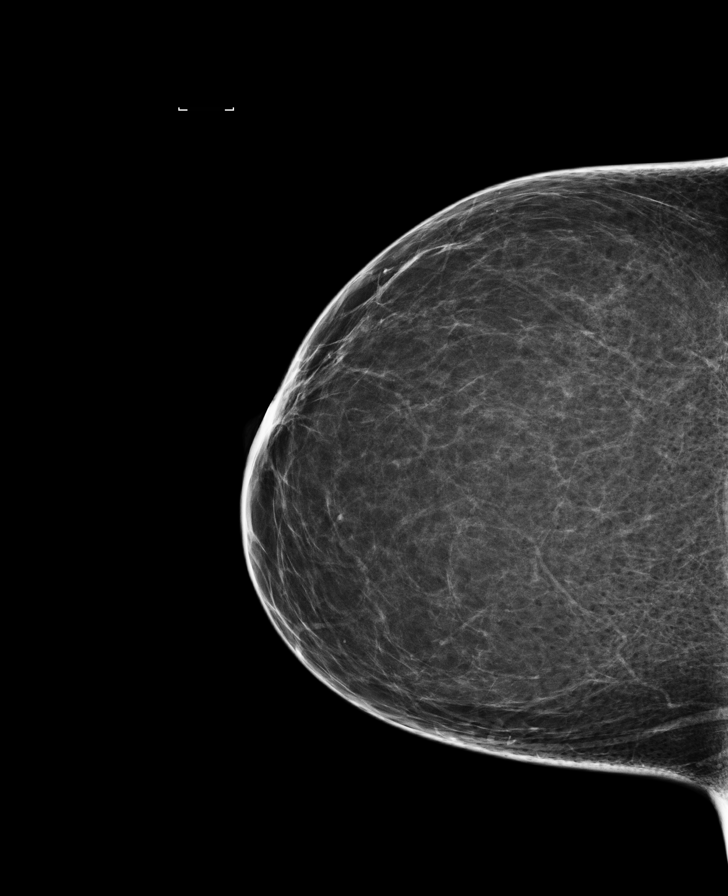

[R MLO synth-2D]
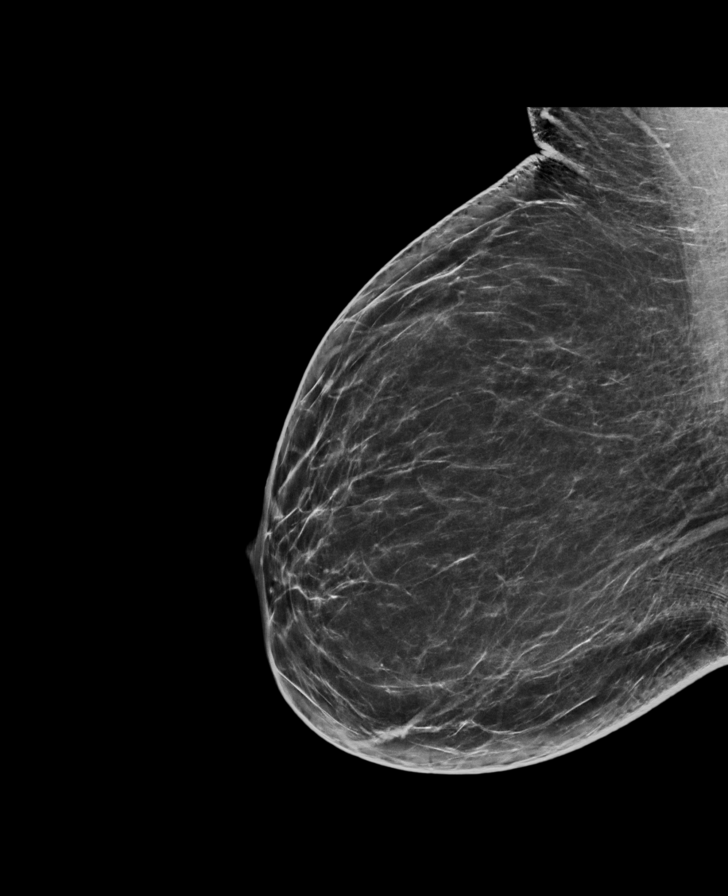

[L MLO]
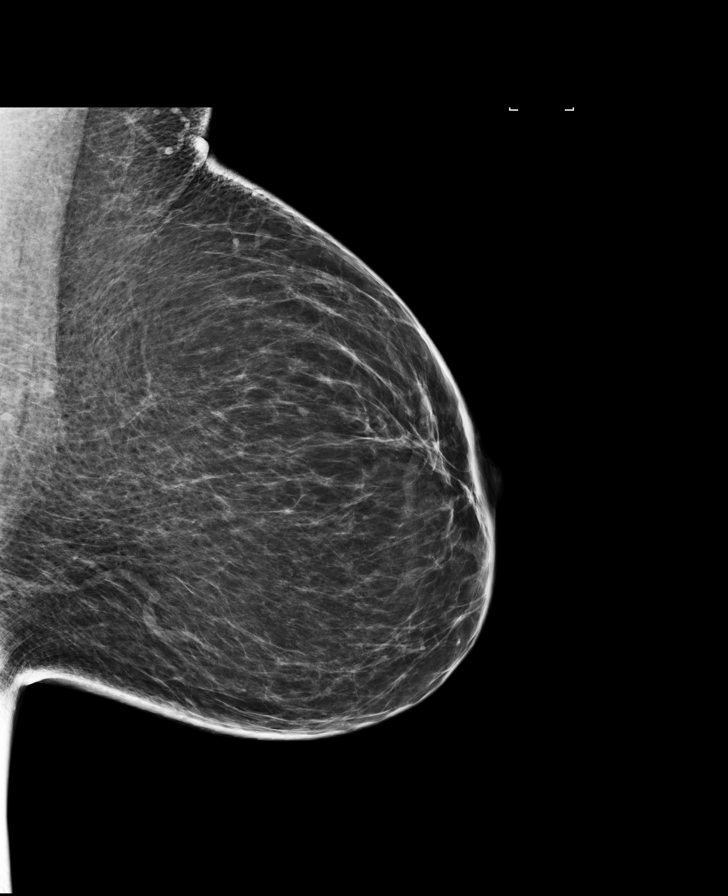

[L MLO synth-2D]
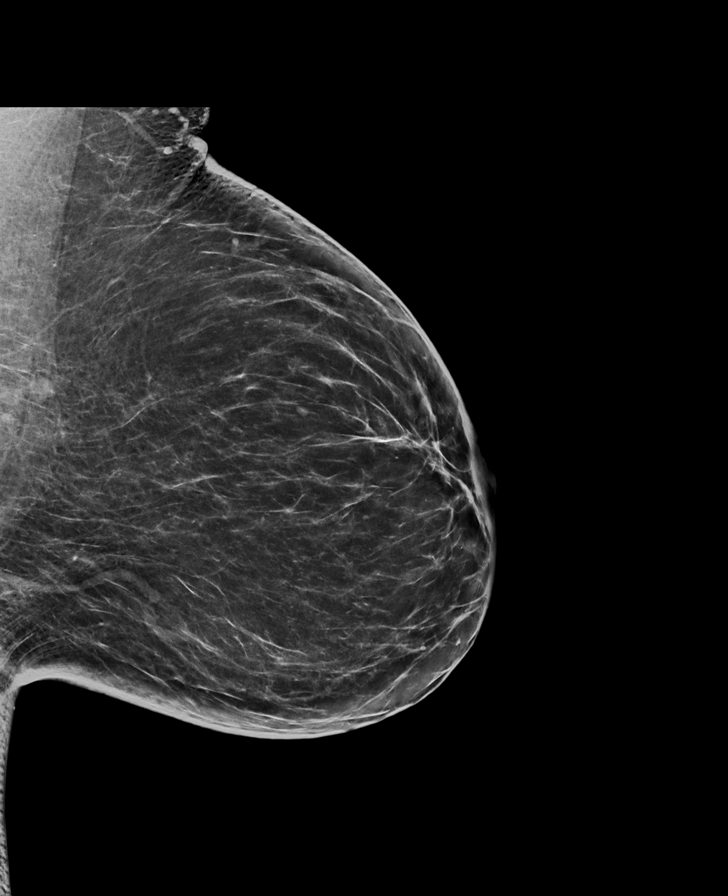

[R CC synth-2D]
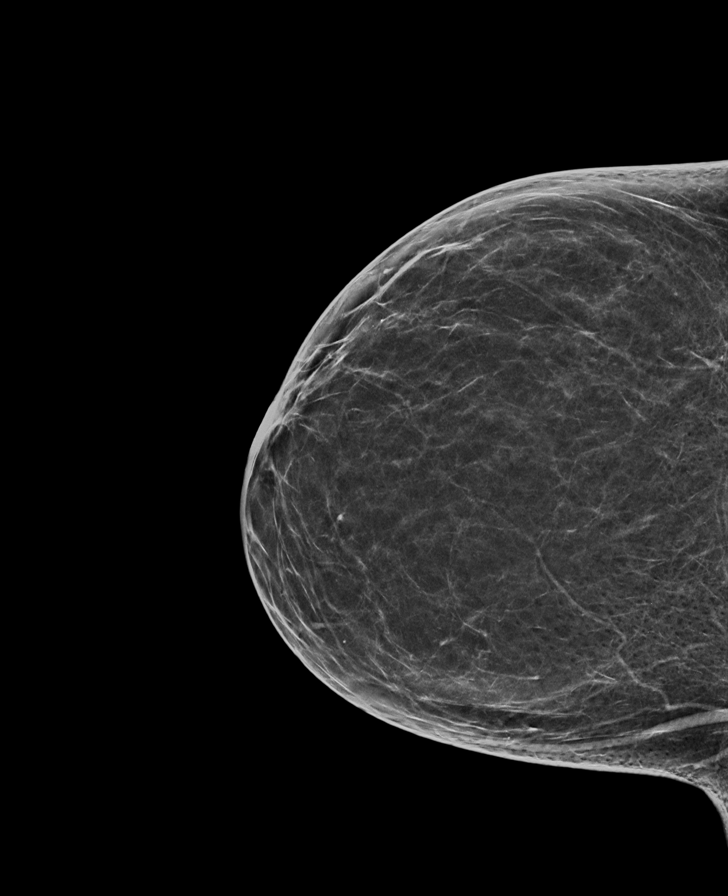

[8 of 28 positions shown; findings below may reference images not displayed]

FINDINGS: There are no findings suspicious for malignancy. Images were
processed with CAD.
IMPRESSION: No mammographic evidence of malignancy. A result letter of this
screening mammogram will be mailed directly to the patient.

RECOMMENDATION:
Screening mammogram in one year. (Code:[0M])

BI-RADS CATEGORY  1: Negative.

## 2016-09-24 NOTE — Progress Notes (Signed)
Subjective:     Patient ID: Huel CoventryRuth Palleschi, female   DOB: [birth year], 51 y.o.   MRN: [id number]  HPI   Review of Systems     Objective:   Physical Exam  Pulmonary/Chest: Right breast exhibits no inverted nipple, no mass, no nipple discharge, no skin change and no tenderness. Left breast exhibits no inverted nipple, no mass, no nipple discharge, no skin change and no tenderness. Breasts are symmetrical.       Assessment:     51 [facility] Black [facility].  Clinical breast exam unremarkable.  Taught self breast awareness.  Patient has been screened for eligibility.  She does not have any insurance, Medicare or Medicaid.  She also meets financial eligibility.  Hand-out given on the Affordable Care Act.    Plan:     Screening mammogram ordered.  Will follow-up per [facility] protocol.

## 2016-09-24 NOTE — Patient Instructions (Signed)
Gave patient hand-out, Women Staying Healthy, Active and Well from [facility], with education on breast health, pap smears, heart and colon health. 

## 2016-09-25 ENCOUNTER — Encounter: Payer: Self-pay | Admitting: *Deleted

## 2016-09-25 NOTE — Progress Notes (Signed)
Letter mailed from the [facility] to inform patient of her normal mammogram results.  Patient is to follow-up with annual screening in one year.  HSIS to Christy. 

## 2016-10-24 ENCOUNTER — Encounter: Payer: Self-pay | Admitting: Emergency Medicine

## 2016-10-24 ENCOUNTER — Emergency Department
Admission: EM | Admit: 2016-10-24 | Discharge: 2016-10-24 | Disposition: A | Discharge status: Home / Self Care | Payer: Self-pay | Attending: Emergency Medicine | Admitting: Emergency Medicine

## 2016-10-24 DIAGNOSIS — Y929 Unspecified place or not applicable: Secondary | ICD-10-CM | POA: Insufficient documentation

## 2016-10-24 DIAGNOSIS — S0300XA Dislocation of jaw, unspecified side, initial encounter: Secondary | ICD-10-CM

## 2016-10-24 DIAGNOSIS — S0303XA Dislocation of jaw, bilateral, initial encounter: Secondary | ICD-10-CM | POA: Insufficient documentation

## 2016-10-24 DIAGNOSIS — Y939 Activity, unspecified: Secondary | ICD-10-CM | POA: Insufficient documentation

## 2016-10-24 DIAGNOSIS — Y999 Unspecified external cause status: Secondary | ICD-10-CM | POA: Insufficient documentation

## 2016-10-24 DIAGNOSIS — X58XXXA Exposure to other specified factors, initial encounter: Secondary | ICD-10-CM | POA: Insufficient documentation

## 2016-10-24 DIAGNOSIS — E039 Hypothyroidism, unspecified: Secondary | ICD-10-CM | POA: Insufficient documentation

## 2016-10-24 MED ORDER — KETOROLAC TROMETHAMINE 60 MG/2ML IM SOLN
30.0000 mg | Freq: Once | INTRAMUSCULAR | Status: AC
  Administered 2016-10-24: 30 mg via INTRAMUSCULAR
  Filled 2016-10-24: qty 2

## 2016-10-24 MED ORDER — NAPROXEN 500 MG PO TABS: 500.0000 mg | ORAL_TABLET | Freq: Two times a day (BID) | ORAL | 0 refills | Status: DC

## 2016-10-24 NOTE — ED Provider Notes (Signed)
[facility]-EMERGENCY DEPARTMENT Provider Note   CSN: [id number] Arrival date & time: 10/24/16  1717     History   Chief Complaint Chief Complaint  Patient presents with  . Jaw Pain    HPI Morgan Stevens is a 51 y.o. female presents to the emergency department for evaluation of left greater than right jaw pain. She points to the left and right TMJ. She denies any trauma or injury. No numbness tingling or weakness. Pain is moderate and increased with opening and closing her jaw. She has not taken any medications for the pain. She does not grind her teeth at nighttime. She denies any headache, fevers, neck pain.  HPI  Past Medical History:  Diagnosis Date  . Hiatal hernia   . Obesity (BMI 30-39.9)   . Reflux   . Thyroid disease     Patient Active Problem List   Diagnosis Date Noted  . Pain in the chest   . Abnormal cardiac function test 02/26/2016  . Obesity (BMI 30-39.9)   . Angina pectoris (HCC) 02/25/2016  . Dyspnea 02/25/2016  . Hypothyroidism 02/25/2016  . Chest pain 09/07/2015    Past Surgical History:  Procedure Laterality Date  . CARDIAC CATHETERIZATION  02/27/2016   Procedure: Left Heart Cath and Coronary Angiography;  Surgeon: Corky CraftsJayadeep S Varanasi, MD;  [facility];  Service: Cardiovascular;;  . CESAREAN SECTION    . CHOLECYSTECTOMY    . THYROIDECTOMY    . THYROIDECTOMY      OB History    No data available       Home Medications    Prior to Admission medications   Medication Sig Start Date End Date Taking? Authorizing Provider  levothyroxine (SYNTHROID, LEVOTHROID) 137 MCG tablet Take 137 mcg by mouth daily before breakfast.    Historical Provider, MD  naproxen (NAPROSYN) 500 MG tablet Take 1 tablet (500 mg total) by mouth 2 (two) times daily with a meal. 10/24/16   Evon Slackhomas C Randy Castrejon, PA-C  omeprazole (PRILOSEC) 20 MG capsule Take 20 mg by mouth daily.    Historical Provider, MD  ranitidine (ZANTAC) 150 MG tablet Take 1 tablet (150 mg total)  by mouth 2 (two) times daily. 07/28/16 07/28/17  Willy EddyPatrick Robinson, MD    Family History Family History  Problem Relation Age of Onset  . Breast cancer Neg Hx     Social History Social History  Substance Use Topics  . Smoking status: Never Smoker  . Smokeless tobacco: Never Used  . Alcohol use No     Allergies   Patient has no known allergies.   Review of Systems Review of Systems  Constitutional: Negative for activity change, chills, fatigue and fever.  HENT: Negative for congestion, sinus pressure, sore throat and trouble swallowing.   Eyes: Negative for visual disturbance.  Respiratory: Negative for cough, chest tightness and shortness of breath.   Cardiovascular: Negative for chest pain and leg swelling.  Gastrointestinal: Negative for abdominal pain, diarrhea, nausea and vomiting.  Genitourinary: Negative for dysuria.  Musculoskeletal: Negative for arthralgias and gait problem.  Skin: Negative for rash.  Neurological: Negative for weakness, numbness and headaches.  Hematological: Negative for adenopathy.  Psychiatric/Behavioral: Negative for agitation, behavioral problems and confusion.     Physical Exam Updated Vital Signs BP 134/74 (BP Location: Left Arm)   Pulse 78   Temp 98.4 F (36.9 C) (Oral)   Resp 18   Ht 5\' 4"  (1.626 m)   Wt 94.3 kg   LMP 09/24/2016 (Approximate)  SpO2 98%   BMI 35.70 kg/m   Physical Exam  Constitutional: She appears well-developed and well-nourished. No distress.  HENT:  Head: Normocephalic and atraumatic.  Right Ear: External ear normal.  Left Ear: External ear normal.  Nose: Nose normal.  Mouth/Throat: Oropharynx is clear and moist. No oropharyngeal exudate.  Patient point tender along bilateral TMJs, left greater than right. She has pain with opening and closing. There is no sign of any dental abscesses, she is nontender intraorally. Sensation is intact throughout the facial nerves  Eyes: Conjunctivae are normal.  Neck:  Normal range of motion. Neck supple.  Cardiovascular: Normal rate and regular rhythm.   No murmur heard. Pulmonary/Chest: Effort normal and breath sounds normal. No respiratory distress.  Musculoskeletal: She exhibits no edema.  Lymphadenopathy:    She has no cervical adenopathy.  Neurological: She is alert.  Skin: Skin is warm and dry.  Psychiatric: She has a normal mood and affect.  Nursing note and vitals reviewed.    ED Treatments / Results  Labs (all labs ordered are listed, but only abnormal results are displayed) Labs Reviewed - No data to display  EKG  EKG Interpretation None       Radiology No results found.  Procedures Procedures (including critical care time)  Medications Ordered in ED Medications  ketorolac (TORADOL) injection 30 mg (30 mg Intramuscular Given 10/24/16 1841)     Initial Impression / Assessment and Plan / ED Course  I have reviewed the triage vital signs and the nursing notes.  Pertinent labs & imaging results that were available during my care of the patient were reviewed by me and considered in my medical decision making (see chart for details).     51 year old female with left greater than right TMJ that began earlier today. She was given Toradol 30 mg IM and 20 minutes after injection, she has received 75% relief of her pain. She will eat a soft diet, start naproxen. She'll follow-up with ENT if no improvement in 5-7 days. She is educated on signs and symptoms return to ED for.  Final Clinical Impressions(s) / ED Diagnoses   Final diagnoses:  TMJ (dislocation of temporomandibular joint), initial encounter    New Prescriptions New Prescriptions   NAPROXEN (NAPROSYN) 500 MG TABLET    Take 1 tablet (500 mg total) by mouth 2 (two) times daily with a meal.     Evon Slack, PA-C 10/24/16 1859    Governor Rooks, MD 10/24/16 [phone]

## 2016-10-24 NOTE — ED Triage Notes (Signed)
Pt comes into the ED via POV c/o bilateral jaw pain that started 30 minutes ago.  Denies the cause of onset, states she has bad dental problems in the past.  Denies any chest pain, shortness of breath, or pain that radiates into her back, neck or arms.  Patient in NAD at this time with even and unlabored respirations.

## 2016-10-24 NOTE — Discharge Instructions (Signed)
Please eat a soft diet. Take naproxen as prescribed. Follow-up with ear nose and throat physician if no improvement in 5-7 days. Return to the ER for any worsening symptoms or urgent changes in her health.

## 2016-10-24 NOTE — ED Notes (Signed)
See triage note. States she developed pain to jaw area about 30 mins PTA  States pain was sudden onset but does not radiate.Marland Kitchen. Hx of "bad" teeth  Not sure if pain is coming from teeth

## 2016-10-29 ENCOUNTER — Emergency Department
Admission: EM | Admit: 2016-10-29 | Discharge: 2016-10-29 | Disposition: A | Discharge status: Home / Self Care | Payer: Self-pay | Attending: Emergency Medicine | Admitting: Emergency Medicine

## 2016-10-29 ENCOUNTER — Emergency Department: Payer: Self-pay

## 2016-10-29 DIAGNOSIS — R1032 Left lower quadrant pain: Secondary | ICD-10-CM | POA: Insufficient documentation

## 2016-10-29 DIAGNOSIS — E039 Hypothyroidism, unspecified: Secondary | ICD-10-CM | POA: Insufficient documentation

## 2016-10-29 DIAGNOSIS — M549 Dorsalgia, unspecified: Secondary | ICD-10-CM | POA: Insufficient documentation

## 2016-10-29 LAB — CBC
HCT: 34.5 % — ABNORMAL LOW (ref 35.0–47.0)
Hemoglobin: 12 g/dL (ref 12.0–16.0)
MCH: 29.6 pg (ref 26.0–34.0)
MCHC: 34.8 g/dL (ref 32.0–36.0)
MCV: 85.1 fL (ref 80.0–100.0)
Platelets: 298 10*3/uL (ref 150–440)
RBC: 4.06 MIL/uL (ref 3.80–5.20)
RDW: 13.6 % (ref 11.5–14.5)
WBC: 8.1 10*3/uL (ref 3.6–11.0)

## 2016-10-29 LAB — URINALYSIS, COMPLETE (UACMP) WITH MICROSCOPIC
Bacteria, UA: NONE SEEN
Bilirubin Urine: NEGATIVE
Glucose, UA: NEGATIVE mg/dL
Hgb urine dipstick: NEGATIVE
Ketones, ur: 5 mg/dL — AB
Leukocytes, UA: NEGATIVE
Nitrite: NEGATIVE
Protein, ur: NEGATIVE mg/dL
Specific Gravity, Urine: 1.008 (ref 1.005–1.030)
pH: 7 (ref 5.0–8.0)

## 2016-10-29 LAB — COMPREHENSIVE METABOLIC PANEL
ALT: 32 U/L (ref 14–54)
AST: 26 U/L (ref 15–41)
Albumin: 4.6 g/dL (ref 3.5–5.0)
Alkaline Phosphatase: 40 U/L (ref 38–126)
Anion gap: 10 (ref 5–15)
BUN: 7 mg/dL (ref 6–20)
CO2: 22 mmol/L (ref 22–32)
Calcium: 9.2 mg/dL (ref 8.9–10.3)
Chloride: 106 mmol/L (ref 101–111)
Creatinine, Ser: 0.71 mg/dL (ref 0.44–1.00)
GFR calc Af Amer: >60 mL/min (ref >60)
GFR calc non Af Amer: >60 mL/min (ref >60)
Glucose, Bld: 114 mg/dL — ABNORMAL HIGH (ref 65–99)
Potassium: 3.1 mmol/L — ABNORMAL LOW (ref 3.5–5.1)
Sodium: 138 mmol/L (ref 135–145)
Total Bilirubin: 0.8 mg/dL (ref 0.3–1.2)
Total Protein: 8.4 g/dL — ABNORMAL HIGH (ref 6.5–8.1)

## 2016-10-29 LAB — POCT PREGNANCY, URINE: Preg Test, Ur: NEGATIVE

## 2016-10-29 LAB — LIPASE, BLOOD: Lipase: 22 U/L (ref 11–51)

## 2016-10-29 IMAGING — CT CT RENAL STONE PROTOCOL
2 of 4 series · 16 of 46 positions shown, 18 images · non-contrast
Comparison: None.

CLINICAL DATA: LEFT lower quadrant pain radiating to back with
nausea today. History of hiatal hernia, cholecystectomy.

EXAM:
CT ABDOMEN AND PELVIS WITHOUT CONTRAST
TECHNIQUE: Multidetector CT imaging of the abdomen and pelvis was performed
following the standard protocol without IV contrast.

[Series 2: stone full standard · axial · 0.87mm/px · z∈[-509,-84]mm · 13 of 93 slices shown, 15 images]
[im 4/93  soft-tissue]
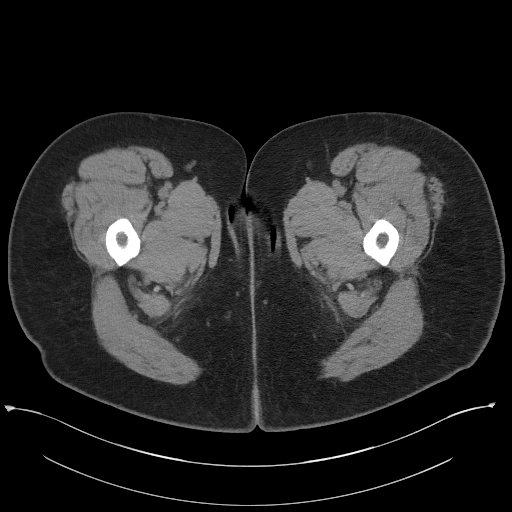
[im 4/93  bone]
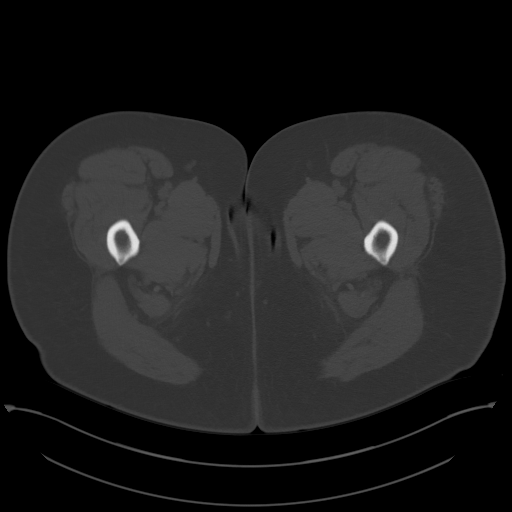
[im 12/93  soft-tissue]
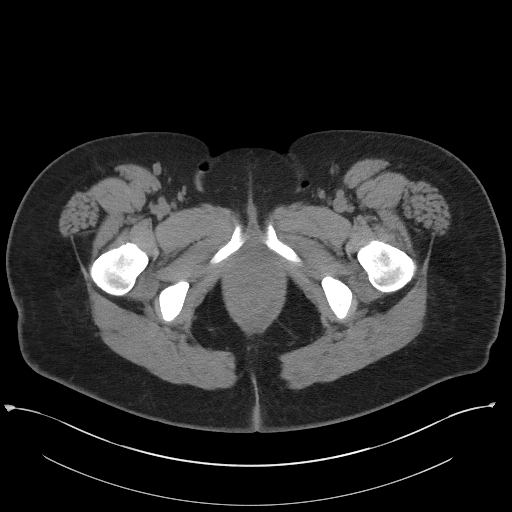
[im 20/93  soft-tissue]
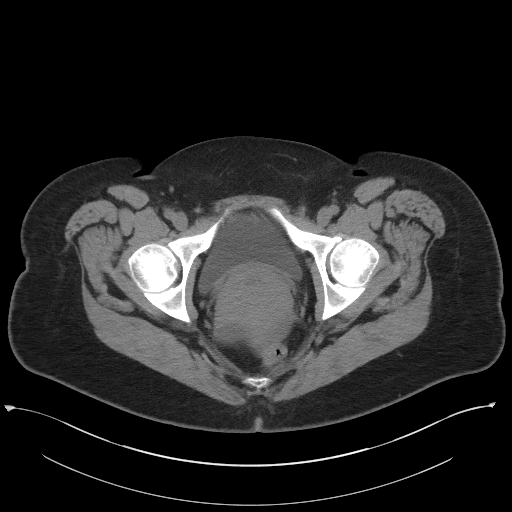
[im 27/93  soft-tissue]
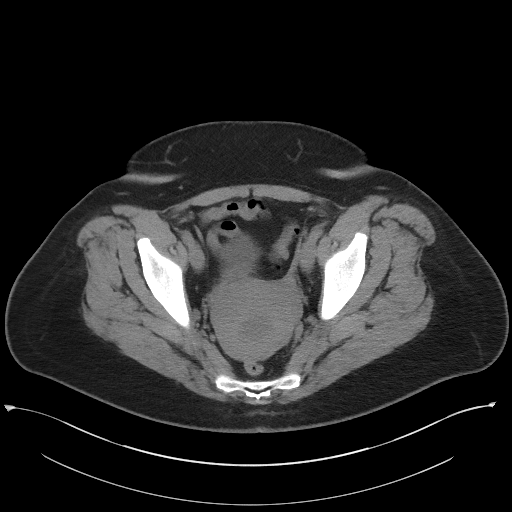
[im 31/93  soft-tissue]
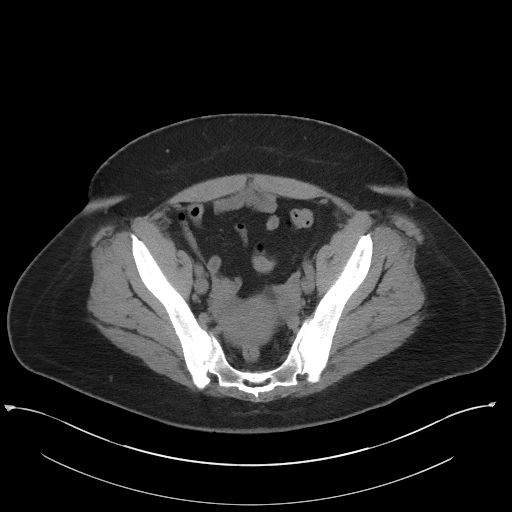
[im 39/93  soft-tissue]
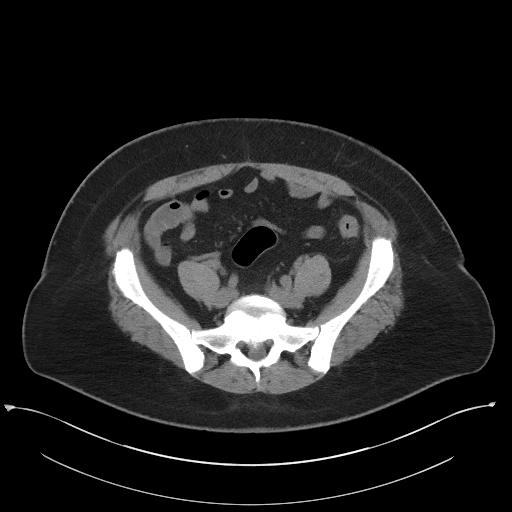
[im 47/93  soft-tissue]
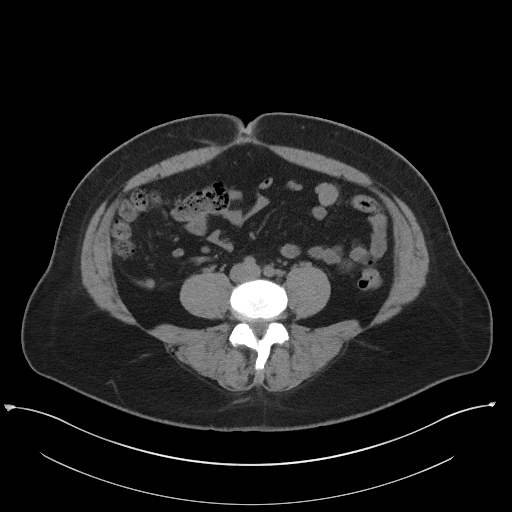
[im 54/93  soft-tissue]
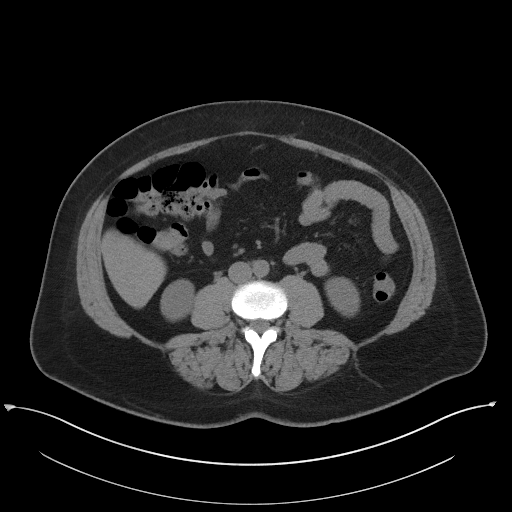
[im 62/93  soft-tissue]
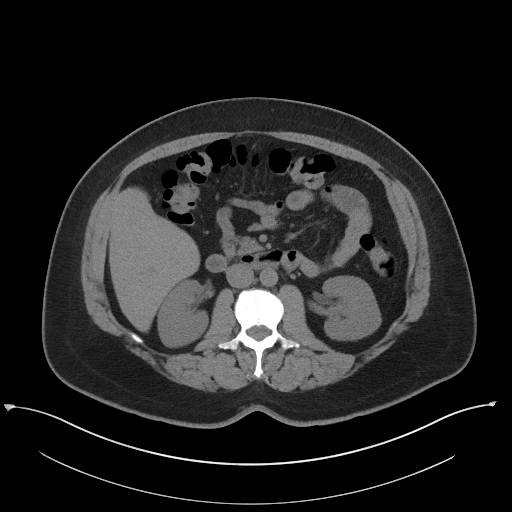
[im 62/93  bone]
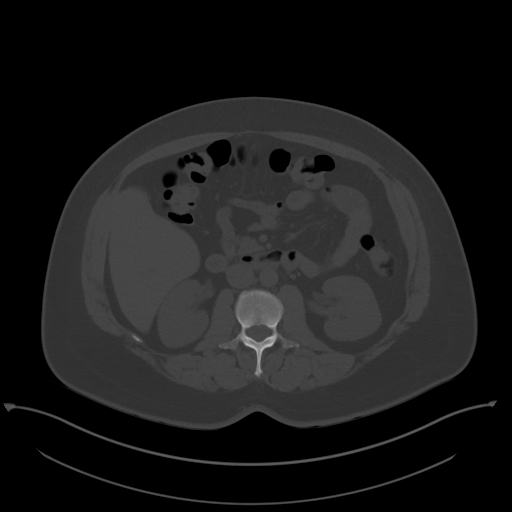
[im 66/93  soft-tissue]
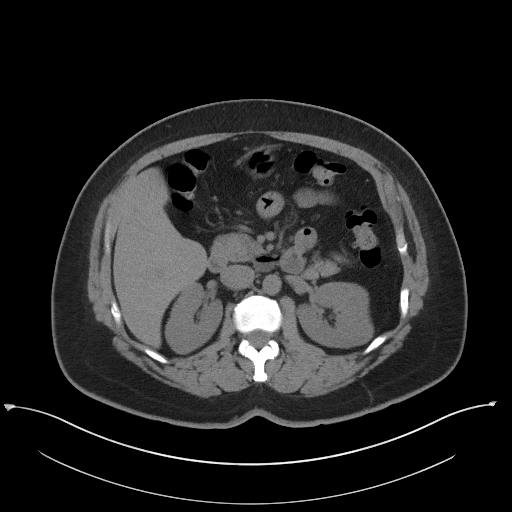
[im 73/93  soft-tissue]
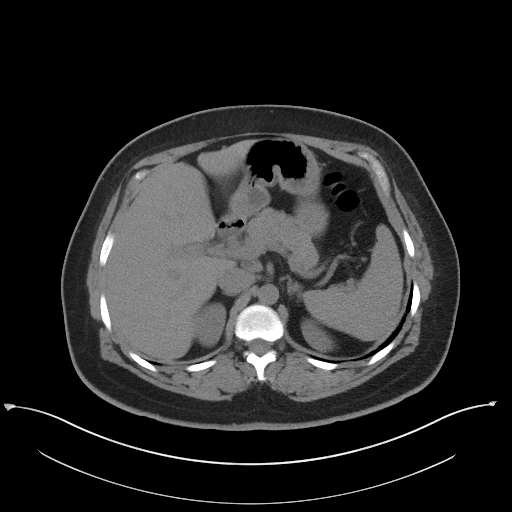
[im 81/93  soft-tissue]
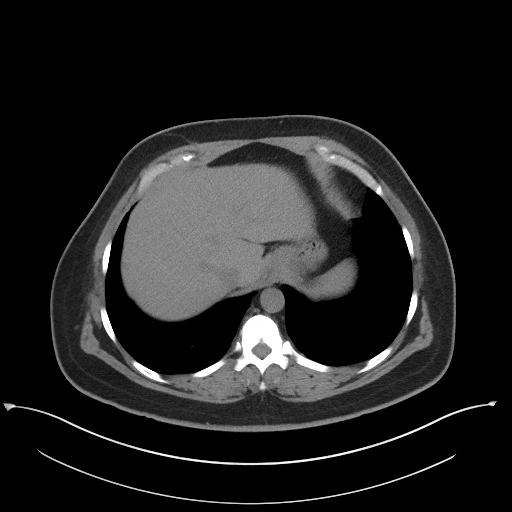
[im 89/93  soft-tissue]
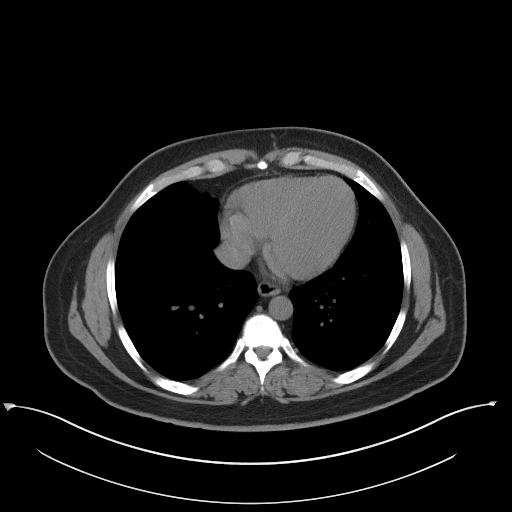

[Series 5: coronal · coronal · 0.83mm/px · 3 of 130 slices shown]
[im 44/130  soft-tissue]
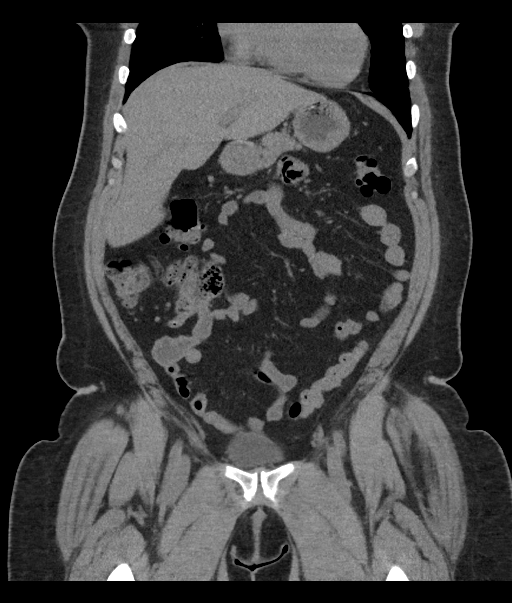
[im 58/130  soft-tissue]
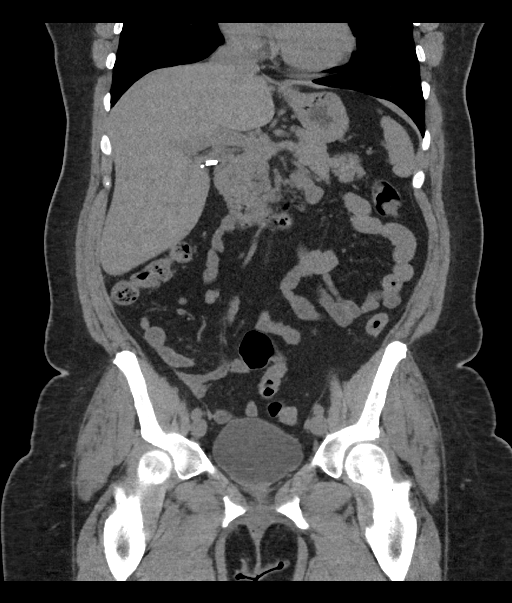
[im 72/130  soft-tissue]
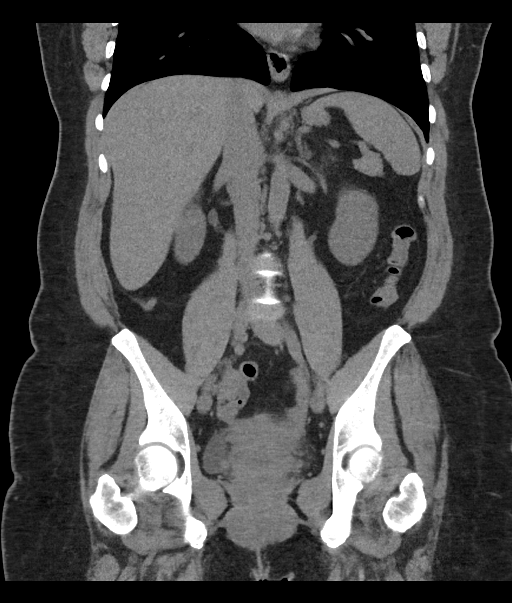

[16 of 46 positions shown; findings below may reference images not displayed]

FINDINGS: LOWER CHEST: 4 mm LEFT lower lobe sub solid pulmonary nodule, 3 mm
lingular sub solid pulmonary nodule ; no routine follow-up
indicated. The visualized heart size is normal. No pericardial
effusion. Mild gas distended distal esophagus associated with reflux
disease.

HEPATOBILIARY: Status post cholecystectomy. Normal noncontrast CT
liver.

PANCREAS: Normal.

SPLEEN: Normal.

ADRENALS/URINARY TRACT: Kidneys are orthotopic, demonstrating normal
size and morphology. No nephrolithiasis, hydronephrosis; limited
assessment for renal masses on this nonenhanced examination. The
unopacified ureters are normal in course and caliber. Urinary
bladder is partially distended and unremarkable. Normal adrenal
glands.

STOMACH/BOWEL: The stomach, small and large bowel are normal in
course and caliber without inflammatory changes, sensitivity
decreased by lack of enteric contrast. Small distal duodenum
diverticulum. Normal appendix.

VASCULAR/LYMPHATIC: Aortoiliac vessels are normal in course and
caliber. No lymphadenopathy by CT size criteria.

REPRODUCTIVE: Normal.  2.4 cm benign-appearing RIGHT adnexal cyst.

OTHER: Trace free fluid in the pelvis is likely physiologic.

MUSCULOSKELETAL: Non-acute. Mild LEFT greater than RIGHT sacroiliac
osteoarthrosis versus sacroiliitis. Small fat containing
supraumbilical ventral hernia. Anterior abdominal wall scarring.
IMPRESSION: No urolithiasis, obstructive uropathy nor acute
intra-abdominal/pelvic process.

## 2016-10-29 MED ORDER — OXYCODONE-ACETAMINOPHEN 5-325 MG PO TABS
1.0000 | ORAL_TABLET | Freq: Once | ORAL | Status: AC
  Administered 2016-10-29: 1 via ORAL
  Filled 2016-10-29: qty 1

## 2016-10-29 NOTE — ED Triage Notes (Signed)
Pt reports to ED w/ c/o abd pain and back pain that started today.  Pt c.o nausea, reports +PO intake. Pt A/OX4, resp even and unlabored, denies CP SOB, fever or LOC.  Pt sts abd pain is in LLQ and radiates to back.  Ambulatory to triage w/o issue

## 2016-10-29 NOTE — ED Provider Notes (Signed)
[facility] Emergency Department Provider Note  ____________________________________________   I have reviewed the triage vital signs and the nursing notes.   HISTORY  Chief Complaint Abdominal Pain and Back Pain    HPI Morgan Stevens is a 51 y.o. female  who presents today complaining of left-sided abdominal pain for a few hours. We know this patient very well on its emergency department from her 15 prior visits the last 10 months for various and complaints. She's had negative heart catheter's for chest pain and she's had jaw pain she's had Shoulder pain and leg pain, today's abdominal pain. She states she has a numbness feeling on the right side of her lower abdomen and the pain on the left side of the lower abdomen. She does not have her gallbladder. Nothing makes the pain better and nothing makes it worse, does go to her back. She has no history of kidney stones. She denies dysuria or urinary frequency. This is about the time for a period but she states this is not likely to be her menstrual pain because is different. She states that she has had no fever or vomiting or diarrhea. She has no other associated alleviating or aggravating symptoms.     Past Medical History:  Diagnosis Date  . Hiatal hernia   . Obesity (BMI 30-39.9)   . Reflux   . Thyroid disease     Patient Active Problem List   Diagnosis Date Noted  . Pain in the chest   . Abnormal cardiac function test 02/26/2016  . Obesity (BMI 30-39.9)   . Angina pectoris (HCC) 02/25/2016  . Dyspnea 02/25/2016  . Hypothyroidism 02/25/2016  . Chest pain 09/07/2015    Past Surgical History:  Procedure Laterality Date  . CARDIAC CATHETERIZATION  02/27/2016   Procedure: Left Heart Cath and Coronary Angiography;  Surgeon: Corky CraftsJayadeep S Varanasi, MD;  [facility];  Service: Cardiovascular;;  . CESAREAN SECTION    . CHOLECYSTECTOMY    . THYROIDECTOMY    . THYROIDECTOMY      Prior to  Admission medications   Medication Sig Start Date End Date Taking? Authorizing Provider  levothyroxine (SYNTHROID, LEVOTHROID) 137 MCG tablet Take 137 mcg by mouth daily before breakfast.    Historical Provider, MD  naproxen (NAPROSYN) 500 MG tablet Take 1 tablet (500 mg total) by mouth 2 (two) times daily with a meal. 10/24/16   Evon Slackhomas C Gaines, PA-C  omeprazole (PRILOSEC) 20 MG capsule Take 20 mg by mouth daily.    Historical Provider, MD  ranitidine (ZANTAC) 150 MG tablet Take 1 tablet (150 mg total) by mouth 2 (two) times daily. 07/28/16 07/28/17  Willy EddyPatrick Robinson, MD    Allergies Patient has no known allergies.  Family History  Problem Relation Age of Onset  . Breast cancer Neg Hx     Social History Social History  Substance Use Topics  . Smoking status: Never Smoker  . Smokeless tobacco: Never Used  . Alcohol use No    Review of Systems Constitutional: No fever/chills Eyes: No visual changes. ENT: No sore throat. No stiff neck no neck pain Cardiovascular: Denies chest pain. Respiratory: Denies shortness of breath. Gastrointestinal:   no vomiting.  No diarrhea.  No constipation. Genitourinary: Negative for dysuria. Musculoskeletal: Negative lower extremity swelling Skin: Negative for rash. Neurological: Negative for severe headaches, focal weakness or numbness. 10-point ROS otherwise negative.  ____________________________________________   PHYSICAL EXAM:  VITAL SIGNS: ED Triage Vitals  Enc Vitals Group  BP 10/29/16 2107 (!) 147/78     Pulse Rate 10/29/16 2107 83     Resp 10/29/16 2107 20     Temp 10/29/16 2107 98.1 F (36.7 C)     Temp Source 10/29/16 2107 Oral     SpO2 10/29/16 2107 100 %     Weight 10/29/16 2107 208 lb (94.3 kg)     Height 10/29/16 2107 5\' 4"  (1.626 m)     Head Circumference --      Peak Flow --      Pain Score 10/29/16 2108 8     Pain Loc --      Pain Edu? --      Excl. in GC? --     Constitutional: Alert and oriented. Well  appearing and in no acute distress. Eyes: Conjunctivae are normal. PERRL. EOMI. Head: Atraumatic. Nose: No congestion/rhinnorhea. Mouth/Throat: Mucous membranes are moist.  Oropharynx non-erythematous. Neck: No stridor.   Nontender with no meningismus Cardiovascular: Normal rate, regular rhythm. Grossly normal heart sounds.  Good peripheral circulation. Respiratory: Normal respiratory effort.  No retractions. Lungs CTAB. Abdominal: Soft and Obesity noted, there is tenderness to palpation in the left lower quadrant towards the left flank.. No distention. No guarding no rebound Back:  There is no focal tenderness or step off.  there is no midline tenderness there are no lesions noted. there is no CVA tenderness  Musculoskeletal: No lower extremity tenderness, no upper extremity tenderness. No joint effusions, no DVT signs strong distal pulses no edema Neurologic:  Normal speech and language. No gross focal neurologic deficits are appreciated.  Skin:  Skin is warm, dry and intact. No rash noted. Psychiatric: Mood and affect are normal. Speech and behavior are normal.  ____________________________________________   LABS (all labs ordered are listed, but only abnormal results are displayed)  Labs Reviewed  COMPREHENSIVE METABOLIC PANEL - Abnormal; Notable for the following:       Result Value   Potassium 3.1 (*)    Glucose, Bld 114 (*)    Total Protein 8.4 (*)    All other components within normal limits  CBC - Abnormal; Notable for the following:    HCT 34.5 (*)    All other components within normal limits  LIPASE, BLOOD  URINALYSIS, COMPLETE (UACMP) WITH MICROSCOPIC   ____________________________________________  EKG  I personally interpreted any EKGs ordered by me or triage  ____________________________________________  RADIOLOGY  I reviewed any imaging ordered by me or triage that were performed during my shift and, if possible, patient and/or family made aware of any  abnormal findings. ____________________________________________   PROCEDURES  Procedure(s) performed: None  Procedures  Critical Care performed: None  ____________________________________________   INITIAL IMPRESSION / ASSESSMENT AND PLAN / ED COURSE  Pertinent labs & imaging results that were available during my care of the patient were reviewed by me and considered in my medical decision making (see chart for details).  Patient with multiple chronic pain issues who is here with a great deal of frequency with pain complaints presents today with abdominal pain. Blood work and vital signs are reassuring. She has reproducible abdominal pain however they goes towards her flank. Concern for kidney stone does exist. We will obtain imaging to rule that out at that is negative and she'll be safely able to go home. She assures me she is not pregnant we'll get a urine pregnancy, we will also check urinalysis. I will give her some pain medication and she complains of significant discomfort although she  is in no acute distress and appears quite comfortable.    ____________________________________________   FINAL CLINICAL IMPRESSION(S) / ED DIAGNOSES  Final diagnoses:  None      This chart was dictated using voice recognition software.  Despite best efforts to proofread,  errors can occur which can change meaning.      Jeanmarie Plant, MD 10/29/16 [phone]

## 2016-12-17 ENCOUNTER — Emergency Department: Payer: Medicaid Other

## 2016-12-17 ENCOUNTER — Emergency Department
Admission: EM | Admit: 2016-12-17 | Discharge: 2016-12-17 | Disposition: A | Discharge status: Home / Self Care | Payer: Medicaid Other | Attending: Emergency Medicine | Admitting: Emergency Medicine

## 2016-12-17 DIAGNOSIS — R079 Chest pain, unspecified: Secondary | ICD-10-CM | POA: Insufficient documentation

## 2016-12-17 DIAGNOSIS — Z5321 Procedure and treatment not carried out due to patient leaving prior to being seen by health care provider: Secondary | ICD-10-CM | POA: Insufficient documentation

## 2016-12-17 LAB — BASIC METABOLIC PANEL
Anion gap: 6 (ref 5–15)
BUN: 9 mg/dL (ref 6–20)
CO2: 25 mmol/L (ref 22–32)
Calcium: 8.8 mg/dL — ABNORMAL LOW (ref 8.9–10.3)
Chloride: 106 mmol/L (ref 101–111)
Creatinine, Ser: 0.64 mg/dL (ref 0.44–1.00)
GFR calc Af Amer: >60 mL/min (ref >60)
GFR calc non Af Amer: >60 mL/min (ref >60)
Glucose, Bld: 108 mg/dL — ABNORMAL HIGH (ref 65–99)
Potassium: 4 mmol/L (ref 3.5–5.1)
Sodium: 137 mmol/L (ref 135–145)

## 2016-12-17 LAB — CBC
HCT: 34.4 % — ABNORMAL LOW (ref 35.0–47.0)
Hemoglobin: 11.9 g/dL — ABNORMAL LOW (ref 12.0–16.0)
MCH: 28.8 pg (ref 26.0–34.0)
MCHC: 34.4 g/dL (ref 32.0–36.0)
MCV: 83.7 fL (ref 80.0–100.0)
Platelets: 290 10*3/uL (ref 150–440)
RBC: 4.11 MIL/uL (ref 3.80–5.20)
RDW: 14.3 % (ref 11.5–14.5)
WBC: 6.2 10*3/uL (ref 3.6–11.0)

## 2016-12-17 LAB — TROPONIN I: Troponin I: <0.03 ng/mL (ref <0.03)

## 2016-12-17 IMAGING — CR DG CHEST 2V
2 series · 2 of 2 positions shown · non-contrast
Comparison: [DATE] and earlier.

CLINICAL DATA: 50-year-old female with chest pain and shortness of
Breath acute onset today.

EXAM:
CHEST  2 VIEW

[chest pa]
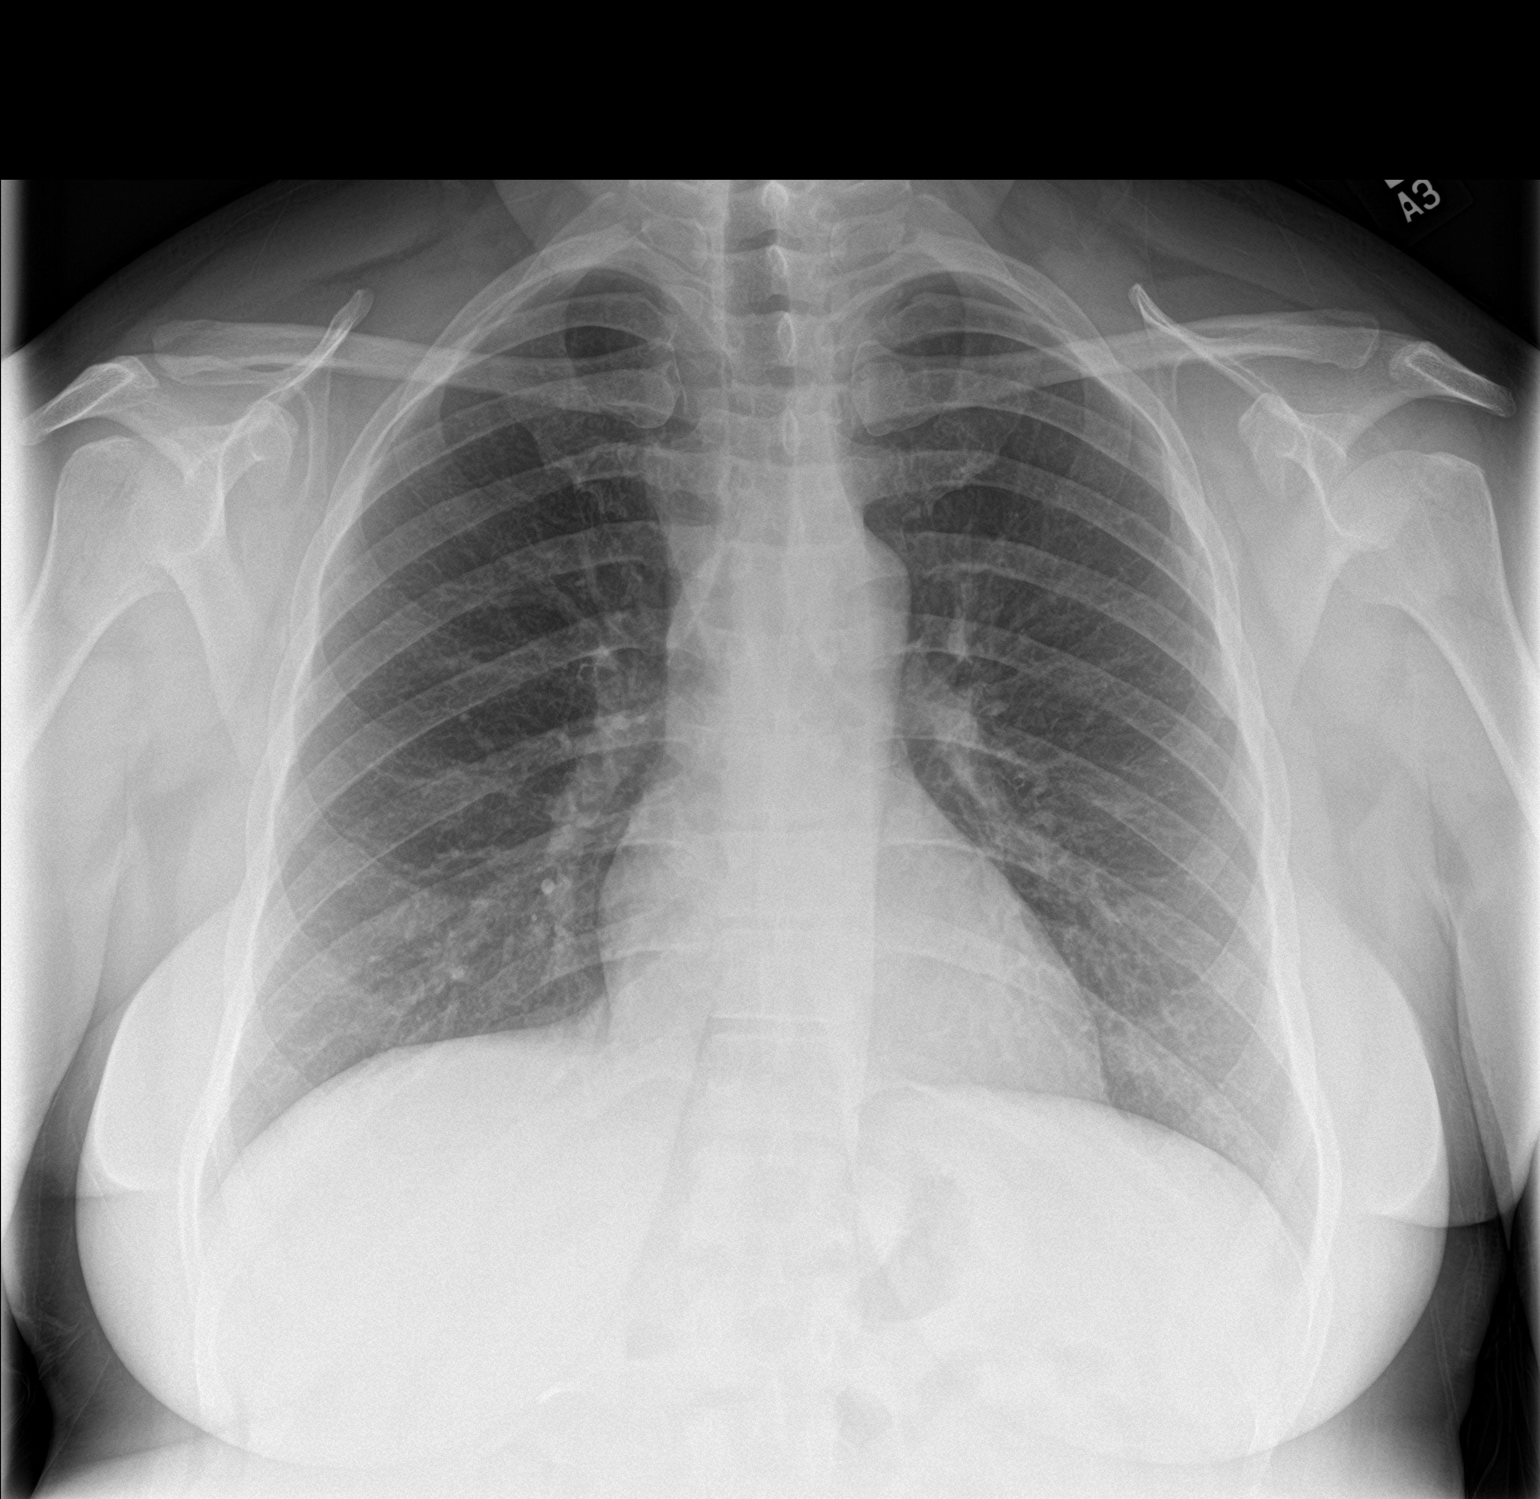

[chest lat]
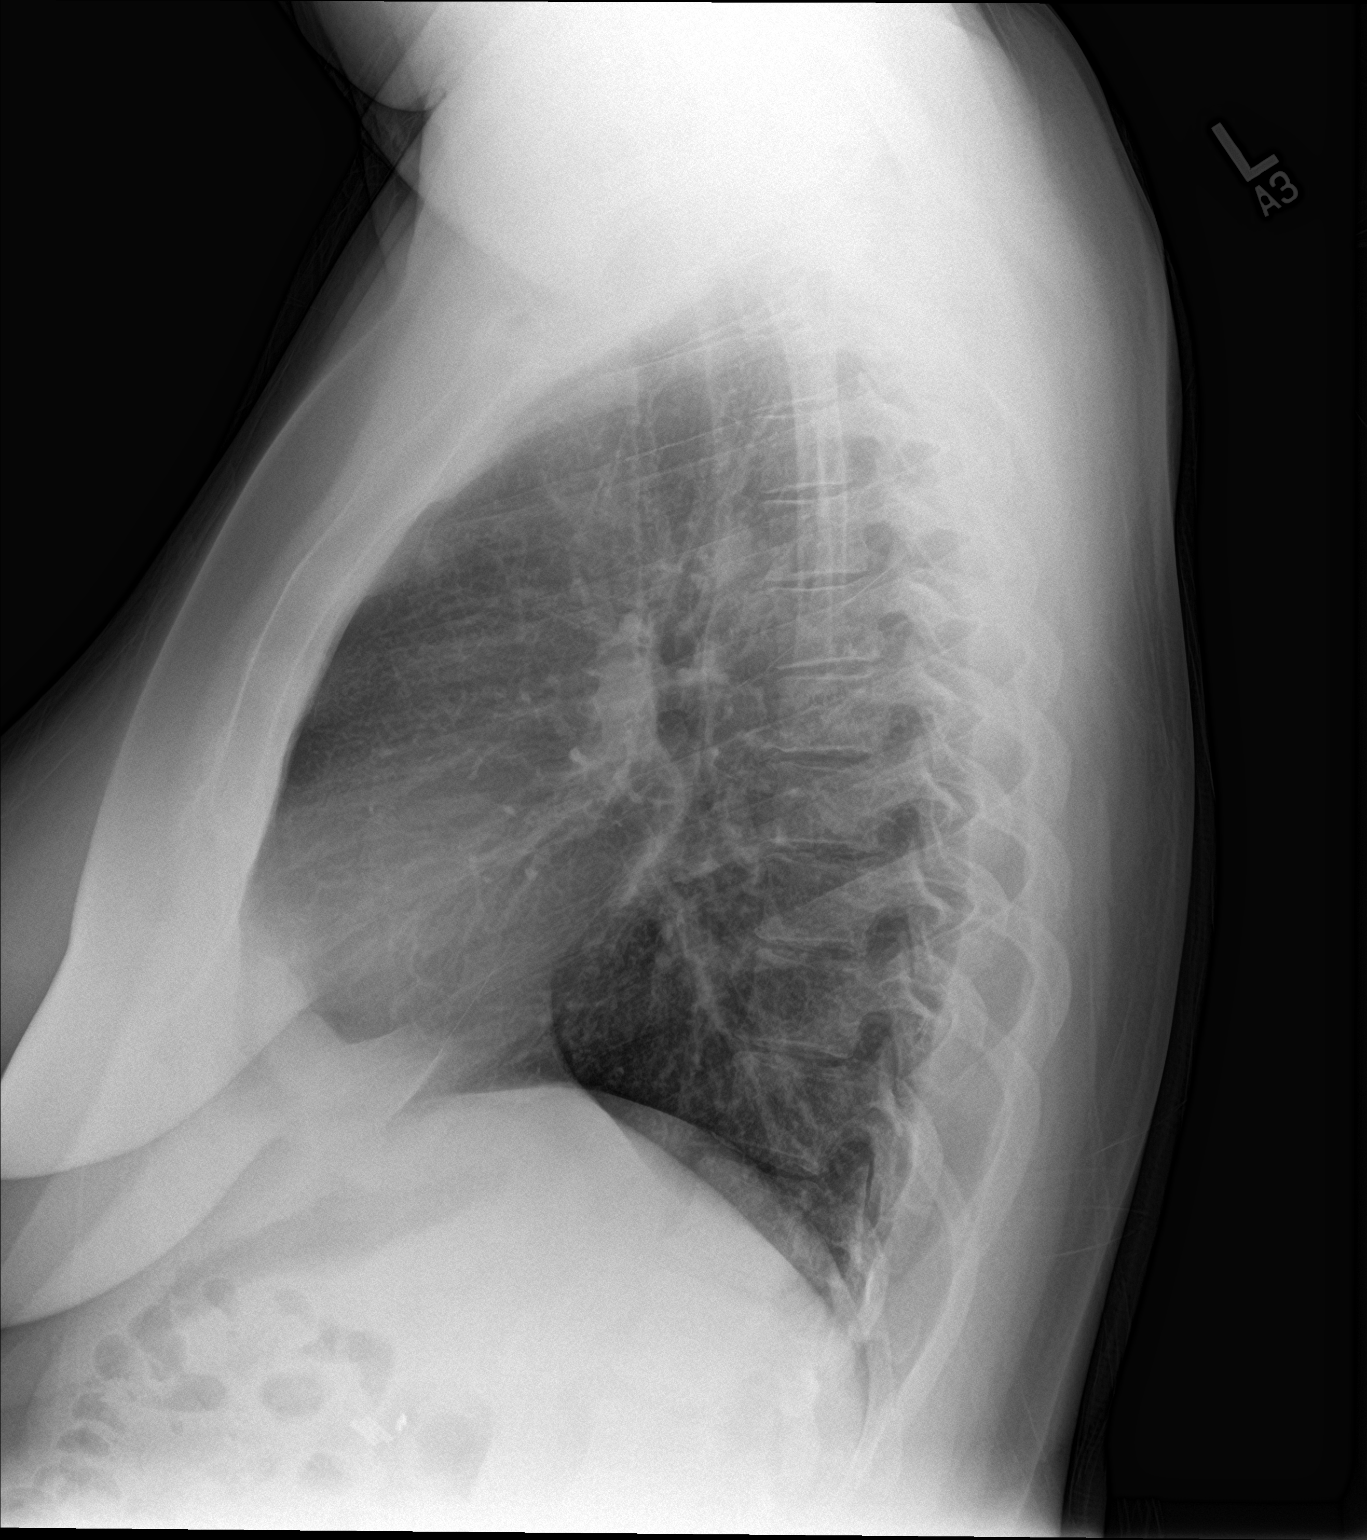

[2 of 2 positions shown; findings below may reference images not displayed]

FINDINGS: Lung volumes remain normal. Mediastinal contours remain normal.
Visualized tracheal air column is within normal limits. Lung
parenchyma appears stable and clear. No pneumothorax or pleural
effusion. Stable cholecystectomy clips. Negative visible bowel gas
pattern. Slight levoconvex curvature of the thoracic spine. No acute
osseous abnormality identified.
IMPRESSION: Stable and negative.  No acute cardiopulmonary abnormality.

## 2016-12-17 NOTE — ED Notes (Signed)
Pt called multiple times. Pt did not present when name called.

## 2016-12-17 NOTE — ED Triage Notes (Signed)
States central CP that goes to jaw and shoulders that began today. Alert, oriented, no distress noted at this time.

## 2016-12-18 ENCOUNTER — Telehealth: Payer: Self-pay | Admitting: Emergency Medicine

## 2016-12-18 NOTE — Telephone Encounter (Signed)
Called patient due to lwot to inquire about condition and follow up plans. Left message.   

## 2016-12-26 ENCOUNTER — Emergency Department
Admission: EM | Admit: 2016-12-26 | Discharge: 2016-12-26 | Disposition: A | Discharge status: Home / Self Care | Payer: Medicaid Other | Attending: Emergency Medicine | Admitting: Emergency Medicine

## 2016-12-26 ENCOUNTER — Encounter: Payer: Self-pay | Admitting: Emergency Medicine

## 2016-12-26 DIAGNOSIS — E039 Hypothyroidism, unspecified: Secondary | ICD-10-CM | POA: Insufficient documentation

## 2016-12-26 DIAGNOSIS — Z79899 Other long term (current) drug therapy: Secondary | ICD-10-CM | POA: Insufficient documentation

## 2016-12-26 DIAGNOSIS — K029 Dental caries, unspecified: Secondary | ICD-10-CM

## 2016-12-26 MED ORDER — LIDOCAINE VISCOUS 2 % MT SOLN: 5.0000 mL | Freq: Four times a day (QID) | OROMUCOSAL | 0 refills | Status: DC | PRN

## 2016-12-26 MED ORDER — TRAMADOL HCL 50 MG PO TABS
50.0000 mg | ORAL_TABLET | Freq: Once | ORAL | Status: AC
  Administered 2016-12-26: 50 mg via ORAL
  Filled 2016-12-26: qty 1

## 2016-12-26 MED ORDER — AMOXICILLIN 500 MG PO CAPS: 500.0000 mg | ORAL_CAPSULE | Freq: Three times a day (TID) | ORAL | 0 refills | Status: DC

## 2016-12-26 MED ORDER — TRAMADOL HCL 50 MG PO TABS: 50.0000 mg | ORAL_TABLET | Freq: Four times a day (QID) | ORAL | 0 refills | Status: DC | PRN

## 2016-12-26 MED ORDER — IBUPROFEN 600 MG PO TABS: 600.0000 mg | ORAL_TABLET | Freq: Three times a day (TID) | ORAL | 0 refills | Status: DC | PRN

## 2016-12-26 MED ORDER — LIDOCAINE VISCOUS 2 % MT SOLN
15.0000 mL | Freq: Once | OROMUCOSAL | Status: AC
  Administered 2016-12-26: 15 mL via OROMUCOSAL
  Filled 2016-12-26: qty 15

## 2016-12-26 NOTE — ED Provider Notes (Signed)
[facility] Emergency Department Provider Note   ____________________________________________   First MD Initiated Contact with Patient 12/26/16 1013     (approximate)  I have reviewed the triage vital signs and the nursing notes.   HISTORY  Chief Complaint Dental Pain    HPI Morgan Stevens is a 51 y.o. female patient presenting ED with dental pain which started yesterday. Pain is on the left lower molar area. Patient denies any fever associated this complaint. Patient has a history of devitalized teeth but no recent dental care.Patient rates the pain as a 10 over 10. No palliative measures taken for this complaint.   Past Medical History:  Diagnosis Date  . Hiatal hernia   . Obesity (BMI 30-39.9)   . Reflux   . Thyroid disease     Patient Active Problem List   Diagnosis Date Noted  . Pain in the chest   . Abnormal cardiac function test 02/26/2016  . Obesity (BMI 30-39.9)   . Angina pectoris (HCC) 02/25/2016  . Dyspnea 02/25/2016  . Hypothyroidism 02/25/2016  . Chest pain 09/07/2015    Past Surgical History:  Procedure Laterality Date  . CARDIAC CATHETERIZATION  02/27/2016   Procedure: Left Heart Cath and Coronary Angiography;  Surgeon: Corky Crafts, MD;  Location: [facility] INVASIVE CV LAB;  Service: Cardiovascular;;  . CESAREAN SECTION    . CHOLECYSTECTOMY    . THYROIDECTOMY    . THYROIDECTOMY      Prior to Admission medications   Medication Sig Start Date End Date Taking? Authorizing Provider  amoxicillin (AMOXIL) 500 MG capsule Take 1 capsule (500 mg total) by mouth 3 (three) times daily. 12/26/16   Joni Reining, PA-C  ibuprofen (ADVIL,MOTRIN) 600 MG tablet Take 1 tablet (600 mg total) by mouth every 8 (eight) hours as needed. 12/26/16   Joni Reining, PA-C  levothyroxine (SYNTHROID, LEVOTHROID) 137 MCG tablet Take 137 mcg by mouth daily before breakfast.    Historical Provider, MD  lidocaine (XYLOCAINE) 2 % solution Use as directed 5  mLs in the mouth or throat every 6 (six) hours as needed for mouth pain. 12/26/16   Joni Reining, PA-C  naproxen (NAPROSYN) 500 MG tablet Take 1 tablet (500 mg total) by mouth 2 (two) times daily with a meal. 10/24/16   Evon Slack, PA-C  omeprazole (PRILOSEC) 20 MG capsule Take 20 mg by mouth daily.    Historical Provider, MD  ranitidine (ZANTAC) 150 MG tablet Take 1 tablet (150 mg total) by mouth 2 (two) times daily. 07/28/16 07/28/17  Willy Eddy, MD  traMADol (ULTRAM) 50 MG tablet Take 1 tablet (50 mg total) by mouth every 6 (six) hours as needed for moderate pain. 12/26/16   Joni Reining, PA-C    Allergies Patient has no known allergies.  Family History  Problem Relation Age of Onset  . Breast cancer Neg Hx     Social History Social History  Substance Use Topics  . Smoking status: Never Smoker  . Smokeless tobacco: Never Used  . Alcohol use No    Review of Systems  Constitutional: No fever/chills Eyes: No visual changes. ENT: No sore throat. Cardiovascular: Denies chest pain. Respiratory: Denies shortness of breath. Gastrointestinal: No abdominal pain.  No nausea, no vomiting.  No diarrhea.  No constipation. Genitourinary: Negative for dysuria. Musculoskeletal: Negative for back pain. Skin: Negative for rash. Neurological: Negative for headaches, focal weakness or numbness. Endocrine:Hypothyroidism ____________________________________________   PHYSICAL EXAM:  VITAL SIGNS: ED Triage  Vitals   Enc Vitals Group     BP      Pulse      Resp      Temp      Temp src      SpO2      Weight 211 lb (95.7 kg)     Height $RemoveBe 26 m)     Head Circumference      Peak Flow      Pain Score 10     Pain Loc      Pain Edu?      Excl. in GC?     Constitutional: Alert and oriented. Well appearing and in no acute distress. Eyes: Conjunctivae are normal. PERRL. EOMI. Head: Atraumatic. Nose: No congestion/rhinnorhea. Mouth/Throat: Mucous membranes are moist.   Oropharynx non-erythematous.Devitalized teeth #18 and 19 with mild gingival edema. Neck: No stridor.  No cervical spine tenderness to palpation. Hematological/Lymphatic/Immunilogical: No cervical lymphadenopathy. Cardiovascular: Normal rate, regular rhythm. Grossly normal heart sounds.  Good peripheral circulation. Respiratory: Normal respiratory effort.  No retractions. Lungs CTAB. Gastrointestinal: Soft and nontender. No distention. No abdominal bruits. No CVA tenderness. Musculoskeletal: No lower extremity tenderness nor edema.  No joint effusions. Neurologic:  Normal speech and language. No gross focal neurologic deficits are appreciated. No gait instability. Skin:  Skin is warm, dry and intact. No rash noted. Psychiatric: Mood and affect are normal. Speech and behavior are normal.  ____________________________________________   LABS (all labs ordered are listed, but only abnormal results are displayed)  Labs Reviewed - No data to display ____________________________________________  EKG   ____________________________________________  RADIOLOGY   ____________________________________________   PROCEDURES  Procedure(s) performed: None  Procedures  Critical Care performed: No  ____________________________________________   INITIAL IMPRESSION / ASSESSMENT AND PLAN / ED COURSE  Pertinent labs & imaging results that were available during my care of the patient were reviewed by me and considered in my medical decision making (see chart for details).  Dental pain secondary to caries. Patient given discharge care instructions. Patient advised to follow-up at the walk-in dental clinic. Patient given prescription for amoxicillin, tramadol for 2 days, ibuprofen, viscous lidocaine.      ____________________________________________   FINAL CLINICAL IMPRESSION(S) / ED DIAGNOSES  Final diagnoses:  Pain due to dental caries      NEW MEDICATIONS STARTED DURING THIS  VISIT:  New Prescriptions   AMOXICILLIN (AMOXIL) 500 MG CAPSULE    Take 1 capsule (500 mg total) by mouth 3 (three) times daily.   IBUPROFEN (ADVIL,MOTRIN) 600 MG TABLET    Take 1 tablet (600 mg total) by mouth every 8 (eight) hours as needed.   LIDOCAINE (XYLOCAINE) 2 % SOLUTION    Use as directed 5 mLs in the mouth or throat every 6 (six) hours as needed for mouth pain.   TRAMADOL (ULTRAM) 50 MG TABLET    Take 1 tablet (50 mg total) by mouth every 6 (six) hours as needed for moderate pain.     Note:  This document was prepared using Dragon voice recognition software and may include unintentional dictation errors.    Joni Reining, PA-C 12/26/16 1016    Governor Rooks, MD 12/26/16 [phone]

## 2016-12-26 NOTE — ED Notes (Signed)
See triage note   States she developed pain to left jaw area yesterday  More swelling this am

## 2016-12-26 NOTE — ED Triage Notes (Signed)
Patient presents to the ED with dental pain to her left lower jaw.  Patient states pain began yesterday and now jaw is slightly swollen.  Patient is holding mouth.

## 2016-12-26 NOTE — Discharge Instructions (Signed)
Follow up with dental clinic

## 2017-02-23 ENCOUNTER — Encounter: Payer: Self-pay | Admitting: Emergency Medicine

## 2017-02-23 ENCOUNTER — Emergency Department: Payer: Self-pay

## 2017-02-23 ENCOUNTER — Emergency Department
Admission: EM | Admit: 2017-02-23 | Discharge: 2017-02-23 | Disposition: A | Discharge status: Home / Self Care | Payer: Self-pay | Attending: Emergency Medicine | Admitting: Emergency Medicine

## 2017-02-23 DIAGNOSIS — Z7902 Long term (current) use of antithrombotics/antiplatelets: Secondary | ICD-10-CM | POA: Insufficient documentation

## 2017-02-23 DIAGNOSIS — R0789 Other chest pain: Secondary | ICD-10-CM | POA: Insufficient documentation

## 2017-02-23 DIAGNOSIS — Z79899 Other long term (current) drug therapy: Secondary | ICD-10-CM | POA: Insufficient documentation

## 2017-02-23 DIAGNOSIS — E039 Hypothyroidism, unspecified: Secondary | ICD-10-CM | POA: Insufficient documentation

## 2017-02-23 DIAGNOSIS — R079 Chest pain, unspecified: Secondary | ICD-10-CM

## 2017-02-23 LAB — BASIC METABOLIC PANEL
Anion gap: 6 (ref 5–15)
BUN: 10 mg/dL (ref 6–20)
CO2: 24 mmol/L (ref 22–32)
Calcium: 8.9 mg/dL (ref 8.9–10.3)
Chloride: 106 mmol/L (ref 101–111)
Creatinine, Ser: 0.81 mg/dL (ref 0.44–1.00)
GFR calc Af Amer: >60 mL/min (ref >60)
GFR calc non Af Amer: >60 mL/min (ref >60)
Glucose, Bld: 113 mg/dL — ABNORMAL HIGH (ref 65–99)
Potassium: 3.3 mmol/L — ABNORMAL LOW (ref 3.5–5.1)
Sodium: 136 mmol/L (ref 135–145)

## 2017-02-23 LAB — HEPATIC FUNCTION PANEL
ALT: 23 U/L (ref 14–54)
AST: 25 U/L (ref 15–41)
Albumin: 3.9 g/dL (ref 3.5–5.0)
Alkaline Phosphatase: 31 U/L — ABNORMAL LOW (ref 38–126)
Bilirubin, Direct: <0.1 mg/dL — ABNORMAL LOW (ref 0.1–0.5)
Total Bilirubin: 0.6 mg/dL (ref 0.3–1.2)
Total Protein: 7.4 g/dL (ref 6.5–8.1)

## 2017-02-23 LAB — CBC
HCT: 32.8 % — ABNORMAL LOW (ref 35.0–47.0)
Hemoglobin: 11.4 g/dL — ABNORMAL LOW (ref 12.0–16.0)
MCH: 28.7 pg (ref 26.0–34.0)
MCHC: 34.6 g/dL (ref 32.0–36.0)
MCV: 83 fL (ref 80.0–100.0)
Platelets: 325 10*3/uL (ref 150–440)
RBC: 3.95 MIL/uL (ref 3.80–5.20)
RDW: 14.4 % (ref 11.5–14.5)
WBC: 7.2 10*3/uL (ref 3.6–11.0)

## 2017-02-23 LAB — TROPONIN I
Troponin I: <0.03 ng/mL (ref <0.03)
Troponin I: <0.03 ng/mL (ref <0.03)

## 2017-02-23 LAB — LIPASE, BLOOD: Lipase: 39 U/L (ref 11–51)

## 2017-02-23 IMAGING — CR DG CHEST 2V
2 series · 2 of 2 positions shown · non-contrast
Comparison: [DATE]

CLINICAL DATA: Chest pain and shortness of breath.

EXAM:
CHEST  2 VIEW

[chest pa]
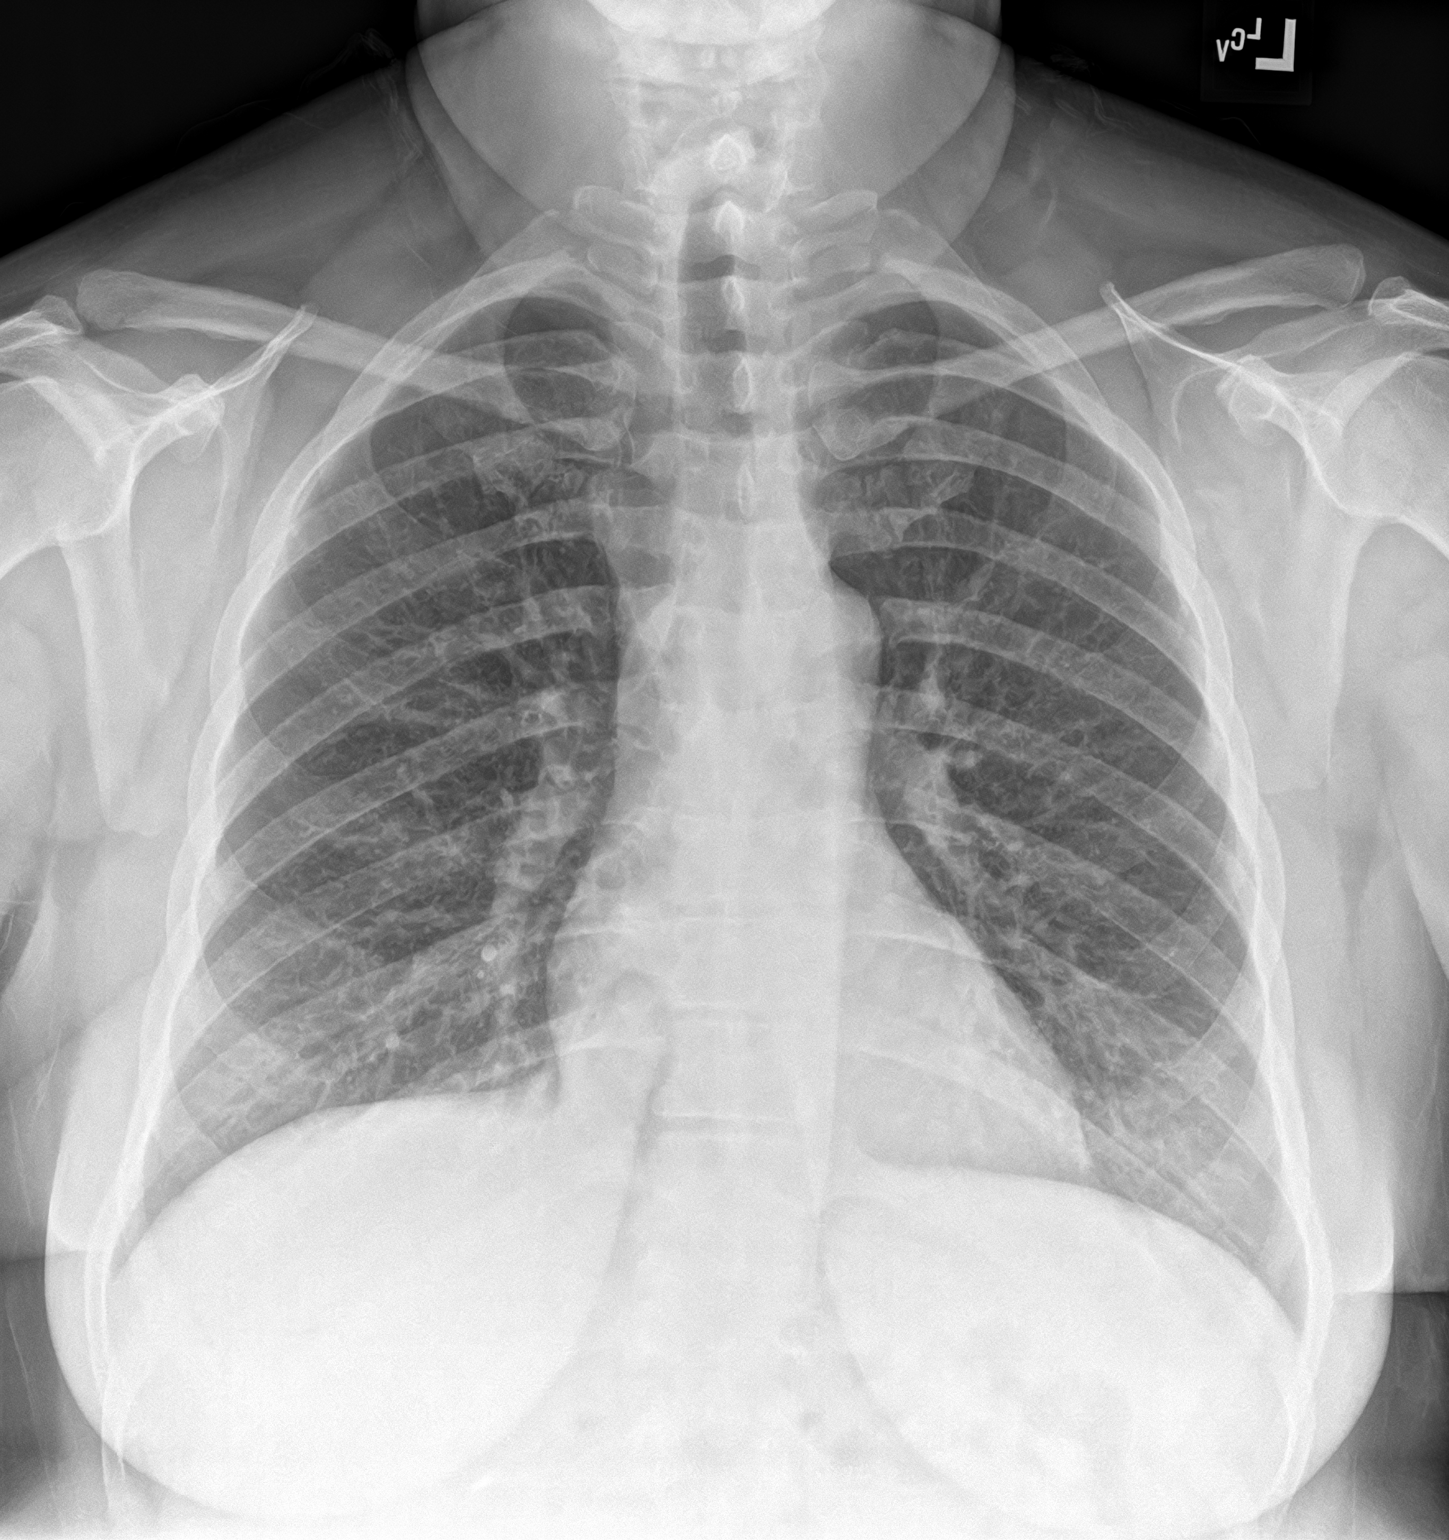

[chest lat]
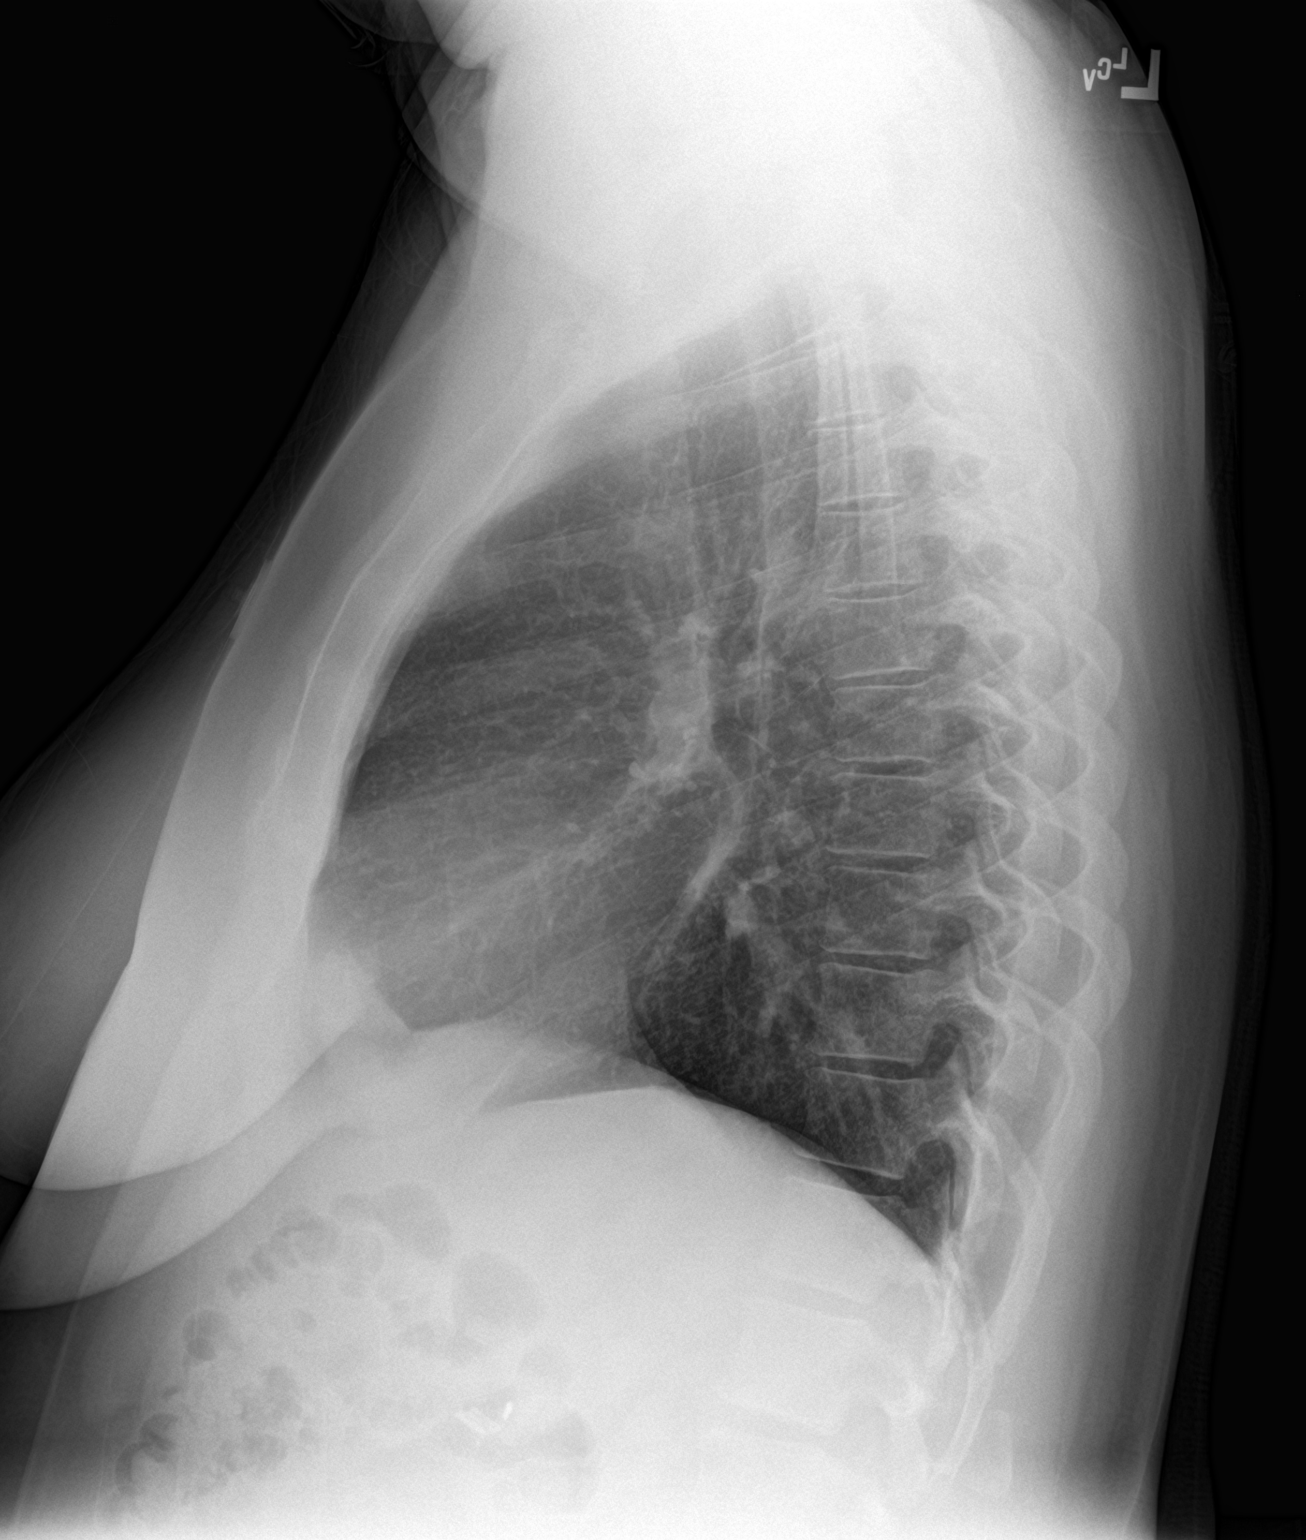

[2 of 2 positions shown; findings below may reference images not displayed]

FINDINGS: The heart size and mediastinal contours are within normal limits.
Both lungs are clear. The visualized skeletal structures are
unremarkable.
IMPRESSION: No active cardiopulmonary disease.

## 2017-02-23 MED ORDER — NITROGLYCERIN 0.4 MG SL SUBL
0.4000 mg | SUBLINGUAL_TABLET | SUBLINGUAL | Status: DC | PRN
  Administered 2017-02-23 (×2): 0.4 mg via SUBLINGUAL
  Filled 2017-02-23: qty 1

## 2017-02-23 MED ORDER — ACETAMINOPHEN 500 MG PO TABS
1000.0000 mg | ORAL_TABLET | ORAL | Status: AC
  Administered 2017-02-23: 1000 mg via ORAL
  Filled 2017-02-23: qty 2

## 2017-02-23 MED ORDER — SODIUM CHLORIDE 0.9 % IV BOLUS (SEPSIS)
500.0000 mL | Freq: Once | INTRAVENOUS | Status: AC
  Administered 2017-02-23: 500 mL via INTRAVENOUS

## 2017-02-23 MED ORDER — ASPIRIN 81 MG PO CHEW
324.0000 mg | CHEWABLE_TABLET | Freq: Once | ORAL | Status: AC
  Administered 2017-02-23: 324 mg via ORAL
  Filled 2017-02-23: qty 4

## 2017-02-23 NOTE — ED Provider Notes (Signed)
[facility] Emergency Department Provider Note   ____________________________________________   First MD Initiated Contact with Patient 02/23/17 [phone]     (approximate)  I have reviewed the triage vital signs and the nursing notes.   HISTORY  Chief Complaint Chest Pain    HPI Morgan Stevens is a 51 y.o. female who awoke at about 4 this morning with pain in her chest. Reports a feeling of heaviness the lies over the center of the chest and she felt slightly short of breath for about 5 minutes.  No nausea or vomiting. No radiation of pain. Denies any previous heart problems. No history of hypertension or high cholesterol. Does not smoke. No abdominal pain. No nausea or vomiting. No recent trips or travel. No recent surgeries. Does not take any estrogen. No leg swelling. No history of blood clots. Previous cholecystectomy.  Does report her daughter was married yesterday. The event well. She felt fine yesterday throughout the event.  Reports pain is currently mild. CV somewhat better.   Past Medical History:  Diagnosis Date  . Hiatal hernia   . Obesity (BMI 30-39.9)   . Reflux   . Thyroid disease     Patient Active Problem List   Diagnosis Date Noted  . Pain in the chest   . Abnormal cardiac function test 02/26/2016  . Obesity (BMI 30-39.9)   . Angina pectoris (HCC) 02/25/2016  . Dyspnea 02/25/2016  . Hypothyroidism 02/25/2016  . Chest pain 09/07/2015    Past Surgical History:  Procedure Laterality Date  . CARDIAC CATHETERIZATION  02/27/2016   Procedure: Left Heart Cath and Coronary Angiography;  Surgeon: Corky Crafts, MD;  Location: [facility] INVASIVE CV LAB;  Service: Cardiovascular;;  . CESAREAN SECTION    . CHOLECYSTECTOMY    . THYROIDECTOMY    . THYROIDECTOMY      Prior to Admission medications   Medication Sig Start Date End Date Taking? Authorizing Provider  amoxicillin (AMOXIL) 500 MG capsule Take 1 capsule (500 mg total) by mouth 3  (three) times daily. Patient not taking: Reported on 02/23/2017 12/26/16   Joni Reining, PA-C  ibuprofen (ADVIL,MOTRIN) 600 MG tablet Take 1 tablet (600 mg total) by mouth every 8 (eight) hours as needed. Patient not taking: Reported on 02/23/2017 12/26/16   Joni Reining, PA-C  levothyroxine (SYNTHROID, LEVOTHROID) 137 MCG tablet Take 137 mcg by mouth daily before breakfast.    [provider]  lidocaine (XYLOCAINE) 2 % solution Use as directed 5 mLs in the mouth or throat every 6 (six) hours as needed for mouth pain. Patient not taking: Reported on 02/23/2017 12/26/16   Joni Reining, PA-C  naproxen (NAPROSYN) 500 MG tablet Take 1 tablet (500 mg total) by mouth 2 (two) times daily with a meal. Patient not taking: Reported on 02/23/2017 10/24/16   Evon Slack, PA-C  omeprazole (PRILOSEC) 20 MG capsule Take 20 mg by mouth daily.    [provider]  ranitidine (ZANTAC) 150 MG tablet Take 1 tablet (150 mg total) by mouth 2 (two) times daily. 07/28/16 07/28/17  Willy Eddy, MD  traMADol (ULTRAM) 50 MG tablet Take 1 tablet (50 mg total) by mouth every 6 (six) hours as needed for moderate pain. 12/26/16   Joni Reining, PA-C    Allergies Patient has no known allergies.  Family History  Problem Relation Age of Onset  . Breast cancer Neg Hx     Social History Social History  Substance Use Topics  .  Smoking status: Never Smoker  . Smokeless tobacco: Never Used  . Alcohol use No    Review of Systems Constitutional: No fever/chills Eyes: No visual changes. ENT: No sore throat. Cardiovascular: See history of present illness Respiratory: Denies shortness of breath. Gastrointestinal: No abdominal pain.  No nausea, no vomiting.  No diarrhea.  No constipation. Genitourinary: Negative for dysuria. Musculoskeletal: Negative for back pain. Skin: Negative for rash. Neurological: Negative for headaches, focal weakness or  numbness.    ____________________________________________   PHYSICAL EXAM:  VITAL SIGNS: ED Triage Vitals  Enc Vitals Group     BP 02/23/17 0630 (!) 141/72     Pulse Rate 02/23/17 0630 72     Resp 02/23/17 0630 (!) 22     Temp 02/23/17 0630 97.9 F (36.6 C)     Temp Source 02/23/17 0630 Oral     SpO2 02/23/17 0630 100 %     Weight 02/23/17 0633 212 lb (96.2 kg)     Height 02/23/17 0633 5\' 4"  (1.626 m)     Head Circumference --      Peak Flow --      Pain Score 02/23/17 0630 9     Pain Loc --      Pain Edu? --      Excl. in GC? --     Constitutional: Alert and oriented. Well appearing and in no acute distress. Eyes: Conjunctivae are normal. Head: Atraumatic. Nose: No congestion/rhinnorhea. Mouth/Throat: Mucous membranes are moist. Neck: No stridor.   Cardiovascular: Normal rate, regular rhythm. Grossly normal heart sounds.  Good peripheral circulation. Respiratory: Normal respiratory effort.  No retractions. Lungs CTAB. Gastrointestinal: Soft and nontender. No distention. Musculoskeletal: No lower extremity tenderness nor edema. Neurologic:  Normal speech and language. No gross focal neurologic deficits are appreciated.  Skin:  Skin is warm, dry and intact. No rash noted. Psychiatric: Mood and affect are normal. Speech and behavior are normal.  ____________________________________________   LABS (all labs ordered are listed, but only abnormal results are displayed)  Labs Reviewed  BASIC METABOLIC PANEL - Abnormal; Notable for the following:       Result Value   Potassium 3.3 (*)    Glucose, Bld 113 (*)    All other components within normal limits  CBC - Abnormal; Notable for the following:    Hemoglobin 11.4 (*)    HCT 32.8 (*)    All other components within normal limits  HEPATIC FUNCTION PANEL - Abnormal; Notable for the following:    Alkaline Phosphatase 31 (*)    Bilirubin, Direct <0.1 (*)    All other components within normal limits  TROPONIN I   LIPASE, BLOOD  TROPONIN I   ____________________________________________  EKG  ED ECG REPORT I, Zeinab Rodwell, the attending physician, personally viewed and interpreted this ECG.  Date: 02/23/2017 EKG Time: 6:30 AM Rate: 75 Rhythm: normal sinus rhythm QRS Axis: normal Intervals: normal ST/T Wave abnormalities: normal Narrative Interpretation: unremarkable, except for maybe a slight LVH  ____________________________________________  RADIOLOGY  Dg Chest 2 View  Result Date: 02/23/2017 CLINICAL DATA:  Chest pain and shortness of breath. EXAM: CHEST  2 VIEW COMPARISON:  12/17/2016 FINDINGS: The heart size and mediastinal contours are within normal limits. Both lungs are clear. The visualized skeletal structures are unremarkable. IMPRESSION: No active cardiopulmonary disease. Electronically Signed   By: Myles RosenthalJohn  Stahl M.D.   On: 02/23/2017 07:25    ____________________________________________   PROCEDURES  Procedure(s) performed: None  Procedures  Critical Care performed: No  ____________________________________________   INITIAL IMPRESSION / ASSESSMENT AND PLAN / ED COURSE  Pertinent labs & imaging results that were available during my care of the patient were reviewed by me and considered in my medical decision making (see chart for details).  Chest pain. No associated systemic symptoms. No abdominal pain. No risk factors for pulmonary embolism. No pleuritic pain. The patient is low risk by heart score. Symptoms of midsternal chest pressure somewhat typical, but EKG reassuring with normal troponin. No radiating pain. Does not have an exertional component. Patient had a previous cardiac catheterization within the last year.     ----------------------------------------- 8:00 AM on 02/23/2017 -----------------------------------------   OBSERVATION CARE: This patient is being placed under observation care for the following reasons: Chest pain with repeat testing to  rule out ischemia  Patient resting comfortably. Placed into chest pain observation for repeat troponin. Review of records she has had a normal angiography of the coronaries approximately one year ago.  ----------------------------------------- 8:49 AM on 02/23/2017 -----------------------------------------  Pain completely relieved at this time. Blood pressure did drop some and she feels slightly nauseated after nitroglycerin. We'll give a small fluid bolus and withhold further nitroglycerin. No chest pain.  ____________________________________________  ----------------------------------------- 8:50 AM on 02/23/2017 -----------------------------------------   END OF OBSERVATION STATUS: After an appropriate period of observation, this patient is being discharged due to the following reason(s): Patient pain free. Second troponin also normal. She feels much improved.  Return precautions and treatment recommendations and follow-up discussed with the patient who is agreeable with the plan.    FINAL CLINICAL IMPRESSION(S) / ED DIAGNOSES  Final diagnoses:  Chest pain with low risk of acute coronary syndrome      NEW MEDICATIONS STARTED DURING THIS VISIT:  New Prescriptions   No medications on file     Note:  This document was prepared using Dragon voice recognition software and may include unintentional dictation errors.     Sharyn Creamer, MD 02/23/17 [phone]

## 2017-02-23 NOTE — ED Notes (Signed)
This RN to bedside at this time. This RN introduced self to patient, and apologized for delay in coming to patient room. Pt states understanding. Medication administered per MD order. Pt is visualized in NAD at this time, respirations even and unlabored, skin, warm, dry, and intact. Pt is pleasant and cooperative at this time. Denies any needs.

## 2017-02-23 NOTE — ED Notes (Signed)
Repeat troponin collected at this time. Pt denies any needs. Will continued to monitor for further patient needs.

## 2017-02-23 NOTE — ED Notes (Signed)
This RN to bedside at this time. Pt denies any needs, family remains at bedside, explained in approx 20 mins would return and redraw troponin. Pt states understanding. VSS remain stable at this time. Will continue to monitor for further patient needs.

## 2017-02-23 NOTE — ED Notes (Signed)
MD notified of patient's BP and holding 3rd dose of Nitro, VORB for 500cc NS bolus. Explained to patient, pt states understanding, family now at bedside at this time.

## 2017-02-23 NOTE — ED Notes (Signed)
NAD noted at time of D/C. Pt denies questions or concerns. Pt ambulatory to the lobby at this time. Pt refused wheelchair to the lobby.  

## 2017-02-23 NOTE — ED Notes (Signed)
Patient transported to X-ray 

## 2017-02-23 NOTE — ED Triage Notes (Signed)
Pt to triage via WC, report central CP described as tightness, w/ sob, radiating to back.  Pt w/ mildly labored breathing at this time.  Denies cardiac hx and risk factors.

## 2017-02-23 NOTE — Discharge Instructions (Signed)

## 2017-02-23 NOTE — ED Notes (Signed)
Pt. States she was awaken by sudden onset chest pain that radiated to back.  Pt. States previous episodes in the past, but not to this degree.

## 2017-02-23 NOTE — ED Notes (Signed)
Pt denies chest pain after 2nd dose of nitro, pt c/o nausea and HA at this time, explained gave Tylenol. Pt's BP 90/43 upon recheck. Pt placed in slight Trendelenburg at this time.

## 2017-03-11 ENCOUNTER — Encounter (HOSPITAL_COMMUNITY): Payer: Self-pay

## 2017-03-11 ENCOUNTER — Encounter: Payer: Self-pay | Admitting: Emergency Medicine

## 2017-03-11 ENCOUNTER — Emergency Department: Payer: Self-pay

## 2017-03-11 ENCOUNTER — Emergency Department
Admission: EM | Admit: 2017-03-11 | Discharge: 2017-03-11 | Disposition: A | Discharge status: Home / Self Care | Payer: Self-pay | Attending: Emergency Medicine | Admitting: Emergency Medicine

## 2017-03-11 ENCOUNTER — Emergency Department (HOSPITAL_COMMUNITY)
Admission: EM | Admit: 2017-03-11 | Discharge: 2017-03-12 | Disposition: A | Discharge status: Home / Self Care | Payer: Self-pay | Attending: Emergency Medicine | Admitting: Emergency Medicine

## 2017-03-11 ENCOUNTER — Emergency Department (HOSPITAL_COMMUNITY): Payer: Self-pay

## 2017-03-11 DIAGNOSIS — E039 Hypothyroidism, unspecified: Secondary | ICD-10-CM | POA: Insufficient documentation

## 2017-03-11 DIAGNOSIS — R079 Chest pain, unspecified: Secondary | ICD-10-CM | POA: Insufficient documentation

## 2017-03-11 DIAGNOSIS — Z5321 Procedure and treatment not carried out due to patient leaving prior to being seen by health care provider: Secondary | ICD-10-CM | POA: Insufficient documentation

## 2017-03-11 DIAGNOSIS — Z79899 Other long term (current) drug therapy: Secondary | ICD-10-CM | POA: Insufficient documentation

## 2017-03-11 DIAGNOSIS — R0789 Other chest pain: Secondary | ICD-10-CM | POA: Insufficient documentation

## 2017-03-11 LAB — BASIC METABOLIC PANEL
Anion gap: 11 (ref 5–15)
Anion gap: 9 (ref 5–15)
BUN: 11 mg/dL (ref 6–20)
BUN: 10 mg/dL (ref 6–20)
CO2: 21 mmol/L — ABNORMAL LOW (ref 22–32)
CO2: 23 mmol/L (ref 22–32)
Calcium: 9.2 mg/dL (ref 8.9–10.3)
Calcium: 9.2 mg/dL (ref 8.9–10.3)
Chloride: 107 mmol/L (ref 101–111)
Chloride: 105 mmol/L (ref 101–111)
Creatinine, Ser: 0.74 mg/dL (ref 0.44–1.00)
Creatinine, Ser: 0.85 mg/dL (ref 0.44–1.00)
GFR calc Af Amer: >60 mL/min (ref >60)
GFR calc Af Amer: >60 mL/min (ref >60)
GFR calc non Af Amer: >60 mL/min (ref >60)
GFR calc non Af Amer: >60 mL/min (ref >60)
Glucose, Bld: 108 mg/dL — ABNORMAL HIGH (ref 65–99)
Glucose, Bld: 103 mg/dL — ABNORMAL HIGH (ref 65–99)
Potassium: 3.7 mmol/L (ref 3.5–5.1)
Potassium: 3.6 mmol/L (ref 3.5–5.1)
Sodium: 139 mmol/L (ref 135–145)
Sodium: 137 mmol/L (ref 135–145)

## 2017-03-11 LAB — CBC
HCT: 33.9 % — ABNORMAL LOW (ref 35.0–47.0)
HCT: 33.7 % — ABNORMAL LOW (ref 36.0–46.0)
Hemoglobin: 11.5 g/dL — ABNORMAL LOW (ref 12.0–16.0)
Hemoglobin: 11.1 g/dL — ABNORMAL LOW (ref 12.0–15.0)
MCH: 27.8 pg (ref 26.0–34.0)
MCH: 27.5 pg (ref 26.0–34.0)
MCHC: 33.9 g/dL (ref 32.0–36.0)
MCHC: 32.9 g/dL (ref 30.0–36.0)
MCV: 82 fL (ref 80.0–100.0)
MCV: 83.6 fL (ref 78.0–100.0)
Platelets: 334 10*3/uL (ref 150–440)
Platelets: 335 10*3/uL (ref 150–400)
RBC: 4.14 MIL/uL (ref 3.80–5.20)
RBC: 4.03 MIL/uL (ref 3.87–5.11)
RDW: 14.6 % — ABNORMAL HIGH (ref 11.5–14.5)
RDW: 13.7 % (ref 11.5–15.5)
WBC: 5.5 10*3/uL (ref 3.6–11.0)
WBC: 5.7 10*3/uL (ref 4.0–10.5)

## 2017-03-11 LAB — I-STAT TROPONIN, ED: Troponin i, poc: 0 ng/mL (ref 0.00–0.08)

## 2017-03-11 LAB — TROPONIN I: Troponin I: <0.03 ng/mL (ref <0.03)

## 2017-03-11 IMAGING — CR DG CHEST 2V
1 series · 2 of 2 positions shown · non-contrast
Comparison: Radiographs [DATE] and [DATE])

CLINICAL DATA: Central chest pain today with shortness of breath,
dizziness and lightheadedness.

EXAM:
CHEST  2 VIEW

[Series 1: dg chest 2 view · 0.14mm/px · 2 of 2 slices shown]
[im 1/2]
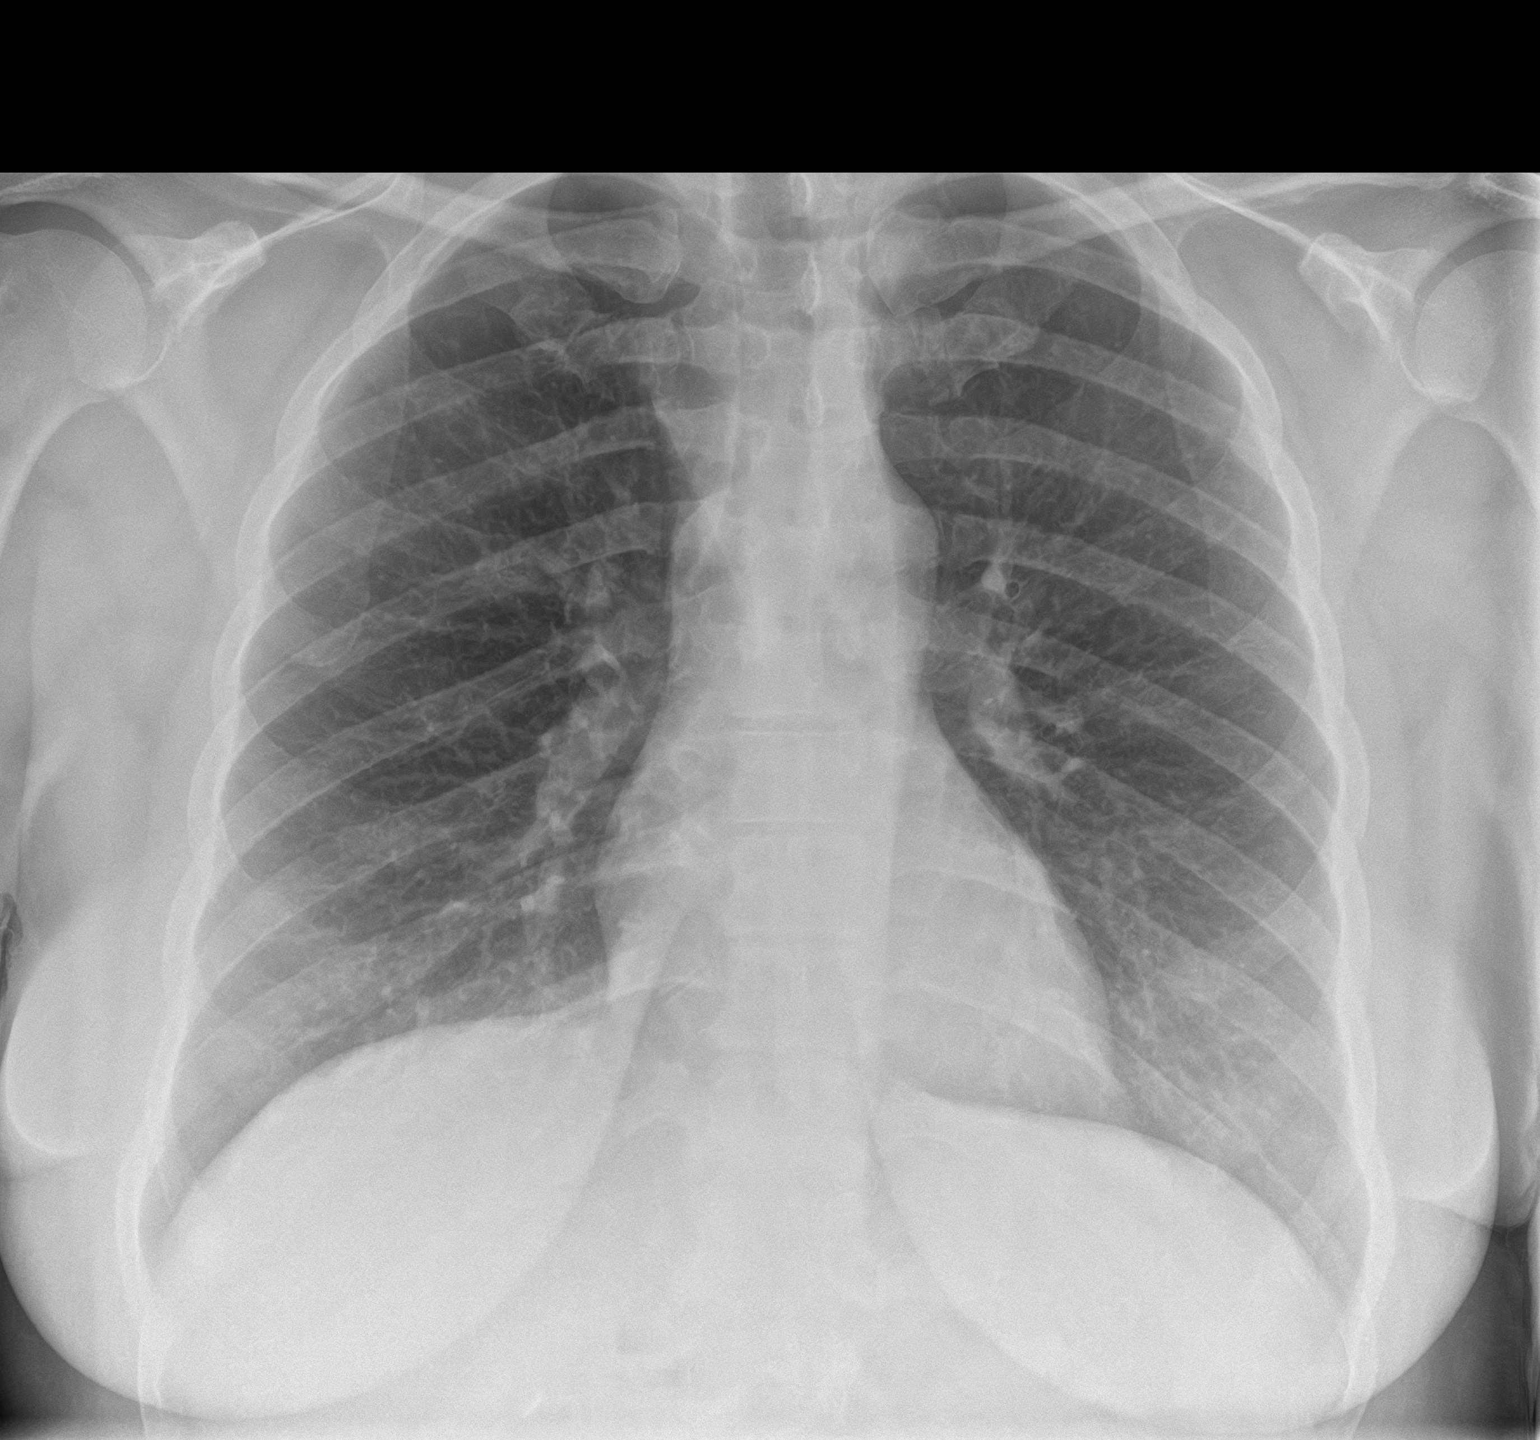
[im 2/2]
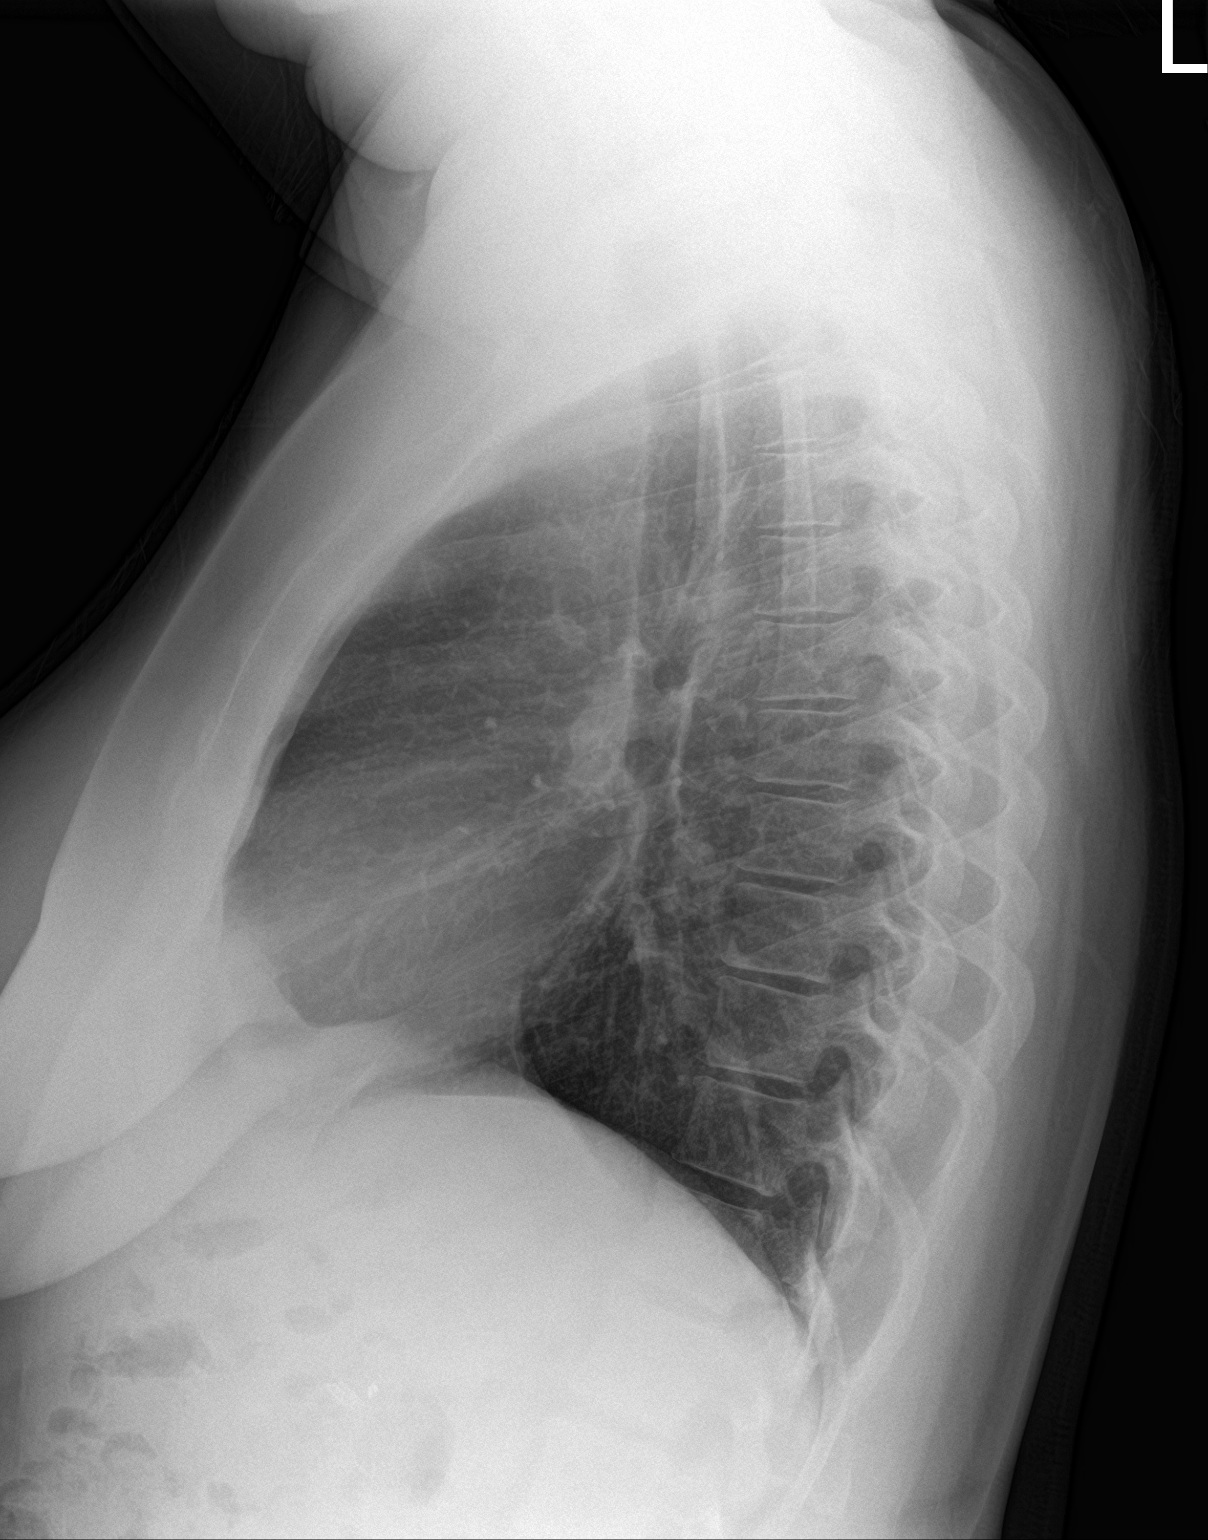

[2 of 2 positions shown; findings below may reference images not displayed]

FINDINGS: The heart size and mediastinal contours are normal. The lungs are
clear. There is no pleural effusion or pneumothorax. No acute
osseous findings are identified.
IMPRESSION: Stable chest.  No active cardiopulmonary process.

## 2017-03-11 IMAGING — DX DG CHEST 2V
2 series · 2 of 2 positions shown · non-contrast
Comparison: [DATE] chest radiograph

CLINICAL DATA: 50 y/o  F; chest pain.

EXAM:
CHEST  2 VIEW

[chest pa]
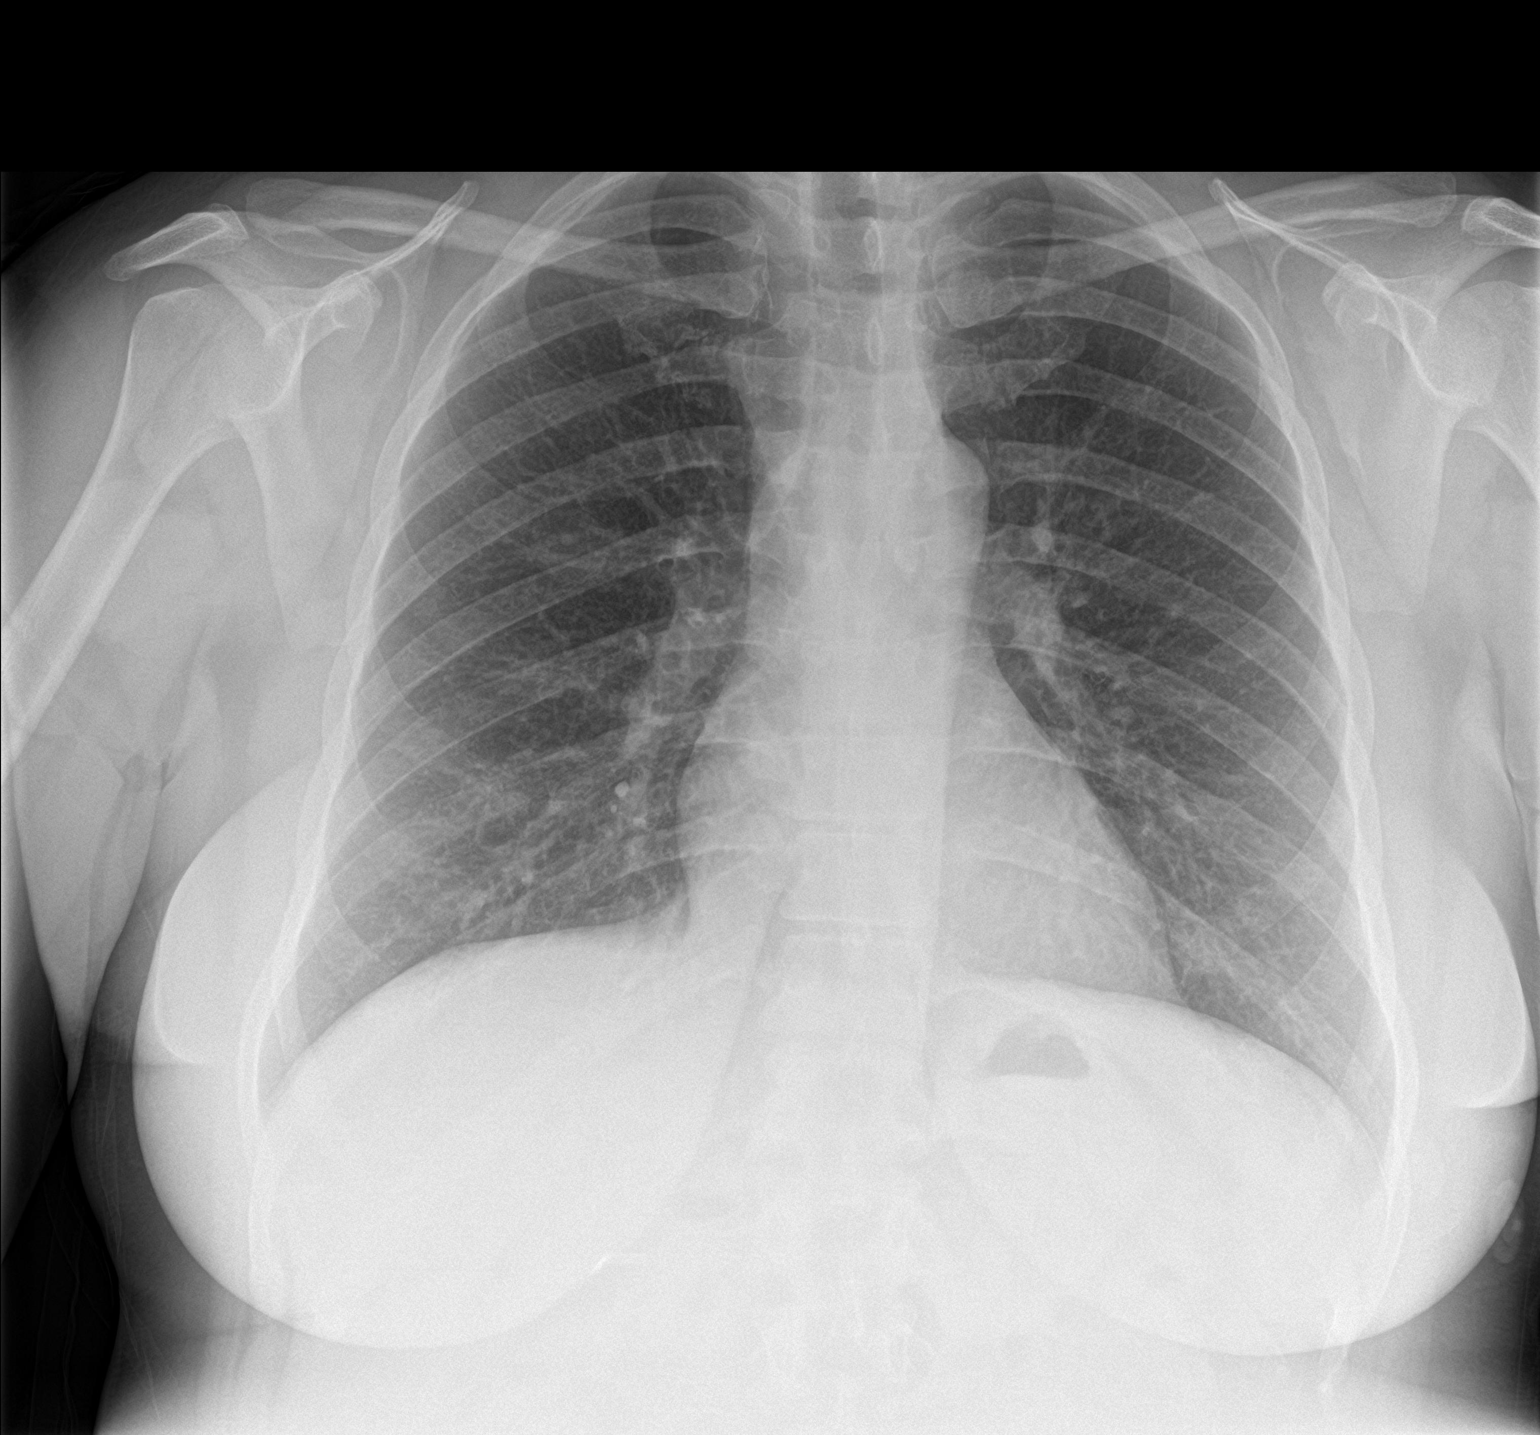

[chest lat]
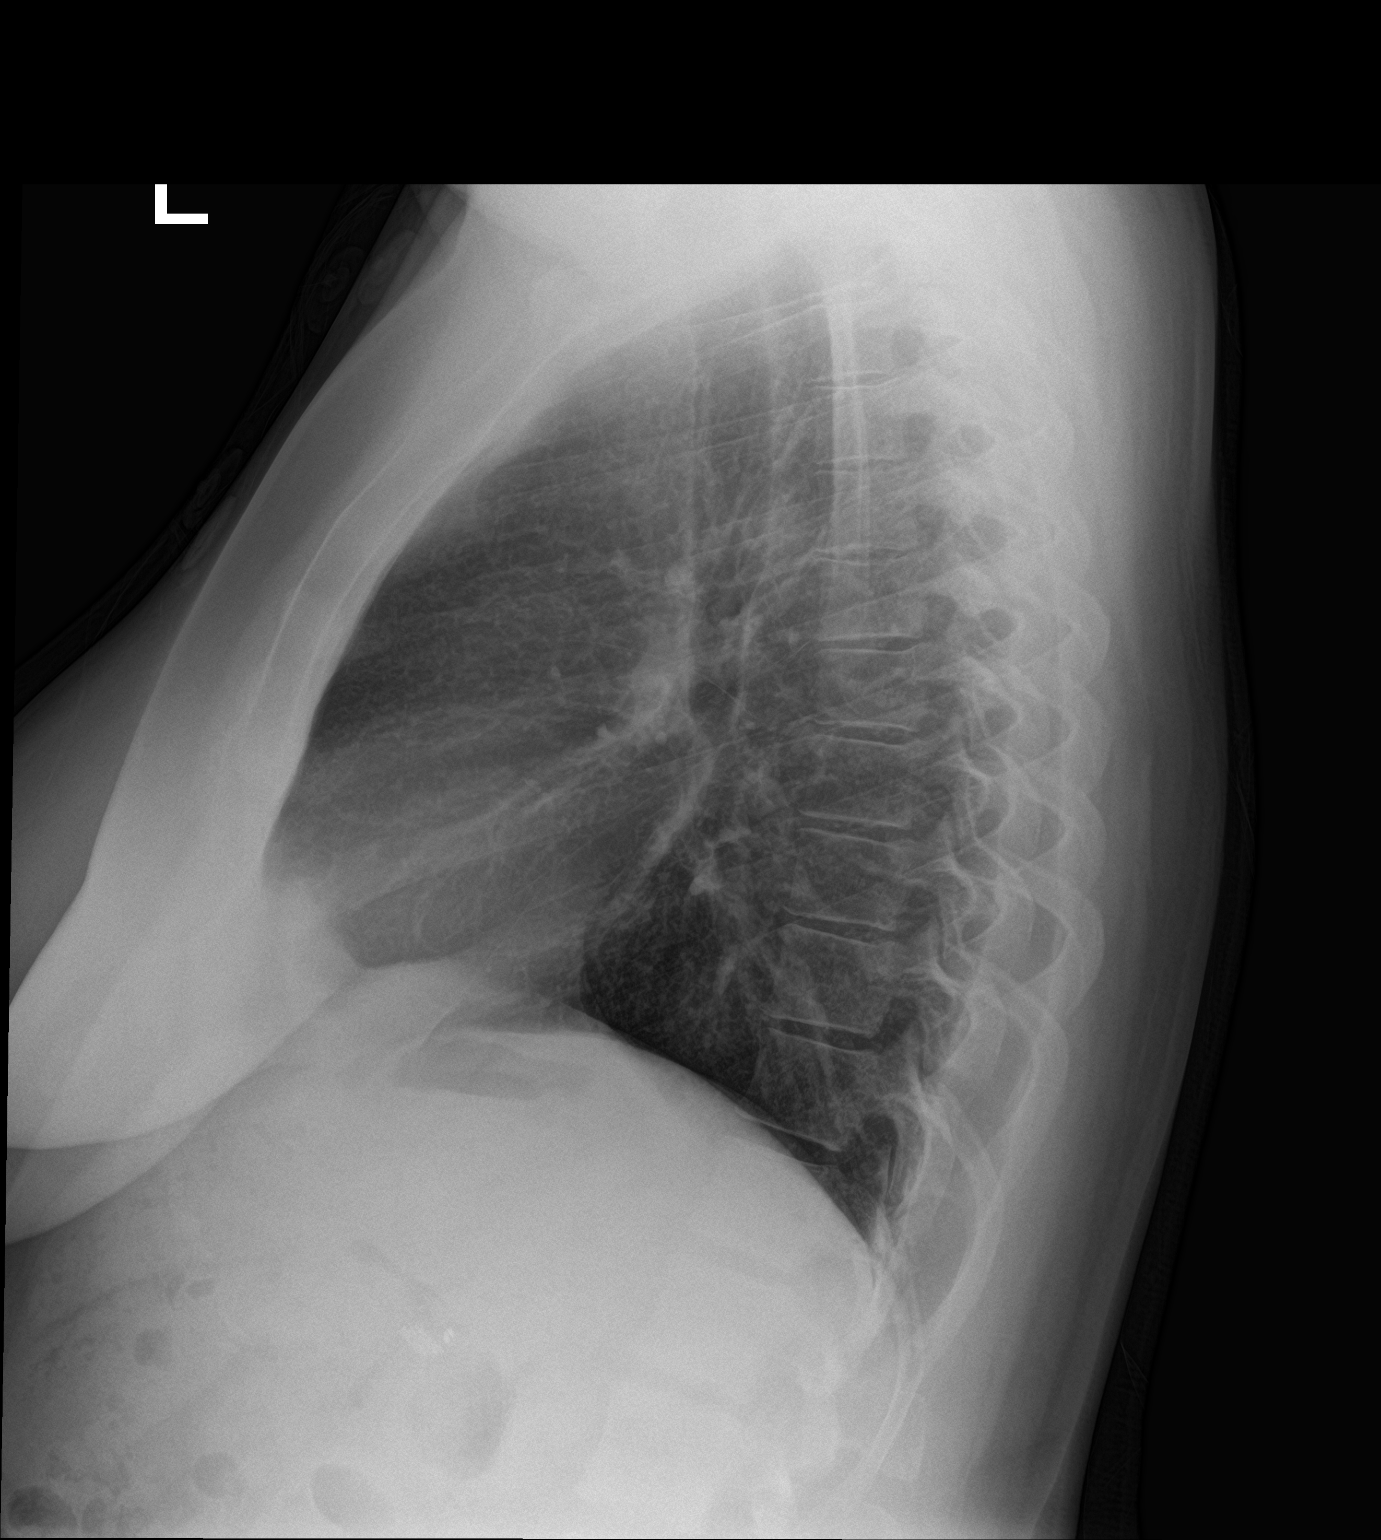

[2 of 2 positions shown; findings below may reference images not displayed]

FINDINGS: Stable heart size and mediastinal contours are within normal limits.
Both lungs are clear. The visualized skeletal structures are
unremarkable.
IMPRESSION: No active cardiopulmonary disease.

By: REJI M.D.

## 2017-03-11 NOTE — ED Triage Notes (Signed)
Pt. Has occasional chest pain, but today it has been severe.  [facility] to [facility] and [facility] treated pt.  And sent her home .  She was on her way to church and the lt. Side chest pain came back severe and this time it radiated into her lt. Jaw and lt. Arm .  Pt. Skin was diaphoretic and pale when she arrived.   Alert and oriented X3.  No neuro deficits noted. ECG completedx in triage

## 2017-03-11 NOTE — ED Triage Notes (Signed)
Pt reports central CP that began today with SOB, dizziness, lightheadedness, nausea and back pain. Pt also reports left toes tingling and tongue numbness. Pt tearful in triage. Pt seen here 02/09/17 for the same symptoms.

## 2017-03-11 NOTE — ED Provider Notes (Signed)
[facility]-EMERGENCY DEPT Provider Note   CSN: [id number] Arrival date & time: 03/11/17  1847  By signing my name below, I, Rosario Adie, attest that this documentation has been prepared under the direction and in the presence of [facility], PA-C.  Electronically Signed: Rosario Adie, ED Scribe. 03/11/17. 11:41 PM.  History   Chief Complaint Chief Complaint  Patient presents with  . Chest Pain   The history is provided by the patient and medical records. No language interpreter was used.    HPI Comments: Morgan Stevens is a 51 y.o. female with a PMHx of hiatal hernia, obesity, GERD, and hypothyroidism, who presents to the Emergency Department complaining of intermittent episodes of left-sided chest pain beginning this morning. Pt reports that since this morning that she has been experiencing intermittent episodes of left-sided chest pain. She was seen in [facility] ED today for this issue; she was discharged at that time with benign workup including Troponin, CBC, CMP, EKG and CXR. Following this she had a more severe episode of pain, prompting her to return to the ED. She reports associated shortness of breath and nausea today secondary to her episodes of pain, and calf pain beginning yesterday as well. Additionally, she reports that she was sick with URI-like symptoms one week ago and she has had an ongoing non-productive cough since. No hemoptysis. She states that her episodes of pain will last for approximately 20 minutes each time and she has had three episodes of pain today. No h/o similar. She reports that her pain is worse with generalized movement. Of note, pt has been seen in the ED for recurrent chest pain several times over the past year. Per prior chart review, pt underwent cardiac catheterization on 02/26/17 which showed no evidence of CAD. No h/o PE/DVT, recent long travel, surgery, fracture, prolonged immobilization, or exogenous estrogen usage. No h/o DM, HTN,  HLD. No FHx of MI. She is a non-smoker and does not use illicit drugs. She denies diaphoresis, vomiting, leg swelling, fever, abdominal pain, sensation of palpitations, or any other associated symptoms.   Past Medical History:  Diagnosis Date  . Hiatal hernia   . Obesity (BMI 30-39.9)   . Reflux   . Thyroid disease    Patient Active Problem List   Diagnosis Date Noted  . Pain in the chest   . Abnormal cardiac function test 02/26/2016  . Obesity (BMI 30-39.9)   . Angina pectoris (HCC) 02/25/2016  . Dyspnea 02/25/2016  . Hypothyroidism 02/25/2016  . Chest pain 09/07/2015   Past Surgical History:  Procedure Laterality Date  . CARDIAC CATHETERIZATION  02/27/2016   Procedure: Left Heart Cath and Coronary Angiography;  Surgeon: Corky Crafts, MD;  Location: [facility] INVASIVE CV LAB;  Service: Cardiovascular;;  . CESAREAN SECTION    . CHOLECYSTECTOMY    . THYROIDECTOMY    . THYROIDECTOMY     OB History    No data available     Home Medications    Prior to Admission medications   Medication Sig Start Date End Date Taking? Authorizing Provider  levothyroxine (SYNTHROID, LEVOTHROID) 137 MCG tablet Take 137 mcg by mouth daily before breakfast.   Yes [provider]  omeprazole (PRILOSEC) 20 MG capsule Take 20 mg by mouth 2 (two) times daily before a meal.    Yes [provider]  ranitidine (ZANTAC) 150 MG tablet Take 1 tablet (150 mg total) by mouth 2 (two) times daily. 07/28/16 07/28/17 Yes Willy Eddy, MD  amoxicillin (  AMOXIL) 500 MG capsule Take 1 capsule (500 mg total) by mouth 3 (three) times daily. Patient not taking: Reported on 02/23/2017 12/26/16   Joni Reining, PA-C  ibuprofen (ADVIL,MOTRIN) 600 MG tablet Take 1 tablet (600 mg total) by mouth every 8 (eight) hours as needed. Patient not taking: Reported on 02/23/2017 12/26/16   Joni Reining, PA-C  lidocaine (XYLOCAINE) 2 % solution Use as directed 5 mLs in the mouth or throat every 6 (six) hours as needed  for mouth pain. Patient not taking: Reported on 02/23/2017 12/26/16   Joni Reining, PA-C  naproxen (NAPROSYN) 500 MG tablet Take 1 tablet (500 mg total) by mouth 2 (two) times daily with a meal. Patient not taking: Reported on 02/23/2017 10/24/16   Evon Slack, PA-C  traMADol (ULTRAM) 50 MG tablet Take 1 tablet (50 mg total) by mouth every 6 (six) hours as needed for moderate pain. Patient not taking: Reported on 03/11/2017 12/26/16   Joni Reining, PA-C   Family History Family History  Problem Relation Age of Onset  . Breast cancer Neg Hx    Social History Social History  Substance Use Topics  . Smoking status: Never Smoker  . Smokeless tobacco: Never Used  . Alcohol use No   Allergies   Patient has no known allergies.   Review of Systems Review of Systems  A complete review of systems was obtained and all systems are negative except as noted in the HPI and PMH.   Physical Exam Updated Vital Signs BP 126/72   Pulse 67   Temp (!) 97.4 F (36.3 C) (Oral)   Resp 18   Ht 5\' 4"  (1.626 m)   Wt 95.3 kg (210 lb)   LMP 03/04/2017 (Exact Date)   SpO2 100%   BMI 36.05 kg/m   Physical Exam  Constitutional: She is oriented to person, place, and time. She appears well-developed and well-nourished. No distress.  HENT:  Head: Normocephalic and atraumatic.  Mouth/Throat: Oropharynx is clear and moist.  Eyes: Conjunctivae are normal.  Neck: Normal range of motion. No JVD present. No tracheal deviation present.  Cardiovascular: Normal rate, regular rhythm and intact distal pulses.   Radial pulse equal bilaterally  Pulmonary/Chest: Effort normal and breath sounds normal. No stridor. No respiratory distress. She has no wheezes. She has no rales.     She exhibits no tenderness.  Abdominal: Soft. She exhibits no distension and no mass. There is no tenderness. There is no rebound and no guarding.  Musculoskeletal: Normal range of motion. She exhibits tenderness. She exhibits no  edema.  No calf asymmetry, superficial collaterals, palpable cords, edema, Homans sign negative bilaterally.    Neurological: She is alert and oriented to person, place, and time.  Skin: Skin is warm. She is not diaphoretic. No pallor.  Psychiatric: She has a normal mood and affect. Her behavior is normal.  Nursing note and vitals reviewed.  ED Treatments / Results  DIAGNOSTIC STUDIES: Oxygen Saturation is 100% on RA, normal by my interpretation.   COORDINATION OF CARE: 11:41 PM-Discussed next steps with pt. Pt verbalized understanding and is agreeable with the plan.   Labs (all labs ordered are listed, but only abnormal results are displayed) Labs Reviewed  BASIC METABOLIC PANEL - Abnormal; Notable for the following:       Result Value   Glucose, Bld 103 (*)    All other components within normal limits  CBC - Abnormal; Notable for the following:    Hemoglobin  11.1 (*)    HCT 33.7 (*)    All other components within normal limits  D-DIMER, QUANTITATIVE (NOT AT [facility])  I-STAT TROPOININ, ED  Rosezena SensorI-STAT TROPOININ, ED   EKG  EKG Interpretation None      Radiology Dg Chest 2 View  Result Date: 03/11/2017 CLINICAL DATA:  51 y/o  F; chest pain. EXAM: CHEST  2 VIEW COMPARISON:  03/11/2017 chest radiograph FINDINGS: Stable heart size and mediastinal contours are within normal limits. Both lungs are clear. The visualized skeletal structures are unremarkable. IMPRESSION: No active cardiopulmonary disease. Electronically Signed   By: Mitzi HansenLance  Furusawa-Stratton M.D.   On: 03/11/2017 19:41   Dg Chest 2 View  Result Date: 03/11/2017 CLINICAL DATA:  Central chest pain today with shortness of breath, dizziness and lightheadedness. EXAM: CHEST  2 VIEW COMPARISON:  Radiographs 02/23/2017 and 12/18/2006) FINDINGS: The heart size and mediastinal contours are normal. The lungs are clear. There is no pleural effusion or pneumothorax. No acute osseous findings are identified. IMPRESSION: Stable chest.   No active cardiopulmonary process. Electronically Signed   By: Carey BullocksWilliam  Veazey M.D.   On: 03/11/2017 14:20   Procedures Procedures   Medications Ordered in ED Medications - No data to display  Initial Impression / Assessment and Plan / ED Course  I have reviewed the triage vital signs and the nursing notes.  Pertinent labs & imaging results that were available during my care of the patient were reviewed by me and considered in my medical decision making (see chart for details).     Vitals:   03/12/17 0045 03/12/17 0100 03/12/17 0115 03/12/17 0130  BP: 127/72 121/78 125/74 126/72  Pulse: 66 69 69 67  Resp: 17 17 17 18   Temp:      TempSrc:      SpO2: 98% 100% 98% 100%  Weight:      Height:        Medications - No data to display  Morgan Stevens is 51 y.o. female presenting with Chest pain onset this morning. Patient had clean cath in the last year. EKG unremarkable, troponin negative, d-dimer negative, chest x-ray unremarkable. Will check a delta troponin that this patient is low risk by heart score with a negative clean cath, I doubt dissection.  Second troponin negative. Advised patient to follow closely with primary care.  Evaluation does not show pathology that would require ongoing emergent intervention or inpatient treatment. Pt is hemodynamically stable and mentating appropriately. Discussed findings and plan with patient/guardian, who agrees with care plan. All questions answered. Return precautions discussed and outpatient follow up given.      Final Clinical Impressions(s) / ED Diagnoses   Final diagnoses:  Chest wall pain   New Prescriptions Discharge Medication List as of 03/12/2017  1:12 AM     I personally performed the services described in this documentation, which was scribed in my presence. The recorded information has been reviewed and is accurate.     Kaylyn Limisciotta, Shantera Monts, PA-C 03/12/17 [id number]    Shon BatonHorton, Courtney F, MD 03/17/17 [id number]

## 2017-03-11 NOTE — ED Notes (Signed)
ED Provider at bedside. 

## 2017-03-12 LAB — D-DIMER, QUANTITATIVE: D-Dimer, Quant: 0.36 ug/mL-FEU (ref 0.00–0.50)

## 2017-03-12 LAB — I-STAT TROPONIN, ED: Troponin i, poc: 0 ng/mL (ref 0.00–0.08)

## 2017-03-12 NOTE — Discharge Instructions (Signed)
For pain control please take ibuprofen (also known as Motrin or Advil) 800mg (this is normally 4 over the counter pills) 3 times a day  for 5 days. Take with food to minimize stomach irritation. ° ° °Please follow with your primary care doctor in the next 2 days for a check-up. They must obtain records for further management.  ° °Do not hesitate to return to the Emergency Department for any new, worsening or concerning symptoms.  ° °

## 2017-03-12 NOTE — ED Notes (Signed)
Pt stable, ambulatory, states understanding of discharge instructions 

## 2017-03-16 ENCOUNTER — Emergency Department: Payer: Self-pay

## 2017-03-16 ENCOUNTER — Emergency Department
Admission: EM | Admit: 2017-03-16 | Discharge: 2017-03-16 | Disposition: A | Discharge status: Home / Self Care | Payer: Self-pay | Attending: Emergency Medicine | Admitting: Emergency Medicine

## 2017-03-16 DIAGNOSIS — M792 Neuralgia and neuritis, unspecified: Secondary | ICD-10-CM | POA: Insufficient documentation

## 2017-03-16 DIAGNOSIS — Z79899 Other long term (current) drug therapy: Secondary | ICD-10-CM | POA: Insufficient documentation

## 2017-03-16 DIAGNOSIS — M79605 Pain in left leg: Secondary | ICD-10-CM | POA: Insufficient documentation

## 2017-03-16 DIAGNOSIS — E079 Disorder of thyroid, unspecified: Secondary | ICD-10-CM | POA: Insufficient documentation

## 2017-03-16 MED ORDER — GABAPENTIN 300 MG PO CAPS: 300.0000 mg | ORAL_CAPSULE | Freq: Three times a day (TID) | ORAL | 0 refills | Status: DC

## 2017-03-16 MED ORDER — PREDNISONE 20 MG PO TABS
40.0000 mg | ORAL_TABLET | Freq: Every day | ORAL | 0 refills | Status: AC
Start: 2017-03-16 — End: 2017-03-21

## 2017-03-16 MED ORDER — OXYCODONE-ACETAMINOPHEN 5-325 MG PO TABS
ORAL_TABLET | ORAL | Status: AC
  Filled 2017-03-16: qty 1

## 2017-03-16 MED ORDER — OXYCODONE-ACETAMINOPHEN 5-325 MG PO TABS
1.0000 | ORAL_TABLET | ORAL | Status: DC | PRN
  Administered 2017-03-16: 1 via ORAL

## 2017-03-16 NOTE — ED Triage Notes (Signed)
Patient c/o left leg pain beginning Thursday. Pt reports pain radiates through calf, thigh and hip. Pt denies pain as burning. Pt denies injury, long car trips, plane flights. No visible swelling to leg, no areas of warmth/redness.

## 2017-03-16 NOTE — Discharge Instructions (Signed)
Your workup in the Emergency Department today was reassuring.  We did not find any specific abnormalities.  We recommend you drink plenty of fluids, take your regular medications and/or any new ones prescribed today, and follow up with the doctor(s) listed in these documents as recommended.  Return to the Emergency Department if you develop new or worsening symptoms that concern you.  

## 2017-03-16 NOTE — ED Notes (Signed)
Pt in ultrasound

## 2017-03-16 NOTE — ED Provider Notes (Signed)
[facility] Emergency Department Provider Note  ____________________________________________   First MD Initiated Contact with Patient 03/16/17 0542     (approximate)  I have reviewed the triage vital signs and the nursing notes.   HISTORY  Chief Complaint Leg Pain (left)    HPI Morgan Stevens is a 51 y.o. female with medical history as listed below who presents for evaluation of acute onset left leg pain.  She had a normal day and is in a normal state of health and reports that this evening, a few hours ago, she started having dull and aching but severe pain that radiates throughout her left lower extremity from the groin and thigh down to her foot.  It is accompanied with some tingling.  She has no weakness or loss of sensation, just the tingling and aching pain.  The pain does not start in her buttocks or her back.  She has experienced no trauma or injury.  She has not been exerting herself and does not think she pulled any muscles.  She has no swelling of which she is aware and no redness or rash.  She denies fever/chills, chest pain, shortness of breath, nausea, vomiting, urinary retention, urinary incontinence, dysuria, or difficulty with bowel movements.  She has not had similar issues in the past.   Past Medical History:  Diagnosis Date  . Hiatal hernia   . Obesity (BMI 30-39.9)   . Reflux   . Thyroid disease     Patient Active Problem List   Diagnosis Date Noted  . Pain in the chest   . Abnormal cardiac function test 02/26/2016  . Obesity (BMI 30-39.9)   . Angina pectoris (HCC) 02/25/2016  . Dyspnea 02/25/2016  . Hypothyroidism 02/25/2016  . Chest pain 09/07/2015    Past Surgical History:  Procedure Laterality Date  . CARDIAC CATHETERIZATION  02/27/2016   Procedure: Left Heart Cath and Coronary Angiography;  Surgeon: Corky Crafts, MD;  Location: [facility] INVASIVE CV LAB;  Service: Cardiovascular;;  . CESAREAN SECTION    . CHOLECYSTECTOMY     . THYROIDECTOMY    . THYROIDECTOMY      Prior to Admission medications   Medication Sig Start Date End Date Taking? Authorizing Provider  amoxicillin (AMOXIL) 500 MG capsule Take 1 capsule (500 mg total) by mouth 3 (three) times daily. Patient not taking: Reported on 02/23/2017 12/26/16   Joni Reining, PA-C  gabapentin (NEURONTIN) 300 MG capsule Take 1 capsule (300 mg total) by mouth 3 (three) times daily. 03/16/17   Loleta Rose, MD  ibuprofen (ADVIL,MOTRIN) 600 MG tablet Take 1 tablet (600 mg total) by mouth every 8 (eight) hours as needed. Patient not taking: Reported on 02/23/2017 12/26/16   Joni Reining, PA-C  levothyroxine (SYNTHROID, LEVOTHROID) 137 MCG tablet Take 137 mcg by mouth daily before breakfast.    [provider]  lidocaine (XYLOCAINE) 2 % solution Use as directed 5 mLs in the mouth or throat every 6 (six) hours as needed for mouth pain. Patient not taking: Reported on 02/23/2017 12/26/16   Joni Reining, PA-C  naproxen (NAPROSYN) 500 MG tablet Take 1 tablet (500 mg total) by mouth 2 (two) times daily with a meal. Patient not taking: Reported on 02/23/2017 10/24/16   Evon Slack, PA-C  omeprazole (PRILOSEC) 20 MG capsule Take 20 mg by mouth 2 (two) times daily before a meal.     [provider]  predniSONE (DELTASONE) 20 MG tablet Take 2 tablets (40  mg total) by mouth daily. 03/16/17 03/21/17  Loleta Rose, MD  ranitidine (ZANTAC) 150 MG tablet Take 1 tablet (150 mg total) by mouth 2 (two) times daily. 07/28/16 07/28/17  Willy Eddy, MD  traMADol (ULTRAM) 50 MG tablet Take 1 tablet (50 mg total) by mouth every 6 (six) hours as needed for moderate pain. Patient not taking: Reported on 03/11/2017 12/26/16   Joni Reining, PA-C    Allergies Patient has no known allergies.  Family History  Problem Relation Age of Onset  . Breast cancer Neg Hx     Social History Social History  Substance Use Topics  . Smoking status: Never Smoker  . Smokeless  tobacco: Never Used  . Alcohol use No    Review of Systems Constitutional: No fever/chills Eyes: No visual changes. ENT: No sore throat. Cardiovascular: Denies chest pain. Respiratory: Denies shortness of breath. Gastrointestinal: No abdominal pain.  No nausea, no vomiting.  No diarrhea.  No constipation. Genitourinary: Negative for dysuria. Musculoskeletal: Pain in her left lower extremity from the groin and thigh down to her foot.  Negative for neck pain.  Negative for back pain.  Integumentary: Negative for rash. Neurological: Negative for headaches, focal weakness or numbness.  Some tingling sensation associated with the pain in her left lower leg   ____________________________________________   PHYSICAL EXAM:  VITAL SIGNS: ED Triage Vitals  Enc Vitals Group     BP 03/16/17 0209 (!) 146/82     Pulse Rate 03/16/17 0209 70     Resp 03/16/17 0209 15     Temp 03/16/17 0209 98.5 F (36.9 C)     Temp Source 03/16/17 0209 Oral     SpO2 03/16/17 0209 100 %     Weight 03/16/17 0209 94.8 kg (209 lb)     Height 03/16/17 0209 1.626 m (5\' 4" )     Head Circumference --      Peak Flow --      Pain Score 03/16/17 0210 7     Pain Loc --      Pain Edu? --      Excl. in GC? --     Constitutional: Alert and oriented. Well appearing and in no acute distress. Eyes: Conjunctivae are normal.  Head: Atraumatic. Nose: No congestion/rhinnorhea. Mouth/Throat: Mucous membranes are moist. Neck: No stridor.  No meningeal signs.   Cardiovascular: Normal rate, regular rhythm. Good peripheral circulation. Grossly normal heart sounds. Respiratory: Normal respiratory effort.  No retractions. Lungs CTAB. Gastrointestinal: Obese.  Soft and nontender. No distention.  Musculoskeletal: No lower extremity tenderness nor edema. No gross deformities of extremities.  No crepitus, no evidence of cellulitis, no tenderness to palpation of any particular spot on her lower extremities.  Distal pulses are easily  palpable and her feet are equally warm and well perfused.  No back pain or tenderness to palpation. Neurologic:  Normal speech and language. No gross focal neurologic deficits are appreciated; specifically no weakness in her lower extremities Skin:  Skin is warm, dry and intact. No rash noted. Psychiatric: Mood and affect are normal. Speech and behavior are normal.  ____________________________________________   LABS (all labs ordered are listed, but only abnormal results are displayed)  Labs Reviewed - No data to display ____________________________________________  EKG  None - EKG not ordered by ED physician ____________________________________________  RADIOLOGY   US Venous Img Lower Unilateral Left  Result Date: 03/16/2017 CLINICAL DATA:  Left lower extremity pain. EXAM: LEFT LOWER EXTREMITY VENOUS DOPPLER ULTRASOUND TECHNIQUE: Gray-scale sonography with  graded compression, as well as color Doppler and duplex ultrasound were performed to evaluate the lower extremity deep venous systems from the level of the common femoral vein and including the common femoral, femoral, profunda femoral, popliteal and calf veins including the posterior tibial, peroneal and gastrocnemius veins when visible. The superficial great saphenous vein was also interrogated. Spectral Doppler was utilized to evaluate flow at rest and with distal augmentation maneuvers in the common femoral, femoral and popliteal veins. COMPARISON:  Lower extremity duplex 04/11/2016, 08/28/2015 FINDINGS: Contralateral Common Femoral Vein: Respiratory phasicity is normal and symmetric with the symptomatic side. No evidence of thrombus. Normal compressibility. Common Femoral Vein: No evidence of thrombus. Normal compressibility, respiratory phasicity and response to augmentation. Saphenofemoral Junction: No evidence of thrombus. Normal compressibility and flow on color Doppler imaging. Profunda Femoral Vein: No evidence of thrombus.  Normal compressibility and flow on color Doppler imaging. Femoral Vein: No evidence of thrombus. Normal compressibility, respiratory phasicity and response to augmentation. Popliteal Vein: No evidence of thrombus. Normal compressibility, respiratory phasicity and response to augmentation. Calf Veins: No evidence of thrombus. Normal compressibility and flow on color Doppler imaging. Superficial Great Saphenous Vein: No evidence of thrombus. Normal compressibility and flow on color Doppler imaging. Venous Reflux:  None. Other Findings:  None. IMPRESSION: No evidence of DVT within the left lower extremity. Electronically Signed   By: Rubye Oaks M.D.   On: 03/16/2017 06:13    ____________________________________________   PROCEDURES  Critical Care performed: No   Procedure(s) performed:   Procedures   ____________________________________________   INITIAL IMPRESSION / ASSESSMENT AND PLAN / ED COURSE  Pertinent labs & imaging results that were available during my care of the patient were reviewed by me and considered in my medical decision making (see chart for details).  The patient has no evidence of DVT on ultrasound.  There is no indication for imaging as she has had no traumatic injuries.  She is able to walk but does report that it causes some pain.  Her signs and symptoms are most consistent with sciatica although I would expect it would start more in the buttocks or lower back.  However this would explain the pain and tingling sensation in the appropriate distribution.  There is no evidence of an acute or emergent medical condition including arterial occlusion, phlegmasia, DVT, compartment syndrome, etc.  I will treat empirically with gabapentin and a short burst of prednisone and encourage close follow up with her PCP.  Will also provide orthopedics follow up information.  She understands and agrees with the plan.      ____________________________________________  FINAL  CLINICAL IMPRESSION(S) / ED DIAGNOSES  Final diagnoses:  Left leg pain  Neuropathic pain     MEDICATIONS GIVEN DURING THIS VISIT:  Medications  oxyCODONE-acetaminophen (PERCOCET/ROXICET) 5-325 MG per tablet 1 tablet (1 tablet Oral Given 03/16/17 0216)  oxyCODONE-acetaminophen (PERCOCET/ROXICET) 5-325 MG per tablet (not administered)     NEW OUTPATIENT MEDICATIONS STARTED DURING THIS VISIT:  New Prescriptions   GABAPENTIN (NEURONTIN) 300 MG CAPSULE    Take 1 capsule (300 mg total) by mouth 3 (three) times daily.   PREDNISONE (DELTASONE) 20 MG TABLET    Take 2 tablets (40 mg total) by mouth daily.    Modified Medications   No medications on file    Discontinued Medications   No medications on file     Note:  This document was prepared using Dragon voice recognition software and may include unintentional dictation errors.    York Cerise,  Kandee Keenory, MD 03/16/17 [phone]

## 2017-03-16 NOTE — ED Notes (Signed)
Pt states left anterior leg pain from groin to mid thigh. Pt states pain is constant and independent of movement. Cms intact to all left toes. Pt without obvious swelling noted. Pt denies other symptoms.

## 2017-03-27 DIAGNOSIS — M5430 Sciatica, unspecified side: Secondary | ICD-10-CM | POA: Insufficient documentation

## 2017-04-20 IMAGING — US US EXTREM LOW VENOUS*L*
1 series · 13 of 24 positions shown · non-contrast
Comparison: None.

CLINICAL DATA: [REDACTED] history of medial left thigh pain.



[Series 1: us extrem low venous*left* · 0.08mm/px · 13 of 34 slices shown]
[im 1/34]
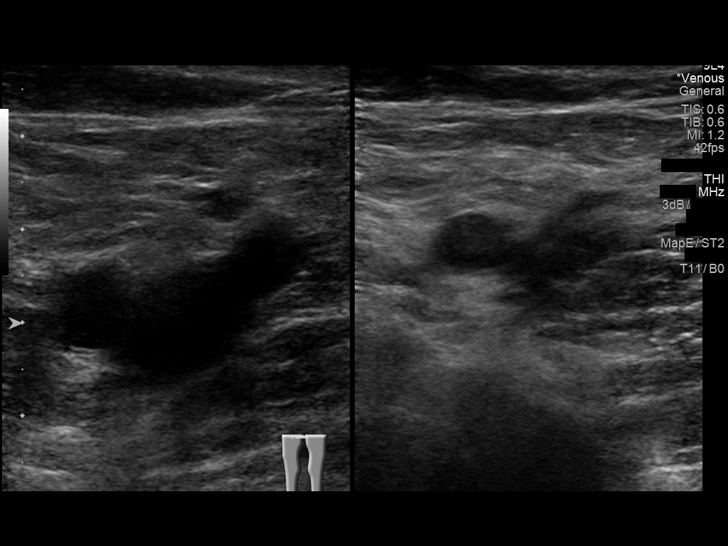
[im 3/34]
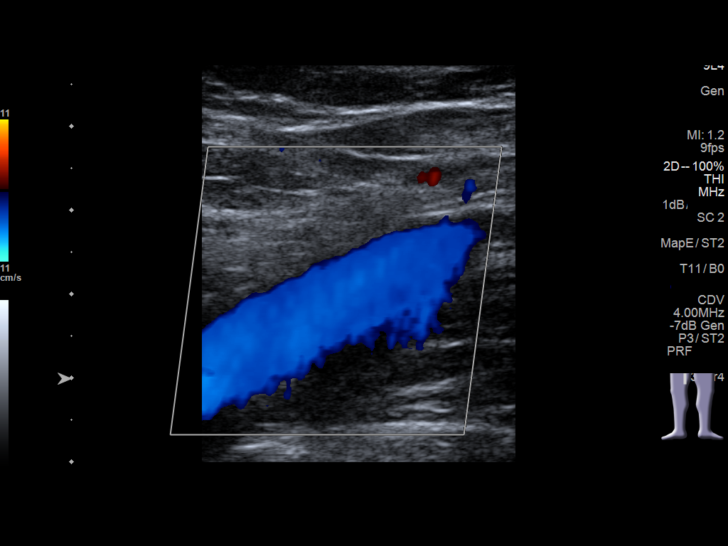
[im 6/34]
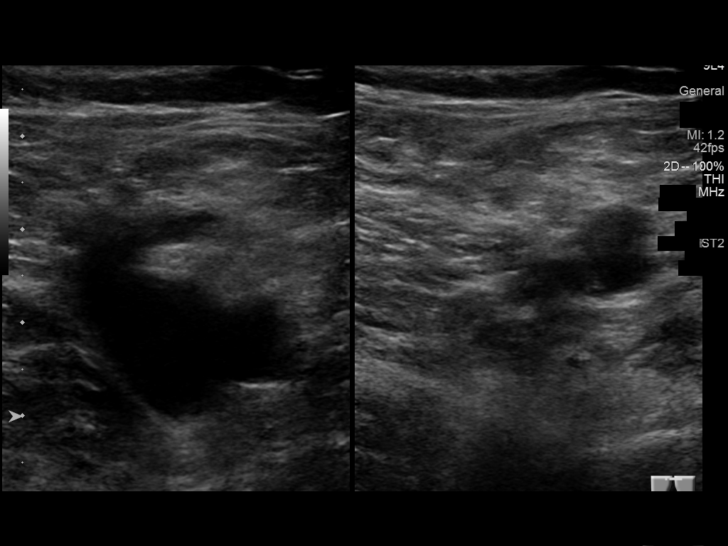
[im 9/34]
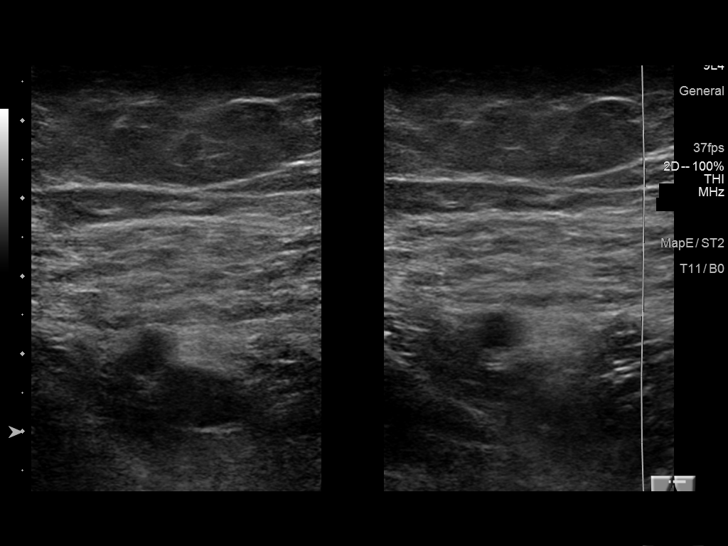
[im 12/34]
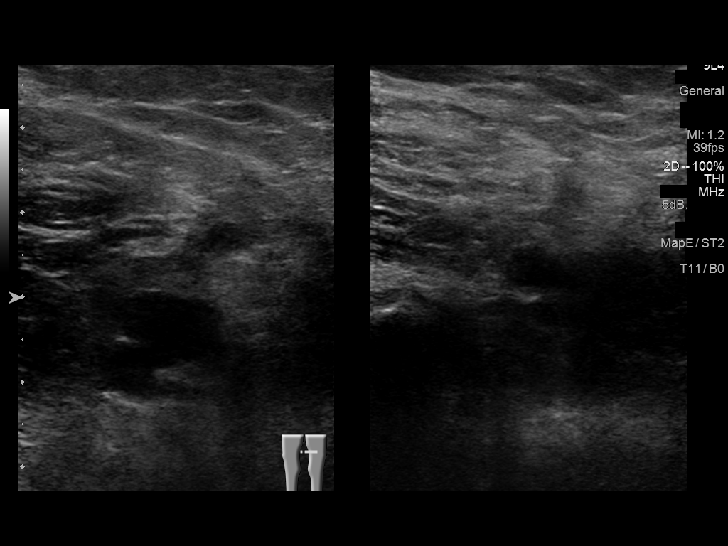
[im 15/34]
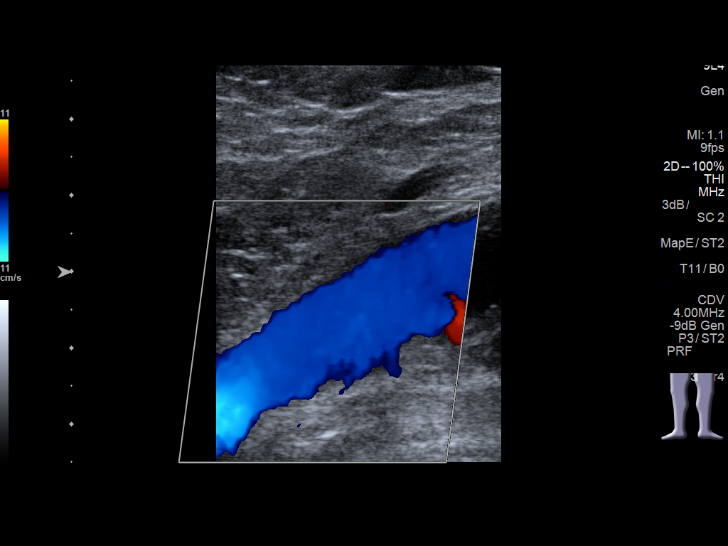
[im 18/34]
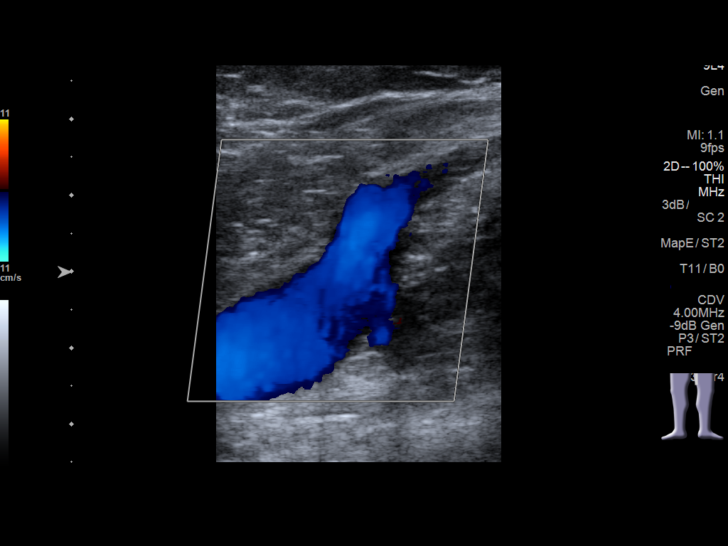
[im 19/34]
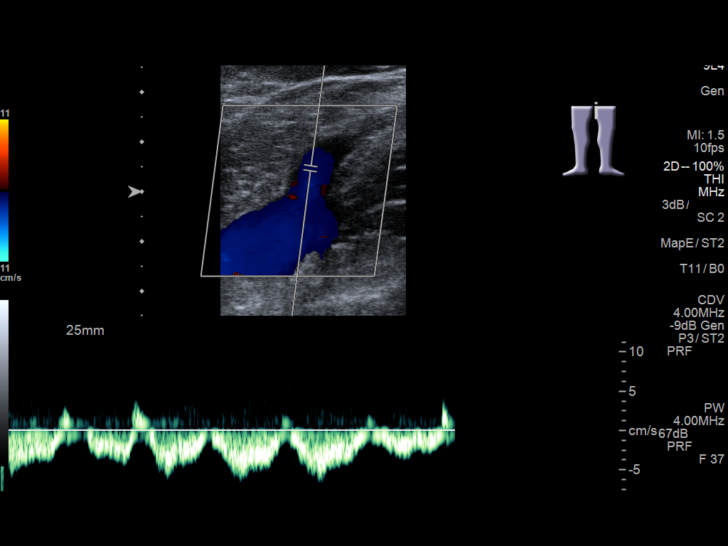
[im 22/34]
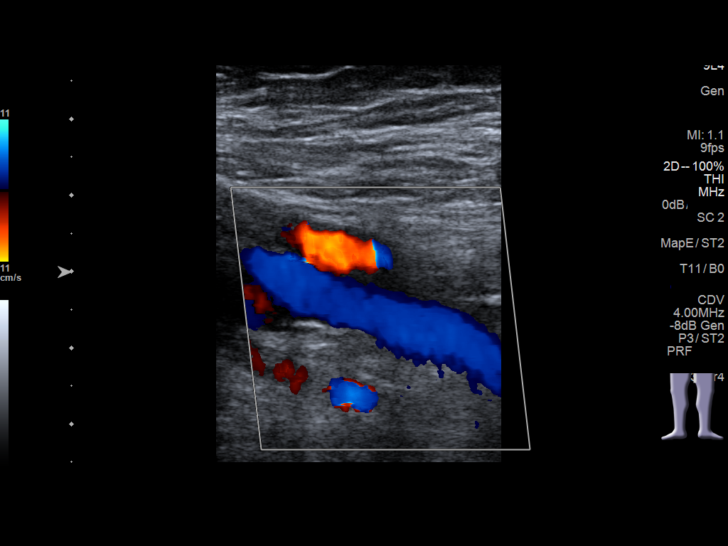
[im 25/34]
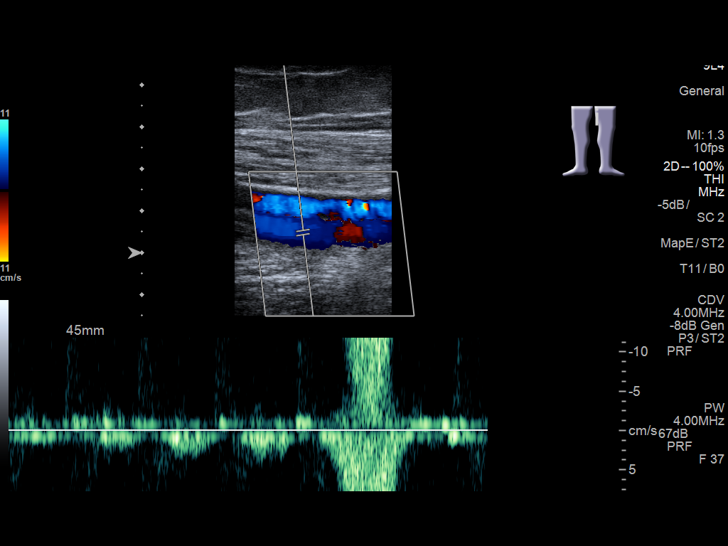
[im 28/34]
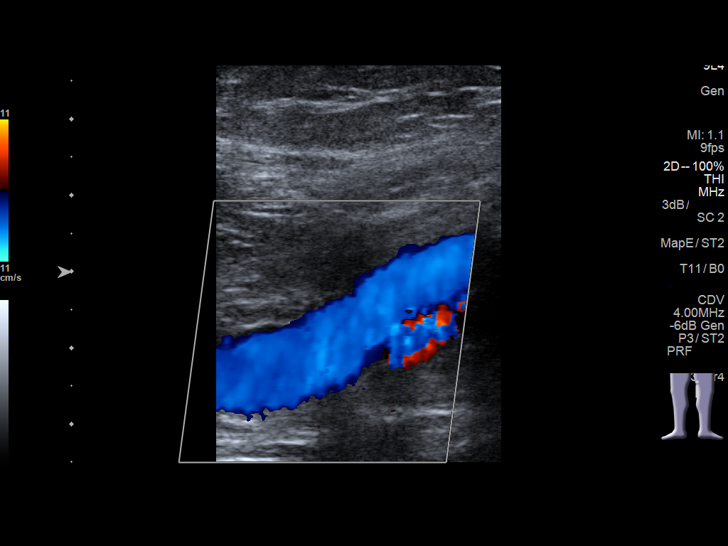
[im 31/34]
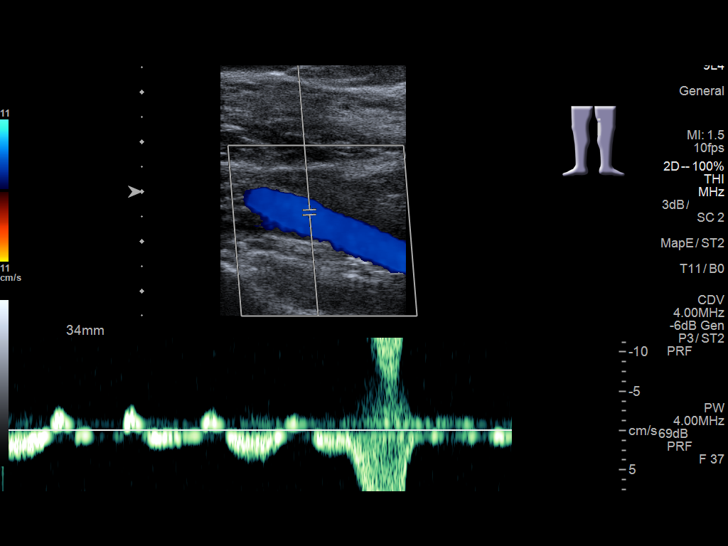
[im 34/34]
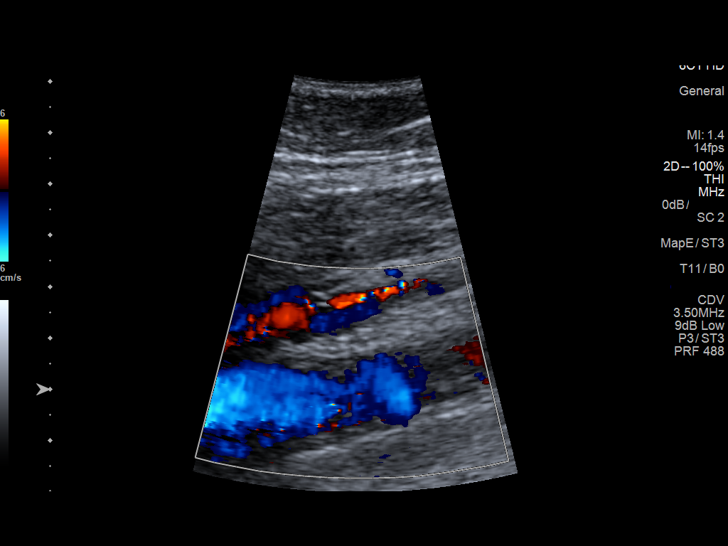

[13 of 24 positions shown; findings below may reference images not displayed]

FINDINGS: Contralateral Common Femoral Vein: Respiratory phasicity is normal
and symmetric with the symptomatic side. No evidence of thrombus.
Normal compressibility.

Common Femoral Vein: No evidence of thrombus. Normal
compressibility, respiratory phasicity and response to augmentation.

Saphenofemoral Junction: No evidence of thrombus. Normal
compressibility and flow on color Doppler imaging.

Profunda Femoral Vein: No evidence of thrombus. Normal
compressibility and flow on color Doppler imaging.

Femoral Vein: No evidence of thrombus. Normal compressibility,
respiratory phasicity and response to augmentation.

Popliteal Vein: No evidence of thrombus. Normal compressibility,
respiratory phasicity and response to augmentation.

Calf Veins: No evidence of thrombus. Normal compressibility and flow
on color Doppler imaging.

Other Findings:  None.
IMPRESSION: No evidence of deep venous thrombosis.

## 2017-05-18 ENCOUNTER — Emergency Department
Admission: EM | Admit: 2017-05-18 | Discharge: 2017-05-18 | Disposition: A | Discharge status: Home / Self Care | Payer: Self-pay | Attending: Emergency Medicine | Admitting: Emergency Medicine

## 2017-05-18 ENCOUNTER — Encounter: Payer: Self-pay | Admitting: Emergency Medicine

## 2017-05-18 DIAGNOSIS — M26603 Bilateral temporomandibular joint disorder, unspecified: Secondary | ICD-10-CM | POA: Insufficient documentation

## 2017-05-18 DIAGNOSIS — Z79899 Other long term (current) drug therapy: Secondary | ICD-10-CM | POA: Insufficient documentation

## 2017-05-18 DIAGNOSIS — M26623 Arthralgia of bilateral temporomandibular joint: Secondary | ICD-10-CM

## 2017-05-18 DIAGNOSIS — E039 Hypothyroidism, unspecified: Secondary | ICD-10-CM | POA: Insufficient documentation

## 2017-05-18 MED ORDER — ORPHENADRINE CITRATE 30 MG/ML IJ SOLN
60.0000 mg | INTRAMUSCULAR | Status: AC
  Administered 2017-05-18: 60 mg via INTRAMUSCULAR
  Filled 2017-05-18: qty 2

## 2017-05-18 MED ORDER — KETOROLAC TROMETHAMINE 60 MG/2ML IM SOLN
30.0000 mg | Freq: Once | INTRAMUSCULAR | Status: AC
  Administered 2017-05-18: 30 mg via INTRAMUSCULAR
  Filled 2017-05-18: qty 2

## 2017-05-18 MED ORDER — NABUMETONE 750 MG PO TABS: 750.0000 mg | ORAL_TABLET | Freq: Two times a day (BID) | ORAL | 0 refills | Status: DC

## 2017-05-18 MED ORDER — ORPHENADRINE CITRATE 30 MG/ML IJ SOLN: 30.0000 mg | INTRAMUSCULAR | Status: DC

## 2017-05-18 MED ORDER — CYCLOBENZAPRINE HCL 5 MG PO TABS: 5.0000 mg | ORAL_TABLET | Freq: Three times a day (TID) | ORAL | 0 refills | Status: DC | PRN

## 2017-05-18 NOTE — ED Triage Notes (Signed)
Patient with complaint of bilateral jaw pain radiating to her ears. Patient states that she feels like her jaw is "getting stuck". Patient states that the pain started about 2 hours ago. Patient able to open her mouth in triage she just states that it hurts.

## 2017-05-18 NOTE — ED Notes (Signed)
AAOx3.  Skin warm and dry.  NAD 

## 2017-05-18 NOTE — Discharge Instructions (Signed)
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 Take the prescription meds as directed. Avoid excessive chewing including meats, gum, or hard foods. Follow-up with your provider or a local dental provider for continued symptoms.

## 2017-05-19 NOTE — ED Provider Notes (Signed)
[facility] Emergency Department Provider Note ____________________________________________  Time seen: 2141  I have reviewed the triage vital signs and the nursing notes.  HISTORY  Chief Complaint  Jaw Pain  HPI Morgan Stevens is a 51 y.o. female Presents to the ED with complaints of bilateral jaw pain with referral into the ears. The patient describes the pain started about 2 hours prior to arrival. She was eating potato chips at the time of onset. She describes it feels like her jaw is "getting stuck." She reports being able to open her jaw but notes increased pain on the right greater than left sides. She denies any catching, clicking, locking. She also denies any previous history of TMJ dysfunction. She denies any trauma, chest pain, recent dental work, or any excessive chewing preceding the incident.  Past Medical History:  Diagnosis Date  . Hiatal hernia   . Obesity (BMI 30-39.9)   . Reflux   . Thyroid disease     Patient Active Problem List   Diagnosis Date Noted  . Pain in the chest   . Abnormal cardiac function test 02/26/2016  . Obesity (BMI 30-39.9)   . Angina pectoris (HCC) 02/25/2016  . Dyspnea 02/25/2016  . Hypothyroidism 02/25/2016  . Chest pain 09/07/2015    Past Surgical History:  Procedure Laterality Date  . CARDIAC CATHETERIZATION  02/27/2016   Procedure: Left Heart Cath and Coronary Angiography;  Surgeon: Corky Crafts, MD;  Location: [facility] INVASIVE CV LAB;  Service: Cardiovascular;;  . CESAREAN SECTION    . CHOLECYSTECTOMY    . THYROIDECTOMY    . THYROIDECTOMY      Prior to Admission medications   Medication Sig Start Date End Date Taking? Authorizing Provider  amoxicillin (AMOXIL) 500 MG capsule Take 1 capsule (500 mg total) by mouth 3 (three) times daily. Patient not taking: Reported on 02/23/2017 12/26/16   Joni Reining, PA-C  cyclobenzaprine (FLEXERIL) 5 MG tablet Take 1 tablet (5 mg total) by mouth 3 (three) times daily  as needed for muscle spasms. 05/18/17   Gloriana Piltz, Charlesetta Ivory, PA-C  gabapentin (NEURONTIN) 300 MG capsule Take 1 capsule (300 mg total) by mouth 3 (three) times daily. 03/16/17   Loleta Rose, MD  ibuprofen (ADVIL,MOTRIN) 600 MG tablet Take 1 tablet (600 mg total) by mouth every 8 (eight) hours as needed. Patient not taking: Reported on 02/23/2017 12/26/16   Joni Reining, PA-C  levothyroxine (SYNTHROID, LEVOTHROID) 137 MCG tablet Take 137 mcg by mouth daily before breakfast.    [provider]  lidocaine (XYLOCAINE) 2 % solution Use as directed 5 mLs in the mouth or throat every 6 (six) hours as needed for mouth pain. Patient not taking: Reported on 02/23/2017 12/26/16   Joni Reining, PA-C  nabumetone (RELAFEN) 750 MG tablet Take 1 tablet (750 mg total) by mouth 2 (two) times daily. 05/18/17   Zaelynn Fuchs, Charlesetta Ivory, PA-C  naproxen (NAPROSYN) 500 MG tablet Take 1 tablet (500 mg total) by mouth 2 (two) times daily with a meal. Patient not taking: Reported on 02/23/2017 10/24/16   Evon Slack, PA-C  omeprazole (PRILOSEC) 20 MG capsule Take 20 mg by mouth 2 (two) times daily before a meal.     [provider]  ranitidine (ZANTAC) 150 MG tablet Take 1 tablet (150 mg total) by mouth 2 (two) times daily. 07/28/16 07/28/17  Willy Eddy, MD  traMADol (ULTRAM) 50 MG tablet Take 1 tablet (50 mg total) by mouth every 6 (  six) hours as needed for moderate pain. Patient not taking: Reported on 03/11/2017 12/26/16   Joni Reining, PA-C    Allergies Patient has no known allergies.  Family History  Problem Relation Age of Onset  . Breast cancer Neg Hx     Social History Social History  Substance Use Topics  . Smoking status: Never Smoker  . Smokeless tobacco: Never Used  . Alcohol use No    Review of Systems  Constitutional: Negative for fever. Eyes: Negative for visual changes. ENT: Negative for sore throat. Cardiovascular: Negative for chest pain. Respiratory:  Negative for shortness of breath. Musculoskeletal: Negative for back pain. Jaw pain as above. Skin: Negative for rash. Neurological: Negative for headaches, focal weakness or numbness. ____________________________________________  PHYSICAL EXAM:  VITAL SIGNS: ED Triage Vitals  Enc Vitals Group     BP 05/18/17 2003 (!) 149/87     Pulse Rate 05/18/17 2003 74     Resp 05/18/17 2003 18     Temp 05/18/17 2003 98.5 F (36.9 C)     Temp Source 05/18/17 2003 Oral     SpO2 05/18/17 2003 100 %     Weight 05/18/17 2004 212 lb (96.2 kg)     Height 05/18/17 2004  (1.626 m)     Head Circumference --      Peak Flow --      Pain Score 05/18/17 2003 8     Pain Loc --      Pain Edu? --      Excl. in GC? --     Constitutional: Alert and oriented. Well appearing and in no distress. Head: Normocephalic and atraumatic. Eyes: Conjunctivae are normal. PERRL. Normal extraocular movements Ears: TMs obscured bilaterally due to cerumen. Nose: No congestion/rhinorrhea/epistaxis. Mouth/Throat: Mucous membranes are moist. Uvula is midline and tonsils are flat.No oropharyngeal erythema or lesions are noted. Patient with normal function of the mandible on exam. There is no palpable catching, clicking, or subluxation with jaw extension. Neck: Supple. No thyromegaly. Hematological/Lymphatic/Immunological: No cervical lymphadenopathy. Cardiovascular: Normal rate, regular rhythm. Normal distal pulses. Respiratory: Normal respiratory effort. No wheezes/rales/rhonchi. Musculoskeletal: Nontender with normal range of motion in all extremities.  Neurologic:  CN II-XII grossly intact. Normal speech and language. No gross focal neurologic deficits are appreciated. ____________________________________________  PROCEDURES  Toradol 30 mg IM Norflex 60 mg IM ____________________________________________  INITIAL IMPRESSION / ASSESSMENT AND PLAN / ED COURSE  Patient with out any signs of any subluxation of the  TMJ bilaterally. He will be discharged wth prescriptions for Relafen and cyclobenzaprine to dose as directed. She is given precautions on TMJ dysfunction and sill avoid excessive chewing and hard foods. She will follow-up with her primary care provider or dental provider for ongoing symptom management. ____________________________________________  FINAL CLINICAL IMPRESSION(S) / ED DIAGNOSES  Final diagnoses:  Bilateral temporomandibular joint pain      Karmen Stabs, Charlesetta Ivory, PA-C 05/19/17 0107    Sharman Cheek, MD 05/19/17 1615

## 2017-06-16 ENCOUNTER — Encounter: Payer: Self-pay | Admitting: Emergency Medicine

## 2017-06-16 ENCOUNTER — Emergency Department
Admission: EM | Admit: 2017-06-16 | Discharge: 2017-06-16 | Disposition: A | Discharge status: Home / Self Care | Payer: Self-pay | Attending: Emergency Medicine | Admitting: Emergency Medicine

## 2017-06-16 DIAGNOSIS — Z79899 Other long term (current) drug therapy: Secondary | ICD-10-CM | POA: Insufficient documentation

## 2017-06-16 DIAGNOSIS — E039 Hypothyroidism, unspecified: Secondary | ICD-10-CM | POA: Insufficient documentation

## 2017-06-16 DIAGNOSIS — H81399 Other peripheral vertigo, unspecified ear: Secondary | ICD-10-CM | POA: Insufficient documentation

## 2017-06-16 LAB — BASIC METABOLIC PANEL
Anion gap: 11 (ref 5–15)
BUN: 16 mg/dL (ref 6–20)
CO2: 22 mmol/L (ref 22–32)
Calcium: 9.5 mg/dL (ref 8.9–10.3)
Chloride: 102 mmol/L (ref 101–111)
Creatinine, Ser: 0.57 mg/dL (ref 0.44–1.00)
GFR calc Af Amer: >60 mL/min (ref >60)
GFR calc non Af Amer: >60 mL/min (ref >60)
Glucose, Bld: 109 mg/dL — ABNORMAL HIGH (ref 65–99)
Potassium: 3.7 mmol/L (ref 3.5–5.1)
Sodium: 135 mmol/L (ref 135–145)

## 2017-06-16 LAB — URINALYSIS, COMPLETE (UACMP) WITH MICROSCOPIC
Bacteria, UA: NONE SEEN
Bilirubin Urine: NEGATIVE
Glucose, UA: NEGATIVE mg/dL
Hgb urine dipstick: NEGATIVE
Ketones, ur: NEGATIVE mg/dL
Leukocytes, UA: NEGATIVE
Nitrite: NEGATIVE
Protein, ur: NEGATIVE mg/dL
RBC / HPF: NONE SEEN RBC/hpf (ref 0–5)
Specific Gravity, Urine: 1.004 — ABNORMAL LOW (ref 1.005–1.030)
Squamous Epithelial / LPF: NONE SEEN
WBC, UA: NONE SEEN WBC/hpf (ref 0–5)
pH: 6 (ref 5.0–8.0)

## 2017-06-16 LAB — CBC
HCT: 36.2 % (ref 35.0–47.0)
Hemoglobin: 12.2 g/dL (ref 12.0–16.0)
MCH: 27.7 pg (ref 26.0–34.0)
MCHC: 33.7 g/dL (ref 32.0–36.0)
MCV: 82.1 fL (ref 80.0–100.0)
Platelets: 297 10*3/uL (ref 150–440)
RBC: 4.41 MIL/uL (ref 3.80–5.20)
RDW: 15 % — ABNORMAL HIGH (ref 11.5–14.5)
WBC: 5.8 10*3/uL (ref 3.6–11.0)

## 2017-06-16 LAB — POCT PREGNANCY, URINE: Preg Test, Ur: NEGATIVE

## 2017-06-16 LAB — TROPONIN I: Troponin I: <0.03 ng/mL (ref <0.03)

## 2017-06-16 MED ORDER — MECLIZINE HCL 25 MG PO TABS
25.0000 mg | ORAL_TABLET | Freq: Once | ORAL | Status: AC
  Administered 2017-06-16: 25 mg via ORAL
  Filled 2017-06-16: qty 1

## 2017-06-16 MED ORDER — MECLIZINE HCL 25 MG PO TABS: 25.0000 mg | ORAL_TABLET | Freq: Three times a day (TID) | ORAL | 0 refills | Status: DC | PRN

## 2017-06-16 MED ORDER — ACETAMINOPHEN 325 MG PO TABS
650.0000 mg | ORAL_TABLET | Freq: Once | ORAL | Status: AC
  Administered 2017-06-16: 650 mg via ORAL
  Filled 2017-06-16: qty 2

## 2017-06-16 MED ORDER — ONDANSETRON HCL 4 MG PO TABS: 4.0000 mg | ORAL_TABLET | Freq: Three times a day (TID) | ORAL | 0 refills | Status: AC | PRN

## 2017-06-16 MED ORDER — ONDANSETRON HCL 4 MG/2ML IJ SOLN
4.0000 mg | Freq: Once | INTRAMUSCULAR | Status: AC
  Administered 2017-06-16: 4 mg via INTRAVENOUS
  Filled 2017-06-16: qty 2

## 2017-06-16 MED ORDER — SODIUM CHLORIDE 0.9 % IV BOLUS (SEPSIS)
1000.0000 mL | Freq: Once | INTRAVENOUS | Status: AC
  Administered 2017-06-16: 1000 mL via INTRAVENOUS

## 2017-06-16 NOTE — ED Provider Notes (Addendum)
[facility] Emergency Department Provider Note ____________________________________________   First MD Initiated Contact with Patient 06/16/17 [id number]     (approximate)  I have reviewed the triage vital signs and the nursing notes.   HISTORY  Chief Complaint Dizziness    HPI Morgan Stevens is a 51 y.o. female Past medical history of hypothyroidism and obesity who presents with dizziness, described both as a swimming or swirling sensation as well as feeling lightheaded like she might pass out, acute onset at 7:30 this morning while patient was in the kitchen, associated with nausea, and worse with movement. Patient reports mild frontal headache. She denies any prior history of this symptom.  Past Medical History:  Diagnosis Date  . Hiatal hernia   . Obesity (BMI 30-39.9)   . Reflux   . Thyroid disease     Patient Active Problem List   Diagnosis Date Noted  . Pain in the chest   . Abnormal cardiac function test 02/26/2016  . Obesity (BMI 30-39.9)   . Angina pectoris (HCC) 02/25/2016  . Dyspnea 02/25/2016  . Hypothyroidism 02/25/2016  . Chest pain 09/07/2015    Past Surgical History:  Procedure Laterality Date  . CARDIAC CATHETERIZATION  02/27/2016   Procedure: Left Heart Cath and Coronary Angiography;  Surgeon: Corky Crafts, MD;  Location: [facility] INVASIVE CV LAB;  Service: Cardiovascular;;  . CESAREAN SECTION    . CHOLECYSTECTOMY    . THYROIDECTOMY    . THYROIDECTOMY      Prior to Admission medications   Medication Sig Start Date End Date Taking? Authorizing Provider  amoxicillin (AMOXIL) 500 MG capsule Take 1 capsule (500 mg total) by mouth 3 (three) times daily. Patient not taking: Reported on 02/23/2017 12/26/16   Joni Reining, PA-C  cyclobenzaprine (FLEXERIL) 5 MG tablet Take 1 tablet (5 mg total) by mouth 3 (three) times daily as needed for muscle spasms. 05/18/17   Menshew, Charlesetta Ivory, PA-C  gabapentin (NEURONTIN) 300 MG capsule Take 1  capsule (300 mg total) by mouth 3 (three) times daily. 03/16/17   Loleta Rose, MD  ibuprofen (ADVIL,MOTRIN) 600 MG tablet Take 1 tablet (600 mg total) by mouth every 8 (eight) hours as needed. Patient not taking: Reported on 02/23/2017 12/26/16   Joni Reining, PA-C  levothyroxine (SYNTHROID, LEVOTHROID) 137 MCG tablet Take 137 mcg by mouth daily before breakfast.    [provider]  lidocaine (XYLOCAINE) 2 % solution Use as directed 5 mLs in the mouth or throat every 6 (six) hours as needed for mouth pain. Patient not taking: Reported on 02/23/2017 12/26/16   Joni Reining, PA-C  nabumetone (RELAFEN) 750 MG tablet Take 1 tablet (750 mg total) by mouth 2 (two) times daily. 05/18/17   Menshew, Charlesetta Ivory, PA-C  naproxen (NAPROSYN) 500 MG tablet Take 1 tablet (500 mg total) by mouth 2 (two) times daily with a meal. Patient not taking: Reported on 02/23/2017 10/24/16   Evon Slack, PA-C  omeprazole (PRILOSEC) 20 MG capsule Take 20 mg by mouth 2 (two) times daily before a meal.     [provider]  ranitidine (ZANTAC) 150 MG tablet Take 1 tablet (150 mg total) by mouth 2 (two) times daily. 07/28/16 07/28/17  Willy Eddy, MD  traMADol (ULTRAM) 50 MG tablet Take 1 tablet (50 mg total) by mouth every 6 (six) hours as needed for moderate pain. Patient not taking: Reported on 03/11/2017 12/26/16   Joni Reining, PA-C  Allergies Patient has no known allergies.  Family History  Problem Relation Age of Onset  . Breast cancer Neg Hx     Social History Social History  Substance Use Topics  . Smoking status: Never Smoker  . Smokeless tobacco: Never Used  . Alcohol use No    Review of Systems  Constitutional: No fever. Eyes: No visual changes. ENT: No neck pain.  Cardiovascular: Denies chest pain. Respiratory: Denies shortness of breath. Gastrointestinal: Positive for nausea, no vomiting.  Genitourinary: Negative for flank pain.  Musculoskeletal: Negative for back  pain. Skin: Negative for rash. Neurological: Positive for mild headache.    ____________________________________________   PHYSICAL EXAM:  VITAL SIGNS: ED Triage Vitals  Enc Vitals Group     BP 06/16/17 0826 122/61     Pulse Rate 06/16/17 0826 69     Resp 06/16/17 0826 18     Temp 06/16/17 0826 98 F (36.7 C)     Temp Source 06/16/17 0826 Oral     SpO2 06/16/17 0826 100 %     Weight 06/16/17 0827 207 lb (93.9 kg)     Height 06/16/17 0827 5\' 5"  (1.651 m)     Head Circumference --      Peak Flow --      Pain Score 06/16/17 0826 6     Pain Loc --      Pain Edu? --      Excl. in GC? --     Constitutional: Alert and oriented. Well appearing and in no acute distress. Eyes: Conjunctivae are normal.  Head: Atraumatic. Bilat TMs normal.  Nose: No congestion/rhinnorhea. Mouth/Throat: Mucous membranes are moist.   Neck: Normal range of motion.  Cardiovascular: Normal rate, regular rhythm. Grossly normal heart sounds.  Good peripheral circulation. Respiratory: Normal respiratory effort.  No retractions. Lungs CTAB. Gastrointestinal: Soft with mild LUQ discomfort. No distention.  Genitourinary: No flank tenderness. Musculoskeletal: No lower extremity edema.  Extremities warm and well perfused.  Neurologic:  Normal speech and language. No gross focal neurologic deficits are appreciated. Motor and sensory intact in all extremities.  Normal coordinations.  Cranial nerves III-XII intact.  Skin:  Skin is warm and dry. No rash noted. Psychiatric: Mood and affect are normal. Speech and behavior are normal.  ____________________________________________   LABS (all labs ordered are listed, but only abnormal results are displayed)  Labs Reviewed  BASIC METABOLIC PANEL - Abnormal; Notable for the following:       Result Value   Glucose, Bld 109 (*)    All other components within normal limits  CBC - Abnormal; Notable for the following:    RDW 15.0 (*)    All other components within  normal limits  URINALYSIS, COMPLETE (UACMP) WITH MICROSCOPIC - Abnormal; Notable for the following:    Color, Urine STRAW (*)    APPearance CLEAR (*)    Specific Gravity, Urine 1.004 (*)    All other components within normal limits  TROPONIN I  POC URINE PREG, ED  POCT PREGNANCY, URINE   ____________________________________________  EKG  ED ECG REPORT I, Dionne Bucy, the attending physician, personally viewed and interpreted this ECG.  Date: 06/16/2017 EKG Time: 827 Rate: 65 Rhythm: normal sinus rhythm QRS Axis: normal Intervals: normal ST/T Wave abnormalities: normal Narrative Interpretation: no evidence of acute ischemia  ____________________________________________  RADIOLOGY    ____________________________________________   PROCEDURES  Procedure(s) performed: No    Critical Care performed: No ____________________________________________   INITIAL IMPRESSION / ASSESSMENT AND PLAN / ED  COURSE  Pertinent labs & imaging results that were available during my care of the patient were reviewed by me and considered in my medical decision making (see chart for details).  51 year old female with history of hypothyroidism presents with acute onset of dizziness described both as a swimming and movement sensation, as well as lightheadedness, and associated with nausea. review of past medical records and epic is noncontributory.  On exam patient is slightly uncomfortable but relatively well-appearing, vital signs are normal, and the exam is unremarkable including normal neuro exam. Given the acute onset, worsening symptoms with movement, and associated nausea, differential is primarily for peripheral vertigo. There is no evidence of central vertigo given the acute onset of symptoms, patient's age and lack of risk factors. There may also be some component of vasovagal lightheadedness/near syncope due to patient's discomfort and nausea.  Low suspicion for cardiac cause.  Plan: Basic labs, EKG, troponin 1, fluids, and symptomatic treatment with meclizine and Zofran.    ----------------------------------------- 11:40 AM on 06/16/2017 -----------------------------------------  Patient reports resolved symptoms, and feels well. She would like to go home. The lab workup is unremarkable. No indication for imaging. Presentation consistent with peripheral vertigo; we will discharge with meclizine and Zofran, and patient is to follow-up with her primary care. Return precautions given.  ____________________________________________   FINAL CLINICAL IMPRESSION(S) / ED DIAGNOSES  Final diagnoses:  Peripheral vertigo, unspecified laterality      NEW MEDICATIONS STARTED DURING THIS VISIT:  New Prescriptions   No medications on file     Note:  This document was prepared using Dragon voice recognition software and may include unintentional dictation errors.    Dionne BucySiadecki, Lajoyce Tamura, MD 06/16/17 [id number]    Dionne BucySiadecki, Kaleea Penner, MD 06/16/17 1141

## 2017-06-16 NOTE — ED Notes (Signed)
Pt reports nausea and dizziness has improved  

## 2017-06-16 NOTE — Discharge Instructions (Signed)
Return to the ER for new or worsening dizziness, feeling like you are going to pass out, chest pain or difficulty breathing, severe headache, or any other new or worsening symptoms that concern you.  Follow-up with your primary care doctor, and if your symptoms persist you may need referral to see a neurologist.

## 2017-06-16 NOTE — ED Triage Notes (Signed)
Patient presents to the ED with sudden dizziness that began at 7:30am.  Patient reports feeling nauseous and reports a headache.  Patient states movement does not make dizziness worse.  Patient reports feeling like she could pass out.  Patient denies any confusion and no slurred speech is noted.  No unilateral weakness at this time.

## 2017-06-16 NOTE — ED Notes (Signed)
First Nurse Note: Pt c/o Dizziness and nausea that started this morning when she woke up at 0730. Pt states that she is so dizzy that she is not able to stand without assistance. No facial droop noted. Pt in NAD at this time.

## 2017-07-05 DIAGNOSIS — K5909 Other constipation: Secondary | ICD-10-CM | POA: Insufficient documentation

## 2017-07-18 ENCOUNTER — Emergency Department: Payer: Self-pay

## 2017-07-18 ENCOUNTER — Other Ambulatory Visit: Payer: Self-pay

## 2017-07-18 ENCOUNTER — Encounter: Payer: Self-pay | Admitting: Emergency Medicine

## 2017-07-18 ENCOUNTER — Emergency Department
Admission: EM | Admit: 2017-07-18 | Discharge: 2017-07-18 | Disposition: A | Discharge status: Home / Self Care | Payer: Self-pay | Attending: Emergency Medicine | Admitting: Emergency Medicine

## 2017-07-18 DIAGNOSIS — R0789 Other chest pain: Secondary | ICD-10-CM | POA: Insufficient documentation

## 2017-07-18 DIAGNOSIS — Z79899 Other long term (current) drug therapy: Secondary | ICD-10-CM | POA: Insufficient documentation

## 2017-07-18 DIAGNOSIS — E039 Hypothyroidism, unspecified: Secondary | ICD-10-CM | POA: Insufficient documentation

## 2017-07-18 LAB — TROPONIN I
Troponin I: <0.03 ng/mL (ref <0.03)
Troponin I: <0.03 ng/mL (ref <0.03)

## 2017-07-18 LAB — BASIC METABOLIC PANEL
Anion gap: 8 (ref 5–15)
BUN: 9 mg/dL (ref 6–20)
CO2: 24 mmol/L (ref 22–32)
Calcium: 8.8 mg/dL — ABNORMAL LOW (ref 8.9–10.3)
Chloride: 105 mmol/L (ref 101–111)
Creatinine, Ser: 0.78 mg/dL (ref 0.44–1.00)
GFR calc Af Amer: >60 mL/min (ref >60)
GFR calc non Af Amer: >60 mL/min (ref >60)
Glucose, Bld: 95 mg/dL (ref 65–99)
Potassium: 3.7 mmol/L (ref 3.5–5.1)
Sodium: 137 mmol/L (ref 135–145)

## 2017-07-18 LAB — CBC
HCT: 32.4 % — ABNORMAL LOW (ref 35.0–47.0)
Hemoglobin: 10.9 g/dL — ABNORMAL LOW (ref 12.0–16.0)
MCH: 28 pg (ref 26.0–34.0)
MCHC: 33.7 g/dL (ref 32.0–36.0)
MCV: 83.1 fL (ref 80.0–100.0)
Platelets: 304 10*3/uL (ref 150–440)
RBC: 3.9 MIL/uL (ref 3.80–5.20)
RDW: 14.3 % (ref 11.5–14.5)
WBC: 5.2 10*3/uL (ref 3.6–11.0)

## 2017-07-18 LAB — URINALYSIS, COMPLETE (UACMP) WITH MICROSCOPIC
Bilirubin Urine: NEGATIVE
Glucose, UA: NEGATIVE mg/dL
Hgb urine dipstick: NEGATIVE
Ketones, ur: NEGATIVE mg/dL
Leukocytes, UA: NEGATIVE
Nitrite: NEGATIVE
Protein, ur: NEGATIVE mg/dL
Specific Gravity, Urine: 1.004 — ABNORMAL LOW (ref 1.005–1.030)
WBC, UA: NONE SEEN WBC/hpf (ref 0–5)
pH: 7 (ref 5.0–8.0)

## 2017-07-18 IMAGING — CR DG CHEST 2V
2 series · 2 of 2 positions shown · non-contrast
Comparison: [DATE]

CLINICAL DATA: Posterior left-sided chest pain since this morning.

EXAM:
CHEST  2 VIEW

[chest pa]
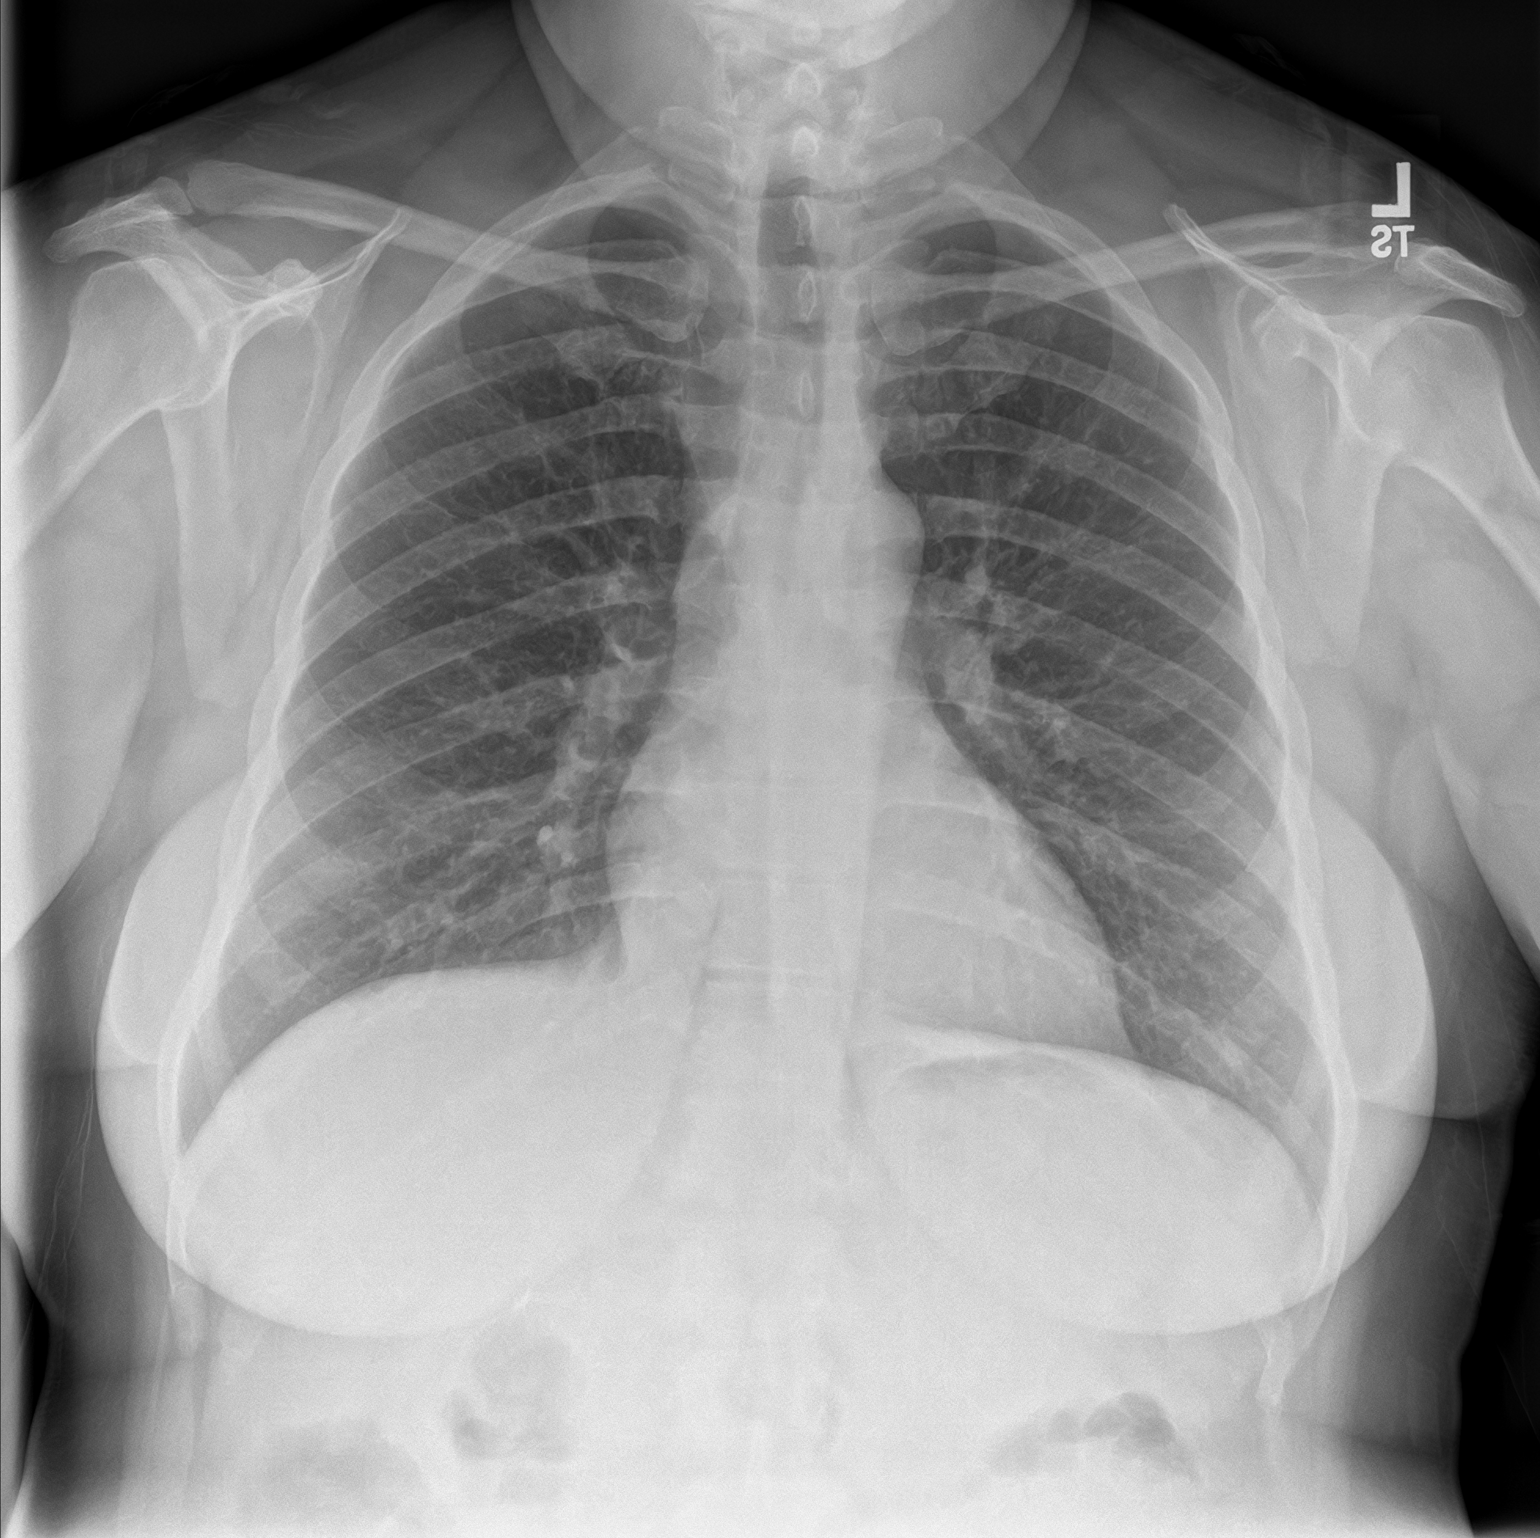

[chest lat]
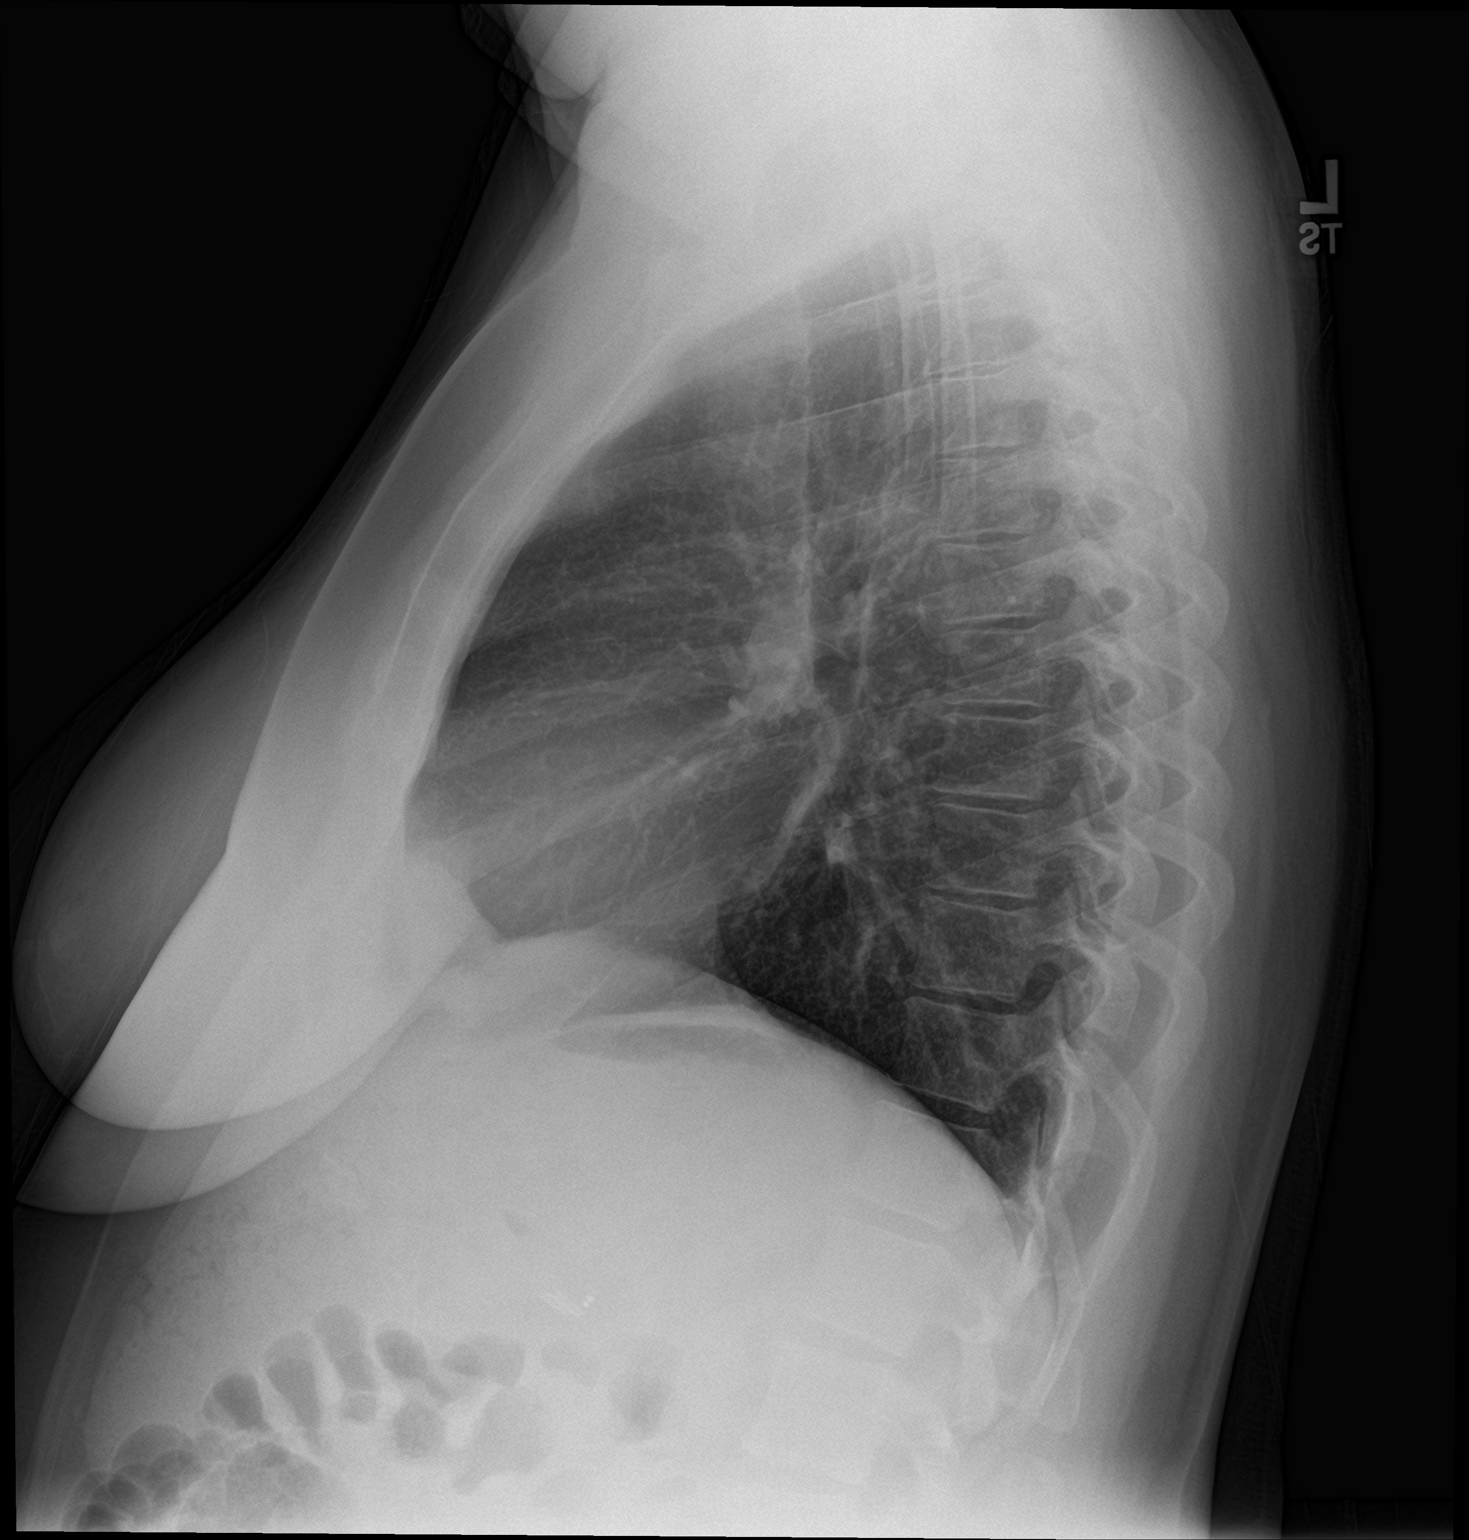

[2 of 2 positions shown; findings below may reference images not displayed]

FINDINGS: The heart size and mediastinal contours are within normal limits.
Both lungs are clear. No pneumothorax or effusion. The visualized
skeletal structures are unremarkable.
IMPRESSION: No active cardiopulmonary disease.

## 2017-07-18 MED ORDER — KETOROLAC TROMETHAMINE 30 MG/ML IJ SOLN
30.0000 mg | Freq: Once | INTRAMUSCULAR | Status: AC
Start: 2017-07-18 — End: 2017-07-18
  Administered 2017-07-18: 30 mg via INTRAVENOUS
  Filled 2017-07-18: qty 1

## 2017-07-18 NOTE — Discharge Instructions (Signed)
You may take over-the-counter ibuprofen or Naprosyn for the pain.  Return to the emergency department for new or worsening pain, difficulty breathing, palpitations, weakness, or any other new or worsening symptoms that concern you.  Follow up with your primary care doctor within the next 1-2 weeks.

## 2017-07-18 NOTE — ED Triage Notes (Signed)
Pt c/o pain under left breast starting this morning. Has been constant and seems worse with movement. Radiates from under breast around to back.  No fever or cough.  denies recent travel.

## 2017-07-18 NOTE — ED Provider Notes (Signed)
[facility] Emergency Department Provider Note ____________________________________________   First MD Initiated Contact with Patient 07/18/17 1742     (approximate)  I have reviewed the triage vital signs and the nursing notes.   HISTORY  Chief Complaint Chest Pain    HPI Morgan CoventryRuth Stevens is a 51 y.o. female with past medical history as noted below who presents with left thoracic back pain, acute onset when she woke this morning, radiating around the left side of her chest, worse with certain positions and movements, not associated with cough, fever, shortness of breath, lightheadedness, nausea, or anterior chest pain.  No prior history of this pain.  No recent change in activity or any trauma.  No leg swelling.  Past Medical History:  Diagnosis Date  . Hiatal hernia   . Obesity (BMI 30-39.9)   . Reflux   . Thyroid disease     Patient Active Problem List   Diagnosis Date Noted  . Pain in the chest   . Abnormal cardiac function test 02/26/2016  . Obesity (BMI 30-39.9)   . Angina pectoris (HCC) 02/25/2016  . Dyspnea 02/25/2016  . Hypothyroidism 02/25/2016  . Chest pain 09/07/2015    Past Surgical History:  Procedure Laterality Date  . CARDIAC CATHETERIZATION  02/27/2016   Procedure: Left Heart Cath and Coronary Angiography;  Surgeon: Corky CraftsJayadeep S Varanasi, MD;  [facility] INVAS[facility] CV LAB;  Service: Cardiovascular;;  . CESAREAN SECTION    . CHOLECYSTECTOMY    . THYROIDECTOMY    . THYROIDECTOMY      Prior to Admission medications   Medication Sig Start Date End Date Taking? Authorizing Provider  amoxicillin (AMOXIL) 500 MG capsule Take 1 capsule (500 mg total) by mouth 3 (three) times daily. Patient not taking: Reported on 02/23/2017 12/26/16   Joni ReiningSmith, Ronald K, PA-C  cyclobenzaprine (FLEXERIL) 5 MG tablet Take 1 tablet (5 mg total) by mouth 3 (three) times daily as needed for muscle spasms. 05/18/17   Menshew, Charlesetta IvoryJenise V Bacon, PA-C  gabapentin  (NEURONTIN) 300 MG capsule Take 1 capsule (300 mg total) by mouth 3 (three) times daily. 03/16/17   Loleta RoseForbach, Cory, MD  ibuprofen (ADVIL,MOTRIN) 600 MG tablet Take 1 tablet (600 mg total) by mouth every 8 (eight) hours as needed. Patient not taking: Reported on 02/23/2017 12/26/16   Joni ReiningSmith, Ronald K, PA-C  levothyroxine (SYNTHROID, LEVOTHROID) 137 MCG tablet Take 137 mcg by mouth daily before breakfast.    [provider]  lidocaine (XYLOCAINE) 2 % solution Use as directed 5 mLs in the mouth or throat every 6 (six) hours as needed for mouth pain. Patient not taking: Reported on 02/23/2017 12/26/16   Joni ReiningSmith, Ronald K, PA-C  meclizine (ANTIVERT) 25 MG tablet Take 1 tablet (25 mg total) by mouth 3 (three) times daily as needed for dizziness. 06/16/17   Dionne BucySiadecki, Avereigh Spainhower, MD  nabumetone (RELAFEN) 750 MG tablet Take 1 tablet (750 mg total) by mouth 2 (two) times daily. 05/18/17   Menshew, Charlesetta IvoryJenise V Bacon, PA-C  naproxen (NAPROSYN) 500 MG tablet Take 1 tablet (500 mg total) by mouth 2 (two) times daily with a meal. Patient not taking: Reported on 02/23/2017 10/24/16   Evon SlackGaines, Thomas C, PA-C  omeprazole (PRILOSEC) 20 MG capsule Take 20 mg by mouth 2 (two) times daily before a meal.     [provider]  ranitidine (ZANTAC) 150 MG tablet Take 1 tablet (150 mg total) by mouth 2 (two) times daily. 07/28/16 07/28/17  Willy Eddyobinson, Patrick, MD  traMADol (  ULTRAM) 50 MG tablet Take 1 tablet (50 mg total) by mouth every 6 (six) hours as needed for moderate pain. Patient not taking: Reported on 03/11/2017 12/26/16   Joni Reining, PA-C    Allergies Patient has no known allergies.  Family History  Problem Relation Age of Onset  . Breast cancer Neg Hx     Social History Social History   Tobacco Use  . Smoking status: Never Smoker  . Smokeless tobacco: Never Used  Substance Use Topics  . Alcohol use: No    Alcohol/week: 0.0 oz  . Drug use: No    Review of Systems  Constitutional: No  fever/chills. Eyes: No visual changes. ENT: No neck pain. Cardiovascular: Positive for lateral chest pain. Respiratory: Denies shortness of breath. Gastrointestinal: No nausea, no vomiting.  No diarrhea.  Genitourinary: Negative for dysuria or hematuria.  Musculoskeletal: Positive for back pain. Skin: Negative for rash. Neurological: Negative for headaches, focal weakness or numbness.   ____________________________________________   PHYSICAL EXAM:  VITAL SIGNS: ED Triage Vitals  Enc Vitals Group     BP 07/18/17 1637 (!) 142/66     Pulse Rate 07/18/17 1637 78     Resp 07/18/17 1637 16     Temp 07/18/17 1637 98.1 F (36.7 C)     Temp Source 07/18/17 1637 Oral     SpO2 07/18/17 1637 100 %     Weight 07/18/17 1634 210 lb (95.3 kg)     Height 07/18/17 1634 5\' 4"  (1.626 m)     Head Circumference --      Peak Flow --      Pain Score 07/18/17 1631 8     Pain Loc --      Pain Edu? --      Excl. in GC? --     Constitutional: Alert and oriented. Well appearing and in no acute distress. Eyes: Conjunctivae are normal.   Head: Atraumatic. Nose: No congestion/rhinnorhea. Mouth/Throat: Mucous membranes are moist.   Neck: Normal range of motion.  Cardiovascular: Normal rate, regular rhythm. Grossly normal heart sounds.  Good peripheral circulation.  Respiratory: Normal respiratory effort.  No retractions. Lungs CTAB. Gastrointestinal: Soft and nontender. No distention.  Genitourinary: No CVA tenderness. Musculoskeletal: No lower extremity edema.  Extremities warm and well perfused.  Mild left lateral chest wall and thoracic paraspinal tenderness, reproducing the pain.  No midline spinal tenderness. Neurologic:  Normal speech and language. No gross focal neurologic deficits are appreciated.  Skin:  Skin is warm and dry. No rash noted. Psychiatric: Mood and affect are normal. Speech and behavior are normal.  ____________________________________________   LABS (all labs ordered are  listed, but only abnormal results are displayed)  Labs Reviewed  BASIC METABOLIC PANEL - Abnormal; Notable for the following components:      Result Value   Calcium 8.8 (*)    All other components within normal limits  CBC - Abnormal; Notable for the following components:   Hemoglobin 10.9 (*)    HCT 32.4 (*)    All other components within normal limits  URINALYSIS, COMPLETE (UACMP) WITH MICROSCOPIC - Abnormal; Notable for the following components:   Color, Urine STRAW (*)    APPearance CLEAR (*)    Specific Gravity, Urine 1.004 (*)    Bacteria, UA RARE (*)    Squamous Epithelial / LPF 0-5 (*)    All other components within normal limits  TROPONIN I  TROPONIN I   ____________________________________________  EKG  ED ECG REPORT I,  Dionne BucySebastian Karman Veney, the attending physician, personally viewed and interpreted this ECG.  Date: 07/18/2017 EKG Time: 1634 Rate: 73 Rhythm: normal sinus rhythm QRS Axis: normal Intervals: normal ST/T Wave abnormalities: normal Narrative Interpretation: no evidence of acute ischemia  ____________________________________________  RADIOLOGY  CXR: No focal infiltrate or other acute findings.  ____________________________________________   PROCEDURES  Procedure(s) performed: No    Critical Care performed: No ____________________________________________   INITIAL IMPRESSION / ASSESSMENT AND PLAN / ED COURSE  Pertinent labs & imaging results that were available during my care of the patient were reviewed by me and considered in my medical decision making (see chart for details).  51 year old female with past medical history as noted above presents with relatively acute onset of atypical left lateral chest wall and thoracic back pain which is positional and reproducible.  Review of past medical records in epic is noncontributory; patient has had several ED visits however none for similar symptoms, and has no significant cardiac  history.  Patient has reproducible mild tenderness on exam, but otherwise exam is unremarkable.  Her vital signs are normal and she is well-appearing.  EKG is nonischemic.  Presentation consistent with muscular thoracic back pain, however given the location, also consider cardiopulmonary causes or less likely UTI/pyelo.  She has no PE risk factors, no signs or symptoms of DVT, no exam findings to suggest acute PE.  Plan for chest x-ray, troponins x2, basic labs, and give NSAIDs for symptomatic treatment.  Anticipate discharge home if negative workup.     ----------------------------------------- 8:34 PM on 07/18/2017 -----------------------------------------  Patient states that her pain is significantly improved after Toradol.  Repeat troponin and UA are negative.  There is no indication for further ED workup or observation.  Patient feels well to go home.  Return precautions given, and patient expresses understanding. ____________________________________________   FINAL CLINICAL IMPRESSION(S) / ED DIAGNOSES  Final diagnoses:  Chest wall pain      NEW MEDICATIONS STARTED DURING THIS VISIT:  This SmartLink is deprecated. Use AVSMEDLIST instead to display the medication list for a patient.   Note:  This document was prepared using Dragon voice recognition software and may include unintentional dictation errors.    Dionne BucySiadecki, Norm Wray, MD 07/18/17 2035

## 2017-07-18 NOTE — ED Notes (Signed)
Pt discharged to home.  Family member driving.  Discharge instructions reviewed.  Verbalized understanding.  No questions or concerns at this time.  Teach back verified.  Pt in NAD.  No items left in ED.   

## 2017-08-06 ENCOUNTER — Emergency Department: Payer: Self-pay

## 2017-08-06 ENCOUNTER — Encounter: Payer: Self-pay | Admitting: Emergency Medicine

## 2017-08-06 ENCOUNTER — Emergency Department
Admission: EM | Admit: 2017-08-06 | Discharge: 2017-08-06 | Disposition: A | Discharge status: Home / Self Care | Payer: Self-pay | Attending: Emergency Medicine | Admitting: Emergency Medicine

## 2017-08-06 DIAGNOSIS — M436 Torticollis: Secondary | ICD-10-CM | POA: Insufficient documentation

## 2017-08-06 DIAGNOSIS — Z79899 Other long term (current) drug therapy: Secondary | ICD-10-CM | POA: Insufficient documentation

## 2017-08-06 DIAGNOSIS — E039 Hypothyroidism, unspecified: Secondary | ICD-10-CM | POA: Insufficient documentation

## 2017-08-06 LAB — BASIC METABOLIC PANEL
Anion gap: 9 (ref 5–15)
BUN: 11 mg/dL (ref 6–20)
CO2: 23 mmol/L (ref 22–32)
Calcium: 9 mg/dL (ref 8.9–10.3)
Chloride: 105 mmol/L (ref 101–111)
Creatinine, Ser: 0.77 mg/dL (ref 0.44–1.00)
GFR calc Af Amer: >60 mL/min (ref >60)
GFR calc non Af Amer: >60 mL/min (ref >60)
Glucose, Bld: 105 mg/dL — ABNORMAL HIGH (ref 65–99)
Potassium: 3.5 mmol/L (ref 3.5–5.1)
Sodium: 137 mmol/L (ref 135–145)

## 2017-08-06 LAB — CBC
HCT: 33.8 % — ABNORMAL LOW (ref 35.0–47.0)
Hemoglobin: 11.7 g/dL — ABNORMAL LOW (ref 12.0–16.0)
MCH: 28.5 pg (ref 26.0–34.0)
MCHC: 34.7 g/dL (ref 32.0–36.0)
MCV: 82.2 fL (ref 80.0–100.0)
Platelets: 306 10*3/uL (ref 150–440)
RBC: 4.11 MIL/uL (ref 3.80–5.20)
RDW: 14 % (ref 11.5–14.5)
WBC: 4.8 10*3/uL (ref 3.6–11.0)

## 2017-08-06 LAB — TROPONIN I: Troponin I: <0.03 ng/mL (ref <0.03)

## 2017-08-06 LAB — POCT PREGNANCY, URINE: Preg Test, Ur: NEGATIVE

## 2017-08-06 IMAGING — CR DG CHEST 2V
2 series · 2 of 2 positions shown · non-contrast
Comparison: Chest radiograph [DATE]

CLINICAL DATA: Chest pain

EXAM:
CHEST  2 VIEW

[chest pa]
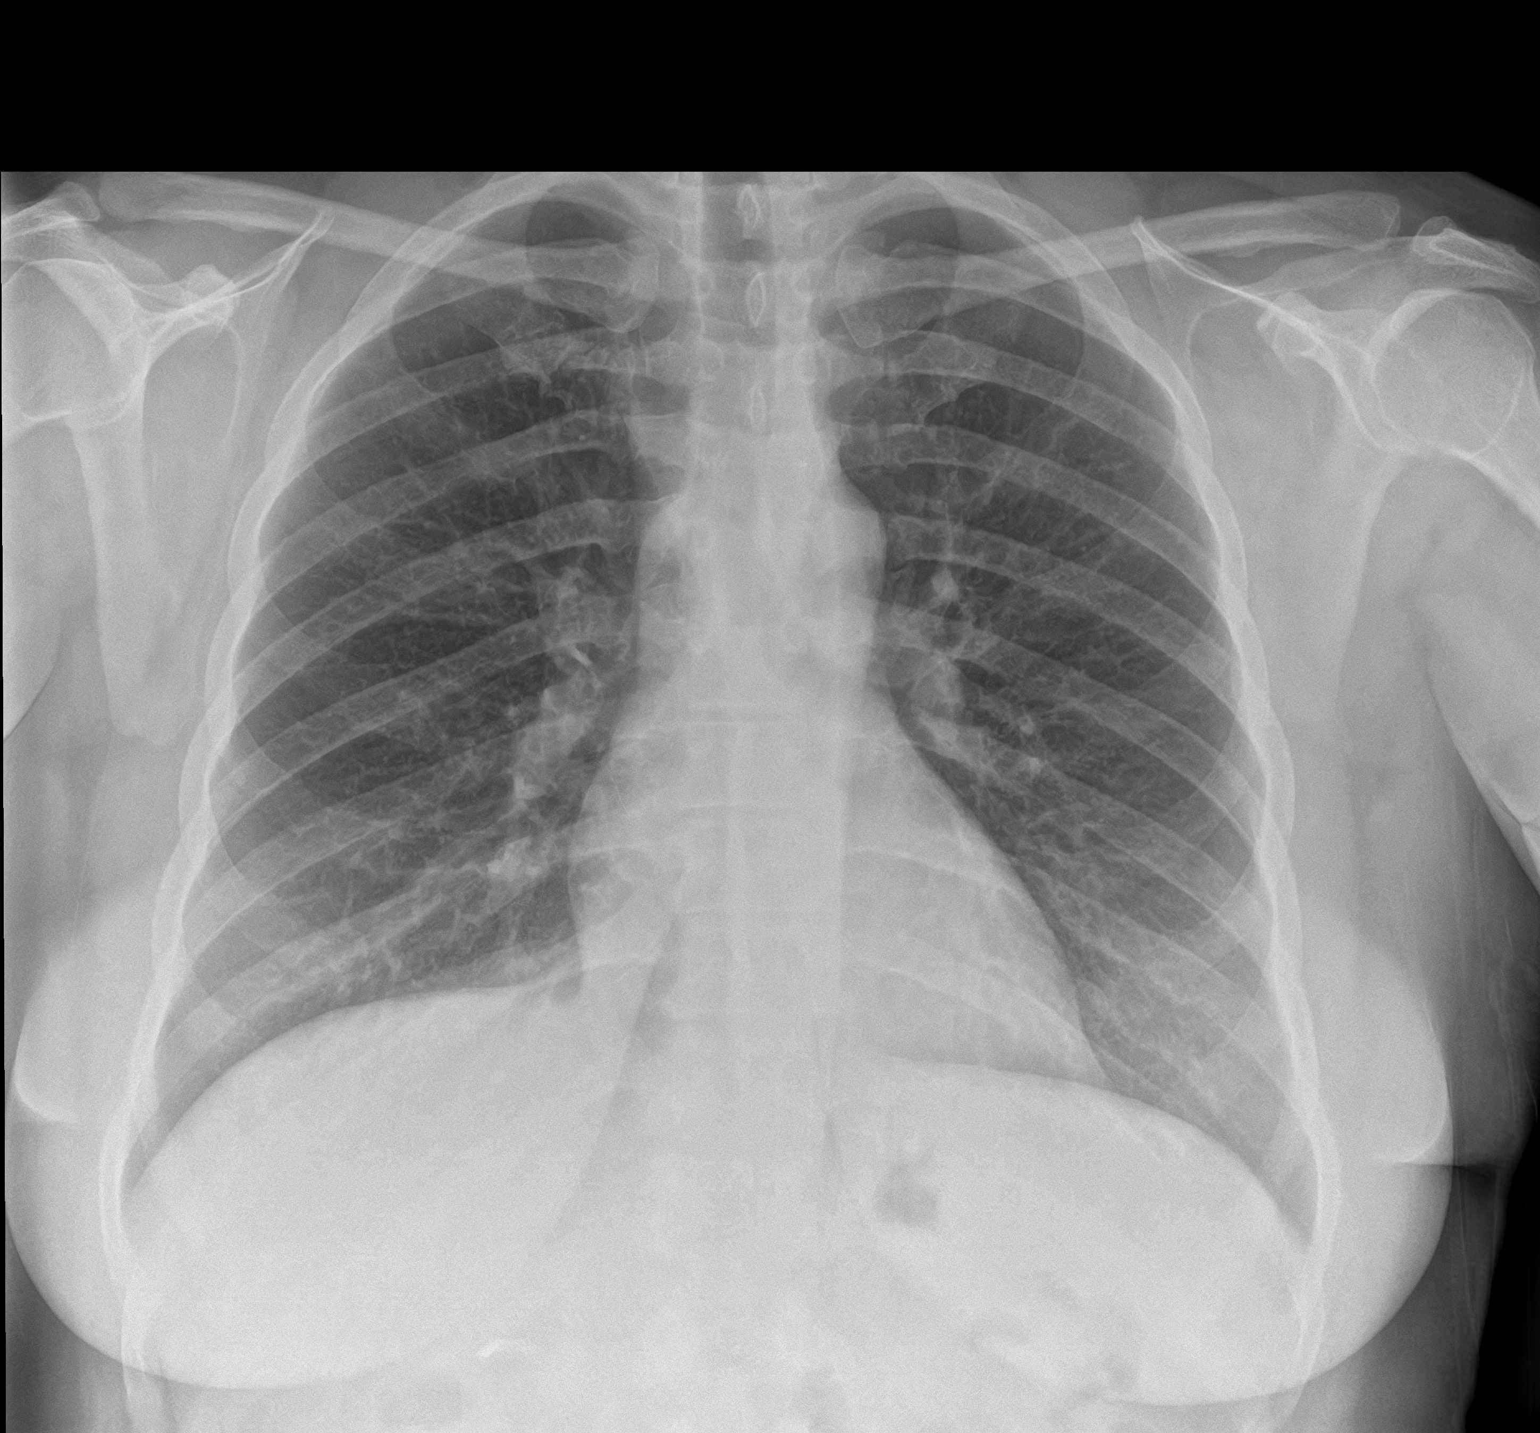

[chest lat]
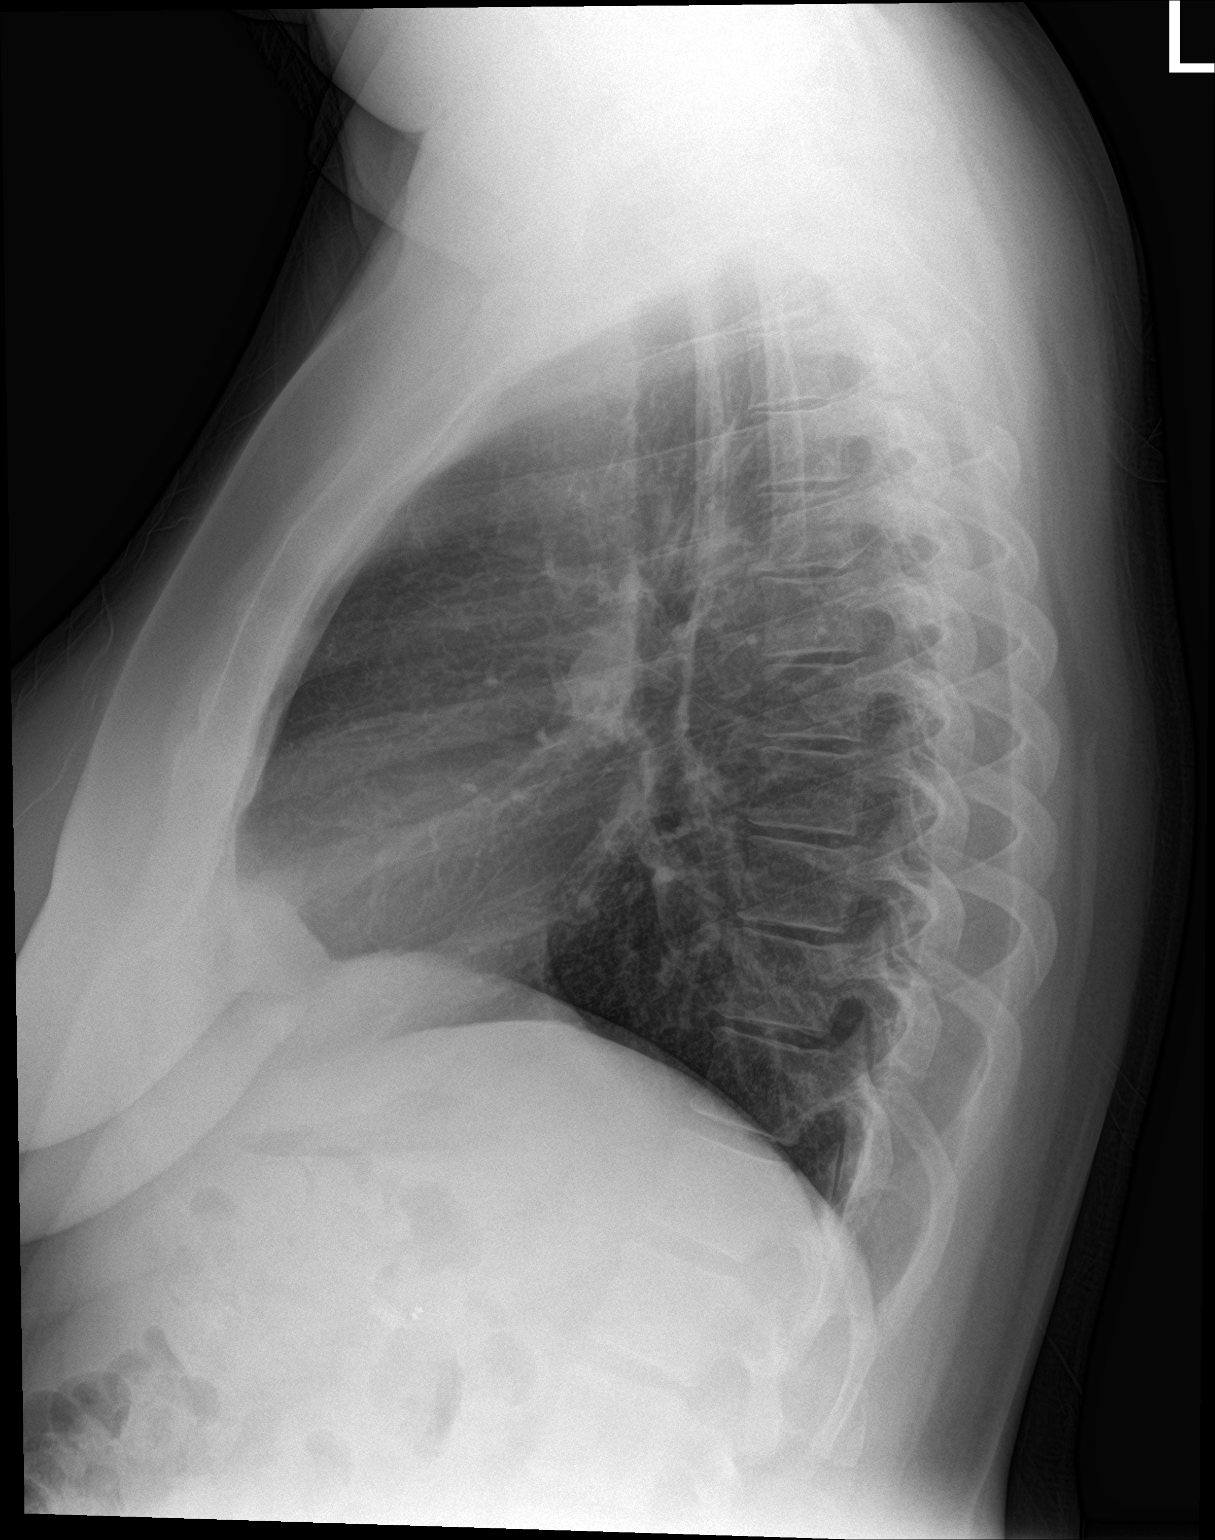

[2 of 2 positions shown; findings below may reference images not displayed]

FINDINGS: The heart size and mediastinal contours are within normal limits.
Both lungs are clear. The visualized skeletal structures are
unremarkable.
IMPRESSION: Normal chest.

## 2017-08-06 MED ORDER — NAPROXEN 375 MG PO TABS: 375.0000 mg | ORAL_TABLET | Freq: Two times a day (BID) | ORAL | 0 refills | Status: AC

## 2017-08-06 NOTE — ED Triage Notes (Signed)
Patient presents to the ED with "burning" pain to her left shoulder shoulder radiating into her neck.  Patient states nothing makes pain worse or better.  Patient is in no obvious distress at this time.  Patient denies shortness of breath and nausea.

## 2017-08-06 NOTE — ED Provider Notes (Signed)
[facility] Emergency Department Provider Note  ____________________________________________   I have reviewed the triage vital signs and the nursing notes. Where available I have reviewed prior notes and, if possible and indicated, outside hospital notes.    HISTORY  Chief Complaint Shoulder Pain and Neck Pain    HPI Morgan Stevens is a 51 y.o. female who presents for the 14th time in the last 12 months complaining of a pain related issue.  Today, it is a pain in her left trapezius muscle which has been there for a few hours, woke up with it after sleeping on it wrong.  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  Has chronic left ear pain but this is different.  Denies any stiff neck fever or headache.  She not have any chest pain or shortness of breath and she otherwise feels at her baseline.  It is a left sharp pain with no significant radiation, since this morning, no significant prior treatment took over-the-counter with minimal relief   Past Medical History:  Diagnosis Date  . Hiatal hernia   . Obesity (BMI 30-39.9)   . Reflux   . Thyroid disease     Patient Active Problem List   Diagnosis Date Noted  . Pain in the chest   . Abnormal cardiac function test 02/26/2016  . Obesity (BMI 30-39.9)   . Angina pectoris (HCC) 02/25/2016  . Dyspnea 02/25/2016  . Hypothyroidism 02/25/2016  . Chest pain 09/07/2015    Past Surgical History:  Procedure Laterality Date  . CARDIAC CATHETERIZATION  02/27/2016   Procedure: Left Heart Cath and Coronary Angiography;  Surgeon: Corky Crafts, MD;  Location: [facility] INVASIVE CV LAB;  Service: Cardiovascular;;  . CESAREAN SECTION    . CHOLECYSTECTOMY    . THYROIDECTOMY    . THYROIDECTOMY      Prior to Admission medications   Medication Sig Start Date End Date Taking? Authorizing Provider  amoxicillin (AMOXIL) 500 MG capsule Take 1 capsule (500 mg  total) by mouth 3 (three) times daily. Patient not taking: Reported on 02/23/2017 12/26/16   Joni Reining, PA-C  cyclobenzaprine (FLEXERIL) 5 MG tablet Take 1 tablet (5 mg total) by mouth 3 (three) times daily as needed for muscle spasms. 05/18/17   Menshew, Charlesetta Ivory, PA-C  gabapentin (NEURONTIN) 300 MG capsule Take 1 capsule (300 mg total) by mouth 3 (three) times daily. 03/16/17   Loleta Rose, MD  ibuprofen (ADVIL,MOTRIN) 600 MG tablet Take 1 tablet (600 mg total) by mouth every 8 (eight) hours as needed. Patient not taking: Reported on 02/23/2017 12/26/16   Joni Reining, PA-C  levothyroxine (SYNTHROID, LEVOTHROID) 137 MCG tablet Take 137 mcg by mouth daily before breakfast.    [provider]  lidocaine (XYLOCAINE) 2 % solution Use as directed 5 mLs in the mouth or throat every 6 (six) hours as needed for mouth pain. Patient not taking: Reported on 02/23/2017 12/26/16   Joni Reining, PA-C  meclizine (ANTIVERT) 25 MG tablet Take 1 tablet (25 mg total) by mouth 3 (three) times daily as needed for dizziness. 06/16/17   Dionne Bucy, MD  nabumetone (RELAFEN) 750 MG tablet Take 1 tablet (750 mg total) by mouth 2 (two) times daily. 05/18/17   Menshew, Charlesetta Ivory, PA-C  naproxen (NAPROSYN) 500 MG tablet Take 1 tablet (500 mg total) by mouth 2 (two) times daily with a meal. Patient not  taking: Reported on 02/23/2017 10/24/16   Evon Slack, PA-C  omeprazole (PRILOSEC) 20 MG capsule Take 20 mg by mouth 2 (two) times daily before a meal.     [provider]  ranitidine (ZANTAC) 150 MG tablet Take 1 tablet (150 mg total) by mouth 2 (two) times daily. 07/28/16 07/28/17  Willy Eddy, MD  traMADol (ULTRAM) 50 MG tablet Take 1 tablet (50 mg total) by mouth every 6 (six) hours as needed for moderate pain. Patient not taking: Reported on 03/11/2017 12/26/16   Joni Reining, PA-C    Allergies Patient has no known allergies.  Family History  Problem Relation Age of Onset   . Breast cancer Neg Hx     Social History Social History   Tobacco Use  . Smoking status: Never Smoker  . Smokeless tobacco: Never Used  Substance Use Topics  . Alcohol use: No    Alcohol/week: 0.0 oz  . Drug use: No    Review of Systems Constitutional: No fever/chills Eyes: No visual changes. ENT: No sore throat. No stiff neck no neck pain Cardiovascular: Denies chest pain. Respiratory: Denies shortness of breath. Gastrointestinal:   no vomiting.  No diarrhea.  No constipation. Genitourinary: Negative for dysuria. Musculoskeletal: Negative lower extremity swelling Skin: Negative for rash. Neurological: Negative for severe headaches, focal weakness or numbness.   ____________________________________________   PHYSICAL EXAM:  VITAL SIGNS: ED Triage Vitals  Enc Vitals Group     BP 08/06/17 1611 (!) 144/83     Pulse Rate 08/06/17 1611 69     Resp 08/06/17 1611 16     Temp 08/06/17 1611 98.1 F (36.7 C)     Temp Source 08/06/17 1611 Oral     SpO2 08/06/17 1611 100 %     Weight 08/06/17 1612 209 lb (94.8 kg)     Height 08/06/17 1612  (1.626 m)     Head Circumference --      Peak Flow --      Pain Score 08/06/17 1611 8     Pain Loc --      Pain Edu? --      Excl. in GC? --     Constitutional: Alert and oriented. Well appearing and in no acute distress. Eyes: Conjunctivae are normal Head: Atraumatic HEENT: No congestion/rhinnorhea. Mucous membranes are moist.  Oropharynx non-erythematous tenderness to palpation to the left TMJ but no evidence of dislocation erythema or infection no evidence of temporal arteritis, Neck:   There is palpation left trapezius muscle which reproduces her pain.  With no meningismus, no masses, no stridor Cardiovascular: Normal rate, regular rhythm. Grossly normal heart sounds.  Good peripheral circulation. Respiratory: Normal respiratory effort.  No retractions. Lungs CTAB. Abdominal: Soft and nontender. No distention. No guarding  no rebound Back:  There is no focal tenderness or step off.  there is no midline tenderness there are no lesions noted. there is no CVA tenderness Musculoskeletal: No lower extremity tenderness, no upper extremity tenderness. No joint effusions, no DVT signs strong distal pulses no edema Neurologic:  Normal speech and language. No gross focal neurologic deficits are appreciated.  Skin:  Skin is warm, dry and intact. No rash noted. Psychiatric: Mood and affect are normal. Speech and behavior are normal.  ____________________________________________   LABS (all labs ordered are listed, but only abnormal results are displayed)  Labs Reviewed  BASIC METABOLIC PANEL - Abnormal; Notable for the following components:      Result Value   Glucose,  Bld 105 (*)    All other components within normal limits  CBC - Abnormal; Notable for the following components:   Hemoglobin 11.7 (*)    HCT 33.8 (*)    All other components within normal limits  TROPONIN I  POC URINE PREG, ED  POCT PREGNANCY, URINE    Pertinent labs  results that were available during my care of the patient were reviewed by me and considered in my medical decision making (see chart for details). ____________________________________________  EKG  I personally interpreted any EKGs ordered by me or triage Normal sinus rhythm at 75 bpm no acute ST elevation or depression normal axis and unremarkable EKG ____________________________________________  RADIOLOGY  Pertinent labs & imaging results that were available during my care of the patient were reviewed by me and considered in my medical decision making (see chart for details). If possible, patient and/or family made aware of any abnormal findings.  Dg Chest 2 View  Result Date: 08/06/2017 CLINICAL DATA:  Chest pain EXAM: CHEST  2 VIEW COMPARISON:  Chest radiograph 07/18/2017 FINDINGS: The heart size and mediastinal contours are within normal limits. Both lungs are clear. The  visualized skeletal structures are unremarkable. IMPRESSION: Normal chest. Electronically Signed   By: Deatra RobinsonKevin  Herman M.D.   On: 08/06/2017 16:44   ____________________________________________    PROCEDURES  Procedure(s) performed: None  Procedures  Critical Care performed: None  ____________________________________________   INITIAL IMPRESSION / ASSESSMENT AND PLAN / ED COURSE  Pertinent labs & imaging results that were available during my care of the patient were reviewed by me and considered in my medical decision making (see chart for details).  Here with very reproducible pain in her left trapezius muscle, at this time, there does not appear to be clinical evidence to support the diagnosis of pulmonary embolus, dissection, myocarditis, endocarditis, pericarditis, pericardial tamponade, acute coronary syndrome, pneumothorax, pneumonia, or any other acute intrathoracic pathology that will require admission or acute intervention. Nor is there evidence of any significant intra-abdominal pathology causing this discomfort.    ____________________________________________   FINAL CLINICAL IMPRESSION(S) / ED DIAGNOSES  Final diagnoses:  None      This chart was dictated using voice recognition software.  Despite best efforts to proofread,  errors can occur which can change meaning.      Jeanmarie PlantMcShane, James A, MD 08/06/17 [id number]

## 2017-08-06 NOTE — ED Notes (Signed)
Pt c/o LFT shoulder and neck pain upon awakening this am, denies any injury. Pt ambulatory, denies any CP or SOB

## 2017-09-06 DIAGNOSIS — K208 Other esophagitis without bleeding: Secondary | ICD-10-CM | POA: Insufficient documentation

## 2017-09-06 DIAGNOSIS — K222 Esophageal obstruction: Secondary | ICD-10-CM | POA: Insufficient documentation

## 2017-09-06 DIAGNOSIS — K449 Diaphragmatic hernia without obstruction or gangrene: Secondary | ICD-10-CM | POA: Insufficient documentation

## 2017-10-11 ENCOUNTER — Encounter: Payer: Self-pay | Admitting: Emergency Medicine

## 2017-10-11 ENCOUNTER — Other Ambulatory Visit: Payer: Self-pay

## 2017-10-11 ENCOUNTER — Emergency Department
Admission: EM | Admit: 2017-10-11 | Discharge: 2017-10-11 | Disposition: A | Discharge status: Home / Self Care | Payer: Medicaid Other | Attending: Emergency Medicine | Admitting: Emergency Medicine

## 2017-10-11 ENCOUNTER — Emergency Department: Payer: Medicaid Other

## 2017-10-11 DIAGNOSIS — M25512 Pain in left shoulder: Secondary | ICD-10-CM | POA: Insufficient documentation

## 2017-10-11 DIAGNOSIS — Z5321 Procedure and treatment not carried out due to patient leaving prior to being seen by health care provider: Secondary | ICD-10-CM | POA: Insufficient documentation

## 2017-10-11 HISTORY — DX: Gastritis, unspecified, without bleeding: K29.70

## 2017-10-11 LAB — CBC
HCT: 31.2 % — ABNORMAL LOW (ref 35.0–47.0)
Hemoglobin: 10.5 g/dL — ABNORMAL LOW (ref 12.0–16.0)
MCH: 27.6 pg (ref 26.0–34.0)
MCHC: 33.8 g/dL (ref 32.0–36.0)
MCV: 81.8 fL (ref 80.0–100.0)
Platelets: 326 10*3/uL (ref 150–440)
RBC: 3.81 MIL/uL (ref 3.80–5.20)
RDW: 15.3 % — ABNORMAL HIGH (ref 11.5–14.5)
WBC: 5.1 10*3/uL (ref 3.6–11.0)

## 2017-10-11 LAB — BASIC METABOLIC PANEL
Anion gap: 7 (ref 5–15)
BUN: 10 mg/dL (ref 6–20)
CO2: 25 mmol/L (ref 22–32)
Calcium: 8.6 mg/dL — ABNORMAL LOW (ref 8.9–10.3)
Chloride: 106 mmol/L (ref 101–111)
Creatinine, Ser: 0.75 mg/dL (ref 0.44–1.00)
GFR calc Af Amer: >60 mL/min (ref >60)
GFR calc non Af Amer: >60 mL/min (ref >60)
Glucose, Bld: 97 mg/dL (ref 65–99)
Potassium: 3.5 mmol/L (ref 3.5–5.1)
Sodium: 138 mmol/L (ref 135–145)

## 2017-10-11 LAB — TROPONIN I: Troponin I: <0.03 ng/mL (ref <0.03)

## 2017-10-11 LAB — POC URINE PREG, ED: Preg Test, Ur: NEGATIVE

## 2017-10-11 IMAGING — CR DG CHEST 2V
2 series · 2 of 2 positions shown · non-contrast
Comparison: [DATE]

CLINICAL DATA: Chest pain radiating to the left side starting
earlier today.

EXAM:
CHEST  2 VIEW

[chest pa]
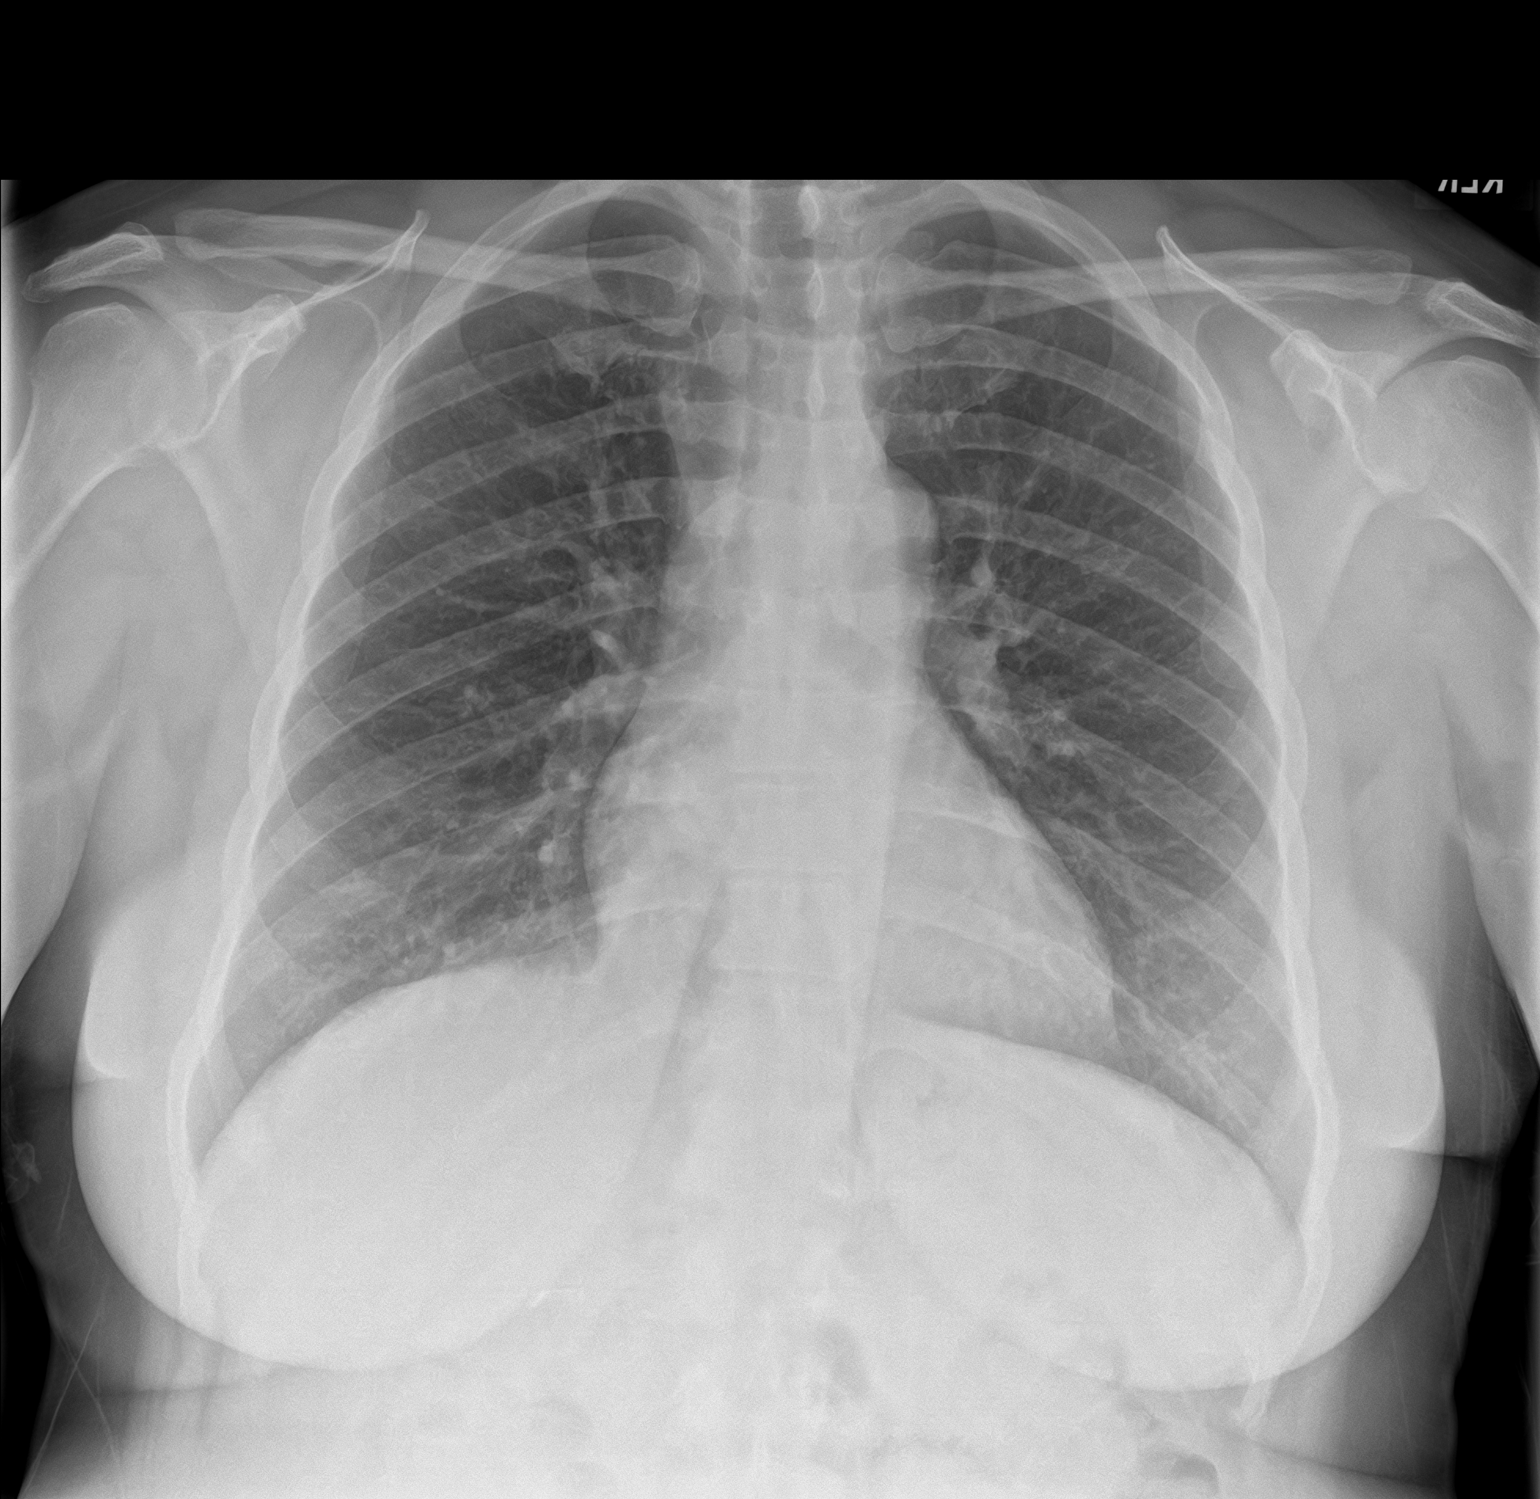

[chest lat]
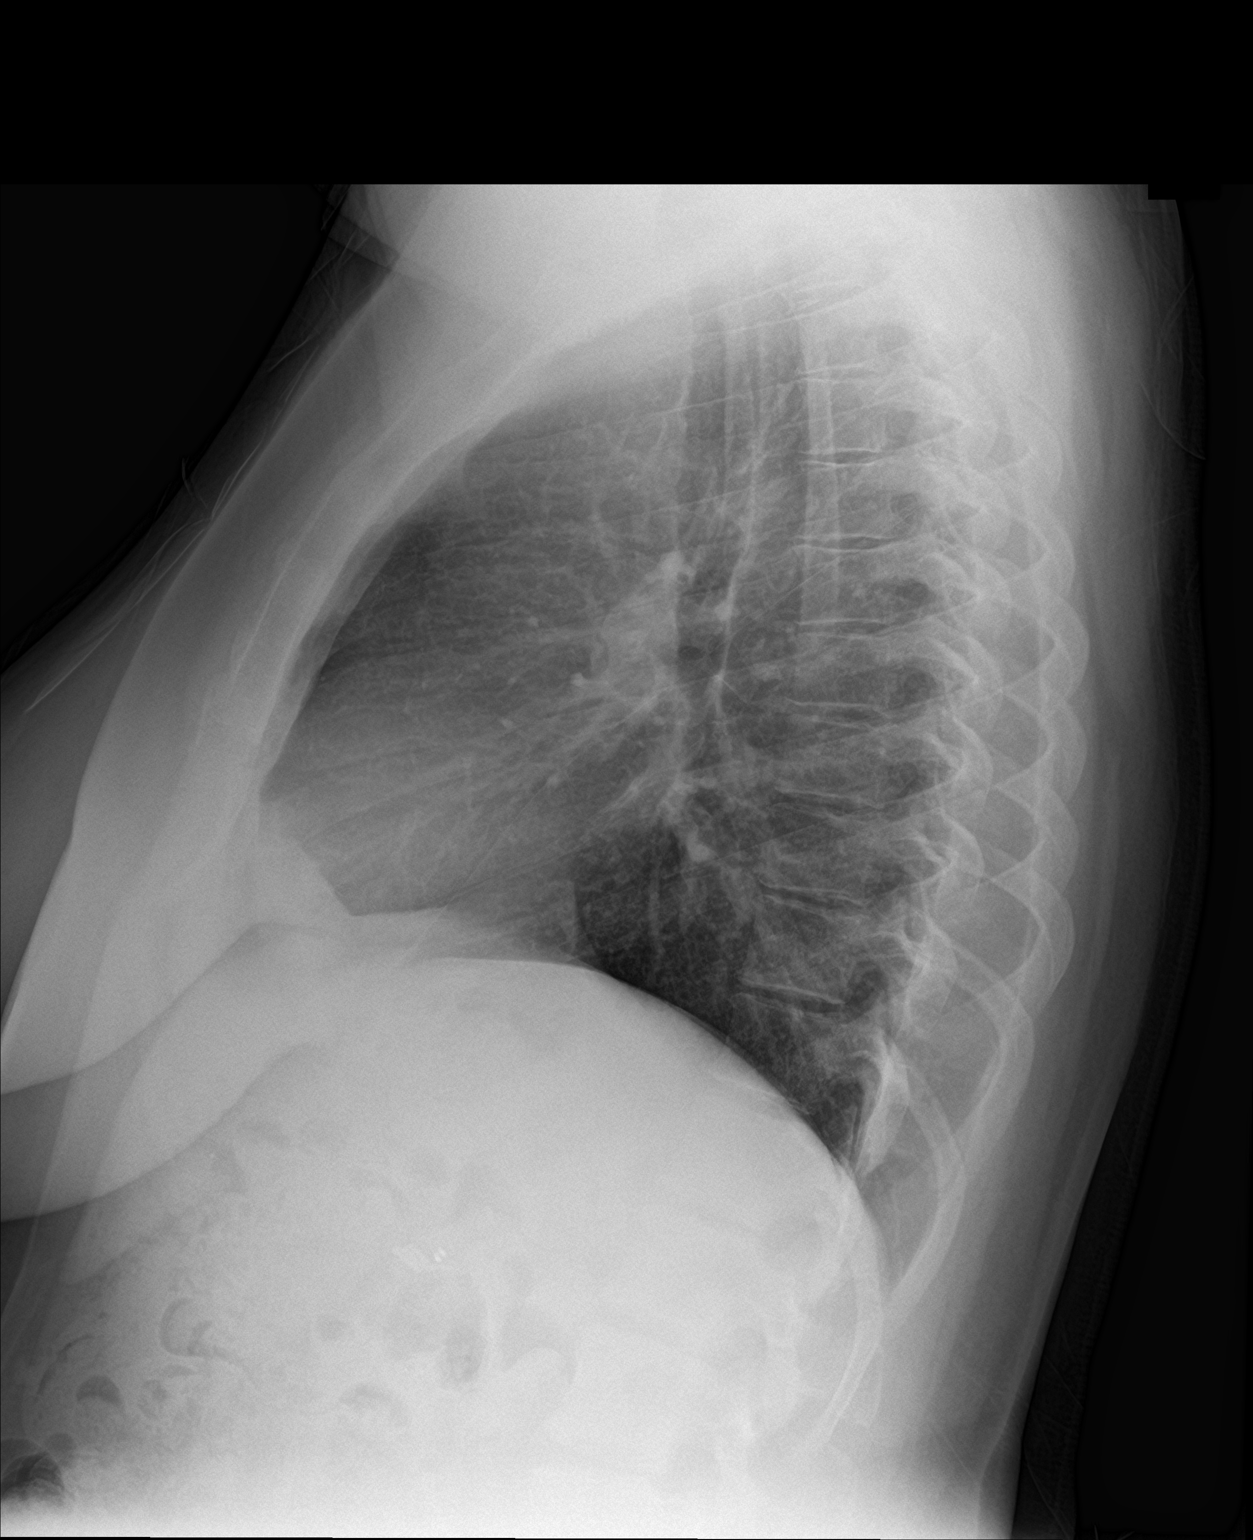

[2 of 2 positions shown; findings below may reference images not displayed]

FINDINGS: The heart size and mediastinal contours are within normal limits.
Both lungs are clear. The visualized skeletal structures are
unremarkable.
IMPRESSION: Stable normal appearance of the chest.

## 2017-10-11 NOTE — ED Triage Notes (Signed)
Pt to ED c/o left shoulder pain radiating from left elbow to left chest and back.  States pain is sharp in chest and aching in arm.  Denies SOB, n/v/d.  States pain started several hours ago while washing dishes.

## 2017-10-11 NOTE — ED Notes (Signed)
Patient called in lobby with no response.   

## 2017-10-11 NOTE — ED Notes (Signed)
Pt called in the lobby. No answer.

## 2018-02-07 ENCOUNTER — Other Ambulatory Visit: Payer: Self-pay

## 2018-02-07 ENCOUNTER — Emergency Department: Payer: Medicaid Other

## 2018-02-07 ENCOUNTER — Encounter: Payer: Self-pay | Admitting: Emergency Medicine

## 2018-02-07 ENCOUNTER — Emergency Department
Admission: EM | Admit: 2018-02-07 | Discharge: 2018-02-07 | Disposition: A | Discharge status: Home / Self Care | Payer: Medicaid Other | Attending: Emergency Medicine | Admitting: Emergency Medicine

## 2018-02-07 DIAGNOSIS — R079 Chest pain, unspecified: Secondary | ICD-10-CM | POA: Insufficient documentation

## 2018-02-07 DIAGNOSIS — Z5321 Procedure and treatment not carried out due to patient leaving prior to being seen by health care provider: Secondary | ICD-10-CM | POA: Insufficient documentation

## 2018-02-07 LAB — BASIC METABOLIC PANEL
Anion gap: 7 (ref 5–15)
BUN: 9 mg/dL (ref 6–20)
CO2: 25 mmol/L (ref 22–32)
Calcium: 9 mg/dL (ref 8.9–10.3)
Chloride: 106 mmol/L (ref 101–111)
Creatinine, Ser: 0.65 mg/dL (ref 0.44–1.00)
GFR calc Af Amer: >60 mL/min (ref >60)
GFR calc non Af Amer: >60 mL/min (ref >60)
Glucose, Bld: 97 mg/dL (ref 65–99)
Potassium: 3.8 mmol/L (ref 3.5–5.1)
Sodium: 138 mmol/L (ref 135–145)

## 2018-02-07 LAB — TROPONIN I: Troponin I: <0.03 ng/mL (ref <0.03)

## 2018-02-07 LAB — CBC
HCT: 34.4 % — ABNORMAL LOW (ref 35.0–47.0)
Hemoglobin: 11.6 g/dL — ABNORMAL LOW (ref 12.0–16.0)
MCH: 27.3 pg (ref 26.0–34.0)
MCHC: 33.7 g/dL (ref 32.0–36.0)
MCV: 80.9 fL (ref 80.0–100.0)
Platelets: 316 10*3/uL (ref 150–440)
RBC: 4.25 MIL/uL (ref 3.80–5.20)
RDW: 15.2 % — ABNORMAL HIGH (ref 11.5–14.5)
WBC: 4.4 10*3/uL (ref 3.6–11.0)

## 2018-02-07 LAB — POC URINE PREG, ED: Preg Test, Ur: NEGATIVE

## 2018-02-07 NOTE — ED Notes (Signed)
X-ray unable to locate pt for cxr

## 2018-02-07 NOTE — ED Notes (Signed)
Pt not found in lobby 

## 2018-02-07 NOTE — ED Triage Notes (Signed)
Pt to ED from home c/o left chest pain radiating to back, with SOB denies nausea or hx of same.  States started approx. 1430 today while walking as sharp pain.  Pt ambulatory with steady gait, chest rise even and unlabored, skin warm and dry, no acute distress noted at this time.

## 2018-02-10 ENCOUNTER — Telehealth: Payer: Self-pay | Admitting: Emergency Medicine

## 2018-02-10 NOTE — Telephone Encounter (Signed)
Called patient due to lwot to inquire about condition and follow up plans. Left message.   

## 2018-03-23 ENCOUNTER — Encounter: Payer: Self-pay | Admitting: Emergency Medicine

## 2018-03-23 ENCOUNTER — Other Ambulatory Visit: Payer: Self-pay

## 2018-03-23 DIAGNOSIS — R51 Headache: Secondary | ICD-10-CM | POA: Insufficient documentation

## 2018-03-23 DIAGNOSIS — R42 Dizziness and giddiness: Secondary | ICD-10-CM | POA: Insufficient documentation

## 2018-03-23 DIAGNOSIS — Z5321 Procedure and treatment not carried out due to patient leaving prior to being seen by health care provider: Secondary | ICD-10-CM | POA: Insufficient documentation

## 2018-03-23 NOTE — ED Triage Notes (Addendum)
Pt reports headache to both sides of her head and dizziness; says dizziness started about 2 hours; nausea but no vomiting; denies history of either; slight drift to left arm; slight flattening of folds to left side of mouth; slightly weaker grip on left side;

## 2018-03-24 ENCOUNTER — Emergency Department: Payer: Medicaid Other

## 2018-03-24 ENCOUNTER — Emergency Department
Admission: EM | Admit: 2018-03-24 | Discharge: 2018-03-24 | Disposition: A | Discharge status: Home / Self Care | Payer: Medicaid Other | Attending: Emergency Medicine | Admitting: Emergency Medicine

## 2018-03-24 DIAGNOSIS — R51 Headache: Secondary | ICD-10-CM | POA: Diagnosis not present

## 2018-03-24 LAB — CBC
HCT: 30.1 % — ABNORMAL LOW (ref 35.0–47.0)
Hemoglobin: 10.8 g/dL — ABNORMAL LOW (ref 12.0–16.0)
MCH: 29.3 pg (ref 26.0–34.0)
MCHC: 36 g/dL (ref 32.0–36.0)
MCV: 81.3 fL (ref 80.0–100.0)
Platelets: 294 10*3/uL (ref 150–440)
RBC: 3.7 MIL/uL — ABNORMAL LOW (ref 3.80–5.20)
RDW: 15.3 % — ABNORMAL HIGH (ref 11.5–14.5)
WBC: 6.6 10*3/uL (ref 3.6–11.0)

## 2018-03-24 LAB — DIFFERENTIAL
Basophils Absolute: 0.1 10*3/uL (ref 0–0.1)
Basophils Relative: 1 %
Eosinophils Absolute: 0.2 10*3/uL (ref 0–0.7)
Eosinophils Relative: 2 %
Lymphocytes Relative: 45 %
Lymphs Abs: 3 10*3/uL (ref 1.0–3.6)
Monocytes Absolute: 0.4 10*3/uL (ref 0.2–0.9)
Monocytes Relative: 7 %
Neutro Abs: 3 10*3/uL (ref 1.4–6.5)
Neutrophils Relative %: 45 %

## 2018-03-24 LAB — COMPREHENSIVE METABOLIC PANEL
ALT: 21 U/L (ref 0–44)
AST: 19 U/L (ref 15–41)
Albumin: 4.1 g/dL (ref 3.5–5.0)
Alkaline Phosphatase: 33 U/L — ABNORMAL LOW (ref 38–126)
Anion gap: 6 (ref 5–15)
BUN: 11 mg/dL (ref 6–20)
CO2: 27 mmol/L (ref 22–32)
Calcium: 8.9 mg/dL (ref 8.9–10.3)
Chloride: 106 mmol/L (ref 98–111)
Creatinine, Ser: 0.72 mg/dL (ref 0.44–1.00)
GFR calc Af Amer: >60 mL/min (ref >60)
GFR calc non Af Amer: >60 mL/min (ref >60)
Glucose, Bld: 106 mg/dL — ABNORMAL HIGH (ref 70–99)
Potassium: 3.8 mmol/L (ref 3.5–5.1)
Sodium: 139 mmol/L (ref 135–145)
Total Bilirubin: 0.6 mg/dL (ref 0.3–1.2)
Total Protein: 7.4 g/dL (ref 6.5–8.1)

## 2018-03-24 LAB — PROTIME-INR
INR: 0.97
Prothrombin Time: 12.8 seconds (ref 11.4–15.2)

## 2018-03-24 LAB — TROPONIN I: Troponin I: <0.03 ng/mL (ref <0.03)

## 2018-03-24 LAB — APTT: aPTT: 29 seconds (ref 24–36)

## 2018-03-24 IMAGING — CT CT HEAD W/O CM
3 series · 16 of 45 positions shown, 19 images · non-contrast
Comparison: Head CT [DATE]

CLINICAL DATA: Focal neuro deficit, < 6 hrs, stroke suspected.
Patient reports headache and dizziness.

EXAM:
CT HEAD WITHOUT CONTRAST
TECHNIQUE: Contiguous axial images were obtained from the base of the skull
through the vertex without intravenous contrast.

[Series 2: head wo · axial · 0.38mm/px · z∈[+325,+440]mm · 10 of 28 slices shown, 13 images]
[im 3/28  brain]
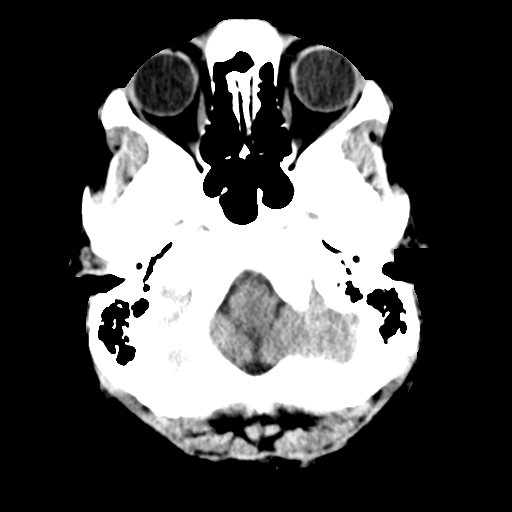
[im 3/28  bone]
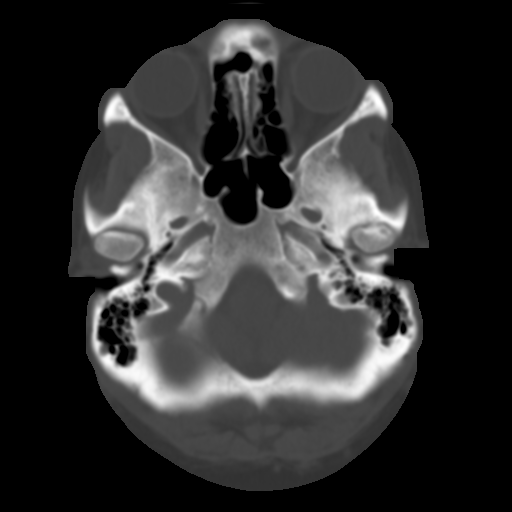
[im 5/28  brain]
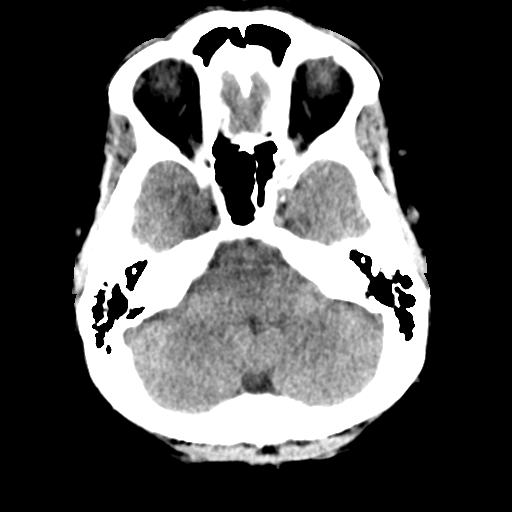
[im 8/28  brain]
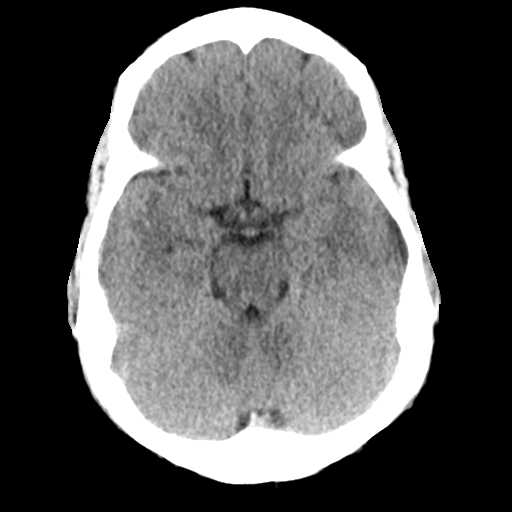
[im 11/28  brain]
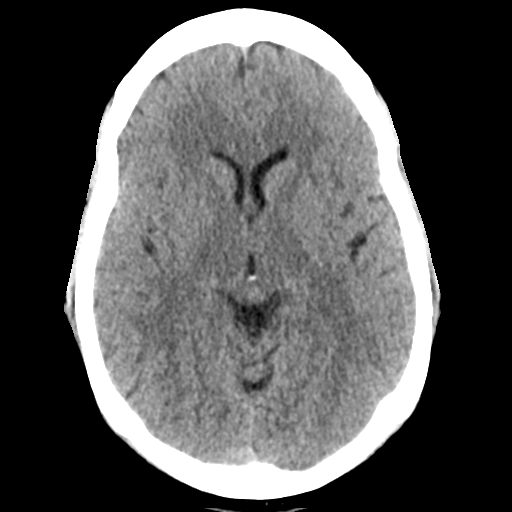
[im 13/28  brain]
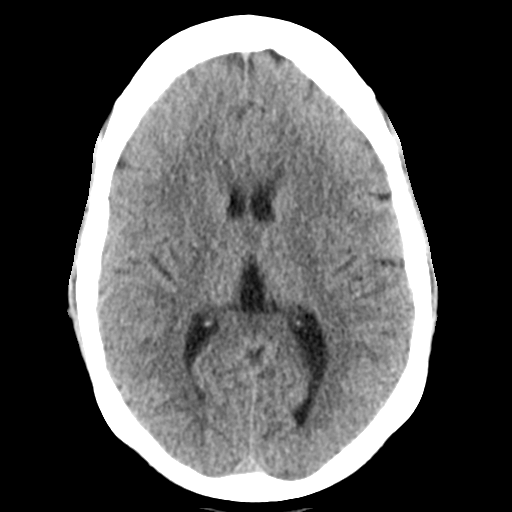
[im 13/28  bone]
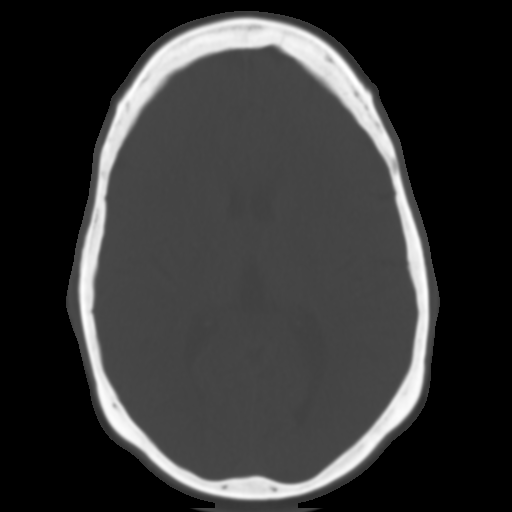
[im 16/28  brain]
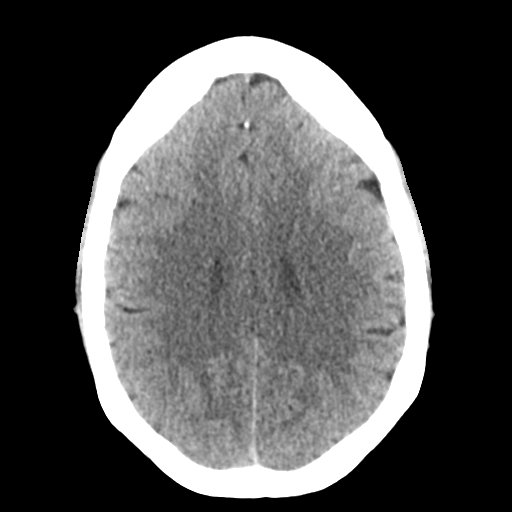
[im 18/28  brain]
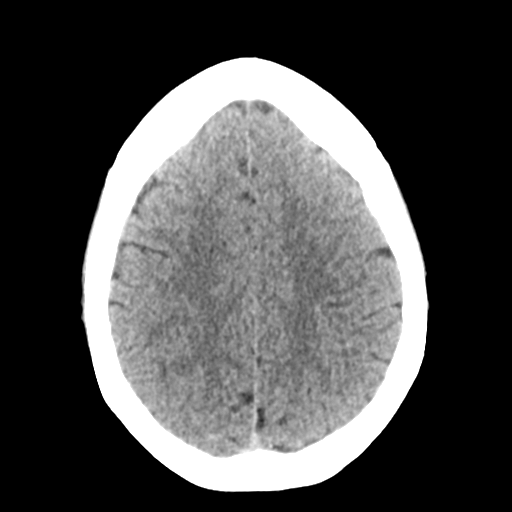
[im 21/28  brain]
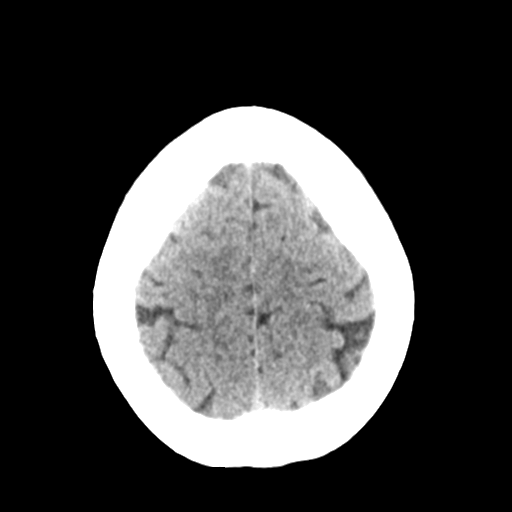
[im 24/28  brain]
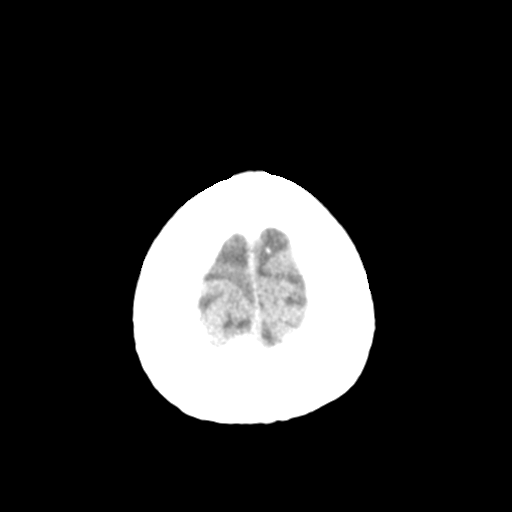
[im 24/28  bone]
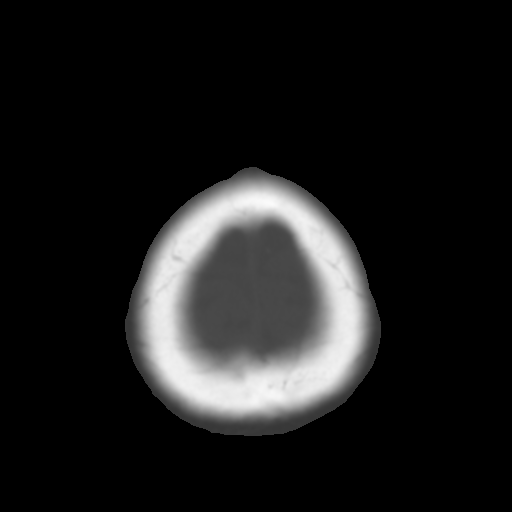
[im 26/28  brain]
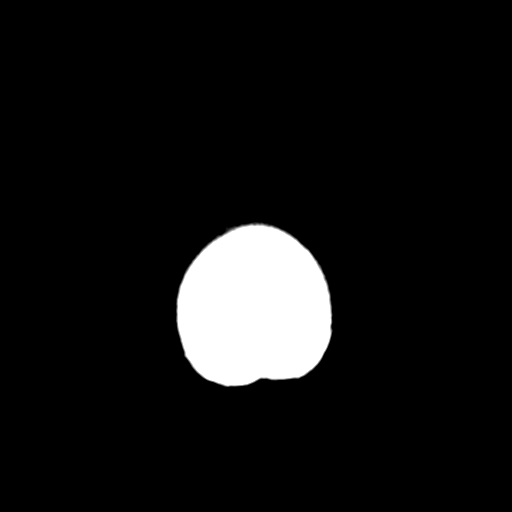

[Series 4: coronal soft tissue · coronal · 0.28mm/px · 3 of 65 slices shown]
[im 22/65  brain]
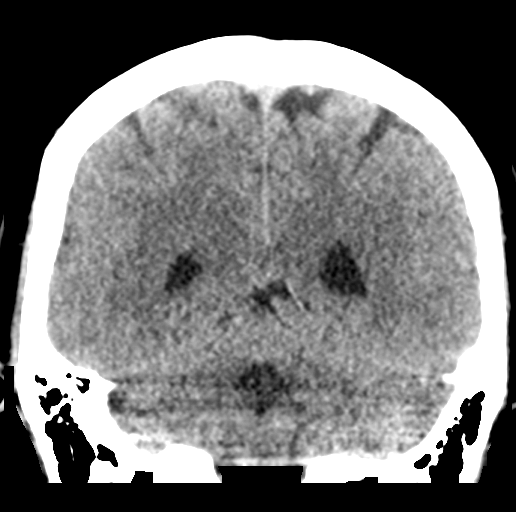
[im 29/65  brain]
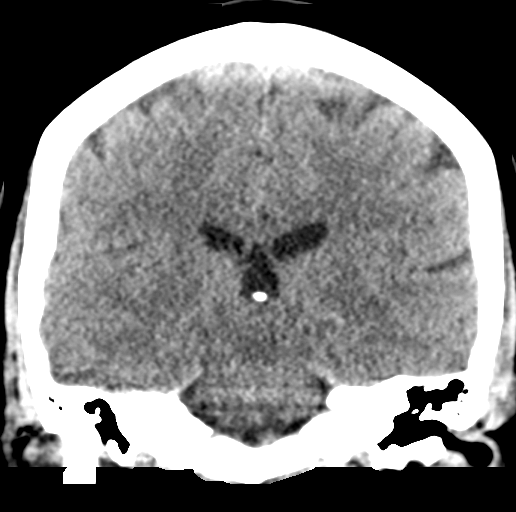
[im 36/65  brain]
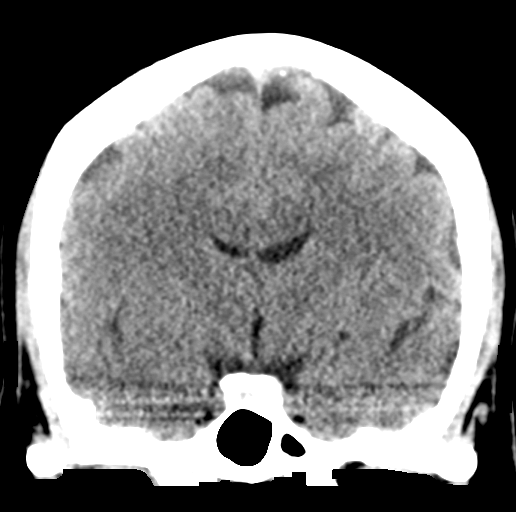

[Series 5: sagittal soft tissue · sagittal · 0.28mm/px · 3 of 49 slices shown]
[im 17/49  brain]
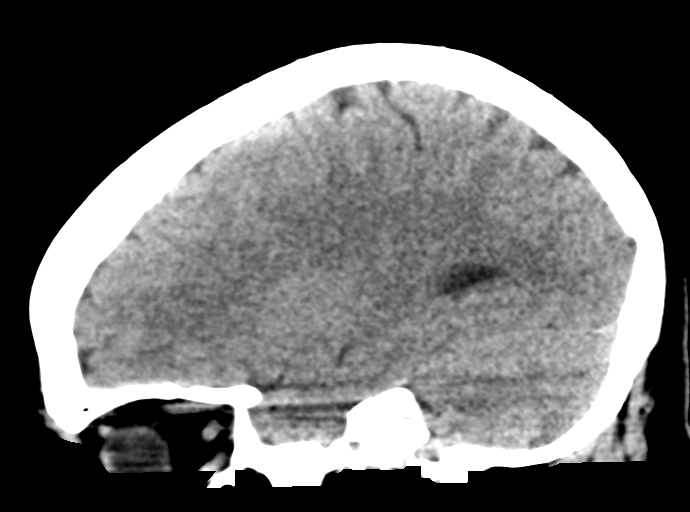
[im 25/49  brain]
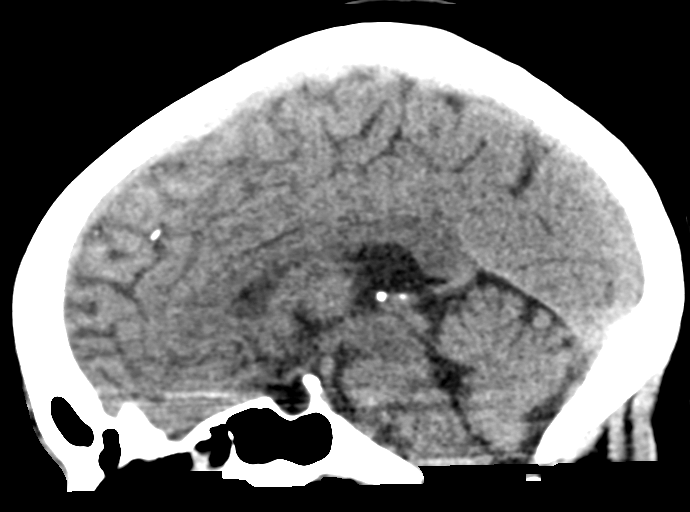
[im 33/49  brain]
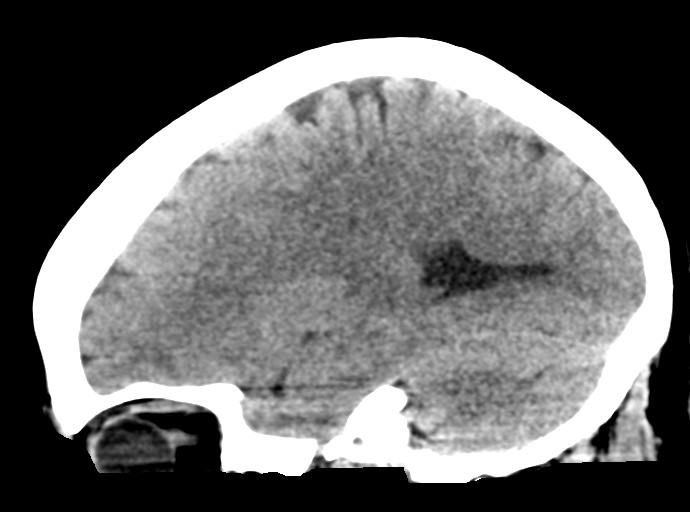

[16 of 45 positions shown; findings below may reference images not displayed]

FINDINGS: Brain: No intracranial hemorrhage, mass effect, or midline shift. No
hydrocephalus. The basilar cisterns are patent. No evidence of
territorial infarct or acute ischemia. No extra-axial or
intracranial fluid collection.

Vascular: No hyperdense vessel or unexpected calcification.

Skull: No fracture or focal lesion.

Sinuses/Orbits: Paranasal sinuses and mastoid air cells are clear.
The visualized orbits are unremarkable.

Other: None.
IMPRESSION: Negative head CT.

## 2018-03-24 NOTE — ED Notes (Signed)
Pt stated to registration that they were leaving. Pt already left ED when this nurse back to front desk from taking another pt back. Pt seen walking through parking lot with steady gait and in NAD.

## 2018-03-24 NOTE — ED Notes (Signed)
Spoke with Dr York CeriseForbach regarding finding and orders placed; acknowledged

## 2018-03-24 NOTE — ED Notes (Signed)
CT called and notified of ordered scan

## 2018-05-05 ENCOUNTER — Encounter: Payer: Self-pay | Admitting: Emergency Medicine

## 2018-05-05 ENCOUNTER — Emergency Department: Payer: Medicaid Other

## 2018-05-05 ENCOUNTER — Emergency Department
Admission: EM | Admit: 2018-05-05 | Discharge: 2018-05-05 | Disposition: A | Discharge status: Home / Self Care | Payer: Medicaid Other | Attending: Student in an Organized Health Care Education/Training Program | Admitting: Student in an Organized Health Care Education/Training Program

## 2018-05-05 DIAGNOSIS — Z79899 Other long term (current) drug therapy: Secondary | ICD-10-CM | POA: Insufficient documentation

## 2018-05-05 DIAGNOSIS — R109 Unspecified abdominal pain: Secondary | ICD-10-CM | POA: Diagnosis not present

## 2018-05-05 DIAGNOSIS — R1032 Left lower quadrant pain: Secondary | ICD-10-CM | POA: Insufficient documentation

## 2018-05-05 DIAGNOSIS — E039 Hypothyroidism, unspecified: Secondary | ICD-10-CM | POA: Diagnosis not present

## 2018-05-05 LAB — BASIC METABOLIC PANEL
Anion gap: 7 (ref 5–15)
BUN: 6 mg/dL (ref 6–20)
CO2: 23 mmol/L (ref 22–32)
Calcium: 8.9 mg/dL (ref 8.9–10.3)
Chloride: 108 mmol/L (ref 98–111)
Creatinine, Ser: 0.67 mg/dL (ref 0.44–1.00)
GFR calc Af Amer: >60 mL/min (ref >60)
GFR calc non Af Amer: >60 mL/min (ref >60)
Glucose, Bld: 93 mg/dL (ref 70–99)
Potassium: 3.7 mmol/L (ref 3.5–5.1)
Sodium: 138 mmol/L (ref 135–145)

## 2018-05-05 LAB — URINALYSIS, COMPLETE (UACMP) WITH MICROSCOPIC
Bacteria, UA: NONE SEEN
Bilirubin Urine: NEGATIVE
Glucose, UA: NEGATIVE mg/dL
Hgb urine dipstick: NEGATIVE
Ketones, ur: NEGATIVE mg/dL
Leukocytes, UA: NEGATIVE
Nitrite: NEGATIVE
Protein, ur: NEGATIVE mg/dL
Specific Gravity, Urine: 1.005 (ref 1.005–1.030)
WBC, UA: NONE SEEN WBC/hpf (ref 0–5)
pH: 8 (ref 5.0–8.0)

## 2018-05-05 LAB — CBC
HCT: 32.9 % — ABNORMAL LOW (ref 35.0–47.0)
Hemoglobin: 11.5 g/dL — ABNORMAL LOW (ref 12.0–16.0)
MCH: 28.5 pg (ref 26.0–34.0)
MCHC: 34.9 g/dL (ref 32.0–36.0)
MCV: 81.8 fL (ref 80.0–100.0)
Platelets: 316 10*3/uL (ref 150–440)
RBC: 4.02 MIL/uL (ref 3.80–5.20)
RDW: 15.7 % — ABNORMAL HIGH (ref 11.5–14.5)
WBC: 6.1 10*3/uL (ref 3.6–11.0)

## 2018-05-05 LAB — FIBRIN DERIVATIVES D-DIMER (ARMC ONLY): Fibrin derivatives D-dimer (ARMC): 280.03 ng/mL (FEU) (ref 0.00–499.00)

## 2018-05-05 LAB — PREGNANCY, URINE: Preg Test, Ur: NEGATIVE

## 2018-05-05 IMAGING — CT CT RENAL STONE PROTOCOL
2 of 4 series · 16 of 46 positions shown, 18 images · non-contrast
Comparison: [DATE]

CLINICAL DATA: Left flank pain

EXAM:
CT ABDOMEN AND PELVIS WITHOUT CONTRAST
TECHNIQUE: Multidetector CT imaging of the abdomen and pelvis was performed
following the standard protocol without IV contrast.

[Series 2: stone full standard · axial · 0.68mm/px · z∈[-942,-517]mm · 13 of 93 slices shown, 15 images]
[im 4/93  soft-tissue]
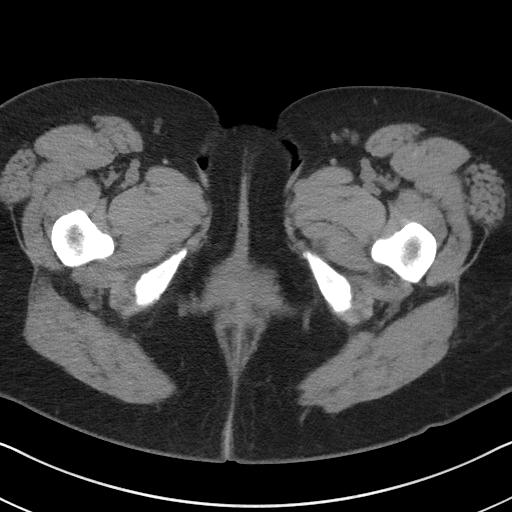
[im 4/93  bone]
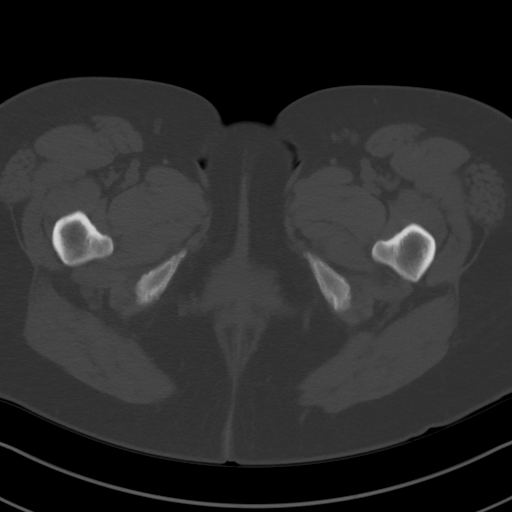
[im 12/93  soft-tissue]
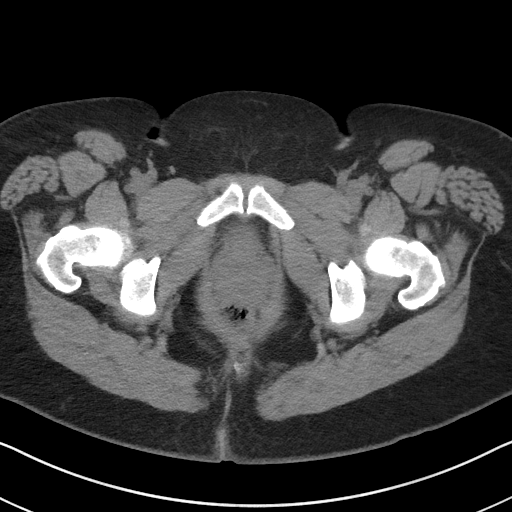
[im 19/93  soft-tissue]
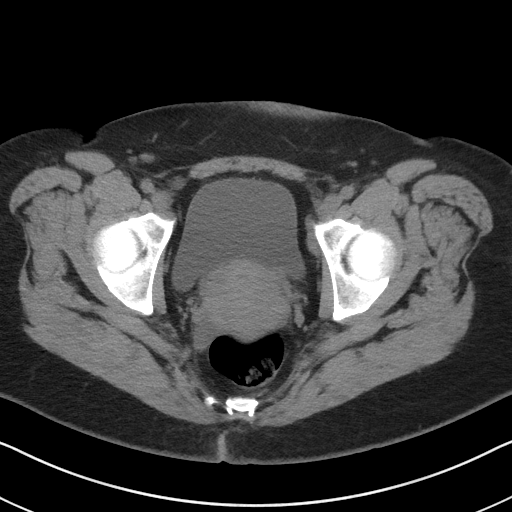
[im 26/93  soft-tissue]
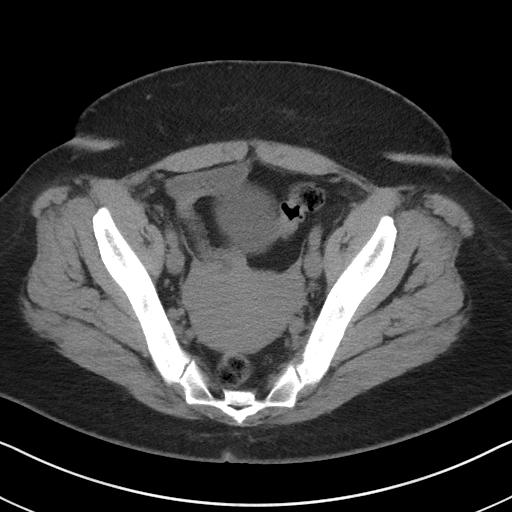
[im 34/93  soft-tissue]
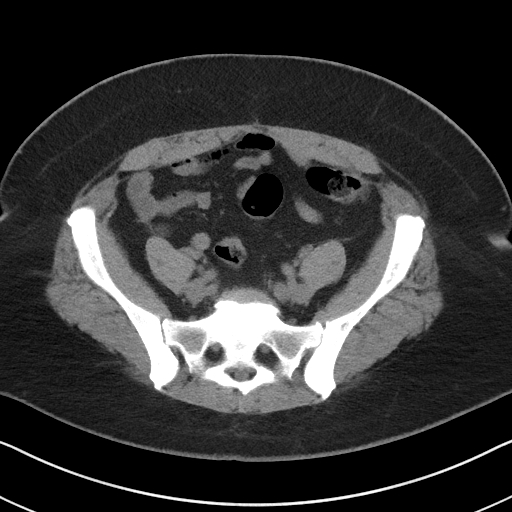
[im 41/93  soft-tissue]
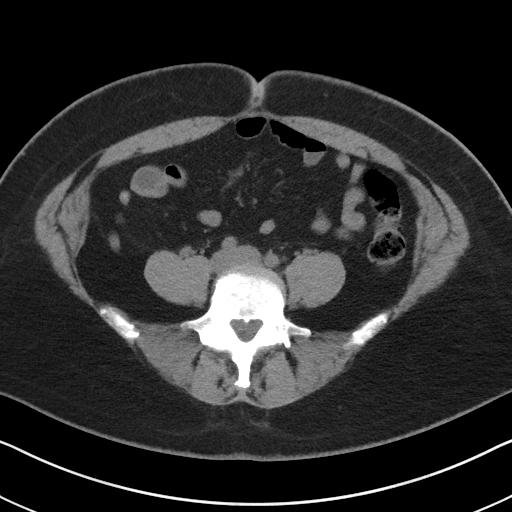
[im 48/93  soft-tissue]
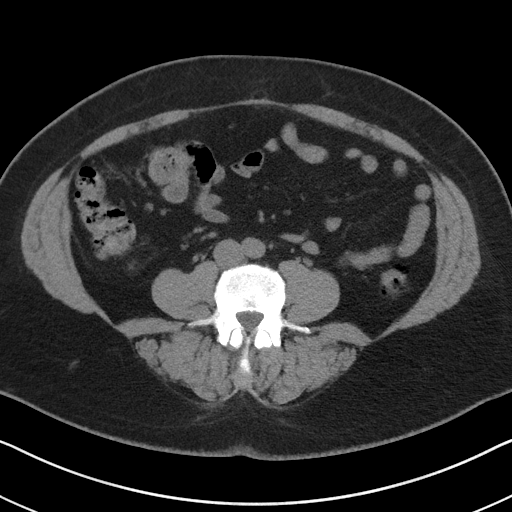
[im 52/93  soft-tissue]
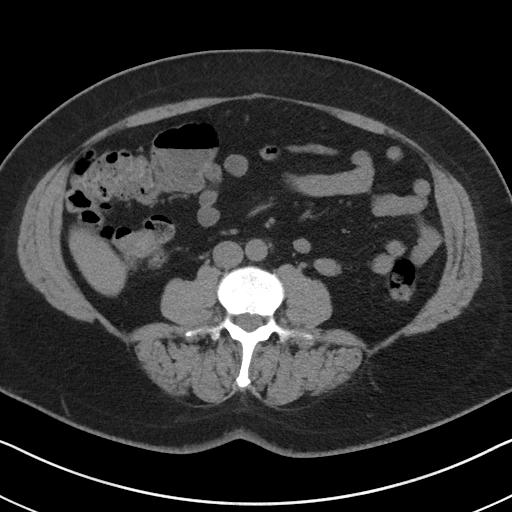
[im 59/93  soft-tissue]
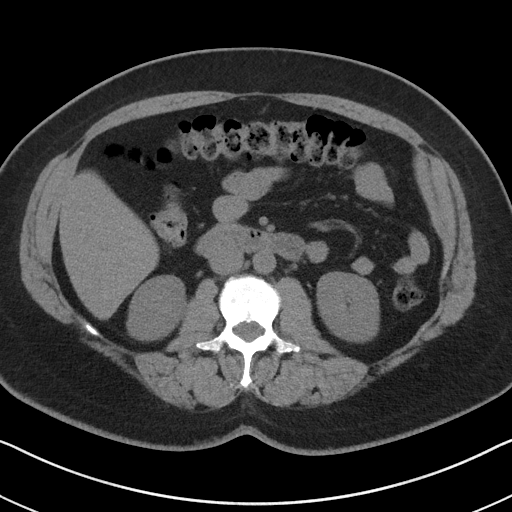
[im 59/93  bone]
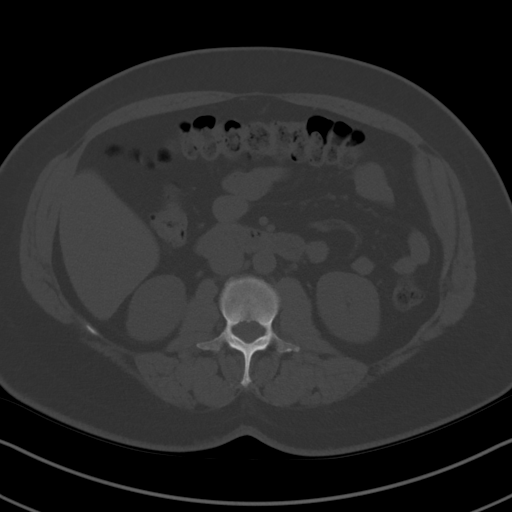
[im 67/93  soft-tissue]
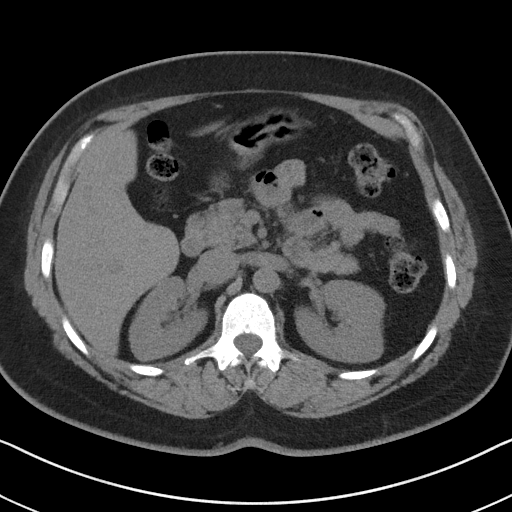
[im 74/93  soft-tissue]
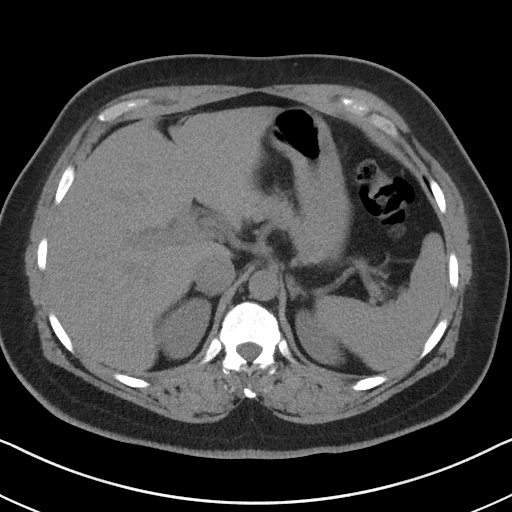
[im 81/93  soft-tissue]
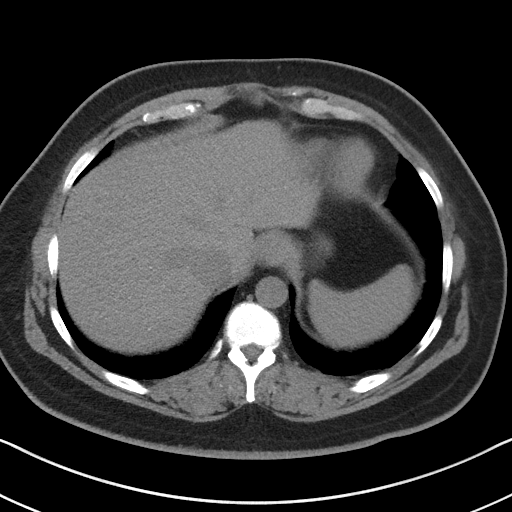
[im 89/93  soft-tissue]
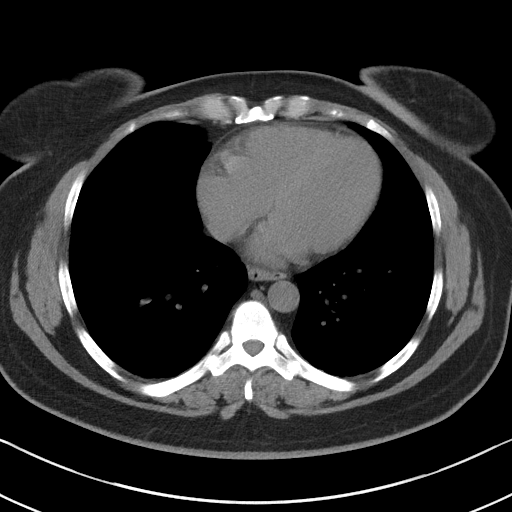

[Series 5: coronal · coronal · 0.72mm/px · 3 of 116 slices shown]
[im 39/116  soft-tissue]
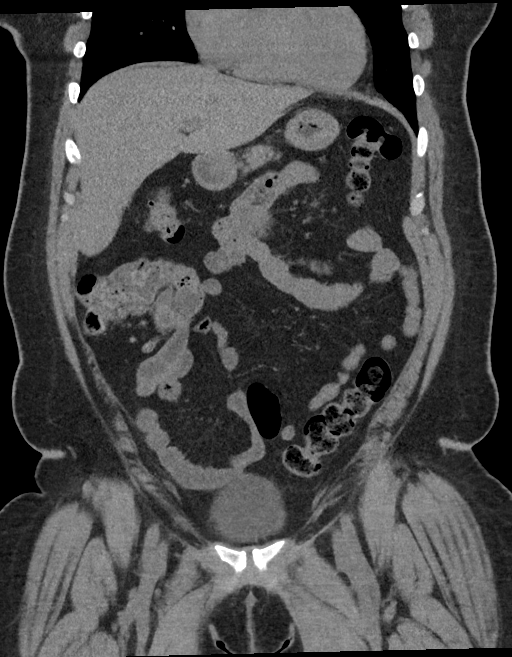
[im 52/116  soft-tissue]
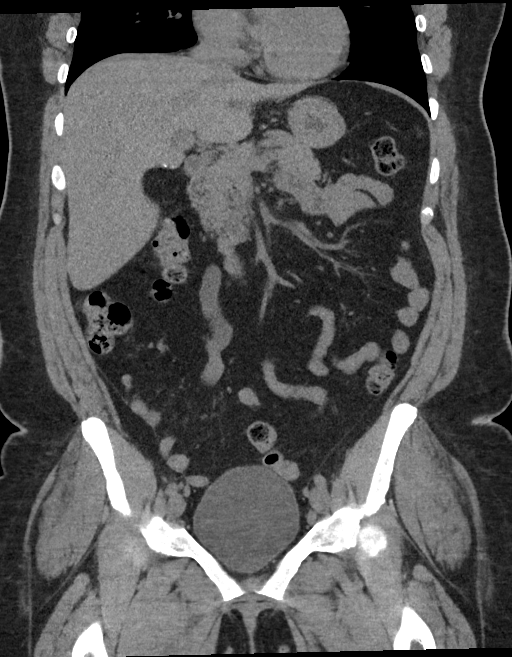
[im 64/116  soft-tissue]
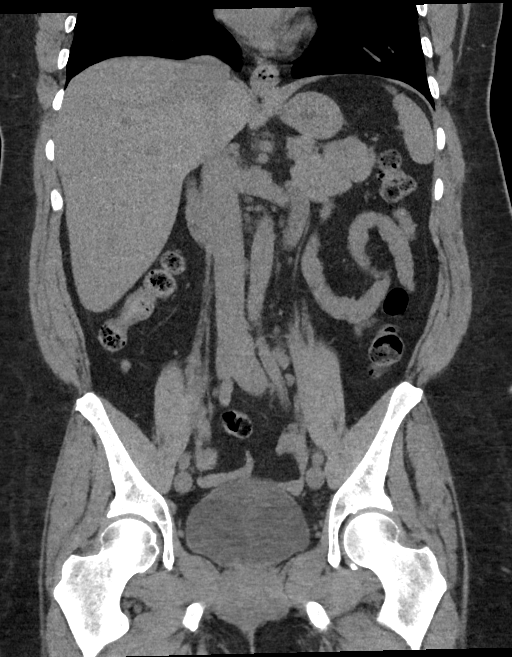

[16 of 46 positions shown; findings below may reference images not displayed]

FINDINGS: Lower chest: Lung bases are clear. No effusions. Heart is normal
size.

Hepatobiliary: No focal liver abnormality is seen. Status post
cholecystectomy. No biliary dilatation.

Pancreas: No focal abnormality or ductal dilatation.

Spleen: No focal abnormality.  Normal size.

Adrenals/Urinary Tract: No adrenal abnormality. No focal renal
abnormality. No stones or hydronephrosis. Urinary bladder is
unremarkable.

Stomach/Bowel: Normal appendix Stomach, large and small bowel
grossly unremarkable.

Vascular/Lymphatic: No evidence of aneurysm or adenopathy.

Reproductive: Uterus and adnexa unremarkable. No mass. Small cyst or
follicle within the left ovary.

Other: No free fluid or free air.

Musculoskeletal: No acute bony abnormality.
IMPRESSION: No renal or ureteral stones.  No hydronephrosis.

No acute findings in the abdomen or pelvis.

## 2018-05-05 MED ORDER — KETOROLAC TROMETHAMINE 30 MG/ML IJ SOLN
15.0000 mg | Freq: Once | INTRAMUSCULAR | Status: AC
  Administered 2018-05-05: 15 mg via INTRAVENOUS
  Filled 2018-05-05: qty 1

## 2018-05-05 MED ORDER — HYDROCODONE-ACETAMINOPHEN 5-325 MG PO TABS: 1.0000 | ORAL_TABLET | ORAL | 0 refills | Status: DC | PRN

## 2018-05-05 MED ORDER — PROMETHAZINE HCL 25 MG/ML IJ SOLN
12.5000 mg | Freq: Four times a day (QID) | INTRAMUSCULAR | Status: DC | PRN
  Administered 2018-05-05: 12.5 mg via INTRAVENOUS
  Filled 2018-05-05: qty 1

## 2018-05-05 MED ORDER — MORPHINE SULFATE (PF) 4 MG/ML IV SOLN
4.0000 mg | INTRAVENOUS | Status: DC | PRN
  Administered 2018-05-05: 4 mg via INTRAVENOUS
  Filled 2018-05-05: qty 1

## 2018-05-05 NOTE — ED Triage Notes (Signed)
Patient presents to the ED with left flank pain several hours pta, suddenly.  Patient appears very uncomfortable during triage.  Patient denies history of kidney stones.  Denies dysuria.

## 2018-05-05 NOTE — Discharge Instructions (Addendum)

## 2018-05-05 NOTE — ED Provider Notes (Signed)
[facility] Emergency Department Provider Note    First MD Initiated Contact with Patient 05/05/18 1727     (approximate)  I have reviewed the triage vital signs and the nursing notes.   HISTORY  Chief Complaint Flank Pain    HPI Morgan Stevens is a 52 y.o. female the history of gastritis as well as hiatal hernia obesity presents the ER with chief complaint of sudden onset left flank pain.  States that she was doing chores in her house and felt the pain occurred suddenly.  There was associated nausea but no vomiting.  No fevers.  Is never had pain like this before.  Denies any trauma.  No radiation of the pain.  States it does hurt when she takes a deep breath.  Denies any shortness of breath or chest pain.  Denies any hematuria.  No vaginal bleeding or discharge.    Past Medical History:  Diagnosis Date  . Gastritis   . Hiatal hernia   . Obesity (BMI 30-39.9)   . Reflux   . Thyroid disease    Family History  Problem Relation Age of Onset  . Breast cancer Neg Hx    Past Surgical History:  Procedure Laterality Date  . CARDIAC CATHETERIZATION  02/27/2016   Procedure: Left Heart Cath and Coronary Angiography;  Surgeon: Corky Crafts, MD;  Location: [facility] INVASIVE CV LAB;  Service: Cardiovascular;;  . CESAREAN SECTION    . CHOLECYSTECTOMY    . THYROIDECTOMY    . THYROIDECTOMY     Patient Active Problem List   Diagnosis Date Noted  . Pain in the chest   . Abnormal cardiac function test 02/26/2016  . Obesity (BMI 30-39.9)   . Angina pectoris (HCC) 02/25/2016  . Dyspnea 02/25/2016  . Hypothyroidism 02/25/2016  . Chest pain 09/07/2015      Prior to Admission medications   Medication Sig Start Date End Date Taking? Authorizing Provider  amoxicillin (AMOXIL) 500 MG capsule Take 1 capsule (500 mg total) by mouth 3 (three) times daily. Patient not taking: Reported on 02/23/2017 12/26/16   Joni Reining, PA-C  cyclobenzaprine (FLEXERIL) 5 MG tablet  Take 1 tablet (5 mg total) by mouth 3 (three) times daily as needed for muscle spasms. 05/18/17   Menshew, Charlesetta Ivory, PA-C  gabapentin (NEURONTIN) 300 MG capsule Take 1 capsule (300 mg total) by mouth 3 (three) times daily. 03/16/17   Loleta Rose, MD  HYDROcodone-acetaminophen (NORCO) 5-325 MG tablet Take 1 tablet by mouth every 4 (four) hours as needed for moderate pain. 05/05/18   Willy Eddy, MD  ibuprofen (ADVIL,MOTRIN) 600 MG tablet Take 1 tablet (600 mg total) by mouth every 8 (eight) hours as needed. Patient not taking: Reported on 02/23/2017 12/26/16   Joni Reining, PA-C  levothyroxine (SYNTHROID, LEVOTHROID) 137 MCG tablet Take 137 mcg by mouth daily before breakfast.    [provider]  lidocaine (XYLOCAINE) 2 % solution Use as directed 5 mLs in the mouth or throat every 6 (six) hours as needed for mouth pain. Patient not taking: Reported on 02/23/2017 12/26/16   Joni Reining, PA-C  meclizine (ANTIVERT) 25 MG tablet Take 1 tablet (25 mg total) by mouth 3 (three) times daily as needed for dizziness. 06/16/17   Dionne Bucy, MD  nabumetone (RELAFEN) 750 MG tablet Take 1 tablet (750 mg total) by mouth 2 (two) times daily. 05/18/17   Menshew, Charlesetta Ivory, PA-C  naproxen (NAPROSYN) 375 MG tablet Take 1  tablet (375 mg total) by mouth 2 (two) times daily with a meal. 08/06/17 08/06/18  Jeanmarie Plant, MD  omeprazole (PRILOSEC) 20 MG capsule Take 20 mg by mouth 2 (two) times daily before a meal.     [provider]  ranitidine (ZANTAC) 150 MG tablet Take 1 tablet (150 mg total) by mouth 2 (two) times daily. 07/28/16 07/28/17  Willy Eddy, MD  traMADol (ULTRAM) 50 MG tablet Take 1 tablet (50 mg total) by mouth every 6 (six) hours as needed for moderate pain. Patient not taking: Reported on 03/11/2017 12/26/16   Joni Reining, PA-C    Allergies Patient has no known allergies.    Social History Social History   Tobacco Use  . Smoking status: Never  Smoker  . Smokeless tobacco: Never Used  Substance Use Topics  . Alcohol use: No    Alcohol/week: 0.0 standard drinks  . Drug use: No    Review of Systems Patient denies headaches, rhinorrhea, blurry vision, numbness, shortness of breath, chest pain, edema, cough, abdominal pain, nausea, vomiting, diarrhea, dysuria, fevers, rashes or hallucinations unless otherwise stated above in HPI. ____________________________________________   PHYSICAL EXAM:  VITAL SIGNS: Vitals:   05/05/18 1612  BP: 138/85  Pulse: 78  Resp: 18  Temp: 98.4 F (36.9 C)  SpO2: 100%    Constitutional: Alert and oriented. Uncomfortable appearing Eyes: Conjunctivae are normal.  Head: Atraumatic. Nose: No congestion/rhinnorhea. Mouth/Throat: Mucous membranes are moist.   Neck: No stridor. Painless ROM.  Cardiovascular: Normal rate, regular rhythm. Grossly normal heart sounds.  Good peripheral circulation. Respiratory: Normal respiratory effort.  No retractions. Lungs CTAB. Gastrointestinal: Soft and nontender. No distention. No abdominal bruits. No CVA tenderness. Genitourinary: deferred Musculoskeletal: ttp of left flank. No lower extremity tenderness nor edema.  No joint effusions. Neurologic:  Normal speech and language. No gross focal neurologic deficits are appreciated. No facial droop Skin:  Skin is warm, dry and intact. No rash noted. Psychiatric: Mood and affect are normal. Speech and behavior are normal.  ____________________________________________   LABS (all labs ordered are listed, but only abnormal results are displayed)  Results for orders placed or performed during the hospital encounter of 05/05/18 (from the past 24 hour(s))  Urinalysis, Complete w Microscopic     Status: Abnormal   Collection Time: 05/05/18  4:15 PM  Result Value Ref Range   Color, Urine STRAW (A) YELLOW   APPearance CLEAR (A) CLEAR   Specific Gravity, Urine 1.005 1.005 - 1.030   pH 8.0 5.0 - 8.0   Glucose, UA  NEGATIVE NEGATIVE mg/dL   Hgb urine dipstick NEGATIVE NEGATIVE   Bilirubin Urine NEGATIVE NEGATIVE   Ketones, ur NEGATIVE NEGATIVE mg/dL   Protein, ur NEGATIVE NEGATIVE mg/dL   Nitrite NEGATIVE NEGATIVE   Leukocytes, UA NEGATIVE NEGATIVE   RBC / HPF 0-5 0 - 5 RBC/hpf   WBC, UA NONE SEEN 0 - 5 WBC/hpf   Bacteria, UA NONE SEEN NONE SEEN   Squamous Epithelial / LPF 0-5 0 - 5   Mucus PRESENT   Basic metabolic panel     Status: None   Collection Time: 05/05/18  4:15 PM  Result Value Ref Range   Sodium 138 135 - 145 mmol/L   Potassium 3.7 3.5 - 5.1 mmol/L   Chloride 108 98 - 111 mmol/L   CO2 23 22 - 32 mmol/L   Glucose, Bld 93 70 - 99 mg/dL   BUN 6 6 - 20 mg/dL   Creatinine, Ser 1.61  0.44 - 1.00 mg/dL   Calcium 8.9 8.9 - 16.1 mg/dL   GFR calc non Af Amer >60 >60 mL/min   GFR calc Af Amer >60 >60 mL/min   Anion gap 7 5 - 15  CBC     Status: Abnormal   Collection Time: 05/05/18  4:15 PM  Result Value Ref Range   WBC 6.1 3.6 - 11.0 K/uL   RBC 4.02 3.80 - 5.20 MIL/uL   Hemoglobin 11.5 (L) 12.0 - 16.0 g/dL   HCT 09.6 (L) 04.5 - 40.9 %   MCV 81.8 80.0 - 100.0 fL   MCH 28.5 26.0 - 34.0 pg   MCHC 34.9 32.0 - 36.0 g/dL   RDW 81.1 (H) 91.4 - 78.2 %   Platelets 316 150 - 440 K/uL  Pregnancy, urine     Status: None   Collection Time: 05/05/18  4:15 PM  Result Value Ref Range   Preg Test, Ur NEGATIVE NEGATIVE  Fibrin derivatives D-Dimer ([facility] only)     Status: None   Collection Time: 05/05/18  5:59 PM  Result Value Ref Range   Fibrin derivatives D-dimer ([facility]) 280.03 0.00 - 499.00 ng/mL (FEU)   ____________________________________________  ____________________________________________  RADIOLOGY  I personally reviewed all radiographic images ordered to evaluate for the above acute complaints and reviewed radiology reports and findings.  These findings were personally discussed with the patient.  Please see medical record for radiology  report.  ____________________________________________   PROCEDURES  Procedure(s) performed:  Procedures    Critical Care performed: no ____________________________________________   INITIAL IMPRESSION / ASSESSMENT AND PLAN / ED COURSE  Pertinent labs & imaging results that were available during my care of the patient were reviewed by me and considered in my medical decision making (see chart for details).   DDX: stone, msk strain, shingles, pe, rib fracture  Morgan Stevens is a 52 y.o. who presents to the ED with symptoms as described above.  Patient obviously uncomfortable.  Possible stone but given location of pain will also evaluate for evidence of left lower lobe PE pulmonary infarct.  She is low risk by [place] therefore will send d-dimer to further risk stratify.  Will provide IV pain medication IV antiemetic.  The patient will be placed on continuous pulse oximetry and telemetry for monitoring.  Laboratory evaluation will be sent to evaluate for the above complaints.     Clinical Course as of May 05 2113  Mon May 05, 2018  9562 D-dimer is negative therefore will order CT renal study evaluate for stone.   [PR]  1935 Patient reassessed.  Pain improved.  No evidence of obstructive stone.  Repeat abdominal exam soft and benign.  At this point I do believe patient stable and appropriate for outpatient follow-up.  Have discussed with the patient and available family all diagnostics and treatments performed thus far and all questions were answered to the best of my ability. The patient demonstrates understanding and agreement with plan.    [PR]    Clinical Course User Index [PR] Willy Eddy, MD     As part of my medical decision making, I reviewed the following data within the electronic MEDICAL RECORD NUMBER Nursing notes reviewed and incorporated, Labs reviewed, notes from prior ED visits.   ____________________________________________   FINAL CLINICAL IMPRESSION(S) / ED  DIAGNOSES  Final diagnoses:  Left flank pain      NEW MEDICATIONS STARTED DURING THIS VISIT:  Discharge Medication List as of 05/05/2018  7:56 PM  START taking these medications   Details  HYDROcodone-acetaminophen (NORCO) 5-325 MG tablet Take 1 tablet by mouth every 4 (four) hours as needed for moderate pain., Starting Mon 05/05/2018, Print         Note:  This document was prepared using Dragon voice recognition software and may include unintentional dictation errors.    Willy Eddy, MD 05/05/18 2114

## 2018-06-06 DIAGNOSIS — Z23 Encounter for immunization: Secondary | ICD-10-CM | POA: Diagnosis not present

## 2018-06-06 DIAGNOSIS — M5442 Lumbago with sciatica, left side: Secondary | ICD-10-CM | POA: Diagnosis not present

## 2018-06-06 DIAGNOSIS — M5134 Other intervertebral disc degeneration, thoracic region: Secondary | ICD-10-CM | POA: Diagnosis not present

## 2018-06-06 DIAGNOSIS — M5136 Other intervertebral disc degeneration, lumbar region: Secondary | ICD-10-CM | POA: Diagnosis not present

## 2018-06-09 ENCOUNTER — Other Ambulatory Visit: Payer: Self-pay | Admitting: Family Medicine

## 2018-06-09 DIAGNOSIS — Z1231 Encounter for screening mammogram for malignant neoplasm of breast: Secondary | ICD-10-CM

## 2018-06-11 DIAGNOSIS — F449 Dissociative and conversion disorder, unspecified: Secondary | ICD-10-CM | POA: Insufficient documentation

## 2018-06-27 DIAGNOSIS — Z01419 Encounter for gynecological examination (general) (routine) without abnormal findings: Secondary | ICD-10-CM | POA: Diagnosis not present

## 2018-06-27 DIAGNOSIS — E890 Postprocedural hypothyroidism: Secondary | ICD-10-CM | POA: Diagnosis not present

## 2018-06-27 DIAGNOSIS — Z0000 Encounter for general adult medical examination without abnormal findings: Secondary | ICD-10-CM | POA: Diagnosis not present

## 2018-06-30 ENCOUNTER — Ambulatory Visit
Admission: RE | Admit: 2018-06-30 | Discharge: 2018-06-30 | Disposition: A | Discharge status: Home / Self Care | Payer: Medicaid Other | Source: Ambulatory Visit | Attending: Family Medicine | Admitting: Family Medicine

## 2018-06-30 DIAGNOSIS — Z1231 Encounter for screening mammogram for malignant neoplasm of breast: Secondary | ICD-10-CM | POA: Insufficient documentation

## 2018-06-30 IMAGING — MG MM DIGITAL SCREENING BILAT W/ TOMO W/ CAD
8 series · 8 of 24 positions shown · non-contrast
Comparison: Previous exam(s).

ACR Breast Density Category a: The breast tissue is almost entirely
fatty.

CLINICAL DATA: Screening.

EXAM:
DIGITAL SCREENING BILATERAL MAMMOGRAM WITH TOMO AND CAD

[L MLO synth-2D]
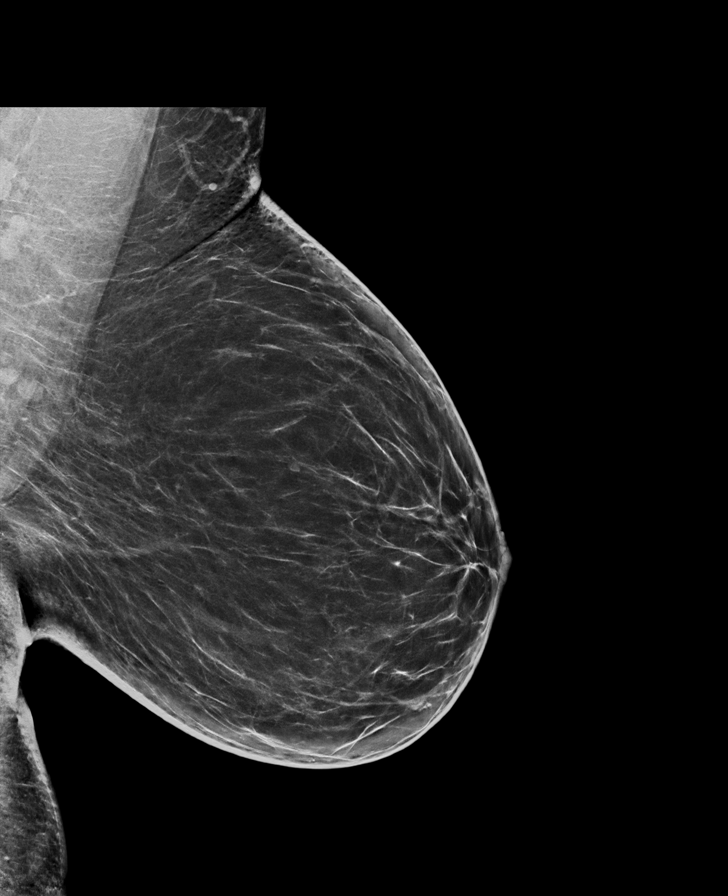

[R CC synth-2D]
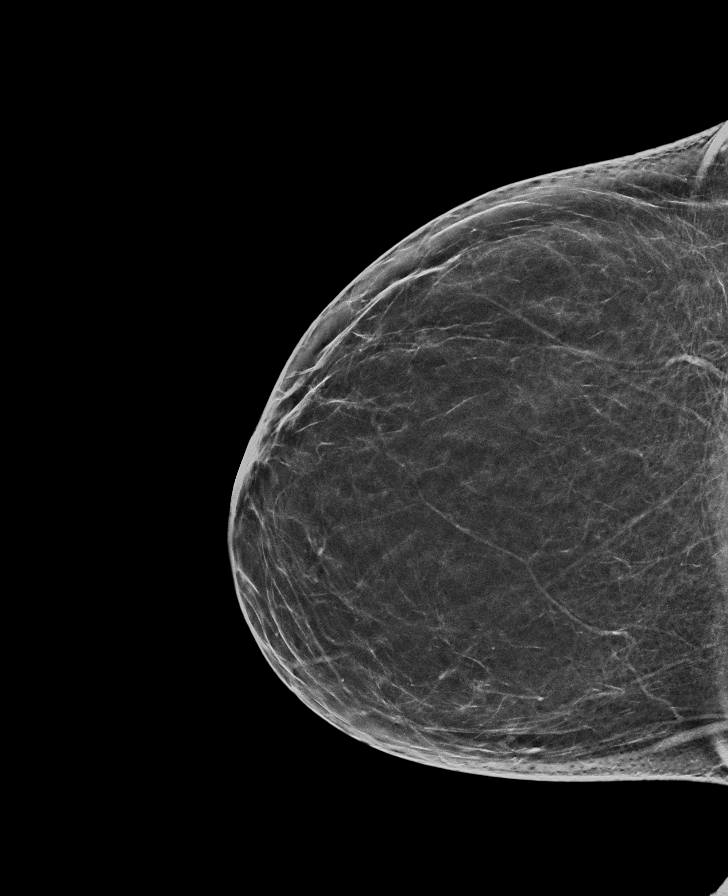

[L CC synth-2D]
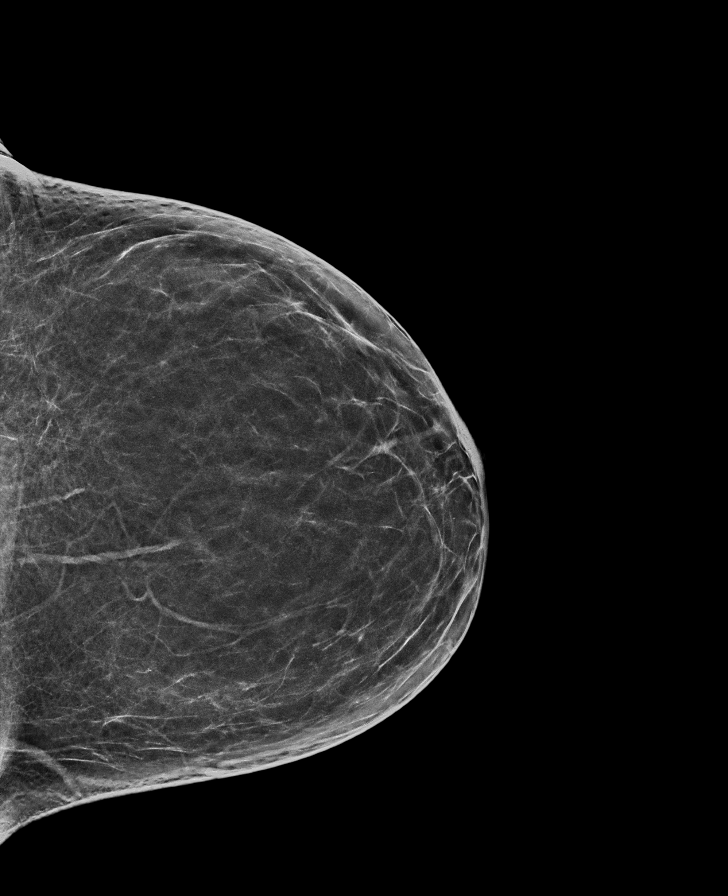

[R MLO synth-2D]
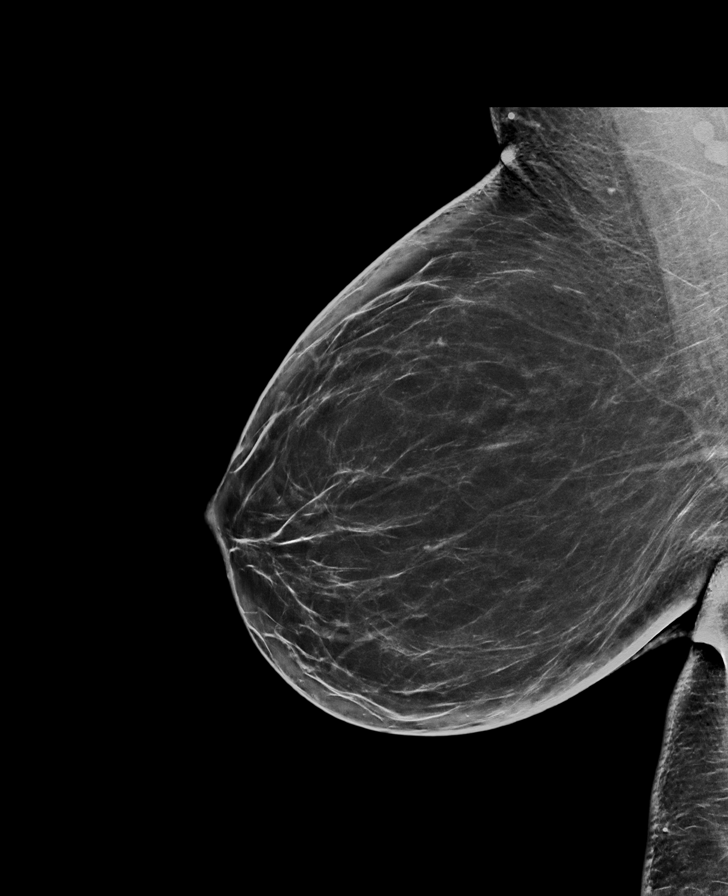

[L MLO tomo · tomo slice 41/82.0]
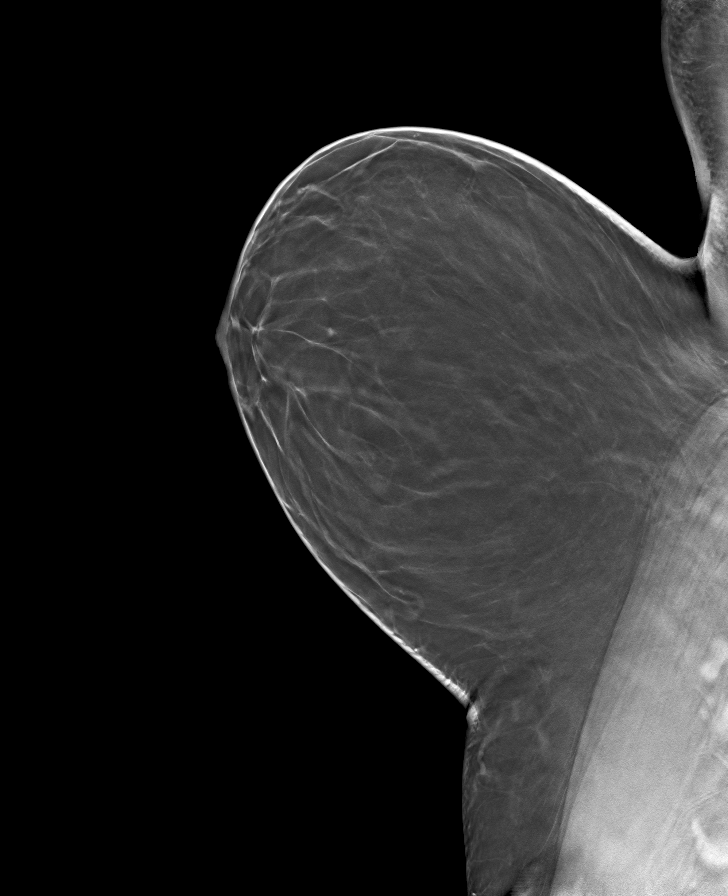

[L CC tomo · tomo slice 33/66.0]
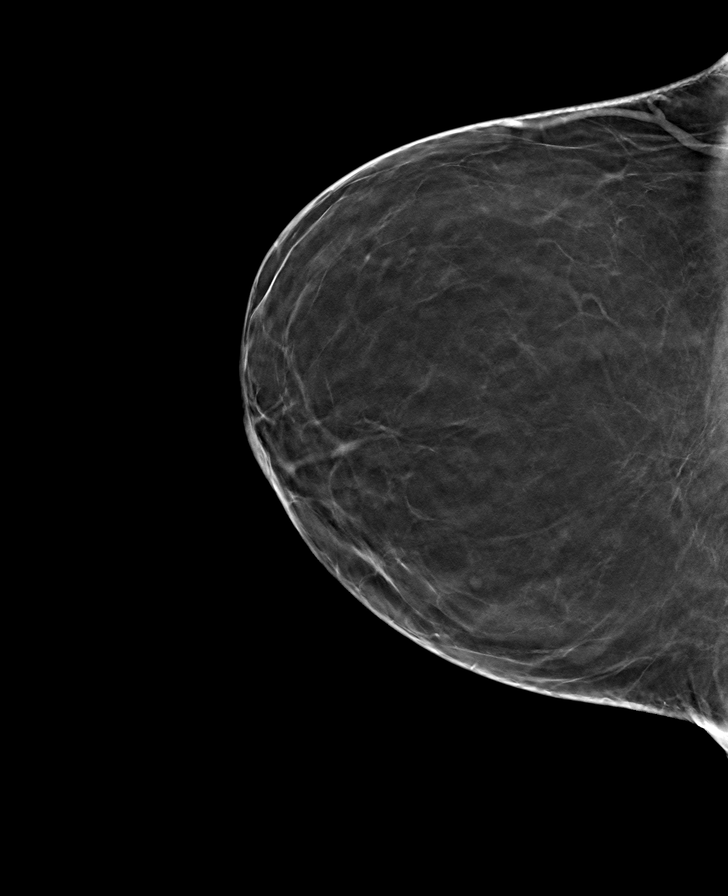

[R CC tomo · tomo slice 33/64.0]
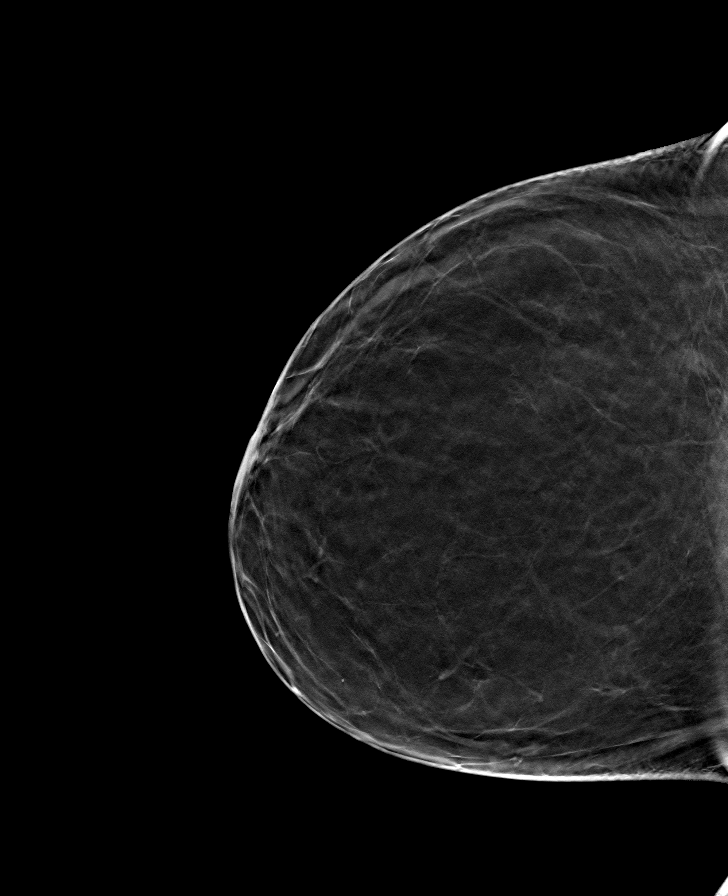

[R MLO tomo · tomo slice 41/82.0]
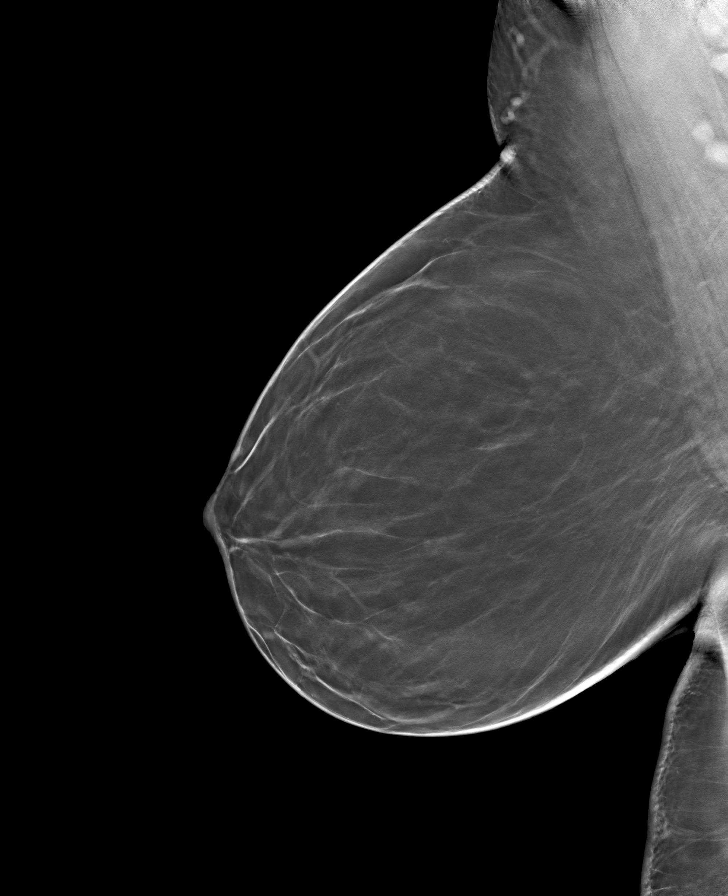

[8 of 24 positions shown; findings below may reference images not displayed]

FINDINGS: There are no findings suspicious for malignancy. Images were
processed with CAD.
IMPRESSION: No mammographic evidence of malignancy. A result letter of this
screening mammogram will be mailed directly to the patient.

RECOMMENDATION:
Screening mammogram in one year. (Code:[TA])

BI-RADS CATEGORY  1: Negative.

## 2018-07-10 DIAGNOSIS — Z0000 Encounter for general adult medical examination without abnormal findings: Secondary | ICD-10-CM | POA: Diagnosis not present

## 2018-07-10 DIAGNOSIS — E890 Postprocedural hypothyroidism: Secondary | ICD-10-CM | POA: Diagnosis not present

## 2018-07-11 ENCOUNTER — Emergency Department
Admission: EM | Admit: 2018-07-11 | Discharge: 2018-07-11 | Disposition: A | Discharge status: Home / Self Care | Payer: Medicaid Other | Attending: Emergency Medicine | Admitting: Emergency Medicine

## 2018-07-11 ENCOUNTER — Encounter: Payer: Self-pay | Admitting: Emergency Medicine

## 2018-07-11 ENCOUNTER — Other Ambulatory Visit: Payer: Self-pay

## 2018-07-11 DIAGNOSIS — M26623 Arthralgia of bilateral temporomandibular joint: Secondary | ICD-10-CM | POA: Diagnosis not present

## 2018-07-11 DIAGNOSIS — Z79899 Other long term (current) drug therapy: Secondary | ICD-10-CM | POA: Diagnosis not present

## 2018-07-11 DIAGNOSIS — R6884 Jaw pain: Secondary | ICD-10-CM | POA: Insufficient documentation

## 2018-07-11 MED ORDER — MELOXICAM 15 MG PO TABS: 15.0000 mg | ORAL_TABLET | Freq: Every day | ORAL | 0 refills | Status: DC

## 2018-07-11 MED ORDER — TRAMADOL HCL 50 MG PO TABS: 50.0000 mg | ORAL_TABLET | Freq: Four times a day (QID) | ORAL | 0 refills | Status: DC | PRN

## 2018-07-11 NOTE — ED Notes (Signed)
See triage note states she woke up with pain to bilateral jaw area  States pain is moving into her right ear  And on the left side it is moving into neck   Denies any dental pain SOB n/v or chest pain pain increased when she tries to talk

## 2018-07-11 NOTE — Discharge Instructions (Signed)
Purchase and wear a night guard. Follow up with primary care or dentist. Return to the ER for symptoms that change or worsen if unable to schedule an appointment.

## 2018-07-11 NOTE — ED Triage Notes (Signed)
Pt to ED c/o jaw pain that started this morning when waking up, denies eating food, states pain is bilateral but worse on the right.  Pain worse with palpation and movement.

## 2018-07-11 NOTE — ED Provider Notes (Signed)
[facility] Emergency Department Provider Note ____________________________________________  Time seen: Approximately 2:32 PM  I have reviewed the triage vital signs and the nursing notes.   HISTORY  Chief Complaint Jaw Pain    HPI Morgan Stevens is a 52 y.o. female who presents to the emergency department for evaluation and treatment of bilateral jaw pain that started upon awakening this morning.  Patient states she has never experienced this in the past.  Pain increases with opening and closing her mouth.  It that she is unaware whether she grinds her teeth at night.  She denies dental pain.  She denies recent sinus infection or sinus congestion.  She has had no fever or headache.  Pain in the jaw does sometimes radiate into the ears.  No alleviating measures attempted prior to arrival.  Past Medical History:  Diagnosis Date  . Gastritis   . Hiatal hernia   . Obesity (BMI 30-39.9)   . Reflux   . Thyroid disease     Patient Active Problem List   Diagnosis Date Noted  . Pain in the chest   . Abnormal cardiac function test 02/26/2016  . Obesity (BMI 30-39.9)   . Angina pectoris (HCC) 02/25/2016  . Dyspnea 02/25/2016  . Hypothyroidism 02/25/2016  . Chest pain 09/07/2015    Past Surgical History:  Procedure Laterality Date  . CARDIAC CATHETERIZATION  02/27/2016   Procedure: Left Heart Cath and Coronary Angiography;  Surgeon: Corky Crafts, MD;  Location: [facility] INVASIVE CV LAB;  Service: Cardiovascular;;  . CESAREAN SECTION    . CHOLECYSTECTOMY    . THYROIDECTOMY    . THYROIDECTOMY      Prior to Admission medications   Medication Sig Start Date End Date Taking? Authorizing Provider  amoxicillin (AMOXIL) 500 MG capsule Take 1 capsule (500 mg total) by mouth 3 (three) times daily. Patient not taking: Reported on 02/23/2017 12/26/16   Joni Reining, PA-C  cyclobenzaprine (FLEXERIL) 5 MG tablet Take 1 tablet (5 mg total) by mouth 3 (three) times daily  as needed for muscle spasms. 05/18/17   Menshew, Charlesetta Ivory, PA-C  gabapentin (NEURONTIN) 300 MG capsule Take 1 capsule (300 mg total) by mouth 3 (three) times daily. 03/16/17   Loleta Rose, MD  HYDROcodone-acetaminophen (NORCO) 5-325 MG tablet Take 1 tablet by mouth every 4 (four) hours as needed for moderate pain. 05/05/18   Willy Eddy, MD  ibuprofen (ADVIL,MOTRIN) 600 MG tablet Take 1 tablet (600 mg total) by mouth every 8 (eight) hours as needed. Patient not taking: Reported on 02/23/2017 12/26/16   Joni Reining, PA-C  levothyroxine (SYNTHROID, LEVOTHROID) 137 MCG tablet Take 137 mcg by mouth daily before breakfast.    [provider]  lidocaine (XYLOCAINE) 2 % solution Use as directed 5 mLs in the mouth or throat every 6 (six) hours as needed for mouth pain. Patient not taking: Reported on 02/23/2017 12/26/16   Joni Reining, PA-C  meclizine (ANTIVERT) 25 MG tablet Take 1 tablet (25 mg total) by mouth 3 (three) times daily as needed for dizziness. 06/16/17   Dionne Bucy, MD  meloxicam (MOBIC) 15 MG tablet Take 1 tablet (15 mg total) by mouth daily. 07/11/18   Nyheim Seufert, Rulon Eisenmenger B, FNP  nabumetone (RELAFEN) 750 MG tablet Take 1 tablet (750 mg total) by mouth 2 (two) times daily. 05/18/17   Menshew, Charlesetta Ivory, PA-C  naproxen (NAPROSYN) 375 MG tablet Take 1 tablet (375 mg total) by mouth 2 (two) times daily  with a meal. 08/06/17 08/06/18  Jeanmarie PlantMcShane, James A, MD  omeprazole (PRILOSEC) 20 MG capsule Take 20 mg by mouth 2 (two) times daily before a meal.     [provider]  ranitidine (ZANTAC) 150 MG tablet Take 1 tablet (150 mg total) by mouth 2 (two) times daily. 07/28/16 07/28/17  Willy Eddyobinson, Patrick, MD  traMADol (ULTRAM) 50 MG tablet Take 1 tablet (50 mg total) by mouth every 6 (six) hours as needed. 07/11/18   Chinita Pesterriplett, Netta Fodge B, FNP    Allergies Patient has no known allergies.  Family History  Problem Relation Age of Onset  . Breast cancer Neg Hx     Social  History Social History   Tobacco Use  . Smoking status: Never Smoker  . Smokeless tobacco: Never Used  Substance Use Topics  . Alcohol use: No    Alcohol/week: 0.0 standard drinks  . Drug use: No    Review of Systems Constitutional: Negative for fever. Cardiovascular: Negative for chest pain. Respiratory: Negative for shortness of breath. Musculoskeletal: Positive for bilateral jaw pain. Skin: Negative for facial swelling or erythema. Neurological: Negative for decrease in sensation  ____________________________________________   PHYSICAL EXAM:  VITAL SIGNS: ED Triage Vitals  Enc Vitals Group     BP 07/11/18 1318 (!) 154/80     Pulse Rate 07/11/18 1318 78     Resp --      Temp 07/11/18 1318 98.4 F (36.9 C)     Temp Source 07/11/18 1318 Oral     SpO2 07/11/18 1318 99 %     Weight 07/11/18 1319 218 lb (98.9 kg)     Height 07/11/18 1319 5\' 4"  (1.626 m)     Head Circumference --      Peak Flow --      Pain Score 07/11/18 1323 7     Pain Loc --      Pain Edu? --      Excl. in GC? --     Constitutional: Alert and oriented. Well appearing and in no acute distress. Eyes: Conjunctivae are clear without discharge or drainage Head: Atraumatic Neck: Supple.  No midline tenderness. Respiratory: No cough. Respirations are even and unlabored. Musculoskeletal: Guarded, slow range of motion of the jaw Neurologic: Awake, alert, oriented x4.  Motor and sensory function is intact. Skin: No facial edema or erythema. Psychiatric: Affect and behavior are appropriate.  ____________________________________________   LABS (all labs ordered are listed, but only abnormal results are displayed)  Labs Reviewed - No data to display ____________________________________________  RADIOLOGY  Not indicated ____________________________________________   PROCEDURES  Procedures  ____________________________________________   INITIAL IMPRESSION / ASSESSMENT AND PLAN / ED  COURSE  Morgan Stevens is a 52 y.o. who presents to the emergency department for treatment and evaluation of bilateral jaw pain that she noticed upon awakening this morning.  Patient will be given tramadol and Meloxicam.  She was advised that she should also purchase a bite guard and wear it at night when she sleeps.  She was also advised to eat a soft diet until pain improves.  She was encouraged to follow-up with the dentist.  She was advised to return to the emergency department for symptoms of change or worsen.  Medications - No data to display  Pertinent labs & imaging results that were available during my care of the patient were reviewed by me and considered in my medical decision making (see chart for details).  _________________________________________   FINAL CLINICAL IMPRESSION(S) / ED DIAGNOSES  Final diagnoses:  Bilateral temporomandibular joint pain    ED Discharge Orders         Ordered    meloxicam (MOBIC) 15 MG tablet  Daily     07/11/18 1434    traMADol (ULTRAM) 50 MG tablet  Every 6 hours PRN     07/11/18 1434           If controlled substance prescribed during this visit, 12 month history viewed on the [facility] prior to issuing an initial prescription for Schedule II or III opiod.    Chinita Pester, FNP 07/11/18 1445    Governor Rooks, MD 07/11/18 [phone]

## 2018-08-16 ENCOUNTER — Emergency Department
Admission: EM | Admit: 2018-08-16 | Discharge: 2018-08-16 | Disposition: A | Discharge status: Home / Self Care | Payer: Medicaid Other | Attending: Emergency Medicine | Admitting: Emergency Medicine

## 2018-08-16 ENCOUNTER — Emergency Department: Payer: Medicaid Other

## 2018-08-16 ENCOUNTER — Other Ambulatory Visit: Payer: Self-pay

## 2018-08-16 DIAGNOSIS — R0789 Other chest pain: Secondary | ICD-10-CM | POA: Diagnosis not present

## 2018-08-16 DIAGNOSIS — E039 Hypothyroidism, unspecified: Secondary | ICD-10-CM | POA: Diagnosis not present

## 2018-08-16 DIAGNOSIS — Z79899 Other long term (current) drug therapy: Secondary | ICD-10-CM | POA: Insufficient documentation

## 2018-08-16 DIAGNOSIS — R079 Chest pain, unspecified: Secondary | ICD-10-CM | POA: Diagnosis not present

## 2018-08-16 DIAGNOSIS — R0602 Shortness of breath: Secondary | ICD-10-CM | POA: Diagnosis not present

## 2018-08-16 LAB — BASIC METABOLIC PANEL
Anion gap: 8 (ref 5–15)
BUN: 10 mg/dL (ref 6–20)
CO2: 21 mmol/L — ABNORMAL LOW (ref 22–32)
Calcium: 8.6 mg/dL — ABNORMAL LOW (ref 8.9–10.3)
Chloride: 106 mmol/L (ref 98–111)
Creatinine, Ser: 0.68 mg/dL (ref 0.44–1.00)
GFR calc Af Amer: >60 mL/min (ref >60)
GFR calc non Af Amer: >60 mL/min (ref >60)
Glucose, Bld: 111 mg/dL — ABNORMAL HIGH (ref 70–99)
Potassium: 3.5 mmol/L (ref 3.5–5.1)
Sodium: 135 mmol/L (ref 135–145)

## 2018-08-16 LAB — CBC
HCT: 34.5 % — ABNORMAL LOW (ref 36.0–46.0)
Hemoglobin: 11.4 g/dL — ABNORMAL LOW (ref 12.0–15.0)
MCH: 27.1 pg (ref 26.0–34.0)
MCHC: 33 g/dL (ref 30.0–36.0)
MCV: 81.9 fL (ref 80.0–100.0)
Platelets: 302 10*3/uL (ref 150–400)
RBC: 4.21 MIL/uL (ref 3.87–5.11)
RDW: 14.2 % (ref 11.5–15.5)
WBC: 5.5 10*3/uL (ref 4.0–10.5)
nRBC: 0 % (ref 0.0–0.2)

## 2018-08-16 LAB — TROPONIN I: Troponin I: <0.03 ng/mL (ref <0.03)

## 2018-08-16 IMAGING — CR DG CHEST 2V
1 series · 2 of 2 positions shown · non-contrast
Comparison: [DATE]

CLINICAL DATA: Left-sided chest pain and shortness of breath for 2
days.

EXAM:
CHEST - 2 VIEW

[Series 1: dg chest 2 view · 0.14mm/px · 2 of 2 slices shown]
[im 1/2]
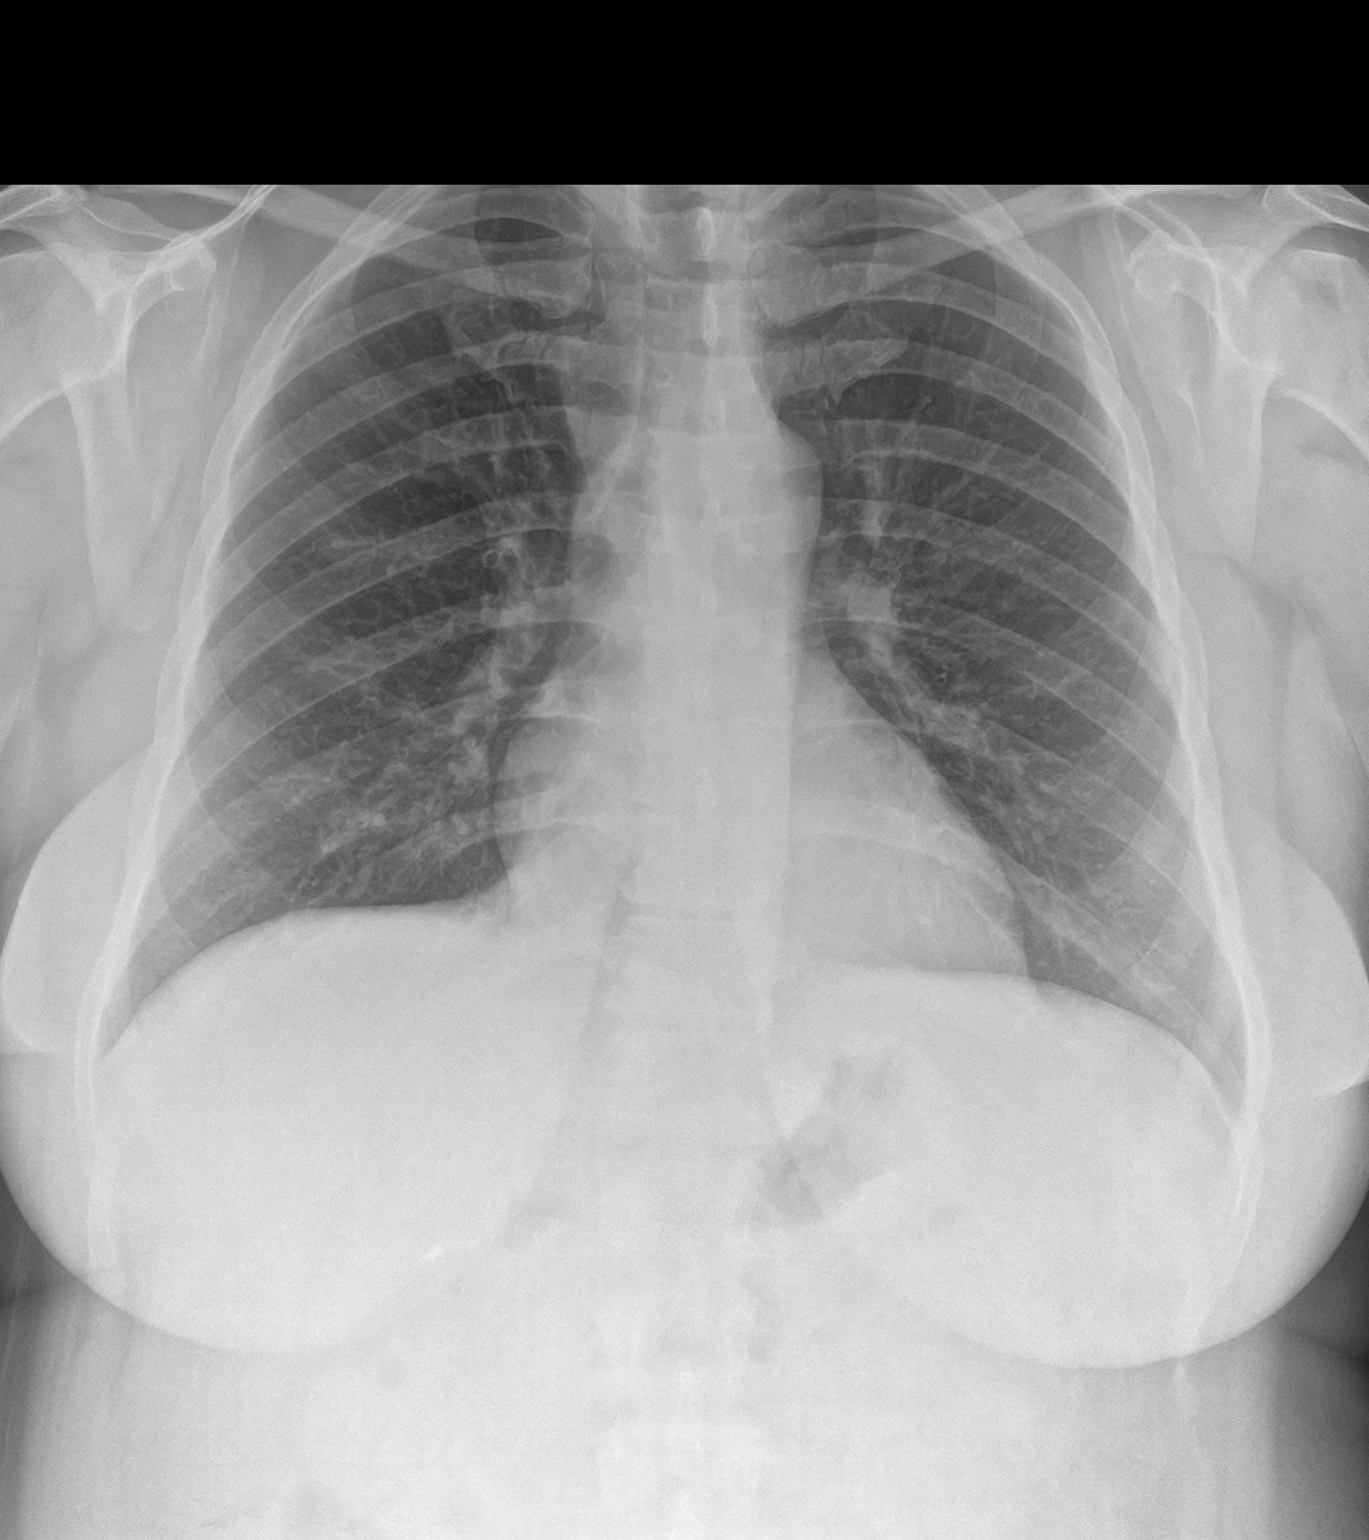
[im 2/2]
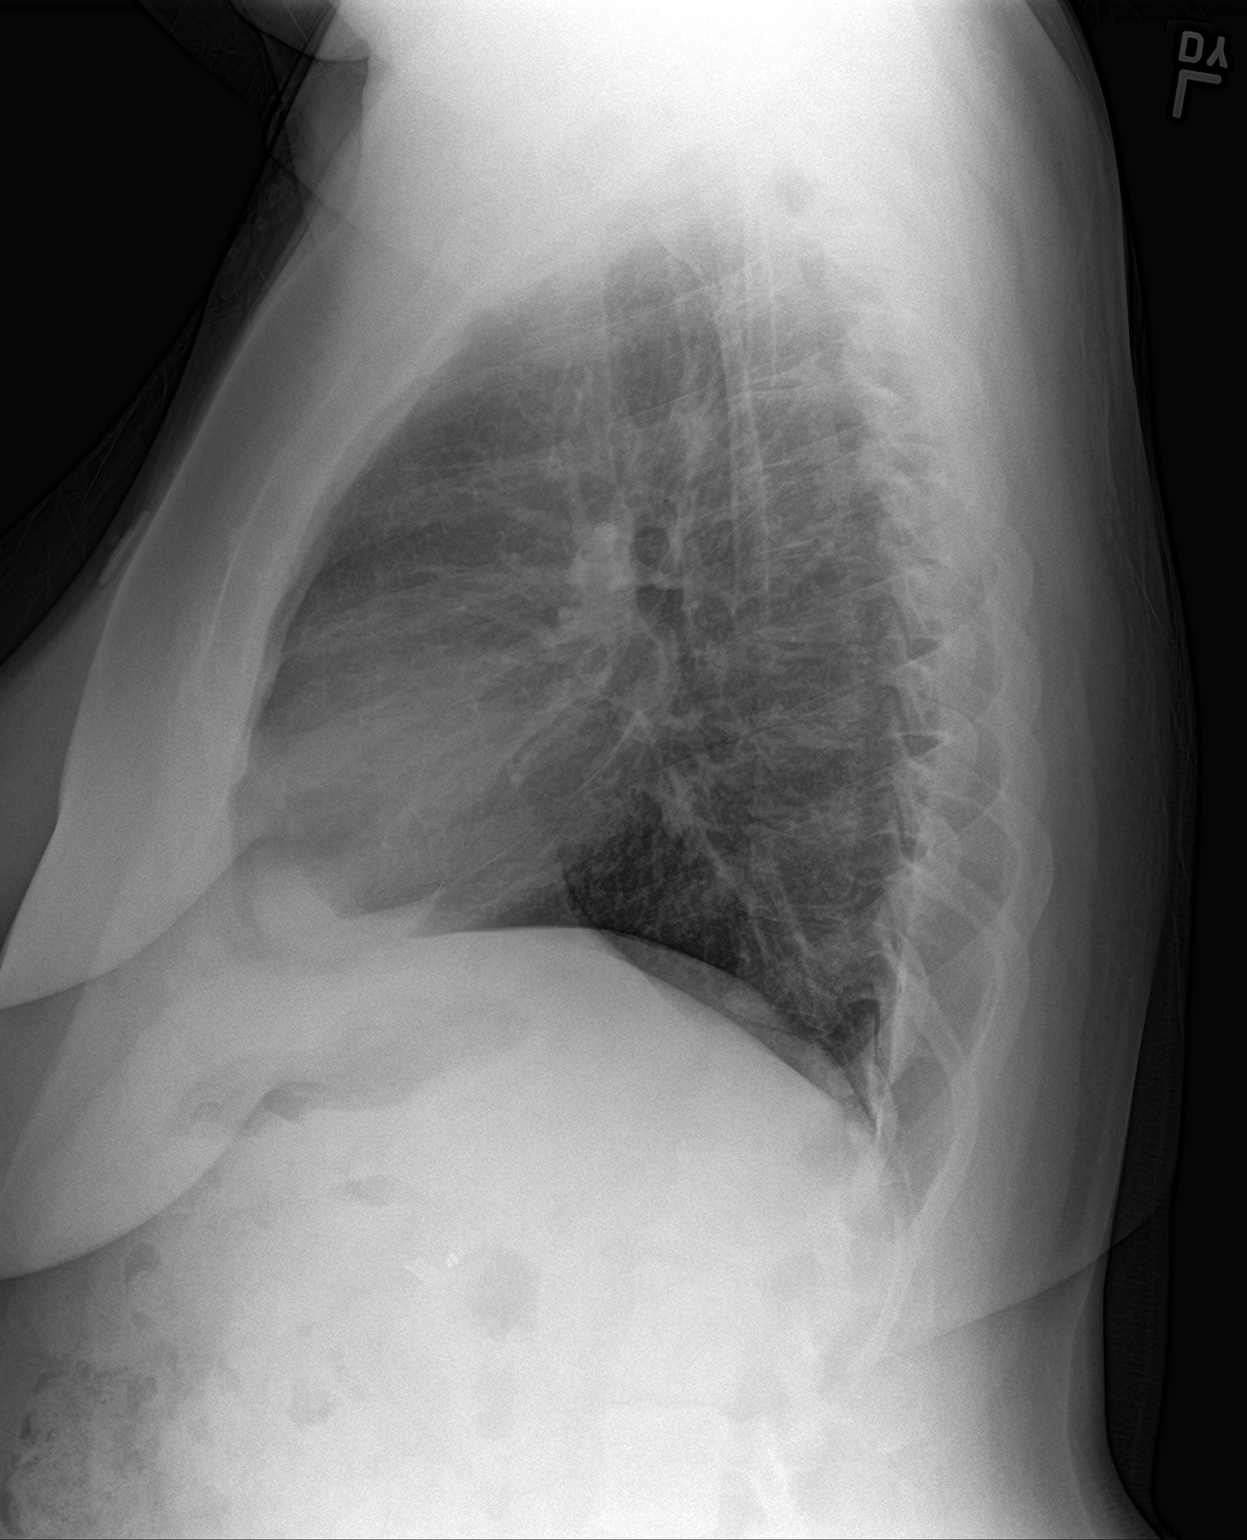

[2 of 2 positions shown; findings below may reference images not displayed]

FINDINGS: The heart size and mediastinal contours are within normal limits.
Both lungs are clear. The visualized skeletal structures are
unremarkable.
IMPRESSION: Negative.  No active cardiopulmonary disease.

## 2018-08-16 MED ORDER — KETOROLAC TROMETHAMINE 30 MG/ML IJ SOLN
15.0000 mg | Freq: Once | INTRAMUSCULAR | Status: AC
  Administered 2018-08-16: 15 mg via INTRAVENOUS
  Filled 2018-08-16: qty 1

## 2018-08-16 NOTE — ED Triage Notes (Signed)
Pt states she woke up not feeling well. C/o difficulty breathing, upper CP, and upper back pain. Crying during triage. No asthma of COPD hx. Speaking in complete sentences.

## 2018-08-16 NOTE — ED Provider Notes (Addendum)
[facility] Emergency Department Provider Note  ____________________________________________   I have reviewed the triage vital signs and the nursing notes. Where available I have reviewed prior notes and, if possible and indicated, outside hospital notes.    HISTORY  Chief Complaint Shortness of Breath and Chest Pain    HPI Morgan Stevens is a 52 y.o. female suffers from obesity, has had multiple visits to the emergency department for different symptoms which fortunately have not revealed much in our testing.  Specifically I do note that the patient has had, over the last few years, in terms of acute actionable pathology, 2 negative CT scans of her abdomen, 5 negative CAT scans of her head 2 differen unremarkable CT scan of her chest with contrast, 2 unremarkable CT scans with contrast of the head and neck, four different negative ultrasounds of her lower extremity for DVT, and by my count a total of 29 different negative chest x-rays, presents today complaining of muscle spasm in the left side of her chest trapezius muscle and shoulder.  This started while she was sitting awkwardly in a movie theater.  There is been no pleuritic pain she is actually not short of breath just has shoulder pain.  She states that she has had no fever no chills no nausea no vomiting no abdominal pain no diarrhea.  Happened while she was hunched over in a movie theater watching a movie and she feels that she was sitting awkwardly, no fall, no headache no numbness no weakness no radiation of pain nothing makes it better she has not tried to take any medications for it nothing makes it worse except for moving around   Past Medical History:  Diagnosis Date  . Gastritis   . Hiatal hernia   . Obesity (BMI 30-39.9)   . Reflux   . Thyroid disease     Patient Active Problem List   Diagnosis Date Noted  . Pain in the chest   . Abnormal cardiac function test 02/26/2016  . Obesity (BMI 30-39.9)    . Angina pectoris (HCC) 02/25/2016  . Dyspnea 02/25/2016  . Hypothyroidism 02/25/2016  . Chest pain 09/07/2015    Past Surgical History:  Procedure Laterality Date  . CARDIAC CATHETERIZATION  02/27/2016   Procedure: Left Heart Cath and Coronary Angiography;  Surgeon: Corky Crafts, MD;  Location: [facility] INVASIVE CV LAB;  Service: Cardiovascular;;  . CESAREAN SECTION    . CHOLECYSTECTOMY    . THYROIDECTOMY    . THYROIDECTOMY      Prior to Admission medications   Medication Sig Start Date End Date Taking? Authorizing Provider  amoxicillin (AMOXIL) 500 MG capsule Take 1 capsule (500 mg total) by mouth 3 (three) times daily. Patient not taking: Reported on 02/23/2017 12/26/16   Joni Reining, PA-C  cyclobenzaprine (FLEXERIL) 5 MG tablet Take 1 tablet (5 mg total) by mouth 3 (three) times daily as needed for muscle spasms. 05/18/17   Menshew, Charlesetta Ivory, PA-C  gabapentin (NEURONTIN) 300 MG capsule Take 1 capsule (300 mg total) by mouth 3 (three) times daily. 03/16/17   Loleta Rose, MD  HYDROcodone-acetaminophen (NORCO) 5-325 MG tablet Take 1 tablet by mouth every 4 (four) hours as needed for moderate pain. 05/05/18   Willy Eddy, MD  ibuprofen (ADVIL,MOTRIN) 600 MG tablet Take 1 tablet (600 mg total) by mouth every 8 (eight) hours as needed. Patient not taking: Reported on 02/23/2017 12/26/16   Joni Reining, PA-C  levothyroxine (SYNTHROID, LEVOTHROID) 137 MCG  tablet Take 137 mcg by mouth daily before breakfast.    [provider]  lidocaine (XYLOCAINE) 2 % solution Use as directed 5 mLs in the mouth or throat every 6 (six) hours as needed for mouth pain. Patient not taking: Reported on 02/23/2017 12/26/16   Joni ReiningSmith, Ronald K, PA-C  meclizine (ANTIVERT) 25 MG tablet Take 1 tablet (25 mg total) by mouth 3 (three) times daily as needed for dizziness. 06/16/17   Dionne BucySiadecki, Sebastian, MD  meloxicam (MOBIC) 15 MG tablet Take 1 tablet (15 mg total) by mouth daily. 07/11/18   Triplett,  Rulon Eisenmengerari B, FNP  nabumetone (RELAFEN) 750 MG tablet Take 1 tablet (750 mg total) by mouth 2 (two) times daily. 05/18/17   Menshew, Charlesetta IvoryJenise V Bacon, PA-C  omeprazole (PRILOSEC) 20 MG capsule Take 20 mg by mouth 2 (two) times daily before a meal.     [provider]  ranitidine (ZANTAC) 150 MG tablet Take 1 tablet (150 mg total) by mouth 2 (two) times daily. 07/28/16 07/28/17  Willy Eddyobinson, Patrick, MD  traMADol (ULTRAM) 50 MG tablet Take 1 tablet (50 mg total) by mouth every 6 (six) hours as needed. 07/11/18   Chinita Pesterriplett, Cari B, FNP    Allergies Patient has no known allergies.  Family History  Problem Relation Age of Onset  . Breast cancer Neg Hx     Social History Social History   Tobacco Use  . Smoking status: Never Smoker  . Smokeless tobacco: Never Used  Substance Use Topics  . Alcohol use: No    Alcohol/week: 0.0 standard drinks  . Drug use: No    Review of Systems Constitutional: No fever/chills Eyes: No visual changes. ENT: No sore throat. No stiff neck no neck pain Cardiovascular: no Cardiovascular complaints. Respiratory: Denies shortness of breath. Gastrointestinal:   no vomiting.  No diarrhea.  No constipation. Genitourinary: Negative for dysuria. Musculoskeletal: Negative lower extremity swelling Skin: Negative for rash. Neurological: Negative for severe headaches, focal weakness or numbness.   ____________________________________________   PHYSICAL EXAM:  VITAL SIGNS: ED Triage Vitals  Enc Vitals Group     BP 08/16/18 1409 (!) 151/84     Pulse Rate 08/16/18 1409 75     Resp 08/16/18 1409 20     Temp 08/16/18 1409 98.6 F (37 C)     Temp Source 08/16/18 1409 Oral     SpO2 08/16/18 1409 100 %     Weight 08/16/18 1410 218 lb (98.9 kg)     Height 08/16/18 1410 5\' 4"  (1.626 m)     Head Circumference --      Peak Flow --      Pain Score 08/16/18 1409 8     Pain Loc --      Pain Edu? --      Excl. in GC? --     Constitutional: Alert and oriented. Well  appearing and in no acute distress.  Anxious. Eyes: Conjunctivae are normal Head: Atraumatic HEENT: No congestion/rhinnorhea. Mucous membranes are moist.  Oropharynx non-erythematous Neck:   Nontender with no meningismus, no masses, no stridor Cardiovascular: Normal rate, regular rhythm. Grossly normal heart sounds.  Good peripheral circulation. Chest: There is tenderness palpation to the trapezius muscle the left chest, there is tenderness to palpation to the rhomboid muscle in the upper back there is tenderness to palpation in the pectoralis muscle and the supporting muscles of the shoulder.  She is able to lift her shoulder with no difficulty there is no evidence of injury, no  redness no shingles no erythema no crepitus no flail chest when I touch this area she states "ouch that is the pain right there" and pulls back Respiratory: Normal respiratory effort.  No retractions. Lungs CTAB. Abdominal: Soft and nontender. No distention. No guarding no rebound Back:  There is no other focal tenderness or step off, see above.  there is no midline tenderness there are no lesions noted. there is no CVA tenderness Musculoskeletal: No lower extremity tenderness, no upper extremity tenderness. No joint effusions, no DVT signs strong distal pulses no edema Neurologic:  Normal speech and language. No gross focal neurologic deficits are appreciated.  Skin:  Skin is warm, dry and intact. No rash noted. Psychiatric: Mood and affect are normal. Speech and behavior are anxious.  ____________________________________________   LABS (all labs ordered are listed, but only abnormal results are displayed)  Labs Reviewed  BASIC METABOLIC PANEL - Abnormal; Notable for the following components:      Result Value   CO2 21 (*)    Glucose, Bld 111 (*)    Calcium 8.6 (*)    All other components within normal limits  CBC - Abnormal; Notable for the following components:   Hemoglobin 11.4 (*)    HCT 34.5 (*)    All  other components within normal limits  TROPONIN I  POC URINE PREG, ED    Pertinent labs  results that were available during my care of the patient were reviewed by me and considered in my medical decision making (see chart for details). ____________________________________________  EKG  I personally interpreted any EKGs ordered by me or triage Sinus rhythm rate 83 bpm no acute ST elevation depression normal axis, no ischemic changes noted unremarkable ____________________________________________  RADIOLOGY  Pertinent labs & imaging results that were available during my care of the patient were reviewed by me and considered in my medical decision making (see chart for details). If possible, patient and/or family made aware of any abnormal findings.  Dg Chest 2 View  Result Date: 08/16/2018 CLINICAL DATA:  Left-sided chest pain and shortness of breath for 2 days. EXAM: CHEST - 2 VIEW COMPARISON:  10/11/2017 FINDINGS: The heart size and mediastinal contours are within normal limits. Both lungs are clear. The visualized skeletal structures are unremarkable. IMPRESSION: Negative.  No active cardiopulmonary disease. Electronically Signed   By: Myles RosenthalJohn  Stahl M.D.   On: 08/16/2018 14:59   ____________________________________________    PROCEDURES  Procedure(s) performed: None  Procedures  Critical Care performed: None  ____________________________________________   INITIAL IMPRESSION / ASSESSMENT AND PLAN / ED COURSE  Pertinent labs & imaging results that were available during my care of the patient were reviewed by me and considered in my medical decision making (see chart for details).  Here with muscle pain, I do not think she has PE ACS dissection myocarditis endocarditis pericarditis pneumonia pneumothorax referred abdominal pain pericardial effusion pericardial tamponade or any other acute life-threatening pathology today.  Patient has had extensive negative work-ups for  multiple different complaints some of very similar to this in the past.  Why this does not mean that there is nothing wrong with her today of any seriousness, it does mean that we need to be a little bit aware of her radiation burden lifetime because we have had innumerable studies which are not borne out by the results.  Nonetheless, patient was put in for chest x-ray and we will evaluate that I think that is again minimal exposure test, but I do not  think she merits a CT scan or other acute radiographic imaging or ultrasound.  Her belly is complete benign and I think this is referred with splenic pain from a spontaneous splenic lesion or rupture or other intra-abdominal pathology.  We will give her some Toradol for pain and she feels better we will try to get her home.  I do not think serial cardiac enzymes are indicated EKG is totally normal very reproducible pain.  ----------------------------------------- 4:48 PM on 08/16/2018 -----------------------------------------  Pain is greatly reduced. Wants to go home.  At this time, there does not appear to be clinical evidence to support the diagnosis of pulmonary embolus, dissection, myocarditis, endocarditis, pericarditis, pericardial tamponade, acute coronary syndrome, pneumothorax, pneumonia, or any other acute intrathoracic pathology that will require admission or acute intervention. Nor is there evidence of any significant intra-abdominal pathology causing this discomfort.  Precautions follow-up given understood    ____________________________________________   FINAL CLINICAL IMPRESSION(S) / ED DIAGNOSES  Final diagnoses:  None      This chart was dictated using voice recognition software.  Despite best efforts to proofread,  errors can occur which can change meaning.      Jeanmarie Plant, MD 08/16/18 1526    Jeanmarie Plant, MD 08/16/18 [phone]

## 2018-10-19 DIAGNOSIS — R1110 Vomiting, unspecified: Secondary | ICD-10-CM | POA: Diagnosis not present

## 2018-10-19 DIAGNOSIS — J020 Streptococcal pharyngitis: Secondary | ICD-10-CM | POA: Diagnosis not present

## 2018-10-19 DIAGNOSIS — R5381 Other malaise: Secondary | ICD-10-CM | POA: Diagnosis not present

## 2018-10-19 DIAGNOSIS — R509 Fever, unspecified: Secondary | ICD-10-CM | POA: Diagnosis not present

## 2018-10-31 ENCOUNTER — Emergency Department: Payer: Medicaid Other

## 2018-10-31 ENCOUNTER — Other Ambulatory Visit: Payer: Self-pay

## 2018-10-31 DIAGNOSIS — Z79899 Other long term (current) drug therapy: Secondary | ICD-10-CM | POA: Insufficient documentation

## 2018-10-31 DIAGNOSIS — R079 Chest pain, unspecified: Secondary | ICD-10-CM | POA: Diagnosis not present

## 2018-10-31 DIAGNOSIS — E039 Hypothyroidism, unspecified: Secondary | ICD-10-CM | POA: Diagnosis not present

## 2018-10-31 LAB — CBC
HCT: 35.1 % — ABNORMAL LOW (ref 36.0–46.0)
Hemoglobin: 11.7 g/dL — ABNORMAL LOW (ref 12.0–15.0)
MCH: 27.9 pg (ref 26.0–34.0)
MCHC: 33.3 g/dL (ref 30.0–36.0)
MCV: 83.8 fL (ref 80.0–100.0)
Platelets: 287 10*3/uL (ref 150–400)
RBC: 4.19 MIL/uL (ref 3.87–5.11)
RDW: 14.6 % (ref 11.5–15.5)
WBC: 7.3 10*3/uL (ref 4.0–10.5)
nRBC: 0 % (ref 0.0–0.2)

## 2018-10-31 IMAGING — CR DG CHEST 2V
1 series · 2 of 2 positions shown · non-contrast
Comparison: Radiographs [DATE], additional priors

CLINICAL DATA: Midsternal chest pain.

EXAM:
CHEST - 2 VIEW

[Series 1: dg chest 2 view · 0.14mm/px · 2 of 2 slices shown]
[im 1/2]
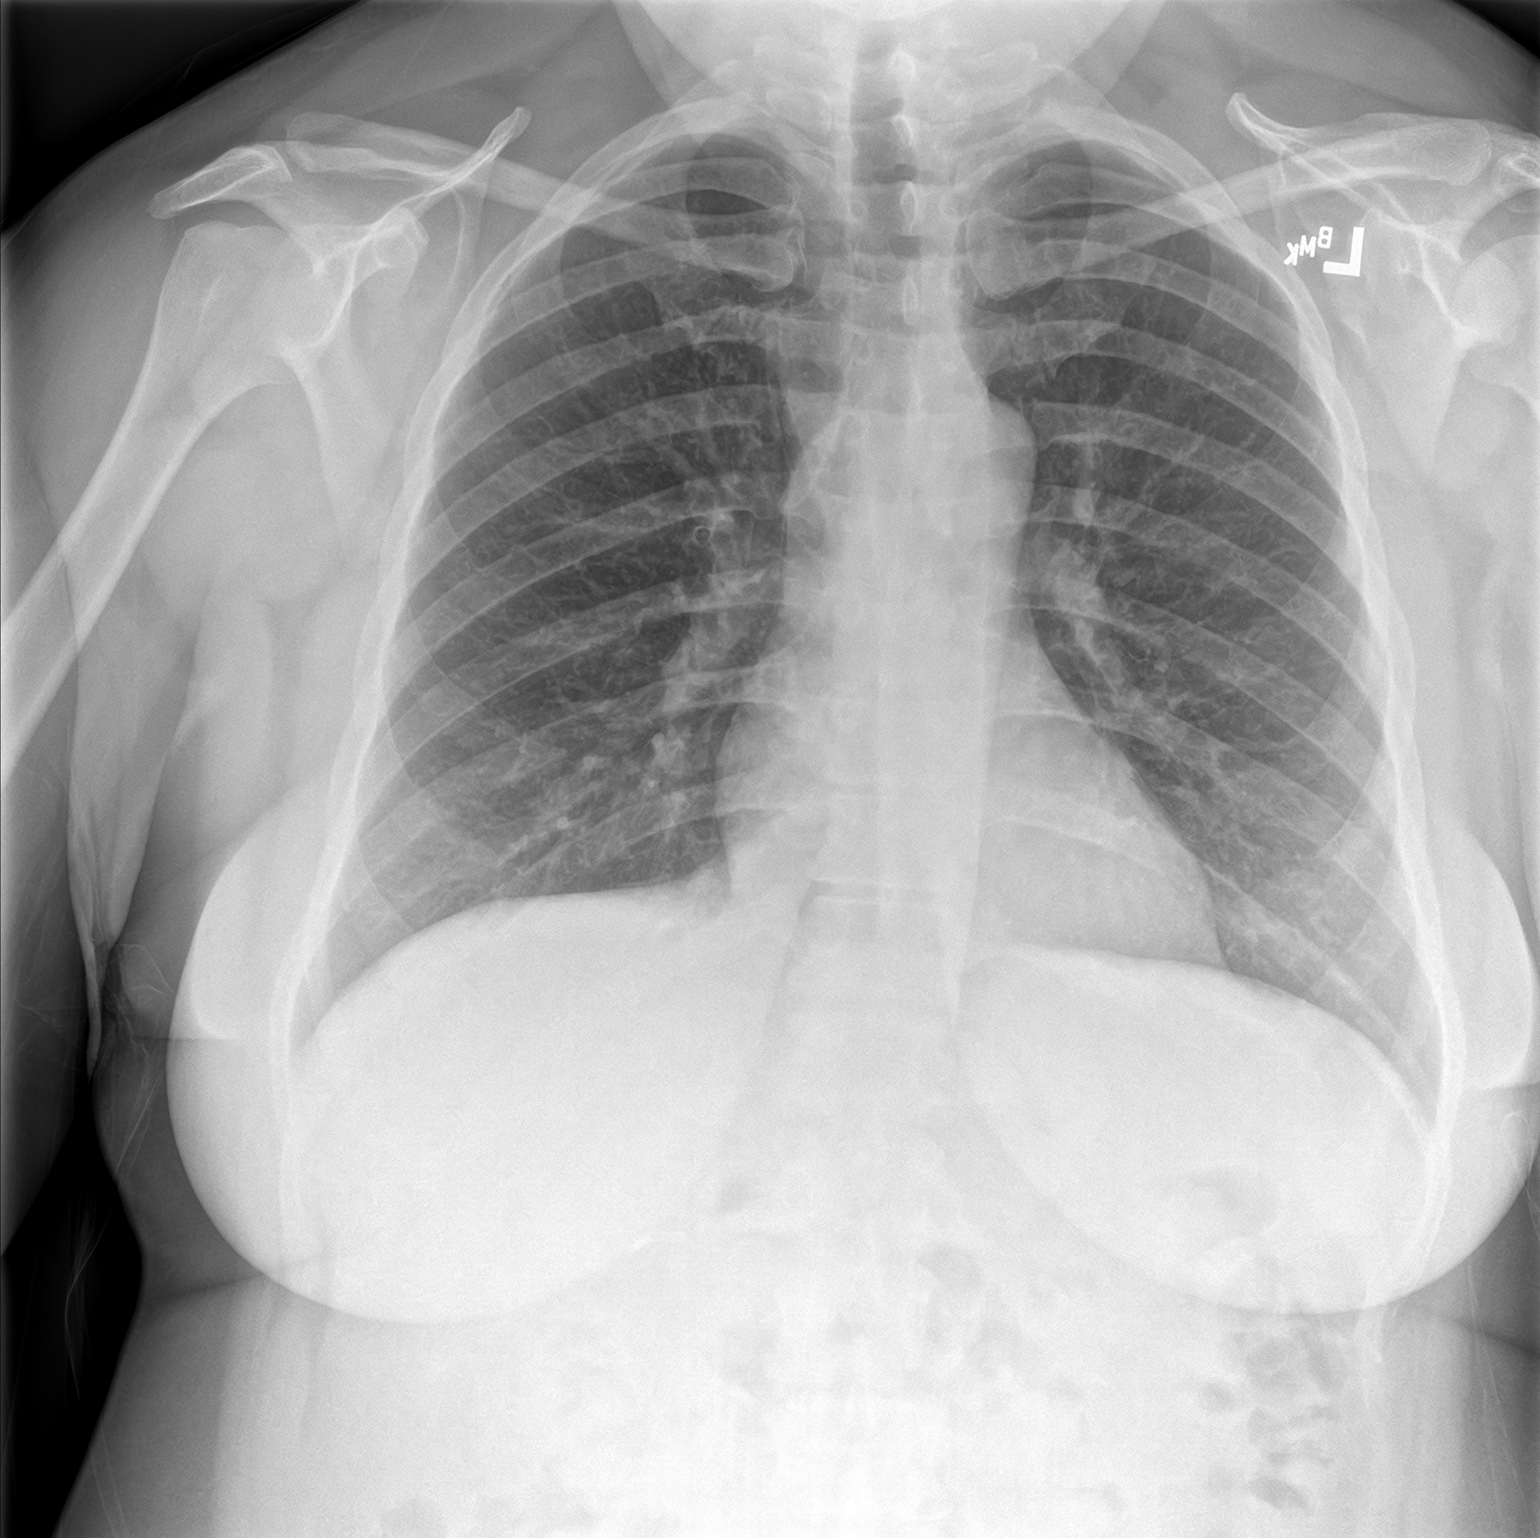
[im 2/2]
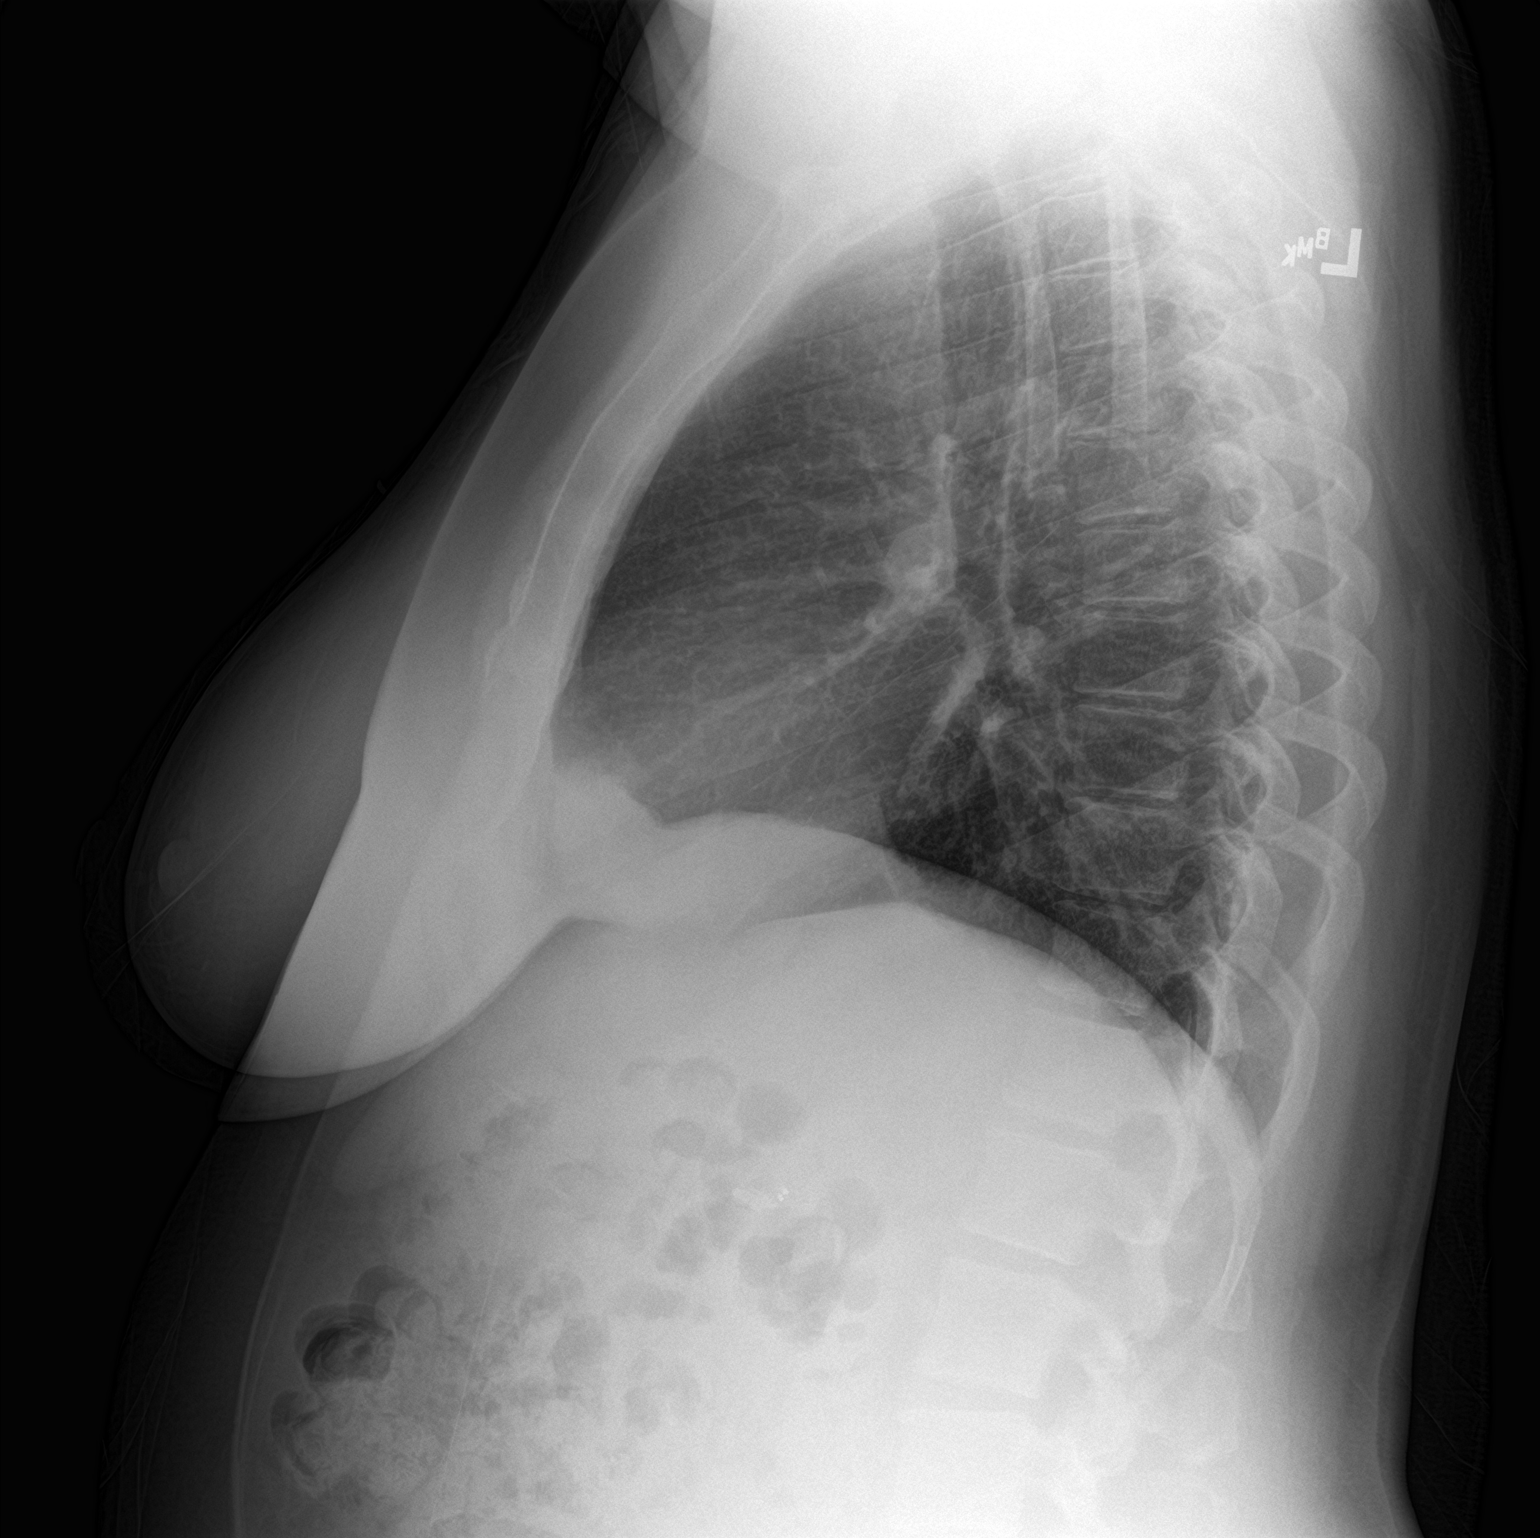

[2 of 2 positions shown; findings below may reference images not displayed]

FINDINGS: The cardiomediastinal contours are normal. The lungs are clear.
Pulmonary vasculature is normal. No consolidation, pleural effusion,
or pneumothorax. No acute osseous abnormalities are seen.
IMPRESSION: Unremarkable radiographs of the chest.

## 2018-10-31 NOTE — ED Triage Notes (Signed)
Pt to triage via wheelchair. Pt reports midsternal chest pain for about 1 hour. Pt reports radiates into her back. +SHOB, nausea, dizziness and diaphoresis with the pain.

## 2018-11-01 ENCOUNTER — Emergency Department: Payer: Medicaid Other

## 2018-11-01 ENCOUNTER — Emergency Department
Admission: EM | Admit: 2018-11-01 | Discharge: 2018-11-01 | Disposition: A | Discharge status: Home / Self Care | Payer: Medicaid Other | Attending: Emergency Medicine | Admitting: Emergency Medicine

## 2018-11-01 DIAGNOSIS — R079 Chest pain, unspecified: Secondary | ICD-10-CM

## 2018-11-01 LAB — POCT PREGNANCY, URINE: Preg Test, Ur: NEGATIVE

## 2018-11-01 LAB — BASIC METABOLIC PANEL
Anion gap: 10 (ref 5–15)
BUN: 8 mg/dL (ref 6–20)
CO2: 22 mmol/L (ref 22–32)
Calcium: 9.1 mg/dL (ref 8.9–10.3)
Chloride: 104 mmol/L (ref 98–111)
Creatinine, Ser: 0.53 mg/dL (ref 0.44–1.00)
GFR calc Af Amer: >60 mL/min (ref >60)
GFR calc non Af Amer: >60 mL/min (ref >60)
Glucose, Bld: 101 mg/dL — ABNORMAL HIGH (ref 70–99)
Potassium: 3.6 mmol/L (ref 3.5–5.1)
Sodium: 136 mmol/L (ref 135–145)

## 2018-11-01 LAB — TROPONIN I
Troponin I: <0.03 ng/mL (ref <0.03)
Troponin I: <0.03 ng/mL (ref <0.03)

## 2018-11-01 IMAGING — CT CT ANGIO CHEST
2 of 6 series · 19 of 46 positions shown · IV contrast (APPLIED)
Comparison: Radiographs earlier this day. Chest CT [DATE]

CLINICAL DATA: Chest pain, complex, intermediate/high prob of
ACS/PE/AAS

EXAM:
CT ANGIOGRAPHY CHEST WITH CONTRAST
TECHNIQUE: Multidetector CT imaging of the chest was performed using the
standard protocol during bolus administration of intravenous
contrast. Multiplanar CT image reconstructions and MIPs were
obtained to evaluate the vascular anatomy.
CONTRAST:  75mL OMNIPAQUE IOHEXOL 350 MG/ML SOLN

[Series 5: thins · axial · 0.68mm/px · z∈[-583,-352]mm · 16 of 254 slices shown]
[im 12/254  lung]
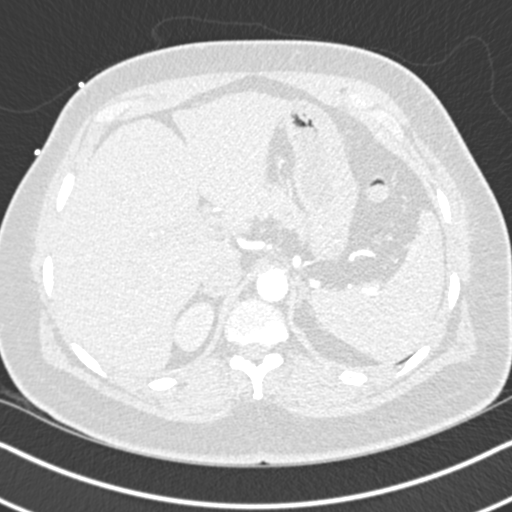
[im 34/254  soft-tissue]
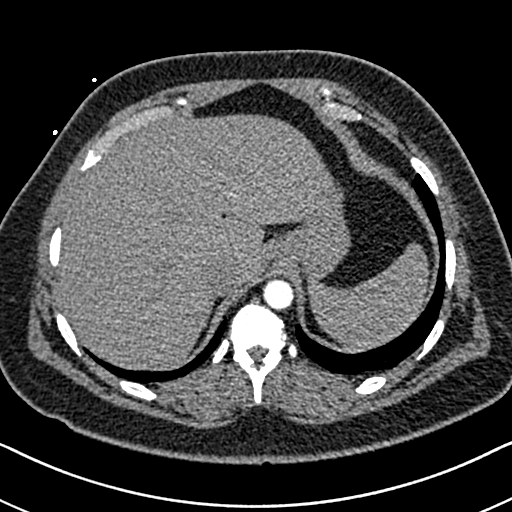
[im 45/254  lung]
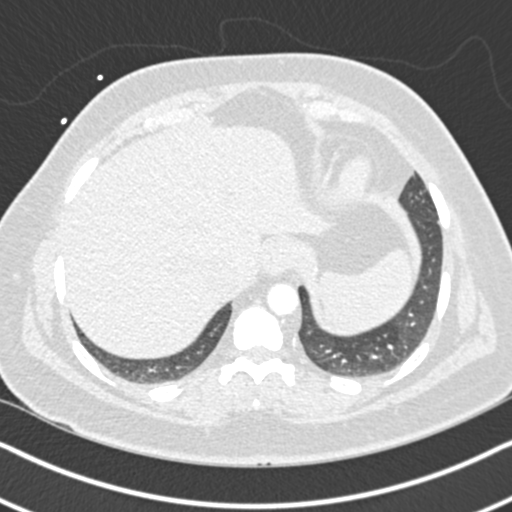
[im 56/254  soft-tissue]
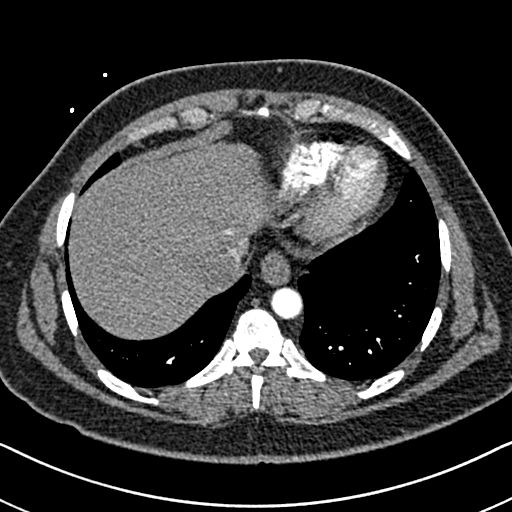
[im 78/254  lung]
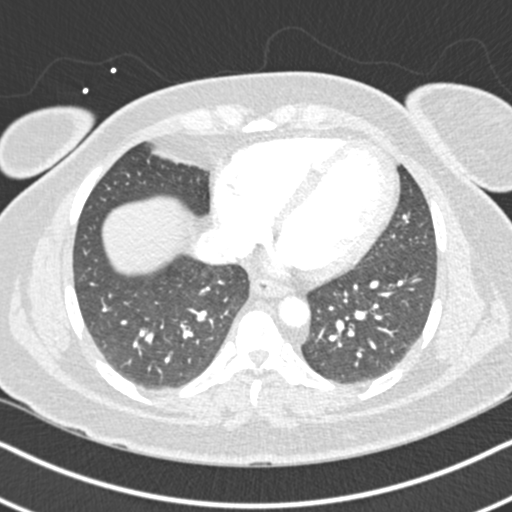
[im 89/254  soft-tissue]
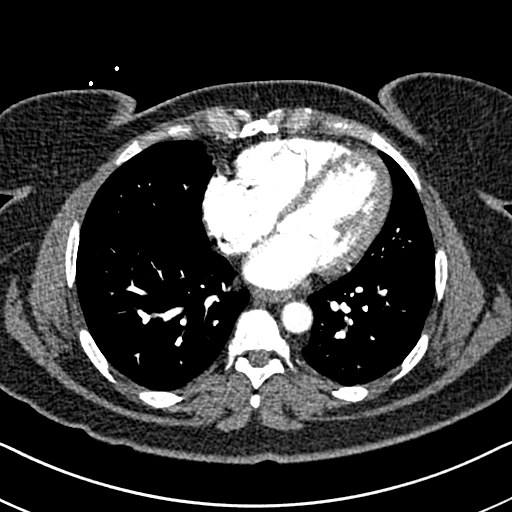
[im 100/254  lung]
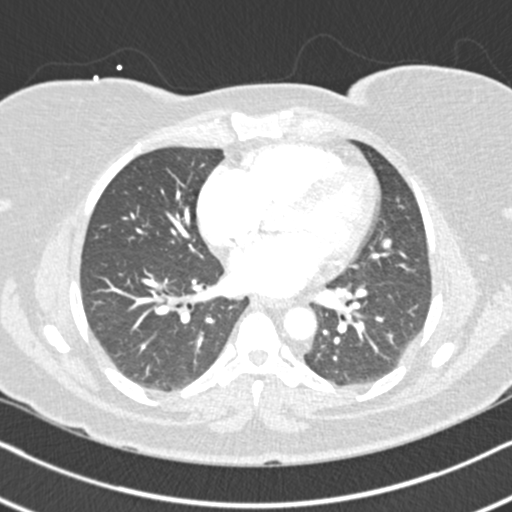
[im 122/254  soft-tissue]
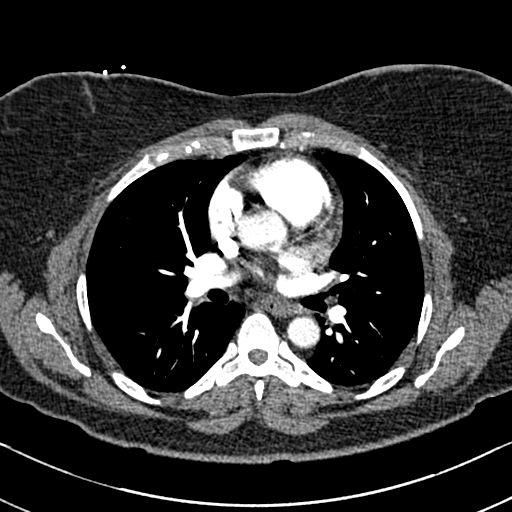
[im 133/254  lung]
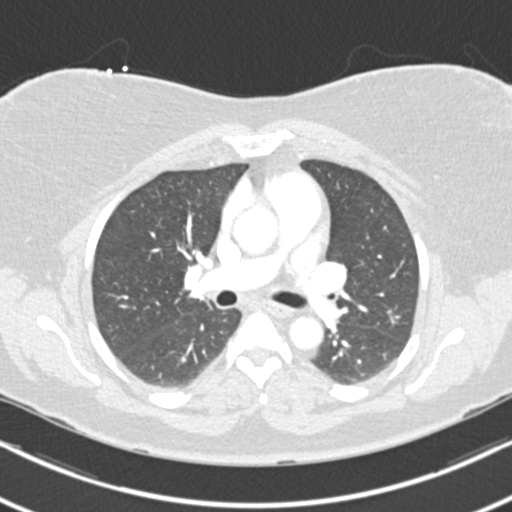
[im 155/254  soft-tissue]
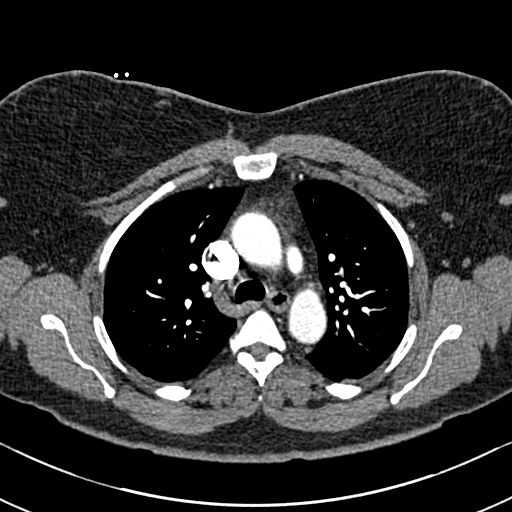
[im 166/254  lung]
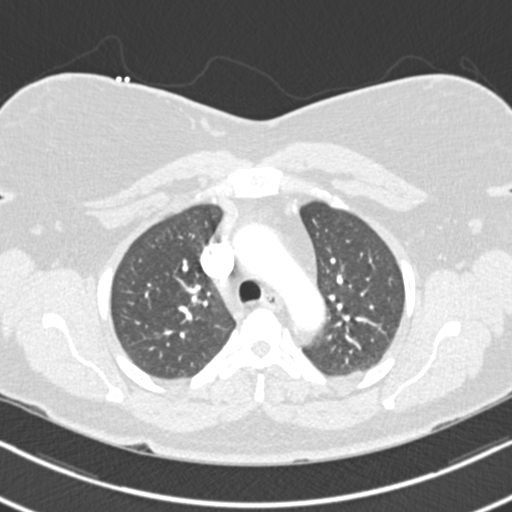
[im 177/254  soft-tissue]
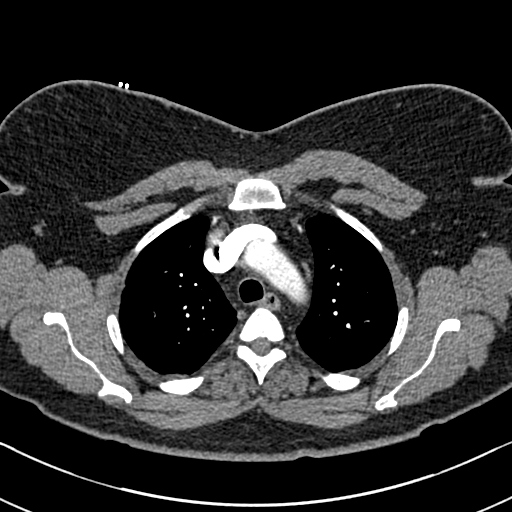
[im 199/254  lung]
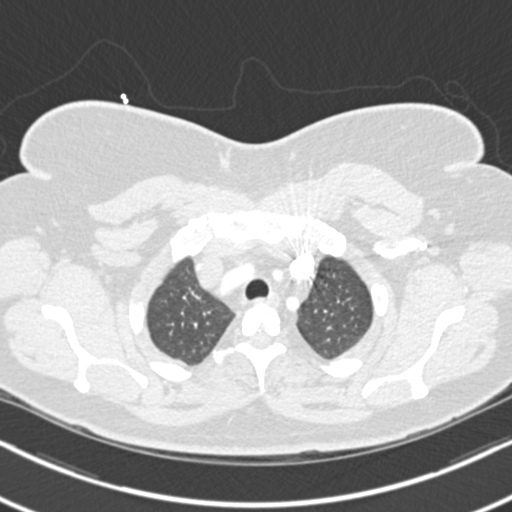
[im 210/254  soft-tissue]
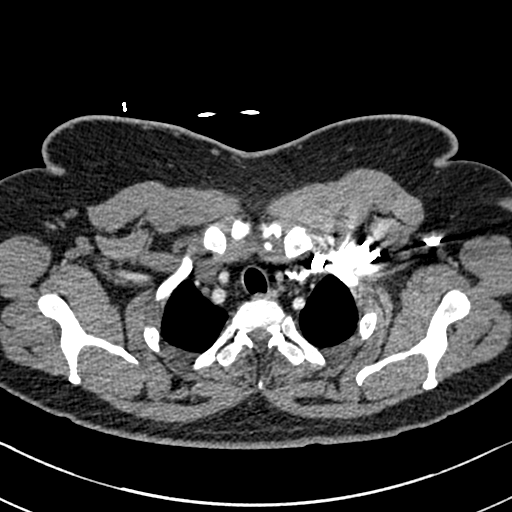
[im 221/254  lung]
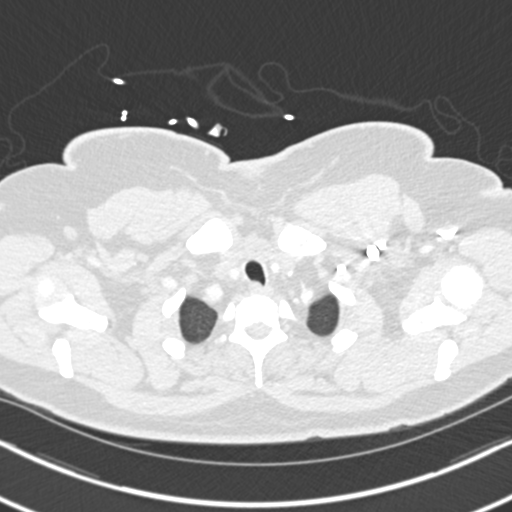
[im 243/254  soft-tissue]
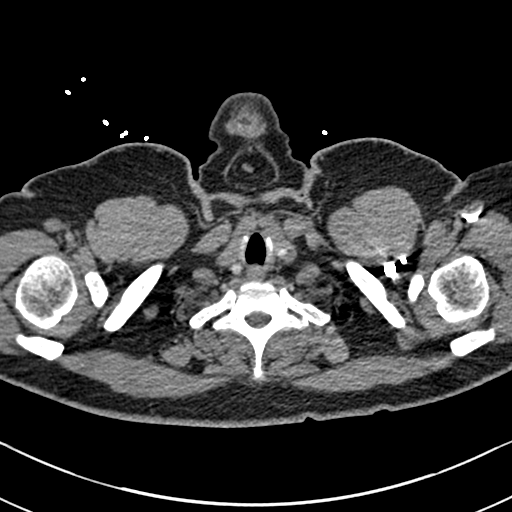

[Series 7: coronal mpr · coronal · 0.53mm/px · 3 of 79 slices shown]
[im 20/79  soft-tissue]
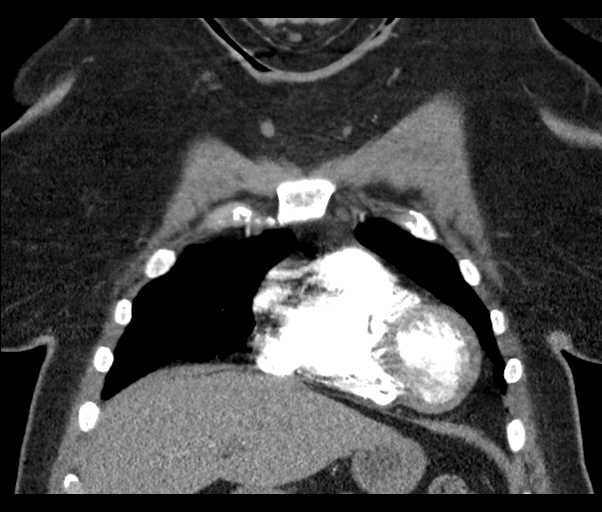
[im 40/79  soft-tissue]
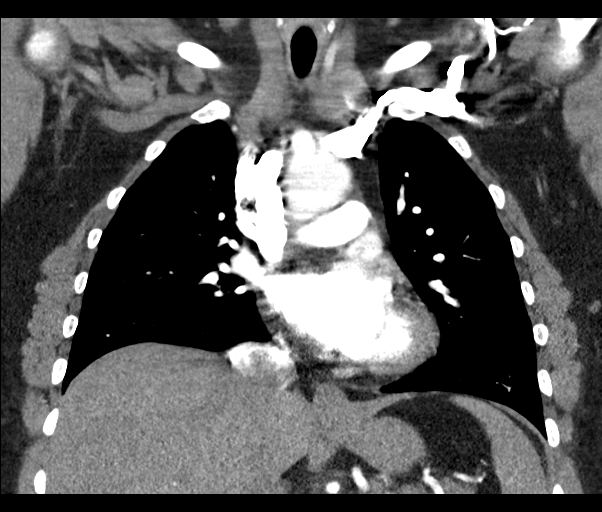
[im 59/79  soft-tissue]
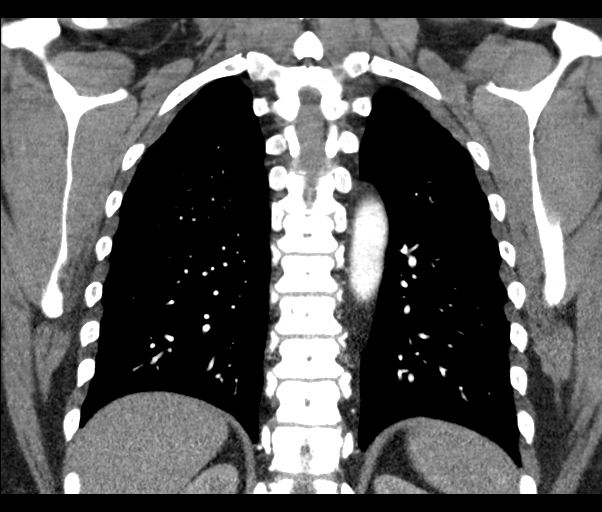

[19 of 46 positions shown; findings below may reference images not displayed]

FINDINGS: Cardiovascular: There are no filling defects within the pulmonary
arteries to suggest pulmonary embolus. The thoracic aorta is normal
in caliber without dissection. Heart is normal size. No pericardial
effusion.

Mediastinum/Nodes: No enlarged mediastinal or hilar lymph nodes.
Small hiatal hernia. Post thyroidectomy.

Lungs/Pleura: Mild lower lobe bronchial thickening with scattered
mucous plugging on the right. No focal airspace disease. Pulmonary
edema or pleural fluid. Pulmonary mass. Tiny pleural-based nodule in
the left lower lobe is unchanged from [A3] and considered benign.

Upper Abdomen: No acute findings.

Musculoskeletal: There are no acute or suspicious osseous
abnormalities.

Review of the MIP images confirms the above findings.
IMPRESSION: 1. No pulmonary embolus.
2. Mild lower lobe bronchial thickening with scattered mucous
plugging.

## 2018-11-01 MED ORDER — LIDOCAINE VISCOUS HCL 2 % MT SOLN
15.0000 mL | Freq: Once | OROMUCOSAL | Status: AC
  Administered 2018-11-01: 15 mL via ORAL
  Filled 2018-11-01: qty 15

## 2018-11-01 MED ORDER — ALUM & MAG HYDROXIDE-SIMETH 200-200-20 MG/5ML PO SUSP
30.0000 mL | Freq: Once | ORAL | Status: AC
  Administered 2018-11-01: 30 mL via ORAL
  Filled 2018-11-01: qty 30

## 2018-11-01 MED ORDER — IOHEXOL 350 MG/ML SOLN
75.0000 mL | Freq: Once | INTRAVENOUS | Status: AC | PRN
  Administered 2018-11-01: 75 mL via INTRAVENOUS

## 2018-11-01 MED ORDER — OMEPRAZOLE 40 MG PO CPDR: 40.0000 mg | DELAYED_RELEASE_CAPSULE | Freq: Every day | ORAL | 0 refills | Status: DC

## 2018-11-01 NOTE — ED Provider Notes (Signed)
[facility] Emergency Department Provider Note    First MD Initiated Contact with Patient 11/01/18 0017     (approximate)  I have reviewed the triage vital signs and the nursing notes.   HISTORY  Chief Complaint Chest Pain   HPI Morgan Stevens is a 53 y.o. female with below list of chronic medical conditions presents to the emergency department with cute onset of midsternal chest pain which patient describes as "just feels like pain".  Patient states that the pain radiates to her back with accompanying shortness of breath nausea and dizziness.  Patient also admits to diaphoresis.  Patient denies any lower extremity pain or swelling.  Patient denies any history of DVT or PE.       Past Medical History:  Diagnosis Date  . Gastritis   . Hiatal hernia   . Obesity (BMI 30-39.9)   . Reflux   . Thyroid disease     Patient Active Problem List   Diagnosis Date Noted  . Pain in the chest   . Abnormal cardiac function test 02/26/2016  . Obesity (BMI 30-39.9)   . Angina pectoris (HCC) 02/25/2016  . Dyspnea 02/25/2016  . Hypothyroidism 02/25/2016  . Chest pain 09/07/2015    Past Surgical History:  Procedure Laterality Date  . CARDIAC CATHETERIZATION  02/27/2016   Procedure: Left Heart Cath and Coronary Angiography;  Surgeon: Corky Crafts, MD;  Location: [facility] INVASIVE CV LAB;  Service: Cardiovascular;;  . CESAREAN SECTION    . CHOLECYSTECTOMY    . THYROIDECTOMY    . THYROIDECTOMY      Prior to Admission medications   Medication Sig Start Date End Date Taking? Authorizing Provider  amoxicillin (AMOXIL) 500 MG capsule Take 1 capsule (500 mg total) by mouth 3 (three) times daily. Patient not taking: Reported on 02/23/2017 12/26/16   Joni Reining, PA-C  cyclobenzaprine (FLEXERIL) 5 MG tablet Take 1 tablet (5 mg total) by mouth 3 (three) times daily as needed for muscle spasms. 05/18/17   Menshew, Charlesetta Ivory, PA-C  gabapentin (NEURONTIN) 300 MG  capsule Take 1 capsule (300 mg total) by mouth 3 (three) times daily. 03/16/17   Loleta Rose, MD  HYDROcodone-acetaminophen (NORCO) 5-325 MG tablet Take 1 tablet by mouth every 4 (four) hours as needed for moderate pain. 05/05/18   Willy Eddy, MD  ibuprofen (ADVIL,MOTRIN) 600 MG tablet Take 1 tablet (600 mg total) by mouth every 8 (eight) hours as needed. Patient not taking: Reported on 02/23/2017 12/26/16   Joni Reining, PA-C  levothyroxine (SYNTHROID, LEVOTHROID) 137 MCG tablet Take 137 mcg by mouth daily before breakfast.    [provider]  lidocaine (XYLOCAINE) 2 % solution Use as directed 5 mLs in the mouth or throat every 6 (six) hours as needed for mouth pain. Patient not taking: Reported on 02/23/2017 12/26/16   Joni Reining, PA-C  meclizine (ANTIVERT) 25 MG tablet Take 1 tablet (25 mg total) by mouth 3 (three) times daily as needed for dizziness. 06/16/17   Dionne Bucy, MD  meloxicam (MOBIC) 15 MG tablet Take 1 tablet (15 mg total) by mouth daily. 07/11/18   Triplett, Rulon Eisenmenger B, FNP  nabumetone (RELAFEN) 750 MG tablet Take 1 tablet (750 mg total) by mouth 2 (two) times daily. 05/18/17   Menshew, Charlesetta Ivory, PA-C  omeprazole (PRILOSEC) 20 MG capsule Take 20 mg by mouth 2 (two) times daily before a meal.     [provider]  omeprazole (PRILOSEC) 40  MG capsule Take 1 capsule (40 mg total) by mouth daily for 30 days. 11/01/18 12/01/18  Darci Current, MD  ranitidine (ZANTAC) 150 MG tablet Take 1 tablet (150 mg total) by mouth 2 (two) times daily. 07/28/16 07/28/17  Willy Eddy, MD  traMADol (ULTRAM) 50 MG tablet Take 1 tablet (50 mg total) by mouth every 6 (six) hours as needed. 07/11/18   Chinita Pester, FNP    Allergies Patient has no known allergies.  Family History  Problem Relation Age of Onset  . Breast cancer Neg Hx     Social History Social History   Tobacco Use  . Smoking status: Never Smoker  . Smokeless tobacco: Never Used    Substance Use Topics  . Alcohol use: No    Alcohol/week: 0.0 standard drinks  . Drug use: No    Review of Systems Constitutional: No fever/chills Eyes: No visual changes. ENT: No sore throat. Cardiovascular: Positive for chest pain. Respiratory: Denies shortness of breath. Gastrointestinal: No abdominal pain.  No nausea, no vomiting.  No diarrhea.  No constipation. Genitourinary: Negative for dysuria. Musculoskeletal: Negative for neck pain.  Negative for back pain. Integumentary: Negative for rash. Neurological: Negative for headaches, focal weakness or numbness.  ____________________________________________   PHYSICAL EXAM:  VITAL SIGNS: ED Triage Vitals  Enc Vitals Group     BP 11/01/18 0045 134/81     Pulse Rate 11/01/18 0045 63     Resp 11/01/18 0045 18     Temp --      Temp src --      SpO2 11/01/18 0045 98 %     Weight 10/31/18 2325 98.4 kg (217 lb)     Height 10/31/18 2325 1.524 m (5')     Head Circumference --      Peak Flow --      Pain Score 10/31/18 2325 8     Pain Loc --      Pain Edu? --      Excl. in GC? --     Constitutional: Alert and oriented. Well appearing and in no acute distress. Eyes: Conjunctivae are normal.  Mouth/Throat: Mucous membranes are moist.  Oropharynx non-erythematous. Neck: No stridor.  Cardiovascular: Normal rate, regular rhythm. Good peripheral circulation. Grossly normal heart sounds. Respiratory: Normal respiratory effort.  No retractions. Lungs CTAB. Gastrointestinal: Soft and nontender. No distention.  Musculoskeletal: No lower extremity tenderness nor edema. No gross deformities of extremities. Neurologic:  Normal speech and language. No gross focal neurologic deficits are appreciated.  Skin:  Skin is warm, dry and intact. No rash noted. Psychiatric: Mood and affect are normal. Speech and behavior are normal.  ____________________________________________   LABS (all labs ordered are listed, but only abnormal results  are displayed)  Labs Reviewed  BASIC METABOLIC PANEL - Abnormal; Notable for the following components:      Result Value   Glucose, Bld 101 (*)    All other components within normal limits  CBC - Abnormal; Notable for the following components:   Hemoglobin 11.7 (*)    HCT 35.1 (*)    All other components within normal limits  TROPONIN I  TROPONIN I  POC URINE PREG, ED  POCT PREGNANCY, URINE   ____________________________________________  EKG  ED ECG REPORT I, [place] Deeanne Deininger, the attending physician, personally viewed and interpreted this ECG.   Date: 10/31/2018  EKG Time: 11:35 PM  Rate: 79  Rhythm: Normal sinus rhythm  Axis: Normal  Intervals: Normal  ST&T Change: None  ____________________________________________  RADIOLOGY I, Darci Current, personally viewed and evaluated these images (plain radiographs) as part of my medical decision making, as well as reviewing the written report by the radiologist.  ED MD interpretation: Chest x-ray unremarkable per radiologist.  CT chest likewise revealed no evidence of pulmonary emboli  Official radiology report(s): Dg Chest 2 View  Result Date: 11/01/2018 CLINICAL DATA:  Midsternal chest pain. EXAM: CHEST - 2 VIEW COMPARISON:  Radiographs 08/16/2018, additional priors FINDINGS: The cardiomediastinal contours are normal. The lungs are clear. Pulmonary vasculature is normal. No consolidation, pleural effusion, or pneumothorax. No acute osseous abnormalities are seen. IMPRESSION: Unremarkable radiographs of the chest. Electronically Signed   By: Narda Rutherford M.D.   On: 11/01/2018 00:11   Ct Angio Chest Pe W And/or Wo Contrast  Result Date: 11/01/2018 CLINICAL DATA:  Chest pain, complex, intermediate/high prob of ACS/PE/AAS EXAM: CT ANGIOGRAPHY CHEST WITH CONTRAST TECHNIQUE: Multidetector CT imaging of the chest was performed using the standard protocol during bolus administration of intravenous contrast. Multiplanar CT image  reconstructions and MIPs were obtained to evaluate the vascular anatomy. CONTRAST:  31mL OMNIPAQUE IOHEXOL 350 MG/ML SOLN COMPARISON:  Radiographs earlier this day. Chest CT 09/06/2015 FINDINGS: Cardiovascular: There are no filling defects within the pulmonary arteries to suggest pulmonary embolus. The thoracic aorta is normal in caliber without dissection. Heart is normal size. No pericardial effusion. Mediastinum/Nodes: No enlarged mediastinal or hilar lymph nodes. Small hiatal hernia. Post thyroidectomy. Lungs/Pleura: Mild lower lobe bronchial thickening with scattered mucous plugging on the right. No focal airspace disease. Pulmonary edema or pleural fluid. Pulmonary mass. Tiny pleural-based nodule in the left lower lobe is unchanged from 2017 and considered benign. Upper Abdomen: No acute findings. Musculoskeletal: There are no acute or suspicious osseous abnormalities. Review of the MIP images confirms the above findings. IMPRESSION: 1. No pulmonary embolus. 2. Mild lower lobe bronchial thickening with scattered mucous plugging. Electronically Signed   By: Narda Rutherford M.D.   On: 11/01/2018 01:38      Procedures   ____________________________________________   INITIAL IMPRESSION / MDM / ASSESSMENT AND PLAN / ED COURSE  As part of my medical decision making, I reviewed the following data within the electronic MEDICAL RECORD NUMBER   53 year old female presented with above-stated history and physical exam secondary to central chest pain.  Differential diagnosis includes CAD/MI, PE, GERD.  Laboratory data unremarkable including troponin x2 EKG revealed no evidence of ischemia or infarction.  Patient given GI cocktail with complete resolution of chest pain. ____________________________________________  FINAL CLINICAL IMPRESSION(S) / ED DIAGNOSES  Final diagnoses:  Chest pain, unspecified type     MEDICATIONS GIVEN DURING THIS VISIT:  Medications  iohexol (OMNIPAQUE) 350 MG/ML injection  75 mL (75 mLs Intravenous Contrast Given 11/01/18 0109)  alum & mag hydroxide-simeth (MAALOX/MYLANTA) 200-200-20 MG/5ML suspension 30 mL (30 mLs Oral Given 11/01/18 0138)    And  lidocaine (XYLOCAINE) 2 % viscous mouth solution 15 mL (15 mLs Oral Given 11/01/18 0138)     ED Discharge Orders         Ordered    omeprazole (PRILOSEC) 40 MG capsule  Daily     11/01/18 0414           Note:  This document was prepared using Dragon voice recognition software and may include unintentional dictation errors.   Darci Current, MD 11/01/18 [phone]

## 2018-11-07 IMAGING — US US EXTREM LOW VENOUS*L*
1 series · 13 of 24 positions shown · non-contrast
Comparison: Lower extremity duplex [DATE], [DATE]

CLINICAL DATA: Left lower extremity pain.



[Series 1: us extrem low venous*left* · 0.07mm/px · 13 of 32 slices shown]
[im 1/32]
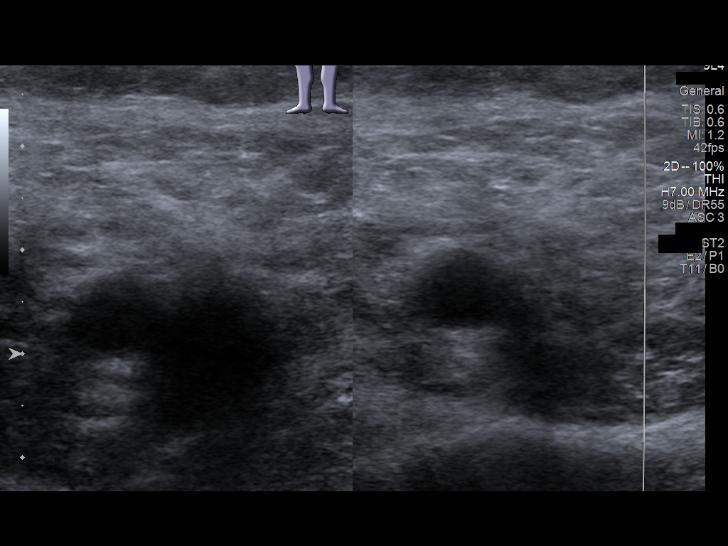
[im 3/32]
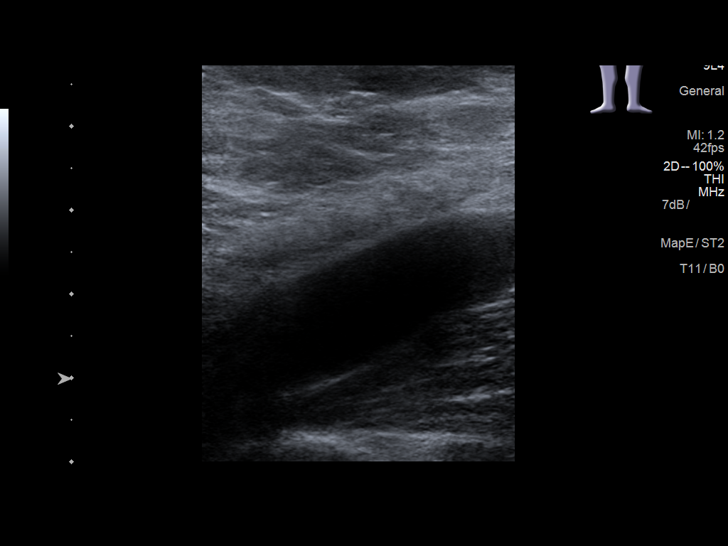
[im 6/32]
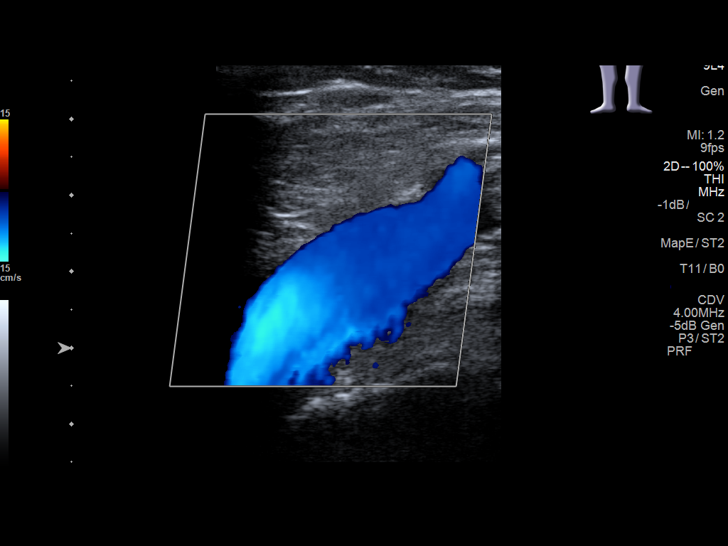
[im 9/32]
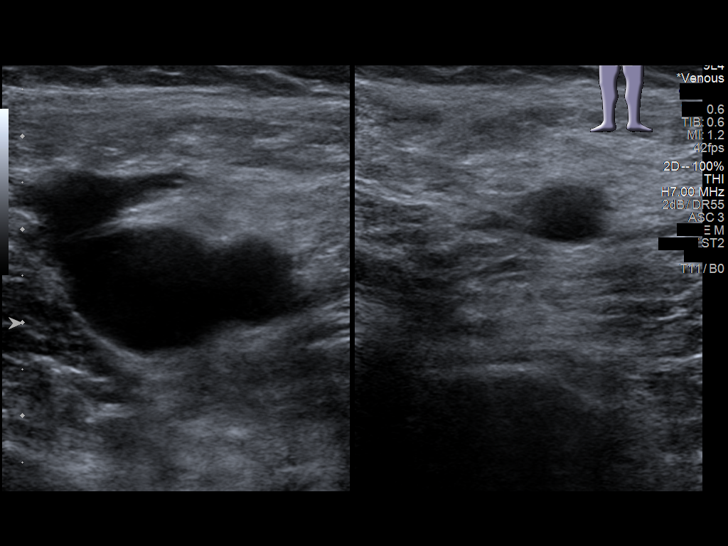
[im 11/32]
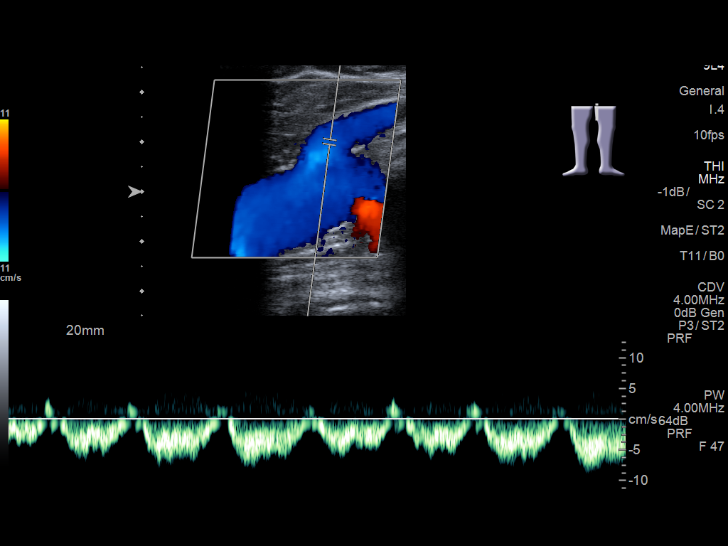
[im 14/32]
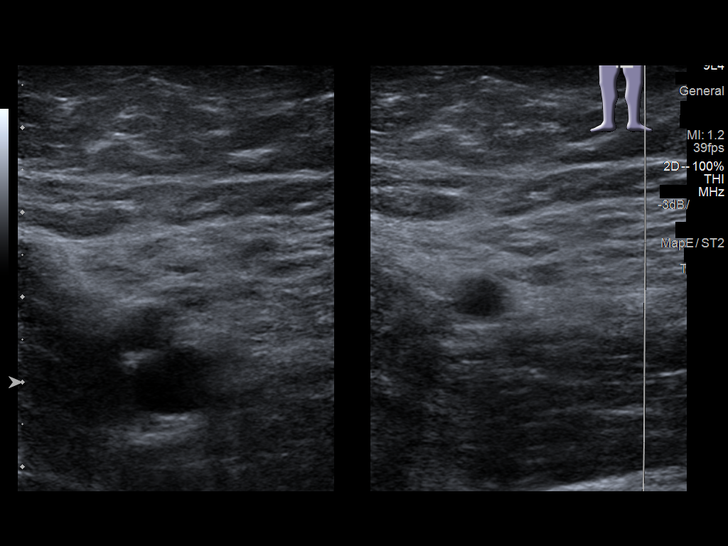
[im 17/32]
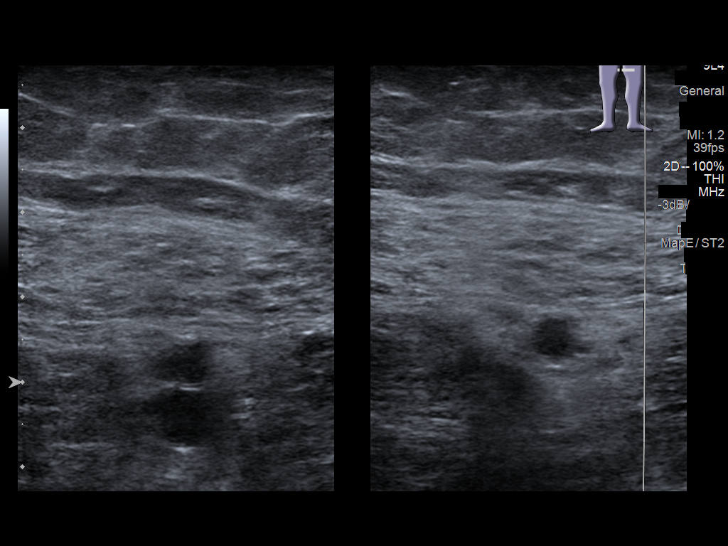
[im 18/32]
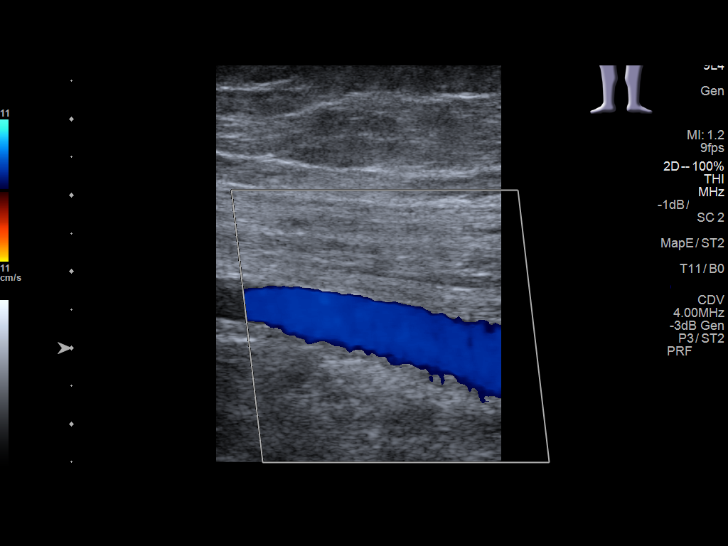
[im 21/32]
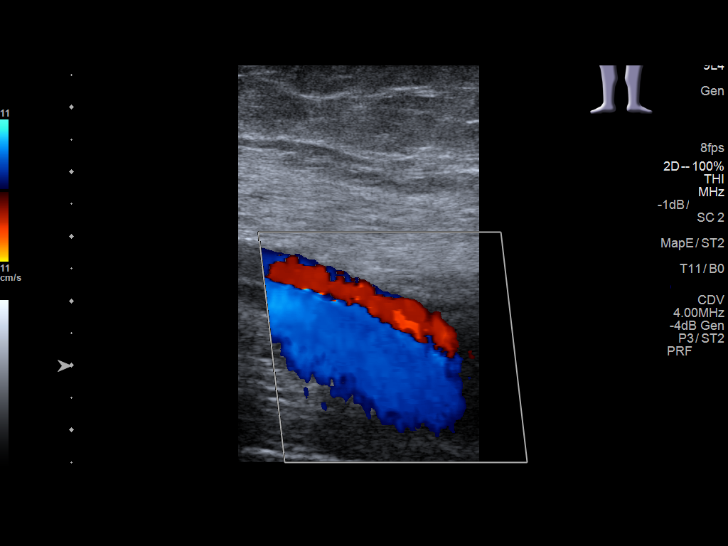
[im 23/32]
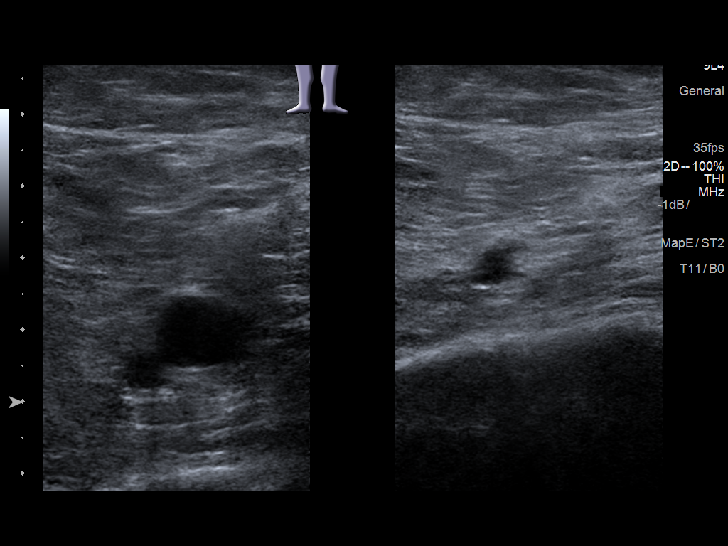
[im 26/32]
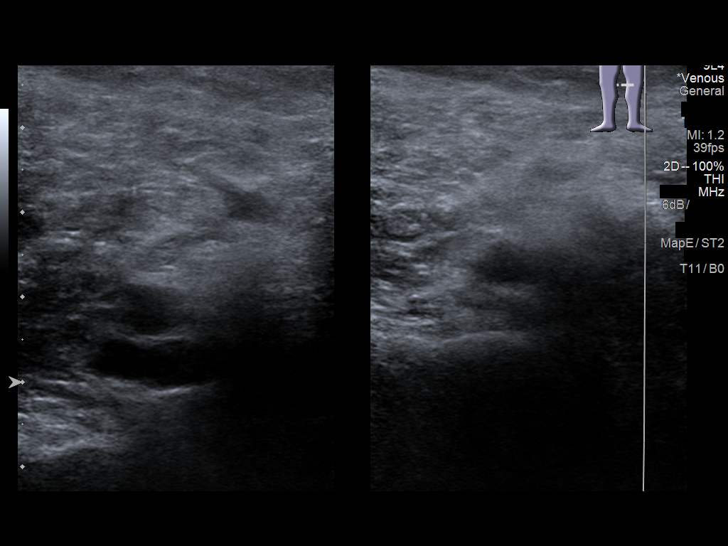
[im 29/32]
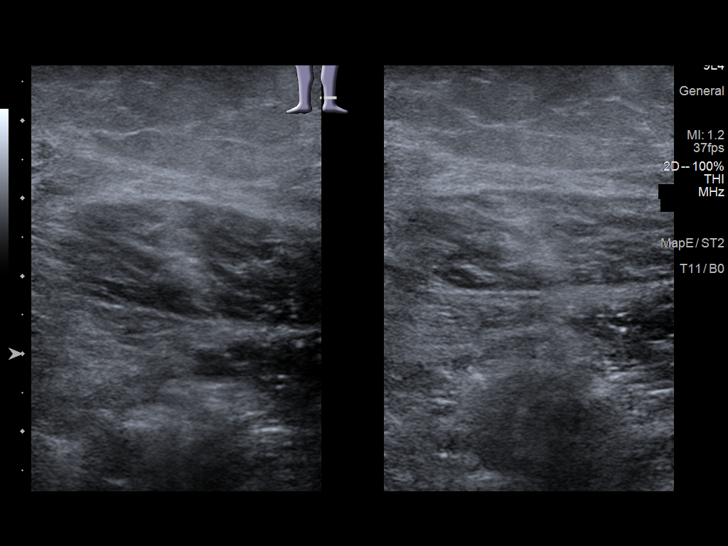
[im 32/32]
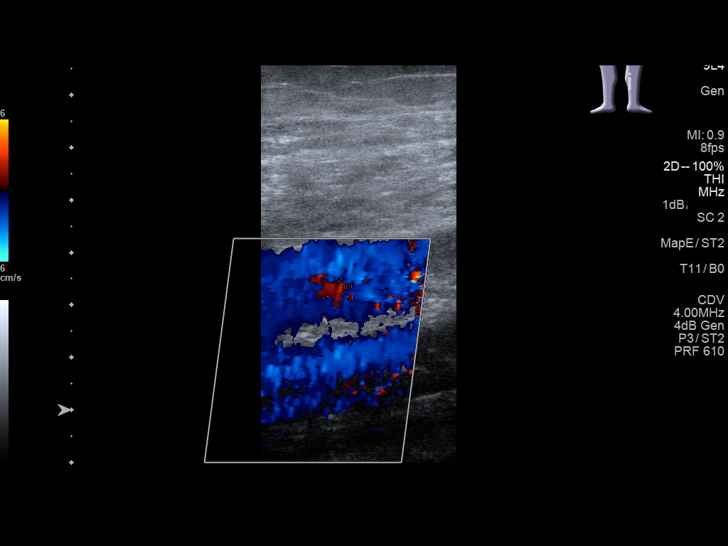

[13 of 24 positions shown; findings below may reference images not displayed]

FINDINGS: Contralateral Common Femoral Vein: Respiratory phasicity is normal
and symmetric with the symptomatic side. No evidence of thrombus.
Normal compressibility.

Common Femoral Vein: No evidence of thrombus. Normal
compressibility, respiratory phasicity and response to augmentation.

Saphenofemoral Junction: No evidence of thrombus. Normal
compressibility and flow on color Doppler imaging.

Profunda Femoral Vein: No evidence of thrombus. Normal
compressibility and flow on color Doppler imaging.

Femoral Vein: No evidence of thrombus. Normal compressibility,
respiratory phasicity and response to augmentation.

Popliteal Vein: No evidence of thrombus. Normal compressibility,
respiratory phasicity and response to augmentation.

Calf Veins: No evidence of thrombus. Normal compressibility and flow
on color Doppler imaging.

Superficial Great Saphenous Vein: No evidence of thrombus. Normal
compressibility and flow on color Doppler imaging.

Venous Reflux:  None.

Other Findings:  None.
IMPRESSION: No evidence of DVT within the left lower extremity.

## 2019-01-12 ENCOUNTER — Emergency Department
Admission: EM | Admit: 2019-01-12 | Discharge: 2019-01-13 | Disposition: A | Discharge status: Home / Self Care | Payer: Medicaid Other | Attending: Emergency Medicine | Admitting: Emergency Medicine

## 2019-01-12 ENCOUNTER — Emergency Department: Payer: Medicaid Other

## 2019-01-12 ENCOUNTER — Other Ambulatory Visit: Payer: Self-pay

## 2019-01-12 ENCOUNTER — Encounter: Payer: Self-pay | Admitting: Emergency Medicine

## 2019-01-12 DIAGNOSIS — E039 Hypothyroidism, unspecified: Secondary | ICD-10-CM | POA: Diagnosis not present

## 2019-01-12 DIAGNOSIS — Z79899 Other long term (current) drug therapy: Secondary | ICD-10-CM | POA: Insufficient documentation

## 2019-01-12 DIAGNOSIS — M7989 Other specified soft tissue disorders: Secondary | ICD-10-CM | POA: Diagnosis not present

## 2019-01-12 DIAGNOSIS — M79652 Pain in left thigh: Secondary | ICD-10-CM

## 2019-01-12 IMAGING — US VENOUS DOPPLER ULTRASOUND OF LEFT LOWER EXTREMITY
1 series · 13 of 24 positions shown · non-contrast
Comparison: [DATE] left lower extremity venous ultrasound.

CLINICAL DATA: 52 y/o F; pain and swelling of the left leg for a
few hours tonight.



[Series 1: venous doppler ultrasound of left lower extremity · 0.08mm/px · 13 of 33 slices shown]
[im 1/33]
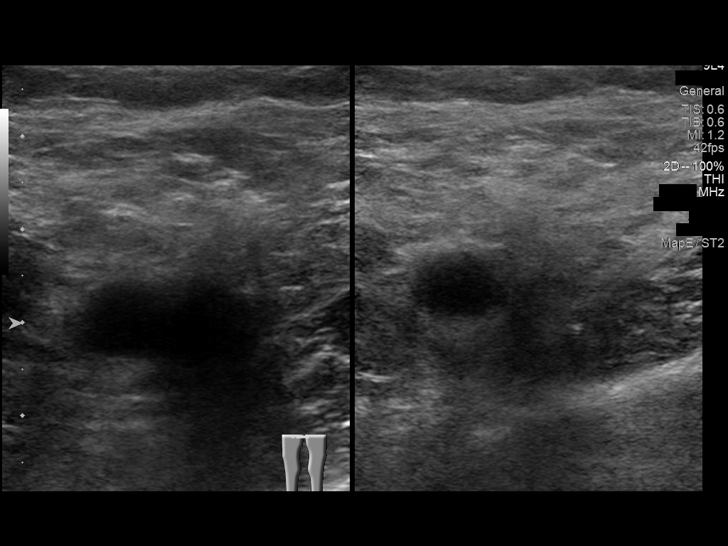
[im 3/33]
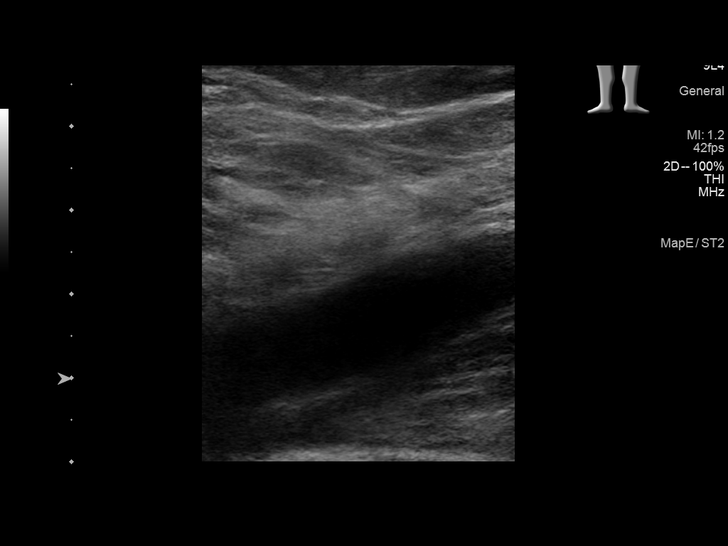
[im 6/33]
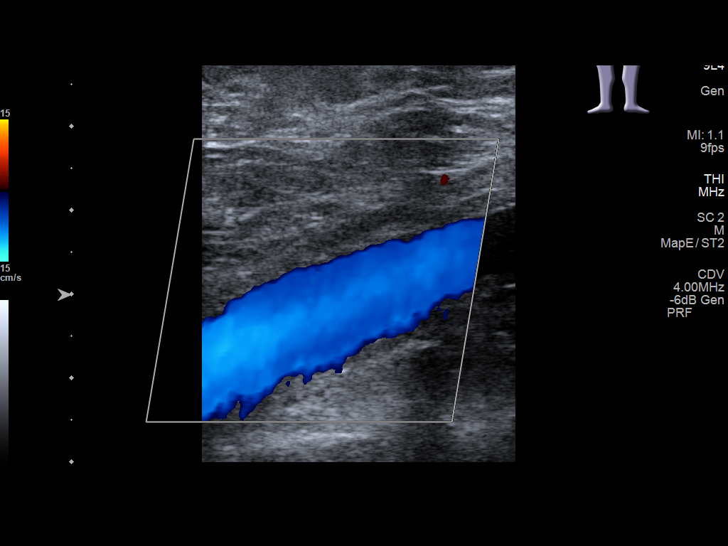
[im 9/33]
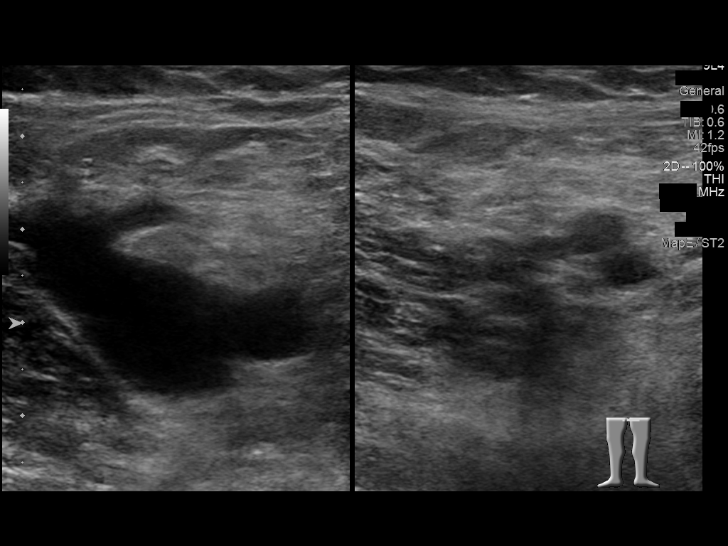
[im 12/33]
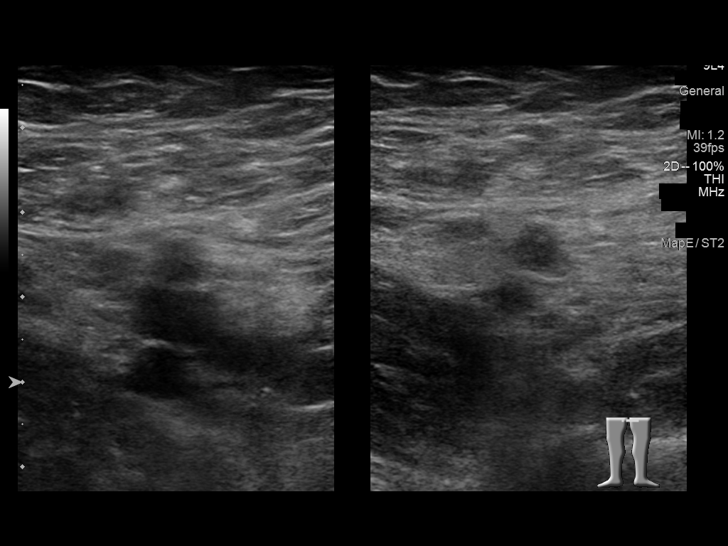
[im 14/33]
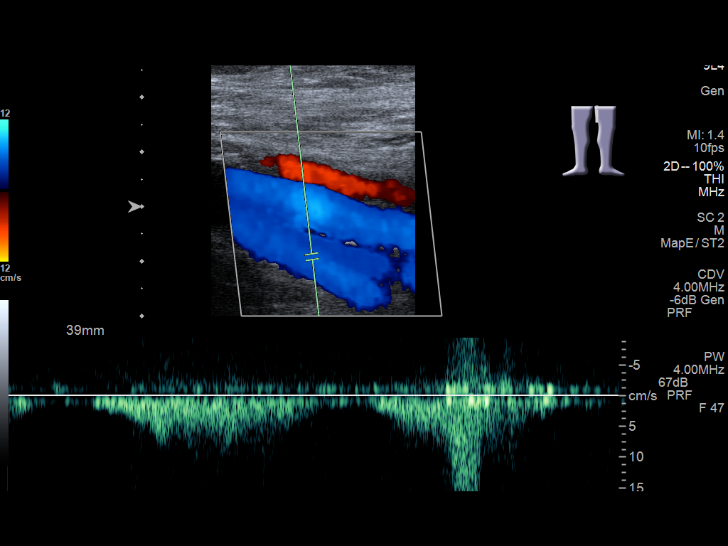
[im 17/33]
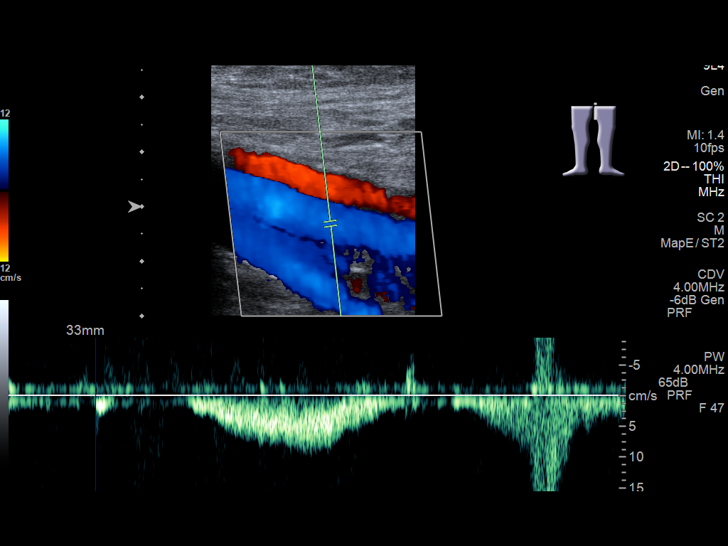
[im 19/33]
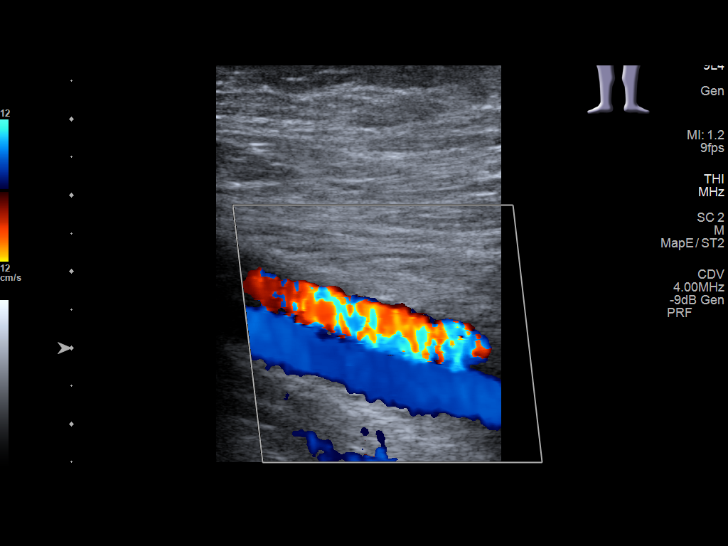
[im 21/33]
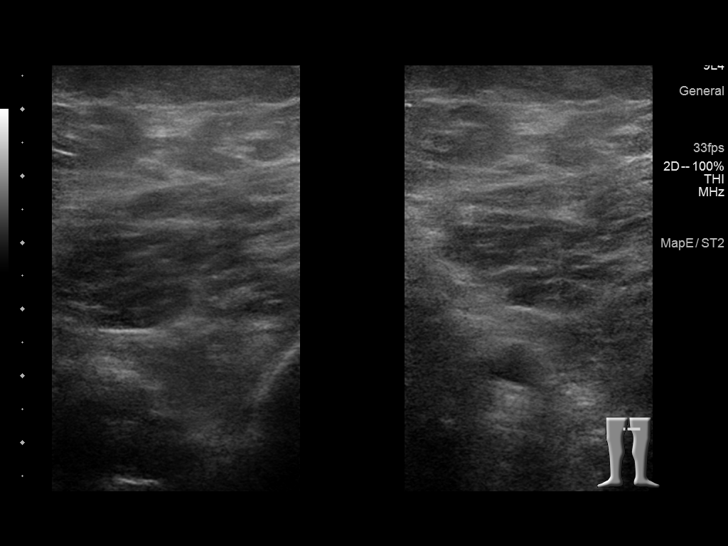
[im 24/33]
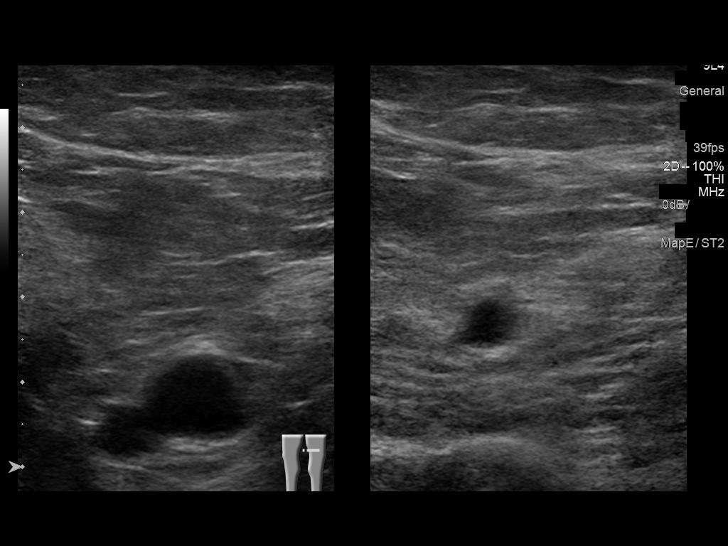
[im 27/33]
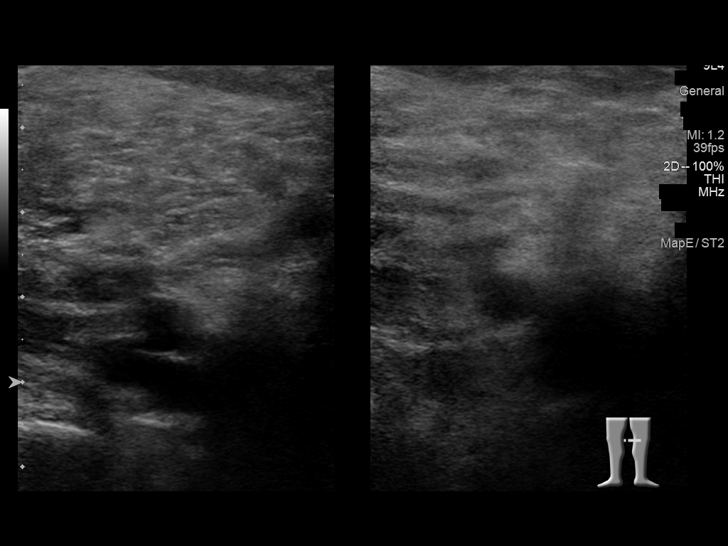
[im 30/33]
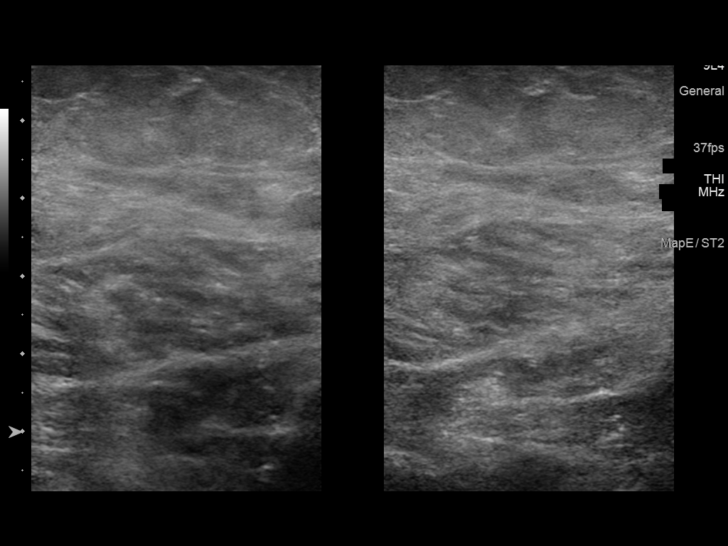
[im 33/33]
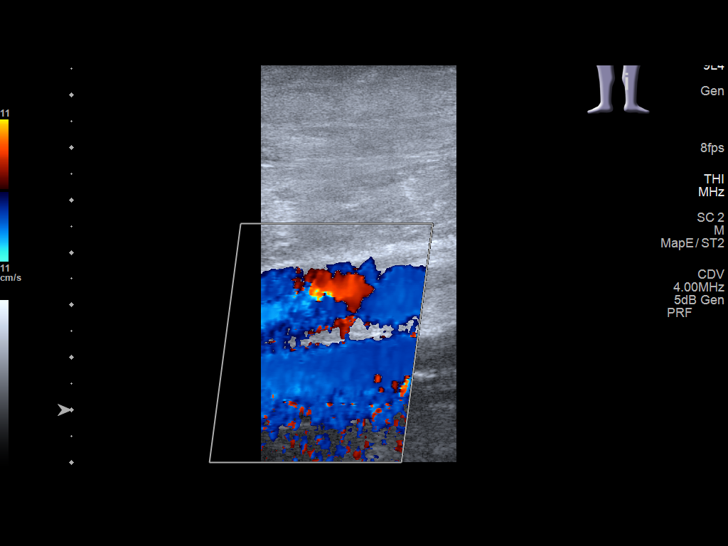

[13 of 24 positions shown; findings below may reference images not displayed]

FINDINGS: Contralateral Common Femoral Vein: Respiratory phasicity is normal
and symmetric with the symptomatic side. No evidence of thrombus.
Normal compressibility.

Common Femoral Vein: No evidence of thrombus. Normal
compressibility, respiratory phasicity and response to augmentation.

Saphenofemoral Junction: No evidence of thrombus. Normal
compressibility and flow on color Doppler imaging.

Profunda Femoral Vein: No evidence of thrombus. Normal
compressibility and flow on color Doppler imaging.

Femoral Vein: No evidence of thrombus. Normal compressibility,
respiratory phasicity and response to augmentation.

Popliteal Vein: No evidence of thrombus. Normal compressibility,
respiratory phasicity and response to augmentation.

Calf Veins: No evidence of thrombus. Normal compressibility and flow
on color Doppler imaging.

Superficial Great Saphenous Vein: No evidence of thrombus. Normal
compressibility.

Venous Reflux:  None.

Other Findings:  None.
IMPRESSION: No evidence of deep venous thrombosis.

## 2019-01-12 NOTE — ED Triage Notes (Signed)
Patient ambulatory to triage with steady gait, without difficulty or distress noted, mask in place; pt reports pain/tingling/swelling to left leg for few hours with no known injury

## 2019-01-13 MED ORDER — IBUPROFEN 800 MG PO TABS
800.0000 mg | ORAL_TABLET | ORAL | Status: AC
  Administered 2019-01-13: 800 mg via ORAL
  Filled 2019-01-13: qty 1

## 2019-01-13 MED ORDER — CYCLOBENZAPRINE HCL 10 MG PO TABS
10.0000 mg | ORAL_TABLET | Freq: Once | ORAL | Status: AC
  Administered 2019-01-13: 10 mg via ORAL
  Filled 2019-01-13: qty 1

## 2019-01-13 MED ORDER — CYCLOBENZAPRINE HCL 10 MG PO TABS: 10.0000 mg | ORAL_TABLET | Freq: Three times a day (TID) | ORAL | 0 refills | Status: DC | PRN

## 2019-01-13 NOTE — Discharge Instructions (Signed)
No driving tonight or while taking cyclobenzaprine, it can make you drowsy.

## 2019-01-13 NOTE — ED Provider Notes (Signed)
[facility] Emergency Department Provider Note   ____________________________________________   First MD Initiated Contact with Patient 01/12/19 2351     (approximate)  I have reviewed the triage vital signs and the nursing notes.   HISTORY  Chief Complaint Leg Swelling    HPI Morgan Stevens is a 53 y.o. female here for evaluation of pain in the left thigh  At home patient was walking in her kitchen when she started having pain in the middle of her left thigh, so sharp feeling worse with movement and bending.  No fall or injury.  It also has caused a radiating tingling feeling into the top of the foot in the middle of her lower left leg.  It comes and goes.  Worse with movement.  No fevers or chills.  No chest pain or trouble breathing.  No numbness or weakness, but feels a "pins-and-needles" feeling short across the front of the left leg.  No back pain.   Past Medical History:  Diagnosis Date  . Gastritis   . Hiatal hernia   . Obesity (BMI 30-39.9)   . Reflux   . Thyroid disease     Patient Active Problem List   Diagnosis Date Noted  . Pain in the chest   . Abnormal cardiac function test 02/26/2016  . Obesity (BMI 30-39.9)   . Angina pectoris (HCC) 02/25/2016  . Dyspnea 02/25/2016  . Hypothyroidism 02/25/2016  . Chest pain 09/07/2015    Past Surgical History:  Procedure Laterality Date  . CARDIAC CATHETERIZATION  02/27/2016   Procedure: Left Heart Cath and Coronary Angiography;  Surgeon: Corky Crafts, MD;  Location: [facility] INVASIVE CV LAB;  Service: Cardiovascular;;  . CESAREAN SECTION    . CHOLECYSTECTOMY    . THYROIDECTOMY    . THYROIDECTOMY      Prior to Admission medications   Medication Sig Start Date End Date Taking? Authorizing Provider  amoxicillin (AMOXIL) 500 MG capsule Take 1 capsule (500 mg total) by mouth 3 (three) times daily. Patient not taking: Reported on 02/23/2017 12/26/16   Joni Reining, PA-C  cyclobenzaprine  (FLEXERIL) 10 MG tablet Take 1 tablet (10 mg total) by mouth 3 (three) times daily as needed for muscle spasms. 01/13/19   Sharyn Creamer, MD  gabapentin (NEURONTIN) 300 MG capsule Take 1 capsule (300 mg total) by mouth 3 (three) times daily. 03/16/17   Loleta Rose, MD  HYDROcodone-acetaminophen (NORCO) 5-325 MG tablet Take 1 tablet by mouth every 4 (four) hours as needed for moderate pain. 05/05/18   Willy Eddy, MD  ibuprofen (ADVIL,MOTRIN) 600 MG tablet Take 1 tablet (600 mg total) by mouth every 8 (eight) hours as needed. Patient not taking: Reported on 02/23/2017 12/26/16   Joni Reining, PA-C  levothyroxine (SYNTHROID, LEVOTHROID) 137 MCG tablet Take 137 mcg by mouth daily before breakfast.    [provider]  lidocaine (XYLOCAINE) 2 % solution Use as directed 5 mLs in the mouth or throat every 6 (six) hours as needed for mouth pain. Patient not taking: Reported on 02/23/2017 12/26/16   Joni Reining, PA-C  meclizine (ANTIVERT) 25 MG tablet Take 1 tablet (25 mg total) by mouth 3 (three) times daily as needed for dizziness. 06/16/17   Dionne Bucy, MD  meloxicam (MOBIC) 15 MG tablet Take 1 tablet (15 mg total) by mouth daily. 07/11/18   Triplett, Rulon Eisenmenger B, FNP  nabumetone (RELAFEN) 750 MG tablet Take 1 tablet (750 mg total) by mouth 2 (two) times daily.  05/18/17   Menshew, Charlesetta IvoryJenise V Bacon, PA-C  omeprazole (PRILOSEC) 20 MG capsule Take 20 mg by mouth 2 (two) times daily before a meal.     [provider]  omeprazole (PRILOSEC) 40 MG capsule Take 1 capsule (40 mg total) by mouth daily for 30 days. 11/01/18 12/01/18  Darci CurrentBrown, Kingman N, MD  ranitidine (ZANTAC) 150 MG tablet Take 1 tablet (150 mg total) by mouth 2 (two) times daily. 07/28/16 07/28/17  Willy Eddyobinson, Patrick, MD  traMADol (ULTRAM) 50 MG tablet Take 1 tablet (50 mg total) by mouth every 6 (six) hours as needed. 07/11/18   Chinita Pesterriplett, Cari B, FNP    Allergies Patient has no known allergies.  Family History  Problem Relation  Age of Onset  . Breast cancer Neg Hx     Social History Social History   Tobacco Use  . Smoking status: Never Smoker  . Smokeless tobacco: Never Used  Substance Use Topics  . Alcohol use: No    Alcohol/week: 0.0 standard drinks  . Drug use: No    Review of Systems Constitutional: No fever/chills or recent illness Cardiovascular: Denies chest pain. Respiratory: Denies shortness of breath. Gastrointestinal: No abdominal pain.   Genitourinary: Negative for dysuria. Musculoskeletal: Negative for back pain.  See HPI. Skin: Negative for rash. Neurological: Negative for headaches, areas of focal weakness or numbness except "pins-and-needles" over the lower left leg.  No trouble speaking.  No weakness in the muscles anywhere.    ____________________________________________   PHYSICAL EXAM:  VITAL SIGNS: ED Triage Vitals  Enc Vitals Group     BP 01/12/19 2246 (!) 155/76     Pulse Rate 01/12/19 2246 81     Resp 01/12/19 2246 18     Temp 01/12/19 2246 98.6 F (37 C)     Temp Source 01/12/19 2246 Oral     SpO2 01/12/19 2246 98 %     Weight 01/12/19 2241 220 lb (99.8 kg)     Height 01/12/19 2241 5\' 4"  (1.626 m)     Head Circumference --      Peak Flow --      Pain Score 01/12/19 2241 6     Pain Loc --      Pain Edu? --      Excl. in GC? --     Constitutional: Alert and oriented. Well appearing and in no acute distress.  He is very pleasant. Eyes: Conjunctivae are normal. Head: Atraumatic. Neck: No stridor.  Cardiovascular: Normal rate, regular rhythm. Grossly normal heart sounds.  Good peripheral circulation. Respiratory: Normal respiratory effort.  No retractions. Lungs CTAB. Gastrointestinal: Soft and nontender. No distention. Musculoskeletal:   Lower Extremities  No edema. Normal DP/PT pulses bilateral with good cap refill.  Normal neuro-motor function lower extremities bilateral she does describe a slight "pins-and-needles" feeling from the left knee of the  anterior shin to about the level of the ankle.  He has normal sensation across the left foot.  Normal use of the toes the left foot.  RIGHT Right lower extremity demonstrates normal strength, good use of all muscles. No edema bruising or contusions of the right hip, right knee, right ankle. Full range of motion of the right lower extremity without pain. No pain on axial loading. No evidence of trauma.  LEFT Left lower extremity demonstrates normal strength, good use of all muscles she does report some discomfort when moving through range of motion over the anterior quadriceps region but there is no mass, no abnormal skin finding,  no obvious focal tenderness other than in the mid quadriceps region she does report pain especially with movement. No edema bruising or contusions of the hip,  knee, ankle. Full range of motion of the left lower extremity without pain. No pain on axial loading. No evidence of trauma.  No lumbar or thoracic tenderness.  No tenderness or pain at the sciatic notch on the left.  Neurologic:  Normal speech and language. No gross focal neurologic deficits are appreciated.  Skin:  Skin is warm, dry and intact. No rash noted. Psychiatric: Mood and affect are normal. Speech and behavior are normal.  ____________________________________________   LABS (all labs ordered are listed, but only abnormal results are displayed)  Labs Reviewed - No data to display ____________________________________________  EKG   ____________________________________________  RADIOLOGY  Left lower extremity ultrasound negative for DVT ____________________________________________   PROCEDURES  Procedure(s) performed: None  Procedures  Critical Care performed: No  ____________________________________________   INITIAL IMPRESSION / ASSESSMENT AND PLAN / ED COURSE  Pertinent labs & imaging results that were available during my care of the patient were reviewed by me and considered  in my medical decision making (see chart for details).   Suspect likely musculoskeletal nature.  No associated neurologic symptoms of suggest a central cause or spinal cause.  There is no signs of muscle rupture or quadriceps rupture.  She is able to range the leg including knee and hip through good range of motion.  No trauma.  Clinical examination and history I suspect likely slight quadriceps strain or other nerve impingement likely occurred while she was walking today.  Discussed with the patient will treat with muscle relaxant and conservative treatment including ibuprofen.  Patient in agreement.  She is not driving, she reports she has taken Flexeril in the past with good effect, and understands it may make her drowsy.  She agrees not to drive while taking it.  Return precautions and treatment recommendations and follow-up discussed with the patient who is agreeable with the plan.       ____________________________________________   FINAL CLINICAL IMPRESSION(S) / ED DIAGNOSES  Final diagnoses:  Pain of left thigh        Note:  This document was prepared using Dragon voice recognition software and may include unintentional dictation errors       Sharyn Creamer, MD 01/13/19 0010

## 2019-02-10 DIAGNOSIS — M5432 Sciatica, left side: Secondary | ICD-10-CM | POA: Diagnosis not present

## 2019-02-13 ENCOUNTER — Emergency Department (HOSPITAL_COMMUNITY): Payer: Medicaid Other

## 2019-02-13 ENCOUNTER — Other Ambulatory Visit: Payer: Self-pay

## 2019-02-13 ENCOUNTER — Emergency Department (HOSPITAL_COMMUNITY)
Admission: EM | Admit: 2019-02-13 | Discharge: 2019-02-13 | Disposition: A | Discharge status: Home / Self Care | Payer: Medicaid Other | Attending: Emergency Medicine | Admitting: Emergency Medicine

## 2019-02-13 ENCOUNTER — Encounter (HOSPITAL_COMMUNITY): Payer: Self-pay

## 2019-02-13 DIAGNOSIS — I10 Essential (primary) hypertension: Secondary | ICD-10-CM | POA: Diagnosis not present

## 2019-02-13 DIAGNOSIS — I959 Hypotension, unspecified: Secondary | ICD-10-CM | POA: Diagnosis not present

## 2019-02-13 DIAGNOSIS — R51 Headache: Secondary | ICD-10-CM | POA: Diagnosis not present

## 2019-02-13 DIAGNOSIS — R079 Chest pain, unspecified: Secondary | ICD-10-CM | POA: Diagnosis not present

## 2019-02-13 DIAGNOSIS — R0789 Other chest pain: Secondary | ICD-10-CM | POA: Insufficient documentation

## 2019-02-13 DIAGNOSIS — R0902 Hypoxemia: Secondary | ICD-10-CM | POA: Diagnosis not present

## 2019-02-13 LAB — CBC
HCT: 34.9 % — ABNORMAL LOW (ref 36.0–46.0)
Hemoglobin: 12.1 g/dL (ref 12.0–15.0)
MCH: 29.7 pg (ref 26.0–34.0)
MCHC: 34.7 g/dL (ref 30.0–36.0)
MCV: 85.7 fL (ref 80.0–100.0)
Platelets: 302 10*3/uL (ref 150–400)
RBC: 4.07 MIL/uL (ref 3.87–5.11)
RDW: 13.6 % (ref 11.5–15.5)
WBC: 6.5 10*3/uL (ref 4.0–10.5)
nRBC: 0 % (ref 0.0–0.2)

## 2019-02-13 LAB — BASIC METABOLIC PANEL
Anion gap: 10 (ref 5–15)
BUN: 8 mg/dL (ref 6–20)
CO2: 20 mmol/L — ABNORMAL LOW (ref 22–32)
Calcium: 8.8 mg/dL — ABNORMAL LOW (ref 8.9–10.3)
Chloride: 107 mmol/L (ref 98–111)
Creatinine, Ser: 0.82 mg/dL (ref 0.44–1.00)
GFR calc Af Amer: >60 mL/min (ref >60)
GFR calc non Af Amer: >60 mL/min (ref >60)
Glucose, Bld: 111 mg/dL — ABNORMAL HIGH (ref 70–99)
Potassium: 3.4 mmol/L — ABNORMAL LOW (ref 3.5–5.1)
Sodium: 137 mmol/L (ref 135–145)

## 2019-02-13 LAB — TROPONIN I
Troponin I: <0.03 ng/mL (ref <0.03)
Troponin I: <0.03 ng/mL (ref <0.03)

## 2019-02-13 LAB — HEPATIC FUNCTION PANEL
ALT: 26 U/L (ref 0–44)
AST: 20 U/L (ref 15–41)
Albumin: 3.6 g/dL (ref 3.5–5.0)
Alkaline Phosphatase: 29 U/L — ABNORMAL LOW (ref 38–126)
Bilirubin, Direct: <0.1 mg/dL (ref 0.0–0.2)
Total Bilirubin: 0.7 mg/dL (ref 0.3–1.2)
Total Protein: 6.4 g/dL — ABNORMAL LOW (ref 6.5–8.1)

## 2019-02-13 LAB — LIPASE, BLOOD: Lipase: 26 U/L (ref 11–51)

## 2019-02-13 IMAGING — CT CT ANGIOGRAPHY CHEST
1 of 6 series · 4 of 16 positions shown · IV contrast (omnipaque)
Comparison: CT chest [DATE]. Single-view of the chest earlier
today.

CLINICAL DATA: Onset chest pain radiating to the left arm and neck
1.5 hours ago.

EXAM:
CT ANGIOGRAPHY CHEST WITH CONTRAST
TECHNIQUE: Multidetector CT imaging of the chest was performed using the
standard protocol during bolus administration of intravenous
contrast. Multiplanar CT image reconstructions and MIPs were
obtained to evaluate the vascular anatomy.
CONTRAST:  100 mL OMNIPAQUE IOHEXOL 350 MG/ML SOLN

[Series 7: pe thins · axial · 0.77mm/px · z∈[+1313,+1482]mm · 4 of 403 slices shown]
[im 81/403  lung]
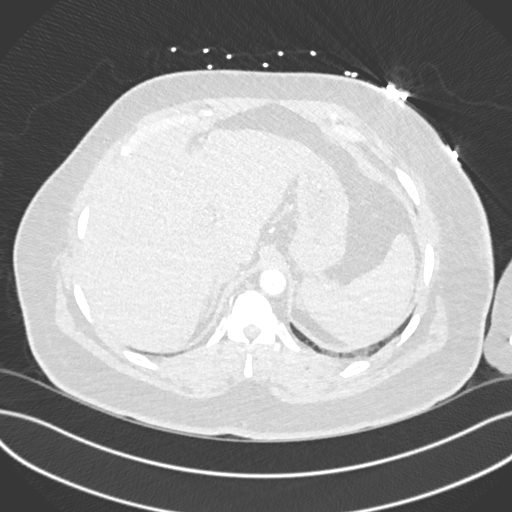
[im 161/403  soft-tissue]
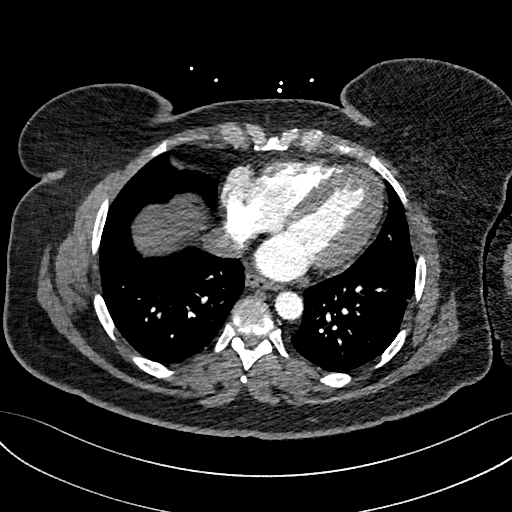
[im 242/403  lung]
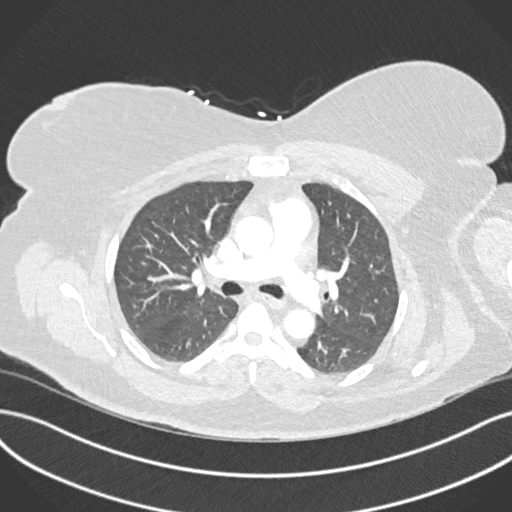
[im 322/403  soft-tissue]
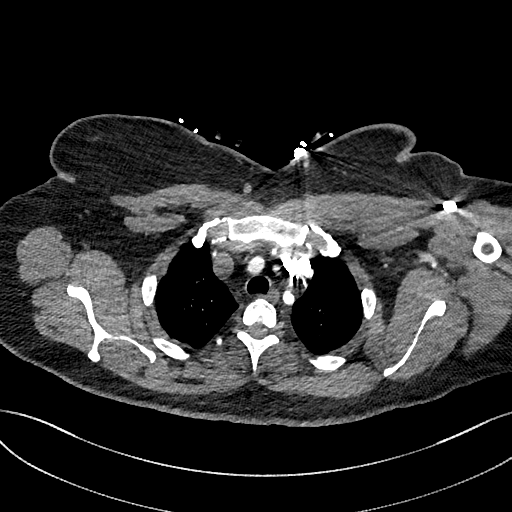

[4 of 16 positions shown; findings below may reference images not displayed]

FINDINGS: Cardiovascular: Satisfactory opacification of the pulmonary arteries
to the segmental level. No evidence of pulmonary embolism. Normal
heart size. No pericardial effusion.

Mediastinum/Nodes: No enlarged mediastinal, hilar, or axillary lymph
nodes. Thyroid gland, trachea, and esophagus demonstrate no
significant findings.

Lungs/Pleura: Lungs are clear. No pleural effusion or pneumothorax.

Upper Abdomen: Negative.

Musculoskeletal: Negative.

Review of the MIP images confirms the above findings.
IMPRESSION: Negative pulmonary embolus.  Negative chest CT.

## 2019-02-13 IMAGING — DX PORTABLE CHEST - 1 VIEW
1 series · 1 of 1 positions shown · non-contrast
Comparison: Left-sided chest pain.

CLINICAL DATA: Left-sided chest and arm pain.

EXAM:
PORTABLE CHEST 1 VIEW

[chest ap]
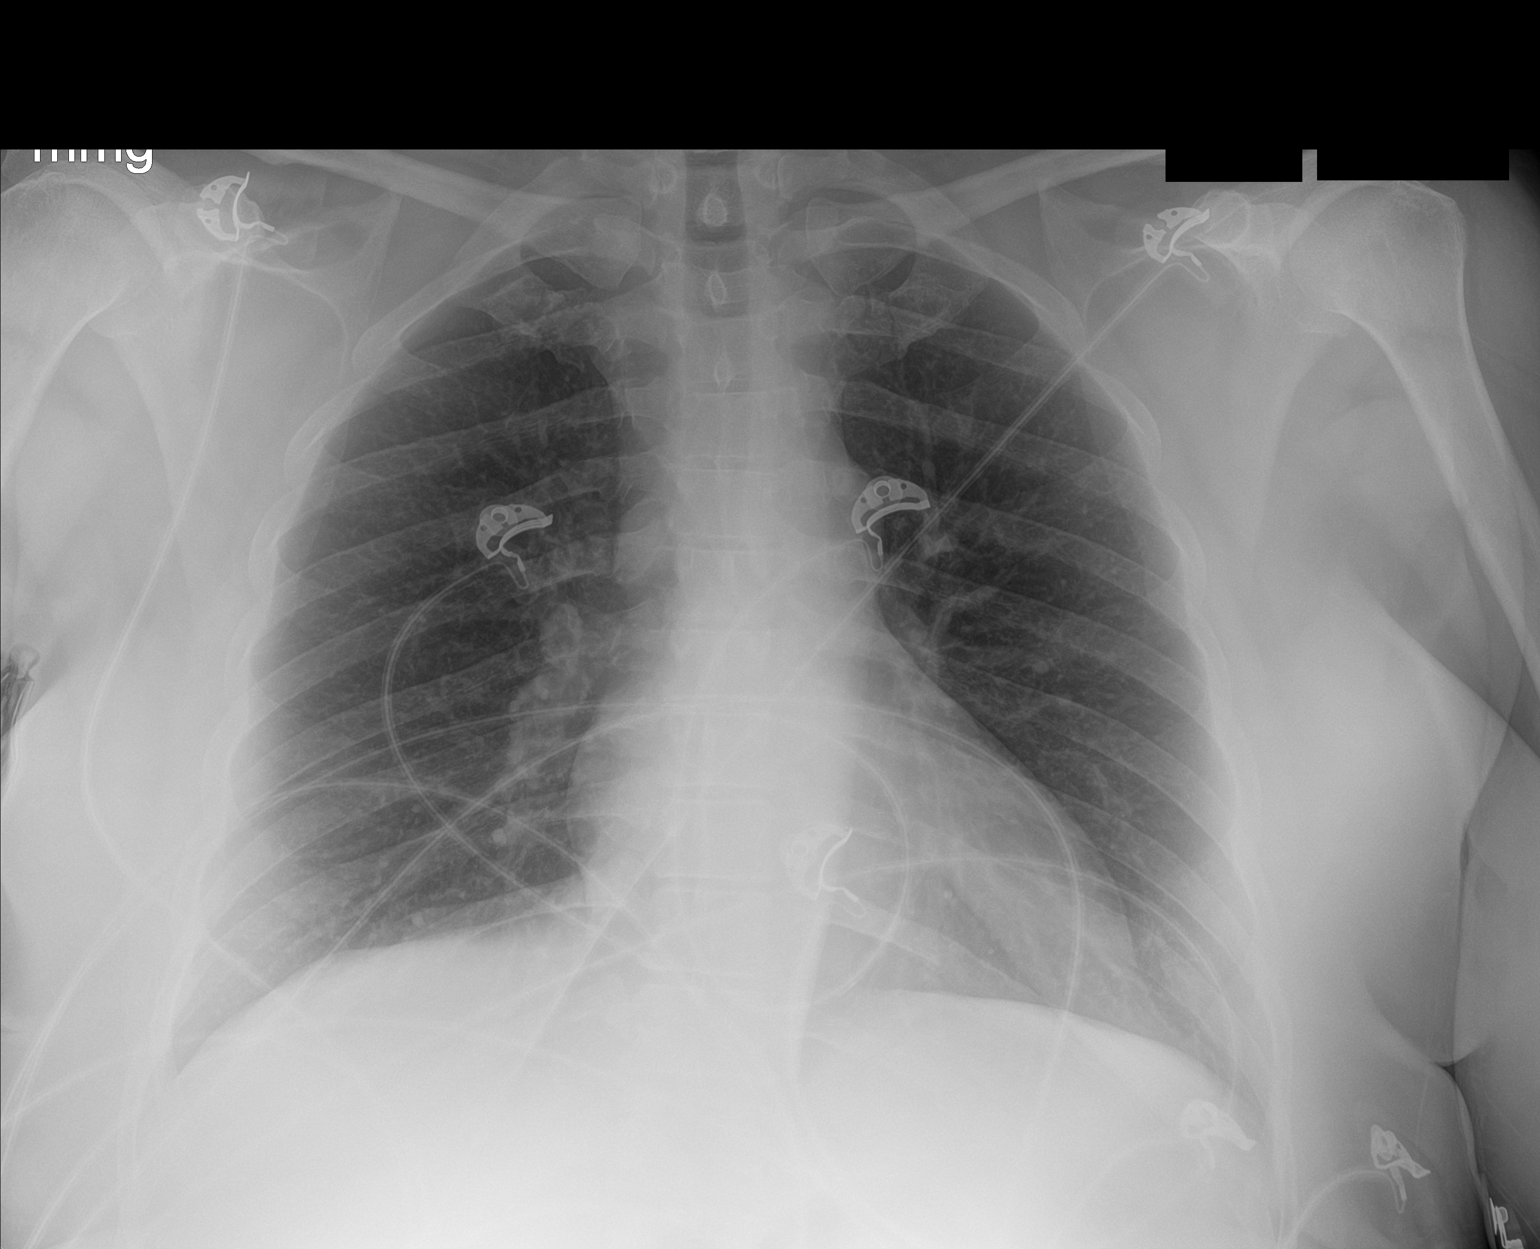

[1 of 1 positions shown; findings below may reference images not displayed]

FINDINGS: The heart size and mediastinal contours are within normal limits.
Both lungs are clear. The visualized skeletal structures are
unremarkable.
IMPRESSION: No active disease.

## 2019-02-13 MED ORDER — NITROGLYCERIN 0.4 MG SL SUBL
0.4000 mg | SUBLINGUAL_TABLET | SUBLINGUAL | Status: DC | PRN
  Administered 2019-02-13: 0.4 mg via SUBLINGUAL
  Filled 2019-02-13: qty 1

## 2019-02-13 MED ORDER — IOHEXOL 350 MG/ML SOLN
100.0000 mL | Freq: Once | INTRAVENOUS | Status: AC | PRN
  Administered 2019-02-13: 100 mL via INTRAVENOUS

## 2019-02-13 NOTE — Discharge Instructions (Addendum)
Increase omeprazole to twice a day for 1 week. 

## 2019-02-13 NOTE — ED Triage Notes (Signed)
Pt c/o CP x 1.5 hours while driving; pt pulled to side of road, called ems; heavy, squeezing, central, radiation to L arm and neck; 8/10 initially; 6/10 with 324 asa and 1 nitro; hx hypothyroidism; no N/V/D; lung sounds clear with ems; some abd tenderness RUQ, ,RLQ  P 98 RR 20 T 98.1 96% RA

## 2019-02-13 NOTE — ED Notes (Signed)
Patient transported to CT 

## 2019-02-13 NOTE — ED Provider Notes (Signed)
[facility] Provider Note   CSN: [id number] Arrival date & time: 02/13/19  1536    History   Chief Complaint Chief Complaint  Patient presents with  . Chest Pain    HPI Huel CoventryRuth Larner is a 53 y.o. female.     Pt presents to the ED today with cp.  She said it started about 1.5 hrs ago while driving.  She describes it as a heavy, squeezing pain.  She has never had anything like this in the past.  Last cath was in 02/2016.   The left ventricular systolic function is normal.  No angiographically apparent coronary artery disease.  Normal LVEDP.   False positive nuclear study.    Continue aggressive preventive care.  She denies any f/c, cough, or sob.  She denies any known covid exposures.     Past Medical History:  Diagnosis Date  . Gastritis   . Hiatal hernia   . Obesity (BMI 30-39.9)   . Reflux   . Thyroid disease     Patient Active Problem List   Diagnosis Date Noted  . Pain in the chest   . Abnormal cardiac function test 02/26/2016  . Obesity (BMI 30-39.9)   . Angina pectoris (HCC) 02/25/2016  . Dyspnea 02/25/2016  . Hypothyroidism 02/25/2016  . Chest pain 09/07/2015    Past Surgical History:  Procedure Laterality Date  . CARDIAC CATHETERIZATION  02/27/2016   Procedure: Left Heart Cath and Coronary Angiography;  Surgeon: Corky CraftsJayadeep S Varanasi, MD;  [facility];  Service: Cardiovascular;;  . CESAREAN SECTION    . CHOLECYSTECTOMY    . THYROIDECTOMY    . THYROIDECTOMY       OB History   No obstetric history on file.      Home Medications    Prior to Admission medications   Medication Sig Start Date End Date Taking? Authorizing Provider  cyclobenzaprine (FLEXERIL) 10 MG tablet Take 1 tablet (10 mg total) by mouth 3 (three) times daily as needed for muscle spasms. 01/13/19  Yes Sharyn CreamerQuale, Mark, MD  levothyroxine (SYNTHROID, LEVOTHROID) 137 MCG tablet Take 137 mcg by mouth daily before breakfast.    Yes [provider]  omeprazole (PRILOSEC) 20 MG capsule Take 20 mg by mouth 2 (two) times daily before a meal.    Yes [provider]  gabapentin (NEURONTIN) 300 MG capsule Take 1 capsule (300 mg total) by mouth 3 (three) times daily. Patient not taking: Reported on 02/13/2019 03/16/17   Loleta RoseForbach, Cory, MD  omeprazole (PRILOSEC) 40 MG capsule Take 1 capsule (40 mg total) by mouth daily for 30 days. Patient not taking: Reported on 02/13/2019 11/01/18 02/13/19  Darci CurrentBrown, Poydras N, MD  ranitidine (ZANTAC) 150 MG tablet Take 1 tablet (150 mg total) by mouth 2 (two) times daily. Patient not taking: Reported on 02/13/2019 07/28/16 02/13/19  Willy Eddyobinson, Patrick, MD    Family History Family History  Problem Relation Age of Onset  . Breast cancer Neg Hx     Social History Social History   Tobacco Use  . Smoking status: Never Smoker  . Smokeless tobacco: Never Used  Substance Use Topics  . Alcohol use: No    Alcohol/week: 0.0 standard drinks  . Drug use: No     Allergies   Patient has no known allergies.   Review of Systems Review of Systems  Cardiovascular: Positive for chest pain.  All other systems reviewed and are negative.    Physical Exam Updated Vital  Signs BP 124/75   Pulse 80   Temp 99 F (37.2 C) (Oral)   Resp 16   Ht 5\' 4"  (1.626 m)   Wt 99.8 kg   LMP 01/30/2019 (Approximate)   SpO2 98%   BMI 37.76 kg/m   Physical Exam Vitals signs and nursing note reviewed.  Constitutional:      Appearance: She is well-developed.  HENT:     Head: Normocephalic and atraumatic.  Eyes:     Extraocular Movements: Extraocular movements intact.     Pupils: Pupils are equal, round, and reactive to light.  Neck:     Musculoskeletal: Normal range of motion and neck supple.  Cardiovascular:     Rate and Rhythm: Normal rate and regular rhythm.     Heart sounds: Normal heart sounds.  Pulmonary:     Effort: Pulmonary effort is normal.     Breath sounds: Normal breath  sounds.  Abdominal:     General: Bowel sounds are normal.     Palpations: Abdomen is soft.  Musculoskeletal: Normal range of motion.  Skin:    General: Skin is warm and dry.     Capillary Refill: Capillary refill takes less than 2 seconds.  Neurological:     General: No focal deficit present.     Mental Status: She is alert and oriented to person, place, and time.  Psychiatric:        Mood and Affect: Mood normal.        Behavior: Behavior normal.      ED Treatments / Results  Labs (all labs ordered are listed, but only abnormal results are displayed) Labs Reviewed  BASIC METABOLIC PANEL - Abnormal; Notable for the following components:      Result Value   Potassium 3.4 (*)    CO2 20 (*)    Glucose, Bld 111 (*)    Calcium 8.8 (*)    All other components within normal limits  CBC - Abnormal; Notable for the following components:   HCT 34.9 (*)    All other components within normal limits  HEPATIC FUNCTION PANEL - Abnormal; Notable for the following components:   Total Protein 6.4 (*)    Alkaline Phosphatase 29 (*)    All other components within normal limits  TROPONIN I  LIPASE, BLOOD  TROPONIN I    EKG EKG Interpretation  Date/Time:  Friday February 13 2019 15:48:08 EDT Ventricular Rate:  81 PR Interval:    QRS Duration: 89 QT Interval:  390 QTC Calculation: 453 R Axis:   49 Text Interpretation:  Sinus rhythm Confirmed by Jacalyn LefevreHaviland, Sherryn Pollino [phone]([id number]) on 02/13/2019 3:55:18 PM   Radiology Ct Angio Chest Pe W And/or Wo Contrast  Result Date: 02/13/2019 CLINICAL DATA:  Onset chest pain radiating to the left arm and neck 1.5 hours ago. EXAM: CT ANGIOGRAPHY CHEST WITH CONTRAST TECHNIQUE: Multidetector CT imaging of the chest was performed using the standard protocol during bolus administration of intravenous contrast. Multiplanar CT image reconstructions and MIPs were obtained to evaluate the vascular anatomy. CONTRAST:  100 mL OMNIPAQUE IOHEXOL 350 MG/ML SOLN COMPARISON:  CT  chest 11/01/2018. Single-view of the chest earlier today. FINDINGS: Cardiovascular: Satisfactory opacification of the pulmonary arteries to the segmental level. No evidence of pulmonary embolism. Normal heart size. No pericardial effusion. Mediastinum/Nodes: No enlarged mediastinal, hilar, or axillary lymph nodes. Thyroid gland, trachea, and esophagus demonstrate no significant findings. Lungs/Pleura: Lungs are clear. No pleural effusion or pneumothorax. Upper Abdomen: Negative. Musculoskeletal: Negative. Review of the MIP  images confirms the above findings. IMPRESSION: Negative pulmonary embolus.  Negative chest CT. Electronically Signed   By: Inge Rise M.D.   On: 02/13/2019 19:16   Dg Chest Port 1 View  Result Date: 02/13/2019 CLINICAL DATA:  Left-sided chest and arm pain. EXAM: PORTABLE CHEST 1 VIEW COMPARISON:  Left-sided chest pain. FINDINGS: The heart size and mediastinal contours are within normal limits. Both lungs are clear. The visualized skeletal structures are unremarkable. IMPRESSION: No active disease. Electronically Signed   By: Fidela Salisbury M.D.   On: 02/13/2019 16:24    Procedures Procedures (including critical care time)  Medications Ordered in ED Medications  nitroGLYCERIN (NITROSTAT) SL tablet 0.4 mg (0.4 mg Sublingual Given 02/13/19 1615)  iohexol (OMNIPAQUE) 350 MG/ML injection 100 mL (100 mLs Intravenous Contrast Given 02/13/19 1840)     Initial Impression / Assessment and Plan / ED Course  I have reviewed the triage vital signs and the nursing notes.  Pertinent labs & imaging results that were available during my care of the patient were reviewed by me and considered in my medical decision making (see chart for details).     Pt has had 2 sets of negative troponins.  Pt's CT chest negative.  Pt had a nl cath in 2017.  Pt is stable for d/c.  She is instructed to f/u with pcp/cards.  Final Clinical Impressions(s) / ED Diagnoses   Final diagnoses:   Atypical chest pain    ED Discharge Orders    None       Isla Pence, MD 02/13/19 2145

## 2019-02-13 NOTE — ED Notes (Signed)
Please call Con Memos, husband, with any updates.  [phone].

## 2019-02-20 ENCOUNTER — Emergency Department
Admission: EM | Admit: 2019-02-20 | Discharge: 2019-02-20 | Discharge status: Against Medical Advice | Payer: Medicaid Other

## 2019-03-04 ENCOUNTER — Ambulatory Visit: Payer: Medicaid Other

## 2019-03-10 ENCOUNTER — Ambulatory Visit: Payer: Medicaid Other | Attending: Family Medicine

## 2019-03-10 ENCOUNTER — Other Ambulatory Visit: Payer: Self-pay

## 2019-03-10 DIAGNOSIS — M5412 Radiculopathy, cervical region: Secondary | ICD-10-CM | POA: Diagnosis not present

## 2019-03-10 DIAGNOSIS — M5440 Lumbago with sciatica, unspecified side: Secondary | ICD-10-CM | POA: Insufficient documentation

## 2019-03-11 ENCOUNTER — Ambulatory Visit: Payer: Medicaid Other

## 2019-03-11 NOTE — Addendum Note (Signed)
Addended by: Blain Pais on: 03/11/2019 03:01 PM   Modules accepted: Orders

## 2019-03-11 NOTE — Therapy (Signed)
[facility] PHYSICAL AND SPORTS MEDICINE [address] [place], [place], [address] Phone: [phone]   Fax:  [phone]  Physical Therapy Evaluation  Patient Details  Name: Morgan Stevens MRN: [id number] Date of Birth: [birth year] Referring Provider (PT): Christiansen, [place]   Encounter Date: 03/10/2019  PT End of Session - 03/10/19 1653    Visit Number  1    Number of Visits  13    Date for PT Re-Evaluation  04/21/19    Authorization Type  [employer]    PT Start Time  1530    PT Stop Time  1630    PT Time Calculation (min)  60 min    Activity Tolerance  Patient tolerated treatment well    Behavior During Therapy  [facility] for tasks assessed/performed       Past Medical History:  Diagnosis Date  . Gastritis   . Hiatal hernia   . Obesity (BMI 30-39.9)   . Reflux   . Thyroid disease     Past Surgical History:  Procedure Laterality Date  . CARDIAC CATHETERIZATION  02/27/2016   Procedure: Left Heart Cath and Coronary Angiography;  Surgeon: Corky CraftsJayadeep S Varanasi, MD;  [facility];  Service: Cardiovascular;;  . CESAREAN SECTION    . CHOLECYSTECTOMY    . THYROIDECTOMY    . THYROIDECTOMY      There were no vitals filed for this visit.   Subjective Assessment - 03/10/19 1542    Subjective  Patient reports increased L sided neck, UE, low back and  LE pain with radiating symptoms on both the UE and LE. Patient states she has been experiencing pain for the past year from insidious onset. Patient states overall worsening of pain throughout the day. Patient states she has worse pain with general use of the L UE increasing symptoms radiating down into the finger on the affected L UE. Patient states pain is worse at the end of the day, most notably after performing frequent activity on the affected side. Patient states prolonged sitting and walking also aggravates her low back pain with radiaiting symptoms down the affected side. 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 Patient states she has increased pain along her UT on the affected side with performing shoulder flexion.    Pertinent History  Thyroidectomy, Hiatial hernia, Chronic LBP/neck pain    Limitations  Sitting;Lifting;Standing;Walking    How long can you sit comfortably?  1 hour    How long can you stand comfortably?  1 hour    Patient Stated Goals  To decrease pain    Currently in Pain?  Yes    Pain Score  4    worst: 8/10; best: 4/10   Pain Location  Arm    Pain Orientation  Left    Pain Descriptors / Indicators  Aching;Radiating    Pain Type  Chronic pain    Pain Radiating Towards  fingers on the affected side    Pain Onset  More than a month ago    Pain Frequency  Intermittent    Multiple Pain Sites  Yes    Pain Score  4    Pain Location  Back   worst: 7/10; best 0/10   Pain Orientation  Left    Pain Descriptors / Indicators  Aching;Radiating    Pain Type  Chronic pain    Pain Radiating Towards  L  foot    Pain Onset  More than a month ago    Pain Frequency  Intermittent         [facility] PT Assessment - 03/10/19 1538      Assessment   Medical Diagnosis  L LBP; UE     Referring Provider (PT)  Christiansen, [place]    Onset Date/Surgical Date  02/25/19    Hand Dominance  Right    Next MD Visit  unknown    Prior Therapy  no      Balance Screen   Has the patient fallen in the past 6 months  No    Has the patient had a decrease in activity level because of a fear of falling?   Yes    Is the patient reluctant to leave their home because of a fear of falling?   No      Home Film/video editor residence    Living Arrangements  Spouse/significant other    Available Help at Discharge  Family    Type of [place] to enter      Prior Function   Level of Independence  Independent    Vocation  Unemployed    Vocation  Requirements  N/A    Leisure  camping, hikes, outdoors      Cognition   Overall Cognitive Status  Within Functional Limits for tasks assessed      Observation/Other Assessments   Observations  Motor control: poor scapular retraction, cervical retraction      Sensation   Light Touch  Appears Intact      Posture/Postural Control   Posture Comments  FHP, maintained lumbar flexion in standing, forward rounded shoulder      ROM / Strength   AROM / PROM / Strength  AROM;Strength      AROM   AROM Assessment Site  Hip;Knee;Lumbar;Cervical    Right/Left Hip  Right;Left    Right Hip Flexion  100    Right Hip External Rotation   30    Left Hip Flexion  95    Left Hip External Rotation   25    Right/Left Knee  Left;Right    Right Knee Extension  0    Right Knee Flexion  120    Left Knee Extension  0   increased pain at end range   Left Knee Flexion  120    Cervical Flexion  WNL    Cervical Extension  50% limited - pain    Cervical - Right Side Bend  WNL    Cervical - Left Side Bend  25% limited - pain    Cervical - Right Rotation  WNL    Cervical - Left Rotation  25% limited - pain    Lumbar Flexion  WNL    Lumbar Extension  66% limited - pain    Lumbar - Right Side Bend  WNL    Lumbar - Left Side Bend  25% limited - pain    Lumbar - Right Rotation  WNL    Lumbar - Left Rotation  25% limited - pain      Strength   Strength Assessment Site  Hip;Knee;Lumbar;Cervical    Right/Left Hip  Right;Left    Right Hip Flexion  4+/5    Right Hip External Rotation   4/5    Right Hip ABduction  4/5    Left Hip Flexion  4-/5    Left  Hip External Rotation  4-/5    Left Hip ABduction  4-/5    Right/Left Knee  Left;Right    Right Knee Flexion  5/5    Right Knee Extension  5/5    Left Knee Flexion  4-/5    Left Knee Extension  4-/5    Cervical Flexion  4/5    Cervical Extension  4-/5    Cervical - Right Side Bend  4+/5    Cervical - Left Side Bend  4-/5    Cervical - Right Rotation   4+/5    Cervical - Left Rotation  4-/5    Lumbar Flexion  4/5    Lumbar Extension  4+/5      Palpation   Spinal mobility  C4-T4 hypomobility and increased pain central PA    Palpation comment  TTP: scapular retractors and cervical retractors      Special Tests    Special Tests  Cervical;Lumbar    Cervical Tests  Spurling's    Lumbar Tests  Slump Test      Spurling's   Findings  Positive    Side  Left    Comment  pain      Slump test   Findings  Positive    Side  Left    Comment  pain        Objective measurements completed on examination: See above findings.    TREATMENT Therapeutic Exercise: cervical retraction in sitting -- x 15 Scapular retraction in sitting -- x 15 Lumbar extension in standing -- x 15  Performed exercises to improve ability to perform motor control and decrease pain         PT Education - 03/10/19 1652    Education Details  Form/technique with exercise; POC; HEP: cervical retraction, scapular retraction    Person(s) Educated  Patient    Methods  Explanation;Demonstration    Comprehension  Verbalized understanding;Returned demonstration          PT Long Term Goals - 03/10/19 1659      PT LONG TERM GOAL #1   Title  Patient will be independent with HEP to continue benefits of therapy after discharge    Baseline  Dependent with form/technique    Time  6    Period  Weeks    Status  New    Target Date  04/07/19      PT LONG TERM GOAL #2   Title  Patient will have a worst pain of 2/10 in the cervical and 2/10 in the lumbar spine to better be able to perform greater amount of standing and use of her UE with less pain.    Baseline  cervical: 8/10; lumbar 7/10 worst pain    Time  6    Period  Weeks    Status  New    Target Date  04/07/19      PT LONG TERM GOAL #3   Title  Patient will be able to drive in the car or sit for > 2 hours without increase in pain to better be able to travel with less.    Baseline  drive 1 hour with less  pain    Time  6    Period  Weeks    Status  New    Target Date  04/21/19      PT LONG TERM GOAL #4   Title  Patient will have no radiating symptoms down her UE or LE B to better be able to perform  exercises in standing.    Baseline  Radiating pain down LE and UE B    Time  6    Period  Weeks    Status  New    Target Date  03/22/19             Plan - 03/10/19 1653    Clinical Impression Statement  Patient is a 53 yo right hand dominant female presenting with increased pain along the L UE and L LE secondary to lumbar/cervical facet irration with possible peripheral nerve involvement. Patient demosntrates cervical and lumbar dysfunction as indicated by a positive SLUMP test and positive cervical compression test on the L side. Patient demosntrates decrease in muscular strength, AROM  and poor motor control wihin her L sided cervical and lumbar musculature. Patient will benefit from further skilled therapy focused on improving these limitations to return to prior level of function.    Personal Factors and Comorbidities  Age;Behavior Pattern;Comorbidity 2;Comorbidity 3+    Comorbidities  thyroidectomy, chronic pain, hernia    Examination-Activity Limitations  Bend;Carry;Sit;Lift;Transfers;Squat    Examination-Participation Restrictions  Community Activity;Driving    Stability/Clinical Decision Making  Evolving/Moderate complexity    Clinical Decision Making  Moderate    Rehab Potential  Fair    PT Frequency  2x / week    PT Duration  6 weeks    PT Treatment/Interventions  Manual techniques;Patient/family education;Electrical Stimulation;Cryotherapy;Aquatic Therapy;Moist Heat;Traction;Stair training;Gait training;Therapeutic activities;Therapeutic exercise;Neuromuscular re-education;Dry needling;Spinal Manipulations;Joint Manipulations    PT Next Visit Plan  Progress AROM    PT Home Exercise Plan  See education section       Patient will benefit from skilled therapeutic intervention  in order to improve the following deficits and impairments:  Decreased activity tolerance, Decreased coordination, Decreased strength, Hypermobility, Pain, Postural dysfunction, Improper body mechanics, Decreased range of motion, Decreased balance, Difficulty walking, Increased muscle spasms  Visit Diagnosis: 1. Low back pain with sciatica, sciatica laterality unspecified, unspecified back pain laterality, unspecified chronicity   2. Radiculopathy, cervical region        Problem List Patient Active Problem List   Diagnosis Date Noted  . Pain in the chest   . Abnormal cardiac function test 02/26/2016  . Obesity (BMI 30-39.9)   . Angina pectoris (HCC) 02/25/2016  . Dyspnea 02/25/2016  . Hypothyroidism 02/25/2016  . Chest pain 09/07/2015    Myrene GalasWesley Calinda Stockinger, PT DPT 03/11/2019, 10:48 AM  [facility] [address] [place], [place], [address] Phone: [phone]   Fax:  [phone]  Name: Morgan Stevens MRN: [id number] Date of Birth: 07-Apr-1966

## 2019-03-16 ENCOUNTER — Ambulatory Visit: Payer: Medicaid Other

## 2019-03-19 ENCOUNTER — Ambulatory Visit: Payer: Medicaid Other

## 2019-03-19 ENCOUNTER — Other Ambulatory Visit: Payer: Self-pay

## 2019-03-19 DIAGNOSIS — M5412 Radiculopathy, cervical region: Secondary | ICD-10-CM | POA: Diagnosis not present

## 2019-03-19 DIAGNOSIS — M5440 Lumbago with sciatica, unspecified side: Secondary | ICD-10-CM | POA: Diagnosis not present

## 2019-03-19 NOTE — Therapy (Signed)
[facility] [address] [place], [place], [address] Phone: [phone]   Fax:  [phone]  Physical Therapy Treatment  Patient Details  Name: Morgan Stevens MRN: [id number] Date of Birth: [birth year] Referring Provider (PT): Christiansen, [place]   Encounter Date: 03/19/2019  PT End of Session - 03/19/19 1653    Visit Number  2    Number of Visits  13    Date for PT Re-Evaluation  04/21/19    Authorization Type  [employer]    PT Start Time  1430    PT Stop Time  1515    PT Time Calculation (min)  45 min    Activity Tolerance  Patient tolerated treatment well    Behavior During Therapy  [facility] for tasks assessed/performed       Past Medical History:  Diagnosis Date  . Gastritis   . Hiatal hernia   . Obesity (BMI 30-39.9)   . Reflux   . Thyroid disease     Past Surgical History:  Procedure Laterality Date  . CARDIAC CATHETERIZATION  02/27/2016   Procedure: Left Heart Cath and Coronary Angiography;  Surgeon: Corky CraftsJayadeep S Varanasi, MD;  [facility];  Service: Cardiovascular;;  . CESAREAN SECTION    . CHOLECYSTECTOMY    . THYROIDECTOMY    . THYROIDECTOMY      There were no vitals filed for this visit.  Subjective Assessment - 03/19/19 1650    Subjective  Patient reports she has been performing the exercises and states the pain along the neck has been improving and states she has not been feeling the lumbar-based exercises.    Pertinent History  Thyroidectomy, Hiatial hernia, Chronic LBP/neck pain    Limitations  Sitting;Lifting;Standing;Walking    How long can you sit comfortably?  1 hour    How long can you stand comfortably?  1 hour    Patient Stated Goals  To decrease pain    Currently in Pain?  Yes    Pain Score  2     Pain Location  Arm    Pain Orientation  Left    Pain Descriptors / Indicators  Aching    Pain Type  Chronic pain    Pain Onset  More than a month ago    Pain Frequency   Intermittent    Pain Onset  More than a month ago       TREATMENT Therapeutic Exercise Seated cervical retractions -- 2 x 10 Seated scapular retraction -- 2 x 10 Elevated shoulder shrugs -- x 10 Shoulder rolls forward/backward -- x 15 Shoulder rows in sitting with GTB -- 2 x 12 No money with YTB -- 2 x 12  Manual therapy Assisted scapular depression with patient positioned prone performed 4 x 5 with AAROM performed through later sets STM performed to the upper trap and thoracic extensors with patient positioned in prone to decrease increased pain and spasms PA mobilizations T2-7 to decrease pain and spasms along spinous processes grade I/II for 2 x 30 in each position  Performed exercises to improve motor control and increase strength along periscapular musculature.   PT Education - 03/19/19 1653    Education Details  form/technique: HEP: no money exercise in standing    Person(s) Educated  Patient    Methods  Explanation;Demonstration    Comprehension  Verbalized understanding;Returned demonstration          PT Long Term Goals - 03/10/19 1659  PT LONG TERM GOAL #1   Title  Patient will be independent with HEP to continue benefits of therapy after discharge    Baseline  Dependent with form/technique    Time  6    Period  Weeks    Status  New    Target Date  04/07/19      PT LONG TERM GOAL #2   Title  Patient will have a worst pain of 2/10 in the cervical and 2/10 in the lumbar spine to better be able to perform greater amount of standing and use of her UE with less pain.    Baseline  cervical: 8/10; lumbar 7/10 worst pain    Time  6    Period  Weeks    Status  New    Target Date  04/07/19      PT LONG TERM GOAL #3   Title  Patient will be able to drive in the car or sit for > 2 hours without increase in pain to better be able to travel with less.    Baseline  drive 1 hour with less pain    Time  6    Period  Weeks    Status  New    Target Date  04/21/19       PT LONG TERM GOAL #4   Title  Patient will have no radiating symptoms down her UE or LE B to better be able to perform exercises in standing.    Baseline  Radiating pain down LE and UE B    Time  6    Period  Weeks    Status  New    Target Date  03/22/19            Plan - 03/19/19 1654    Clinical Impression Statement  Patient demonstrates improvement with pain and spasms today versus previous session indicating functional carryover between sessions. Patient demonstrates no increase in pain throughout the entirety of the session indicating functional carryover between sessions. Patient will benefit from further skilled therapy to return to prior level of function.    Personal Factors and Comorbidities  Age;Behavior Pattern;Comorbidity 2;Comorbidity 3+    Comorbidities  thyroidectomy, chronic pain, hernia    Examination-Activity Limitations  Bend;Carry;Sit;Lift;Transfers;Squat    Examination-Participation Restrictions  Community Activity;Driving    Stability/Clinical Decision Making  Evolving/Moderate complexity    Rehab Potential  Fair    PT Frequency  2x / week    PT Duration  6 weeks    PT Treatment/Interventions  Manual techniques;Patient/family education;Electrical Stimulation;Cryotherapy;Aquatic Therapy;Moist Heat;Traction;Stair training;Gait training;Therapeutic activities;Therapeutic exercise;Neuromuscular re-education;Dry needling;Spinal Manipulations;Joint Manipulations    PT Next Visit Plan  Progress AROM    PT Home Exercise Plan  See education section       Patient will benefit from skilled therapeutic intervention in order to improve the following deficits and impairments:  Decreased activity tolerance, Decreased coordination, Decreased strength, Hypermobility, Pain, Postural dysfunction, Improper body mechanics, Decreased range of motion, Decreased balance, Difficulty walking, Increased muscle spasms  Visit Diagnosis: 1. Low back pain with sciatica, sciatica  laterality unspecified, unspecified back pain laterality, unspecified chronicity   2. Radiculopathy, cervical region        Problem List Patient Active Problem List   Diagnosis Date Noted  . Pain in the chest   . Abnormal cardiac function test 02/26/2016  . Obesity (BMI 30-39.9)   . Angina pectoris ([place]) 02/25/2016  . Dyspnea 02/25/2016  . Hypothyroidism 02/25/2016  . Chest pain 09/07/2015  Myrene GalasWesley Lonnell Chaput, PT DPT 03/19/2019, 5:04 PM  [facility] [address] [place], [place], [address] Phone: [phone]   Fax:  [phone]  Name: Morgan Stevens MRN: [id number] Date of Birth: 1965/12/01

## 2019-03-24 ENCOUNTER — Ambulatory Visit: Payer: Medicaid Other

## 2019-03-26 ENCOUNTER — Ambulatory Visit: Payer: Medicaid Other

## 2019-03-31 ENCOUNTER — Other Ambulatory Visit: Payer: Self-pay

## 2019-03-31 ENCOUNTER — Ambulatory Visit: Payer: Medicaid Other | Attending: Family Medicine

## 2019-03-31 DIAGNOSIS — M5440 Lumbago with sciatica, unspecified side: Secondary | ICD-10-CM | POA: Diagnosis not present

## 2019-03-31 DIAGNOSIS — M5412 Radiculopathy, cervical region: Secondary | ICD-10-CM | POA: Diagnosis not present

## 2019-03-31 NOTE — Therapy (Signed)
[facility] [facility] [address] [place], [place], [address] Phone: [phone]   Fax:  [phone]  Physical Therapy Treatment  Patient Details  Name: Morgan Stevens MRN: [id number] Date of Birth: 1965-11-16 Referring Provider (PT): Christiansen, [place]   Encounter Date: 03/31/2019  PT End of Session - 03/31/19 1725    Visit Number  3    Number of Visits  13    Date for PT Re-Evaluation  04/21/19    Authorization Type  [employer]    PT Start Time  1600    PT Stop Time  1645    PT Time Calculation (min)  45 min    Activity Tolerance  Patient tolerated treatment well;No increased pain    Behavior During Therapy  WFL for tasks assessed/performed       Past Medical History:  Diagnosis Date   Gastritis    Hiatal hernia    Obesity (BMI 30-39.9)    Reflux    Thyroid disease     Past Surgical History:  Procedure Laterality Date   CARDIAC CATHETERIZATION  02/27/2016   Procedure: Left Heart Cath and Coronary Angiography;  Surgeon: Corky CraftsJayadeep S Varanasi, MD;  [facility];  Service: Cardiovascular;;   CESAREAN SECTION     CHOLECYSTECTOMY     THYROIDECTOMY     THYROIDECTOMY      There were no vitals filed for this visit.  Subjective Assessment - 03/31/19 1604    Subjective  Pt reports 7/10 L LE radicular sx and 3/10 R UE sx. Pt states no major changes since last session and her HEP is going well.    Pertinent History  Thyroidectomy, Hiatial hernia, Chronic LBP/neck pain    Limitations  Sitting;Lifting;Standing;Walking    How long can you sit comfortably?  1 hour    How long can you stand comfortably?  1 hour    Patient Stated Goals  To decrease pain    Currently in Pain?  Yes    Pain Score  7     Pain Location  Leg    Pain Orientation  Left;Lateral    Pain Descriptors / Indicators  Constant;Burning    Pain Onset  More than a month ago    Pain Score  3    Pain Location  Arm    Pain Orientation   Left    Pain Descriptors / Indicators  Burning;Constant    Pain Onset  More than a month ago       TREATMENT  Therapeutic Exercise to improve mobility, strength,  and reduce pain.  Prone Press ups 2x5 Prone on Elbows x 2min Standing Lumbar Ext with UE supported 2x10 Standing side glides with UE supported, bilaterally x15 Standing Lumbar rotation with PVC pipe x15 Standing Lumbar Ext with towel assist for OP 2x15    Manual therapy to reduce pain response. Performed in prone position with 1 pillow under chest to promote ext STM to paraspinals (lumbar region) and superficial multifidus PA mobilizations L3-L5 to decrease pain and spasms along spinous processes grade I/II for 2 x 30s in each position Central PA joint mobilizations L4-L5 grade I 3x 30s   At the end of the session, pt reported a 5/10 decreased pain.             PT Education - 03/31/19 1724    Education Details  Pt educated on technique/form. Modified HEP to add prone on elbows, prone press ups, and standing lumbar ext  with towel assist to tolerance.    Person(s) Educated  Patient    Methods  Explanation;Demonstration;Tactile cues;Verbal cues    Comprehension  Verbalized understanding;Returned demonstration          PT Long Term Goals - 03/10/19 1659      PT LONG TERM GOAL #1   Title  Patient will be independent with HEP to continue benefits of therapy after discharge    Baseline  Dependent with form/technique    Time  6    Period  Weeks    Status  New    Target Date  04/07/19      PT LONG TERM GOAL #2   Title  Patient will have a worst pain of 2/10 in the cervical and 2/10 in the lumbar spine to better be able to perform greater amount of standing and use of her UE with less pain.    Baseline  cervical: 8/10; lumbar 7/10 worst pain    Time  6    Period  Weeks    Status  New    Target Date  04/07/19      PT LONG TERM GOAL #3   Title  Patient will be able to drive in the car or sit for > 2  hours without increase in pain to better be able to travel with less.    Baseline  drive 1 hour with less pain    Time  6    Period  Weeks    Status  New    Target Date  04/21/19      PT LONG TERM GOAL #4   Title  Patient will have no radiating symptoms down her UE or LE B to better be able to perform exercises in standing.    Baseline  Radiating pain down LE and UE B    Time  6    Period  Weeks    Status  New    Target Date  03/22/19            Plan - 03/31/19 1726    Clinical Impression Statement  Pt presents with recent exacerbation of LBP (L>R) with radicular sx. Pt responded well to active standing lumbar ROM exercises (lumbar ext with towel) followed by prone exercises (prone press ups and prone on elbows). Pt initially had difficulty and was nonresponsive with manual therapy techniques and exercises in prone but with standing active movement, pt able to return to prone with decreased difficulty and reduced pain response. At the end of the session, pt reported a reduced pain score of 5/10, indicating a significant pain reduction intrasession. Pt continues to present with general hypomobility through cervical and lumbar region, increased pain response, and deficits in strength and activity tolerance. Pt will continue to benefit from skilled therapy treatment to return to prior level of function.    Personal Factors and Comorbidities  Age;Behavior Pattern;Comorbidity 2;Comorbidity 3+    Comorbidities  thyroidectomy, chronic pain, hernia    Examination-Activity Limitations  Bend;Carry;Sit;Lift;Transfers;Squat    Examination-Participation Restrictions  Community Activity;Driving    Stability/Clinical Decision Making  Evolving/Moderate complexity    Rehab Potential  Fair    PT Frequency  2x / week    PT Duration  6 weeks    PT Treatment/Interventions  Manual techniques;Patient/family education;Electrical Stimulation;Cryotherapy;Aquatic Therapy;Moist Heat;Traction;Stair training;Gait  training;Therapeutic activities;Therapeutic exercise;Neuromuscular re-education;Dry needling;Spinal Manipulations;Joint Manipulations    PT Next Visit Plan  Progress AROM    PT Home Exercise Plan  See education section  Consulted and Agree with Plan of Care  Patient       Patient will benefit from skilled therapeutic intervention in order to improve the following deficits and impairments:  Decreased activity tolerance, Decreased coordination, Decreased strength, Hypermobility, Pain, Postural dysfunction, Improper body mechanics, Decreased range of motion, Decreased balance, Difficulty walking, Increased muscle spasms  Visit Diagnosis: 1. Radiculopathy, cervical region   2. Low back pain with sciatica, sciatica laterality unspecified, unspecified back pain laterality, unspecified chronicity        Problem List Patient Active Problem List   Diagnosis Date Noted   Pain in the chest    Abnormal cardiac function test 02/26/2016   Obesity (BMI 30-39.9)    Angina pectoris ([facility]) 02/25/2016   Dyspnea 02/25/2016   Hypothyroidism 02/25/2016   Chest pain 09/07/2015    Sanda Lingerhomas Jones, SPT 03/31/2019, 5:40 PM  [facility] [address] [place], [place], [address] Phone: [phone]   Fax:  [phone]  Name: Morgan Stevens MRN: [id number] Date of Birth: Nov 15, 1965

## 2019-04-01 DIAGNOSIS — R6884 Jaw pain: Secondary | ICD-10-CM | POA: Diagnosis not present

## 2019-04-02 ENCOUNTER — Ambulatory Visit: Payer: Medicaid Other | Admitting: Gastroenterology

## 2019-04-02 ENCOUNTER — Encounter: Payer: Self-pay | Admitting: Gastroenterology

## 2019-04-02 ENCOUNTER — Other Ambulatory Visit: Payer: Self-pay

## 2019-04-02 VITALS — BP 141/84 | HR 72 | Temp 98.7°F | Ht 64.0 in | Wt 228.2 lb

## 2019-04-02 DIAGNOSIS — R1319 Other dysphagia: Secondary | ICD-10-CM

## 2019-04-02 DIAGNOSIS — K219 Gastro-esophageal reflux disease without esophagitis: Secondary | ICD-10-CM

## 2019-04-02 DIAGNOSIS — R1084 Generalized abdominal pain: Secondary | ICD-10-CM

## 2019-04-02 DIAGNOSIS — R1310 Dysphagia, unspecified: Secondary | ICD-10-CM | POA: Diagnosis not present

## 2019-04-02 DIAGNOSIS — Z1211 Encounter for screening for malignant neoplasm of colon: Secondary | ICD-10-CM

## 2019-04-02 NOTE — Progress Notes (Signed)
Morgan Stevens [address]  [place]  [place], [place] [address]  Main: [phone]  Fax: [phone]   Gastroenterology Consultation  Referring Provider:     Christiansen, [place] Ni* Primary Care Physician:  Christiansen, [place] Nicole, PA-C Reason for Consultation:     GERD, abdominal pain        HPI:    Chief Complaint  Patient presents with  . New Patient (Initial Visit)    screenig colonoscopy, GERD, Schatzki's Ring Distal Colon    Morgan Stevens is a 53 y.o. y/o female referred for consultation & management  by Dr. Duayne Cal, [place] Nicole, PA-C.  Patient reports chronic history of abdominal pain, intermittent, diffuse, dull, 5/10, nonradiating.  No nausea or vomiting.  Reports intermittent dysphagia to solid foods, mostly bread, about once a week.  No weight loss.  No nausea or vomiting.  No prior history of food impaction.  Reports chronic constipation as well.  Has a bowel movement every 2 to 3 days.  Does not use anything to go to the bathroom.  Does not eat high-fiber diet.  Takes omeprazole once daily.  She reports some regurgitation about once a week, but no other symptoms of reflux despite that.  Previously had an EGD at [facility], January 2019 which showed a wide-open Schatzki's ring, grade a esophagitis.  Tortuous esophagus.  2 cm hiatal hernia.  Few gastric polyps.  Pathology showed fundic gland polyps, no intestinal metaplasia on stomach biopsies.  Chronic active gastritis.  Negative H. pylori.  GE junction biopsies with squamocolumnar junction with reflux related changes, no intestinal metaplasia.  Esophageal biopsies with no increase in intraepithelial eosinophils.  Past Medical History:  Diagnosis Date  . Gastritis   . Hiatal hernia   . Obesity (BMI 30-39.9)   . Reflux   . Thyroid disease     Past Surgical History:  Procedure Laterality Date  . CARDIAC CATHETERIZATION  02/27/2016   Procedure: Left Heart Cath and Coronary Angiography;  Surgeon:  Jettie Booze, MD;  Location: [place] CV [facility];  Service: Cardiovascular;;  . CESAREAN SECTION    . CHOLECYSTECTOMY    . THYROIDECTOMY    . THYROIDECTOMY      Prior to Admission medications   Medication Sig Start Date End Date Taking? Authorizing Provider  cyclobenzaprine (FLEXERIL) 10 MG tablet Take 1 tablet (10 mg total) by mouth 3 (three) times daily as needed for muscle spasms. 01/13/19  Yes Delman Kitten, MD  levothyroxine (SYNTHROID, LEVOTHROID) 137 MCG tablet Take 137 mcg by mouth daily before breakfast.   Yes [provider]  omeprazole (PRILOSEC) 20 MG capsule Take 20 mg by mouth 2 (two) times daily before a meal.    Yes [provider]  gabapentin (NEURONTIN) 300 MG capsule Take 1 capsule (300 mg total) by mouth 3 (three) times daily. Patient not taking: Reported on 04/02/2019 03/16/17   Hinda Kehr, MD  omeprazole (PRILOSEC) 40 MG capsule Take 1 capsule (40 mg total) by mouth daily for 30 days. Patient not taking: Reported on 02/13/2019 11/01/18 02/13/19  Gregor Hams, MD  ranitidine (ZANTAC) 150 MG tablet Take 1 tablet (150 mg total) by mouth 2 (two) times daily. Patient not taking: Reported on 02/13/2019 07/28/16 02/13/19  Merlyn Lot, MD    Family History  Problem Relation Age of Onset  . Breast cancer Neg Hx      Social History   Tobacco Use  . Smoking status: Never Smoker  . Smokeless tobacco: Never Used  Substance Use Topics  . Alcohol use: No    Alcohol/week: 0.0 standard drinks  . Drug use: No    Allergies as of 04/02/2019  . (No Known Allergies)    Review of Systems:    All systems reviewed and negative except where noted in HPI.   Physical Exam:  BP (!) 141/84   Pulse 72   Temp 98.7 F (37.1 C) (Oral)   Ht 5\' 4"  (1.626 m)   Wt 228 lb 3.2 oz (103.5 kg)   BMI 39.17 kg/m  No LMP recorded. Psych:  Alert and cooperative. Normal mood and affect. General:   Alert,  Well-developed, well-nourished, pleasant and cooperative in  NAD Head:  Normocephalic and atraumatic. Eyes:  Sclera clear, no icterus.   Conjunctiva pink. Ears:  Normal auditory acuity. Nose:  No deformity, discharge, or lesions. Mouth:  No deformity or lesions,oropharynx pink & moist. Neck:  Supple; no masses or thyromegaly. Abdomen:  Normal bowel sounds.  No bruits.  Soft, non-tender and non-distended without masses, hepatosplenomegaly or hernias noted.  No guarding or rebound tenderness.    Msk:  Symmetrical without gross deformities. Good, equal movement & strength bilaterally. Pulses:  Normal pulses noted. Extremities:  No clubbing or edema.  No cyanosis. Neurologic:  Alert and oriented x3;  grossly normal neurologically. Skin:  Intact without significant lesions or rashes. No jaundice. Lymph Nodes:  No significant cervical adenopathy. Psych:  Alert and cooperative. Normal mood and affect.   Labs: CBC    Component Value Date/Time   WBC 6.5 02/13/2019 1543   RBC 4.07 02/13/2019 1543   HGB 12.1 02/13/2019 1543   HGB 11.8 (L) 08/02/2014 1624   HCT 34.9 (L) 02/13/2019 1543   HCT 35.6 08/02/2014 1624   PLT 302 02/13/2019 1543   PLT 258 08/02/2014 1624   MCV 85.7 02/13/2019 1543   MCV 90 08/02/2014 1624   MCH 29.7 02/13/2019 1543   MCHC 34.7 02/13/2019 1543   RDW 13.6 02/13/2019 1543   RDW 13.5 08/02/2014 1624   LYMPHSABS 3.0 03/23/2018 2350   LYMPHSABS 2.9 09/30/2013 2005   MONOABS 0.4 03/23/2018 2350   MONOABS 0.6 09/30/2013 2005   EOSABS 0.2 03/23/2018 2350   EOSABS 0.2 09/30/2013 2005   BASOSABS 0.1 03/23/2018 2350   BASOSABS 0.1 09/30/2013 2005   CMP     Component Value Date/Time   NA 137 02/13/2019 1543   NA 139 08/02/2014 1624   K 3.4 (L) 02/13/2019 1543   K 3.9 08/02/2014 1624   CL 107 02/13/2019 1543   CL 103 08/02/2014 1624   CO2 20 (L) 02/13/2019 1543   CO2 27 08/02/2014 1624   GLUCOSE 111 (H) 02/13/2019 1543   GLUCOSE 113 (H) 08/02/2014 1624   BUN 8 02/13/2019 1543   BUN 10 08/02/2014 1624   CREATININE 0.82  02/13/2019 1543   CREATININE 0.85 08/02/2014 1624   CALCIUM 8.8 (L) 02/13/2019 1543   CALCIUM 8.6 08/02/2014 1624   PROT 6.4 (L) 02/13/2019 1543   PROT 7.3 08/02/2014 1624   ALBUMIN 3.6 02/13/2019 1543   ALBUMIN 3.6 08/02/2014 1624   AST 20 02/13/2019 1543   AST 22 08/02/2014 1624   ALT 26 02/13/2019 1543   ALT 26 08/02/2014 1624   ALKPHOS 29 (L) 02/13/2019 1543   ALKPHOS 40 (L) 08/02/2014 1624   BILITOT 0.7 02/13/2019 1543   BILITOT 0.4 08/02/2014 1624   GFRNONAA >60 02/13/2019 1543   GFRNONAA >60 08/02/2014 1624   GFRNONAA >60 05/06/2014 2154  GFRAA >60 02/13/2019 1543   GFRAA >60 08/02/2014 1624   GFRAA >60 05/06/2014 2154    Imaging Studies: No results found.  Assessment and Plan:   Morgan Stevens is a 53 y.o. y/o female has been referred for GERD  Patient reports intermittent dysphagia, and had grade a esophagitis in the past with wide open Schatzki's ring  Further evaluation with EGD indicated to rule out underlying strictures or narrowing and reevaluate site of esophagitis  Screening colonoscopy also indicated at this time  Patient educated extensively on acid reflux lifestyle modification, including buying a bed wedge, not eating 3 hrs before bedtime, diet modifications, and handout given for the same.   I have discussed alternative options, risks & benefits,  which include, but are not limited to, bleeding, infection, perforation,respiratory complication & drug reaction.  The patient agrees with this plan & written consent will be obtained.    High-fiber diet MiraLAX or Metamucil daily with goal of 1-2 soft bowel movements daily.  If not at goal, patient instructed to increase dose to twice daily.  If loose stools with the medication, patient asked to decrease the medication to every other day, or half dose daily.  Patient verbalized understanding  Her prep will include starting MiraLAX 17 g once daily for 7 days prior to her colonoscopy due to her chronic  constipation.  Bisacodyl 10 mg p.o. x1 2 hours before starting her prep.  Split prep as per instructions    Dr Melodie BouillonVarnita Tereso Unangst  Speech recognition software was used to dictate the above note.

## 2019-04-06 ENCOUNTER — Other Ambulatory Visit: Payer: Self-pay

## 2019-04-06 DIAGNOSIS — Z1211 Encounter for screening for malignant neoplasm of colon: Secondary | ICD-10-CM

## 2019-04-06 DIAGNOSIS — R1084 Generalized abdominal pain: Secondary | ICD-10-CM

## 2019-04-06 DIAGNOSIS — K219 Gastro-esophageal reflux disease without esophagitis: Secondary | ICD-10-CM

## 2019-04-07 ENCOUNTER — Ambulatory Visit: Payer: Medicaid Other

## 2019-04-08 ENCOUNTER — Ambulatory Visit: Payer: Medicaid Other

## 2019-04-09 ENCOUNTER — Other Ambulatory Visit: Payer: Self-pay

## 2019-04-09 ENCOUNTER — Emergency Department
Admission: EM | Admit: 2019-04-09 | Discharge: 2019-04-10 | Disposition: A | Discharge status: Home / Self Care | Payer: Medicaid Other | Attending: Emergency Medicine | Admitting: Emergency Medicine

## 2019-04-09 ENCOUNTER — Emergency Department: Payer: Medicaid Other

## 2019-04-09 DIAGNOSIS — Z20828 Contact with and (suspected) exposure to other viral communicable diseases: Secondary | ICD-10-CM | POA: Insufficient documentation

## 2019-04-09 DIAGNOSIS — R0602 Shortness of breath: Secondary | ICD-10-CM | POA: Diagnosis not present

## 2019-04-09 DIAGNOSIS — R079 Chest pain, unspecified: Secondary | ICD-10-CM | POA: Diagnosis not present

## 2019-04-09 DIAGNOSIS — R0789 Other chest pain: Secondary | ICD-10-CM | POA: Diagnosis not present

## 2019-04-09 DIAGNOSIS — E039 Hypothyroidism, unspecified: Secondary | ICD-10-CM | POA: Diagnosis not present

## 2019-04-09 DIAGNOSIS — K219 Gastro-esophageal reflux disease without esophagitis: Secondary | ICD-10-CM | POA: Diagnosis not present

## 2019-04-09 DIAGNOSIS — Z79899 Other long term (current) drug therapy: Secondary | ICD-10-CM | POA: Diagnosis not present

## 2019-04-09 DIAGNOSIS — M549 Dorsalgia, unspecified: Secondary | ICD-10-CM | POA: Diagnosis not present

## 2019-04-09 LAB — BASIC METABOLIC PANEL
Anion gap: 9 (ref 5–15)
BUN: 10 mg/dL (ref 6–20)
CO2: 24 mmol/L (ref 22–32)
Calcium: 9.3 mg/dL (ref 8.9–10.3)
Chloride: 105 mmol/L (ref 98–111)
Creatinine, Ser: 0.75 mg/dL (ref 0.44–1.00)
GFR calc Af Amer: >60 mL/min (ref >60)
GFR calc non Af Amer: >60 mL/min (ref >60)
Glucose, Bld: 115 mg/dL — ABNORMAL HIGH (ref 70–99)
Potassium: 3.3 mmol/L — ABNORMAL LOW (ref 3.5–5.1)
Sodium: 138 mmol/L (ref 135–145)

## 2019-04-09 LAB — CBC
HCT: 35.7 % — ABNORMAL LOW (ref 36.0–46.0)
Hemoglobin: 12.5 g/dL (ref 12.0–15.0)
MCH: 30 pg (ref 26.0–34.0)
MCHC: 35 g/dL (ref 30.0–36.0)
MCV: 85.8 fL (ref 80.0–100.0)
Platelets: 279 10*3/uL (ref 150–400)
RBC: 4.16 MIL/uL (ref 3.87–5.11)
RDW: 13.5 % (ref 11.5–15.5)
WBC: 6.2 10*3/uL (ref 4.0–10.5)
nRBC: 0 % (ref 0.0–0.2)

## 2019-04-09 LAB — TROPONIN I (HIGH SENSITIVITY)
Troponin I (High Sensitivity): <2 ng/L (ref <18)
Troponin I (High Sensitivity): 3 ng/L (ref <18)

## 2019-04-09 IMAGING — DX CHEST  1 VIEW
1 series · 1 of 1 positions shown · non-contrast
Comparison: Chest radiograph dated [DATE]

CLINICAL DATA: 52-year-old female with shortness of breath.

EXAM:
CHEST  1 VIEW

[chest ap]
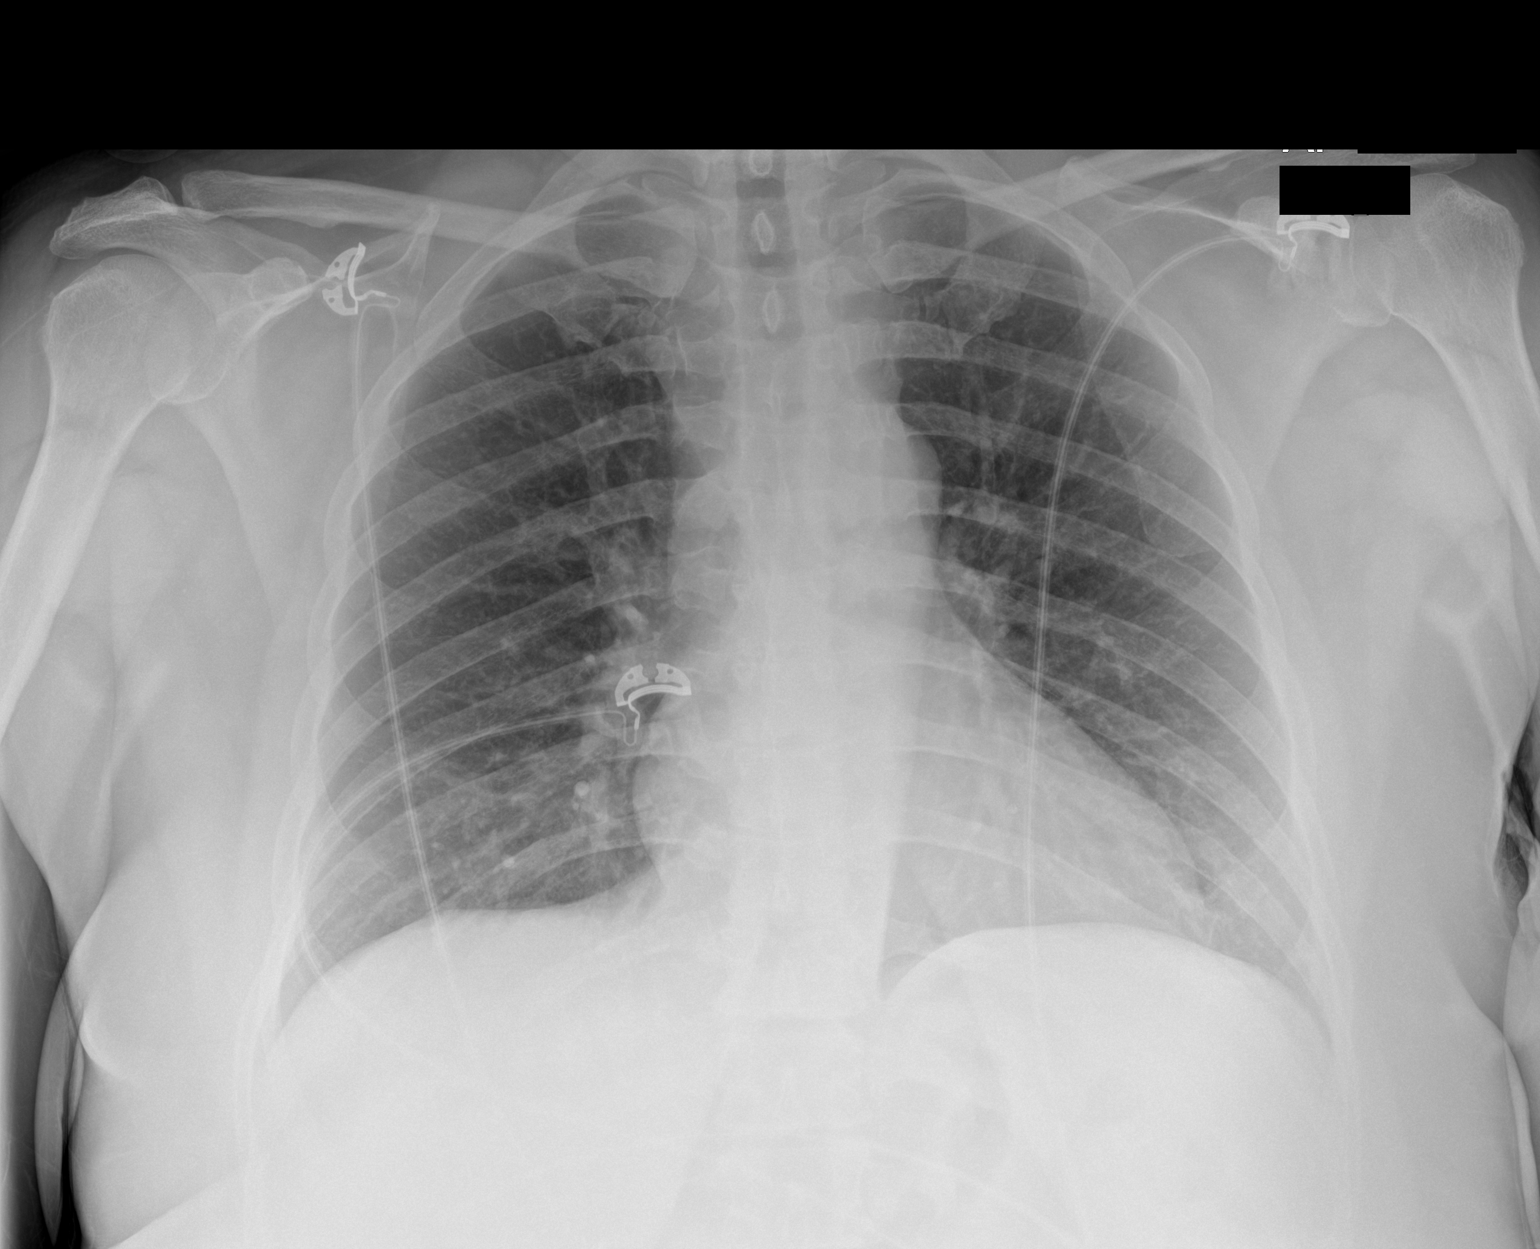

[1 of 1 positions shown; findings below may reference images not displayed]

FINDINGS: The heart size and mediastinal contours are within normal limits.
Both lungs are clear. The visualized skeletal structures are
unremarkable.
IMPRESSION: No active disease.

## 2019-04-09 MED ORDER — ALUM & MAG HYDROXIDE-SIMETH 200-200-20 MG/5ML PO SUSP
30.0000 mL | Freq: Once | ORAL | Status: AC
  Administered 2019-04-10: 30 mL via ORAL
  Filled 2019-04-09: qty 30

## 2019-04-09 MED ORDER — FAMOTIDINE IN NACL 20-0.9 MG/50ML-% IV SOLN
20.0000 mg | Freq: Once | INTRAVENOUS | Status: AC
  Administered 2019-04-10: 20 mg via INTRAVENOUS
  Filled 2019-04-09: qty 50

## 2019-04-09 MED ORDER — LIDOCAINE VISCOUS HCL 2 % MT SOLN
15.0000 mL | Freq: Once | OROMUCOSAL | Status: AC
  Administered 2019-04-10: 15 mL via ORAL
  Filled 2019-04-09: qty 15

## 2019-04-09 MED ORDER — SODIUM CHLORIDE 0.9% FLUSH: 3.0000 mL | Freq: Once | INTRAVENOUS | Status: DC

## 2019-04-09 MED ORDER — ACETAMINOPHEN 500 MG PO TABS
1000.0000 mg | ORAL_TABLET | Freq: Once | ORAL | Status: AC
  Administered 2019-04-10: 1000 mg via ORAL
  Filled 2019-04-09: qty 2

## 2019-04-09 NOTE — ED Triage Notes (Signed)
Pt comes via POV from home with c/o CP and SOB. Pt states this started about 45 minutes ago.   Pt states central chest pain. Pt describes pain as sharp and that it started in her back.  Pt also states nausea and dizziness.

## 2019-04-10 ENCOUNTER — Other Ambulatory Visit: Payer: Self-pay

## 2019-04-10 ENCOUNTER — Other Ambulatory Visit
Admission: RE | Admit: 2019-04-10 | Discharge: 2019-04-10 | Disposition: A | Discharge status: Home / Self Care | Payer: Medicaid Other | Source: Ambulatory Visit | Attending: Gastroenterology | Admitting: Gastroenterology

## 2019-04-10 ENCOUNTER — Encounter: Payer: Self-pay | Admitting: Radiology

## 2019-04-10 ENCOUNTER — Emergency Department: Payer: Medicaid Other

## 2019-04-10 DIAGNOSIS — M549 Dorsalgia, unspecified: Secondary | ICD-10-CM | POA: Diagnosis not present

## 2019-04-10 DIAGNOSIS — R079 Chest pain, unspecified: Secondary | ICD-10-CM | POA: Diagnosis not present

## 2019-04-10 LAB — POCT PREGNANCY, URINE: Preg Test, Ur: NEGATIVE

## 2019-04-10 IMAGING — CT CT ANGIOGRAPHY CHEST
4 of 7 series · 18 of 46 positions shown · IV contrast (omnipaque)
Comparison: Chest radiograph dated [DATE] and CT dated
[DATE].

CLINICAL DATA: 52-year-old female with chest and back pain. Concern
for aortic dissection.

EXAM:
CT ANGIOGRAPHY CHEST WITH CONTRAST
TECHNIQUE: Multidetector CT imaging of the chest was performed using the
standard protocol during bolus administration of intravenous
contrast. Multiplanar CT image reconstructions and MIPs were
obtained to evaluate the vascular anatomy.
CONTRAST:  100mL OMNIPAQUE IOHEXOL 350 MG/ML SOLN

[Series 4: axial pre · axial · non-contrast · 0.73mm/px · z∈[-346,-166]mm · 5 of 56 slices shown]
[im 10/56  lung]
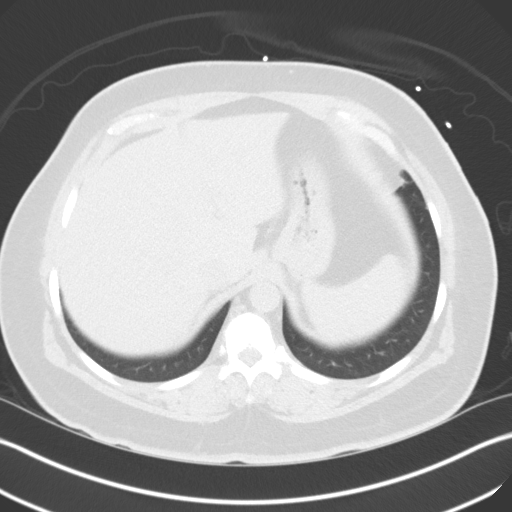
[im 19/56  lung]
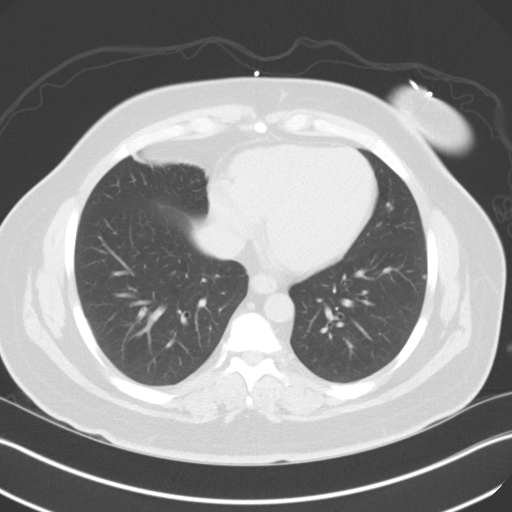
[im 28/56  lung]
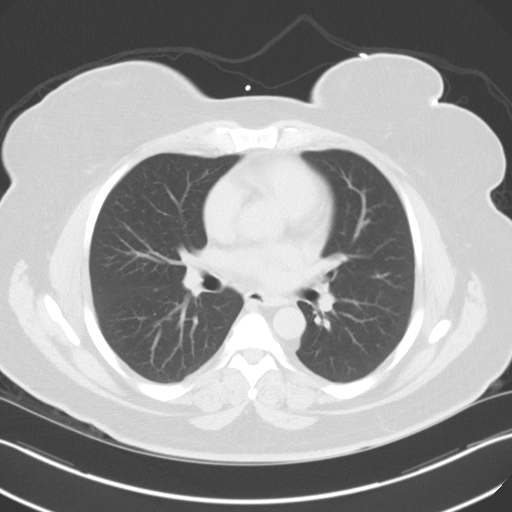
[im 37/56  lung]
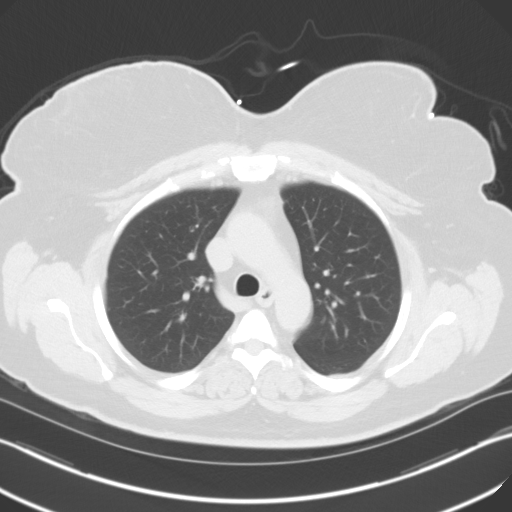
[im 46/56  lung]
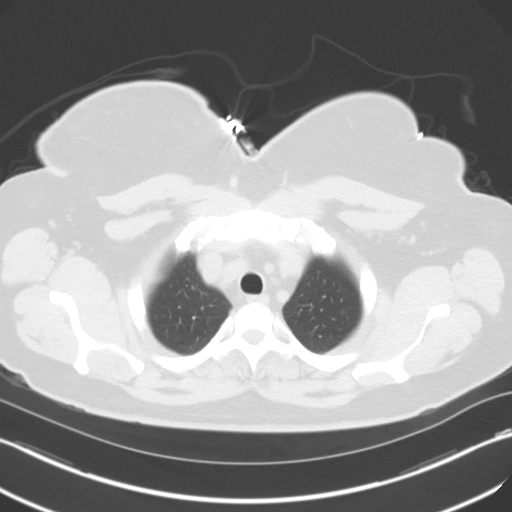

[Series 5: axial arterial · axial · arterial · 0.73mm/px · z∈[-356,-146]mm · 8 of 91 slices shown]
[im 11/91  lung]
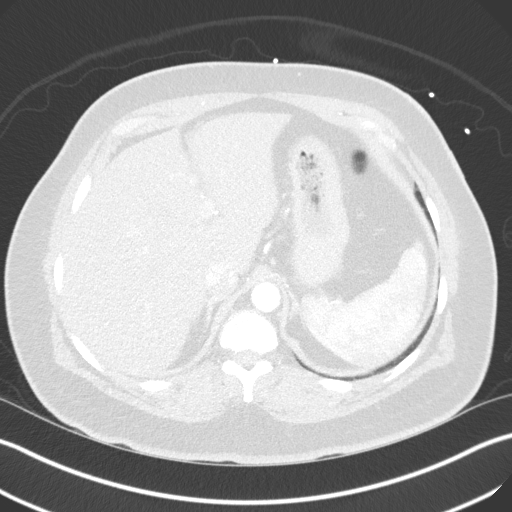
[im 21/91  soft-tissue]
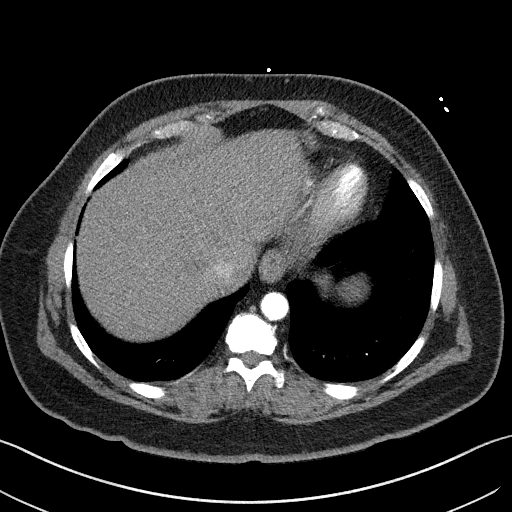
[im 31/91  lung]
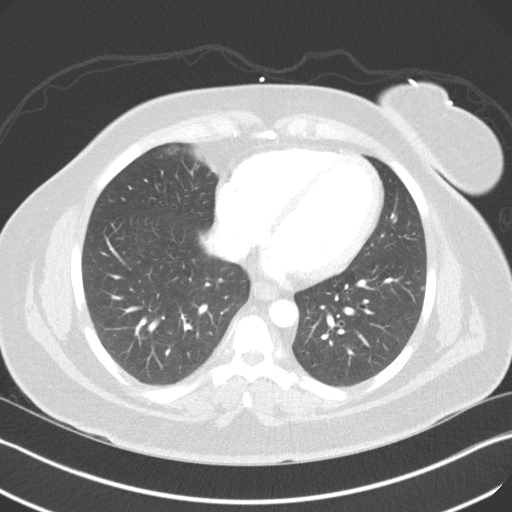
[im 41/91  soft-tissue]
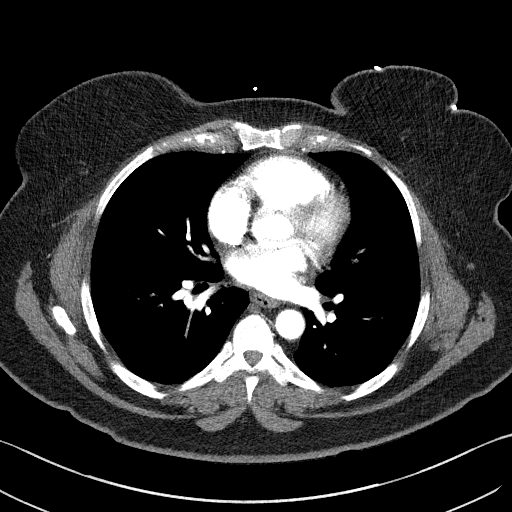
[im 51/91  lung]
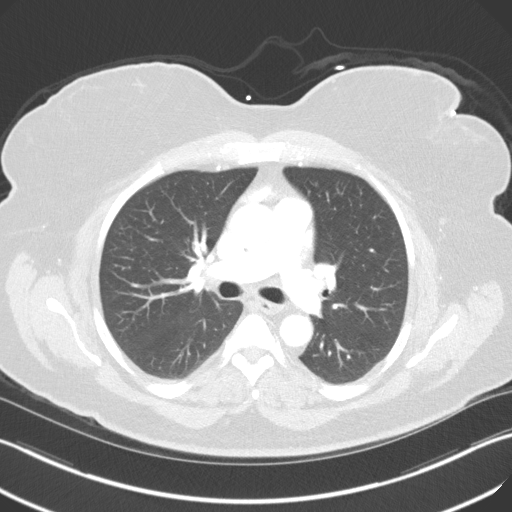
[im 61/91  soft-tissue]
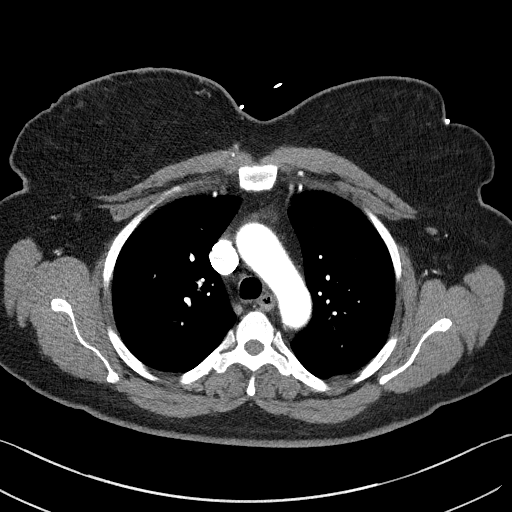
[im 71/91  lung]
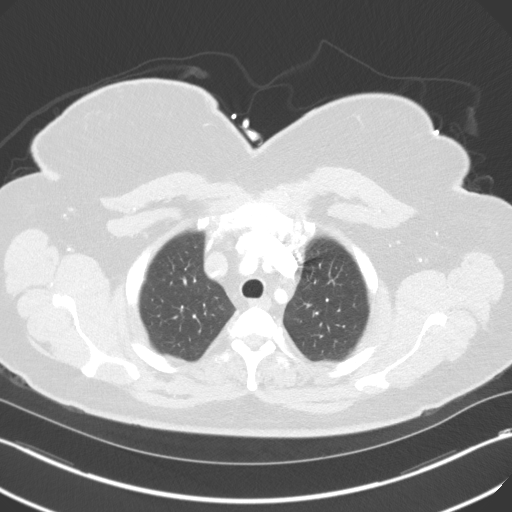
[im 81/91  soft-tissue]
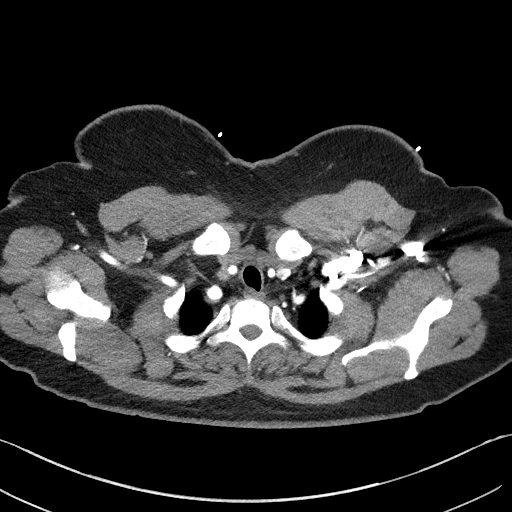

[Series 6: lung · axial · 0.73mm/px · z∈[-368,-330]mm · 2 of 136 slices shown]
[im 10/136  soft-tissue]
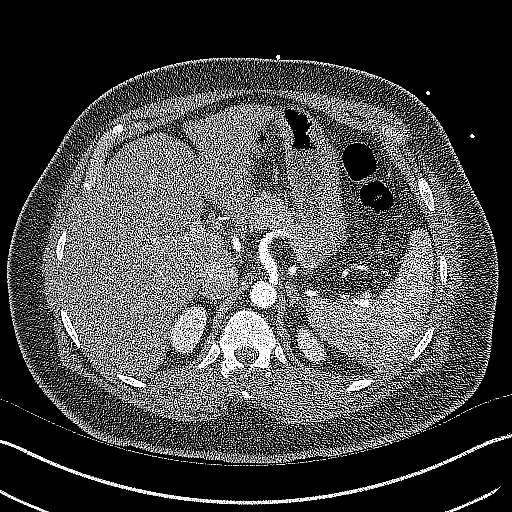
[im 29/136  soft-tissue]
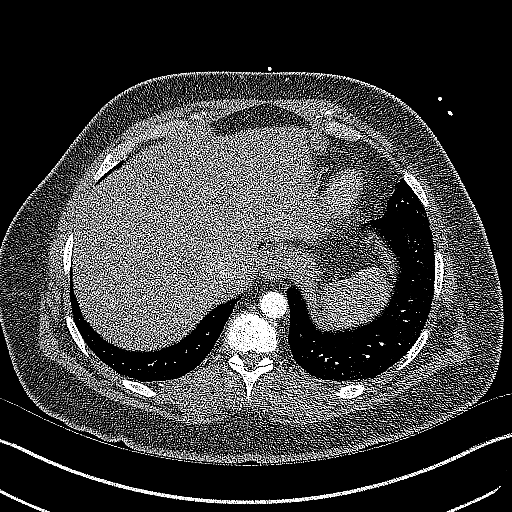

[Series 7: coronals · coronal · 0.56mm/px · 3 of 136 slices shown]
[im 34/136  soft-tissue]
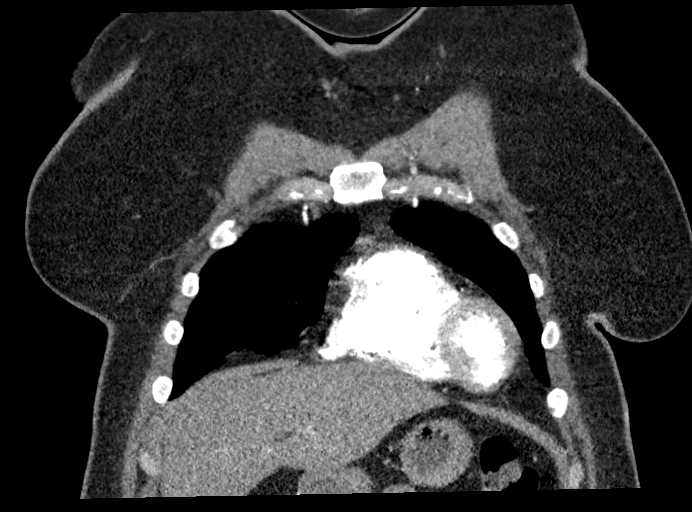
[im 68/136  soft-tissue]
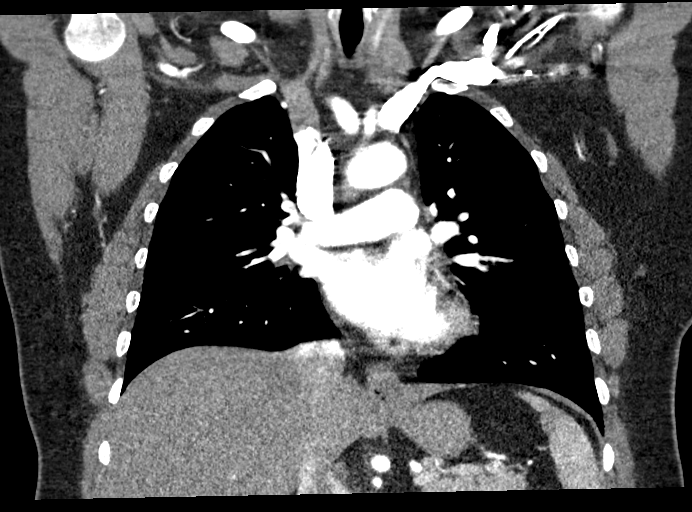
[im 102/136  soft-tissue]
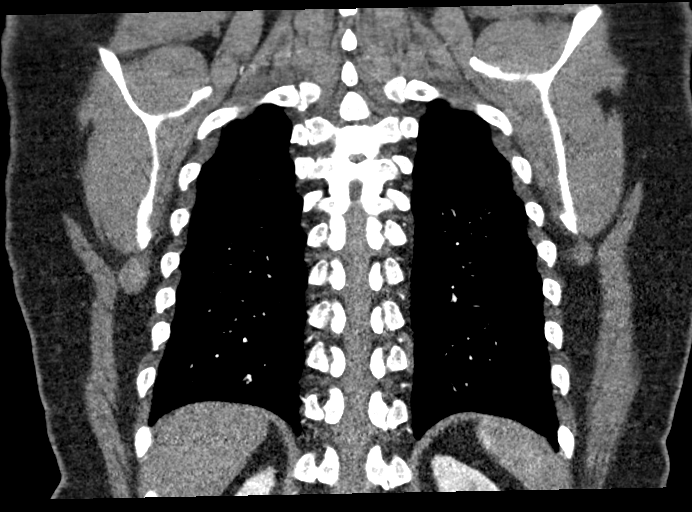

[18 of 46 positions shown; findings below may reference images not displayed]

FINDINGS: Cardiovascular: There is no cardiomegaly or pericardial effusion.
The thoracic aorta is unremarkable. The origins of the great vessels
of the aortic arch appear patent. No pulmonary artery embolus
identified.

Mediastinum/Nodes: There is no hilar or mediastinal adenopathy. The
esophagus is grossly unremarkable. No mediastinal fluid collection.

Lungs/Pleura: The lungs are clear. There is no pleural effusion or
pneumothorax. The central airways are patent.

Upper Abdomen: Probable fatty infiltration of the liver.
Cholecystectomy.

Musculoskeletal: No chest wall abnormality. No acute or significant
osseous findings.

Review of the MIP images confirms the above findings.
IMPRESSION: No acute intrathoracic pathology.  No aortic dissection.

## 2019-04-10 MED ORDER — ONDANSETRON 4 MG PO TBDP: 4.0000 mg | ORAL_TABLET | Freq: Three times a day (TID) | ORAL | 0 refills | Status: DC | PRN

## 2019-04-10 MED ORDER — ALUM & MAG HYDROXIDE-SIMETH 400-400-40 MG/5ML PO SUSP: 5.0000 mL | Freq: Four times a day (QID) | ORAL | 0 refills | Status: DC | PRN

## 2019-04-10 MED ORDER — IOHEXOL 350 MG/ML SOLN
100.0000 mL | Freq: Once | INTRAVENOUS | Status: AC | PRN
  Administered 2019-04-10: 100 mL via INTRAVENOUS

## 2019-04-10 MED ORDER — OMEPRAZOLE 40 MG PO CPDR: 40.0000 mg | DELAYED_RELEASE_CAPSULE | Freq: Two times a day (BID) | ORAL | 1 refills | Status: DC

## 2019-04-10 NOTE — Discharge Instructions (Addendum)

## 2019-04-10 NOTE — ED Provider Notes (Signed)
[facility] Emergency Department Provider Note  ____________________________________________  Time seen: Approximately 12:14 AM  I have reviewed the triage vital signs and the nursing notes.   HISTORY  Chief Complaint Chest Pain and Shortness of Breath   HPI Morgan Stevens is a 53 y.o. female with a history of GERD, gastritis, hiatal hernia who presents for evaluation of chest pain.  Patient reports that she was bending over to pick up an object on the floor when she developed severe sudden onset stabbing pain between her shoulder blades in her upper back which radiated across to her chest.  She is complaining of tightness in her chest.  Her pain is currently 5 out of 10.  The pain has improved significantly since it started 4 hours ago but is still present.  No shortness of breath, no cough, no fever, no numbness or weakness of her extremities, no abdominal pain.  She reports feeling some dizziness and nausea with the onset of the pain.  She reports that this pain is different than her GERD pain.  She denies any personal or family history of heart disease, PE or DVT, no recent travel immobilization, no leg pain or swelling, no hemoptysis, no exogenous hormones.  Past Medical History:  Diagnosis Date   Gastritis    Hiatal hernia    Obesity (BMI 30-39.9)    Reflux    Thyroid disease     Patient Active Problem List   Diagnosis Date Noted   Conversion disorder 06/11/2018   Esophagitis, [place] grade A 09/06/2017   Hiatal hernia 09/06/2017   Schatzki's ring of distal esophagus 09/06/2017   Other constipation 07/05/2017   Sciatica 03/27/2017   Pain in the chest    Abnormal cardiac function test 02/26/2016   Obesity (BMI 30-39.9)    Angina pectoris ([place]) 02/25/2016   Dyspnea 02/25/2016   Hypothyroidism 02/25/2016   Postoperative hypothyroidism 02/25/2016   Chest pain 09/07/2015   GERD (gastroesophageal reflux disease) 01/21/2014    Gestational diabetes 01/21/2014   Tonsillar hypertrophy 01/21/2014   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 Authorizing Provider  alum & mag hydroxide-simeth (MAALOX MAX) 400-400-40 MG/5ML suspension Take 5 mLs by mouth every 6 (six) hours as needed for indigestion. 04/10/19   Rudene Re, MD  cyclobenzaprine (FLEXERIL) 10 MG tablet Take 1 tablet (10 mg total) by mouth 3 (three) times daily as needed for muscle spasms. 01/13/19   Delman Kitten, MD  gabapentin (NEURONTIN) 300 MG capsule Take 1 capsule (300 mg total) by mouth 3 (three) times daily. Patient not taking: Reported on 04/02/2019 03/16/17   Hinda Kehr, MD  levothyroxine (SYNTHROID, LEVOTHROID) 137 MCG tablet Take 137 mcg by mouth daily before breakfast.    [provider]  omeprazole (PRILOSEC) 40 MG capsule Take 1 capsule (40 mg total) by mouth 2 (two) times daily before a meal. 04/10/19   Alfred Levins, [place], MD  ondansetron (ZOFRAN ODT) 4 MG disintegrating tablet Take 1 tablet (4 mg total) by mouth every 8 (eight) hours as needed. 04/10/19   Rudene Re, MD  ranitidine (ZANTAC) 150 MG tablet Take 1 tablet (150 mg total) by mouth 2 (two) times daily. Patient not taking: Reported on 02/13/2019 07/28/16 02/13/19  Willy Eddyobinson, Patrick, MD    Allergies Patient has no known allergies.  Family History  Problem Relation Age of Onset   Breast cancer Neg Hx     Social History Social History   Tobacco Use   Smoking status: Never Smoker   Smokeless tobacco: Never Used  Substance Use Topics   Alcohol use: No     Alcohol/week: 0.0 standard drinks   Drug use: No    Review of Systems  Constitutional: Negative for fever. + dizziness Eyes: Negative for visual changes. ENT: Negative for sore throat. Neck: No neck pain  Cardiovascular: + chest pain. Respiratory: Negative for shortness of breath. Gastrointestinal: Negative for abdominal pain, vomiting or diarrhea. + nausea Genitourinary: Negative for dysuria. Musculoskeletal: + upper back pain. Skin: Negative for rash. Neurological: Negative for headaches, weakness or numbness. Psych: No SI or HI  ____________________________________________   PHYSICAL EXAM:  VITAL SIGNS: ED Triage Vitals [04/09/19 2108]  Enc Vitals Group     BP (!) 141/85     Pulse Rate 87     Resp 19     Temp 98.9 F (37.2 C)     Temp src      SpO2 100 %     Weight 228 lb (103.4 kg)     Height 5\' 4"  (1.626 m)     Head Circumference      Peak Flow      Pain Score 8     Pain Loc      Pain Edu?      Excl. in GC?     Constitutional: Alert and oriented. Well appearing and in no apparent distress. HEENT:      Head: Normocephalic and atraumatic.         Eyes: Conjunctivae are normal. Sclera is non-icteric.       Mouth/Throat: Mucous membranes are moist.       Neck: Supple with no signs of meningismus. Cardiovascular: Regular rate and rhythm. No murmurs, gallops, or rubs. 2+ symmetrical distal pulses are present in all extremities. No JVD. Respiratory: Normal respiratory effort. Lungs are clear to auscultation bilaterally. No wheezes, crackles, or rhonchi.  Gastrointestinal: Soft, non tender, and non distended with positive bowel sounds. No rebound or guarding. Genitourinary: No CVA tenderness. Musculoskeletal: Nontender with normal range of motion in all extremities. No edema, cyanosis, or erythema of extremities. Neurologic: Normal speech and language. Face is symmetric. Moving all extremities. No gross focal neurologic deficits are appreciated. Skin: Skin is  warm, dry and intact. No rash noted. Psychiatric: Mood and affect are normal. Speech and behavior are normal.  ____________________________________________   LABS (all labs ordered are listed, but only abnormal results are displayed)  Labs Reviewed  BASIC METABOLIC PANEL - Abnormal; Notable for the following components:      Result Value   Potassium 3.3 (*)    Glucose, Bld 115 (*)    All other components within normal limits  CBC - Abnormal; Notable for the following components:   HCT 35.7 (*)    All other components within normal limits  POC URINE PREG, ED  POCT PREGNANCY, URINE  TROPONIN I (HIGH SENSITIVITY)  TROPONIN I (HIGH SENSITIVITY)   ____________________________________________  EKG  ED ECG REPORT I, Nita Sicklearolina Becket Wecker, the attending physician, personally viewed and interpreted this ECG.  Normal sinus rhythm, rate of 87, normal intervals,  normal axis, no ST elevations or depressions.  Normal EKG. ____________________________________________  RADIOLOGY  I have personally reviewed the images performed during this visit and I agree with the Radiologist's read.   Interpretation by Radiologist:  Dg Chest 1 View  Result Date: 04/09/2019 CLINICAL DATA:  53 year old female with shortness of breath. EXAM: CHEST  1 VIEW COMPARISON:  Chest radiograph dated 02/13/2019 FINDINGS: The heart size and mediastinal contours are within normal limits. Both lungs are clear. The visualized skeletal structures are unremarkable. IMPRESSION: No active disease. Electronically Signed   By: Elgie CollardArash  Radparvar M.D.   On: 04/09/2019 23:17   Ct Angio Chest Aorta W And/or Wo Contrast  Result Date: 04/10/2019 CLINICAL DATA:  53 year old female with chest and back pain. Concern for aortic dissection. EXAM: CT ANGIOGRAPHY CHEST WITH CONTRAST TECHNIQUE: Multidetector CT imaging of the chest was performed using the standard protocol during bolus administration of intravenous contrast. Multiplanar CT  image reconstructions and MIPs were obtained to evaluate the vascular anatomy. CONTRAST:  100mL OMNIPAQUE IOHEXOL 350 MG/ML SOLN COMPARISON:  Chest radiograph dated 04/09/2019 and CT dated 02/13/2019. FINDINGS: Cardiovascular: There is no cardiomegaly or pericardial effusion. The thoracic aorta is unremarkable. The origins of the great vessels of the aortic arch appear patent. No pulmonary artery embolus identified. Mediastinum/Nodes: There is no hilar or mediastinal adenopathy. The esophagus is grossly unremarkable. No mediastinal fluid collection. Lungs/Pleura: The lungs are clear. There is no pleural effusion or pneumothorax. The central airways are patent. Upper Abdomen: Probable fatty infiltration of the liver. Cholecystectomy. Musculoskeletal: No chest wall abnormality. No acute or significant osseous findings. Review of the MIP images confirms the above findings. IMPRESSION: No acute intrathoracic pathology.  No aortic dissection. Electronically Signed   By: Elgie CollardArash  Radparvar M.D.   On: 04/10/2019 00:57     ____________________________________________   PROCEDURES  Procedure(s) performed: None Procedures Critical Care performed:  None ____________________________________________   INITIAL IMPRESSION / ASSESSMENT AND PLAN / ED COURSE   53 y.o. female with a history of GERD, gastritis, hiatal hernia who presents for evaluation of sudden onset of stabbing severe upper back pain radiating to the chest pain. Alanda Slimdx Gerd, PUD, MSK, dissection, ACS. Low suspicion for PE with no tachypnea, tachycardia, hypoxia, no pleuritic chest pain, no hormones, no prior history of such, and no risk factors.  Patient had a catheterization 2017 showing no evidence of coronary artery disease.  Her EKG is nonischemic.  Troponin x2 is negative.  Patient is low risk based on heart score.  Chest x-ray showed no pneumonia or pneumothorax.  CT of the chest showing no evidence of dissection or PE. Patient given IV pepcid and GI  cocktail with improvement of symptoms. Will increase her home omeprazole and add zofran and maalox PRN. Recommended close f/u with PCP and discussed my standard return precautions.       As part of my medical decision making, I reviewed the following data within the electronic MEDICAL RECORD NUMBER Nursing notes reviewed and incorporated, Labs reviewed , EKG interpreted , Old EKG reviewed, Old chart reviewed, Radiograph reviewed , Notes from prior ED visits and [place] Controlled Substance Database   Patient was evaluated in Emergency Department today for the symptoms described in the history of present illness. Patient was evaluated in the context of the global COVID-19 pandemic, which necessitated consideration that the patient might be at risk for infection with the SARS-CoV-2 virus that causes COVID-19. Institutional protocols and algorithms that pertain to the evaluation of patients at risk for  COVID-19 are in a state of rapid change based on information released by regulatory bodies including the CDC and federal and state organizations. These policies and algorithms were followed during the patient's care in the ED.   ____________________________________________   FINAL CLINICAL IMPRESSION(S) / ED DIAGNOSES   Final diagnoses:  Atypical chest pain  Gastroesophageal reflux disease, esophagitis presence not specified      NEW MEDICATIONS STARTED DURING THIS VISIT:  ED Discharge Orders         Ordered    omeprazole (PRILOSEC) 40 MG capsule  2 times daily before meals     04/10/19 0107    alum & mag hydroxide-simeth (MAALOX MAX) 400-400-40 MG/5ML suspension  Every 6 hours PRN     04/10/19 0107           Note:  This document was prepared using Dragon voice recognition software and may include unintentional dictation errors.    Don PerkingVeronese, [place], MD 04/10/19 [phone]

## 2019-04-11 LAB — SARS CORONAVIRUS 2 (TAT 6-24 HRS): SARS Coronavirus 2: NEGATIVE

## 2019-04-14 ENCOUNTER — Telehealth: Payer: Self-pay | Admitting: Gastroenterology

## 2019-04-14 NOTE — Telephone Encounter (Signed)
Spoke with pt regarding the Bisacodyl and informed her this medication can be purchased over-the-counter. Pt understands.

## 2019-04-14 NOTE — Telephone Encounter (Signed)
pATIENT CALLED IN & STATES SHE NEED BISACODYL CALLED INTO [facility] ON [address] TODAY FOR HER TO TAKE TONIGHT FOR HER PROCEDURE ON 04-15-19. PLEASE CALL IF ANY QUESTIONS.

## 2019-04-15 ENCOUNTER — Encounter: Payer: Self-pay | Admitting: *Deleted

## 2019-04-15 ENCOUNTER — Ambulatory Visit: Payer: Medicaid Other | Admitting: Anesthesiology

## 2019-04-15 ENCOUNTER — Encounter
Admission: RE | Disposition: A | Discharge status: Home / Self Care | Payer: Self-pay | Source: Ambulatory Visit | Attending: Gastroenterology

## 2019-04-15 ENCOUNTER — Ambulatory Visit
Admission: RE | Admit: 2019-04-15 | Discharge: 2019-04-15 | Disposition: A | Discharge status: Home / Self Care | Payer: Medicaid Other | Source: Ambulatory Visit | Attending: Gastroenterology | Admitting: Gastroenterology

## 2019-04-15 ENCOUNTER — Other Ambulatory Visit: Payer: Self-pay

## 2019-04-15 DIAGNOSIS — K219 Gastro-esophageal reflux disease without esophagitis: Secondary | ICD-10-CM

## 2019-04-15 DIAGNOSIS — R1084 Generalized abdominal pain: Secondary | ICD-10-CM

## 2019-04-15 DIAGNOSIS — E119 Type 2 diabetes mellitus without complications: Secondary | ICD-10-CM | POA: Diagnosis not present

## 2019-04-15 DIAGNOSIS — E669 Obesity, unspecified: Secondary | ICD-10-CM | POA: Diagnosis not present

## 2019-04-15 DIAGNOSIS — K449 Diaphragmatic hernia without obstruction or gangrene: Secondary | ICD-10-CM

## 2019-04-15 DIAGNOSIS — K228 Other specified diseases of esophagus: Secondary | ICD-10-CM

## 2019-04-15 DIAGNOSIS — R109 Unspecified abdominal pain: Secondary | ICD-10-CM | POA: Diagnosis not present

## 2019-04-15 DIAGNOSIS — Z6838 Body mass index (BMI) 38.0-38.9, adult: Secondary | ICD-10-CM | POA: Diagnosis not present

## 2019-04-15 DIAGNOSIS — K222 Esophageal obstruction: Secondary | ICD-10-CM | POA: Diagnosis not present

## 2019-04-15 DIAGNOSIS — K317 Polyp of stomach and duodenum: Secondary | ICD-10-CM | POA: Diagnosis not present

## 2019-04-15 DIAGNOSIS — B3781 Candidal esophagitis: Secondary | ICD-10-CM | POA: Insufficient documentation

## 2019-04-15 DIAGNOSIS — K2950 Unspecified chronic gastritis without bleeding: Secondary | ICD-10-CM | POA: Insufficient documentation

## 2019-04-15 DIAGNOSIS — Z79899 Other long term (current) drug therapy: Secondary | ICD-10-CM | POA: Insufficient documentation

## 2019-04-15 DIAGNOSIS — R1319 Other dysphagia: Secondary | ICD-10-CM

## 2019-04-15 DIAGNOSIS — E039 Hypothyroidism, unspecified: Secondary | ICD-10-CM | POA: Diagnosis not present

## 2019-04-15 DIAGNOSIS — K3189 Other diseases of stomach and duodenum: Secondary | ICD-10-CM

## 2019-04-15 DIAGNOSIS — K2289 Other specified disease of esophagus: Secondary | ICD-10-CM

## 2019-04-15 DIAGNOSIS — E079 Disorder of thyroid, unspecified: Secondary | ICD-10-CM | POA: Insufficient documentation

## 2019-04-15 DIAGNOSIS — G709 Myoneural disorder, unspecified: Secondary | ICD-10-CM | POA: Insufficient documentation

## 2019-04-15 DIAGNOSIS — R1310 Dysphagia, unspecified: Secondary | ICD-10-CM | POA: Diagnosis not present

## 2019-04-15 DIAGNOSIS — Z7689 Persons encountering health services in other specified circumstances: Secondary | ICD-10-CM

## 2019-04-15 DIAGNOSIS — K5730 Diverticulosis of large intestine without perforation or abscess without bleeding: Secondary | ICD-10-CM

## 2019-04-15 DIAGNOSIS — Z1211 Encounter for screening for malignant neoplasm of colon: Secondary | ICD-10-CM | POA: Diagnosis not present

## 2019-04-15 DIAGNOSIS — Z79890 Hormone replacement therapy (postmenopausal): Secondary | ICD-10-CM | POA: Diagnosis not present

## 2019-04-15 DIAGNOSIS — K210 Gastro-esophageal reflux disease with esophagitis: Secondary | ICD-10-CM | POA: Diagnosis not present

## 2019-04-15 DIAGNOSIS — K259 Gastric ulcer, unspecified as acute or chronic, without hemorrhage or perforation: Secondary | ICD-10-CM | POA: Diagnosis not present

## 2019-04-15 DIAGNOSIS — K5790 Diverticulosis of intestine, part unspecified, without perforation or abscess without bleeding: Secondary | ICD-10-CM | POA: Diagnosis not present

## 2019-04-15 HISTORY — PX: COLONOSCOPY WITH PROPOFOL: SHX5780

## 2019-04-15 HISTORY — PX: ESOPHAGOGASTRODUODENOSCOPY (EGD) WITH PROPOFOL: SHX5813

## 2019-04-15 LAB — POCT PREGNANCY, URINE: Preg Test, Ur: NEGATIVE

## 2019-04-15 SURGERY — COLONOSCOPY WITH PROPOFOL
Anesthesia: General

## 2019-04-15 MED ORDER — GLYCOPYRROLATE 0.2 MG/ML IJ SOLN
INTRAMUSCULAR | Status: AC
  Filled 2019-04-15: qty 1

## 2019-04-15 MED ORDER — SODIUM CHLORIDE 0.9 % IV SOLN
INTRAVENOUS | Status: DC
  Administered 2019-04-15: 08:00:00 via INTRAVENOUS

## 2019-04-15 MED ORDER — GLYCOPYRROLATE PF 0.2 MG/ML IJ SOSY
PREFILLED_SYRINGE | INTRAMUSCULAR | Status: DC | PRN
  Administered 2019-04-15: .2 mg via INTRAVENOUS

## 2019-04-15 MED ORDER — LIDOCAINE HCL (PF) 2 % IJ SOLN
INTRAMUSCULAR | Status: AC
  Filled 2019-04-15: qty 10

## 2019-04-15 MED ORDER — PROPOFOL 10 MG/ML IV BOLUS
INTRAVENOUS | Status: AC
  Filled 2019-04-15: qty 20

## 2019-04-15 MED ORDER — PROPOFOL 500 MG/50ML IV EMUL
INTRAVENOUS | Status: DC | PRN
  Administered 2019-04-15: 130 ug/kg/min via INTRAVENOUS

## 2019-04-15 MED ORDER — PROPOFOL 10 MG/ML IV BOLUS
INTRAVENOUS | Status: DC | PRN
  Administered 2019-04-15: 100 mg via INTRAVENOUS
  Administered 2019-04-15: 40 mg via INTRAVENOUS

## 2019-04-15 MED ORDER — PROPOFOL 500 MG/50ML IV EMUL
INTRAVENOUS | Status: AC
  Filled 2019-04-15: qty 50

## 2019-04-15 NOTE — Anesthesia Postprocedure Evaluation (Signed)
Anesthesia Post Note  Patient: Morgan Stevens  Procedure(s) Performed: COLONOSCOPY WITH PROPOFOL (N/A ) ESOPHAGOGASTRODUODENOSCOPY (EGD) WITH PROPOFOL (N/A )  Patient location during evaluation: PACU Anesthesia Type: General Level of consciousness: awake and alert Pain management: pain level controlled Vital Signs Assessment: post-procedure vital signs reviewed and stable Respiratory status: spontaneous breathing, nonlabored ventilation, respiratory function stable and patient connected to nasal cannula oxygen Cardiovascular status: blood pressure returned to baseline and stable Postop Assessment: no apparent nausea or vomiting Anesthetic complications: no     Last Vitals:  Vitals:   04/15/19 0939 04/15/19 0949  BP:  (!) 156/88  Pulse: 74 66  Resp: 16 14  Temp:    SpO2: 100% 100%    Last Pain:  Vitals:   04/15/19 0939  TempSrc:   PainSc: 0-No pain                 Molli Barrows

## 2019-04-15 NOTE — Transfer of Care (Signed)
Immediate Anesthesia Transfer of Care Note  Patient: Aidynn Polendo  Procedure(s) Performed: COLONOSCOPY WITH PROPOFOL (N/A ) ESOPHAGOGASTRODUODENOSCOPY (EGD) WITH PROPOFOL (N/A )  Patient Location: Endoscopy Unit  Anesthesia Type:General  Level of Consciousness: drowsy and patient cooperative  Airway & Oxygen Therapy: Patient Spontanous Breathing  Post-op Assessment: Report given to RN and Post -op Vital signs reviewed and stable  Post vital signs: Reviewed and stable  Last Vitals:  Vitals Value Taken Time  BP 140/87 04/15/19 0909  Temp    Pulse 82 04/15/19 0916  Resp 18 04/15/19 0916  SpO2 100 % 04/15/19 0916  Vitals shown include unvalidated device data.  Last Pain:  Vitals:   04/15/19 0909  TempSrc:   PainSc: 0-No pain         Complications: No apparent anesthesia complications

## 2019-04-15 NOTE — Anesthesia Preprocedure Evaluation (Signed)
Anesthesia Evaluation  Patient identified by MRN, date of birth, ID band Patient awake    Reviewed: Allergy & Precautions, H&P , NPO status , Patient's Chart, lab work & pertinent test results, reviewed documented beta blocker date and time   Airway Mallampati: II   Neck ROM: full    Dental  (+) Teeth Intact   Pulmonary shortness of breath,    Pulmonary exam normal        Cardiovascular Exercise Tolerance: Poor + angina with exertion Normal cardiovascular exam Rhythm:regular Rate:Normal     Neuro/Psych PSYCHIATRIC DISORDERS  Neuromuscular disease    GI/Hepatic negative GI ROS, Neg liver ROS,   Endo/Other  diabetesHypothyroidism   Renal/GU negative Renal ROS  negative genitourinary   Musculoskeletal   Abdominal   Peds  Hematology negative hematology ROS (+)   Anesthesia Other Findings Past Medical History: No date: Gastritis No date: Hiatal hernia No date: Obesity (BMI 30-39.9) No date: Reflux No date: Thyroid disease Past Surgical History: 02/27/2016: CARDIAC CATHETERIZATION     Comment:  Procedure: Left Heart Cath and Coronary Angiography;                Surgeon: Jettie Booze, MD;  Location: [place]               [facility];  Service: Cardiovascular;; No date: CESAREAN SECTION No date: CHOLECYSTECTOMY No date: THYROIDECTOMY No date: THYROIDECTOMY BMI    Body Mass Index: 38.62 kg/m     Reproductive/Obstetrics negative OB ROS                             Anesthesia Physical Anesthesia Plan  ASA: III  Anesthesia Plan: General   Post-op Pain Management:    Induction:   PONV Risk Score and Plan:   Airway Management Planned:   Additional Equipment:   Intra-op Plan:   Post-operative Plan:   Informed Consent: I have reviewed the patients History and Physical, chart, labs and discussed the procedure including the risks, benefits and alternatives for the proposed  anesthesia with the patient or authorized representative who has indicated his/her understanding and acceptance.     Dental Advisory Given  Plan Discussed with: CRNA  Anesthesia Plan Comments:         Anesthesia Quick Evaluation

## 2019-04-15 NOTE — Op Note (Signed)
[facility] Gastroenterology Patient Name: Morgan CoventryRuth Stevens Procedure Date: 04/15/2019 7:56 AM MRN: [id number] Account #: [id number] Date of Birth: 1965/12/17 Admit Type: Outpatient Age: [age] Room[facility] END[facility]OM 3 Gender: Female Note Status: Finalized Procedure:            Upper GI endoscopy Indications:          Dysphagia Providers:            Audrena Talaga B. Maximino Greenlandahiliani MD, MD Referring MD:         [place] Christianson Medicines:            Monitored Anesthesia Care Complications:        No immediate complications. Procedure:            Pre-Anesthesia Assessment:                       - Prior to the procedure, a History and Physical was                        performed, and patient medications, allergies and                        sensitivities were reviewed. The patient's tolerance of                        previous anesthesia was reviewed.                       - The risks and benefits of the procedure and the                        sedation options and risks were discussed with the                        patient. All questions were answered and informed                        consent was obtained.                       - Patient identification and proposed procedure were                        verified prior to the procedure by the physician, the                        nurse, the anesthesiologist, the anesthetist and the                        technician. The procedure was verified in the procedure                        room.                       - ASA Grade Assessment: II - A patient with mild                        systemic disease.                       After obtaining informed consent, the  endoscope was                        passed under direct vision. Throughout the procedure,                        the patient's blood pressure, pulse, and oxygen                        saturations were monitored continuously. The Endoscope                        was introduced  through the mouth, and advanced to the                        third part of duodenum. The upper GI endoscopy was                        accomplished with ease. The patient tolerated the                        procedure well. Findings:      A widely patent Schatzki ring was found at the gastroesophageal junction.      Mucosal changes including ringed esophagus were found in the mid       esophagus and in the distal esophagus. Biopsies were obtained from the       proximal and distal esophagus with cold forceps for histology of       suspected eosinophilic esophagitis.      A medium-sized hiatal hernia was present.      Three, 4 to 7 mm non-bleeding erosions were found in the gastric antrum.       There were no stigmata of recent bleeding. Biopsies were taken with a       cold forceps for histology.      4-5 3 to 4 mm sessile polyps with no bleeding and no stigmata of recent       bleeding were found in the gastric body.      Patchy mildly erythematous mucosa without bleeding was found in the       gastric antrum. Biopsies were taken with a cold forceps for histology.       Biopsies were obtained in the gastric body, at the incisura and in the       gastric antrum with cold forceps for histology.      The duodenal bulb, second portion of the duodenum, third portion of the       duodenum and examined duodenum were normal. Impression:           - Widely patent Schatzki ring.                       - Esophageal mucosal changes suggestive of eosinophilic                        esophagitis. Biopsied.                       - Medium-sized hiatal hernia.                       - Non-bleeding erosive gastropathy. Biopsied.                       -  4-5 gastric polyps.                       - Erythematous mucosa in the antrum. Biopsied.                       - Normal duodenal bulb, second portion of the duodenum,                        third portion of the duodenum and examined duodenum.                        - Biopsies were obtained in the gastric body, at the                        incisura and in the gastric antrum. Recommendation:       - Await pathology results.                       - Discharge patient to home (with escort).                       - Advance diet as tolerated.                       - Continue present medications.                       - Patient has a contact number available for                        emergencies. The signs and symptoms of potential                        delayed complications were discussed with the patient.                        Return to normal activities tomorrow. Written discharge                        instructions were provided to the patient.                       - Discharge patient to home (with escort).                       - The findings and recommendations were discussed with                        the patient.                       - The findings and recommendations were discussed with                        the patient's family.                       - Follow an antireflux regimen. Procedure Code(s):    --- Professional ---                       [id number], Esophagogastroduodenoscopy, flexible, transoral;  with biopsy, single or multiple Diagnosis Code(s):    --- Professional ---                       K22.2, Esophageal obstruction                       K22.8, Other specified diseases of esophagus                       K44.9, Diaphragmatic hernia without obstruction or                        gangrene                       K31.89, Other diseases of stomach and duodenum                       K31.7, Polyp of stomach and duodenum                       R13.10, Dysphagia, unspecified CPT copyright 2019 American Medical Association. All rights reserved. The codes documented in this report are preliminary and upon coder review may  be revised to meet current compliance requirements.  Melodie BouillonVarnita Selita Staiger, MD Michel BickersVarnita B.  Maximino Greenlandahiliani MD, MD 04/15/2019 9:15:21 AM This report has been signed electronically. Number of Addenda: 0 Note Initiated On: 04/15/2019 7:56 AM Total Procedure Duration: 0 hours 15 minutes 8 seconds  Estimated Blood Loss: Estimated blood loss: none.      [facility]

## 2019-04-15 NOTE — Anesthesia Post-op Follow-up Note (Signed)
Anesthesia QCDR form completed.        

## 2019-04-15 NOTE — Op Note (Signed)
[facility] Gastroenterology Patient Name: Huel CoventryRuth Staron Procedure Date: 04/15/2019 7:54 AM MRN: [id number] Account #: [id number] Date of Birth: 07-Jan-1966 Admit Type: Outpatient Age: [age] Room[facility] 3 Gender: Female Note Status: Finalized Procedure:            Colonoscopy Indications:          Screening for colorectal malignant neoplasm Providers:            Alesia Oshields B. Maximino Greenlandahiliani MD, MD Referring MD:         Sallye LatNo Local Md, MD (Referring MD) Medicines:            Monitored Anesthesia Care Complications:        No immediate complications. Procedure:            Pre-Anesthesia Assessment:                       - Prior to the procedure, a History and Physical was                        performed, and patient medications, allergies and                        sensitivities were reviewed. The patient's tolerance of                        previous anesthesia was reviewed.                       - The risks and benefits of the procedure and the                        sedation options and risks were discussed with the                        patient. All questions were answered and informed                        consent was obtained.                       - Patient identification and proposed procedure were                        verified prior to the procedure by the physician, the                        nurse, the anesthetist and the technician. The                        procedure was verified in the pre-procedure area in the                        procedure room in the endoscopy suite.                       - ASA Grade Assessment: II - A patient with mild                        systemic disease.                       -  After reviewing the risks and benefits, the patient                        was deemed in satisfactory condition to undergo the                        procedure.                       After obtaining informed consent, the colonoscope was            passed under direct vision. Throughout the procedure,                        the patient's blood pressure, pulse, and oxygen                        saturations were monitored continuously. The                        Colonoscope was introduced through the anus and                        advanced to the the cecum, identified by appendiceal                        orifice and ileocecal valve. The colonoscopy was                        performed with ease. The patient tolerated the                        procedure well. The quality of the bowel preparation                        was good. Findings:      The perianal and digital rectal examinations were normal.      A few diverticula were found in the sigmoid colon and transverse colon.      The exam was otherwise without abnormality.      The rectum, sigmoid colon, descending colon, transverse colon, ascending       colon and cecum appeared normal.      Retroflexion in the rectum was not performed due to Narrow rectum.       Careful frontal view of the rectum was otherwise normal. Impression:           - Diverticulosis in the sigmoid colon and in the                        transverse colon.                       - The examination was otherwise normal.                       - The rectum, sigmoid colon, descending colon,                        transverse colon, ascending colon and cecum are normal.                       - No  specimens collected. Recommendation:       - Discharge patient to home.                       - Resume previous diet.                       - Continue present medications.                       - Repeat colonoscopy in 5 years.                       - Return to primary care physician as previously                        scheduled.                       - The findings and recommendations were discussed with                        the patient.                       - The findings and recommendations were discussed  with                        the patient's family.                       - High fiber diet. Procedure Code(s):    --- Professional ---                       [phone], Colonoscopy, flexible; diagnostic, including                        collection of specimen(s) by brushing or washing, when                        performed (separate procedure) Diagnosis Code(s):    --- Professional ---                       Z12.11, Encounter for screening for malignant neoplasm                        of colon                       K57.30, Diverticulosis of large intestine without                        perforation or abscess without bleeding CPT copyright 2019 American Medical Association. All rights reserved. The codes documented in this report are preliminary and upon coder review may  be revised to meet current compliance requirements.  [name], MD Margretta Sidle B. Bonna Gains MD, MD 04/15/2019 9:17:20 AM This report has been signed electronically. Number of Addenda: 0 Note Initiated On: 04/15/2019 7:54 AM Scope Withdrawal Time: 0 hours 16 minutes 25 seconds  Total Procedure Duration: 0 hours 24 minutes 24 seconds       [facility]

## 2019-04-15 NOTE — H&P (Signed)
Morgan BouillonVarnita Askari Kinley, MD [address], [address], [place], [place], [address] [address], [address], [place], [place], [address] Phone: [phone]  Fax: [phone]  Primary Care Physician:  Christiansen, [place] Nicole, PA-C   Pre-Procedure History & Physical: HPI:  Morgan Stevens is a 53 y.o. female is here for a colonoscopy and EGD.   Past Medical History:  Diagnosis Date  . Gastritis   . Hiatal hernia   . Obesity (BMI 30-39.9)   . Reflux   . Thyroid disease     Past Surgical History:  Procedure Laterality Date  . CARDIAC CATHETERIZATION  02/27/2016   Procedure: Left Heart Cath and Coronary Angiography;  Surgeon: Corky CraftsJayadeep S Varanasi, MD;  [facility];  Service: Cardiovascular;;  . CESAREAN SECTION    . CHOLECYSTECTOMY    . THYROIDECTOMY    . THYROIDECTOMY      Prior to Admission medications   Medication Sig Start Date End Date Taking? Authorizing Provider  cyclobenzaprine (FLEXERIL) 10 MG tablet Take 1 tablet (10 mg total) by mouth 3 (three) times daily as needed for muscle spasms. 01/13/19  Yes Sharyn CreamerQuale, Mark, MD  levothyroxine (SYNTHROID, LEVOTHROID) 137 MCG tablet Take 137 mcg by mouth daily before breakfast.   Yes [provider]  omeprazole (PRILOSEC) 40 MG capsule Take 1 capsule (40 mg total) by mouth 2 (two) times daily before a meal. 04/10/19  Yes Don PerkingVeronese, [place], MD  alum & mag hydroxide-simeth (MAALOX MAX) 400-400-40 MG/5ML suspension Take 5 mLs by mouth every 6 (six) hours as needed for indigestion. Patient not taking: Reported on 04/15/2019 04/10/19   Nita SickleVeronese, [place], MD  gabapentin (NEURONTIN) 300 MG capsule Take 1 capsule (300 mg total) by mouth 3 (three) times daily. Patient not taking: Reported on 04/02/2019 03/16/17   Loleta RoseForbach, Cory, MD  ondansetron (ZOFRAN ODT) 4 MG disintegrating tablet Take 1 tablet (4 mg total) by mouth every 8 (eight) hours as needed. Patient not taking: Reported on 04/15/2019 04/10/19   Nita SickleVeronese, , MD   ranitidine (ZANTAC) 150 MG tablet Take 1 tablet (150 mg total) by mouth 2 (two) times daily. Patient not taking: Reported on 02/13/2019 07/28/16 02/13/19  Willy Eddyobinson, Patrick, MD    Allergies as of 04/06/2019  . (No Known Allergies)    Family History  Problem Relation Age of Onset  . Breast cancer Neg Hx     Social History   Socioeconomic History  . Marital status: Married    Spouse name: Not on file  . Number of children: Not on file  . Years of education: Not on file  . Highest education level: Not on file  Occupational History  . Occupation: home maker  Social Needs  . Financial resource strain: Not on file  . Food insecurity    Worry: Not on file    Inability: Not on file  . Transportation needs    Medical: Not on file    Non-medical: Not on file  Tobacco Use  . Smoking status: Never Smoker  . Smokeless tobacco: Never Used  Substance and Sexual Activity  . Alcohol use: No    Alcohol/week: 0.0 standard drinks  . Drug use: No  . Sexual activity: Not on file  Lifestyle  . Physical activity    Days per week: Not on file    Minutes per session: Not on file  . Stress: Not on file  Relationships  . Social Musicianconnections    Talks on phone: Not on file    Gets together: Not  on file    Attends religious service: Not on file    Active member of club or organization: Not on file    Attends meetings of clubs or organizations: Not on file    Relationship status: Not on file  . Intimate partner violence    Fear of current or ex partner: Not on file    Emotionally abused: Not on file    Physically abused: Not on file    Forced sexual activity: Not on file  Other Topics Concern  . Not on file  Social History Narrative   Home maker, 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, husband is a window washer    Review of Systems: See HPI, otherwise negative ROS  Physical Exam: BP (!) 172/84   Pulse 72   Temp (!) 96.9 F (36.1 C) (Tympanic)   Resp 18   Ht 5\' 4"  (1.626 m)   Wt 102.1 kg    SpO2 100%   BMI 38.62 kg/m  General:   Alert,  pleasant and cooperative in NAD Head:  Normocephalic and atraumatic. Neck:  Supple; no masses or thyromegaly. Lungs:  Clear throughout to auscultation, normal respiratory effort.    Heart:  +S1, +S2, Regular rate and rhythm, No edema. Abdomen:  Soft, nontender and nondistended. Normal bowel sounds, without guarding, and without rebound.   Neurologic:  Alert and  oriented x4;  grossly normal neurologically.  Impression/Plan: Morgan Stevens is here for a colonoscopy to be performed for average risk screening and EGD for dysphagia  Risks, benefits, limitations, and alternatives regarding the procedures have been reviewed with the patient.  Questions have been answered.  All parties agreeable.   Virgel Manifold, MD  04/15/2019, 8:15 AM

## 2019-04-16 ENCOUNTER — Encounter: Payer: Self-pay | Admitting: Gastroenterology

## 2019-04-17 LAB — SURGICAL PATHOLOGY

## 2019-04-20 ENCOUNTER — Telehealth: Payer: Self-pay | Admitting: Gastroenterology

## 2019-04-20 ENCOUNTER — Ambulatory Visit: Payer: Medicaid Other

## 2019-04-20 ENCOUNTER — Other Ambulatory Visit: Payer: Self-pay

## 2019-04-20 DIAGNOSIS — M5412 Radiculopathy, cervical region: Secondary | ICD-10-CM | POA: Diagnosis not present

## 2019-04-20 DIAGNOSIS — M5440 Lumbago with sciatica, unspecified side: Secondary | ICD-10-CM

## 2019-04-20 NOTE — Telephone Encounter (Signed)
Left vm to offer 4-6 week f/u  With Dr. Bonna Gains per Debbie's note

## 2019-04-20 NOTE — Therapy (Signed)
[facility] [address], [place], [address] Phone: [phone]   Fax:  [phone]  Physical Therapy Treatment  Patient Details  Name: Morgan Stevens MRN: [id number] Date of Birth: 1966/07/01 Referring Provider (PT): Christiansen, [place]   Encounter Date: 04/20/2019  PT End of Session - 04/20/19 1002    Visit Number  4    Number of Visits  13    Date for PT Re-Evaluation  04/21/19    Authorization Type  [employer]    PT Start Time  0907    PT Stop Time  0945    PT Time Calculation (min)  38 min    Activity Tolerance  Patient tolerated treatment well;No increased pain    Behavior During Therapy  WFL for tasks assessed/performed       Past Medical History:  Diagnosis Date   Gastritis    Hiatal hernia    Obesity (BMI 30-39.9)    Reflux    Thyroid disease     Past Surgical History:  Procedure Laterality Date   CARDIAC CATHETERIZATION  02/27/2016   Procedure: Left Heart Cath and Coronary Angiography;  Surgeon: Jettie Booze, MD;  Location: [place] CV LAB;  Service: Cardiovascular;;   CESAREAN SECTION     CHOLECYSTECTOMY     COLONOSCOPY WITH PROPOFOL N/A 04/15/2019   Procedure: COLONOSCOPY WITH PROPOFOL;  Surgeon: Virgel Manifold, MD;  Location: [facility] ENDOSCOPY;  Service: Endoscopy;  Laterality: N/A;   ESOPHAGOGASTRODUODENOSCOPY (EGD) WITH PROPOFOL N/A 04/15/2019   Procedure: ESOPHAGOGASTRODUODENOSCOPY (EGD) WITH PROPOFOL;  Surgeon: Virgel Manifold, MD;  Location: [facility] ENDOSCOPY;  Service: Endoscopy;  Laterality: N/A;   THYROIDECTOMY     THYROIDECTOMY      There were no vitals filed for this visit.  Subjective Assessment - 04/20/19 0923    Subjective  Pt reports a current 0/10 pain, citing improved pain response 2/2 flexeril. Pt has been inconsistent with her HEP but has been active the past couple of weeks.    Currently in Pain?  No/denies    Pain Score  0-No pain        TREATMENT   Therapeutic Exercise to improve mobility, strength,  and reduce pain.  Prone Press ups 2x5 Prone on Elbows x 12mn Standing side glides with UE supported, bilaterally x15 Standing Lumbar rotation with PVC pipe x15 Standing Lumbar Ext with towel assist for OP 2x15 Shoulder circles (clockwise, counter-clockwise) x10 each Standing no moneys with GTB 2x10  Standing rows with GTB 2x10 Rotational Chops 4# 2x10       At the end of the session, pt reported no sx increase.       PT Education - 04/20/19 0959    Education Details  Pt educated on the technique/form. Progressed rows and no moneys to GTB (from RTB).    Person(s) Educated  Patient    Methods  Explanation;Demonstration;Tactile cues;Verbal cues    Comprehension  Verbalized understanding;Returned demonstration          PT Long Term Goals - 04/20/19 1044      PT LONG TERM GOAL #1   Title  Patient will be independent with HEP to continue benefits of therapy after discharge    Baseline  Dependent with form/technique; 04/20/2019 Inconsistent with HEP compliance.    Time  6    Period  Weeks    Status  Partially Met    Target Date  06/01/19      PT LONG TERM GOAL #  2   Title  Patient will have a worst pain of 2/10 in the cervical and 2/10 in the lumbar spine to better be able to perform greater amount of standing and use of her UE with less pain.    Baseline  cervical: 8/10; lumbar 7/10 worst pain    Time  6    Period  Weeks    Status  Deferred      PT LONG TERM GOAL #3   Title  Patient will be able to drive in the car or sit for > 2 hours without increase in pain to better be able to travel with less.    Baseline  drive 1 hour with less pain    Time  6    Period  Weeks    Status  Deferred      PT LONG TERM GOAL #4   Title  Patient will have no radiating symptoms down her UE or LE B to better be able to perform exercises in standing.    Baseline  Radiating pain down LE and UE B    Time  6     Period  Weeks    Status  Deferred            Plan - 04/20/19 1004    Clinical Impression Statement  Pt presents with reported resolution of sx within the past week, citing benefit of taking Flexiril for her TMD/joint pain. Pt has been inconsistent with performing her HEP due to being busy with chores/family, and she was experiencing general sx the week before, so she will benefit from a re-evaluation of long-term goals progress at a later date. For todays session, pt continues to tolerate active standing lumbar ROM exercises, prone exercises, as well as thoracic spine and scapular strengthening. Due to pts improved pain response 2/2 medication, pt able to perform increased volume and intensity of exercise without exacerbation. At the end of the session, pt reported moderate muscular soreness. Pt continues to present with general hypomobility through cervical and lumbar region, increased pain response, and deficits in strength and activity tolerance. Pt will continue to benefit from skilled therapy treatment to return to prior level of function.    Personal Factors and Comorbidities  Age;Behavior Pattern;Comorbidity 2;Comorbidity 3+    Comorbidities  thyroidectomy, chronic pain, hernia    Examination-Activity Limitations  Bend;Carry;Sit;Lift;Transfers;Squat    Examination-Participation Restrictions  Community Activity;Driving    Stability/Clinical Decision Making  Evolving/Moderate complexity    Rehab Potential  Fair    PT Frequency  2x / week    PT Duration  6 weeks    PT Treatment/Interventions  Manual techniques;Patient/family education;Electrical Stimulation;Cryotherapy;Aquatic Therapy;Moist Heat;Traction;Stair training;Gait training;Therapeutic activities;Therapeutic exercise;Neuromuscular re-education;Dry needling;Spinal Manipulations;Joint Manipulations    PT Next Visit Plan  Progress AROM    PT Home Exercise Plan  See education section    Consulted and Agree with Plan of Care   Patient       Patient will benefit from skilled therapeutic intervention in order to improve the following deficits and impairments:  Decreased activity tolerance, Decreased coordination, Decreased strength, Hypermobility, Pain, Postural dysfunction, Improper body mechanics, Decreased range of motion, Decreased balance, Difficulty walking, Increased muscle spasms  Visit Diagnosis: Radiculopathy, cervical region  Low back pain with sciatica, sciatica laterality unspecified, unspecified back pain laterality, unspecified chronicity     Problem List Patient Active Problem List   Diagnosis Date Noted   Stricture and stenosis of esophagus    Columnar epithelial-lined lower esophagus  Stomach irritation    Gastric polyp    Esophageal dysphagia    Encounter for screening colonoscopy    Diverticulosis of large intestine without diverticulitis    Conversion disorder 06/11/2018   Esophagitis, [place] grade A 09/06/2017   Hiatal hernia 09/06/2017   Schatzki's ring of distal esophagus 09/06/2017   Other constipation 07/05/2017   Sciatica 03/27/2017   Pain in the chest    Abnormal cardiac function test 02/26/2016   Obesity (BMI 30-39.9)    Angina pectoris ([place]) 02/25/2016   Dyspnea 02/25/2016   Hypothyroidism 02/25/2016   Postoperative hypothyroidism 02/25/2016   Chest pain 09/07/2015   GERD (gastroesophageal reflux disease) 01/21/2014   Gestational diabetes 01/21/2014   Tonsillar hypertrophy 01/21/2014   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, [place], [address] Phone: [phone]   Fax:  [phone]  Name: Morgan Stevens MRN: [id number] Date of Birth: 1966/06/01

## 2019-04-27 ENCOUNTER — Telehealth: Payer: Self-pay

## 2019-04-27 ENCOUNTER — Other Ambulatory Visit: Payer: Self-pay | Admitting: Gastroenterology

## 2019-04-27 MED ORDER — FLUCONAZOLE 200 MG PO TABS: ORAL_TABLET | ORAL | 0 refills | Status: AC

## 2019-04-27 NOTE — Telephone Encounter (Signed)
Patient verbalized understanding and will go pick up prescription at the pharmacy.

## 2019-04-27 NOTE — Telephone Encounter (Signed)
Called and left a message for call back  

## 2019-04-27 NOTE — Telephone Encounter (Signed)
-----   Message from Virgel Manifold, MD sent at 04/27/2019 11:37 AM EDT ----- Please let the pt know her biopsies showed yeast infection in her esophagus. I have sent antifungal to her pharmacy

## 2019-04-27 NOTE — Telephone Encounter (Signed)
Talk to patient right after 1pm

## 2019-04-27 NOTE — Telephone Encounter (Signed)
Pt left vm returning Ashley's call 

## 2019-04-28 ENCOUNTER — Ambulatory Visit: Payer: Medicaid Other

## 2019-04-30 ENCOUNTER — Ambulatory Visit: Payer: Medicaid Other

## 2019-06-02 DIAGNOSIS — Z23 Encounter for immunization: Secondary | ICD-10-CM | POA: Diagnosis not present

## 2019-06-02 DIAGNOSIS — M940 Chondrocostal junction syndrome [Tietze]: Secondary | ICD-10-CM | POA: Diagnosis not present

## 2019-06-02 DIAGNOSIS — K219 Gastro-esophageal reflux disease without esophagitis: Secondary | ICD-10-CM | POA: Diagnosis not present

## 2019-06-08 ENCOUNTER — Other Ambulatory Visit: Payer: Self-pay

## 2019-06-10 ENCOUNTER — Other Ambulatory Visit: Payer: Self-pay

## 2019-06-10 ENCOUNTER — Ambulatory Visit (INDEPENDENT_AMBULATORY_CARE_PROVIDER_SITE_OTHER): Payer: Medicaid Other | Admitting: Gastroenterology

## 2019-06-10 ENCOUNTER — Encounter: Payer: Self-pay | Admitting: Gastroenterology

## 2019-06-10 VITALS — BP 145/90 | HR 73 | Temp 98.2°F | Wt 229.0 lb

## 2019-06-10 DIAGNOSIS — R1310 Dysphagia, unspecified: Secondary | ICD-10-CM | POA: Diagnosis not present

## 2019-06-10 NOTE — Progress Notes (Signed)
[name], MD [address]  [place]  [place], [place] [address]  Main: [phone]  Fax: [phone]   Primary Care Physician: Christiansen, [place] Nicole, PA-C   Chief complaint: Dysphagia, resolved  HPI: Morgan Stevens is a 53 y.o. female here for follow-up of dysphagia that has now resolved.  Patient reports doing well.  Denies any abdominal pain.  Underwent an EGD in August 2020 Widely patent Schatzki ring. - Esophageal mucosal changes suggestive of eosinophilic esophagitis. Biopsied. - Medium-sized hiatal hernia. - Non-bleeding erosive gastropathy. Biopsied. - 4-5 gastric polyps. - Erythematous mucosa in the antrum. Biopsied. - Normal duodenal bulb, second portion of the duodenum, third portion of the duodenum and examined duodenum. - Biopsies were obtained in the gastric body, at the incisura and in the gastric antrum.  Surgical Pathology  CASE: [id number]  PATIENT: Morgan Stevens  Surgical Pathology Report      SPECIMEN SUBMITTED:  A. Stomach, erosion, h pylori; cbx  B. Stomach polyp; cbx  C. Esophagus, distal, r/o EOE; cbx  D. Esophagus, proximal, r/o EOE; cbx   CLINICAL HISTORY:  None provided   PRE-OPERATIVE DIAGNOSIS:  Screening colonoscopy, GERD, abdominal pain   POST-OPERATIVE DIAGNOSIS:  Gastric polyp and erosions, diverticulosis      DIAGNOSIS:  A. STOMACH, EROSION; COLD BIOPSY:  - CHRONIC AND FOCALLY ACTIVE GASTRITIS WITH SURFACE EROSION.  - NEGATIVE FOR HELICOBACTER PYLORI  - NEGATIVE FOR INTESTINAL METAPLASIA, DYSPLASIA, AND MALIGNANCY.   B. STOMACH POLYP; COLD BIOPSY:  - FUNDIC GLAND POLYP.  - NEGATIVE FOR DYSPLASIA AND MALIGNANCY.   C. ESOPHAGUS, DISTAL; COLD BIOPSY:  - FEATURES SUGGESTIVE OF REFLUX ESOPHAGITIS.  - NEGATIVE FOR INCREASED EOSINOPHILS (< 5 PER HIGH-POWER FIELD).  - NEGATIVE FOR INTESTINAL METAPLASIA, DYSPLASIA, AND MALIGNANCY.   D. ESOPHAGUS, PROXIMAL; COLD BIOPSY:  - FUNGAL ESOPHAGITIS,  MORPHOLOGICALLY CONSISTENT WITH CANDIDA.  - NEGATIVE FOR INCREASED EOSINOPHILS (<1 PER HIGH-POWER FIELD).  - NEGATIVE FOR INTESTINAL METAPLASIA, DYSPLASIA, AND MALIGNANCY.   Comment:  Immunohistochemical stain for H. pylori (block A1) is negative.  Was on omeprazole which has helped her abdominal pain.  This was switched to Pepcid twice daily recently and this is helping as well.  Colonoscopy was normal and repeat recommended in 5 years  Previously had an EGD at [facility], January 2019 which showed a wide-open Schatzki's ring, grade a esophagitis.  Tortuous esophagus.  2 cm hiatal hernia.  Few gastric polyps.  Pathology showed fundic gland polyps, no intestinal metaplasia on stomach biopsies.  Chronic active gastritis.  Negative H. pylori.  GE junction biopsies with squamocolumnar junction with reflux related changes, no intestinal metaplasia.  Esophageal biopsies with no increase in intraepithelial eosinophils.  Current Outpatient Medications  Medication Sig Dispense Refill  . cyclobenzaprine (FLEXERIL) 10 MG tablet Take 1 tablet (10 mg total) by mouth 3 (three) times daily as needed for muscle spasms. 30 tablet 0  . famotidine (PEPCID) 40 MG tablet     . levothyroxine (SYNTHROID, LEVOTHROID) 137 MCG tablet Take 137 mcg by mouth daily before breakfast.     No current facility-administered medications for this visit.     Allergies as of 06/10/2019  . (No Known Allergies)    ROS:  General: Negative for anorexia, weight loss, fever, chills, fatigue, weakness. ENT: Negative for hoarseness, difficulty swallowing , nasal congestion. CV: Negative for chest pain, angina, palpitations, dyspnea on exertion, peripheral edema.  Respiratory: Negative for dyspnea at rest, dyspnea on exertion, cough, sputum, wheezing.  GI: See history of present illness.  GU:  Negative for dysuria, hematuria, urinary incontinence, urinary frequency, nocturnal urination.  Endo: Negative for unusual weight change.     Physical Examination:   BP (!) 145/90 (BP Location: Left Arm, Patient Position: Sitting, Cuff Size: Large)   Pulse 73   Temp 98.2 F (36.8 C) (Oral)   Wt 229 lb (103.9 kg)   BMI 39.31 kg/m   General: Well-nourished, well-developed in no acute distress.  Eyes: No icterus. Conjunctivae pink. Mouth: Oropharyngeal mucosa moist and pink , no lesions erythema or exudate. Neck: Supple, Trachea midline Abdomen: Bowel sounds are normal, nontender, nondistended, no hepatosplenomegaly or masses, no abdominal bruits or hernia , no rebound or guarding.   Extremities: No lower extremity edema. No clubbing or deformities. Neuro: Alert and oriented x 3.  Grossly intact. Skin: Warm and dry, no jaundice.   Psych: Alert and cooperative, normal mood and affect.   Labs: CMP     Component Value Date/Time   NA 138 04/09/2019 2111   NA 139 08/02/2014 1624   K 3.3 (L) 04/09/2019 2111   K 3.9 08/02/2014 1624   CL 105 04/09/2019 2111   CL 103 08/02/2014 1624   CO2 24 04/09/2019 2111   CO2 27 08/02/2014 1624   GLUCOSE 115 (H) 04/09/2019 2111   GLUCOSE 113 (H) 08/02/2014 1624   BUN 10 04/09/2019 2111   BUN 10 08/02/2014 1624   CREATININE 0.75 04/09/2019 2111   CREATININE 0.85 08/02/2014 1624   CALCIUM 9.3 04/09/2019 2111   CALCIUM 8.6 08/02/2014 1624   PROT 6.4 (L) 02/13/2019 1543   PROT 7.3 08/02/2014 1624   ALBUMIN 3.6 02/13/2019 1543   ALBUMIN 3.6 08/02/2014 1624   AST 20 02/13/2019 1543   AST 22 08/02/2014 1624   ALT 26 02/13/2019 1543   ALT 26 08/02/2014 1624   ALKPHOS 29 (L) 02/13/2019 1543   ALKPHOS 40 (L) 08/02/2014 1624   BILITOT 0.7 02/13/2019 1543   BILITOT 0.4 08/02/2014 1624   GFRNONAA >60 04/09/2019 2111   GFRNONAA >60 08/02/2014 1624   GFRNONAA >60 05/06/2014 2154   GFRAA >60 04/09/2019 2111   GFRAA >60 08/02/2014 1624   GFRAA >60 05/06/2014 2154   Lab Results  Component Value Date   WBC 6.2 04/09/2019   HGB 12.5 04/09/2019   HCT 35.7 (L) 04/09/2019   MCV 85.8  04/09/2019   PLT 279 04/09/2019    Imaging Studies: No results found.  Assessment and Plan:   Morgan Stevens is a 53 y.o. y/o female for dysphagia and abdominal pain that has resolved with antifungal treatment for Candida, previously on PPI, now on H2 RA  Symptoms resolved Continue diet changes and lifestyle modifications to prevent reflux  Continue H2 RA therapy as needed If symptoms return patient to contact us and she verbalized understanding  Follow-up with primary care doctor Follow-up with [place] as needed  Dr Melodie Bouillon

## 2019-06-12 DIAGNOSIS — Z23 Encounter for immunization: Secondary | ICD-10-CM | POA: Diagnosis not present

## 2019-06-12 DIAGNOSIS — R6884 Jaw pain: Secondary | ICD-10-CM | POA: Diagnosis not present

## 2019-06-15 ENCOUNTER — Other Ambulatory Visit: Payer: Self-pay

## 2019-06-15 ENCOUNTER — Emergency Department: Payer: Medicaid Other

## 2019-06-15 ENCOUNTER — Encounter: Payer: Self-pay | Admitting: Intensive Care

## 2019-06-15 ENCOUNTER — Emergency Department
Admission: EM | Admit: 2019-06-15 | Discharge: 2019-06-15 | Disposition: A | Discharge status: Home / Self Care | Payer: Medicaid Other | Attending: Emergency Medicine | Admitting: Emergency Medicine

## 2019-06-15 DIAGNOSIS — M778 Other enthesopathies, not elsewhere classified: Secondary | ICD-10-CM | POA: Diagnosis not present

## 2019-06-15 DIAGNOSIS — M7522 Bicipital tendinitis, left shoulder: Secondary | ICD-10-CM | POA: Diagnosis not present

## 2019-06-15 DIAGNOSIS — Z79899 Other long term (current) drug therapy: Secondary | ICD-10-CM | POA: Insufficient documentation

## 2019-06-15 DIAGNOSIS — M25512 Pain in left shoulder: Secondary | ICD-10-CM

## 2019-06-15 DIAGNOSIS — E039 Hypothyroidism, unspecified: Secondary | ICD-10-CM | POA: Diagnosis not present

## 2019-06-15 MED ORDER — PREDNISONE 20 MG PO TABS: 20.0000 mg | ORAL_TABLET | Freq: Two times a day (BID) | ORAL | 0 refills | Status: AC

## 2019-06-15 MED ORDER — KETOROLAC TROMETHAMINE 30 MG/ML IJ SOLN
30.0000 mg | Freq: Once | INTRAMUSCULAR | Status: AC
  Administered 2019-06-15: 30 mg via INTRAMUSCULAR
  Filled 2019-06-15: qty 1

## 2019-06-15 NOTE — ED Notes (Signed)
See triage note  Presents with left shoulder and arm pain  States pain started this am  Denies any injury  No deformity noted

## 2019-06-15 NOTE — Discharge Instructions (Signed)
Your exam and XR are consistent with shoulder tendinitis. Take the prescription steroid and muscle relaxant as directed. Follow-up with Orthopedics for further management.

## 2019-06-15 NOTE — ED Provider Notes (Signed)
[facility] Emergency Department Provider Note ____________________________________________  Time seen: 1721  I have reviewed the triage vital signs and the nursing notes.  HISTORY  Chief Complaint  Shoulder Pain (Left) and Arm Pain (Left)  HPI Morgan Stevens is a 53 y.o. female presents to the ED with complaints of left upper extremity paresthesias for 6 to 8 weeks.  Patient is currently taking cyclobenzaprine, on the prescription of her primary care provider.  She saw the primary care initially about 3 weeks ago for ongoing shoulder pain.  She has not returned for subsequent visit, and presents now to the ED for evaluation and management peer patient denies any injury, trauma, or history of ongoing chronic shoulder pain.  She denies any grip changes but does report distal paresthesias to the from the dorsal lateral shoulder to the hand.  She also reports decreased range of motion with any work over shoulder level.  She denies any chest pain, shortness of breath, or syncope.   Past Medical History:  Diagnosis Date  . Gastritis   . Hiatal hernia   . Obesity (BMI 30-39.9)   . Reflux   . Thyroid disease     Patient Active Problem List   Diagnosis Date Noted  . Stricture and stenosis of esophagus   . Columnar epithelial-lined lower esophagus   . Stomach irritation   . Gastric polyp   . Esophageal dysphagia   . Encounter for screening colonoscopy   . Diverticulosis of large intestine without diverticulitis   . Conversion disorder 06/11/2018  . Esophagitis, [place] grade A 09/06/2017  . Hiatal hernia 09/06/2017  . Schatzki's ring of distal esophagus 09/06/2017  . Other constipation 07/05/2017  . Sciatica 03/27/2017  . Pain in the chest   . Abnormal cardiac function test 02/26/2016  . Obesity (BMI 30-39.9)   . Angina pectoris (HCC) 02/25/2016  . Dyspnea 02/25/2016  . Hypothyroidism 02/25/2016  . 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 Chest pain  09/07/2015  . GERD (gastroesophageal reflux disease) 01/21/2014  . Gestational diabetes 01/21/2014  . Tonsillar hypertrophy 01/21/2014  . 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 CARDIAC CATHETERIZATION  02/27/2016   Procedure: Left Heart Cath and Coronary Angiography;  Surgeon: Corky Crafts, MD;  Location: [facility] INVASIVE CV LAB;  Service: Cardiovascular;;  . CESAREAN SECTION    . CHOLECYSTECTOMY    . COLONOSCOPY WITH PROPOFOL N/A 04/15/2019   Procedure: COLONOSCOPY WITH PROPOFOL;  Surgeon: Pasty Spillers, MD;  Location: [facility] ENDOSCOPY;  Service: Endoscopy;  Laterality: N/A;  . ESOPHAGOGASTRODUODENOSCOPY (EGD) WITH PROPOFOL N/A 04/15/2019   Procedure: ESOPHAGOGASTRODUODENOSCOPY (EGD) WITH PROPOFOL;  Surgeon: Pasty Spillers, MD;  Location: [facility] ENDOSCOPY;  Service: Endoscopy;  Laterality: N/A;  . THYROIDECTOMY    . THYROIDECTOMY      Prior to Admission medications   Medication Sig Start Date End Date Taking? Authorizing Provider  famotidine (PEPCID) 40 MG tablet  06/02/19   [provider]  levothyroxine (SYNTHROID, LEVOTHROID) 137 MCG tablet Take 137 mcg by mouth daily before breakfast.    [provider]  predniSONE (DELTASONE) 20 MG tablet Take 1 tablet (20 mg total) by mouth 2 (two) times daily with a meal for 5 days. 06/15/19 06/20/19  Jamaiya Tunnell, Charlesetta Ivory, PA-C    Allergies Patient has no known allergies.  Family History  Problem Relation Age of Onset  . Breast cancer Neg Hx     Social History Social History  Tobacco Use  . Smoking status: Never Smoker  . Smokeless tobacco: Never Used  Substance Use Topics  . Alcohol use: No    Alcohol/week: 0.0 standard drinks  . Drug use: No    Review of Systems  Constitutional: Negative for fever. Eyes: Negative for visual changes. ENT: Negative for sore throat. Cardiovascular: Negative for chest pain. Respiratory: Negative for  shortness of breath. Gastrointestinal: Negative for abdominal pain, vomiting and diarrhea. Genitourinary: Negative for dysuria. Musculoskeletal: Negative for back pain.  Left shoulder pain as above. Skin: Negative for rash. Neurological: Negative for headaches, focal weakness or numbness. ____________________________________________  PHYSICAL EXAM:  VITAL SIGNS: ED Triage Vitals  Enc Vitals Group     BP 06/15/19 1701 (!) 157/76     Pulse Rate 06/15/19 1701 76     Resp 06/15/19 1701 14     Temp 06/15/19 1701 98.1 F (36.7 C)     Temp Source 06/15/19 1701 Oral     SpO2 06/15/19 1701 98 %     Weight 06/15/19 1702 228 lb (103.4 kg)     Height 06/15/19 1702 5\' 3"  (1.6 m)     Head Circumference --      Peak Flow --      Pain Score 06/15/19 1701 6     Pain Loc --      Pain Edu? --      Excl. in GC? --     Constitutional: Alert and oriented. Well appearing and in no distress. Head: Normocephalic and atraumatic. Eyes: Conjunctivae are normal. Normal extraocular movements Neck: Supple.  Normal range of motion without crepitus. Cardiovascular: Normal rate, regular rhythm. Normal distal pulses. Respiratory: Normal respiratory effort. No wheezes/rales/rhonchi. Gastrointestinal: Soft and nontender. No distention. Musculoskeletal: Left shoulder without any obvious deformity, dislocation, or sulcus sign.  Patient does have normal composite fist bilaterally.  She has full active range of motion of the shoulder but shows pain with extension above 120 degrees.  Normal rotator cuff resistance testing bilaterally.  Patient is also mildly tender to palpation to the biceps tendon region.  Nontender with normal range of motion in all extremities.  Neurologic: Cranial nerves II through XII grossly intact.  Normal UV DTRs bilaterally.  Normal gait without ataxia. Normal speech and language. No gross focal neurologic deficits are appreciated. Skin:  Skin is warm, dry and intact. No rash  noted. ____________________________________________   RADIOLOGY  DG Left Shoulder  Negative ____________________________________________  PROCEDURES  Toradol 30 mg IM Procedures ____________________________________________  INITIAL IMPRESSION / ASSESSMENT AND PLAN / ED COURSE  Patient with ED evaluation of ongoing left shoulder pain and paresthesias.  Clinical picture is consistent with a probable rotator cuff tendinitis.  Patient's x-ray is negative at this time presentation.  She is discharged with a prescription for prednisone to take with the previously prescribed antispasm medication.  She is also referred to Ortho at this time for further evaluation management of her ongoing shoulder pain.  Patient verbalized understanding of discharge instructions and will follow-up as appropriate.  Morgan Stevens was evaluated in Emergency Department on 06/15/2019 for the symptoms described in the history of present illness. She was evaluated in the context of the global COVID-19 pandemic, which necessitated consideration that the patient might be at risk for infection with the SARS-CoV-2 virus that causes COVID-19. Institutional protocols and algorithms that pertain to the evaluation of patients at risk for COVID-19 are in a state of rapid change based on information released by regulatory bodies including the CDC and  federal and state organizations. These policies and algorithms were followed during the patient's care in the ED. ____________________________________________  FINAL CLINICAL IMPRESSION(S) / ED DIAGNOSES  Final diagnoses:  Acute pain of left shoulder  Tendinitis of shoulder, left      Lynkin Saini, Dannielle Karvonen, PA-C 06/15/19 1918    Arta Silence, MD 06/15/19 2059

## 2019-06-15 NOTE — ED Triage Notes (Signed)
Patient c/o left arm/shoulder pain since this AM. Denies injury

## 2019-06-17 ENCOUNTER — Emergency Department
Admission: EM | Admit: 2019-06-17 | Discharge: 2019-06-17 | Disposition: A | Discharge status: Home / Self Care | Payer: Medicaid Other | Attending: Emergency Medicine | Admitting: Emergency Medicine

## 2019-06-17 ENCOUNTER — Encounter: Payer: Self-pay | Admitting: Emergency Medicine

## 2019-06-17 DIAGNOSIS — R1013 Epigastric pain: Secondary | ICD-10-CM | POA: Diagnosis present

## 2019-06-17 DIAGNOSIS — Z5321 Procedure and treatment not carried out due to patient leaving prior to being seen by health care provider: Secondary | ICD-10-CM | POA: Insufficient documentation

## 2019-06-17 LAB — URINALYSIS, COMPLETE (UACMP) WITH MICROSCOPIC
Bacteria, UA: NONE SEEN
Bilirubin Urine: NEGATIVE
Glucose, UA: NEGATIVE mg/dL
Ketones, ur: NEGATIVE mg/dL
Leukocytes,Ua: NEGATIVE
Nitrite: NEGATIVE
Protein, ur: NEGATIVE mg/dL
Specific Gravity, Urine: 1.02 (ref 1.005–1.030)
pH: 6 (ref 5.0–8.0)

## 2019-06-17 LAB — COMPREHENSIVE METABOLIC PANEL
ALT: 38 U/L (ref 0–44)
AST: 24 U/L (ref 15–41)
Albumin: 4.1 g/dL (ref 3.5–5.0)
Alkaline Phosphatase: 38 U/L (ref 38–126)
Anion gap: 7 (ref 5–15)
BUN: 16 mg/dL (ref 6–20)
CO2: 27 mmol/L (ref 22–32)
Calcium: 9 mg/dL (ref 8.9–10.3)
Chloride: 105 mmol/L (ref 98–111)
Creatinine, Ser: 0.84 mg/dL (ref 0.44–1.00)
GFR calc Af Amer: >60 mL/min (ref >60)
GFR calc non Af Amer: >60 mL/min (ref >60)
Glucose, Bld: 109 mg/dL — ABNORMAL HIGH (ref 70–99)
Potassium: 3.2 mmol/L — ABNORMAL LOW (ref 3.5–5.1)
Sodium: 139 mmol/L (ref 135–145)
Total Bilirubin: 0.6 mg/dL (ref 0.3–1.2)
Total Protein: 7.7 g/dL (ref 6.5–8.1)

## 2019-06-17 LAB — CBC
HCT: 38.1 % (ref 36.0–46.0)
Hemoglobin: 13.3 g/dL (ref 12.0–15.0)
MCH: 30.2 pg (ref 26.0–34.0)
MCHC: 34.9 g/dL (ref 30.0–36.0)
MCV: 86.6 fL (ref 80.0–100.0)
Platelets: 364 10*3/uL (ref 150–400)
RBC: 4.4 MIL/uL (ref 3.87–5.11)
RDW: 13 % (ref 11.5–15.5)
WBC: 10 10*3/uL (ref 4.0–10.5)
nRBC: 0 % (ref 0.0–0.2)

## 2019-06-17 LAB — POCT PREGNANCY, URINE: Preg Test, Ur: NEGATIVE

## 2019-06-17 LAB — LIPASE, BLOOD: Lipase: 23 U/L (ref 11–51)

## 2019-06-17 LAB — TROPONIN I (HIGH SENSITIVITY): Troponin I (High Sensitivity): 2 ng/L (ref <18)

## 2019-06-17 MED ORDER — SODIUM CHLORIDE 0.9% FLUSH: 3.0000 mL | Freq: Once | INTRAVENOUS | Status: DC

## 2019-06-17 NOTE — ED Notes (Signed)
No answer when called several times from lobby 

## 2019-06-17 NOTE — ED Triage Notes (Signed)
Pt c/o upper epigastric abdominal pain x1 hour that radiates around both sides and into back. Pt denies N/V/D.

## 2019-06-18 ENCOUNTER — Telehealth: Payer: Self-pay | Admitting: Emergency Medicine

## 2019-06-18 NOTE — Telephone Encounter (Signed)
Called patient due to lwot to inquire about condition and follow up plans. Left message.   

## 2019-06-23 DIAGNOSIS — R634 Abnormal weight loss: Secondary | ICD-10-CM | POA: Diagnosis not present

## 2019-06-23 DIAGNOSIS — R1013 Epigastric pain: Secondary | ICD-10-CM | POA: Diagnosis not present

## 2019-06-23 DIAGNOSIS — K2100 Gastro-esophageal reflux disease with esophagitis, without bleeding: Secondary | ICD-10-CM | POA: Diagnosis not present

## 2019-06-24 ENCOUNTER — Ambulatory Visit: Payer: Medicaid Other | Admitting: Gastroenterology

## 2019-06-24 ENCOUNTER — Other Ambulatory Visit: Payer: Self-pay

## 2019-06-24 ENCOUNTER — Encounter: Payer: Self-pay | Admitting: Gastroenterology

## 2019-06-24 VITALS — BP 129/85 | HR 86 | Temp 98.3°F | Wt 225.5 lb

## 2019-06-24 DIAGNOSIS — R109 Unspecified abdominal pain: Secondary | ICD-10-CM | POA: Diagnosis not present

## 2019-06-24 DIAGNOSIS — K5900 Constipation, unspecified: Secondary | ICD-10-CM

## 2019-06-24 MED ORDER — METAMUCIL SMOOTH TEXTURE 58.6 % PO POWD: 1.0000 | Freq: Every day | ORAL | 0 refills | Status: AC

## 2019-06-24 NOTE — Patient Instructions (Signed)

## 2019-06-24 NOTE — Progress Notes (Signed)
Morgan BouillonVarnita Skyanne Welle, MD [address]  [address]  [place], [place] [address]  Main: [phone]  Fax: [phone]   Primary Care Physician: Christiansen, [place] Nicole, PA-C   Chief Complaint  Patient presents with  . Abdominal Pain    Is having right side pain that radiates to her stomach     HPI: Morgan CoventryRuth Stevens is a 53 y.o. female was asked to see us by her primary care provider due to abdominal pain.  Patient reports 1 week history of right mid to right lower abdominal pain, radiating to the mid abdomen and left quadrants as well.  It is intermittent.  Improves after bowel movements.  Reporting constipation as well, with not having any bowel movements for 2 to 3 days and then will have some loose bowel movements.  No nausea vomiting or weight loss.  Previously had dysphagia which is now resolved after treatment of Candida esophagitis with fluconazole.  Has had recent upper endoscopy and colonoscopy in August 2020  Colonoscopy showed diverticulosis and was otherwise normal.  Underwent an EGD in August 2020 Widely patent Schatzki ring. - Esophageal mucosal changes suggestive of eosinophilic esophagitis. Biopsied. - Medium-sized hiatal hernia. - Non-bleeding erosive gastropathy. Biopsied. - 4-5 gastric polyps. - Erythematous mucosa in the antrum. Biopsied. - Normal duodenal bulb, second portion of the duodenum, third portion of the duodenum and examined duodenum. - Biopsies were obtained in the gastric body, at the incisura and in the gastric antrum.  Surgical Pathology  DIAGNOSIS:  A. STOMACH, EROSION; COLD BIOPSY:  - CHRONIC AND FOCALLY ACTIVE GASTRITIS WITH SURFACE EROSION.  - NEGATIVE FOR HELICOBACTER PYLORI  - NEGATIVE FOR INTESTINAL METAPLASIA, DYSPLASIA, AND MALIGNANCY.   B. STOMACH POLYP; COLD BIOPSY:  - FUNDIC GLAND POLYP.  - NEGATIVE FOR DYSPLASIA AND MALIGNANCY.   C. ESOPHAGUS, DISTAL; COLD BIOPSY:  - FEATURES SUGGESTIVE OF REFLUX ESOPHAGITIS.  -  NEGATIVE FOR INCREASED EOSINOPHILS (< 5 PER HIGH-POWER FIELD).  - NEGATIVE FOR INTESTINAL METAPLASIA, DYSPLASIA, AND MALIGNANCY.   D. ESOPHAGUS, PROXIMAL; COLD BIOPSY:  - FUNGAL ESOPHAGITIS, MORPHOLOGICALLY CONSISTENT WITH CANDIDA.  - NEGATIVE FOR INCREASED EOSINOPHILS (<1 PER HIGH-POWER FIELD).  - NEGATIVE FOR INTESTINAL METAPLASIA, DYSPLASIA, AND MALIGNANCY.   Comment:  Immunohistochemical stain for H. pylori (block A1) is negative.  Was on omeprazole which has helped her abdominal pain.  This was switched to Pepcid twice daily recently and this is helping as well.  Colonoscopy was normal and repeat recommended in 5 years  [facility] which showed a wide-open Schatzki's ring, grade a esophagitis. Tortuous esophagus. 2 cm hiatal hernia. Few gastric polyps. Pathology showed fundic gland polyps, no intestinal metaplasia on stomach biopsies. Chronic active gastritis. Negative H. pylori. GE junction biopsies with squamocolumnar junction with reflux related changes, no intestinal metaplasia. Esophageal biopsies with no increase in intraepithelial eosinophils. Current Outpatient Medications  Medication Sig Dispense Refill  . cyclobenzaprine (FLEXERIL) 10 MG tablet Take by mouth.    . levothyroxine (SYNTHROID, LEVOTHROID) 137 MCG tablet Take 137 mcg by mouth daily before breakfast.    . omeprazole (PRILOSEC) 20 MG capsule Take 20 mg by mouth 2 (two) times daily before a meal.    . psyllium (METAMUCIL SMOOTH TEXTURE) 58.6 % powder Take 1 packet by mouth daily. 90 packet 0   No current facility-administered medications for this visit.     Allergies as of 06/24/2019  . (No Known Allergies)    ROS:  General: Negative for anorexia, weight loss,  fever, chills, fatigue, weakness. ENT: Negative for hoarseness, difficulty swallowing , nasal congestion. CV: Negative for chest pain, angina, palpitations, dyspnea on exertion, peripheral edema.  Respiratory:  Negative for dyspnea at rest, dyspnea on exertion, cough, sputum, wheezing.  GI: See history of present illness. GU:  Negative for dysuria, hematuria, urinary incontinence, urinary frequency, nocturnal urination.  Endo: Negative for unusual weight change.    Physical Examination:   BP 129/85 (BP Location: Left Arm, Patient Position: Sitting, Cuff Size: Normal)   Pulse 86   Temp 98.3 F (36.8 C) (Oral)   Wt 225 lb 8 oz (102.3 kg)   LMP 06/12/2019 (Exact Date)   BMI 39.95 kg/m   General: Well-nourished, well-developed in no acute distress.  Eyes: No icterus. Conjunctivae pink. Mouth: Oropharyngeal mucosa moist and pink , no lesions erythema or exudate. Neck: Supple, Trachea midline Abdomen: Bowel sounds are normal, nontender, nondistended, no hepatosplenomegaly or masses, no abdominal bruits or hernia , no rebound or guarding.   Extremities: No lower extremity edema. No clubbing or deformities. Neuro: Alert and oriented x 3.  Grossly intact. Skin: Warm and dry, no jaundice.   Psych: Alert and cooperative, normal mood and affect.   Labs: CMP     Component Value Date/Time   NA 139 06/17/2019 1910   NA 139 08/02/2014 1624   K 3.2 (L) 06/17/2019 1910   K 3.9 08/02/2014 1624   CL 105 06/17/2019 1910   CL 103 08/02/2014 1624   CO2 27 06/17/2019 1910   CO2 27 08/02/2014 1624   GLUCOSE 109 (H) 06/17/2019 1910   GLUCOSE 113 (H) 08/02/2014 1624   BUN 16 06/17/2019 1910   BUN 10 08/02/2014 1624   CREATININE 0.84 06/17/2019 1910   CREATININE 0.85 08/02/2014 1624   CALCIUM 9.0 06/17/2019 1910   CALCIUM 8.6 08/02/2014 1624   PROT 7.7 06/17/2019 1910   PROT 7.3 08/02/2014 1624   ALBUMIN 4.1 06/17/2019 1910   ALBUMIN 3.6 08/02/2014 1624   AST 24 06/17/2019 1910   AST 22 08/02/2014 1624   ALT 38 06/17/2019 1910   ALT 26 08/02/2014 1624   ALKPHOS 38 06/17/2019 1910   ALKPHOS 40 (L) 08/02/2014 1624   BILITOT 0.6 06/17/2019 1910   BILITOT 0.4 08/02/2014 1624   GFRNONAA >60  06/17/2019 1910   GFRNONAA >60 08/02/2014 1624   GFRNONAA >60 05/06/2014 2154   GFRAA >60 06/17/2019 1910   GFRAA >60 08/02/2014 1624   GFRAA >60 05/06/2014 2154   Lab Results  Component Value Date   WBC 10.0 06/17/2019   HGB 13.3 06/17/2019   HCT 38.1 06/17/2019   MCV 86.6 06/17/2019   PLT 364 06/17/2019    Imaging Studies: Dg Shoulder Left  Result Date: 06/15/2019 CLINICAL DATA:  Left shoulder pain for 3 months without known injury. EXAM: LEFT SHOULDER - 2+ VIEW COMPARISON:  July 08, 2016. FINDINGS: There is no evidence of fracture or dislocation. There is no evidence of arthropathy or other focal bone abnormality. Soft tissues are unremarkable. IMPRESSION: Negative. Electronically Signed   By: Lupita Raider M.D.   On: 06/15/2019 18:07    Assessment and Plan:   Poet Hineman is a 53 y.o. y/o female with abdominal pain  There are no alarm features present such as fever chills or acute worsening pain  Symptoms are likely related to constipation  High-fiber diet Metamucil daily with goal of 1-2 soft bowel movements daily.  If not at goal, patient instructed to increase dose to twice daily.  If loose stools with the medication, patient asked to decrease the medication to every other day, or half dose daily.  Patient verbalized understanding  If pain does not get better she was asked to contact us and she verbalized understanding  Can consider CT scan if pain worsens, however I am not suspecting diverticulitis at this time given benign clinical history and exam and reassuring labs recently   Dr [name]

## 2019-07-29 ENCOUNTER — Emergency Department
Admission: EM | Admit: 2019-07-29 | Discharge: 2019-07-30 | Disposition: A | Discharge status: Home / Self Care | Payer: Medicaid Other | Attending: Emergency Medicine | Admitting: Emergency Medicine

## 2019-07-29 ENCOUNTER — Other Ambulatory Visit: Payer: Self-pay

## 2019-07-29 ENCOUNTER — Emergency Department: Payer: Medicaid Other

## 2019-07-29 DIAGNOSIS — Z79899 Other long term (current) drug therapy: Secondary | ICD-10-CM | POA: Diagnosis not present

## 2019-07-29 DIAGNOSIS — R0602 Shortness of breath: Secondary | ICD-10-CM | POA: Diagnosis not present

## 2019-07-29 DIAGNOSIS — I10 Essential (primary) hypertension: Secondary | ICD-10-CM | POA: Diagnosis not present

## 2019-07-29 DIAGNOSIS — R079 Chest pain, unspecified: Secondary | ICD-10-CM

## 2019-07-29 DIAGNOSIS — E039 Hypothyroidism, unspecified: Secondary | ICD-10-CM | POA: Insufficient documentation

## 2019-07-29 DIAGNOSIS — R0789 Other chest pain: Secondary | ICD-10-CM | POA: Diagnosis not present

## 2019-07-29 LAB — CBC
HCT: 34.9 % — ABNORMAL LOW (ref 36.0–46.0)
Hemoglobin: 12.5 g/dL (ref 12.0–15.0)
MCH: 30 pg (ref 26.0–34.0)
MCHC: 35.8 g/dL (ref 30.0–36.0)
MCV: 83.7 fL (ref 80.0–100.0)
Platelets: 292 10*3/uL (ref 150–400)
RBC: 4.17 MIL/uL (ref 3.87–5.11)
RDW: 12.9 % (ref 11.5–15.5)
WBC: 7.2 10*3/uL (ref 4.0–10.5)
nRBC: 0 % (ref 0.0–0.2)

## 2019-07-29 LAB — BASIC METABOLIC PANEL
Anion gap: 10 (ref 5–15)
BUN: 9 mg/dL (ref 6–20)
CO2: 23 mmol/L (ref 22–32)
Calcium: 8.9 mg/dL (ref 8.9–10.3)
Chloride: 104 mmol/L (ref 98–111)
Creatinine, Ser: 0.64 mg/dL (ref 0.44–1.00)
GFR calc Af Amer: >60 mL/min (ref >60)
GFR calc non Af Amer: >60 mL/min (ref >60)
Glucose, Bld: 103 mg/dL — ABNORMAL HIGH (ref 70–99)
Potassium: 3.6 mmol/L (ref 3.5–5.1)
Sodium: 137 mmol/L (ref 135–145)

## 2019-07-29 LAB — FIBRIN DERIVATIVES D-DIMER (ARMC ONLY): Fibrin derivatives D-dimer (ARMC): 149.5 ng/mL (FEU) (ref 0.00–499.00)

## 2019-07-29 LAB — TROPONIN I (HIGH SENSITIVITY): Troponin I (High Sensitivity): 3 ng/L (ref <18)

## 2019-07-29 IMAGING — DX DG CHEST 1V PORT
1 series · 1 of 1 positions shown · non-contrast
Comparison: [DATE]

CLINICAL DATA: Chest pain

EXAM:
PORTABLE CHEST 1 VIEW

[chest ap]
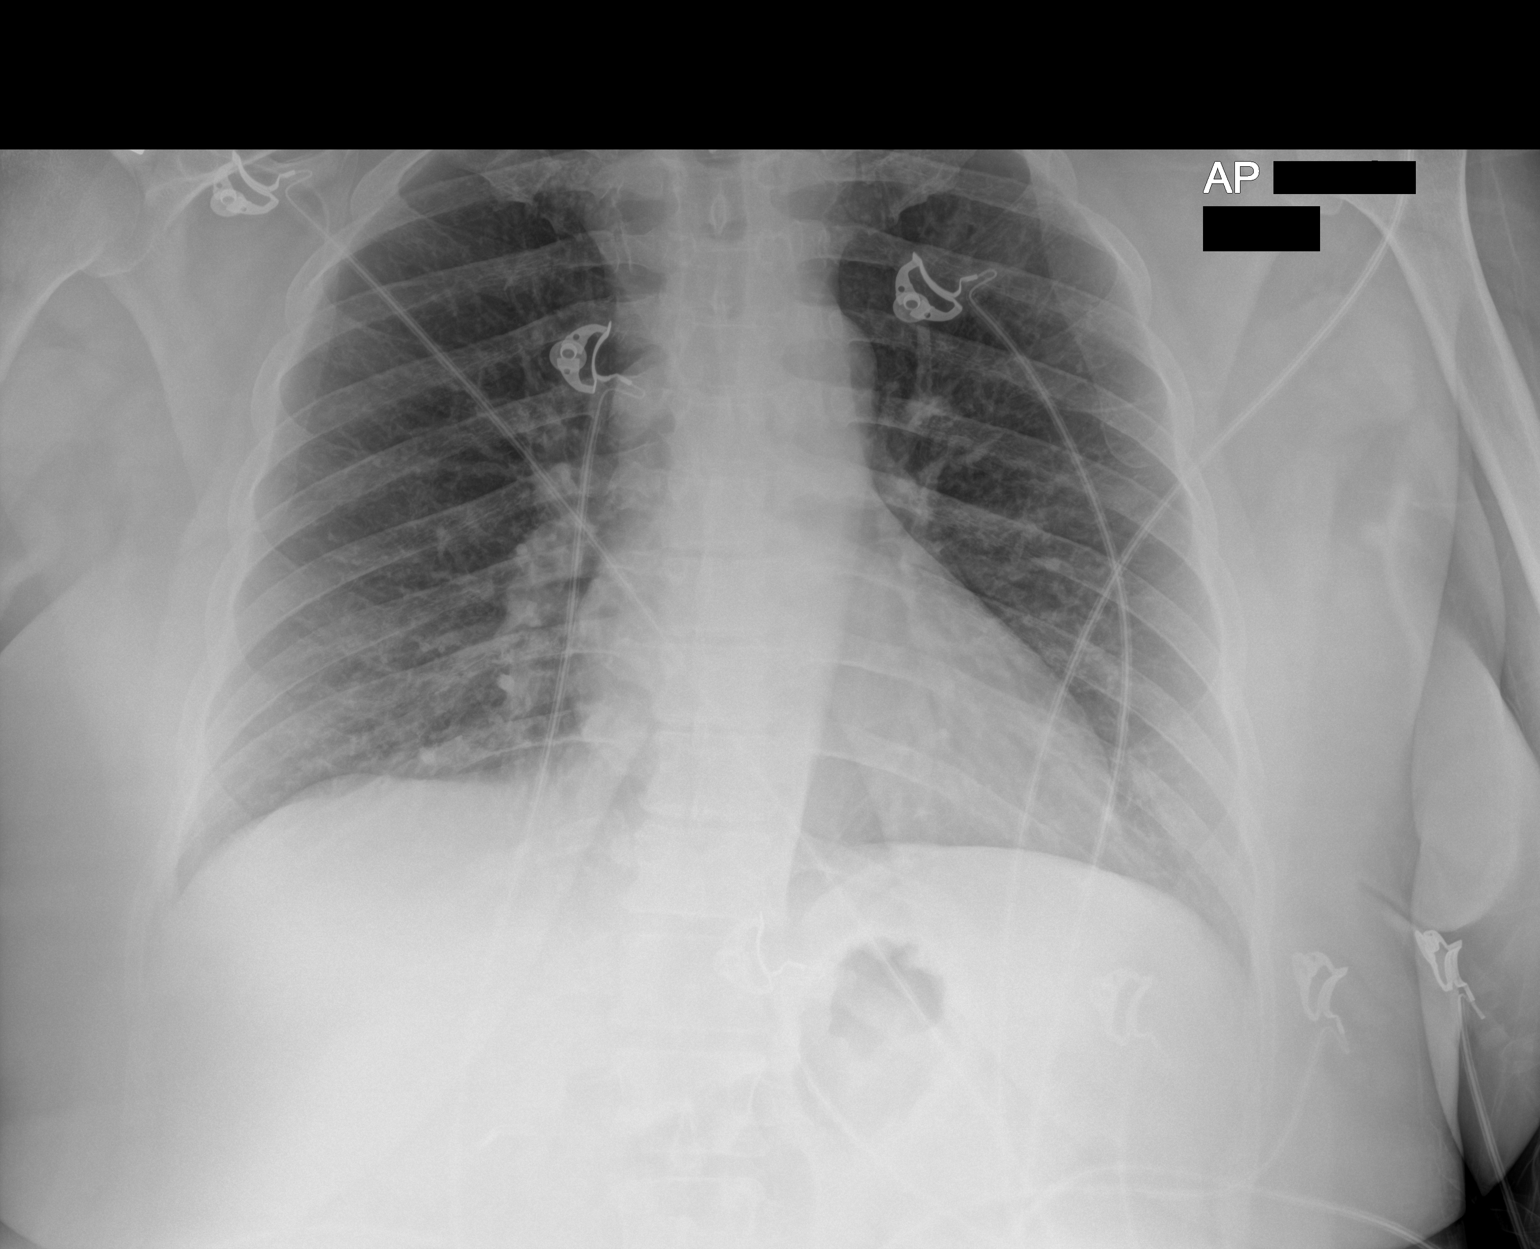

[1 of 1 positions shown; findings below may reference images not displayed]

FINDINGS: The heart size and mediastinal contours are within normal limits.
Both lungs are clear. The visualized skeletal structures are
unremarkable.
IMPRESSION: No active disease.

## 2019-07-29 MED ORDER — NITROGLYCERIN 0.4 MG SL SUBL
0.4000 mg | SUBLINGUAL_TABLET | SUBLINGUAL | Status: DC | PRN
  Administered 2019-07-29: 0.4 mg via SUBLINGUAL
  Filled 2019-07-29: qty 1

## 2019-07-29 MED ORDER — ALUM & MAG HYDROXIDE-SIMETH 200-200-20 MG/5ML PO SUSP
30.0000 mL | Freq: Once | ORAL | Status: AC
  Administered 2019-07-29: 30 mL via ORAL
  Filled 2019-07-29: qty 30

## 2019-07-29 MED ORDER — LIDOCAINE VISCOUS HCL 2 % MT SOLN
15.0000 mL | Freq: Once | OROMUCOSAL | Status: AC
  Administered 2019-07-29: 15 mL via ORAL
  Filled 2019-07-29: qty 15

## 2019-07-29 NOTE — ED Notes (Signed)
Pt given warm blanket and phone to call husband.

## 2019-07-29 NOTE — ED Triage Notes (Signed)
Pt to ED via EMS from home. Pt arrives c/o chest pain that started tonight, left side radiating to left arm. Pt denies any cardiac hx, pt took 6 asa and 2 NTG prior to arrival with slight relief. Pt states chest pain has made her feel short of breath as well. VSS. NSR on monitor.

## 2019-07-29 NOTE — ED Provider Notes (Signed)
[facility] Emergency Department Provider Note   ____________________________________________   First MD Initiated Contact with Patient 07/29/19 2211     (approximate)  I have reviewed the triage vital signs and the nursing notes.   HISTORY  Chief Complaint Chest Pain    HPI Morgan Stevens is a 53 y.o. female who reports at about 8:00 she had onset of crampy sharp pain in the left side of her chest going into the left arm.  Might of been a little worse if she took a deep breath.  She was short of breath with it.  She said it was so bad she could barely walk.  She was not nauseated or sweaty with it.  She does not smoke.  She has never had anything like this before.  She had a negative cath in 2017.  She has no family history of heart disease although her mom had an artificial mitral valve.  She does not smoke.  The pain was severe is now somewhat better with aspirin and nitro.     Past Medical History:  Diagnosis Date   Gastritis    Hiatal hernia    Obesity (BMI 30-39.9)    Reflux    Thyroid disease     Patient Active Problem List   Diagnosis Date Noted   Stricture and stenosis of esophagus    Columnar epithelial-lined lower esophagus    Stomach irritation    Gastric polyp    Esophageal dysphagia    Encounter for screening colonoscopy    Diverticulosis of large intestine without diverticulitis    Conversion disorder 06/11/2018   Esophagitis, [place] grade A 09/06/2017   Hiatal hernia 09/06/2017   Schatzki's ring of distal esophagus 09/06/2017   Other constipation 07/05/2017   Sciatica 03/27/2017   Pain in the chest    Abnormal cardiac function test 02/26/2016   Obesity (BMI 30-39.9)    Angina pectoris (HCC) 02/25/2016   Dyspnea 02/25/2016   Hypothyroidism 02/25/2016   Postoperative hypothyroidism 02/25/2016   Chest pain 09/07/2015   GERD (gastroesophageal reflux disease) 01/21/2014   Gestational diabetes  01/21/2014   Tonsillar hypertrophy 01/21/2014   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    Past Surgical History:  Procedure Laterality Date   CARDIAC CATHETERIZATION  02/27/2016   Procedure: Left Heart Cath and Coronary Angiography;  Surgeon: Corky CraftsJayadeep S Varanasi, MD;  [facility];  Service: Cardiovascular;;   CESAREAN SECTION     CHOLECYSTECTOMY     COLONOSCOPY WITH PROPOFOL N/A 04/15/2019   Procedure: COLONOSCOPY WITH PROPOFOL;  Surgeon: Pasty Spillersahiliani, Varnita B, MD;  Location: [facility] ENDOSCOPY;  Service: Endoscopy;  Laterality: N/A;   ESOPHAGOGASTRODUODENOSCOPY (EGD) WITH PROPOFOL N/A 04/15/2019   Procedure: ESOPHAGOGASTRODUODENOSCOPY (EGD) WITH PROPOFOL;  Surgeon: Pasty Spillersahiliani, Varnita B, MD;  Location: [facility] ENDOSCOPY;  Service: Endoscopy;  Laterality: N/A;   THYROIDECTOMY     THYROIDECTOMY      Prior to Admission medications   Medication Sig Start Date End Date Taking? Authorizing Provider  cyclobenzaprine (FLEXERIL) 10 MG tablet Take by mouth. 06/12/19   [provider]  levothyroxine (SYNTHROID, LEVOTHROID) 137 MCG tablet Take 137 mcg by mouth daily before breakfast.    [provider]  omeprazole (PRILOSEC) 20 MG capsule Take 20 mg by mouth 2 (two) times daily before a meal.    [provider]  psyllium (METAMUCIL SMOOTH TEXTURE) 58.6 % powder Take 1 packet by mouth daily. 06/24/19 09/22/19  Pasty Spillersahiliani, Varnita B, MD    Allergies Patient  has no known allergies.  Family History  Problem Relation Age of Onset   Breast cancer Neg Hx     Social History Social History   Tobacco Use   Smoking status: Never Smoker   Smokeless tobacco: Never Used  Substance Use Topics   Alcohol use: No    Alcohol/week: 0.0 standard drinks   Drug use: No    Review of Systems  Constitutional: No fever/chills Eyes: No visual changes. ENT: No sore throat. Cardiovascular: chest pain. Respiratory:  shortness of breath. Gastrointestinal: No  abdominal pain.  No nausea, no vomiting.  No diarrhea.  No constipation. Genitourinary: Negative for dysuria. Musculoskeletal: Negative for back pain. Skin: Negative for rash. Neurological: Negative for headaches, focal weakness   ____________________________________________   PHYSICAL EXAM:  VITAL SIGNS: ED Triage Vitals  Enc Vitals Group     BP 07/29/19 2152 133/77     Pulse Rate 07/29/19 2152 84     Resp 07/29/19 2152 15     Temp 07/29/19 2152 98.4 F (36.9 C)     Temp Source 07/29/19 2152 Oral     SpO2 07/29/19 2152 99 %     Weight 07/29/19 2150 230 lb (104.3 kg)     Height 07/29/19 2150 5\' 4"  (1.626 m)     Head Circumference --      Peak Flow --      Pain Score 07/29/19 2150 7     Pain Loc --      Pain Edu? --      Excl. in [place]? --     Constitutional: Alert and oriented. Well appearing and in no acute distress. Eyes: Conjunctivae are normal.  Head: Atraumatic. Nose: No congestion/rhinnorhea. Mouth/Throat: Mucous membranes are moist.  Oropharynx non-erythematous. Neck: No stridor. Cardiovascular: Normal rate, regular rhythm. Grossly normal heart sounds.  Good peripheral circulation.  No pain on palpation of the chest Respiratory: Normal respiratory effort.  No retractions. Lungs CTAB. Gastrointestinal: Soft and nontender. No distention. No abdominal bruits. No CVA tenderness. Musculoskeletal: No lower extremity tenderness nor edema.  No joint effusions. Neurologic:  Normal speech and language. No gross focal neurologic deficits are appreciated. Skin:  Skin is warm, dry and intact. No rash noted.   ____________________________________________   LABS (all labs ordered are listed, but only abnormal results are displayed)  Labs Reviewed  BASIC METABOLIC PANEL - Abnormal; Notable for the following components:      Result Value   Glucose, Bld 103 (*)    All other components within normal limits  CBC - Abnormal; Notable for the following components:   HCT 34.9 (*)     All other components within normal limits  FIBRIN DERIVATIVES D-DIMER ([facility] ONLY)  POC URINE PREG, ED  TROPONIN I (HIGH SENSITIVITY)   ____________________________________________  EKG  EKG read interpreted by me shows normal sinus rhythm rate of 82 normal axis nonspecific ST-T wave changes T wave in lead III is now inverted it was flagged earlier in August.  Same for the T wave in V3 these changes were however present on 16 August 2018. __EKG #2 read and interpreted by me shows normal sinus rhythm at the rate of 72 normal axis no change from #1 except for in the right __________________________________________  RADIOLOGY  ED MD interpretation: Chest x-ray read by radiology reviewed by me is clear  Official radiology report(s): Dg Chest Portable 1 View  Result Date: 07/29/2019 CLINICAL DATA:  Chest pain EXAM: PORTABLE CHEST 1 VIEW COMPARISON:  04/09/2019 FINDINGS: The  heart size and mediastinal contours are within normal limits. Both lungs are clear. The visualized skeletal structures are unremarkable. IMPRESSION: No active disease. Electronically Signed   By: Jasmine Pang M.D.   On: 07/29/2019 22:15    ____________________________________________   PROCEDURES  Procedure(s) performed (including Critical Care):  Procedures   ____________________________________________   INITIAL IMPRESSION / ASSESSMENT AND PLAN / ED COURSE  Initial troponin is negative D-dimer is negative however we will need a second troponin.  I am signing out the patient to Dr. Derrill Kay              ____________________________________________   FINAL CLINICAL IMPRESSION(S) / ED DIAGNOSES  Final diagnoses:  Chest pain, unspecified type     ED Discharge Orders    None       Note:  This document was prepared using Dragon voice recognition software and may include unintentional dictation errors.    Arnaldo Natal, MD 07/29/19 [phone]

## 2019-07-30 LAB — TROPONIN I (HIGH SENSITIVITY): Troponin I (High Sensitivity): 3 ng/L (ref <18)

## 2019-07-30 MED ORDER — KETOROLAC TROMETHAMINE 10 MG PO TABS: 10.0000 mg | ORAL_TABLET | Freq: Three times a day (TID) | ORAL | 0 refills | Status: DC | PRN

## 2019-07-30 MED ORDER — KETOROLAC TROMETHAMINE 30 MG/ML IJ SOLN
30.0000 mg | Freq: Once | INTRAMUSCULAR | Status: AC
  Administered 2019-07-30: 30 mg via INTRAVENOUS
  Filled 2019-07-30: qty 1

## 2019-07-30 NOTE — Discharge Instructions (Addendum)
Please seek medical attention for any high fevers, chest pain, shortness of breath, change in behavior, persistent vomiting, bloody stool or any other new or concerning symptoms.  

## 2019-07-30 NOTE — ED Provider Notes (Signed)
2nd troponin negative. Went over work up with patient. At this time no evidence for ACS or blood clot. CXR without pna or ptx. Doubt dissection. Patient did not get any relief from GI cocktail or nitroglycerin. Did have some relief with toradol. Did discuss with patient possibility of chest wall inflammation. Discussed importance of follow up and return precautions.    Nance Pear, MD 07/30/19 [phone]

## 2019-08-05 DIAGNOSIS — Z1159 Encounter for screening for other viral diseases: Secondary | ICD-10-CM | POA: Diagnosis not present

## 2019-08-05 DIAGNOSIS — M25512 Pain in left shoulder: Secondary | ICD-10-CM | POA: Diagnosis not present

## 2019-08-05 DIAGNOSIS — E890 Postprocedural hypothyroidism: Secondary | ICD-10-CM | POA: Diagnosis not present

## 2019-08-27 DIAGNOSIS — Z20828 Contact with and (suspected) exposure to other viral communicable diseases: Secondary | ICD-10-CM | POA: Diagnosis not present

## 2019-08-27 DIAGNOSIS — Z03818 Encounter for observation for suspected exposure to other biological agents ruled out: Secondary | ICD-10-CM | POA: Diagnosis not present

## 2019-09-11 ENCOUNTER — Other Ambulatory Visit: Payer: Medicaid Other

## 2019-09-14 ENCOUNTER — Emergency Department
Admission: EM | Admit: 2019-09-14 | Discharge: 2019-09-14 | Disposition: A | Discharge status: Home / Self Care | Payer: Medicaid Other | Attending: Emergency Medicine | Admitting: Emergency Medicine

## 2019-09-14 ENCOUNTER — Other Ambulatory Visit: Payer: Self-pay

## 2019-09-14 ENCOUNTER — Emergency Department: Payer: Medicaid Other

## 2019-09-14 ENCOUNTER — Encounter: Payer: Self-pay | Admitting: Emergency Medicine

## 2019-09-14 DIAGNOSIS — R079 Chest pain, unspecified: Secondary | ICD-10-CM | POA: Diagnosis not present

## 2019-09-14 DIAGNOSIS — R0789 Other chest pain: Secondary | ICD-10-CM

## 2019-09-14 DIAGNOSIS — J40 Bronchitis, not specified as acute or chronic: Secondary | ICD-10-CM | POA: Diagnosis not present

## 2019-09-14 DIAGNOSIS — Z79899 Other long term (current) drug therapy: Secondary | ICD-10-CM | POA: Diagnosis not present

## 2019-09-14 DIAGNOSIS — R0981 Nasal congestion: Secondary | ICD-10-CM | POA: Diagnosis not present

## 2019-09-14 LAB — CBC
HCT: 40.2 % (ref 36.0–46.0)
Hemoglobin: 13.7 g/dL (ref 12.0–15.0)
MCH: 30.2 pg (ref 26.0–34.0)
MCHC: 34.1 g/dL (ref 30.0–36.0)
MCV: 88.7 fL (ref 80.0–100.0)
Platelets: 362 10*3/uL (ref 150–400)
RBC: 4.53 MIL/uL (ref 3.87–5.11)
RDW: 13.2 % (ref 11.5–15.5)
WBC: 6.5 10*3/uL (ref 4.0–10.5)
nRBC: 0 % (ref 0.0–0.2)

## 2019-09-14 LAB — TROPONIN I (HIGH SENSITIVITY)
Troponin I (High Sensitivity): <2 ng/L (ref <18)
Troponin I (High Sensitivity): <2 ng/L (ref <18)

## 2019-09-14 LAB — BASIC METABOLIC PANEL
Anion gap: 10 (ref 5–15)
BUN: 8 mg/dL (ref 6–20)
CO2: 25 mmol/L (ref 22–32)
Calcium: 8.9 mg/dL (ref 8.9–10.3)
Chloride: 104 mmol/L (ref 98–111)
Creatinine, Ser: 0.78 mg/dL (ref 0.44–1.00)
GFR calc Af Amer: >60 mL/min (ref >60)
GFR calc non Af Amer: >60 mL/min (ref >60)
Glucose, Bld: 122 mg/dL — ABNORMAL HIGH (ref 70–99)
Potassium: 3.9 mmol/L (ref 3.5–5.1)
Sodium: 139 mmol/L (ref 135–145)

## 2019-09-14 IMAGING — CR DG CHEST 2V
1 series · 2 of 2 positions shown · non-contrast
Comparison: [DATE].

CLINICAL DATA: Left axillary pain radiating to the left chest and
back for 1 hour.

EXAM:
CHEST - 2 VIEW

[Series 1: dg chest 2 view · 0.14mm/px · 2 of 2 slices shown]
[im 1/2]
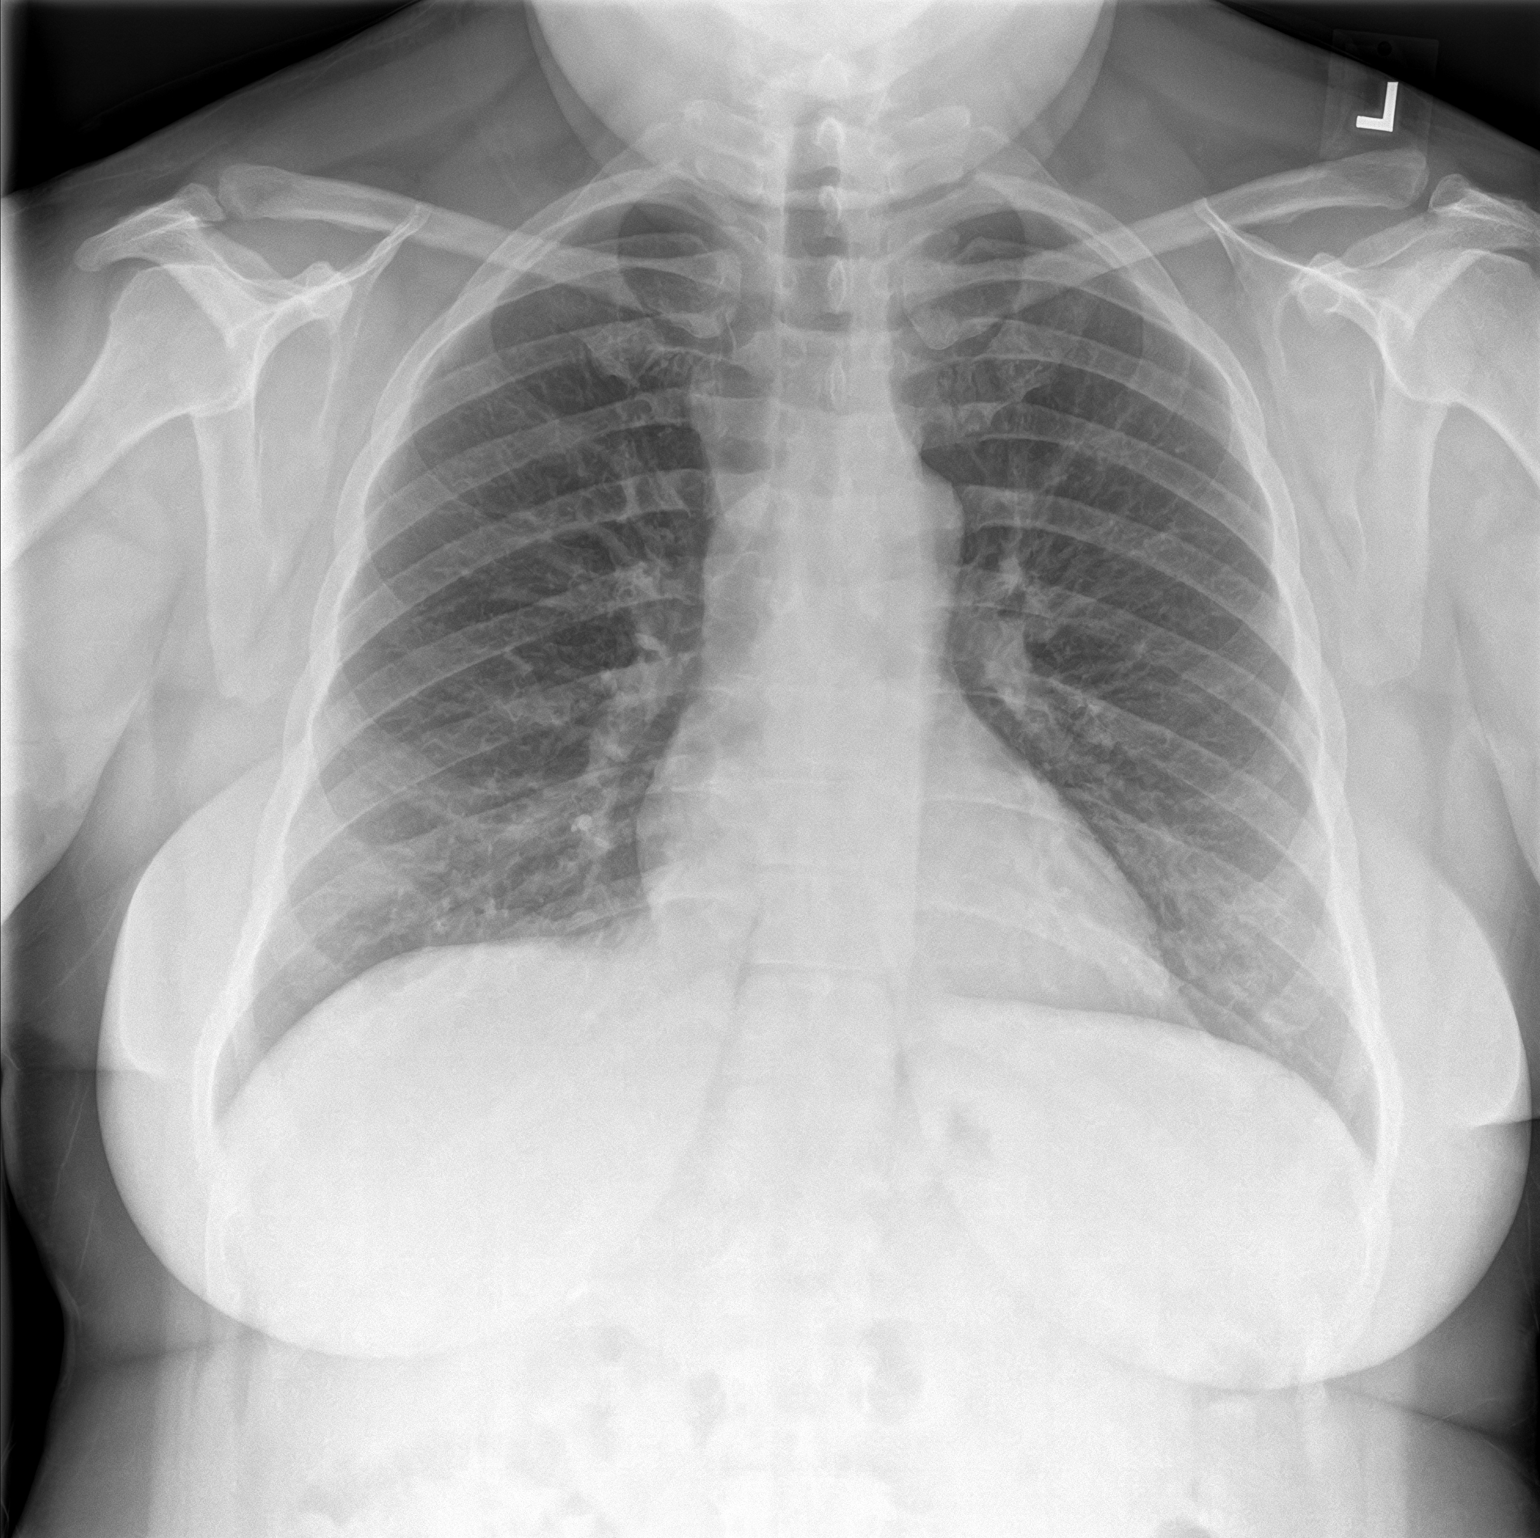
[im 2/2]
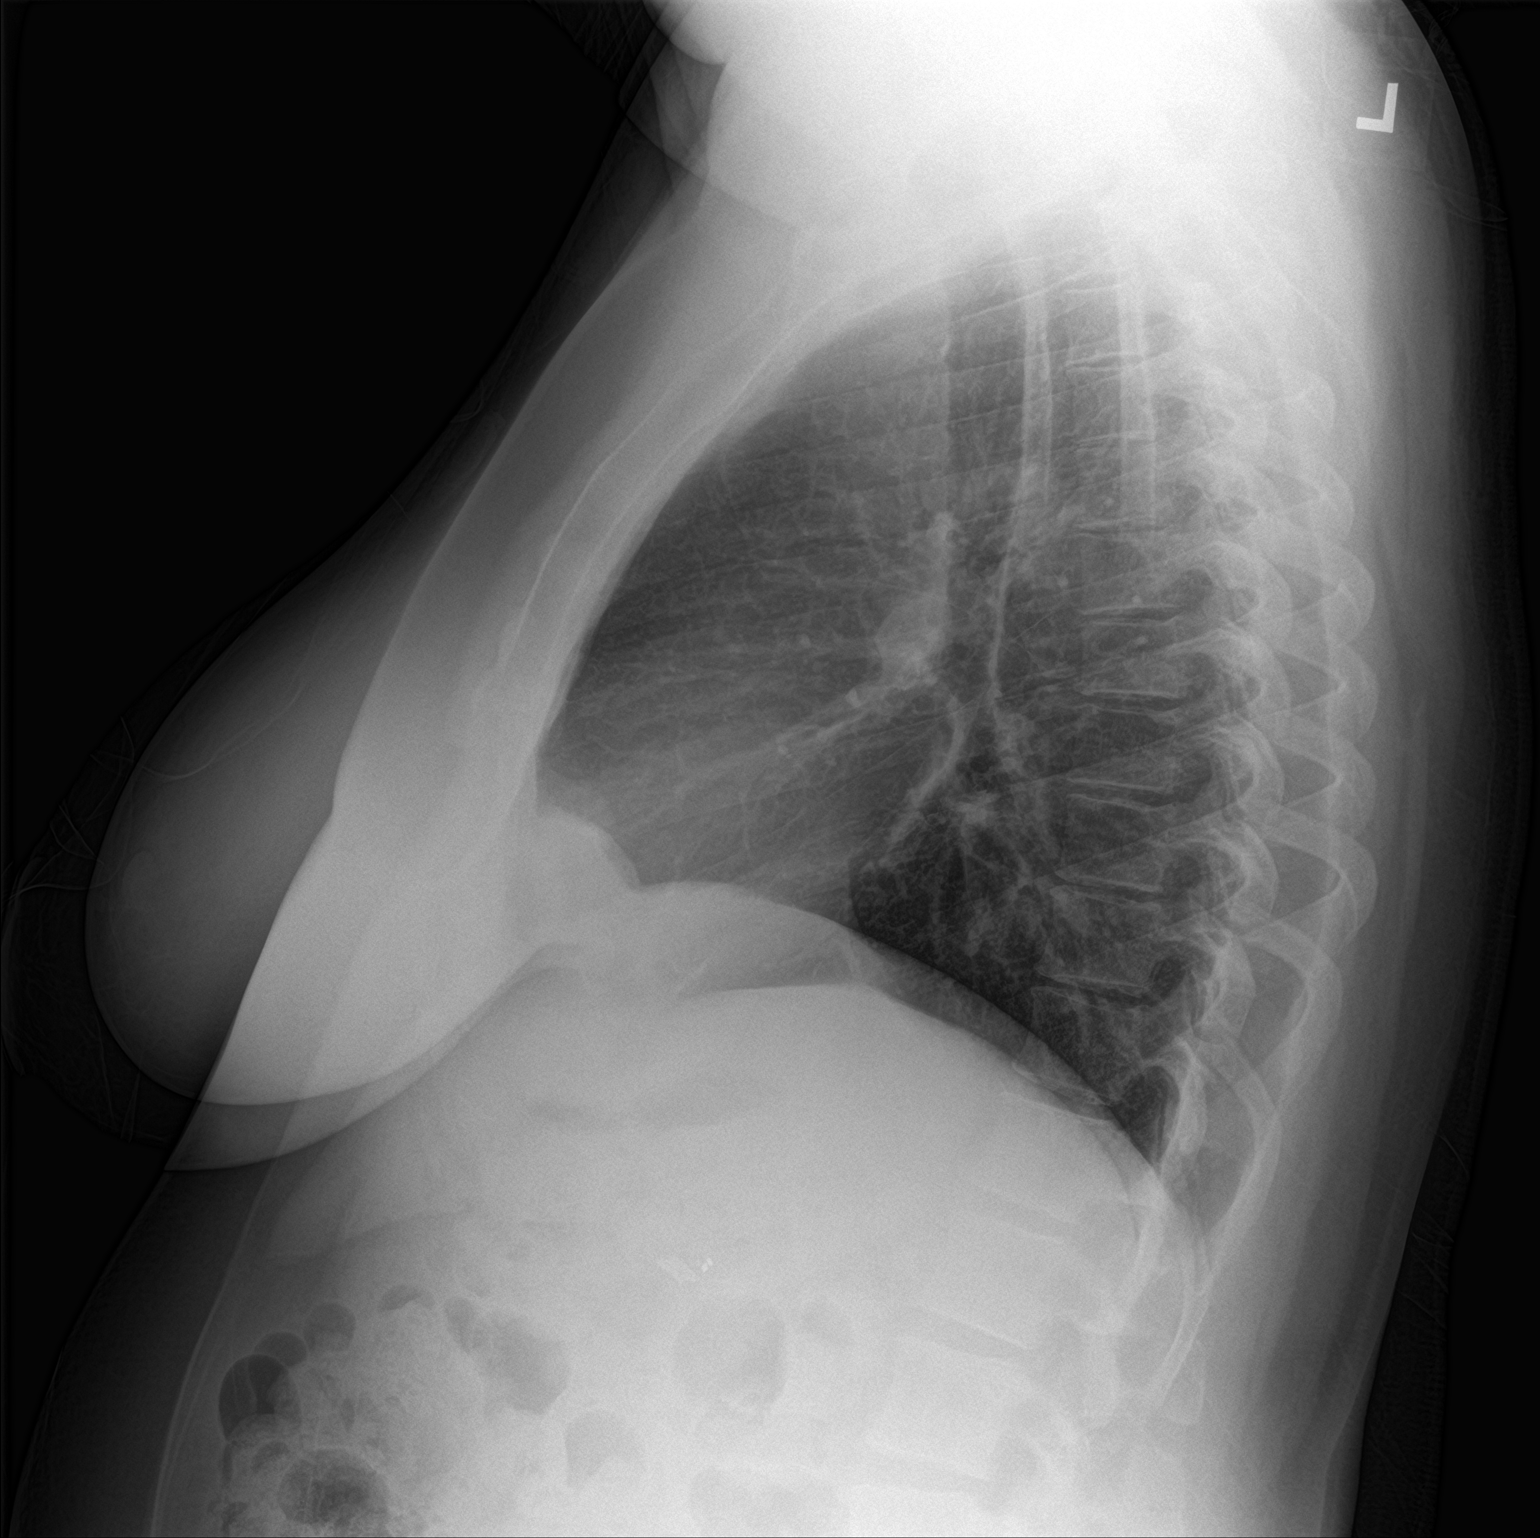

[2 of 2 positions shown; findings below may reference images not displayed]

FINDINGS: Normal sized heart. Clear lungs with normal vascularity. Mild
diffuse peribronchial thickening. Cholecystectomy clips.
Unremarkable bones.
IMPRESSION: Mild bronchitic changes.

## 2019-09-14 MED ORDER — SODIUM CHLORIDE 0.9% FLUSH: 3.0000 mL | Freq: Once | INTRAVENOUS | Status: DC

## 2019-09-14 NOTE — ED Triage Notes (Signed)
Pt presents to ED via POV, pt states pain starts in L axilla, radiates to L chest and to back. Pt states pain x 1 hr, states pain is burning in nature.

## 2019-09-14 NOTE — ED Provider Notes (Signed)
[facility] Emergency Department Provider Note ____________________________________________   First MD Initiated Contact with Patient 09/14/19 1840     (approximate)  I have reviewed the triage vital signs and the nursing notes.   HISTORY  Chief Complaint Chest Pain    HPI Morgan Stevens is a 54 y.o. female with PMH as noted below who presents with chest pain, acute onset about an hour ago, and mainly in the left axilla and radiating around to the chest and back.  She states she was at the grocery store when it started and was not doing anything strenuous.  She denies associated shortness of breath, lightheadedness, or nausea.  She was in the ED for chest pain last month but states that that was more substernal and did not feel the same.  The patient denies any significant repetitive use or recent strains or injuries.  Past Medical History:  Diagnosis Date  . Gastritis   . Hiatal hernia   . Obesity (BMI 30-39.9)   . Reflux   . Thyroid disease     Patient Active Problem List   Diagnosis Date Noted  . Stricture and stenosis of esophagus   . Columnar epithelial-lined lower esophagus   . Stomach irritation   . Gastric polyp   . Esophageal dysphagia   . Encounter for screening colonoscopy   . Diverticulosis of large intestine without diverticulitis   . Conversion disorder 06/11/2018  . Esophagitis, [place] grade A 09/06/2017  . Hiatal hernia 09/06/2017  . Schatzki's ring of distal esophagus 09/06/2017  . Other constipation 07/05/2017  . Sciatica 03/27/2017  . Pain in the chest   . Abnormal cardiac function test 02/26/2016  . Obesity (BMI 30-39.9)   . Angina pectoris (HCC) 02/25/2016  . Dyspnea 02/25/2016  . Hypothyroidism 02/25/2016  . 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 Chest pain 09/07/2015  . GERD (gastroesophageal reflux disease) 01/21/2014  . Gestational diabetes 01/21/2014  . Tonsillar hypertrophy 01/21/2014  . Nontoxic  multinodular goiter 12/24/2013    Past Surgical History:  Procedure Laterality Date  . CARDIAC CATHETERIZATION  02/27/2016   Procedure: Left Heart Cath and Coronary Angiography;  Surgeon: Corky Crafts, MD;  Location: [facility] INVASIVE CV LAB;  Service: Cardiovascular;;  . CESAREAN SECTION    . CHOLECYSTECTOMY    . COLONOSCOPY WITH PROPOFOL N/A 04/15/2019   Procedure: COLONOSCOPY WITH PROPOFOL;  Surgeon: Pasty Spillers, MD;  Location: [facility] ENDOSCOPY;  Service: Endoscopy;  Laterality: N/A;  . ESOPHAGOGASTRODUODENOSCOPY (EGD) WITH PROPOFOL N/A 04/15/2019   Procedure: ESOPHAGOGASTRODUODENOSCOPY (EGD) WITH PROPOFOL;  Surgeon: Pasty Spillers, MD;  Location: [facility] ENDOSCOPY;  Service: Endoscopy;  Laterality: N/A;  . THYROIDECTOMY    . THYROIDECTOMY      Prior to Admission medications   Medication Sig Start Date End Date Taking? Authorizing Provider  cyclobenzaprine (FLEXERIL) 10 MG tablet Take by mouth. 06/12/19   [provider]  ketorolac (TORADOL) 10 MG tablet Take 1 tablet (10 mg total) by mouth every 8 (eight) hours as needed for severe pain. 07/30/19   Phineas Semen, MD  levothyroxine (SYNTHROID, LEVOTHROID) 137 MCG tablet Take 137 mcg by mouth daily before breakfast.    [provider]  omeprazole (PRILOSEC) 20 MG capsule Take 20 mg by mouth 2 (two) times daily before a meal.    [provider]  psyllium (METAMUCIL SMOOTH TEXTURE) 58.6 % powder Take 1 packet by mouth daily. 06/24/19 09/22/19  Pasty Spillers, MD    Allergies Patient has no known  allergies.  Family History  Problem Relation Age of Onset  . Breast cancer Neg Hx     Social History Social History   Tobacco Use  . Smoking status: Never Smoker  . Smokeless tobacco: Never Used  Substance Use Topics  . Alcohol use: No    Alcohol/week: 0.0 standard drinks  . Drug use: No    Review of Systems  Constitutional: No fever. Eyes: No visual changes. ENT: No sore  throat. Cardiovascular: Positive for chest pain. Respiratory: Denies shortness of breath. Gastrointestinal: No nausea. Genitourinary: Negative for flank pain.  Musculoskeletal: Positive for back pain. Skin: Negative for rash. Neurological: Negative for headache.   ____________________________________________   PHYSICAL EXAM:  VITAL SIGNS: ED Triage Vitals  Enc Vitals Group     BP 09/14/19 1742 (!) 144/77     Pulse Rate 09/14/19 1742 80     Resp 09/14/19 1742 16     Temp 09/14/19 1742 98.3 F (36.8 C)     Temp Source 09/14/19 1742 Oral     SpO2 09/14/19 1742 98 %     Weight 09/14/19 1744 227 lb (103 kg)     Height 09/14/19 1744 5\' 4"  (1.626 m)     Head Circumference --      Peak Flow --      Pain Score 09/14/19 1753 8     Pain Loc --      Pain Edu? --      Excl. in [place]? --     Constitutional: Alert and oriented. Well appearing and in no acute distress. Eyes: Conjunctivae are normal.  Head: Atraumatic. Nose: No congestion/rhinnorhea. Mouth/Throat: Mucous membranes are moist.   Neck: Normal range of motion.  Cardiovascular: Normal rate, regular rhythm.  Good peripheral circulation. Respiratory: Normal respiratory effort.  No retractions.  Gastrointestinal:  No distention.  Musculoskeletal: No lower extremity edema.  Extremities warm and well perfused.  Reproducible left chest wall tenderness in the axilla and anterolateral chest, corresponding to the pain. Neurologic:  Normal speech and language. No gross focal neurologic deficits are appreciated.  Skin:  Skin is warm and dry. No rash noted. Psychiatric: Mood and affect are normal. Speech and behavior are normal.  ____________________________________________   LABS (all labs ordered are listed, but only abnormal results are displayed)  Labs Reviewed  BASIC METABOLIC PANEL - Abnormal; Notable for the following components:      Result Value   Glucose, Bld 122 (*)    All other components within normal limits  CBC   POC URINE PREG, ED  TROPONIN I (HIGH SENSITIVITY)  TROPONIN I (HIGH SENSITIVITY)   ____________________________________________  EKG  ED ECG REPORT I, Arta Silence, the attending physician, personally viewed and interpreted this ECG.  Date: 09/14/2019 EKG Time: 1756 Rate: 79 Rhythm: normal sinus rhythm QRS Axis: normal Intervals: normal ST/T Wave abnormalities: normal Narrative Interpretation: no evidence of acute ischemia  ____________________________________________  RADIOLOGY  CXR: No focal infiltrate or other acute abnormality  ____________________________________________   PROCEDURES  Procedure(s) performed: No  Procedures  Critical Care performed: No ____________________________________________   INITIAL IMPRESSION / ASSESSMENT AND PLAN / ED COURSE  Pertinent labs & imaging results that were available during my care of the patient were reviewed by me and considered in my medical decision making (see chart for details).  54 year old female with PMH as noted above presents with acute onset of left lateral chest wall pain which is really more in the axilla, with no significant associated symptoms.  She has  otherwise been feeling well.  I reviewed the past medical records in Epic.  The patient was seen in the ED in early December for chest pain and had a negative work-up, but states that that episode was different.  On exam today, the patient is very well-appearing.  Her vital signs are normal.  The physical exam is unremarkable except for reproducible left lateral chest wall tenderness.  EKG is nonischemic.  Overall presentation is consistent with musculoskeletal chest wall pain.  Initial troponin is negative, and the chest x-ray shows no acute findings.  We will obtain a repeat troponin after 2 hours and if negative, anticipate discharge home.  ----------------------------------------- 8:41 PM on  09/14/2019 -----------------------------------------  Repeat troponin is negative.  The patient continues to appear comfortable.  She is stable for discharge home.  Return precautions given, and she expresses understanding.  ____________________________________________   FINAL CLINICAL IMPRESSION(S) / ED DIAGNOSES  Final diagnoses:  Chest wall pain      NEW MEDICATIONS STARTED DURING THIS VISIT:  New Prescriptions   No medications on file     Note:  This document was prepared using Dragon voice recognition software and may include unintentional dictation errors.    Dionne Bucy, MD 09/14/19 2041

## 2019-09-14 NOTE — Discharge Instructions (Addendum)
Return to the ER for new, worsening, or persistent severe chest or back pain, difficulty breathing, weakness or lightheadedness, or any other new or worsening symptoms that concern you.  You may take ibuprofen or Naprosyn as needed for the pain.

## 2019-09-16 DIAGNOSIS — Z20828 Contact with and (suspected) exposure to other viral communicable diseases: Secondary | ICD-10-CM | POA: Diagnosis not present

## 2019-11-10 DIAGNOSIS — J329 Chronic sinusitis, unspecified: Secondary | ICD-10-CM | POA: Diagnosis not present

## 2019-11-15 ENCOUNTER — Other Ambulatory Visit: Payer: Self-pay

## 2019-11-15 ENCOUNTER — Emergency Department
Admission: EM | Admit: 2019-11-15 | Discharge: 2019-11-15 | Disposition: A | Discharge status: Home / Self Care | Payer: Medicaid Other | Attending: Emergency Medicine | Admitting: Emergency Medicine

## 2019-11-15 DIAGNOSIS — Z79899 Other long term (current) drug therapy: Secondary | ICD-10-CM | POA: Insufficient documentation

## 2019-11-15 DIAGNOSIS — S4992XA Unspecified injury of left shoulder and upper arm, initial encounter: Secondary | ICD-10-CM | POA: Diagnosis present

## 2019-11-15 DIAGNOSIS — Y9389 Activity, other specified: Secondary | ICD-10-CM | POA: Insufficient documentation

## 2019-11-15 DIAGNOSIS — Y990 Civilian activity done for income or pay: Secondary | ICD-10-CM | POA: Insufficient documentation

## 2019-11-15 DIAGNOSIS — S46912A Strain of unspecified muscle, fascia and tendon at shoulder and upper arm level, left arm, initial encounter: Secondary | ICD-10-CM | POA: Diagnosis not present

## 2019-11-15 DIAGNOSIS — E039 Hypothyroidism, unspecified: Secondary | ICD-10-CM | POA: Diagnosis not present

## 2019-11-15 DIAGNOSIS — Y9289 Other specified places as the place of occurrence of the external cause: Secondary | ICD-10-CM | POA: Insufficient documentation

## 2019-11-15 DIAGNOSIS — X500XXA Overexertion from strenuous movement or load, initial encounter: Secondary | ICD-10-CM | POA: Insufficient documentation

## 2019-11-15 MED ORDER — KETOROLAC TROMETHAMINE 30 MG/ML IJ SOLN
30.0000 mg | Freq: Once | INTRAMUSCULAR | Status: AC
  Administered 2019-11-15: 30 mg via INTRAMUSCULAR
  Filled 2019-11-15: qty 1

## 2019-11-15 MED ORDER — CYCLOBENZAPRINE HCL 5 MG PO TABS: 5.0000 mg | ORAL_TABLET | Freq: Three times a day (TID) | ORAL | 0 refills | Status: DC | PRN

## 2019-11-15 MED ORDER — CYCLOBENZAPRINE HCL 10 MG PO TABS
10.0000 mg | ORAL_TABLET | Freq: Once | ORAL | Status: AC
  Administered 2019-11-15: 10 mg via ORAL
  Filled 2019-11-15: qty 1

## 2019-11-15 MED ORDER — KETOROLAC TROMETHAMINE 10 MG PO TABS: 10.0000 mg | ORAL_TABLET | Freq: Three times a day (TID) | ORAL | 0 refills | Status: DC

## 2019-11-15 NOTE — ED Notes (Signed)
See triage note  Presents with pain to left shoulder  States she felt a pop when she was reaching with same arm   Describes pain as burning and moving into neck

## 2019-11-15 NOTE — Discharge Instructions (Addendum)
Your exam is consistent with a muscle strain and shoulder strain. Your do not have any signs of a rotator cuff injury. You should take the prescription muscle relaxant and anti-inflammatory as directed. Apply ice and/or moist heat as needed. Follow-up with your provider for ongoing symptoms. Return if needed.

## 2019-11-15 NOTE — ED Triage Notes (Signed)
Pt A&O, ambulatory. States L shoulder and L neck pain x 1 hour. Denies sore throat, denies cough, congestion, fever. Denies injury.

## 2019-11-15 NOTE — ED Provider Notes (Signed)
[facility] Emergency Department Provider Note ____________________________________________  Time seen: 1335  I have reviewed the triage vital signs and the nursing notes.  HISTORY  Chief Complaint  Shoulder Pain and Neck Pain  HPI Morgan Stevens is a 54 y.o. female presents herself to the ED for evaluation of acute left shoulder pain.  Patient describes she was at work, and reached forward to pick up her purse, when she felt the pop to her anterior left shoulder.  Since time she said pain across the anterior shoulder and deltoid region.  She denies any frank chest pain, shortness of breath, or diaphoresis.  She describes  some of her pain into the left lateral neck.  She took 2 Tylenol prior to arrival for pain.  She denies any distal paresthesias, grip changes, or weakness.  Pain is aggravated with active range of motion of the shoulder patient denies any history of chronic ongoing shoulder problems.  Past Medical History:  Diagnosis Date  . Gastritis   . Hiatal hernia   . Obesity (BMI 30-39.9)   . Reflux   . Thyroid disease     Patient Active Problem List   Diagnosis Date Noted  . Stricture and stenosis of esophagus   . Columnar epithelial-lined lower esophagus   . Stomach irritation   . Gastric polyp   . Esophageal dysphagia   . Encounter for screening colonoscopy   . Diverticulosis of large intestine without diverticulitis   . Conversion disorder 06/11/2018  . Esophagitis, [place] grade A 09/06/2017  . Hiatal hernia 09/06/2017  . Schatzki's ring of distal esophagus 09/06/2017  . Other constipation 07/05/2017  . Sciatica 03/27/2017  . Pain in the chest   . Abnormal cardiac function test 02/26/2016  . Obesity (BMI 30-39.9)   . Angina pectoris (HCC) 02/25/2016  . Dyspnea 02/25/2016  . Hypothyroidism 02/25/2016  . 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 Chest pain 09/07/2015  . GERD (gastroesophageal reflux disease) 01/21/2014  . Gestational  diabetes 01/21/2014  . Tonsillar hypertrophy 01/21/2014  . 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 CARDIAC CATHETERIZATION  02/27/2016   Procedure: Left Heart Cath and Coronary Angiography;  Surgeon: Corky Crafts, MD;  Location: [facility] INVASIVE CV LAB;  Service: Cardiovascular;;  . CESAREAN SECTION    . CHOLECYSTECTOMY    . COLONOSCOPY WITH PROPOFOL N/A 04/15/2019   Procedure: COLONOSCOPY WITH PROPOFOL;  Surgeon: Pasty Spillers, MD;  Location: [facility] ENDOSCOPY;  Service: Endoscopy;  Laterality: N/A;  . ESOPHAGOGASTRODUODENOSCOPY (EGD) WITH PROPOFOL N/A 04/15/2019   Procedure: ESOPHAGOGASTRODUODENOSCOPY (EGD) WITH PROPOFOL;  Surgeon: Pasty Spillers, MD;  Location: [facility] ENDOSCOPY;  Service: Endoscopy;  Laterality: N/A;  . THYROIDECTOMY    . THYROIDECTOMY      Prior to Admission medications   Medication Sig Start Date End Date Taking? Authorizing Provider  cyclobenzaprine (FLEXERIL) 5 MG tablet Take 1 tablet (5 mg total) by mouth 3 (three) times daily as needed. 11/15/19   Areliz Rothman, Charlesetta Ivory, PA-C  ketorolac (TORADOL) 10 MG tablet Take 1 tablet (10 mg total) by mouth every 8 (eight) hours. 11/15/19   Jarron Curley, Charlesetta Ivory, PA-C  levothyroxine (SYNTHROID, LEVOTHROID) 137 MCG tablet Take 137 mcg by mouth daily before breakfast.    [provider]  omeprazole (PRILOSEC) 20 MG capsule Take 20 mg by mouth 2 (two) times daily before a meal.    [provider]    Allergies Patient has no known allergies.  Family History  Problem Relation Age of Onset  . Breast cancer Neg Hx     Social History Social History   Tobacco Use  . Smoking status: Never Smoker  . Smokeless tobacco: Never Used  Substance Use Topics  . Alcohol use: No    Alcohol/week: 0.0 standard drinks  . Drug use: No    Review of Systems  Constitutional: Negative for fever. Cardiovascular: Negative for chest pain. Respiratory:  Negative for shortness of breath. Musculoskeletal: Negative for back pain.  Left shoulder pain as above. Skin: Negative for rash. Neurological: Negative for headaches, focal weakness or numbness. ____________________________________________  PHYSICAL EXAM:  VITAL SIGNS: ED Triage Vitals  Enc Vitals Group     BP 11/15/19 1243 (!) 152/84     Pulse Rate 11/15/19 1243 76     Resp 11/15/19 1243 15     Temp 11/15/19 1243 98.1 F (36.7 C)     Temp Source 11/15/19 1243 Oral     SpO2 11/15/19 1243 99 %     Weight 11/15/19 1243 220 lb (99.8 kg)     Height 11/15/19 1243 5\' 4"  (1.626 m)     Head Circumference --      Peak Flow --      Pain Score 11/15/19 1247 8     Pain Loc --      Pain Edu? --      Excl. in [place]? --     Constitutional: Alert and oriented. Well appearing and in no distress. Head: Normocephalic and atraumatic. Eyes: Conjunctivae are normal. Normal extraocular movements Neck: Supple.  Normal range of motion without crepitus.  No distracting midline tenderness is noted.  Hematological/Lymphatic/Immunological: No cervical lymphadenopathy. Cardiovascular: Normal rate, regular rhythm. Normal distal pulses. Respiratory: Normal respiratory effort. No wheezes/rales/rhonchi. Musculoskeletal: Left shoulder without any obvious deformity, dislocation, or sulcus sign.  Patient with full active range of motion left shoulder without difficulty.  Normal internal and external rotation is noted.  No rotator cuff deficits are appreciated.  Normal composite fist distally.  Nontender with normal range of motion in all extremities.  Neurologic: Cranial nerves II through XII grossly intact.  Normal UV DTRs bilaterally.  Normal gait without ataxia. Normal speech and language. No gross focal neurologic deficits are appreciated. Skin:  Skin is warm, dry and intact. No rash noted. ____________________________________________  PROCEDURES  Toradol 30 mg IM Flexeril 10 mg  p.o.  Procedures ____________________________________________  INITIAL IMPRESSION / ASSESSMENT AND PLAN / ED COURSE  Patient with ED evaluation of acute left shoulder strain and myalgias.  Clinical patient's overall benign return at this time.  No indication of any acute rotator cuff deficit or mechanism to concern ourselves for an acute derangement of the shoulder joint.  Patient is without rotator cuff deficit on exam.  She reports improvement of her pain after ED medication administration.  She will be discharged with prescriptions for ketorolac and Flexeril take as directed.  Follow-up with primary provider or return to the ED as needed.  Morgan Stevens was evaluated in Emergency Department on 11/15/2019 for the symptoms described in the history of present illness. She was evaluated in the context of the global COVID-19 pandemic, which necessitated consideration that the patient might be at risk for infection with the SARS-CoV-2 virus that causes COVID-19. Institutional protocols and algorithms that pertain to the evaluation of patients at risk for COVID-19 are in a state of rapid change based on information released by regulatory bodies including the CDC and federal and state  organizations. These policies and algorithms were followed during the patient's care in the ED. ____________________________________________  FINAL CLINICAL IMPRESSION(S) / ED DIAGNOSES  Final diagnoses:  Strain of left shoulder, initial encounter      Melvenia Needles, PA-C 11/15/19 2134    Earleen Newport, MD 11/18/19 [id number]

## 2019-11-19 ENCOUNTER — Emergency Department
Admission: EM | Admit: 2019-11-19 | Discharge: 2019-11-20 | Disposition: A | Discharge status: Home / Self Care | Payer: Medicaid Other | Attending: Emergency Medicine | Admitting: Emergency Medicine

## 2019-11-19 ENCOUNTER — Emergency Department: Payer: Medicaid Other

## 2019-11-19 ENCOUNTER — Other Ambulatory Visit: Payer: Self-pay

## 2019-11-19 DIAGNOSIS — R0602 Shortness of breath: Secondary | ICD-10-CM | POA: Diagnosis not present

## 2019-11-19 DIAGNOSIS — R079 Chest pain, unspecified: Secondary | ICD-10-CM

## 2019-11-19 DIAGNOSIS — E039 Hypothyroidism, unspecified: Secondary | ICD-10-CM | POA: Insufficient documentation

## 2019-11-19 DIAGNOSIS — R0789 Other chest pain: Secondary | ICD-10-CM | POA: Insufficient documentation

## 2019-11-19 DIAGNOSIS — Z79899 Other long term (current) drug therapy: Secondary | ICD-10-CM | POA: Insufficient documentation

## 2019-11-19 LAB — BASIC METABOLIC PANEL
Anion gap: 10 (ref 5–15)
BUN: 17 mg/dL (ref 6–20)
CO2: 24 mmol/L (ref 22–32)
Calcium: 9.4 mg/dL (ref 8.9–10.3)
Chloride: 103 mmol/L (ref 98–111)
Creatinine, Ser: 0.86 mg/dL (ref 0.44–1.00)
GFR calc Af Amer: >60 mL/min (ref >60)
GFR calc non Af Amer: >60 mL/min (ref >60)
Glucose, Bld: 100 mg/dL — ABNORMAL HIGH (ref 70–99)
Potassium: 3.5 mmol/L (ref 3.5–5.1)
Sodium: 137 mmol/L (ref 135–145)

## 2019-11-19 LAB — CBC
HCT: 36.3 % (ref 36.0–46.0)
Hemoglobin: 12.8 g/dL (ref 12.0–15.0)
MCH: 31.3 pg (ref 26.0–34.0)
MCHC: 35.3 g/dL (ref 30.0–36.0)
MCV: 88.8 fL (ref 80.0–100.0)
Platelets: 306 10*3/uL (ref 150–400)
RBC: 4.09 MIL/uL (ref 3.87–5.11)
RDW: 12.4 % (ref 11.5–15.5)
WBC: 6.4 10*3/uL (ref 4.0–10.5)
nRBC: 0 % (ref 0.0–0.2)

## 2019-11-19 LAB — TROPONIN I (HIGH SENSITIVITY): Troponin I (High Sensitivity): 3 ng/L (ref <18)

## 2019-11-19 IMAGING — CR DG CHEST 2V
2 series · 2 of 2 positions shown · non-contrast
Comparison: Chest radiographs [DATE] and earlier.

CLINICAL DATA: 53-year-old female with chest pain since [4I] hours.

EXAM:
CHEST - 2 VIEW

[chest pa]
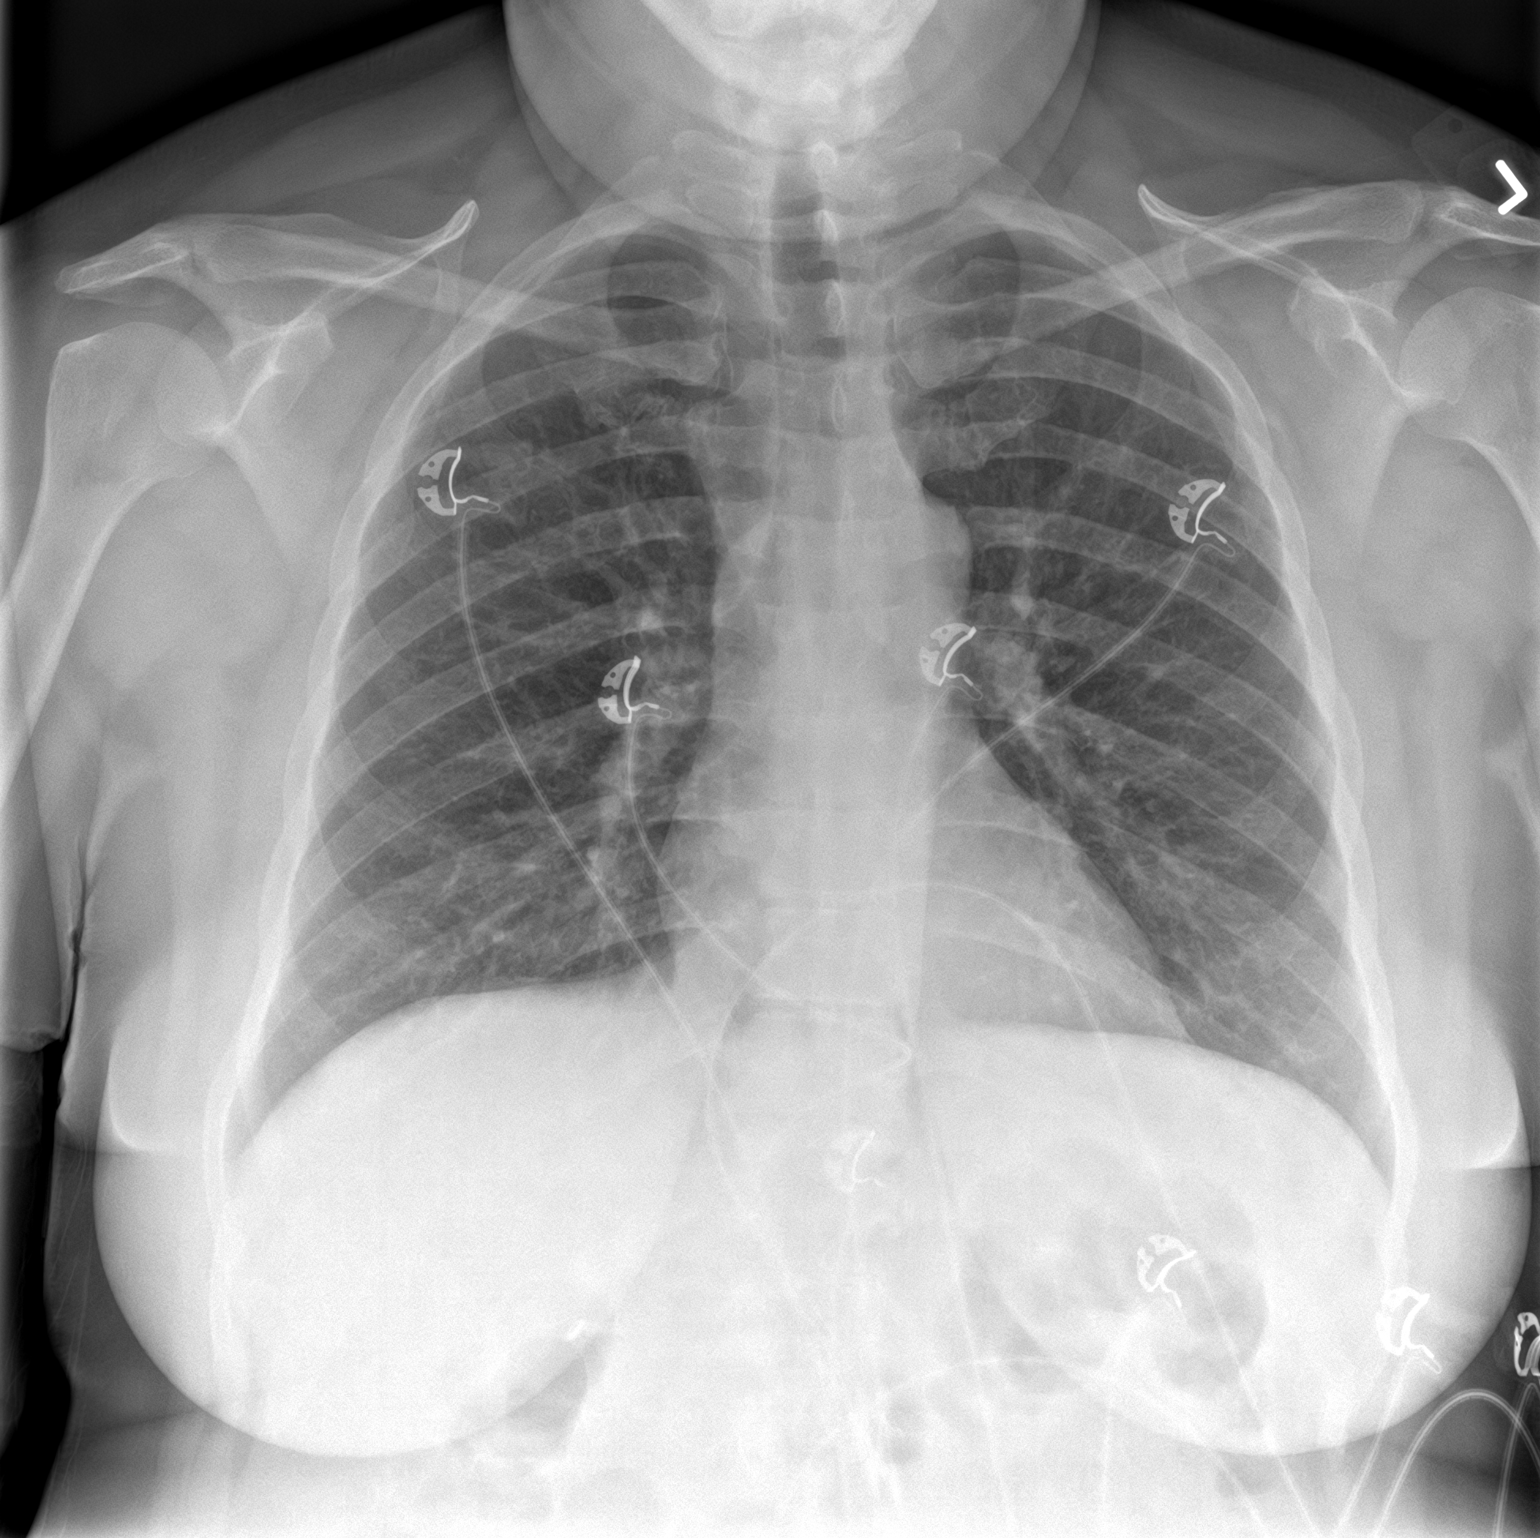

[chest lat]
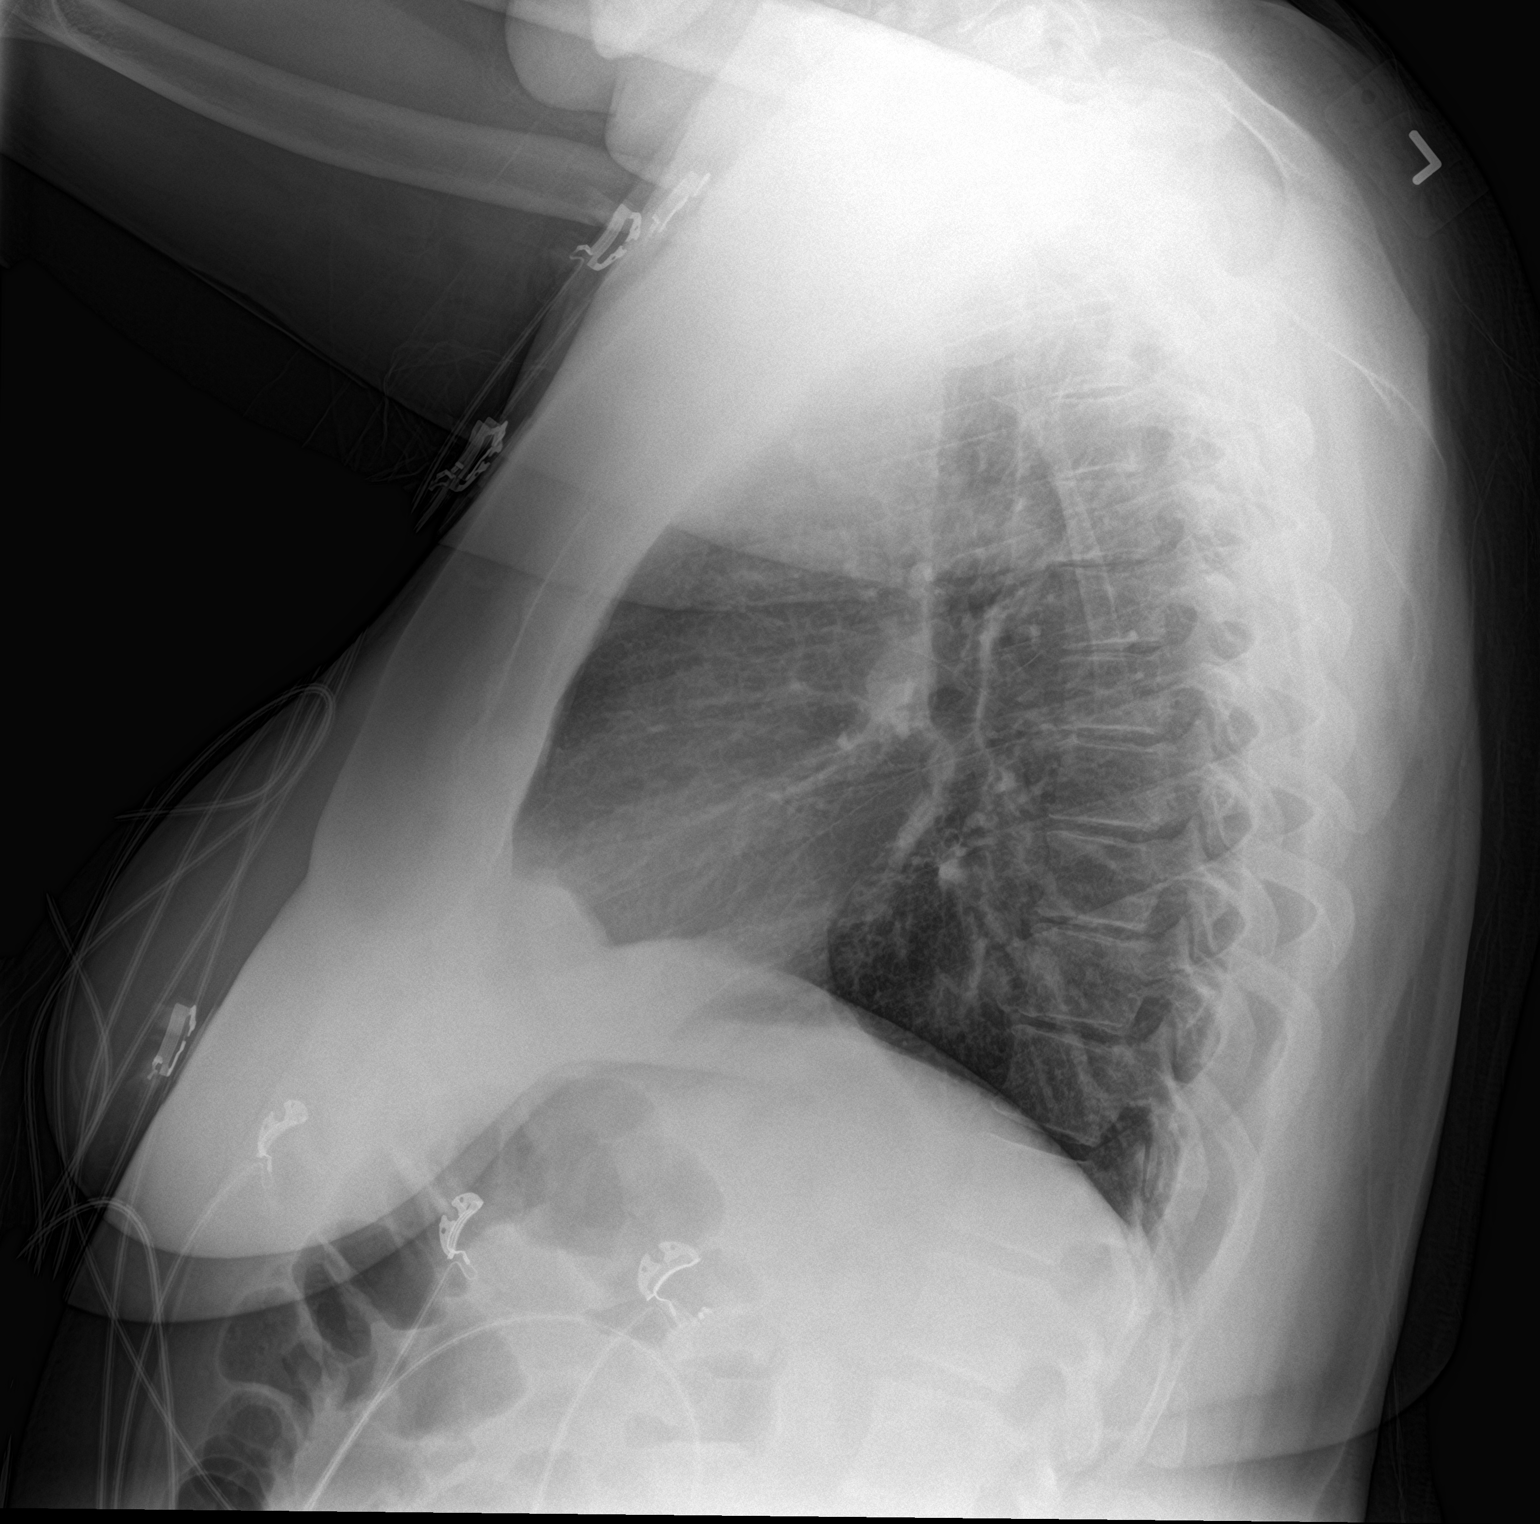

[2 of 2 positions shown; findings below may reference images not displayed]

FINDINGS: Lung volumes and mediastinal contours remain normal. Visualized
tracheal air column is within normal limits. Lungs are stable in
clear. No pneumothorax or pleural effusion. No acute osseous
abnormality identified. Stable cholecystectomy clips. Negative
visible bowel gas pattern.
IMPRESSION: Stable and negative.  No cardiopulmonary abnormality.

## 2019-11-19 NOTE — ED Provider Notes (Signed)
[facility] Emergency Department Provider Note   ____________________________________________   First MD Initiated Contact with Patient 11/19/19 2306     (approximate)  I have reviewed the triage vital signs and the nursing notes.   HISTORY  Chief Complaint Chest Pain    HPI Morgan Stevens is a 54 y.o. female brought to the ED via EMS from home with a chief complaint of chest pain.  Patient reports substernal chest tightness starting around 9 PM while she was watching TV.  Symptoms associated with mild shortness of breath.  Denies associated diaphoresis, palpitations, nausea/vomiting or dizziness.  Patient took 324 mg aspirin and was given 1 spray of Nitropaste prior to arrival.  At the time of our interview, she is chest pain-free.  Patient has no cardiac history; She had a clean cardiac cath in 02/2016.  Does have a history of hiatal hernia, gastritis, esophageal dysphagia.  Ate a salad for dinner.  Denies fever, cough, abdominal pain, diarrhea.  Denies recent travel, trauma or hormone use.       Past Medical History:  Diagnosis Date  . Gastritis   . Hiatal hernia   . Obesity (BMI 30-39.9)   . Reflux   . Thyroid disease     Patient Active Problem List   Diagnosis Date Noted  . Stricture and stenosis of esophagus   . Columnar epithelial-lined lower esophagus   . Stomach irritation   . Gastric polyp   . Esophageal dysphagia   . Encounter for screening colonoscopy   . Diverticulosis of large intestine without diverticulitis   . Conversion disorder 06/11/2018  . Esophagitis, [place] grade A 09/06/2017  . Hiatal hernia 09/06/2017  . Schatzki's ring of distal esophagus 09/06/2017  . Other constipation 07/05/2017  . Sciatica 03/27/2017  . Pain in the chest   . Abnormal cardiac function test 02/26/2016  . Obesity (BMI 30-39.9)   . Angina pectoris (HCC) 02/25/2016  . Dyspnea 02/25/2016  . Hypothyroidism 02/25/2016  . Postoperative  hypothyroidism 02/25/2016  . Chest pain 09/07/2015  . GERD (gastroesophageal reflux disease) 01/21/2014  . Gestational diabetes 01/21/2014  . Tonsillar hypertrophy 01/21/2014  . 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 CARDIAC CATHETERIZATION  02/27/2016   Procedure: Left Heart Cath and Coronary Angiography;  Surgeon: Corky Crafts, MD;  Location: [facility] INVASIVE CV LAB;  Service: Cardiovascular;;  . CESAREAN SECTION    . CHOLECYSTECTOMY    . COLONOSCOPY WITH PROPOFOL N/A 04/15/2019   Procedure: COLONOSCOPY WITH PROPOFOL;  Surgeon: Pasty Spillers, MD;  Location: [facility] ENDOSCOPY;  Service: Endoscopy;  Laterality: N/A;  . ESOPHAGOGASTRODUODENOSCOPY (EGD) WITH PROPOFOL N/A 04/15/2019   Procedure: ESOPHAGOGASTRODUODENOSCOPY (EGD) WITH PROPOFOL;  Surgeon: Pasty Spillers, MD;  Location: [facility] ENDOSCOPY;  Service: Endoscopy;  Laterality: N/A;  . THYROIDECTOMY    . THYROIDECTOMY      Prior to Admission medications   Medication Sig Start Date End Date Taking? Authorizing Provider  cyclobenzaprine (FLEXERIL) 5 MG tablet Take 1 tablet (5 mg total) by mouth 3 (three) times daily as needed. 11/15/19   Menshew, Charlesetta Ivory, PA-C  ketorolac (TORADOL) 10 MG tablet Take 1 tablet (10 mg total) by mouth every 8 (eight) hours. 11/15/19   Menshew, Charlesetta Ivory, PA-C  levothyroxine (SYNTHROID, LEVOTHROID) 137 MCG tablet Take 137 mcg by mouth daily before breakfast.    [provider]  omeprazole (PRILOSEC) 20 MG capsule Take 20 mg by mouth 2 (  two) times daily before a meal.    [provider]    Allergies Patient has no known allergies.  Family History  Problem Relation Age of Onset  . Breast cancer Neg Hx     Social History Social History   Tobacco Use  . Smoking status: Never Smoker  . Smokeless tobacco: Never Used  Substance Use Topics  . Alcohol use: No    Alcohol/week: 0.0 standard drinks  . Drug use: No     Review of Systems  Constitutional: No fever/chills Eyes: No visual changes. ENT: No sore throat. Cardiovascular: Positive for chest pain. Respiratory: Denies shortness of breath. Gastrointestinal: No abdominal pain.  No nausea, no vomiting.  No diarrhea.  No constipation. Genitourinary: Negative for dysuria. Musculoskeletal: Negative for back pain. Skin: Negative for rash. Neurological: Negative for headaches, focal weakness or numbness.   ____________________________________________   PHYSICAL EXAM:  VITAL SIGNS: ED Triage Vitals  Enc Vitals Group     BP 11/19/19 2222 116/77     Pulse Rate 11/19/19 2222 83     Resp --      Temp 11/19/19 2222 98.6 F (37 C)     Temp Source 11/19/19 2222 Oral     SpO2 11/19/19 2222 97 %     Weight 11/19/19 2223 219 lb (99.3 kg)     Height 11/19/19 2223 5\' 4"  (1.626 m)     Head Circumference --      Peak Flow --      Pain Score 11/19/19 2223 5     Pain Loc --      Pain Edu? --      Excl. in [place]? --     Constitutional: Alert and oriented. Well appearing and in no acute distress. Eyes: Conjunctivae are normal. PERRL. EOMI. Head: Atraumatic. Nose: No congestion/rhinnorhea. Mouth/Throat: Mucous membranes are moist.  Oropharynx non-erythematous. Neck: No stridor.   Cardiovascular: Normal rate, regular rhythm. Grossly normal heart sounds.  Good peripheral circulation. Respiratory: Normal respiratory effort.  No retractions. Lungs CTAB. Gastrointestinal: Soft and nontender to light or deep palpation. No distention. No abdominal bruits. No CVA tenderness. Musculoskeletal: No lower extremity tenderness nor edema.  No joint effusions. Neurologic:  Normal speech and language. No gross focal neurologic deficits are appreciated. No gait instability. Skin:  Skin is warm, dry and intact. No rash noted. Psychiatric: Mood and affect are normal. Speech and behavior are normal.  ____________________________________________   LABS (all labs  ordered are listed, but only abnormal results are displayed)  Labs Reviewed  BASIC METABOLIC PANEL - Abnormal; Notable for the following components:      Result Value   Glucose, Bld 100 (*)    All other components within normal limits  HEPATIC FUNCTION PANEL - Abnormal; Notable for the following components:   ALT 51 (*)    Alkaline Phosphatase 27 (*)    All other components within normal limits  CBC  LIPASE, BLOOD  POC URINE PREG, ED  TROPONIN I (HIGH SENSITIVITY)  TROPONIN I (HIGH SENSITIVITY)   ____________________________________________  EKG  ED ECG REPORT I, Karstyn Birkey J, the attending physician, personally viewed and interpreted this ECG.   Date: 11/19/2019  EKG Time: 2224  Rate: 87  Rhythm: normal EKG, normal sinus rhythm  Axis: Normal  Intervals:none  ST&T Change: Nonspecific  ____________________________________________  RADIOLOGY  ED MD interpretation: No acute cardiopulmonary process  Official radiology report(s): DG Chest 2 View  Result Date: 11/19/2019 CLINICAL DATA:  54 year old female with chest pain since  2100 hours. EXAM: CHEST - 2 VIEW COMPARISON:  Chest radiographs 09/14/2019 and earlier. FINDINGS: Lung volumes and mediastinal contours remain normal. Visualized tracheal air column is within normal limits. Lungs are stable in clear. No pneumothorax or pleural effusion. No acute osseous abnormality identified. Stable cholecystectomy clips. Negative visible bowel gas pattern. IMPRESSION: Stable and negative.  No cardiopulmonary abnormality. Electronically Signed   By: Odessa Fleming M.D.   On: 11/19/2019 22:47    ____________________________________________   PROCEDURES  Procedure(s) performed (including Critical Care):  .1-3 Lead EKG Interpretation Performed by: Irean Hong, MD Authorized by: Irean Hong, MD     Interpretation: normal     ECG rate:  85   ECG rate assessment: normal     Rhythm: sinus rhythm     Ectopy: none     Conduction: normal        ____________________________________________   INITIAL IMPRESSION / ASSESSMENT AND PLAN / ED COURSE  As part of my medical decision making, I reviewed the following data within the electronic MEDICAL RECORD NUMBER Nursing notes reviewed and incorporated, Labs reviewed, EKG interpreted, Old chart reviewed, Radiograph reviewed and Notes from prior ED visits     Morgan Stevens was evaluated in Emergency Department on 11/20/2019 for the symptoms described in the history of present illness. She was evaluated in the context of the global COVID-19 pandemic, which necessitated consideration that the patient might be at risk for infection with the SARS-CoV-2 virus that causes COVID-19. Institutional protocols and algorithms that pertain to the evaluation of patients at risk for COVID-19 are in a state of rapid change based on information released by regulatory bodies including the CDC and federal and state organizations. These policies and algorithms were followed during the patient's care in the ED.    54 year old female presenting with chest pain; normal cardiac cath in 2017. Differential diagnosis includes, but is not limited to, ACS, aortic dissection, pulmonary embolism, cardiac tamponade, pneumothorax, pneumonia, pericarditis, myocarditis, GI-related causes including esophagitis/gastritis, and musculoskeletal chest wall pain.    Initial EKG and troponin unremarkable.  Will check repeat troponin, LFTs/lipase and reassess.  Patient is currently chest pain-free and is in no acute distress.   Clinical Course as of Nov 19 636  Fri Nov 20, 2019  0050 Updated patient on negative repeat troponin.  Voices no complaints currently.  I did suggest she could try adding OTC Pepcid in addition to the Prilosec she is already taking.  Will refer to cardiology for outpatient follow-up.  Strict return precautions given.  Patient verbalizes understanding agrees with plan of care.   [JS]    Clinical Course User  Index [JS] Irean Hong, MD     ____________________________________________   FINAL CLINICAL IMPRESSION(S) / ED DIAGNOSES  Final diagnoses:  Nonspecific chest pain     ED Discharge Orders    None       Note:  This document was prepared using Dragon voice recognition software and may include unintentional dictation errors.   Irean Hong, MD 11/20/19 [phone]

## 2019-11-19 NOTE — ED Triage Notes (Signed)
Pt from home, chest pain starting around 9 pm, no cardiac history per pt. Pt with SOB, chest pain 8/10. EMS gave 1 spray of nitro, pain decreased to 5/10. Pt took 324 mg of aspirin at home. Pt denies pain radiation, and deniesN/V

## 2019-11-20 LAB — LIPASE, BLOOD: Lipase: 26 U/L (ref 11–51)

## 2019-11-20 LAB — POC URINE PREG, ED: Preg Test, Ur: NEGATIVE

## 2019-11-20 LAB — HEPATIC FUNCTION PANEL
ALT: 51 U/L — ABNORMAL HIGH (ref 0–44)
AST: 29 U/L (ref 15–41)
Albumin: 4.2 g/dL (ref 3.5–5.0)
Alkaline Phosphatase: 27 U/L — ABNORMAL LOW (ref 38–126)
Bilirubin, Direct: <0.1 mg/dL (ref 0.0–0.2)
Total Bilirubin: 0.8 mg/dL (ref 0.3–1.2)
Total Protein: 7.3 g/dL (ref 6.5–8.1)

## 2019-11-20 LAB — TROPONIN I (HIGH SENSITIVITY): Troponin I (High Sensitivity): 3 ng/L (ref <18)

## 2019-11-20 MED ORDER — FAMOTIDINE IN NACL 20-0.9 MG/50ML-% IV SOLN
20.0000 mg | Freq: Once | INTRAVENOUS | Status: AC
  Administered 2019-11-20: 20 mg via INTRAVENOUS
  Filled 2019-11-20: qty 50

## 2019-11-20 NOTE — Discharge Instructions (Addendum)
1.  You may add OTC Pepcid to the Prilosec you are already taking. 2.  Return to the ER for worsening symptoms, persistent vomiting, difficulty breathing or other concerns.

## 2019-11-22 ENCOUNTER — Other Ambulatory Visit: Payer: Self-pay

## 2019-11-22 ENCOUNTER — Encounter (HOSPITAL_COMMUNITY): Payer: Self-pay

## 2019-11-22 ENCOUNTER — Emergency Department (HOSPITAL_COMMUNITY): Payer: Medicaid Other

## 2019-11-22 ENCOUNTER — Emergency Department (HOSPITAL_COMMUNITY)
Admission: EM | Admit: 2019-11-22 | Discharge: 2019-11-22 | Disposition: A | Discharge status: Home / Self Care | Payer: Medicaid Other | Attending: Emergency Medicine | Admitting: Emergency Medicine

## 2019-11-22 DIAGNOSIS — Z79899 Other long term (current) drug therapy: Secondary | ICD-10-CM | POA: Diagnosis not present

## 2019-11-22 DIAGNOSIS — R0602 Shortness of breath: Secondary | ICD-10-CM | POA: Diagnosis not present

## 2019-11-22 DIAGNOSIS — E039 Hypothyroidism, unspecified: Secondary | ICD-10-CM | POA: Diagnosis not present

## 2019-11-22 DIAGNOSIS — R0789 Other chest pain: Secondary | ICD-10-CM

## 2019-11-22 DIAGNOSIS — R079 Chest pain, unspecified: Secondary | ICD-10-CM | POA: Diagnosis not present

## 2019-11-22 LAB — CBC
HCT: 37.6 % (ref 36.0–46.0)
Hemoglobin: 13.2 g/dL (ref 12.0–15.0)
MCH: 31.2 pg (ref 26.0–34.0)
MCHC: 35.1 g/dL (ref 30.0–36.0)
MCV: 88.9 fL (ref 80.0–100.0)
Platelets: 310 10*3/uL (ref 150–400)
RBC: 4.23 MIL/uL (ref 3.87–5.11)
RDW: 12.5 % (ref 11.5–15.5)
WBC: 5.1 10*3/uL (ref 4.0–10.5)
nRBC: 0 % (ref 0.0–0.2)

## 2019-11-22 LAB — BASIC METABOLIC PANEL
Anion gap: 11 (ref 5–15)
BUN: 16 mg/dL (ref 6–20)
CO2: 23 mmol/L (ref 22–32)
Calcium: 9.4 mg/dL (ref 8.9–10.3)
Chloride: 106 mmol/L (ref 98–111)
Creatinine, Ser: 0.87 mg/dL (ref 0.44–1.00)
GFR calc Af Amer: >60 mL/min (ref >60)
GFR calc non Af Amer: >60 mL/min (ref >60)
Glucose, Bld: 105 mg/dL — ABNORMAL HIGH (ref 70–99)
Potassium: 3.5 mmol/L (ref 3.5–5.1)
Sodium: 140 mmol/L (ref 135–145)

## 2019-11-22 LAB — I-STAT BETA HCG BLOOD, ED (MC, WL, AP ONLY): I-stat hCG, quantitative: <5 m[IU]/mL (ref <5)

## 2019-11-22 LAB — TROPONIN I (HIGH SENSITIVITY)
Troponin I (High Sensitivity): 3 ng/L (ref <18)
Troponin I (High Sensitivity): <2 ng/L (ref <18)

## 2019-11-22 NOTE — ED Triage Notes (Signed)
Pt from home; c/o CP that began around 0500 this am, woke her out of sleep; left sided, radiating to back, heavy, burning; accompanied but sob; states she did have nausea, but it has resolved; endorses diaphoresis earlier; pt warm and dry presently; nothing makes pain better or worse

## 2019-11-22 NOTE — ED Notes (Signed)
Pt to xray

## 2019-11-22 NOTE — Discharge Instructions (Addendum)

## 2019-11-22 NOTE — ED Provider Notes (Signed)
[facility] EMERGENCY DEPARTMENT Provider Note   CSN: [id number] Arrival date & time: 11/22/19  2440     History Chief Complaint  Patient presents with  . Chest Pain  . Shortness of Breath    Morgan Stevens is a 54 y.o. female.  HPI  HPI: A 54 year old patient with a history of obesity presents for evaluation of chest pain. Initial onset of pain was less than one hour ago. The patient's chest pain is not worse with exertion. The patient complains of nausea. The patient's chest pain is middle- or left-sided, is not well-localized, is not described as heaviness/pressure/tightness, is not sharp and does not radiate to the arms/jaw/neck. The patient denies diaphoresis. The patient has no history of stroke, has no history of peripheral artery disease, has not smoked in the past 90 days, denies any history of treated diabetes, has no relevant family history of coronary artery disease (first degree relative at less than age 7), is not hypertensive and has no history of hypercholesterolemia.    54 year old female with a history of esophageal reflux, Schatzki's ring, morbid obesity who presents with chest pain.  Patient has a history of chronic recurrent chest pain ER visits without diagnosed coronary artery disease.  The patient was seen 3 days ago at [facility] emergency department with complaint of chest pain without evidence of emergent cause.  She was seen 7 days ago at the emergency department and diagnosed with left shoulder strain.  Patient states that around 5 AM she awoke with left-sided chest pain which she describes as burning, coming and going, lasting for minutes at a time, radiates to her back.  Nothing makes it better or worse.  Patient states that when she got up she had an episode of sweating and nausea lasting for only a few minutes.  She did not lose consciousness.  She did not vomit.  She denies exertional dyspnea, chest pressure.  She denies  hemoptysis, unilateral leg swelling, use of exogenous estrogens, recent confinement or surgeries.  She does not smoke.  She does not have a history of diabetes, hypertension, hyperlipidemia or primary relatives with history of coronary artery disease or MI.  Past Medical History:  Diagnosis Date  . Gastritis   . Hiatal hernia   . Obesity (BMI 30-39.9)   . Reflux   . Thyroid disease     Patient Active Problem List   Diagnosis Date Noted  . Stricture and stenosis of esophagus   . Columnar epithelial-lined lower esophagus   . Stomach irritation   . Gastric polyp   . Esophageal dysphagia   . Encounter for screening colonoscopy   . Diverticulosis of large intestine without diverticulitis   . Conversion disorder 06/11/2018  . Esophagitis, [place] grade A 09/06/2017  . Hiatal hernia 09/06/2017  . Schatzki's ring of distal esophagus 09/06/2017  . Other constipation 07/05/2017  . Sciatica 03/27/2017  . Pain in the chest   . Abnormal cardiac function test 02/26/2016  . Obesity (BMI 30-39.9)   . Angina pectoris (HCC) 02/25/2016  . Dyspnea 02/25/2016  . Hypothyroidism 02/25/2016  . 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 Chest pain 09/07/2015  . GERD (gastroesophageal reflux disease) 01/21/2014  . Gestational diabetes 01/21/2014  . Tonsillar hypertrophy 01/21/2014  . 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 CARDIAC CATHETERIZATION  02/27/2016   Procedure: Left Heart Cath and Coronary Angiography;  Surgeon: Corky Crafts, MD;  Location: [facility]  INVASIVE CV LAB;  Service: Cardiovascular;;  . CESAREAN SECTION    . CHOLECYSTECTOMY    . COLONOSCOPY WITH PROPOFOL N/A 04/15/2019   Procedure: COLONOSCOPY WITH PROPOFOL;  Surgeon: Virgel Manifold, MD;  Location: [facility] ENDOSCOPY;  Service: Endoscopy;  Laterality: N/A;  . ESOPHAGOGASTRODUODENOSCOPY (EGD) WITH PROPOFOL N/A 04/15/2019   Procedure: ESOPHAGOGASTRODUODENOSCOPY (EGD)  WITH PROPOFOL;  Surgeon: Virgel Manifold, MD;  Location: [facility] ENDOSCOPY;  Service: Endoscopy;  Laterality: N/A;  . THYROIDECTOMY    . THYROIDECTOMY       OB History   No obstetric history on file.     Family History  Problem Relation Age of Onset  . Breast cancer Neg Hx     Social History   Tobacco Use  . Smoking status: Never Smoker  . Smokeless tobacco: Never Used  Substance Use Topics  . Alcohol use: No    Alcohol/week: 0.0 standard drinks  . Drug use: No    Home Medications Prior to Admission medications   Medication Sig Start Date End Date Taking? Authorizing Provider  cyclobenzaprine (FLEXERIL) 5 MG tablet Take 1 tablet (5 mg total) by mouth 3 (three) times daily as needed. 11/15/19   Menshew, Dannielle Karvonen, PA-C  ketorolac (TORADOL) 10 MG tablet Take 1 tablet (10 mg total) by mouth every 8 (eight) hours. 11/15/19   Menshew, Dannielle Karvonen, PA-C  levothyroxine (SYNTHROID, LEVOTHROID) 137 MCG tablet Take 137 mcg by mouth daily before breakfast.    [provider]  omeprazole (PRILOSEC) 20 MG capsule Take 20 mg by mouth 2 (two) times daily before a meal.    [provider]    Allergies    Patient has no known allergies.  Review of Systems   Review of Systems Ten systems reviewed and are negative for acute change, except as noted in the HPI.   Physical Exam Updated Vital Signs BP 128/70   Pulse 72   Temp 98 F (36.7 C) (Oral)   Resp 16   Ht 5\' 4"  (1.626 m)   Wt 99.3 kg   LMP 11/08/2019   SpO2 97%   BMI 37.59 kg/m   Physical Exam Vitals and nursing note reviewed.  Constitutional:      General: She is not in acute distress.    Appearance: She is well-developed. She is not diaphoretic.  HENT:     Head: Normocephalic and atraumatic.  Eyes:     General: No scleral icterus.    Conjunctiva/sclera: Conjunctivae normal.  Cardiovascular:     Rate and Rhythm: Normal rate and regular rhythm.     Heart sounds: Normal heart sounds. No  murmur. No friction rub. No gallop.   Pulmonary:     Effort: Pulmonary effort is normal. No respiratory distress.     Breath sounds: Normal breath sounds.  Chest:     Chest wall: Tenderness present.    Abdominal:     General: Bowel sounds are normal. There is no distension.     Palpations: Abdomen is soft. There is no mass.     Tenderness: There is no abdominal tenderness. There is no guarding.  Musculoskeletal:     Cervical back: Normal range of motion.  Skin:    General: Skin is warm and dry.  Neurological:     Mental Status: She is alert and oriented to person, place, and time.  Psychiatric:        Behavior: Behavior normal.     ED Results / Procedures / Treatments  Labs (all labs ordered are listed, but only abnormal results are displayed) Labs Reviewed  BASIC METABOLIC PANEL - Abnormal; Notable for the following components:      Result Value   Glucose, Bld 105 (*)    All other components within normal limits  CBC  I-STAT BETA HCG BLOOD, ED ([facility], [facility], [facility] ONLY)  TROPONIN I (HIGH SENSITIVITY)    EKG EKG Interpretation  Date/Time:  Sunday November 22 2019 09:03:13 EDT Ventricular Rate:  67 PR Interval:    QRS Duration: 90 QT Interval:  419 QTC Calculation: 443 R Axis:   67 Text Interpretation: Sinus rhythm Confirmed by Tilden Fossa [phone]) on 11/22/2019 9:06:52 AM   Radiology DG Chest 2 View  Result Date: 11/22/2019 CLINICAL DATA:  Patient with chest pain. EXAM: CHEST - 2 VIEW COMPARISON:  Chest radiograph 11/19/2019. FINDINGS: Monitoring leads overlie the patient. Normal cardiac and mediastinal contours. No consolidative pulmonary opacities. No pleural effusion or pneumothorax. Osseous structures unremarkable. IMPRESSION: No acute cardiopulmonary process. Electronically Signed   By: Annia Belt M.D.   On: 11/22/2019 09:44    Procedures Procedures (including critical care time)  Medications Ordered in ED Medications - No data to display  ED Course  I have  reviewed the triage vital signs and the nursing notes.  Pertinent labs & imaging results that were available during my care of the patient were reviewed by me and considered in my medical decision making (see chart for details).  Clinical Course as of Nov 21 1009  Sun Nov 22, 2019  1001 Left heart Cath by Dr. Eldridge Dace in 2017 shows: The left ventricular systolic function is normal. No angiographically apparent coronary artery disease. Normal LVEDP   [AH]    Clinical Course User Index [AH] Arthor Captain, PA-C   MDM Rules/Calculators/A&P HEAR Score: 3                    I have reviewed the patients labs which shows a mildly elevated blood glucose of insignificant value, negative pregnancy test, troponin negative x2.  I personally reviewed the patient's 2 view chest x-ray which shows no abnormalities on my interpretation.  Patient's EKG shows normal sinus rhythm at a rate of 67 without evidence of ischemia or other abnormalities.  Given the large differential diagnosis for Kylin Genna, the decision making in this case is of high complexity.  After evaluating all of the data points in this case, the presentation of Serrena Linderman is NOT consistent with Acute Coronary Syndrome (ACS) and/or myocardial ischemia, pulmonary embolism, aortic dissection; Borhaave's, significant arrythmia, pneumothorax, cardiac tamponade, or other emergent cardiopulmonary condition.  Further, the presentation of Neli Fofana is NOT consistent with pericarditis, myocarditis, cholecystitis, pancreatitis, mediastinitis, endocarditis, new valvular disease.  Additionally, the presentation of Maud Rubendall NOT consistent with flail chest, cardiac contusion, ARDS, or significant intra-thoracic or intra-abdominal bleeding.  Moreover, this presentation is NOT consistent with pneumonia, sepsis, or pyelonephritis   Strict return and follow-up precautions have been given by me personally or by detailed written instruction  given verbally by nursing staff using the teach back method to the patient/family/caregiver(s).  Data Reviewed/Counseling: I have reviewed the patient's vital signs, nursing notes, and other relevant tests/information. I had a detailed discussion regarding the historical points, exam findings, and any diagnostic results supporting the discharge diagnosis. I also discussed the need for outpatient follow-up and the need to return to the ED if symptoms worsen or if there are any questions or concerns that arise at  home.  Final Clinical Impression(s) / ED Diagnoses Final diagnoses:  Atypical chest pain    Rx / DC Orders ED Discharge Orders    None       Arthor Captain, PA-C 11/22/19 1310    Tilden Fossa, MD 11/22/19 [phone]

## 2019-11-22 NOTE — ED Notes (Signed)
Patient verbalizes understanding of discharge instructions. Opportunity for questioning and answers were provided. Pt discharged from ED. 

## 2019-12-03 ENCOUNTER — Other Ambulatory Visit: Payer: Self-pay

## 2019-12-03 ENCOUNTER — Ambulatory Visit (INDEPENDENT_AMBULATORY_CARE_PROVIDER_SITE_OTHER): Payer: Medicaid Other | Admitting: Cardiology

## 2019-12-03 ENCOUNTER — Encounter: Payer: Self-pay | Admitting: Cardiology

## 2019-12-03 ENCOUNTER — Other Ambulatory Visit: Payer: Self-pay | Admitting: *Deleted

## 2019-12-03 VITALS — BP 132/82 | HR 72 | Ht 64.0 in | Wt 221.0 lb

## 2019-12-03 DIAGNOSIS — R079 Chest pain, unspecified: Secondary | ICD-10-CM

## 2019-12-03 DIAGNOSIS — Z6837 Body mass index (BMI) 37.0-37.9, adult: Secondary | ICD-10-CM | POA: Diagnosis not present

## 2019-12-03 MED ORDER — METOPROLOL TARTRATE 100 MG PO TABS: 100.0000 mg | ORAL_TABLET | ORAL | 0 refills | Status: DC

## 2019-12-03 MED ORDER — METOPROLOL TARTRATE 100 MG PO TABS: 100.0000 mg | ORAL_TABLET | ORAL | 3 refills | Status: DC

## 2019-12-03 NOTE — Telephone Encounter (Signed)
I called them to clarify it should just be one pill Metoprolol tartrate 100 mg to be taken 2 hours prior to procedure.

## 2019-12-03 NOTE — Progress Notes (Signed)
Cardiology Office Note:    Date:  12/03/2019   ID:  Morgan Stevens, DOB Oct 21, 1965, MRN [id number]  PCP:  Christiansen, [place] Nicole, PA-C  Cardiologist:  Debbe Odea, MD  Electrophysiologist:  None   Referring MD: Irean Hong, MD   Chief Complaint  Patient presents with  . New Patient (Initial Visit)    Meds verbally reviewed w/ pt. Pt. was at [facility] ER; chest pain. Pt. c/o pain in left groin.     History of Present Illness:    Morgan Stevens is a 54 y.o. female with a hx of obesity, GERD, hiatal hernia who presents due to chest pain.  Patient states having chest discomfort which typically occurs when she is doing something such as moving around.  Symptoms have been ongoing for the past 2 weeks.  At first onset of symptoms 2 weeks ago, she describes chest tightness which she rates as 8 out of 10.  Symptoms typically last about 5 minutes.  This caused her to present to the ED at [facility].  Work-up with EKG and troponin was unrevealing.  Patient was then discharged, symptoms resolved a couple of minutes later.  5 days ago, patient had similar symptoms, presented to the ED at [facility] where work-up was also unrevealing.  She denies any history of heart disease, denies smoking, or family history of heart disease.  She states having some shortness of breath with symptoms.   Past Medical History:  Diagnosis Date  . Gastritis   . Hiatal hernia   . Obesity (BMI 30-39.9)   . Reflux   . Thyroid disease     Past Surgical History:  Procedure Laterality Date  . CARDIAC CATHETERIZATION  02/27/2016   Procedure: Left Heart Cath and Coronary Angiography;  Surgeon: Corky Crafts, MD;  Location: [facility] INVASIVE CV LAB;  Service: Cardiovascular;;  . CESAREAN SECTION    . CHOLECYSTECTOMY    . COLONOSCOPY WITH PROPOFOL N/A 04/15/2019   Procedure: COLONOSCOPY WITH PROPOFOL;  Surgeon: Pasty Spillers, MD;  Location: [facility] ENDOSCOPY;  Service: Endoscopy;  Laterality: N/A;  .  ESOPHAGOGASTRODUODENOSCOPY (EGD) WITH PROPOFOL N/A 04/15/2019   Procedure: ESOPHAGOGASTRODUODENOSCOPY (EGD) WITH PROPOFOL;  Surgeon: Pasty Spillers, MD;  Location: [facility] ENDOSCOPY;  Service: Endoscopy;  Laterality: N/A;  . THYROIDECTOMY    . THYROIDECTOMY      Current Medications: Current Meds  Medication Sig  . cyclobenzaprine (FLEXERIL) 5 MG tablet Take 1 tablet (5 mg total) by mouth 3 (three) times daily as needed. (Patient taking differently: Take 5 mg by mouth 3 (three) times daily as needed for muscle spasms. )  . famotidine (PEPCID) 40 MG tablet Take 40 mg by mouth daily.  . fluticasone (FLONASE) 50 MCG/ACT nasal spray Place 2 sprays into both nostrils daily.  Marland Kitchen ketorolac (TORADOL) 10 MG tablet Take 1 tablet (10 mg total) by mouth every 8 (eight) hours.  Marland Kitchen levothyroxine (SYNTHROID, LEVOTHROID) 137 MCG tablet Take 137 mcg by mouth daily before breakfast.  . omeprazole (PRILOSEC) 20 MG capsule Take 20 mg by mouth 2 (two) times daily before a meal.     Allergies:   Patient has no known allergies.   Social History   Socioeconomic History  . Marital status: Married    Spouse name: Not on file  . Number of children: Not on file  . Years of education: Not on file  . Highest education level: Not on file  Occupational History  . Occupation: home maker  Tobacco Use  .  Smoking status: Never Smoker  . Smokeless tobacco: Never Used  Substance and Sexual Activity  . Alcohol use: No    Alcohol/week: 0.0 standard drinks  . Drug use: No  . Sexual activity: Not on file  Other Topics Concern  . Not on file  Social History Narrative   Home maker, 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, husband is a Administrator, sports   Social Determinants of Corporate investment banker Strain:   . Difficulty of Paying Living Expenses:   Food Insecurity:   . Worried About Programme researcher, broadcasting/film/video in the Last Year:   . Barista in the Last Year:   Transportation Needs:   . Freight forwarder (Medical):   Marland Kitchen  Lack of Transportation (Non-Medical):   Physical Activity:   . Days of Exercise per Week:   . Minutes of Exercise per Session:   Stress:   . Feeling of Stress :   Social Connections:   . Frequency of Communication with Friends and Family:   . Frequency of Social Gatherings with Friends and Family:   . Attends Religious Services:   . Active Member of Clubs or Organizations:   . Attends Banker Meetings:   Marland Kitchen Marital Status:      Family History: The patient's family history is negative for Breast cancer.  ROS:   Please see the history of present illness.     All other systems reviewed and are negative.  EKGs/Labs/Other Studies Reviewed:    The following studies were reviewed today:   EKG:  EKG is  ordered today.  The ekg ordered today demonstrates normal sinus rhythm, normal ECG.  Recent Labs: 11/19/2019: ALT 51 11/22/2019: BUN 16; Creatinine, Ser 0.87; Hemoglobin 13.2; Platelets 310; Potassium 3.5; Sodium 140  Recent Lipid Panel    Component Value Date/Time   CHOL 153 02/26/2016 0035   CHOL 165 05/08/2013 0013   TRIG 101 02/26/2016 0035   TRIG 154 05/08/2013 0013   HDL 32 (L) 02/26/2016 0035   HDL 40 05/08/2013 0013   CHOLHDL 4.8 02/26/2016 0035   VLDL 20 02/26/2016 0035   VLDL 31 05/08/2013 0013   LDLCALC 101 (H) 02/26/2016 0035   LDLCALC 94 05/08/2013 0013    Physical Exam:    VS:  BP 132/82 (BP Location: Right Arm, Patient Position: Sitting, Cuff Size: Large)   Pulse 72   Ht 5\' 4"  (1.626 m)   Wt 221 lb (100.2 kg)   LMP 11/08/2019   SpO2 98%   BMI 37.93 kg/m     Wt Readings from Last 3 Encounters:  12/03/19 221 lb (100.2 kg)  11/22/19 219 lb (99.3 kg)  11/19/19 219 lb (99.3 kg)     GEN:  Well nourished, well developed in no acute distress HEENT: Normal NECK: No JVD; No carotid bruits LYMPHATICS: No lymphadenopathy CARDIAC: RRR, no murmurs, rubs, gallops RESPIRATORY:  Clear to auscultation without rales, wheezing or rhonchi  ABDOMEN:  Soft, non-tender, non-distended MUSCULOSKELETAL:  No edema; No deformity  SKIN: Warm and dry NEUROLOGIC:  Alert and oriented x 3 PSYCHIATRIC:  Normal affect   ASSESSMENT:    1. Chest pain of uncertain etiology   2. BMI 37.0-37.9, adult   3. Chest pain, unspecified type    PLAN:    In order of problems listed above:  1. Patient with a 2-week history of exertional chest discomfort.  Symptoms also associated with some shortness of breath, prompting 2 ED visits.  She has risk factors  of obesity.  Her chest discomfort has some exertional quality to it.  Will evaluate patient with an echocardiogram for any structural abnormalities.  Obtain coronary CTA to evaluate presence of CAD.  2. Patient is obese.  Low calorie diet recommended.  This note was generated in part or whole with voice recognition software. Voice recognition is usually quite accurate but there are transcription errors that can and very often do occur. I apologize for any typographical errors that were not detected and corrected.  Medication Adjustments/Labs and Tests Ordered: Current medicines are reviewed at length with the patient today.  Concerns regarding medicines are outlined above.  Orders Placed This Encounter  Procedures  . CT CORONARY MORPH W/CTA COR W/SCORE W/CA W/CM &/OR WO/CM  . CT CORONARY FRACTIONAL FLOW RESERVE DATA PREP  . CT CORONARY FRACTIONAL FLOW RESERVE FLUID ANALYSIS  . EKG 12-Lead  . ECHOCARDIOGRAM COMPLETE   Meds ordered this encounter  Medications  . DISCONTD: metoprolol tartrate (LOPRESSOR) 100 MG tablet    Sig: Take 1 tablet (100 mg total) by mouth as directed. Take this pill 2 hours prior to your test.    Dispense:  180 tablet    Refill:  3  . metoprolol tartrate (LOPRESSOR) 100 MG tablet    Sig: Take 1 tablet (100 mg total) by mouth as directed. Take this pill 2 hours prior to your test.    Dispense:  1 tablet    Refill:  0    Patient Instructions  Medication Instructions:  Your  physician has recommended you make the following change in your medication:  1. TAKE Metoprolol tartrate 100 mg 2 hours prior to procedure.    *If you need a refill on your cardiac medications before your next appointment, please call your pharmacy*   Lab Work: None  If you have labs (blood work) drawn today and your tests are completely normal, you will receive your results only by: Marland Kitchen MyChart Message (if you have MyChart) OR . A paper copy in the mail If you have any lab test that is abnormal or we need to change your treatment, we will call you to review the results.   Testing/Procedures: Your physician has requested that you have an echocardiogram. Echocardiography is a painless test that uses sound waves to create images of your heart. It provides your doctor with information about the size and shape of your heart and how well your heart's chambers and valves are working. This procedure takes approximately one hour. There are no restrictions for this procedure.  Your cardiac CT will be scheduled at one of the below locations:   [facility] [address] [place], [place] [address] [phone]   If scheduled at [facility], please arrive at the [facility] main entrance of [facility] 30 minutes prior to test start time. Proceed to the [facility] Radiology Department (first floor) to check-in and test prep.   Please follow these instructions carefully (unless otherwise directed):  On the Night Before the Test: . Be sure to Drink plenty of water. . Do not consume any caffeinated/decaffeinated beverages or chocolate 12 hours prior to your test. . Do not take any antihistamines 12 hours prior to your test. . If you take Metformin do not take 24 hours prior to test.  On the Day of the Test: . Drink plenty of water. Do not drink any water within one hour of the test. . Do not eat any food 4 hours prior to  the test. . You may take your regular  medications prior to the test.  . Take metoprolol (Lopressor) two hours prior to test. . HOLD Furosemide/Hydrochlorothiazide morning of the test. . FEMALES- please wear underwire-free bra if available   *For Clinical Staff only. Please instruct patient the following:*        -Drink plenty of water       -Hold Furosemide/hydrochlorothiazide morning of the test       -Take metoprolol (Lopressor) 2 hours prior to test (if applicable).                       After the Test: . Drink plenty of water. . After receiving IV contrast, you may experience a mild flushed feeling. This is normal. . On occasion, you may experience a mild rash up to 24 hours after the test. This is not dangerous. If this occurs, you can take Benadryl 25 mg and increase your fluid intake. . If you experience trouble breathing, this can be serious. If it is severe call 911 IMMEDIATELY. If it is mild, please call our office. . If you take any of these medications: Glipizide/Metformin, Avandament, Glucavance, please do not take 48 hours after completing test unless otherwise instructed.   Once we have confirmed authorization from your insurance company, we will call you to set up a date and time for your test.   For non-scheduling related questions, please contact the cardiac imaging nurse navigator should you have any questions/concerns: Marchia Bond, RN Navigator Cardiac Imaging [facility] [phone] office  For scheduling needs, including cancellations and rescheduling, please call [phone].      Follow-Up: At [facility], you and your health needs are our priority.  As part of our continuing mission to provide you with exceptional heart care, we have created designated Provider Care Teams.  These Care Teams include your primary Cardiologist (physician) and Advanced Practice Providers (APPs -  Physician Assistants and Nurse Practitioners) who all work together to provide you with  the care you need, when you need it.   Your next appointment:   Follow up after testing   The format for your next appointment:   In Person  Provider:    You may see Kate Sable, MD or one of the following Advanced Practice Providers on your designated Care Team:    Murray Hodgkins, NP  Christell Faith, PA-C  Marrianne Mood, PA-C       Signed, Kate Sable, MD  12/03/2019 1:08 PM    [place]

## 2019-12-03 NOTE — Patient Instructions (Signed)
Medication Instructions:  Your physician has recommended you make the following change in your medication:  1. TAKE Metoprolol tartrate 100 mg 2 hours prior to procedure.    *If you need a refill on your cardiac medications before your next appointment, please call your pharmacy*   Lab Work: None  If you have labs (blood work) drawn today and your tests are completely normal, you will receive your results only by: Marland Kitchen MyChart Message (if you have MyChart) OR . A paper copy in the mail If you have any lab test that is abnormal or we need to change your treatment, we will call you to review the results.   Testing/Procedures: Your physician has requested that you have an echocardiogram. Echocardiography is a painless test that uses sound waves to create images of your heart. It provides your doctor with information about the size and shape of your heart and how well your heart's chambers and valves are working. This procedure takes approximately one hour. There are no restrictions for this procedure.  Your cardiac CT will be scheduled at one of the below locations:   [facility] [address] [place], [place] [address] [phone]   If scheduled at [facility], please arrive at the [facility] main entrance of [place] 30 minutes prior to test start time. Proceed to the [facility] Radiology Department (first floor) to check-in and test prep.   Please follow these instructions carefully (unless otherwise directed):  On the Night Before the Test: . Be sure to Drink plenty of water. . Do not consume any caffeinated/decaffeinated beverages or chocolate 12 hours prior to your test. . Do not take any antihistamines 12 hours prior to your test. . If you take Metformin do not take 24 hours prior to test.  On the Day of the Test: . Drink plenty of water. Do not drink any water within one hour of the test. . Do not eat any food 4 hours prior to the  test. . You may take your regular medications prior to the test.  . Take metoprolol (Lopressor) two hours prior to test. . HOLD Furosemide/Hydrochlorothiazide morning of the test. . FEMALES- please wear underwire-free bra if available   *For Clinical Staff only. Please instruct patient the following:*        -Drink plenty of water       -Hold Furosemide/hydrochlorothiazide morning of the test       -Take metoprolol (Lopressor) 2 hours prior to test (if applicable).                       After the Test: . Drink plenty of water. . After receiving IV contrast, you may experience a mild flushed feeling. This is normal. . On occasion, you may experience a mild rash up to 24 hours after the test. This is not dangerous. If this occurs, you can take Benadryl 25 mg and increase your fluid intake. . If you experience trouble breathing, this can be serious. If it is severe call 911 IMMEDIATELY. If it is mild, please call our office. . If you take any of these medications: Glipizide/Metformin, Avandament, Glucavance, please do not take 48 hours after completing test unless otherwise instructed.   Once we have confirmed authorization from your insurance company, we will call you to set up a date and time for your test.   For non-scheduling related questions, please contact the cardiac imaging nurse navigator should you have  any questions/concerns: Rockwell Alexandria, RN Navigator Cardiac Imaging [facility] [phone] office  For scheduling needs, including cancellations and rescheduling, please call [phone].      Follow-Up: At [facility], you and your health needs are our priority.  As part of our continuing mission to provide you with exceptional heart care, we have created designated Provider Care Teams.  These Care Teams include your primary Cardiologist (physician) and Advanced Practice Providers (APPs -  Physician Assistants and Nurse Practitioners) who  all work together to provide you with the care you need, when you need it.   Your next appointment:   Follow up after testing   The format for your next appointment:   In Person  Provider:    You may see Debbe Odea, MD or one of the following Advanced Practice Providers on your designated Care Team:    Nicolasa Ducking, NP  Eula Listen, PA-C  Marisue Ivan, PA-C

## 2019-12-09 NOTE — Telephone Encounter (Signed)
Thank you :)

## 2019-12-15 DIAGNOSIS — G8929 Other chronic pain: Secondary | ICD-10-CM | POA: Diagnosis not present

## 2019-12-15 DIAGNOSIS — M25512 Pain in left shoulder: Secondary | ICD-10-CM | POA: Diagnosis not present

## 2019-12-15 DIAGNOSIS — M7542 Impingement syndrome of left shoulder: Secondary | ICD-10-CM | POA: Diagnosis not present

## 2019-12-23 ENCOUNTER — Telehealth (HOSPITAL_COMMUNITY): Payer: Self-pay | Admitting: Emergency Medicine

## 2019-12-23 NOTE — Telephone Encounter (Signed)
Attempted to call patient regarding upcoming cardiac CT appointment. °Left message on voicemail with name and callback number °Sophy Mesler RN Navigator Cardiac Imaging °[facility] °[phone] Office °[phone] Cell ° °

## 2019-12-24 ENCOUNTER — Other Ambulatory Visit: Payer: Self-pay

## 2019-12-24 ENCOUNTER — Ambulatory Visit
Admission: RE | Admit: 2019-12-24 | Discharge: 2019-12-24 | Disposition: A | Discharge status: Home / Self Care | Payer: Medicaid Other | Source: Ambulatory Visit | Attending: Cardiology | Admitting: Cardiology

## 2019-12-24 DIAGNOSIS — R079 Chest pain, unspecified: Secondary | ICD-10-CM | POA: Insufficient documentation

## 2019-12-24 IMAGING — CT CT HEART MORP W/ CTA COR W/ SCORE W/ CA W/CM &/OR W/O CM
1 of 14 series · 3 of 20 positions shown, 4 images · non-contrast
Comparison: [DATE]

Addendum:
CLINICAL DATA: Hx of angina and dyspnea

EXAM:
Cardiac/Coronary  CTA
TECHNIQUE: The patient was scanned on a Siemens Somatoform go.Top scanner.

[Series 46: (id) % cta coronary 0.60 · axial · 0.37mm/px · z∈[-1099,-1042]mm · 3 of 5453 slices shown, 4 images]
[im 1364/5453  vessel]
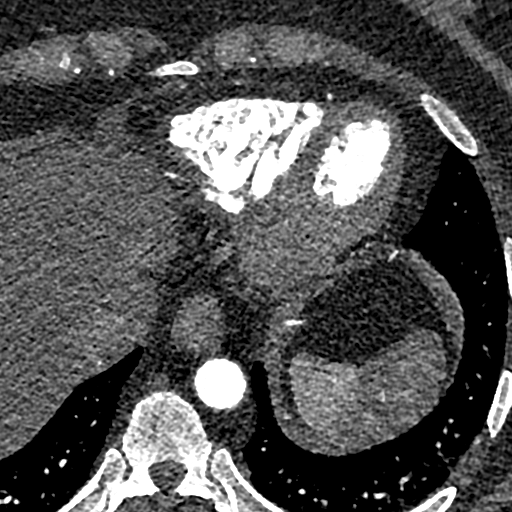
[im 1364/5453  lung]
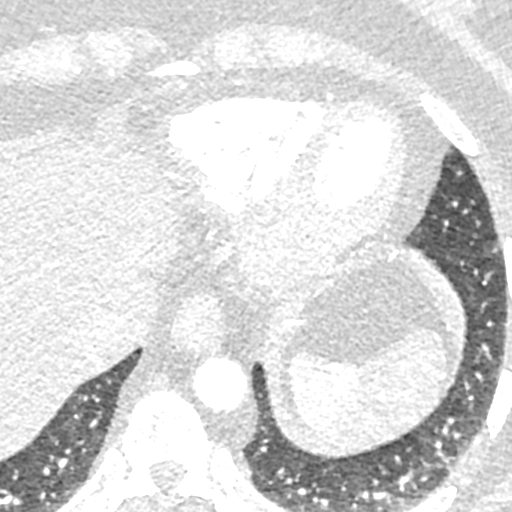
[im 2727/5453  vessel]
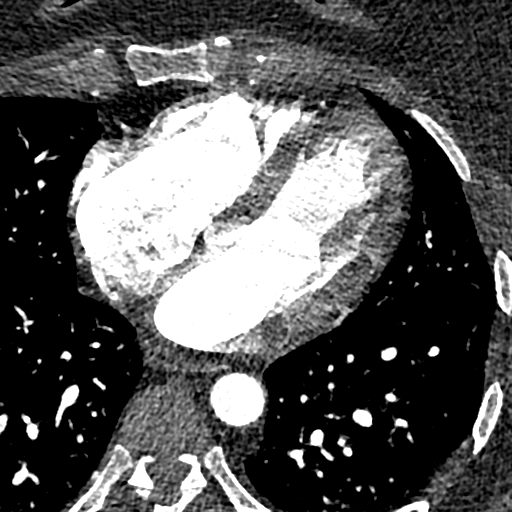
[im 4090/5453  vessel]
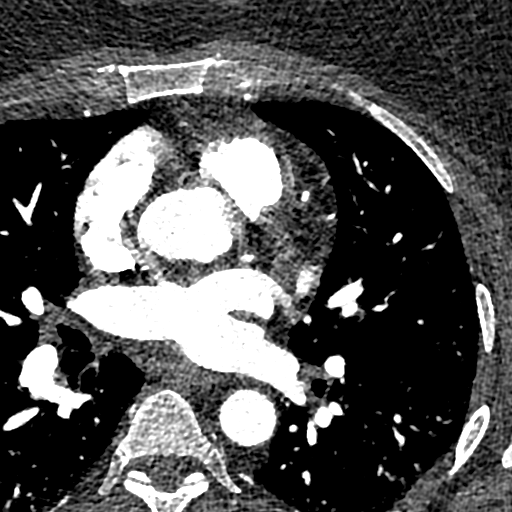

[3 of 20 positions shown; findings below may reference images not displayed]

FINDINGS: A retrospective scan was triggered in the descending thoracic aorta.
Axial non-contrast 3 mm slices were carried out through the heart.
The data set was analyzed on a dedicated work station and scored
using the Agatson method. Gantry rotation speed was 330 msecs and
collimation was .6 mm. 100mg of metoprolol and 0.8 mg of sl NTG was
given. The 3D data set was reconstructed in 5% intervals of the
45-95 % of the R-R cycle. Diastolic phases were analyzed on a
dedicated work station using MPR, MIP and VRT modes. The patient
received 125 cc of contrast.

Aorta:  Normal size.  No calcifications.  No dissection.

Aortic Valve:  Trileaflet.  No calcifications.

Coronary Arteries:  Normal coronary origin.  Right dominance.

RCA is a large dominant artery that gives rise to PDA and PLA. There
is no plaque.

Left main is a large artery that gives rise to LAD and LCX arteries.

LAD is a large vessel that has no plaque.

LCX is a non-dominant artery that gives rise to one large OM1
branch. There is no plaque.

Other findings:

Normal pulmonary vein drainage into the left atrium.

Normal left atrial appendage without a thrombus.

Normal size of the pulmonary artery.
IMPRESSION: 1. Coronary calcium score of 0. Patient is low risk for near term
coronary events

2. Normal coronary origin with right dominance.

3. No evidence of CAD.

4. CAD-RADS 0. Consider non-atherosclerotic causes of chest pain.

EXAM:
OVER-READ INTERPRETATION  CT CHEST

The following report is an over-read performed by radiologist Dr.
PARASI [REDACTED] on [DATE]. This over-read
does not include interpretation of cardiac or coronary anatomy or
pathology. The coronary CTA interpretation by the cardiologist is
attached.
FINDINGS: Heart is normal size. Aorta is normal caliber. No adenopathy in the
lower mediastinum or hila. Subpleural nodule in the left lower lobe
measures 3 mm. 2-3 mm nodule in the left lower lobe on image 30.
These are stable when compared to prior study. No confluent
opacities or effusions. Diffuse fatty infiltration of the liver
suspected. Chest wall soft tissues are unremarkable. No acute bony
abnormality.
IMPRESSION: 2-3 mm nodules in the left lower lobe. These have been stable dating
back to [DATE]. No follow-up needed if patient is low-risk (and
has no known or suspected primary neoplasm). Non-contrast chest CT
can be considered in 12 months if patient is high-risk. This
recommendation follows the consensus statement: Guidelines for
Management of Incidental Pulmonary Nodules Detected on CT Images:

*** End of Addendum ***
FINDINGS: A retrospective scan was triggered in the descending thoracic aorta.
Axial non-contrast 3 mm slices were carried out through the heart.
The data set was analyzed on a dedicated work station and scored
using the Agatson method. Gantry rotation speed was 330 msecs and
collimation was .6 mm. 100mg of metoprolol and 0.8 mg of sl NTG was
given. The 3D data set was reconstructed in 5% intervals of the
45-95 % of the R-R cycle. Diastolic phases were analyzed on a
dedicated work station using MPR, MIP and VRT modes. The patient
received 125 cc of contrast.

Aorta:  Normal size.  No calcifications.  No dissection.

Aortic Valve:  Trileaflet.  No calcifications.

Coronary Arteries:  Normal coronary origin.  Right dominance.

RCA is a large dominant artery that gives rise to PDA and PLA. There
is no plaque.

Left main is a large artery that gives rise to LAD and LCX arteries.

LAD is a large vessel that has no plaque.

LCX is a non-dominant artery that gives rise to one large OM1
branch. There is no plaque.

Other findings:

Normal pulmonary vein drainage into the left atrium.

Normal left atrial appendage without a thrombus.

Normal size of the pulmonary artery.
IMPRESSION: 1. Coronary calcium score of 0. Patient is low risk for near term
coronary events

2. Normal coronary origin with right dominance.

3. No evidence of CAD.

4. CAD-RADS 0. Consider non-atherosclerotic causes of chest pain.

## 2019-12-24 MED ORDER — IOHEXOL 350 MG/ML SOLN
125.0000 mL | Freq: Once | INTRAVENOUS | Status: AC | PRN
  Administered 2019-12-24: 125 mL via INTRAVENOUS

## 2019-12-24 MED ORDER — ONDANSETRON HCL 4 MG/2ML IJ SOLN
4.0000 mg | Freq: Once | INTRAMUSCULAR | Status: AC
  Administered 2019-12-24: 11:00:00 4 mg via INTRAVENOUS

## 2019-12-24 MED ORDER — NITROGLYCERIN 0.4 MG SL SUBL
0.8000 mg | SUBLINGUAL_TABLET | Freq: Once | SUBLINGUAL | Status: AC
  Administered 2019-12-24: 0.8 mg via SUBLINGUAL

## 2019-12-24 NOTE — Progress Notes (Signed)
Patient tolerated CT well however after patient had nausea and emesis. Administered Zofran 4mg  IVP. Patient rested in recliner nausea improved and drank water ate crackers. Ambulated to exit steady gait.

## 2020-01-05 ENCOUNTER — Ambulatory Visit (INDEPENDENT_AMBULATORY_CARE_PROVIDER_SITE_OTHER): Payer: Medicaid Other

## 2020-01-05 ENCOUNTER — Other Ambulatory Visit: Payer: Self-pay

## 2020-01-05 DIAGNOSIS — R079 Chest pain, unspecified: Secondary | ICD-10-CM

## 2020-01-05 DIAGNOSIS — Z6837 Body mass index (BMI) 37.0-37.9, adult: Secondary | ICD-10-CM

## 2020-01-08 ENCOUNTER — Other Ambulatory Visit: Payer: Self-pay

## 2020-01-08 ENCOUNTER — Ambulatory Visit (INDEPENDENT_AMBULATORY_CARE_PROVIDER_SITE_OTHER): Payer: Medicaid Other | Admitting: Cardiology

## 2020-01-08 ENCOUNTER — Encounter: Payer: Self-pay | Admitting: Cardiology

## 2020-01-08 VITALS — BP 120/80 | HR 73 | Ht 64.0 in | Wt 213.0 lb

## 2020-01-08 DIAGNOSIS — Z6836 Body mass index (BMI) 36.0-36.9, adult: Secondary | ICD-10-CM | POA: Diagnosis not present

## 2020-01-08 DIAGNOSIS — R079 Chest pain, unspecified: Secondary | ICD-10-CM

## 2020-01-08 NOTE — Patient Instructions (Signed)
Medication Instructions:  - Your physician recommends that you continue on your current medications as directed. Please refer to the Current Medication list given to you today.  *If you need a refill on your cardiac medications before your next appointment, please call your pharmacy*   Lab Work: - none ordered  If you have labs (blood work) drawn today and your tests are completely normal, you will receive your results only by: . MyChart Message (if you have MyChart) OR . A paper copy in the mail If you have any lab test that is abnormal or we need to change your treatment, we will call you to review the results.   Testing/Procedures: - none ordered   Follow-Up: At [facility], you and your health needs are our priority.  As part of our continuing mission to provide you with exceptional heart care, we have created designated Provider Care Teams.  These Care Teams include your primary Cardiologist (physician) and Advanced Practice Providers (APPs -  Physician Assistants and Nurse Practitioners) who all work together to provide you with the care you need, when you need it.  We recommend signing up for the patient portal called "MyChart".  Sign up information is provided on this After Visit Summary.  MyChart is used to connect with patients for Virtual Visits (Telemedicine).  Patients are able to view lab/test results, encounter notes, upcoming appointments, etc.  Non-urgent messages can be sent to your provider as well.   To learn more about what you can do with MyChart, go to [email or web address].    Your next appointment:   As needed   The format for your next appointment:   n/a  Provider:   Brian Agbor-Etang, MD   Other Instructions n/a  

## 2020-01-08 NOTE — Progress Notes (Signed)
Cardiology Office Note:    Date:  01/08/2020   ID:  Morgan Stevens, DOB 1965/10/21, MRN [id number]  PCP:  Christiansen, [place] Nicole, PA-C  Cardiologist:  Debbe Odea, MD  Electrophysiologist:  None   Referring MD: Christiansen, [place] Ni*   No chief complaint on file. Chief complaint :patient is here for follow-up after cardiac testing.  History of Present Illness:    Morgan Stevens is a 54 y.o. female with a hx of obesity, GERD, hiatal hernia who presents for follow-up.  She was last seen due to chest pain which was exertional in nature.  Work-up in the emergency room was unrevealing.  Echocardiogram coronary CTA was ordered to evaluate for presence of CAD and cardiac function.  Patient states her chest discomfort has improved things the last clinical visit.  She takes omeprazole and Pepcid for her reflux symptoms which seem to help.  She has no concerns at this time.   Past Medical History:  Diagnosis Date  . Gastritis   . Hiatal hernia   . Obesity (BMI 30-39.9)   . Reflux   . Thyroid disease     Past Surgical History:  Procedure Laterality Date  . CARDIAC CATHETERIZATION  02/27/2016   Procedure: Left Heart Cath and Coronary Angiography;  Surgeon: Corky Crafts, MD;  Location: [facility] INVASIVE CV LAB;  Service: Cardiovascular;;  . CESAREAN SECTION    . CHOLECYSTECTOMY    . COLONOSCOPY WITH PROPOFOL N/A 04/15/2019   Procedure: COLONOSCOPY WITH PROPOFOL;  Surgeon: Pasty Spillers, MD;  Location: [facility] ENDOSCOPY;  Service: Endoscopy;  Laterality: N/A;  . ESOPHAGOGASTRODUODENOSCOPY (EGD) WITH PROPOFOL N/A 04/15/2019   Procedure: ESOPHAGOGASTRODUODENOSCOPY (EGD) WITH PROPOFOL;  Surgeon: Pasty Spillers, MD;  Location: [facility] ENDOSCOPY;  Service: Endoscopy;  Laterality: N/A;  . THYROIDECTOMY    . THYROIDECTOMY      Current Medications: Current Meds  Medication Sig  . cyclobenzaprine (FLEXERIL) 5 MG tablet Take 1 tablet (5 mg total) by mouth 3 (three) times daily as  needed. (Patient taking differently: Take 5 mg by mouth 3 (three) times daily as needed for muscle spasms. )  . famotidine (PEPCID) 40 MG tablet Take 40 mg by mouth daily.  . fluticasone (FLONASE) 50 MCG/ACT nasal spray Place 2 sprays into both nostrils daily.  Marland Kitchen levothyroxine (SYNTHROID, LEVOTHROID) 137 MCG tablet Take 137 mcg by mouth daily before breakfast.  . omeprazole (PRILOSEC) 20 MG capsule Take 20 mg by mouth 2 (two) times daily before a meal.     Allergies:   Patient has no known allergies.   Social History   Socioeconomic History  . Marital status: Married    Spouse name: Not on file  . Number of children: Not on file  . Years of education: Not on file  . Highest education level: Not on file  Occupational History  . Occupation: home maker  Tobacco Use  . Smoking status: Never Smoker  . Smokeless tobacco: Never Used  Substance and Sexual Activity  . Alcohol use: No    Alcohol/week: 0.0 standard drinks  . Drug use: No  . Sexual activity: Not on file  Other Topics Concern  . Not on file  Social History Narrative   Home maker, 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, husband is a Administrator, sports   Social Determinants of Corporate investment banker Strain:   . Difficulty of Paying Living Expenses:   Food Insecurity:   . Worried About Programme researcher, broadcasting/film/video in the Last Year:   . Ran  Out of Food in the Last Year:   Transportation Needs:   . Lack of Transportation (Medical):   Marland Kitchen Lack of Transportation (Non-Medical):   Physical Activity:   . Days of Exercise per Week:   . Minutes of Exercise per Session:   Stress:   . Feeling of Stress :   Social Connections:   . Frequency of Communication with Friends and Family:   . Frequency of Social Gatherings with Friends and Family:   . Attends Religious Services:   . Active Member of Clubs or Organizations:   . Attends Banker Meetings:   Marland Kitchen Marital Status:      Family History: The patient's family history is negative for  Breast cancer.  ROS:   Please see the history of present illness.     All other systems reviewed and are negative.  EKGs/Labs/Other Studies Reviewed:    The following studies were reviewed today:   EKG:  EKG not ordered today  Recent Labs: 11/19/2019: ALT 51 11/22/2019: BUN 16; Creatinine, Ser 0.87; Hemoglobin 13.2; Platelets 310; Potassium 3.5; Sodium 140  Recent Lipid Panel    Component Value Date/Time   CHOL 153 02/26/2016 0035   CHOL 165 05/08/2013 0013   TRIG 101 02/26/2016 0035   TRIG 154 05/08/2013 0013   HDL 32 (L) 02/26/2016 0035   HDL 40 05/08/2013 0013   CHOLHDL 4.8 02/26/2016 0035   VLDL 20 02/26/2016 0035   VLDL 31 05/08/2013 0013   LDLCALC 101 (H) 02/26/2016 0035   LDLCALC 94 05/08/2013 0013    Physical Exam:    VS:  BP 120/80 (BP Location: Left Arm, Patient Position: Sitting, Cuff Size: Normal)   Pulse 73   Ht 5\' 4"  (1.626 m)   Wt 213 lb (96.6 kg)   SpO2 98%   BMI 36.56 kg/m     Wt Readings from Last 3 Encounters:  01/08/20 213 lb (96.6 kg)  12/03/19 221 lb (100.2 kg)  11/22/19 219 lb (99.3 kg)     GEN:  Well nourished, well developed in no acute distress HEENT: Normal NECK: No JVD; No carotid bruits LYMPHATICS: No lymphadenopathy CARDIAC: RRR, no murmurs, rubs, gallops RESPIRATORY:  Clear to auscultation without rales, wheezing or rhonchi  ABDOMEN: Soft, non-tender, non-distended MUSCULOSKELETAL:  No edema; No deformity  SKIN: Warm and dry NEUROLOGIC:  Alert and oriented x 3 PSYCHIATRIC:  Normal affect   ASSESSMENT:    1. Chest pain, unspecified type   2. BMI 36.0-36.9,adult    PLAN:    In order of problems listed above:  1. Patient with history of exertional chest discomfort and some shortness of breath.  Echocardiogram showed normal systolic function, EF 55 to 60%, impaired relaxation.  Mild mitral regurgitation.  Coronary CTA had a calcium score of 0, no evidence of CAD.  Patient is therefore low risk for near-term coronary events.   Results made known to patient, patient reassured. 2. Obesity, low calorie diet advised.  Follow-up as needed.  This note was generated in part or whole with voice recognition software. Voice recognition is usually quite accurate but there are transcription errors that can and very often do occur. I apologize for any typographical errors that were not detected and corrected.  Medication Adjustments/Labs and Tests Ordered: Current medicines are reviewed at length with the patient today.  Concerns regarding medicines are outlined above.  No orders of the defined types were placed in this encounter.  No orders of the defined types were placed in  this encounter.   Patient Instructions  Medication Instructions:  - Your physician recommends that you continue on your current medications as directed. Please refer to the Current Medication list given to you today.  *If you need a refill on your cardiac medications before your next appointment, please call your pharmacy*   Lab Work: - none ordered  If you have labs (blood work) drawn today and your tests are completely normal, you will receive your results only by: Marland Kitchen MyChart Message (if you have MyChart) OR . A paper copy in the mail If you have any lab test that is abnormal or we need to change your treatment, we will call you to review the results.   Testing/Procedures: - none ordered   Follow-Up: At [facility], you and your health needs are our priority.  As part of our continuing mission to provide you with exceptional heart care, we have created designated Provider Care Teams.  These Care Teams include your primary Cardiologist (physician) and Advanced Practice Providers (APPs -  Physician Assistants and Nurse Practitioners) who all work together to provide you with the care you need, when you need it.  We recommend signing up for the patient portal called "MyChart".  Sign up information is provided on this After Visit Summary.   MyChart is used to connect with patients for Virtual Visits (Telemedicine).  Patients are able to view lab/test results, encounter notes, upcoming appointments, etc.  Non-urgent messages can be sent to your provider as well.   To learn more about what you can do with MyChart, go to [email or web address].    Your next appointment:   As needed   The format for your next appointment:   n/a  Provider:   Kate Sable, MD   Other Instructions n/a     Signed, Kate Sable, MD  01/08/2020 2:03 PM    [place]

## 2020-01-15 DIAGNOSIS — M7542 Impingement syndrome of left shoulder: Secondary | ICD-10-CM | POA: Diagnosis not present

## 2020-01-15 DIAGNOSIS — M19012 Primary osteoarthritis, left shoulder: Secondary | ICD-10-CM | POA: Diagnosis not present

## 2020-01-15 DIAGNOSIS — M7522 Bicipital tendinitis, left shoulder: Secondary | ICD-10-CM | POA: Diagnosis not present

## 2020-01-15 DIAGNOSIS — M7582 Other shoulder lesions, left shoulder: Secondary | ICD-10-CM | POA: Diagnosis not present

## 2020-04-01 DIAGNOSIS — Z6836 Body mass index (BMI) 36.0-36.9, adult: Secondary | ICD-10-CM | POA: Diagnosis not present

## 2020-04-01 DIAGNOSIS — M545 Low back pain: Secondary | ICD-10-CM | POA: Diagnosis not present

## 2020-04-01 DIAGNOSIS — M542 Cervicalgia: Secondary | ICD-10-CM | POA: Diagnosis not present

## 2020-04-01 DIAGNOSIS — M25512 Pain in left shoulder: Secondary | ICD-10-CM | POA: Diagnosis not present

## 2020-04-28 DIAGNOSIS — E039 Hypothyroidism, unspecified: Secondary | ICD-10-CM | POA: Diagnosis not present

## 2020-06-15 DIAGNOSIS — E039 Hypothyroidism, unspecified: Secondary | ICD-10-CM | POA: Diagnosis not present

## 2020-07-25 DIAGNOSIS — M791 Myalgia, unspecified site: Secondary | ICD-10-CM | POA: Diagnosis not present

## 2020-07-25 DIAGNOSIS — Z20822 Contact with and (suspected) exposure to covid-19: Secondary | ICD-10-CM | POA: Diagnosis not present

## 2020-08-04 ENCOUNTER — Emergency Department: Payer: Medicaid Other

## 2020-08-04 ENCOUNTER — Emergency Department
Admission: EM | Admit: 2020-08-04 | Discharge: 2020-08-04 | Disposition: A | Discharge status: Home / Self Care | Payer: Medicaid Other | Attending: Emergency Medicine | Admitting: Emergency Medicine

## 2020-08-04 ENCOUNTER — Other Ambulatory Visit: Payer: Self-pay

## 2020-08-04 DIAGNOSIS — R059 Cough, unspecified: Secondary | ICD-10-CM | POA: Diagnosis not present

## 2020-08-04 DIAGNOSIS — R079 Chest pain, unspecified: Secondary | ICD-10-CM | POA: Diagnosis not present

## 2020-08-04 DIAGNOSIS — U071 COVID-19: Secondary | ICD-10-CM | POA: Diagnosis not present

## 2020-08-04 DIAGNOSIS — J069 Acute upper respiratory infection, unspecified: Secondary | ICD-10-CM | POA: Diagnosis not present

## 2020-08-04 DIAGNOSIS — R0789 Other chest pain: Secondary | ICD-10-CM | POA: Diagnosis not present

## 2020-08-04 DIAGNOSIS — B9789 Other viral agents as the cause of diseases classified elsewhere: Secondary | ICD-10-CM | POA: Diagnosis not present

## 2020-08-04 DIAGNOSIS — E039 Hypothyroidism, unspecified: Secondary | ICD-10-CM | POA: Insufficient documentation

## 2020-08-04 DIAGNOSIS — Z79899 Other long term (current) drug therapy: Secondary | ICD-10-CM | POA: Diagnosis not present

## 2020-08-04 LAB — RESP PANEL BY RT-PCR (FLU A&B, COVID) ARPGX2
Influenza A by PCR: NEGATIVE
Influenza B by PCR: NEGATIVE
SARS Coronavirus 2 by RT PCR: POSITIVE — AB

## 2020-08-04 IMAGING — CR DG CHEST 2V
2 series · 2 of 2 positions shown · non-contrast
Comparison: [DATE]

CLINICAL DATA: Cough. COVID positive. Chest wall pain when coughing

EXAM:
CHEST - 2 VIEW

[chest pa]
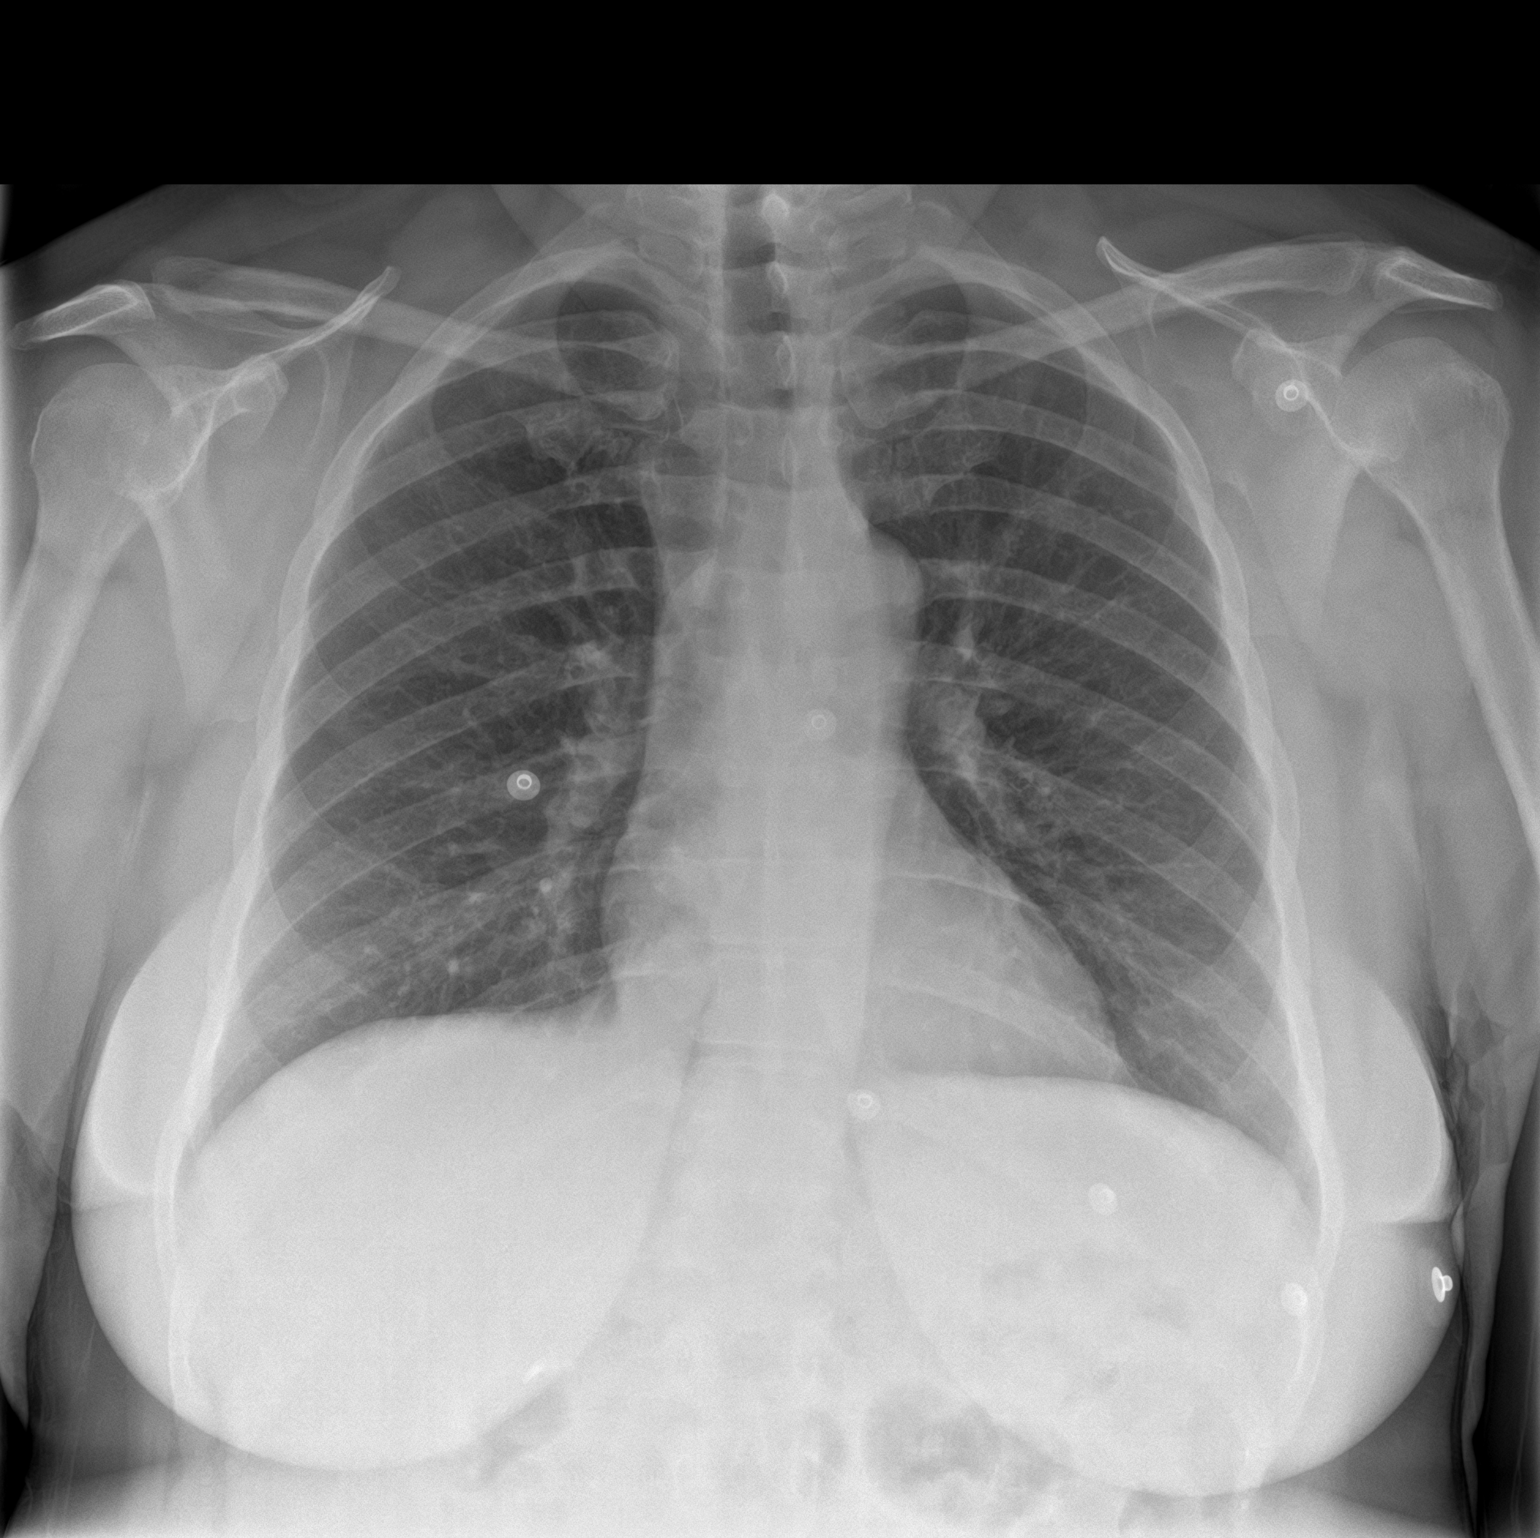

[chest lat]
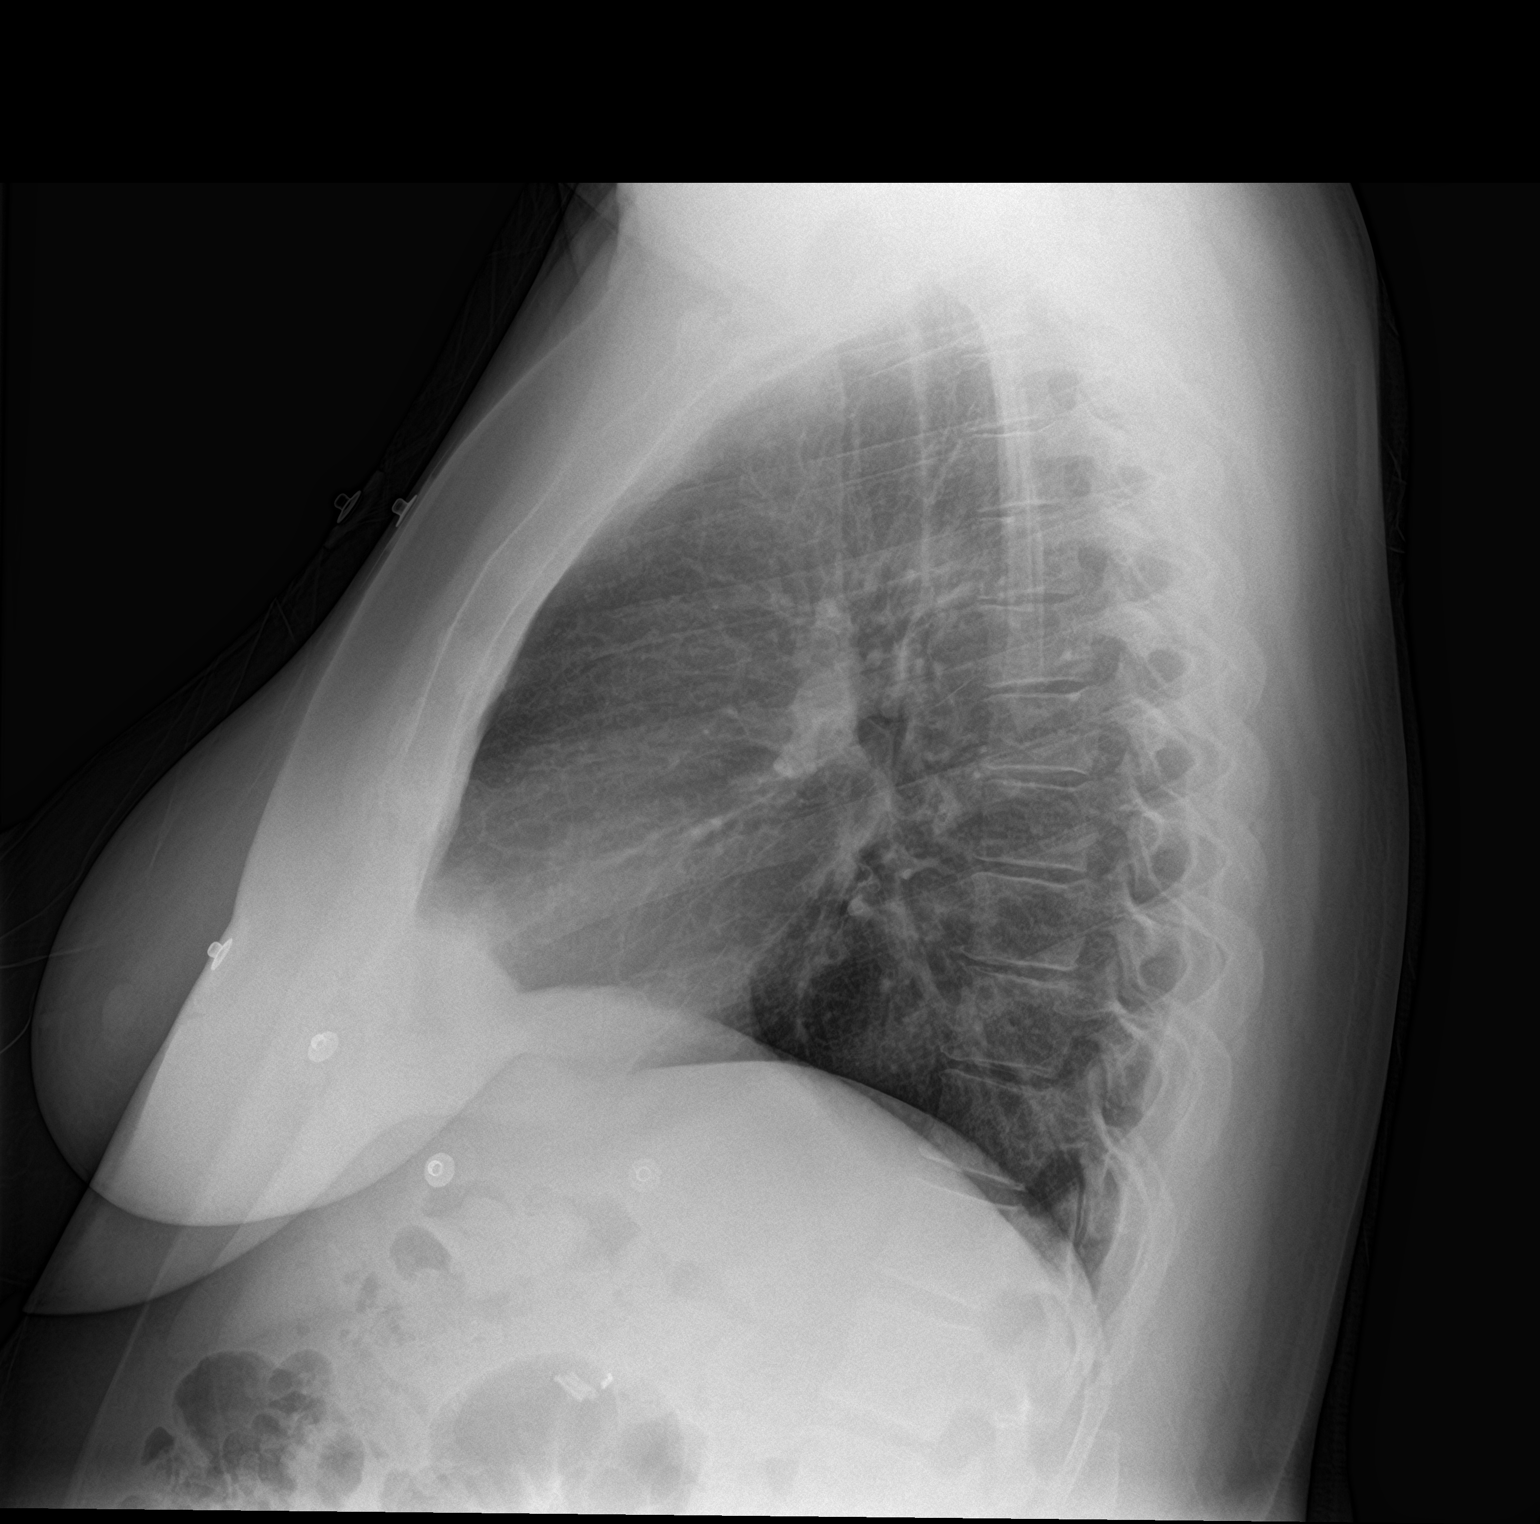

[2 of 2 positions shown; findings below may reference images not displayed]

FINDINGS: Normal heart size and mediastinal contours. No acute infiltrate or
edema. No effusion or pneumothorax. No acute osseous findings.
IMPRESSION: Negative chest.

## 2020-08-04 MED ORDER — HYDROCOD POLST-CPM POLST ER 10-8 MG/5ML PO SUER: 5.0000 mL | Freq: Two times a day (BID) | ORAL | 0 refills | Status: DC

## 2020-08-04 MED ORDER — METHYLPREDNISOLONE 4 MG PO TBPK: ORAL_TABLET | ORAL | 0 refills | Status: DC

## 2020-08-04 MED ORDER — DEXAMETHASONE SODIUM PHOSPHATE 10 MG/ML IJ SOLN
10.0000 mg | Freq: Once | INTRAMUSCULAR | Status: AC
  Administered 2020-08-04: 10 mg via INTRAMUSCULAR
  Filled 2020-08-04: qty 1

## 2020-08-04 MED ORDER — HYDROCOD POLST-CPM POLST ER 10-8 MG/5ML PO SUER
5.0000 mL | Freq: Once | ORAL | Status: AC
  Administered 2020-08-04: 5 mL via ORAL
  Filled 2020-08-04: qty 5

## 2020-08-04 NOTE — ED Notes (Signed)
E-signature not working at this time. Pt verbalized understanding of D/C instructions, prescriptions and follow up care with no further questions at this time. Pt in NAD and ambulatory at time of D/C.  

## 2020-08-04 NOTE — ED Notes (Signed)
Reference triage note. Pt presents with covid symptoms. Pt respirations even and unlabored at this time. Pt appears to be in NAD. Pt ambulatory to room.

## 2020-08-04 NOTE — Discharge Instructions (Signed)
Follow the urgent care instruction take medication as directed.

## 2020-08-04 NOTE — ED Provider Notes (Signed)
[facility] Emergency Department Provider Note   ____________________________________________   Event Date/Time   First MD Initiated Contact with Patient 08/04/20 [phone]     (approximate)  I have reviewed the triage vital signs and the nursing notes.   HISTORY  Chief Complaint Cough    HPI Morgan Stevens is a 54 y.o. female patient presents with cough dyspnea.  Patient test positive for COVID-19 on 07/25/2020.  Patient complaint chest wall pain when she takes deep breath or cough.  Patient denies fever chills associated with complaint.  Patient rates her pain/discomfort as 8/10.  Patient  describes pain as "achy".  No palliative measure for complaint.         Past Medical History:  Diagnosis Date  . Gastritis   . Hiatal hernia   . Obesity (BMI 30-39.9)   . Reflux   . Thyroid disease     Patient Active Problem List   Diagnosis Date Noted  . Stricture and stenosis of esophagus   . Columnar epithelial-lined lower esophagus   . Stomach irritation   . Gastric polyp   . Esophageal dysphagia   . Encounter for screening colonoscopy   . Diverticulosis of large intestine without diverticulitis   . Conversion disorder 06/11/2018  . Esophagitis, [place] grade A 09/06/2017  . Hiatal hernia 09/06/2017  . Schatzki's ring of distal esophagus 09/06/2017  . Other constipation 07/05/2017  . Sciatica 03/27/2017  . Pain in the chest   . Abnormal cardiac function test 02/26/2016  . Obesity (BMI 30-39.9)   . Angina pectoris (HCC) 02/25/2016  . Dyspnea 02/25/2016  . Hypothyroidism 02/25/2016  . 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 Chest pain 09/07/2015  . GERD (gastroesophageal reflux disease) 01/21/2014  . Gestational diabetes 01/21/2014  . Tonsillar hypertrophy 01/21/2014  . 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 CARDIAC CATHETERIZATION  02/27/2016   Procedure: Left Heart Cath and  Coronary Angiography;  Surgeon: Corky Crafts, MD;  Location: [facility] INVASIVE CV LAB;  Service: Cardiovascular;;  . CESAREAN SECTION    . CHOLECYSTECTOMY    . COLONOSCOPY WITH PROPOFOL N/A 04/15/2019   Procedure: COLONOSCOPY WITH PROPOFOL;  Surgeon: Pasty Spillers, MD;  Location: [facility] ENDOSCOPY;  Service: Endoscopy;  Laterality: N/A;  . ESOPHAGOGASTRODUODENOSCOPY (EGD) WITH PROPOFOL N/A 04/15/2019   Procedure: ESOPHAGOGASTRODUODENOSCOPY (EGD) WITH PROPOFOL;  Surgeon: Pasty Spillers, MD;  Location: [facility] ENDOSCOPY;  Service: Endoscopy;  Laterality: N/A;  . THYROIDECTOMY    . THYROIDECTOMY      Prior to Admission medications   Medication Sig Start Date End Date Taking? Authorizing Provider  chlorpheniramine-HYDROcodone (TUSSIONEX PENNKINETIC ER) 10-8 MG/5ML SUER Take 5 mLs by mouth 2 (two) times daily. 08/04/20   Joni Reining, PA-C  cyclobenzaprine (FLEXERIL) 5 MG tablet Take 1 tablet (5 mg total) by mouth 3 (three) times daily as needed. Patient taking differently: Take 5 mg by mouth 3 (three) times daily as needed for muscle spasms.  11/15/19   Menshew, Charlesetta Ivory, PA-C  fluticasone (FLONASE) 50 MCG/ACT nasal spray Place 2 sprays into both nostrils daily. 09/14/19 09/13/20  [provider]  levothyroxine (SYNTHROID, LEVOTHROID) 137 MCG tablet Take 137 mcg by mouth daily before breakfast.    [provider]  methylPREDNISolone (MEDROL DOSEPAK) 4 MG TBPK tablet Take Tapered dose as directed 08/04/20   Joni Reining, PA-C  omeprazole (PRILOSEC) 20 MG capsule Take 20 mg by mouth 2 (two) times daily before  a meal.    [provider]    Allergies Patient has no known allergies.  Family History  Problem Relation Age of Onset  . Breast cancer Neg Hx     Social History Social History   Tobacco Use  . Smoking status: Never Smoker  . Smokeless tobacco: Never Used  Vaping Use  . Vaping Use: Never used  Substance Use Topics  . Alcohol use: No     Alcohol/week: 0.0 standard drinks  . Drug use: No    Review of Systems Constitutional: No fever/chills Eyes: No visual changes. ENT: No sore throat. Cardiovascular: Denies chest pain. Respiratory: Denies shortness of breath. Gastrointestinal: No abdominal pain.  No nausea, no vomiting.  No diarrhea.  No constipation. Genitourinary: Negative for dysuria. Musculoskeletal: Negative for back pain. Skin: Negative for rash. Neurological: Negative for headaches, focal weakness or numbness. Endocrine:  Hypothyroidism.   ____________________________________________   PHYSICAL EXAM:  VITAL SIGNS: ED Triage Vitals  Enc Vitals Group     BP 08/04/20 0434 132/82     Pulse Rate 08/04/20 0433 81     Resp 08/04/20 0433 20     Temp 08/04/20 0433 98.7 F (37.1 C)     Temp Source 08/04/20 0433 Oral     SpO2 08/04/20 0433 99 %     Weight 08/04/20 0433 218 lb (98.9 kg)     Height 08/04/20 0433 5\' 4"  (1.626 m)     Head Circumference --      Peak Flow --      Pain Score 08/04/20 0433 8     Pain Loc --      Pain Edu? --      Excl. in GC? --    Constitutional: Alert and oriented. Well appearing and in no acute distress. Eyes: Conjunctivae are normal. PERRL. EOMI. Head: Atraumatic. Nose: No congestion/rhinnorhea. Mouth/Throat: Mucous membranes are moist.  Oropharynx non-erythematous. Neck: No stridor.  Hematological/Lymphatic/Immunilogical: No cervical lymphadenopathy. Cardiovascular: Normal rate, regular rhythm. Grossly normal heart sounds.  Good peripheral circulation. Respiratory: Normal respiratory effort.  No retractions. Lungs CTAB. Gastrointestinal: Soft and nontender. No distention. No abdominal bruits. No CVA tenderness. Genitourinary: Deferred Musculoskeletal: No lower extremity tenderness nor edema.  No joint effusions. Neurologic:  Normal speech and language. No gross focal neurologic deficits are appreciated. No gait instability. Skin:  Skin is warm, dry and intact. No rash  noted. Psychiatric: Mood and affect are normal. Speech and behavior are normal.  ____________________________________________   LABS (all labs ordered are listed, but only abnormal results are displayed)  Labs Reviewed  RESP PANEL BY RT-PCR (FLU A&B, COVID) ARPGX2 - Abnormal; Notable for the following components:      Result Value   SARS Coronavirus 2 by RT PCR POSITIVE (*)    All other components within normal limits   ____________________________________________  EKG   ____________________________________________  RADIOLOGY I, 14/09/21, personally viewed and evaluated these images (plain radiographs) as part of my medical decision making, as well as reviewing the written report by the radiologist.  ED MD interpretation:    Official radiology report(s): DG Chest 2 View  Result Date: 08/04/2020 CLINICAL DATA:  Cough. COVID positive. Chest wall pain when coughing EXAM: CHEST - 2 VIEW COMPARISON:  11/22/2019 FINDINGS: Normal heart size and mediastinal contours. No acute infiltrate or edema. No effusion or pneumothorax. No acute osseous findings. IMPRESSION: Negative chest. Electronically Signed   By: 11/24/2019 M.D.   On: 08/04/2020 05:11    ____________________________________________  PROCEDURES  Procedure(s) performed (including Critical Care):  Procedures   ____________________________________________   INITIAL IMPRESSION / ASSESSMENT AND PLAN / ED COURSE  As part of my medical decision making, I reviewed the following data within the electronic MEDICAL RECORD NUMBER         Patient presents with nonproductive cough, body aches, chest wall pain secondary to coughing.  Discussed no acute findings on chest x-ray.  Patient physical exam is consistent with viral respiratory infection with cough.  Patient advised continue self quarantine and take medication as directed.  Follow-up PCP.      ____________________________________________   FINAL CLINICAL  IMPRESSION(S) / ED DIAGNOSES  Final diagnoses:  Viral URI with cough  COVID-19     ED Discharge Orders         Ordered    methylPREDNISolone (MEDROL DOSEPAK) 4 MG TBPK tablet        08/04/20 0804    chlorpheniramine-HYDROcodone (TUSSIONEX PENNKINETIC ER) 10-8 MG/5ML SUER  2 times daily        08/04/20 0804          *Please note:  Marita Burnsed was evaluated in Emergency Department on 08/04/2020 for the symptoms described in the history of present illness. She was evaluated in the context of the global COVID-19 pandemic, which necessitated consideration that the patient might be at risk for infection with the SARS-CoV-2 virus that causes COVID-19. Institutional protocols and algorithms that pertain to the evaluation of patients at risk for COVID-19 are in a state of rapid change based on information released by regulatory bodies including the CDC and federal and state organizations. These policies and algorithms were followed during the patient's care in the ED.  Some ED evaluations and interventions may be delayed as a result of limited staffing during and the pandemic.*   Note:  This document was prepared using Dragon voice recognition software and may include unintentional dictation errors.    Joni Reining, PA-C 08/04/20 7416    Dionne Bucy, MD 08/04/20 [phone]

## 2020-08-04 NOTE — ED Triage Notes (Addendum)
Pt in with co cough,  pt tested positive for Covid 11/29. Pt co chest wall pain when she coughs or takes a deep breath.

## 2020-08-05 ENCOUNTER — Telehealth: Payer: Self-pay

## 2020-08-05 ENCOUNTER — Telehealth: Payer: Self-pay | Admitting: Oncology

## 2020-08-05 ENCOUNTER — Encounter: Payer: Self-pay | Admitting: Oncology

## 2020-08-05 NOTE — Telephone Encounter (Signed)
Transition Care Management Unsuccessful Follow-up Telephone Call  Date of discharge and from where:  08/04/2020 [facility] ED  Attempts:  1st Attempt  Reason for unsuccessful TCM follow-up call:  Left voice message

## 2020-08-05 NOTE — Telephone Encounter (Signed)
Re: [facility]  Called to Discuss with patient about Covid symptoms and the use of regeneron, a monoclonal antibody infusion for those with mild to moderate Covid symptoms and at a high risk of hospitalization.     Pt is qualified for this infusion at the [facility] infusion center due to co-morbid conditions and/or a member of an at-risk group.    Past Medical History:  Diagnosis Date  . Gastritis   . Hiatal hernia   . Obesity (BMI 30-39.9)   . Reflux   . Thyroid disease     Specific risk condition-Obesity    Unable to reach pt. Left VM and MCM.  Mignon Pine, AGNP-C [phone] (Infusion Center Hotline)

## 2020-08-05 NOTE — Telephone Encounter (Signed)
Transition Care Management Follow-up Telephone Call  Date of discharge and from where: 08/04/2020 [facility] ED  How have you been since you were released from the hospital? Feeling better.   Any questions or concerns? No  Items Reviewed:  Did the pt receive and understand the discharge instructions provided? Yes   Medications obtained and verified? Yes   Other? No   Any new allergies since your discharge? No   Dietary orders reviewed? Yes  Do you have support at home? Yes    Functional Questionnaire: (I = Independent and D = Dependent) ADLs: I  Bathing/Dressing- I  Meal Prep- I  Eating- I  Maintaining continence- I  Transferring/Ambulation- I  Managing Meds- I  Follow up appointments reviewed:   PCP Hospital f/u appt confirmed? No  Patient stated she will follow up with Deon Pilling, MD once out of quarantine.   Specialist Hospital f/u appt confirmed? No    Are transportation arrangements needed? No   If their condition worsens, is the pt aware to call PCP or go to the Emergency Dept.? Yes  Was the patient provided with contact information for the PCP's office or ED? Yes  Was to pt encouraged to call back with questions or concerns? Yes

## 2020-09-21 DIAGNOSIS — Z79899 Other long term (current) drug therapy: Secondary | ICD-10-CM | POA: Diagnosis not present

## 2020-10-05 DIAGNOSIS — Z79899 Other long term (current) drug therapy: Secondary | ICD-10-CM | POA: Diagnosis not present

## 2020-10-14 ENCOUNTER — Emergency Department: Payer: Medicaid Other

## 2020-10-14 ENCOUNTER — Other Ambulatory Visit: Payer: Self-pay

## 2020-10-14 ENCOUNTER — Emergency Department
Admission: EM | Admit: 2020-10-14 | Discharge: 2020-10-14 | Disposition: A | Discharge status: Home / Self Care | Payer: Medicaid Other | Attending: Emergency Medicine | Admitting: Emergency Medicine

## 2020-10-14 ENCOUNTER — Encounter: Payer: Self-pay | Admitting: Emergency Medicine

## 2020-10-14 DIAGNOSIS — R079 Chest pain, unspecified: Secondary | ICD-10-CM | POA: Diagnosis not present

## 2020-10-14 DIAGNOSIS — Z79899 Other long term (current) drug therapy: Secondary | ICD-10-CM | POA: Insufficient documentation

## 2020-10-14 DIAGNOSIS — Z8616 Personal history of COVID-19: Secondary | ICD-10-CM | POA: Diagnosis not present

## 2020-10-14 DIAGNOSIS — E039 Hypothyroidism, unspecified: Secondary | ICD-10-CM | POA: Diagnosis not present

## 2020-10-14 DIAGNOSIS — R0789 Other chest pain: Secondary | ICD-10-CM

## 2020-10-14 LAB — BASIC METABOLIC PANEL
Anion gap: 10 (ref 5–15)
BUN: 8 mg/dL (ref 6–20)
CO2: 25 mmol/L (ref 22–32)
Calcium: 9 mg/dL (ref 8.9–10.3)
Chloride: 104 mmol/L (ref 98–111)
Creatinine, Ser: 0.7 mg/dL (ref 0.44–1.00)
GFR, Estimated: >60 mL/min (ref >60)
Glucose, Bld: 106 mg/dL — ABNORMAL HIGH (ref 70–99)
Potassium: 3.6 mmol/L (ref 3.5–5.1)
Sodium: 139 mmol/L (ref 135–145)

## 2020-10-14 LAB — CBC
HCT: 38.3 % (ref 36.0–46.0)
Hemoglobin: 13.5 g/dL (ref 12.0–15.0)
MCH: 30.6 pg (ref 26.0–34.0)
MCHC: 35.2 g/dL (ref 30.0–36.0)
MCV: 86.8 fL (ref 80.0–100.0)
Platelets: 330 10*3/uL (ref 150–400)
RBC: 4.41 MIL/uL (ref 3.87–5.11)
RDW: 12.5 % (ref 11.5–15.5)
WBC: 5.6 10*3/uL (ref 4.0–10.5)
nRBC: 0 % (ref 0.0–0.2)

## 2020-10-14 LAB — TROPONIN I (HIGH SENSITIVITY)
Troponin I (High Sensitivity): <2 ng/L (ref <18)
Troponin I (High Sensitivity): <2 ng/L (ref <18)

## 2020-10-14 LAB — D-DIMER, QUANTITATIVE: D-Dimer, Quant: 0.35 ug/mL-FEU (ref 0.00–0.50)

## 2020-10-14 IMAGING — CR DG CHEST 2V
1 series · 2 of 2 positions shown · non-contrast
Comparison: [DATE]

CLINICAL DATA: Chest pain

EXAM:
CHEST - 2 VIEW

[Series 1: dg chest 2 view · 0.14mm/px · 2 of 2 slices shown]
[im 1/2]
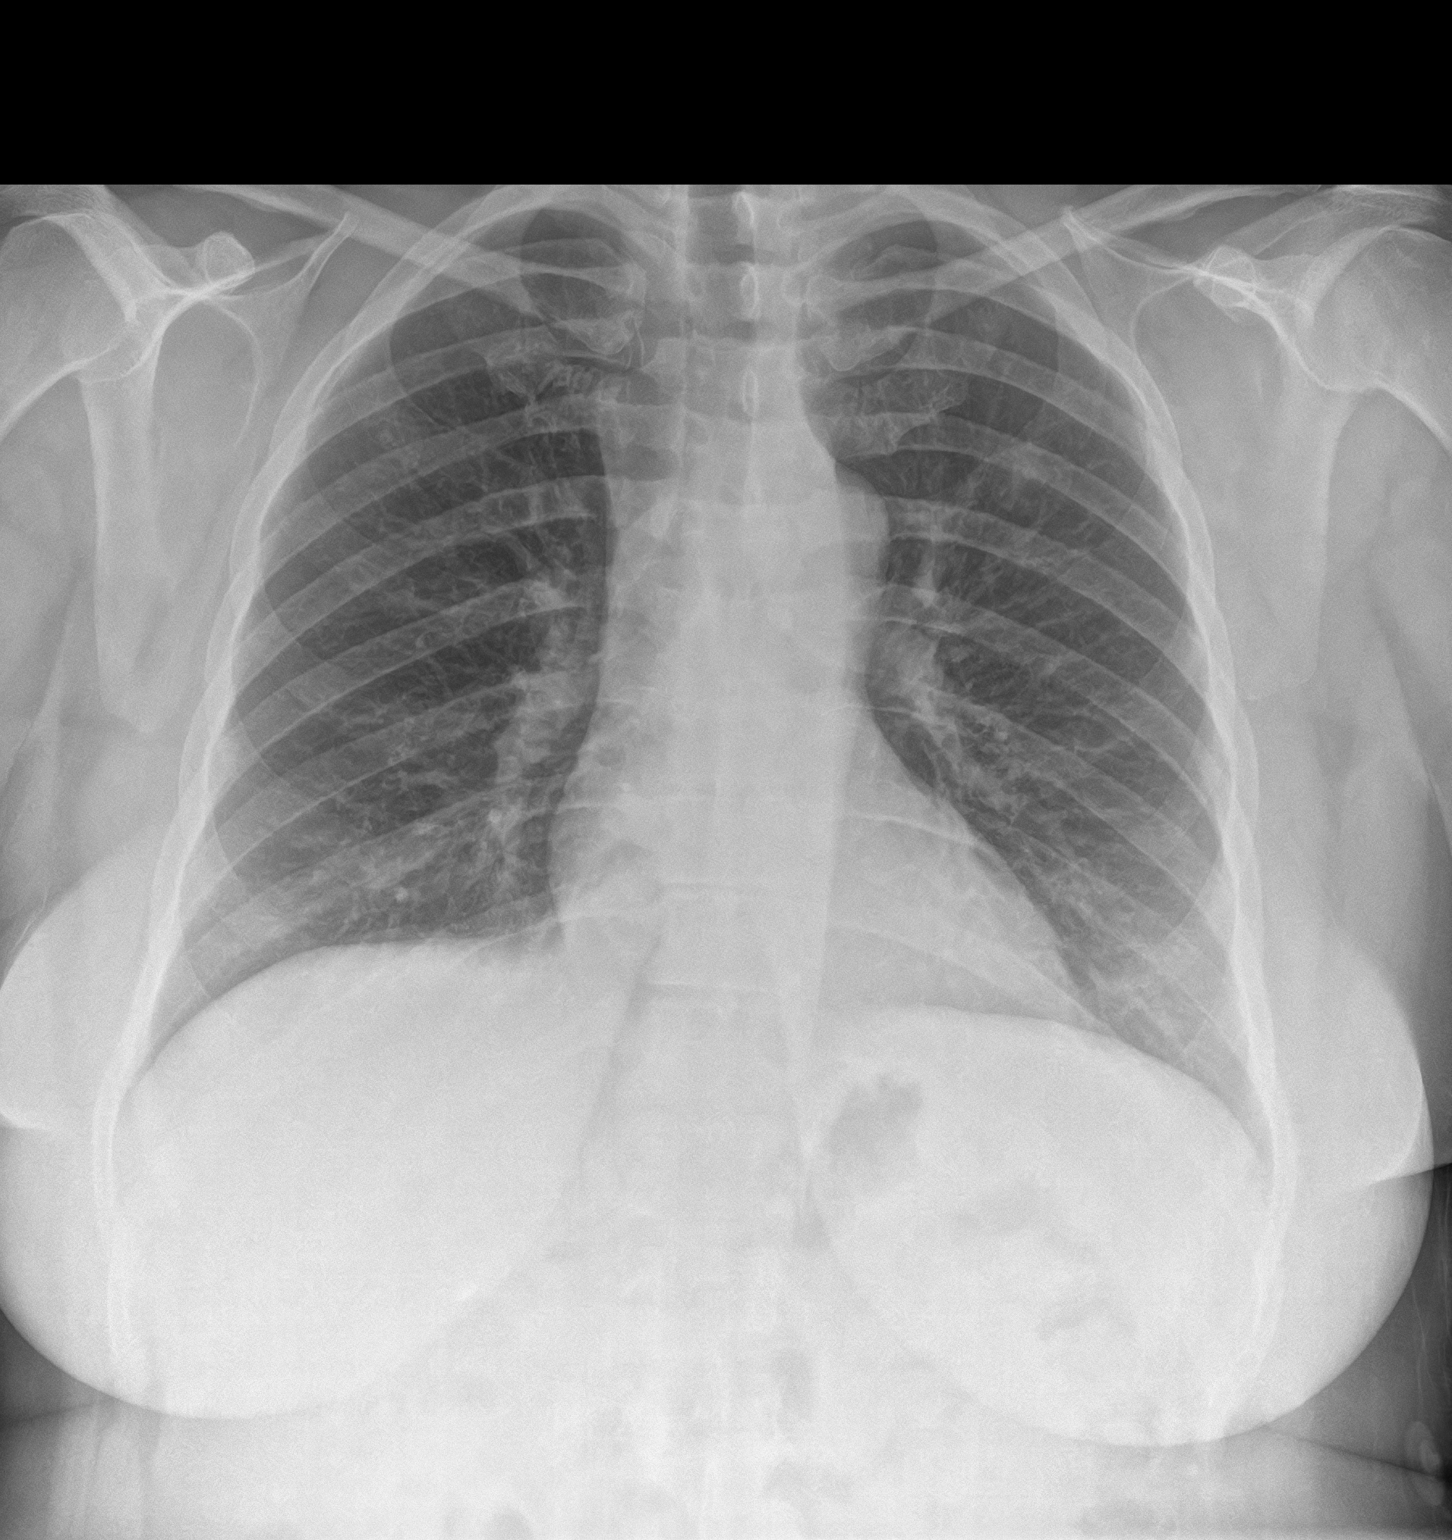
[im 2/2]
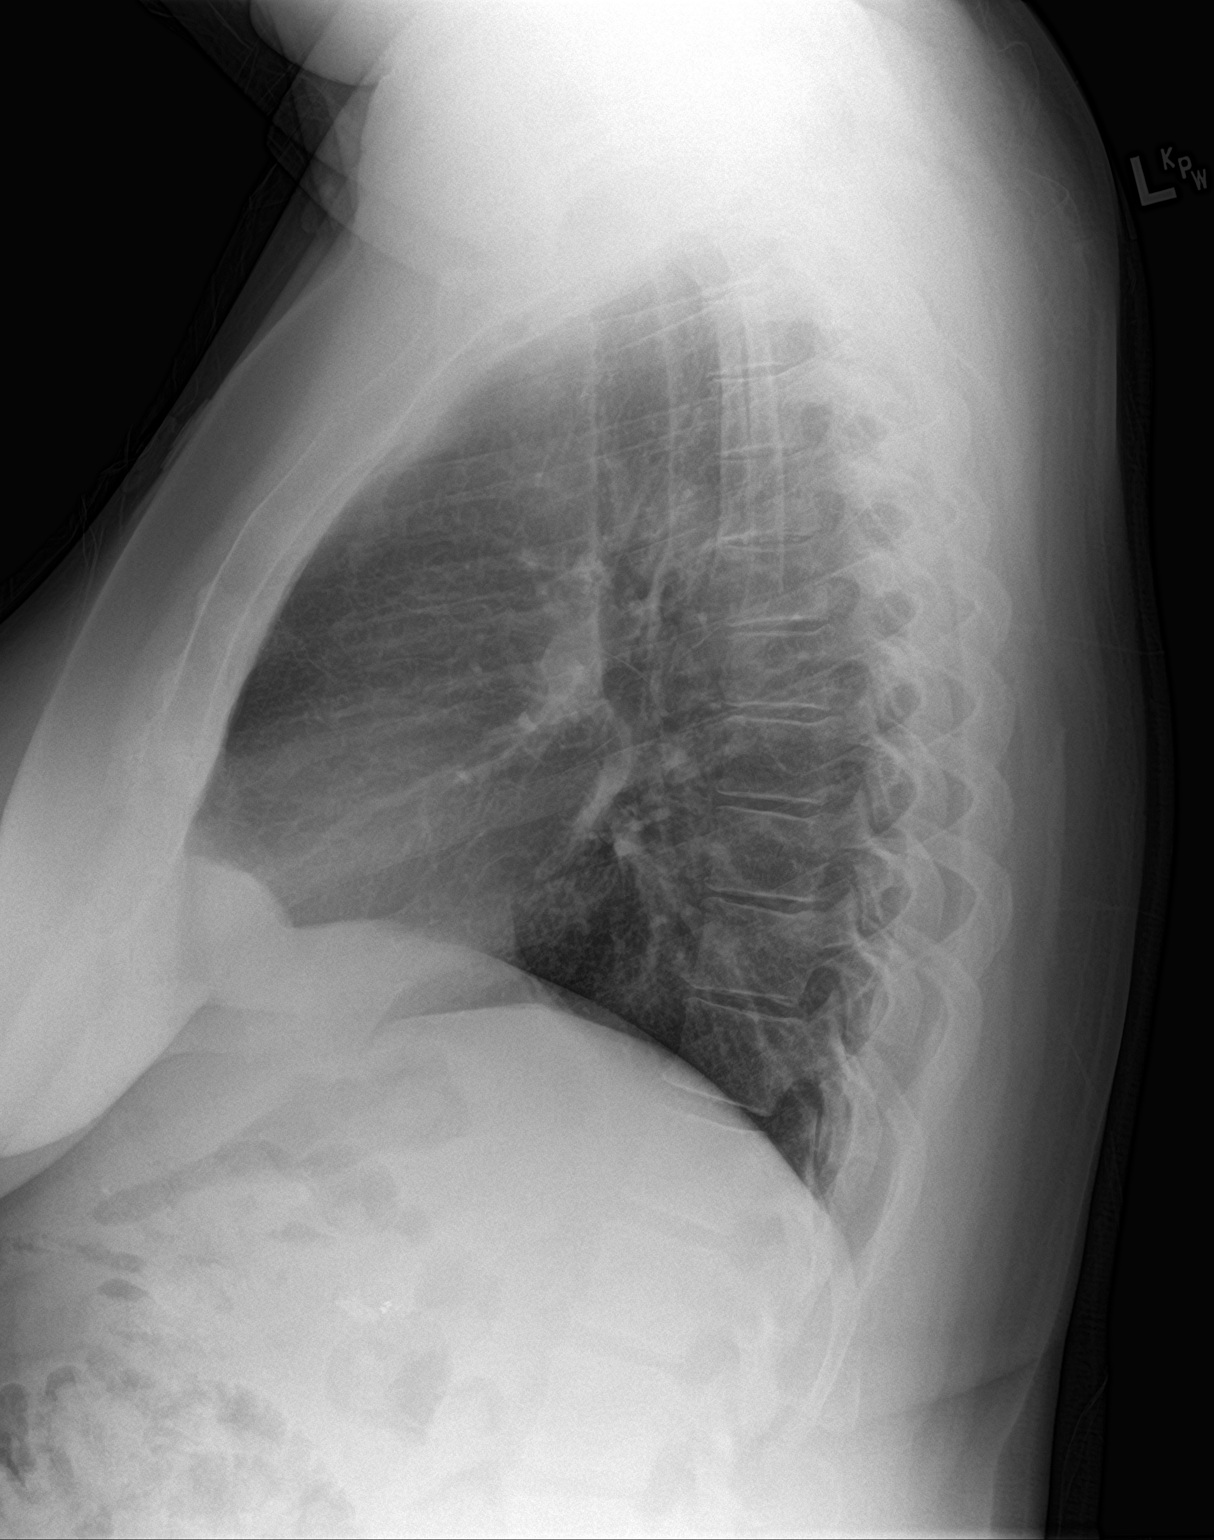

[2 of 2 positions shown; findings below may reference images not displayed]

FINDINGS: The heart size and mediastinal contours are within normal limits.
Both lungs are clear. The visualized skeletal structures are
unremarkable.
IMPRESSION: No active cardiopulmonary disease.

## 2020-10-14 MED ORDER — KETOROLAC TROMETHAMINE 30 MG/ML IJ SOLN
15.0000 mg | Freq: Once | INTRAMUSCULAR | Status: AC
  Administered 2020-10-14: 15 mg via INTRAVENOUS
  Filled 2020-10-14: qty 1

## 2020-10-14 MED ORDER — NAPROXEN 375 MG PO TABS: 375.0000 mg | ORAL_TABLET | Freq: Two times a day (BID) | ORAL | 0 refills | Status: AC

## 2020-10-14 NOTE — ED Provider Notes (Signed)
[facility] Emergency Department Provider Note  ____________________________________________   Event Date/Time   First MD Initiated Contact with Patient 10/14/20 1941     (approximate)  I have reviewed the triage vital signs and the nursing notes.   HISTORY  Chief Complaint Chest Pain    HPI Morgan Stevens is a 55 y.o. female  With h/o esophagitis, obesity, here with left-sided chest pain. Pt reports she was in [facility] today when she experienced acute onset of sharp, stabbing, left-sided CP. She denies any associated SOB, diaphoresis, nausea, vomiting. Pain was constant and now resolved. No overt worsening w/ movement, palpation, coughing, or deep inspiration. She did have COVID in December but no complications. No leg swelling. No h/o ACS. No other complaints. Pain improving now.         Past Medical History:  Diagnosis Date  . Gastritis   . Hiatal hernia   . Obesity (BMI 30-39.9)   . Reflux   . Thyroid disease     Patient Active Problem List   Diagnosis Date Noted  . Stricture and stenosis of esophagus   . Columnar epithelial-lined lower esophagus   . Stomach irritation   . Gastric polyp   . Esophageal dysphagia   . Encounter for screening colonoscopy   . Diverticulosis of large intestine without diverticulitis   . Conversion disorder 06/11/2018  . Esophagitis, [place] grade A 09/06/2017  . Hiatal hernia 09/06/2017  . Schatzki's ring of distal esophagus 09/06/2017  . Other constipation 07/05/2017  . Sciatica 03/27/2017  . Pain in the chest   . Abnormal cardiac function test 02/26/2016  . Obesity (BMI 30-39.9)   . Angina pectoris (HCC) 02/25/2016  . Dyspnea 02/25/2016  . Hypothyroidism 02/25/2016  . 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 Chest pain 09/07/2015  . GERD (gastroesophageal reflux disease) 01/21/2014  . Gestational diabetes 01/21/2014  . Tonsillar hypertrophy 01/21/2014  . 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 CARDIAC CATHETERIZATION  02/27/2016   Procedure: Left Heart Cath and Coronary Angiography;  Surgeon: Corky Crafts, MD;  Location: [facility] INVASIVE CV LAB;  Service: Cardiovascular;;  . CESAREAN SECTION    . CHOLECYSTECTOMY    . COLONOSCOPY WITH PROPOFOL N/A 04/15/2019   Procedure: COLONOSCOPY WITH PROPOFOL;  Surgeon: Pasty Spillers, MD;  Location: [facility] ENDOSCOPY;  Service: Endoscopy;  Laterality: N/A;  . ESOPHAGOGASTRODUODENOSCOPY (EGD) WITH PROPOFOL N/A 04/15/2019   Procedure: ESOPHAGOGASTRODUODENOSCOPY (EGD) WITH PROPOFOL;  Surgeon: Pasty Spillers, MD;  Location: [facility] ENDOSCOPY;  Service: Endoscopy;  Laterality: N/A;  . THYROIDECTOMY    . THYROIDECTOMY      Prior to Admission medications   Medication Sig Start Date End Date Taking? Authorizing Provider  naproxen (NAPROSYN) 375 MG tablet Take 1 tablet (375 mg total) by mouth 2 (two) times daily with a meal for 5 days. 10/14/20 10/19/20 Yes Shaune Pollack, MD  chlorpheniramine-HYDROcodone [facility] PENNKINETIC ER) 10-8 MG/5ML SUER Take 5 mLs by mouth 2 (two) times daily. 08/04/20   Joni Reining, PA-C  cyclobenzaprine (FLEXERIL) 5 MG tablet Take 1 tablet (5 mg total) by mouth 3 (three) times daily as needed. Patient taking differently: Take 5 mg by mouth 3 (three) times daily as needed for muscle spasms.  11/15/19   Menshew, Charlesetta Ivory, PA-C  fluticasone (FLONASE) 50 MCG/ACT nasal spray Place 2 sprays into both nostrils daily. 09/14/19 09/13/20  [provider]  levothyroxine (SYNTHROID, LEVOTHROID) 137 MCG tablet Take 137  mcg by mouth daily before breakfast.    [provider]  methylPREDNISolone (MEDROL DOSEPAK) 4 MG TBPK tablet Take Tapered dose as directed 08/04/20   Joni Reining, PA-C  omeprazole (PRILOSEC) 20 MG capsule Take 20 mg by mouth 2 (two) times daily before a meal.    [provider]    Allergies Patient has no known  allergies.  Family History  Problem Relation Age of Onset  . Breast cancer Neg Hx     Social History Social History   Tobacco Use  . Smoking status: Never Smoker  . Smokeless tobacco: Never Used  Vaping Use  . Vaping Use: Never used  Substance Use Topics  . Alcohol use: No    Alcohol/week: 0.0 standard drinks  . Drug use: No    Review of Systems  Review of Systems  Constitutional: Negative for fatigue and fever.  HENT: Negative for congestion and sore throat.   Eyes: Negative for visual disturbance.  Respiratory: Negative for cough and shortness of breath.   Cardiovascular: Positive for chest pain.  Gastrointestinal: Negative for abdominal pain, diarrhea, nausea and vomiting.  Genitourinary: Negative for flank pain.  Musculoskeletal: Negative for back pain and neck pain.  Skin: Negative for rash and wound.  Neurological: Negative for weakness.  All other systems reviewed and are negative.    ____________________________________________  PHYSICAL EXAM:      VITAL SIGNS: ED Triage Vitals  Enc Vitals Group     BP 10/14/20 1749 (!) 151/90     Pulse Rate 10/14/20 1749 80     Resp 10/14/20 1749 16     Temp 10/14/20 1749 98.3 F (36.8 C)     Temp Source 10/14/20 1749 Oral     SpO2 10/14/20 1749 100 %     Weight 10/14/20 1743 228 lb (103.4 kg)     Height 10/14/20 1743 5\' 4"  (1.626 m)     Head Circumference --      Peak Flow --      Pain Score 10/14/20 1743 8     Pain Loc --      Pain Edu? --      Excl. in GC? --      Physical Exam Vitals and nursing note reviewed.  Constitutional:      General: She is not in acute distress.    Appearance: She is well-developed and well-nourished.  HENT:     Head: Normocephalic and atraumatic.  Eyes:     Conjunctiva/sclera: Conjunctivae normal.  Cardiovascular:     Rate and Rhythm: Normal rate and regular rhythm.     Heart sounds: Normal heart sounds.  Pulmonary:     Effort: Pulmonary effort is normal. No respiratory  distress.     Breath sounds: No wheezing.  Chest:     Comments: No significant chest wall TTP. No deformity. No rashes or skin lesions. Abdominal:     General: There is no distension.  Musculoskeletal:        General: No edema.     Cervical back: Neck supple.  Skin:    General: Skin is warm.     Capillary Refill: Capillary refill takes less than 2 seconds.     Findings: No rash.  Neurological:     Mental Status: She is alert and oriented to person, place, and time.     Motor: No abnormal muscle tone.       ____________________________________________   LABS (all labs ordered are listed, but only abnormal results are  displayed)  Labs Reviewed  BASIC METABOLIC PANEL - Abnormal; Notable for the following components:      Result Value   Glucose, Bld 106 (*)    All other components within normal limits  CBC  D-DIMER, QUANTITATIVE (NOT AT [facility])  POC URINE PREG, ED  TROPONIN I (HIGH SENSITIVITY)  TROPONIN I (HIGH SENSITIVITY)    ____________________________________________  EKG: Normal sinus rhythm, VR 80. PR 148, QRS 78, QTc 465. No acute St elevations or depressions. No ischemia or infarct. ________________________________________  RADIOLOGY All imaging, including plain films, CT scans, and ultrasounds, independently reviewed by me, and interpretations confirmed via formal radiology reads.  ED MD interpretation:   CXR: Clear  Official radiology report(s): DG Chest 2 View  Result Date: 10/14/2020 CLINICAL DATA:  Chest pain EXAM: CHEST - 2 VIEW COMPARISON:  08/04/2020 FINDINGS: The heart size and mediastinal contours are within normal limits. Both lungs are clear. The visualized skeletal structures are unremarkable. IMPRESSION: No active cardiopulmonary disease. Electronically Signed   By: Jasmine Pang M.D.   On: 10/14/2020 18:11    ____________________________________________  PROCEDURES   Procedure(s) performed (including Critical  Care):  Procedures  ____________________________________________  INITIAL IMPRESSION / MDM / ASSESSMENT AND PLAN / ED COURSE  As part of my medical decision making, I reviewed the following data within the electronic MEDICAL RECORD NUMBER Nursing notes reviewed and incorporated, Old chart reviewed, Notes from prior ED visits, and [place] Controlled Substance Database       *Morgan Stevens was evaluated in Emergency Department on 10/14/2020 for the symptoms described in the history of present illness. She was evaluated in the context of the global COVID-19 pandemic, which necessitated consideration that the patient might be at risk for infection with the SARS-CoV-2 virus that causes COVID-19. Institutional protocols and algorithms that pertain to the evaluation of patients at risk for COVID-19 are in a state of rapid change based on information released by regulatory bodies including the CDC and federal and state organizations. These policies and algorithms were followed during the patient's care in the ED.  Some ED evaluations and interventions may be delayed as a result of limited staffing during the pandemic.*     Medical Decision Making:  55 yo F here with atypical left sided chest pain. EKG non ischemic and trop neg x 2 - do not suspect ACS. D-Dimer negative and do not suspect PE, dissection clinically, and pt is satting well on RA in NAD. Lytes, labs otherwise unremarkable. Will treat for possible MSK chest wall pain, d/c with outpt follow-up. Abdomen soft, no apparent referred pain from GI source.  ____________________________________________  FINAL CLINICAL IMPRESSION(S) / ED DIAGNOSES  Final diagnoses:  Atypical chest pain     MEDICATIONS GIVEN DURING THIS VISIT:  Medications  ketorolac (TORADOL) 30 MG/ML injection 15 mg (15 mg Intravenous Given 10/14/20 2013)     ED Discharge Orders         Ordered    naproxen (NAPROSYN) 375 MG tablet  2 times daily with meals        10/14/20 2104            Note:  This document was prepared using Dragon voice recognition software and may include unintentional dictation errors.   Shaune Pollack, MD 10/14/20 2233

## 2020-10-14 NOTE — ED Notes (Signed)
Pt updated. Delay on d-dimer results explained. Pt resting calmly in bed. In NAD.

## 2020-10-14 NOTE — ED Notes (Signed)
Pt to be assessed once back to 19H from triage. Not currently present.

## 2020-10-14 NOTE — ED Notes (Signed)
EDP Isaacs at bedside 

## 2020-10-14 NOTE — ED Notes (Signed)
Pt in with L-sided CP. Denies fever, SOB, diaphoresis, N/V/D. Skin dry; resp reg/unlabored. Pt denies anxiety. Sitting calmly in bed. Ambulatory from triage; steady.

## 2020-10-14 NOTE — ED Triage Notes (Signed)
Pt to ED via POV, states sudden onset L sided CP that started approx 20 minutes ago while walking around in [employer]. Pt denies radiation at this time. Pt describes pain as sharp at this time.

## 2020-10-23 DIAGNOSIS — I639 Cerebral infarction, unspecified: Secondary | ICD-10-CM | POA: Diagnosis not present

## 2020-10-23 DIAGNOSIS — R9431 Abnormal electrocardiogram [ECG] [EKG]: Secondary | ICD-10-CM | POA: Diagnosis not present

## 2020-11-20 ENCOUNTER — Emergency Department: Payer: Medicaid Other

## 2020-11-20 ENCOUNTER — Observation Stay
Admission: EM | Admit: 2020-11-20 | Discharge: 2020-11-21 | Disposition: A | Discharge status: Home / Self Care | Payer: Medicaid Other | Attending: Internal Medicine | Admitting: Internal Medicine

## 2020-11-20 DIAGNOSIS — I639 Cerebral infarction, unspecified: Secondary | ICD-10-CM | POA: Diagnosis not present

## 2020-11-20 DIAGNOSIS — G459 Transient cerebral ischemic attack, unspecified: Secondary | ICD-10-CM

## 2020-11-20 DIAGNOSIS — R29818 Other symptoms and signs involving the nervous system: Secondary | ICD-10-CM | POA: Diagnosis not present

## 2020-11-20 DIAGNOSIS — R202 Paresthesia of skin: Secondary | ICD-10-CM

## 2020-11-20 DIAGNOSIS — Z79899 Other long term (current) drug therapy: Secondary | ICD-10-CM | POA: Insufficient documentation

## 2020-11-20 DIAGNOSIS — E039 Hypothyroidism, unspecified: Secondary | ICD-10-CM | POA: Diagnosis not present

## 2020-11-20 DIAGNOSIS — Z20822 Contact with and (suspected) exposure to covid-19: Secondary | ICD-10-CM | POA: Insufficient documentation

## 2020-11-20 DIAGNOSIS — G458 Other transient cerebral ischemic attacks and related syndromes: Secondary | ICD-10-CM | POA: Diagnosis not present

## 2020-11-20 DIAGNOSIS — I10 Essential (primary) hypertension: Secondary | ICD-10-CM

## 2020-11-20 DIAGNOSIS — R200 Anesthesia of skin: Secondary | ICD-10-CM

## 2020-11-20 LAB — DIFFERENTIAL
Abs Immature Granulocytes: 0.02 10*3/uL (ref 0.00–0.07)
Basophils Absolute: 0.1 10*3/uL (ref 0.0–0.1)
Basophils Relative: 1 %
Eosinophils Absolute: 0.2 10*3/uL (ref 0.0–0.5)
Eosinophils Relative: 2 %
Immature Granulocytes: 0 %
Lymphocytes Relative: 53 %
Lymphs Abs: 4 10*3/uL (ref 0.7–4.0)
Monocytes Absolute: 0.6 10*3/uL (ref 0.1–1.0)
Monocytes Relative: 8 %
Neutro Abs: 2.8 10*3/uL (ref 1.7–7.7)
Neutrophils Relative %: 36 %

## 2020-11-20 LAB — CBC
HCT: 37.8 % (ref 36.0–46.0)
Hemoglobin: 13.2 g/dL (ref 12.0–15.0)
MCH: 30.9 pg (ref 26.0–34.0)
MCHC: 34.9 g/dL (ref 30.0–36.0)
MCV: 88.5 fL (ref 80.0–100.0)
Platelets: 281 10*3/uL (ref 150–400)
RBC: 4.27 MIL/uL (ref 3.87–5.11)
RDW: 12.8 % (ref 11.5–15.5)
WBC: 7.6 10*3/uL (ref 4.0–10.5)
nRBC: 0 % (ref 0.0–0.2)

## 2020-11-20 LAB — CBG MONITORING, ED: Glucose-Capillary: 115 mg/dL — ABNORMAL HIGH (ref 70–99)

## 2020-11-20 IMAGING — CT CT HEAD CODE STROKE
3 series · 15 of 47 positions shown, 18 images · non-contrast
Comparison: [DATE]

CLINICAL DATA: Code stroke.  Right facial numbness

EXAM:
CT HEAD WITHOUT CONTRAST
TECHNIQUE: Contiguous axial images were obtained from the base of the skull
through the vertex without intravenous contrast.

[Series 3: head wo · axial · 0.45mm/px · z∈[-164,-29]mm · 9 of 33 slices shown, 12 images]
[im 3/33  brain]
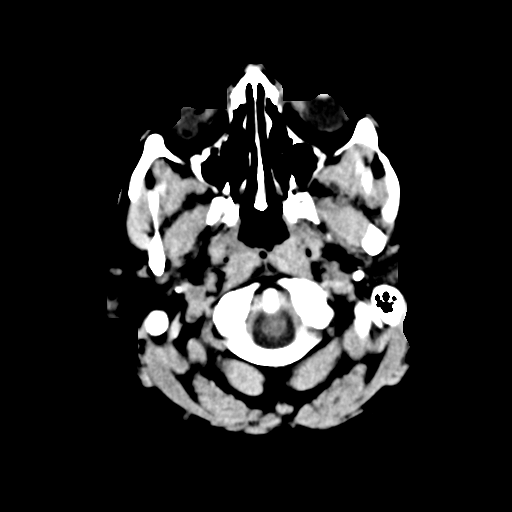
[im 3/33  bone]
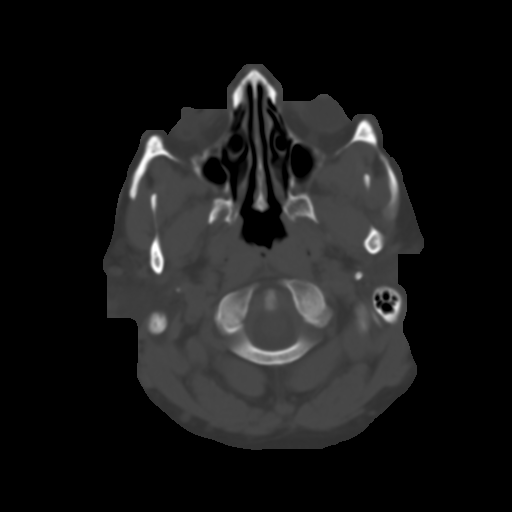
[im 6/33  brain]
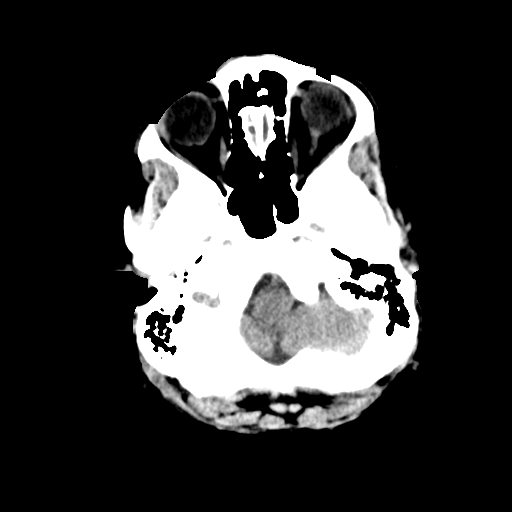
[im 9/33  brain]
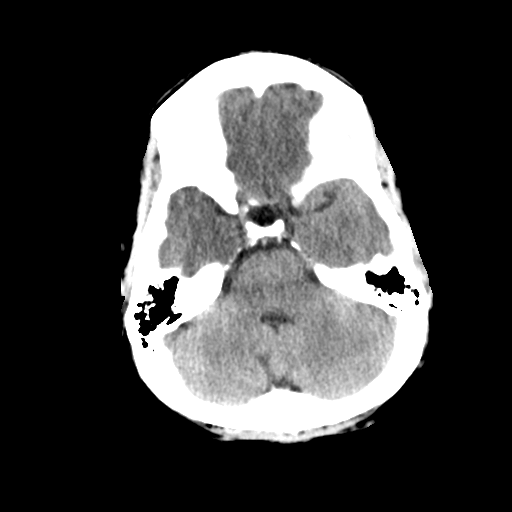
[im 13/33  brain]
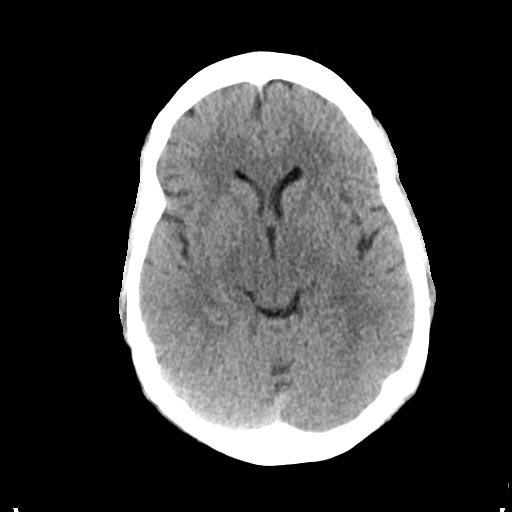
[im 17/33  brain]
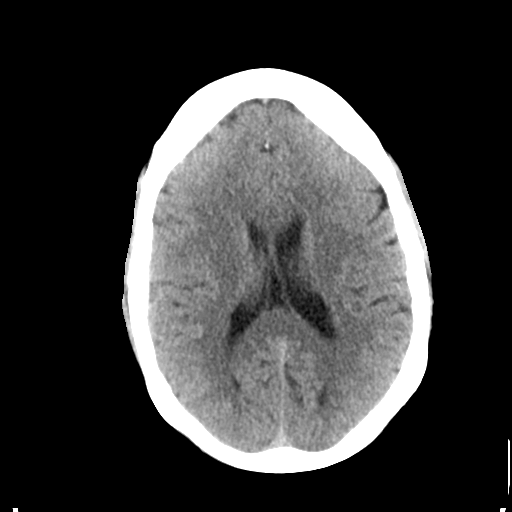
[im 17/33  bone]
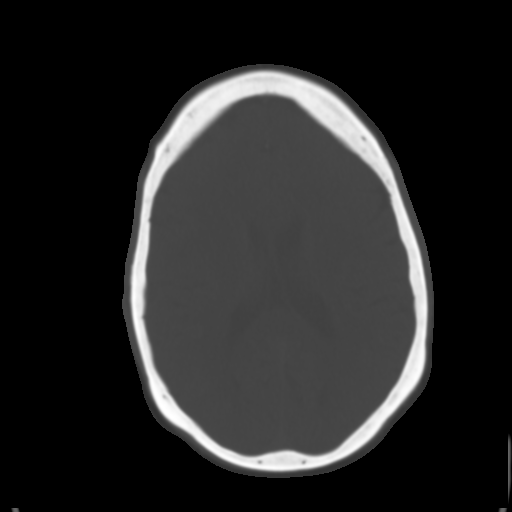
[im 20/33  brain]
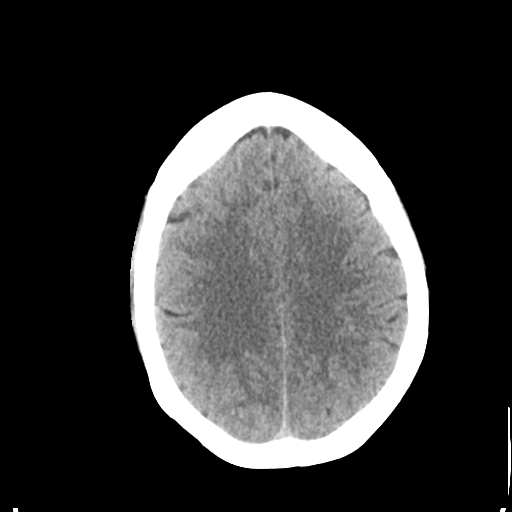
[im 24/33  brain]
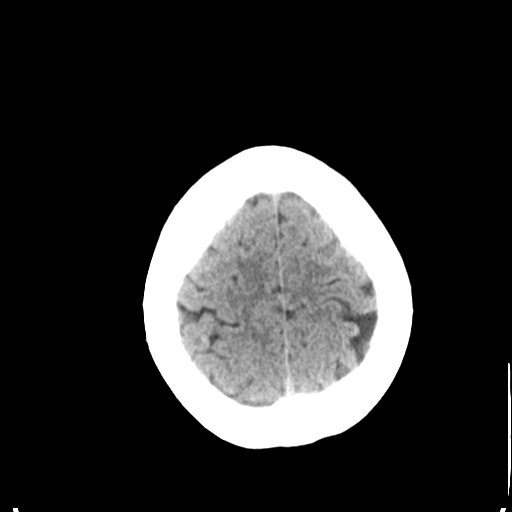
[im 27/33  brain]
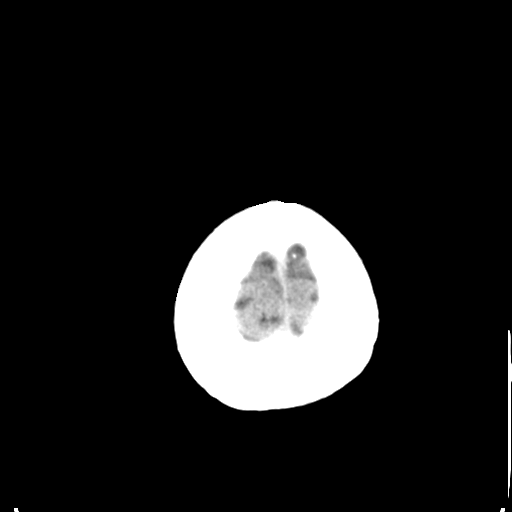
[im 30/33  brain]
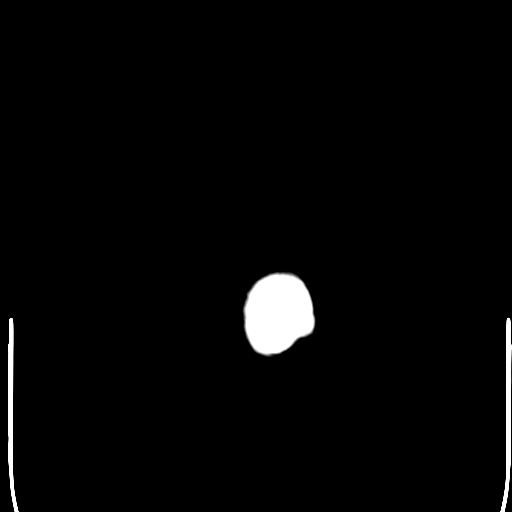
[im 30/33  bone]
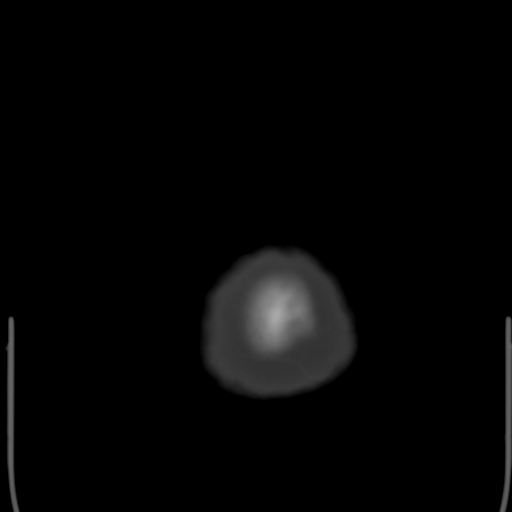

[Series 5: coronal soft tissue · coronal · 0.32mm/px · 3 of 71 slices shown]
[im 24/71  brain]
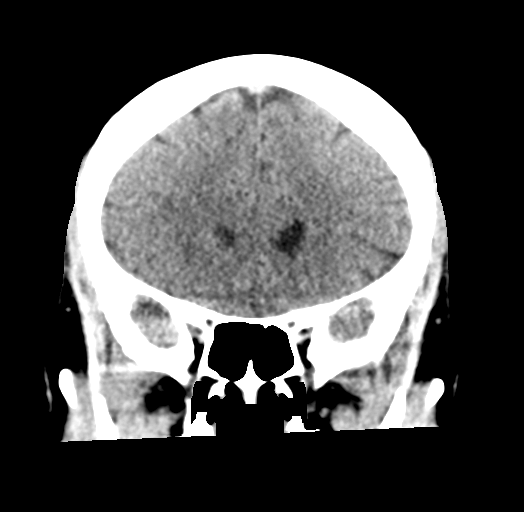
[im 32/71  brain]
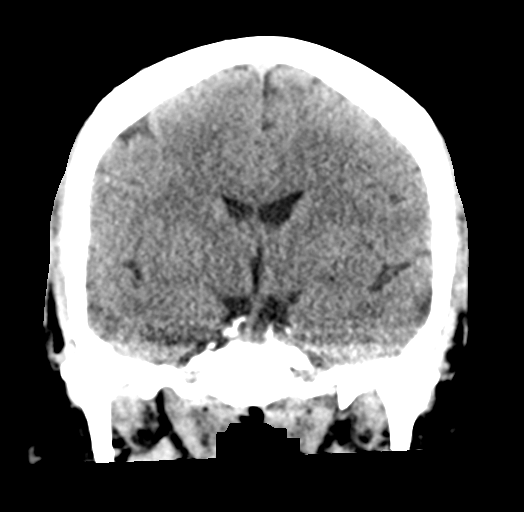
[im 39/71  brain]
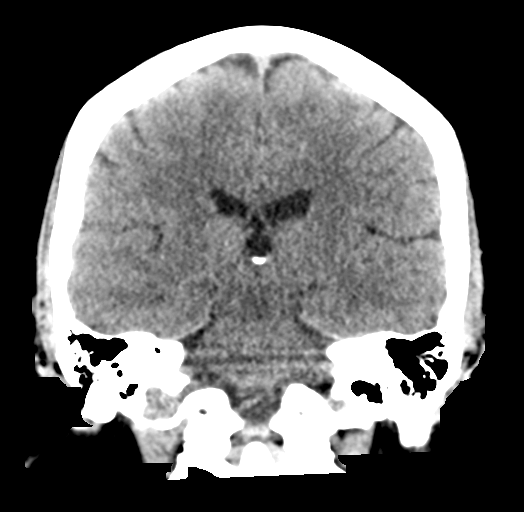

[Series 6: sagittal soft tissue · sagittal · 0.33mm/px · 3 of 57 slices shown]
[im 19/57  brain]
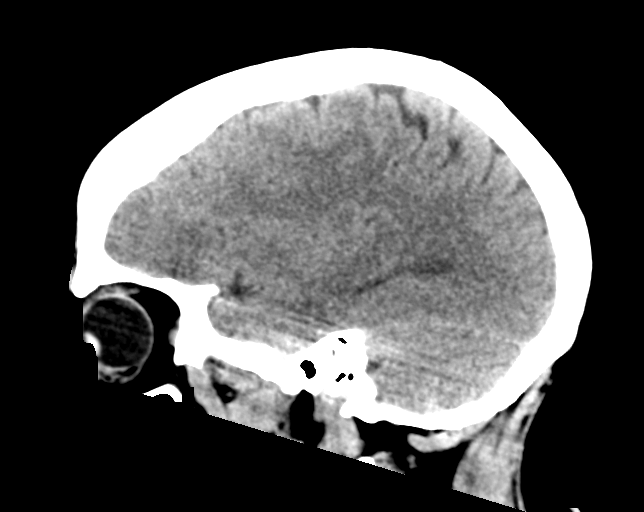
[im 29/57  brain]
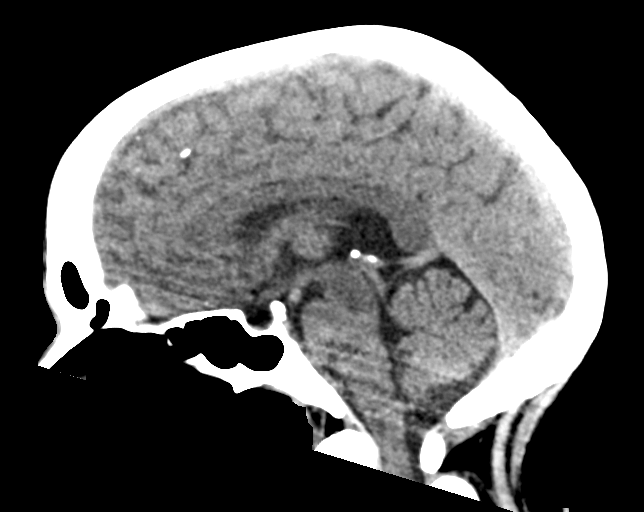
[im 38/57  brain]
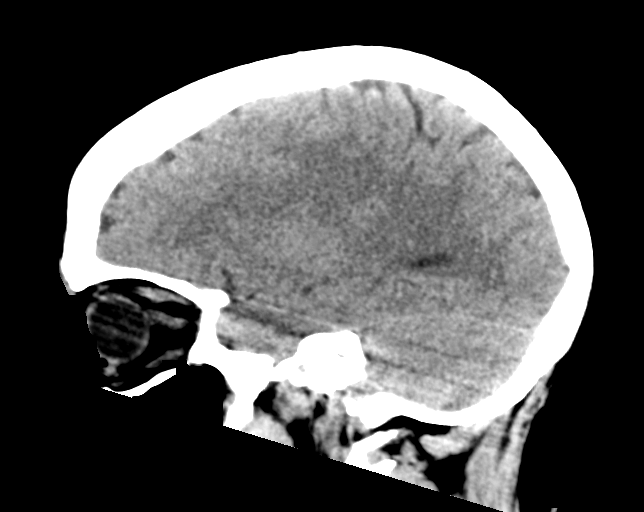

[15 of 47 positions shown; findings below may reference images not displayed]

FINDINGS: Brain: There is no mass, hemorrhage or extra-axial collection. The
size and configuration of the ventricles and extra-axial CSF spaces
are normal. The brain parenchyma is normal, without evidence of
acute or chronic infarction.

Vascular: No abnormal hyperdensity of the major intracranial
arteries or dural venous sinuses. No intracranial atherosclerosis.

Skull: The visualized skull base, calvarium and extracranial soft
tissues are normal.

Sinuses/Orbits: No fluid levels or advanced mucosal thickening of
the visualized paranasal sinuses. No mastoid or middle ear effusion.
The orbits are normal.

ASPECTS ([place]berta Stroke Program Early CT Score)

- Ganglionic level infarction (caudate, lentiform nuclei, internal
capsule, insula, M1-M3 cortex): 7

- Supraganglionic infarction (M4-M6 cortex): 3

Total score (0-10 with 10 being normal): 10
IMPRESSION: 1. Normal head CT.
2. ASPECTS is 10.

These results were called by telephone at the time of interpretation
on [DATE] at [DATE] to provider [HOSPITAL] NILSON , who
verbally acknowledged these results.

## 2020-11-20 MED ORDER — ASPIRIN 81 MG PO CHEW
324.0000 mg | CHEWABLE_TABLET | Freq: Once | ORAL | Status: AC
  Administered 2020-11-20: 324 mg via ORAL
  Filled 2020-11-20: qty 4

## 2020-11-20 MED ORDER — SODIUM CHLORIDE 0.9% FLUSH
3.0000 mL | Freq: Once | INTRAVENOUS | Status: AC
  Administered 2020-11-20: 3 mL via INTRAVENOUS

## 2020-11-20 NOTE — ED Notes (Signed)
Pt to CT at this time.

## 2020-11-20 NOTE — ED Triage Notes (Addendum)
Pt c/o R facial numbness and R hand tingling x45 mins. Speech clear. RLE drift present. Grip strength equal. No facial droop.

## 2020-11-20 NOTE — ED Notes (Signed)
Pt completing NIH with tele neuro at this tme

## 2020-11-20 NOTE — ED Notes (Signed)
Per neurologist, no TPA at this time d/t pt being able to ambulate steadily.

## 2020-11-20 NOTE — ED Notes (Signed)
Tele neuro set up at bedside - speaking with Tresa Endo, RN through teleneuro

## 2020-11-20 NOTE — ED Notes (Signed)
Pt returned from CT °

## 2020-11-20 NOTE — ED Provider Notes (Signed)
[facility] Emergency Department Provider Note  ____________________________________________  Time seen: Approximately 11:22 PM  I have reviewed the triage vital signs and the nursing notes.   HISTORY  Chief Complaint Code Stroke   HPI Morgan Stevens is a 55 y.o. female with a history of hypertension, hypothyroidism, and obesity who presents for evaluation of right facial numbness.  Patient reports tingling sensation of the right hand and the right lower face that started 45 minutes prior to arrival.  She denies weakness, slurred speech, facial droop, changes in vision, difficulty walking, headache.  No prior history of stroke.  She is not a smoker.  No family history of stroke.  No history of A. fib.  She is not on blood thinners.   Past Medical History:  Diagnosis Date  . Gastritis   . Hiatal hernia   . Obesity (BMI 30-39.9)   . Reflux   . Thyroid disease     Patient Active Problem List   Diagnosis Date Noted  . Stricture and stenosis of esophagus   . Columnar epithelial-lined lower esophagus   . Stomach irritation   . Gastric polyp   . Esophageal dysphagia   . Encounter for screening colonoscopy   . Diverticulosis of large intestine without diverticulitis   . Conversion disorder 06/11/2018  . Esophagitis, [place] grade A 09/06/2017  . Hiatal hernia 09/06/2017  . Schatzki's ring of distal esophagus 09/06/2017  . Other constipation 07/05/2017  . Sciatica 03/27/2017  . Pain in the chest   . Abnormal cardiac function test 02/26/2016  . Obesity (BMI 30-39.9)   . Angina pectoris (HCC) 02/25/2016  . Dyspnea 02/25/2016  . Hypothyroidism 02/25/2016  . 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 Chest pain 09/07/2015  . GERD (gastroesophageal reflux disease) 01/21/2014  . Gestational diabetes 01/21/2014  . Tonsillar hypertrophy 01/21/2014  . 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  CARDIAC CATHETERIZATION  02/27/2016   Procedure: Left Heart Cath and Coronary Angiography;  Surgeon: Corky CraftsJayadeep S Varanasi, MD;  [facility];  Service: Cardiovascular;;  . CESAREAN SECTION    . CHOLECYSTECTOMY    . COLONOSCOPY WITH PROPOFOL N/A 04/15/2019   Procedure: COLONOSCOPY WITH PROPOFOL;  Surgeon: Pasty Spillersahiliani, Varnita B, MD;  Location: [facility] ENDOSCOPY;  Service: Endoscopy;  Laterality: N/A;  . ESOPHAGOGASTRODUODENOSCOPY (EGD) WITH PROPOFOL N/A 04/15/2019   Procedure: ESOPHAGOGASTRODUODENOSCOPY (EGD) WITH PROPOFOL;  Surgeon: Pasty Spillersahiliani, Varnita B, MD;  Location: [facility] ENDOSCOPY;  Service: Endoscopy;  Laterality: N/A;  . THYROIDECTOMY    . THYROIDECTOMY      Prior to Admission medications   Medication Sig Start Date End Date Taking? Authorizing Provider  chlorpheniramine-HYDROcodone (TUSSIONEX PENNKINETIC ER) 10-8 MG/5ML SUER Take 5 mLs by mouth 2 (two) times daily. 08/04/20   Joni ReiningSmith, Ronald K, PA-C  cyclobenzaprine (FLEXERIL) 5 MG tablet Take 1 tablet (5 mg total) by mouth 3 (three) times daily as needed. Patient taking differently: Take 5 mg by mouth 3 (three) times daily as needed for muscle spasms.  11/15/19   Menshew, Charlesetta IvoryJenise V Bacon, PA-C  fluticasone (FLONASE) 50 MCG/ACT nasal spray Place 2 sprays into both nostrils daily. 09/14/19 09/13/20  [provider]  levothyroxine (SYNTHROID, LEVOTHROID) 137 MCG tablet Take 137 mcg by mouth daily before breakfast.    [provider]  methylPREDNISolone (MEDROL DOSEPAK) 4 MG TBPK tablet Take Tapered dose as directed 08/04/20   Joni ReiningSmith, Ronald K, PA-C  omeprazole (PRILOSEC) 20 MG capsule Take 20  mg by mouth 2 (two) times daily before a meal.    [provider]    Allergies Patient has no known allergies.  Family History  Problem Relation Age of Onset  . Breast cancer Neg Hx     Social History Social History   Tobacco Use  . Smoking status: Never Smoker  . Smokeless tobacco: Never Used  Vaping Use  .  Vaping Use: Never used  Substance Use Topics  . Alcohol use: No    Alcohol/week: 0.0 standard drinks  . Drug use: No    Review of Systems  Constitutional: Negative for fever. Eyes: Negative for visual changes. ENT: Negative for sore throat. Neck: No neck pain  Cardiovascular: Negative for chest pain. Respiratory: Negative for shortness of breath. Gastrointestinal: Negative for abdominal pain, vomiting or diarrhea. Genitourinary: Negative for dysuria. Musculoskeletal: Negative for back pain. Skin: Negative for rash. Neurological: Negative for headaches. + tingling/ numbness of the R face and R hand Psych: No SI or HI  ____________________________________________   PHYSICAL EXAM:  VITAL SIGNS: ED Triage Vitals [11/20/20 2254]  Enc Vitals Group     BP (!) 155/110     Pulse Rate 85     Resp 18     Temp 98.1 F (36.7 C)     Temp Source Oral     SpO2 99 %     Weight      Height      Head Circumference      Peak Flow      Pain Score      Pain Loc      Pain Edu?      Excl. in GC?     Constitutional: Alert and oriented. Well appearing and in no apparent distress. HEENT:      Head: Normocephalic and atraumatic.         Eyes: Conjunctivae are normal. Sclera is non-icteric.       Mouth/Throat: Mucous membranes are moist.       Neck: Supple with no signs of meningismus. Cardiovascular: Regular rate and rhythm. No murmurs, gallops, or rubs. 2+ symmetrical distal pulses are present in all extremities. No JVD. Respiratory: Normal respiratory effort. Lungs are clear to auscultation bilaterally.  Gastrointestinal: Soft, non tender, and non distended with positive bowel sounds. No rebound or guarding. Genitourinary: No CVA tenderness. Musculoskeletal:  No edema, cyanosis, or erythema of extremities. Neurologic: Normal speech and language. Face is symmetric. EOMI, PERRL. No pronator drift or dysmetria, slightly decreased sensation to touch to the R face, normal strength x 4   Skin: Skin is warm, dry and intact. No rash noted. Psychiatric: Mood and affect are normal. Speech and behavior are normal.   NIH Stroke Scale  Interval: Baseline Time: 11:28 PM Person Administering Scale: [place]  Administer stroke scale items in the order listed. Record performance in each category after each subscale exam. Do not go back and change scores. Follow directions provided for each exam technique. Scores should reflect what the patient does, not what the clinician thinks the patient can do. The clinician should record answers while administering the exam and work quickly. Except where indicated, the patient should not be coached (i.e., repeated requests to patient to make a special effort).   1a  Level of consciousness: 0=alert; keenly responsive  1b. LOC questions:  0=Performs both tasks correctly  1c. LOC commands: 0=Performs both tasks correctly  2.  Best Gaze: 0=normal  3.  Visual: 0=No visual loss  4. Facial  Palsy: 0=Normal symmetric movement  5a.  Motor left arm: 0=No drift, limb holds 90 (or 45) degrees for full 10 seconds  5b.  Motor right arm: 0=No drift, limb holds 90 (or 45) degrees for full 10 seconds  6a. motor left leg: 0=No drift, limb holds 90 (or 45) degrees for full 10 seconds  6b  Motor right leg:  1=Drift, limb holds 90 (or 45) degrees but drifts down before full 10 seconds: does not hit bed  7. Limb Ataxia: 0=Absent  8.  Sensory: 1=Mild to moderate sensory loss; patient feels pinprick is less sharp or is dull on the affected side; there is a loss of superficial pain with pinprick but patient is aware She is being touched  9. Best Language:  0=No aphasia, normal  10. Dysarthria: 0=Normal  11. Extinction and Inattention: 0=No abnormality   Total:   2     ____________________________________________   LABS (all labs ordered are listed, but only abnormal results are displayed)  Labs Reviewed  CBG MONITORING, ED - Abnormal; Notable for  the following components:      Result Value   Glucose-Capillary 115 (*)    All other components within normal limits  RESP PANEL BY RT-PCR (FLU A&B, COVID) ARPGX2  PROTIME-INR  APTT  CBC  DIFFERENTIAL  COMPREHENSIVE METABOLIC PANEL  CBG MONITORING, ED  I-STAT CREATININE, ED  POC URINE PREG, ED   ____________________________________________  EKG  ED ECG REPORT I, Nita Sickle, the attending physician, personally viewed and interpreted this ECG.  Normal sinus rhythm, rate of 83, normal intervals, normal axis, no ST elevations or depressions.  Normal EKG ____________________________________________  RADIOLOGY  I have personally reviewed the images performed during this visit and I agree with the Radiologist's read.   Interpretation by Radiologist:  CT HEAD CODE STROKE WO CONTRAST  Result Date: 11/20/2020 CLINICAL DATA:  Code stroke.  Right facial numbness EXAM: CT HEAD WITHOUT CONTRAST TECHNIQUE: Contiguous axial images were obtained from the base of the skull through the vertex without intravenous contrast. COMPARISON:  03/24/2018 FINDINGS: Brain: There is no mass, hemorrhage or extra-axial collection. The size and configuration of the ventricles and extra-axial CSF spaces are normal. The brain parenchyma is normal, without evidence of acute or chronic infarction. Vascular: No abnormal hyperdensity of the major intracranial arteries or dural venous sinuses. No intracranial atherosclerosis. Skull: The visualized skull base, calvarium and extracranial soft tissues are normal. Sinuses/Orbits: No fluid levels or advanced mucosal thickening of the visualized paranasal sinuses. No mastoid or middle ear effusion. The orbits are normal. ASPECTS [facility] Stroke Program Early CT Score) - Ganglionic level infarction (caudate, lentiform nuclei, internal capsule, insula, M1-M3 cortex): 7 - Supraganglionic infarction (M4-M6 cortex): 3 Total score (0-10 with 10 being normal): 10 IMPRESSION: 1.  Normal head CT. 2. ASPECTS is 10. These results were called by telephone at the time of interpretation on 11/20/2020 at 11:16 pm to provider [facility] , who verbally acknowledged these results. Electronically Signed   By: Deatra Robinson M.D.   On: 11/20/2020 23:22     ____________________________________________   PROCEDURES  Procedure(s) performed:yes .1-3 Lead EKG Interpretation Performed by: Nita Sickle, MD Authorized by: Nita Sickle, MD     Interpretation: normal     ECG rate assessment: normal     Rhythm: sinus rhythm     Ectopy: none     Conduction: normal     Critical Care performed:  None ____________________________________________   INITIAL IMPRESSION / ASSESSMENT AND PLAN / ED  COURSE   55 y.o. female with a history of hypertension, hypothyroidism, and obesity who presents for evaluation of right facial numbness and R hand numbness. Last seen normal 10PM. NIHSS 2. CODE STROKE activated. Head CT negative. Patient evaluted by teleneurology who recommended admission for stroke work up. No tPA recommended. ASA given. Will discuss with hospitalist for admission.         _____________________________________________ Please note:  Patient was evaluated in Emergency Department today for the symptoms described in the history of present illness. Patient was evaluated in the context of the global COVID-19 pandemic, which necessitated consideration that the patient might be at risk for infection with the SARS-CoV-2 virus that causes COVID-19. Institutional protocols and algorithms that pertain to the evaluation of patients at risk for COVID-19 are in a state of rapid change based on information released by regulatory bodies including the CDC and federal and state organizations. These policies and algorithms were followed during the patient's care in the ED.  Some ED evaluations and interventions may be delayed as a result of limited staffing during the  pandemic.   [place] Controlled Substance Database was reviewed by me. ____________________________________________   FINAL CLINICAL IMPRESSION(S) / ED DIAGNOSES   Final diagnoses:  Cerebrovascular accident (CVA), unspecified mechanism (HCC)      NEW MEDICATIONS STARTED DURING THIS VISIT:  ED Discharge Orders    None       Note:  This document was prepared using Dragon voice recognition software and may include unintentional dictation errors.    Don Perking, [place], MD 11/20/20 2329

## 2020-11-20 NOTE — ED Notes (Signed)
Charge RN [employer] notified of code stroke.

## 2020-11-20 NOTE — Consult Note (Signed)
[facility] Tele[facility]urology Consult Services   Date of Service:   11/20/2020 23:09:56  Diagnosis:     .  R29.810 - Facial numbness/ Facial weakness  Impression:     . 55 year old female with a past medical history of HTN presents with right facial, lip and tongue numbness that started approx 45 minutes prior to arrival to the ED. NIHHS 2, Recommend admission for stroke work up.  Metrics: Last Known Well: 11/20/2020 22:09:00 [facility] Notification Time: 11/20/2020 23:09:56 Arrival Time: 11/20/2020 22:50:00 Stamp Time: 11/20/2020 23:09:56 Initial Response Time: 11/20/2020 23:12:17 Symptoms: Right facial numbness. Marland Kitchen NIHSS Start Assessment Time: 11/20/2020 23:25:26 Patient is not a candidate for Thrombolytic. Thrombolytic Medical Decision: 11/20/2020 23:25:50 Patient was not deemed candidate for Thrombolytic because of following reasons: No disabling symptoms.  CT head showed no acute hemorrhage or acute core infarct.  ED Physician notified of diagnostic impression and management plan on 11/20/2020 23:26:17  Advanced Imaging: Advanced Imaging Not Recommended because:  Clinical presentation is not suggestion of LVO or Low clinical suspicion of LVO based on presentation   Our recommendations are outlined below.  Recommendations:      .  Stroke/Telemetry Floor     .  Neuro Checks     .  Bedside Swallow Eval     .  DVT Prophylaxis     .  IV Fluids, Normal Saline     .  Head of Bed 30 Degrees     .  Euglycemia and Avoid Hyperthermia (PRN Acetaminophen)     .  Initiate Aspirin 81 MG Daily     .  Antihypertensives PRN if Blood pressure is greater than 220/120 or there is a concern for End organ damage/contraindications for permissive HTN. If blood pressure is greater than 220/120 give labetalol PO or IV or Vasotec IV with a goal of 15% reduction in BP during the first 24 hours.    Sign Out:     .  Discussed with Emergency Department  Provider    ------------------------------------------------------------------------------  History of Present Illness: Patient is a 55 year old Female.  Patient was brought by private transportation with symptoms of Right facial numbness. .  55 year old female with a past medical history of HTN presents with right facial, lip and tongue numbness that started approx 45 minutes prior to arrival to the ED. Patient was watching TV when the symptoms started. NIHHS is 2 for right facial numbness and slight right lower drift, non disabling, patient is able to ambulate without any assistance.    Past Medical History:     . Hypertension     . There is NO history of Diabetes Mellitus     . There is NO history of Hyperlipidemia     . There is NO history of Coronary Artery Disease     . There is NO history of Stroke  Anticoagulant use:  No  Antiplatelet use: No  Allergies:  Reviewed     Examination: BP(155/110), Pulse(85), Blood Glucose(115) 1A: Level of Consciousness - Alert; keenly responsive + 0 1B: Ask Month and Age - Both Questions Right + 0 1C: Blink Eyes & Squeeze Hands - Performs Both Tasks + 0 2: Test Horizontal Extraocular Movements - Normal + 0 3: Test Visual Fields - No Visual Loss + 0 4: Test Facial Palsy (Use Grimace if Obtunded) - Normal symmetry + 0 5A: Test Left Arm Motor Drift - No Drift for 10 Seconds + 0 5B: Test Right Arm Motor Drift -  No Drift for 10 Seconds + 0 6A: Test Left Leg Motor Drift - No Drift for 5 Seconds + 0 6B: Test Right Leg Motor Drift - Drift, but doesn't hit bed + 1 7: Test Limb Ataxia (FNF/Heel-Shin) - No Ataxia + 0 8: Test Sensation - Mild-Moderate Loss: Less Sharp/More Dull + 1 9: Test Language/Aphasia - Normal; No aphasia + 0 10: Test Dysarthria - Normal + 0 11: Test Extinction/Inattention - No abnormality + 0  NIHSS Score: 2  Pre-Morbid Modified Rankin Scale: 0 Points = No symptoms at all   Patient/Family was informed the Neurology  Consult would occur via TeleHealth consult by way of interactive audio and video telecommunications and consented to receiving care in this manner.   Patient is being evaluated for possible acute neurologic impairment and high probability of imminent or life-threatening deterioration. I spent total of 30 minutes providing care to this patient, including time for face to face visit via telemedicine, review of medical records, imaging studies and discussion of findings with providers, the patient and/or family.   Dr Lucious Groves   [facility] [phone]  Case [id number]

## 2020-11-21 ENCOUNTER — Observation Stay: Payer: Medicaid Other

## 2020-11-21 ENCOUNTER — Encounter: Payer: Self-pay | Admitting: Family Medicine

## 2020-11-21 DIAGNOSIS — M4802 Spinal stenosis, cervical region: Secondary | ICD-10-CM | POA: Diagnosis not present

## 2020-11-21 DIAGNOSIS — M50222 Other cervical disc displacement at C5-C6 level: Secondary | ICD-10-CM | POA: Diagnosis not present

## 2020-11-21 DIAGNOSIS — M50221 Other cervical disc displacement at C4-C5 level: Secondary | ICD-10-CM | POA: Diagnosis not present

## 2020-11-21 DIAGNOSIS — G459 Transient cerebral ischemic attack, unspecified: Secondary | ICD-10-CM | POA: Diagnosis present

## 2020-11-21 DIAGNOSIS — R200 Anesthesia of skin: Secondary | ICD-10-CM | POA: Diagnosis not present

## 2020-11-21 DIAGNOSIS — M5021 Other cervical disc displacement,  high cervical region: Secondary | ICD-10-CM | POA: Diagnosis not present

## 2020-11-21 DIAGNOSIS — G458 Other transient cerebral ischemic attacks and related syndromes: Secondary | ICD-10-CM | POA: Diagnosis not present

## 2020-11-21 DIAGNOSIS — R531 Weakness: Secondary | ICD-10-CM | POA: Diagnosis not present

## 2020-11-21 LAB — HEMOGLOBIN A1C
Hgb A1c MFr Bld: 5.3 % (ref 4.8–5.6)
Mean Plasma Glucose: 105.41 mg/dL

## 2020-11-21 LAB — COMPREHENSIVE METABOLIC PANEL
ALT: 39 U/L (ref 0–44)
AST: 25 U/L (ref 15–41)
Albumin: 4.3 g/dL (ref 3.5–5.0)
Alkaline Phosphatase: 36 U/L — ABNORMAL LOW (ref 38–126)
Anion gap: 9 (ref 5–15)
BUN: 11 mg/dL (ref 6–20)
CO2: 28 mmol/L (ref 22–32)
Calcium: 9.2 mg/dL (ref 8.9–10.3)
Chloride: 104 mmol/L (ref 98–111)
Creatinine, Ser: 0.78 mg/dL (ref 0.44–1.00)
GFR, Estimated: >60 mL/min (ref >60)
Glucose, Bld: 113 mg/dL — ABNORMAL HIGH (ref 70–99)
Potassium: 3.9 mmol/L (ref 3.5–5.1)
Sodium: 141 mmol/L (ref 135–145)
Total Bilirubin: 0.8 mg/dL (ref 0.3–1.2)
Total Protein: 7.7 g/dL (ref 6.5–8.1)

## 2020-11-21 LAB — APTT: aPTT: 30 seconds (ref 24–36)

## 2020-11-21 LAB — RESP PANEL BY RT-PCR (FLU A&B, COVID) ARPGX2
Influenza A by PCR: NEGATIVE
Influenza B by PCR: NEGATIVE
SARS Coronavirus 2 by RT PCR: NEGATIVE

## 2020-11-21 LAB — LIPID PANEL
Cholesterol: 187 mg/dL (ref 0–200)
HDL: 44 mg/dL (ref >40)
LDL Cholesterol: 120 mg/dL — ABNORMAL HIGH (ref 0–99)
Total CHOL/HDL Ratio: 4.3 RATIO
Triglycerides: 117 mg/dL (ref <150)
VLDL: 23 mg/dL (ref 0–40)

## 2020-11-21 LAB — PROTIME-INR
INR: 1 (ref 0.8–1.2)
Prothrombin Time: 12.5 seconds (ref 11.4–15.2)

## 2020-11-21 LAB — POC URINE PREG, ED: Preg Test, Ur: NEGATIVE

## 2020-11-21 LAB — HIV ANTIBODY (ROUTINE TESTING W REFLEX): HIV Screen 4th Generation wRfx: NONREACTIVE

## 2020-11-21 IMAGING — MR MR HEAD W/O CM
11 series · 41 of 48 positions shown · non-contrast
Comparison: None.

CLINICAL DATA: Right facial numbness

EXAM:
MRI HEAD WITHOUT CONTRAST
TECHNIQUE: Multiplanar, multiecho pulse sequences of the brain and surrounding
structures were obtained without intravenous contrast.

[Series 5: ax dwi_tracew · axial · 3.0mm · 0.65mm/px · z∈[-63,+84]mm · 5 of 46 slices shown]
[im 1/46]
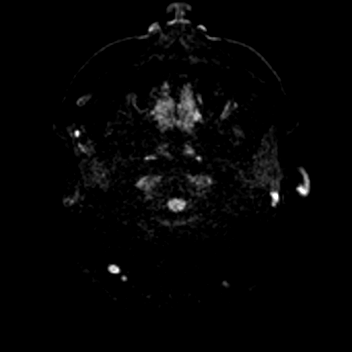
[im 12/46]
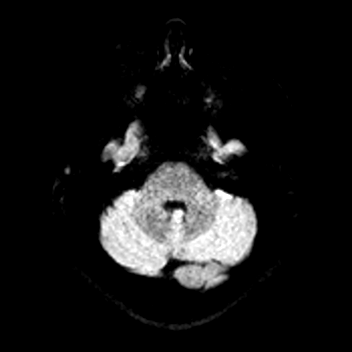
[im 23/46]
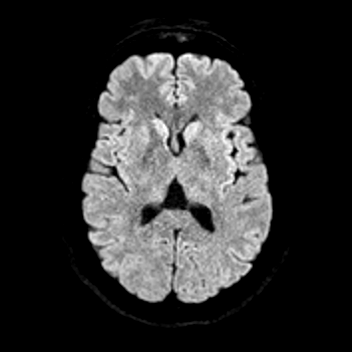
[im 34/46]
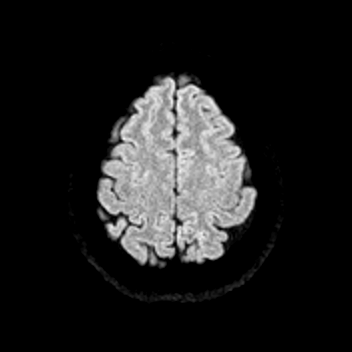
[im 46/46]
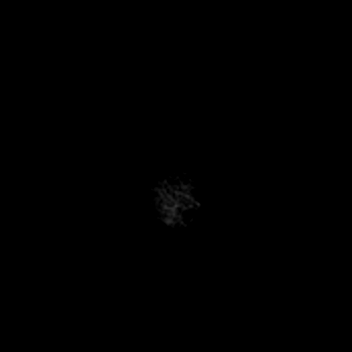

[Series 6: ax dwi_adc · axial · 3.0mm · 0.65mm/px · z∈[-63,+84]mm · 4 of 46 slices shown]
[im 1/46]
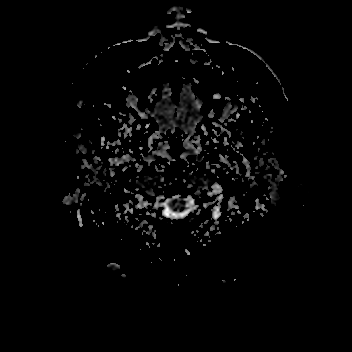
[im 16/46]
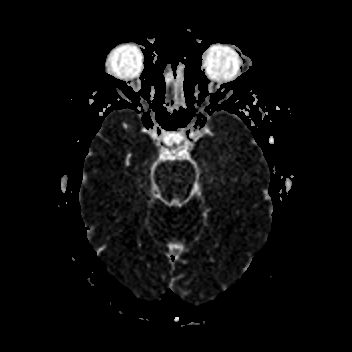
[im 31/46]
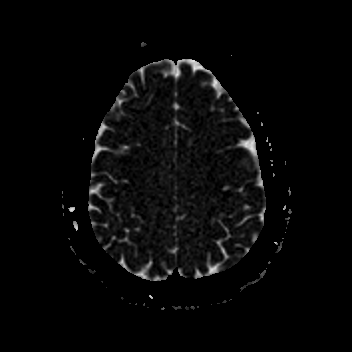
[im 46/46]
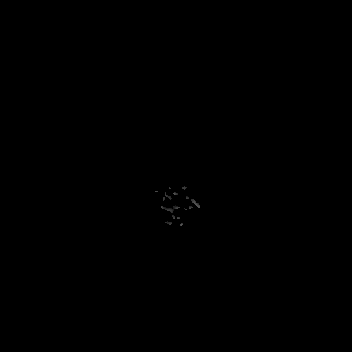

[Series 7: cor dwi_tracew · coronal · 5.0mm · 0.60mm/px · 3 of 38 slices shown]
[im 1/38]
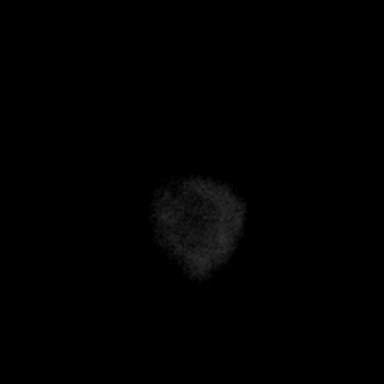
[im 19/38]
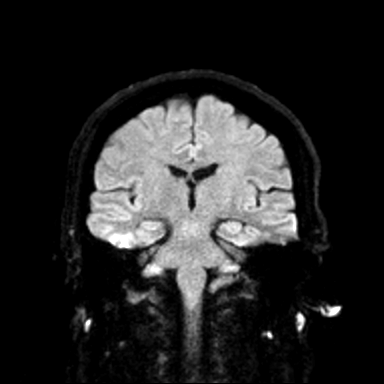
[im 38/38]
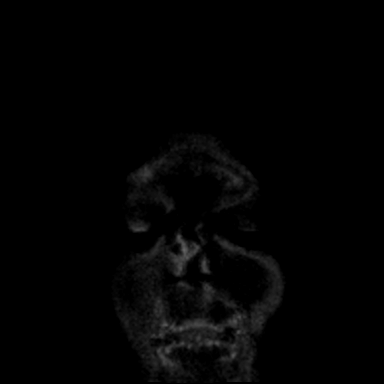

[Series 8: cor dwi_adc · coronal · 5.0mm · 0.60mm/px · 3 of 38 slices shown]
[im 1/38]
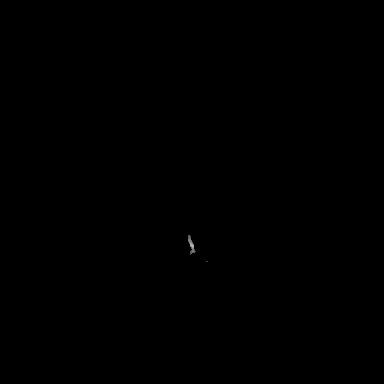
[im 19/38]
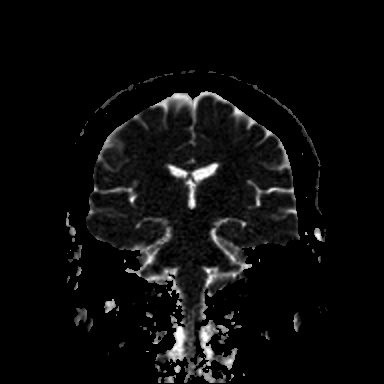
[im 38/38]
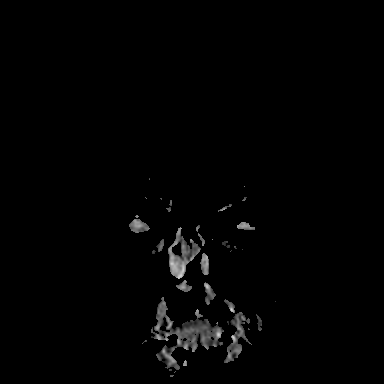

[Series 9: T1 · sagittal · 5.0mm · 0.62mm/px · 2 of 21 slices shown (1 of 2)]
[im 1/21]
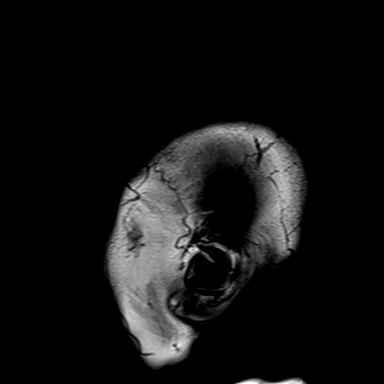
[im 21/21]
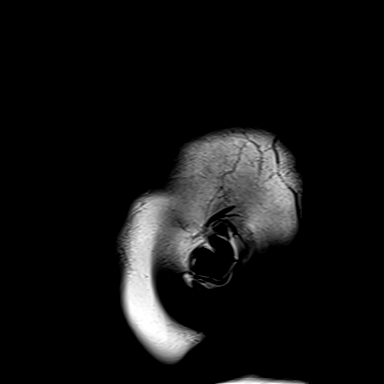

[Series 10: T2 · axial · 5.0mm · 0.45mm/px · z∈[-63,+80]mm · 2 of 25 slices shown (1 of 2)]
[im 1/25]
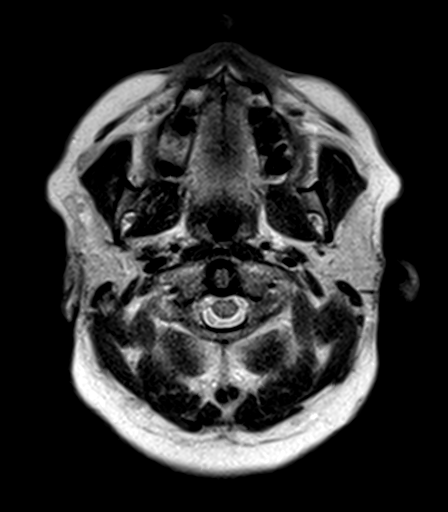
[im 25/25]
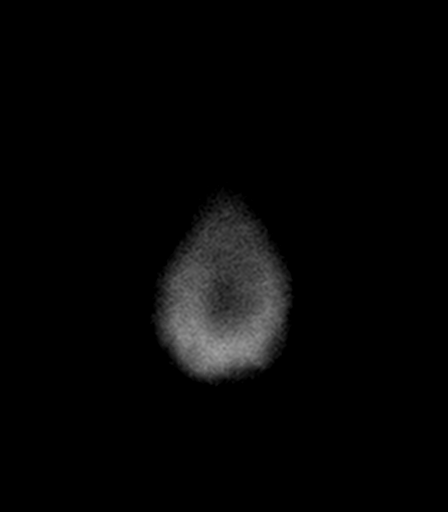

[Series 12: pha_images · axial · 3.0mm · 0.90mm/px · z∈[-65,+86]mm · 4 of 52 slices shown]
[im 1/52]
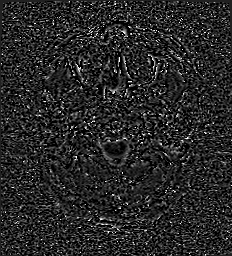
[im 18/52]
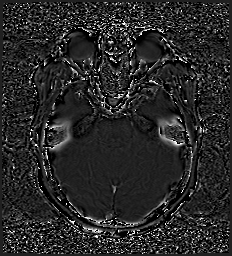
[im 35/52]
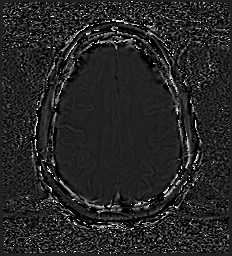
[im 52/52]
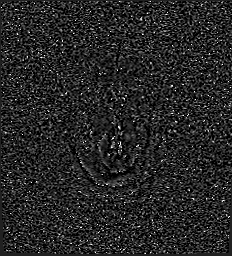

[Series 13: swi_images · axial · 3.0mm · 0.90mm/px · z∈[-65,+36]mm · 3 of 52 slices shown]
[im 1/52]
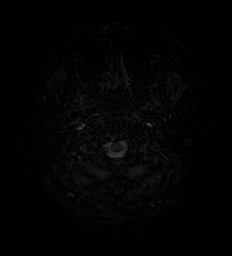
[im 18/52]
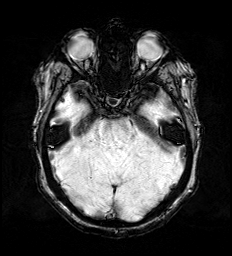
[im 35/52]
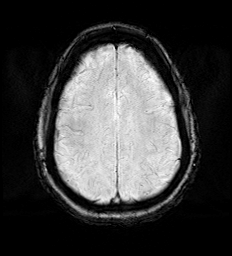

[Series 15: FLAIR · axial · 3.0mm · 0.53mm/px · z∈[-65,+80]mm · 4 of 50 slices shown]
[im 1/50]
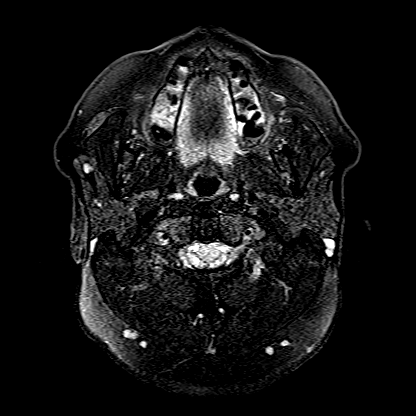
[im 17/50]
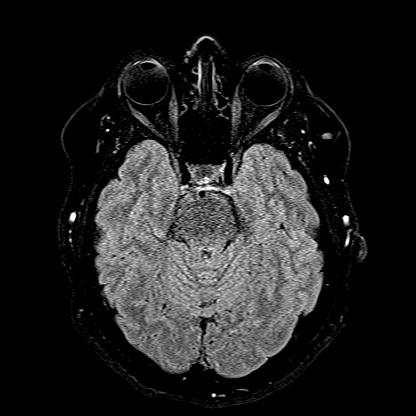
[im 33/50]
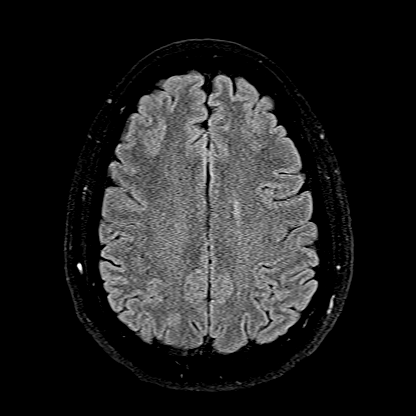
[im 50/50]
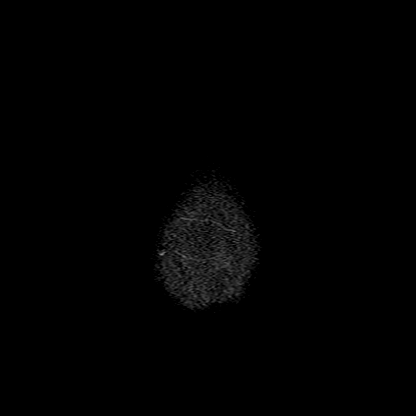

[Series 16: T1 · axial · 1.0mm · 0.98mm/px · z∈[-69,+88]mm · 8 of 160 slices shown (2 of 2)]
[im 1/160]
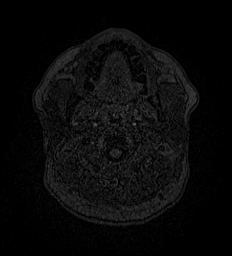
[im 25/160]
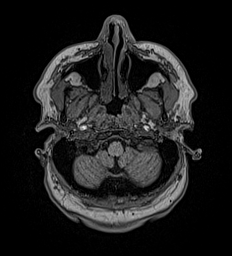
[im 49/160]
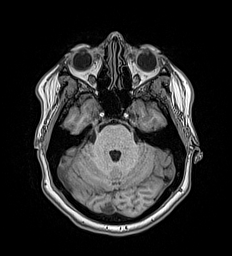
[im 74/160]
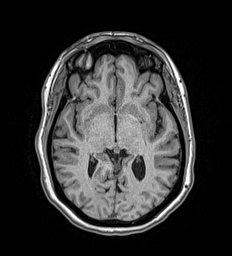
[im 86/160]
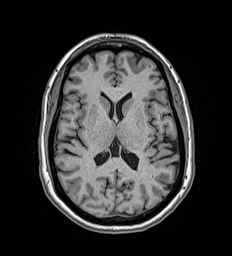
[im 111/160]
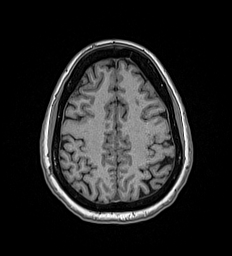
[im 135/160]
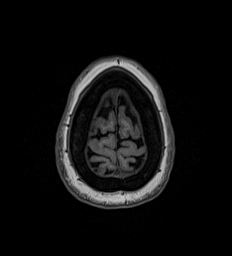
[im 160/160]
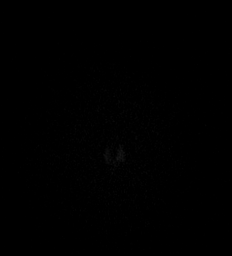

[Series 17: T2 · coronal · 5.0mm · 0.45mm/px · 3 of 31 slices shown (2 of 2)]
[im 1/31]
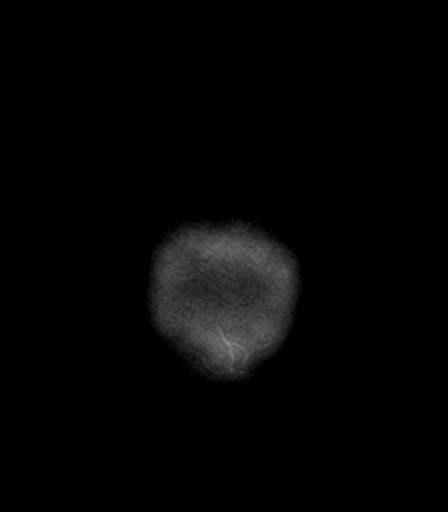
[im 16/31]
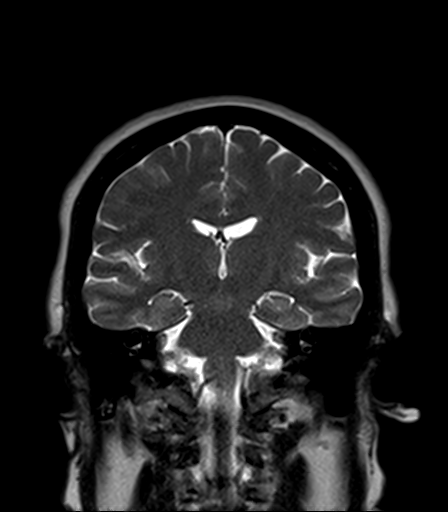
[im 31/31]
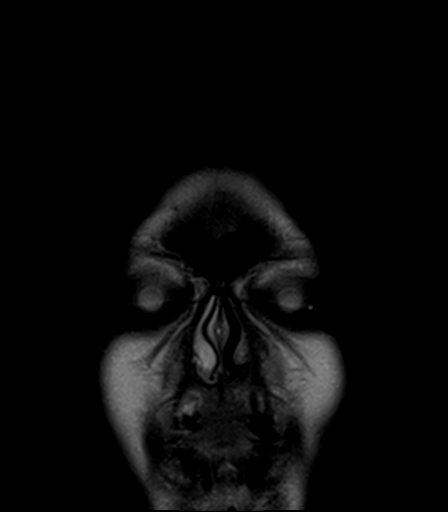

[41 of 48 positions shown; findings below may reference images not displayed]

FINDINGS: Brain: No acute infarct, mass effect or extra-axial collection. No
acute or chronic hemorrhage. Normal white matter signal, parenchymal
volume and CSF spaces. The midline structures are normal.

Vascular: Major flow voids are preserved.

Skull and upper cervical spine: Normal calvarium and skull base.
Visualized upper cervical spine and soft tissues are normal.

Sinuses/Orbits:No paranasal sinus fluid levels or advanced mucosal
thickening. No mastoid or middle ear effusion. Normal orbits.
IMPRESSION: Normal brain MRI.

## 2020-11-21 IMAGING — MR MR CERVICAL SPINE W/O CM
5 series · 40 of 48 positions shown · non-contrast
Comparison: None.

CLINICAL DATA: Acute onset right upper and lower extremity and
right facial numbness.

EXAM:
MRI CERVICAL SPINE WITHOUT CONTRAST
TECHNIQUE: Multiplanar, multisequence MR imaging of the cervical spine was
performed. No intravenous contrast was administered.

[Series 5: T2 · sagittal · 3.0mm · 0.62mm/px · 6 of 15 slices shown (1 of 2)]
[im 1/15]
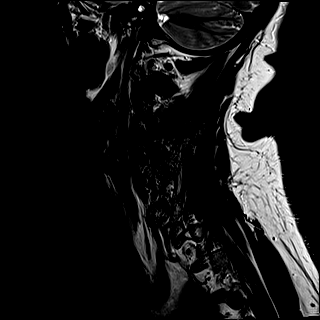
[im 3/15]
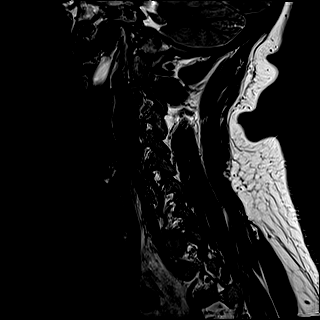
[im 6/15]
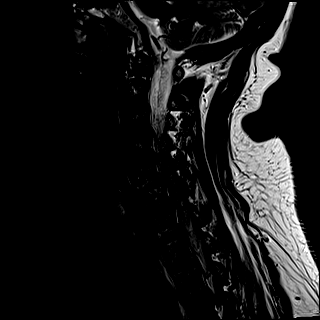
[im 9/15]
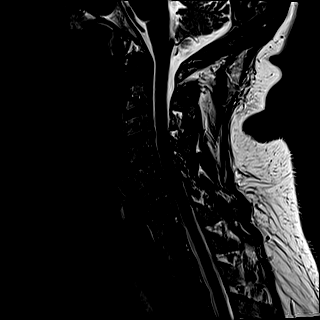
[im 12/15]
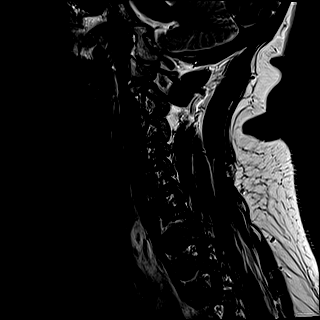
[im 15/15]
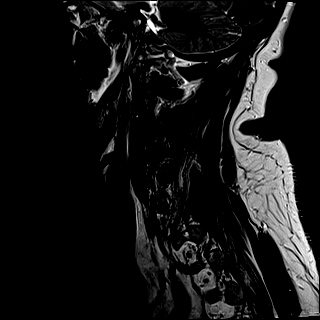

[Series 6: FLAIR · sagittal · 3.0mm · 0.78mm/px · 7 of 15 slices shown]
[im 1/15]
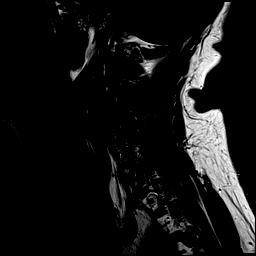
[im 3/15]
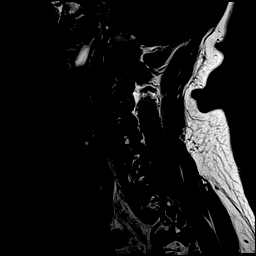
[im 5/15]
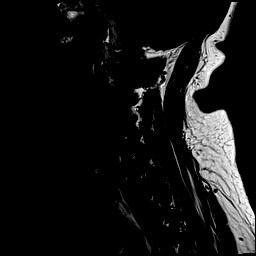
[im 8/15]
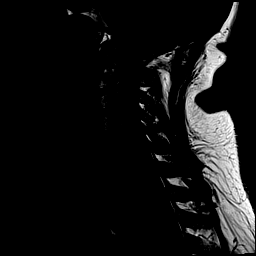
[im 10/15]
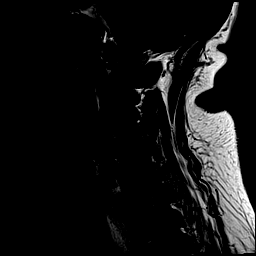
[im 12/15]
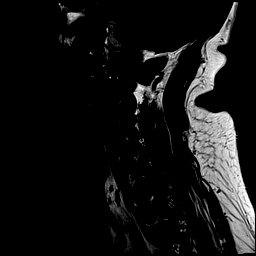
[im 15/15]
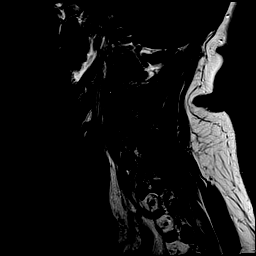

[Series 7: STIR · sagittal · 3.0mm · 0.62mm/px · 7 of 15 slices shown]
[im 1/15]
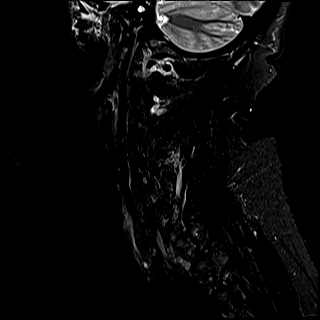
[im 3/15]
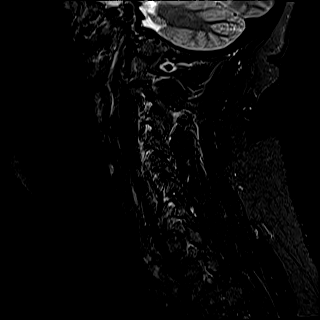
[im 5/15]
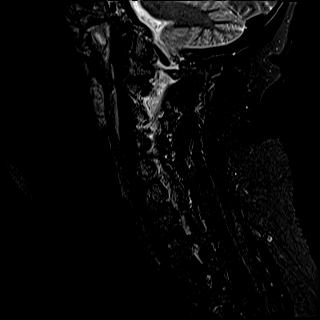
[im 8/15]
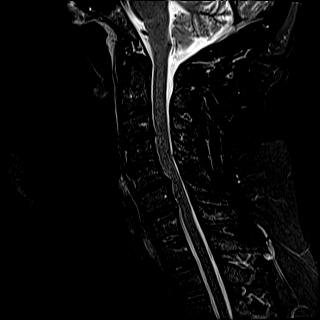
[im 10/15]
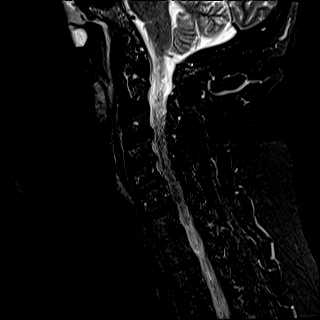
[im 12/15]
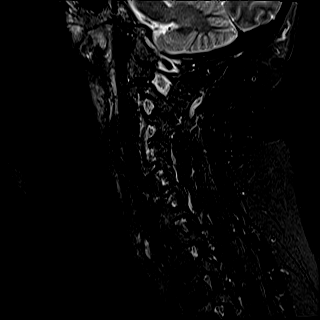
[im 15/15]
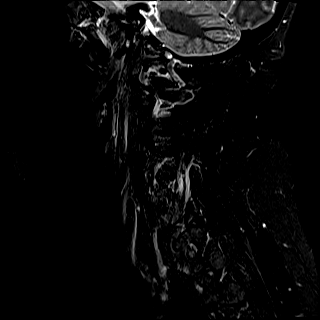

[Series 8: T2 · axial · 3.0mm · 0.70mm/px · z∈[-89,+9]mm · 12 of 29 slices shown (2 of 2)]
[im 1/29]
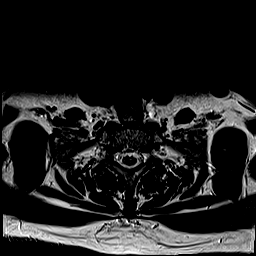
[im 3/29]
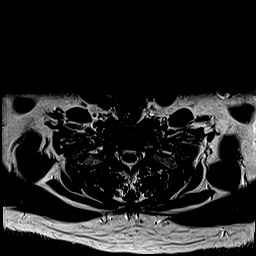
[im 5/29]
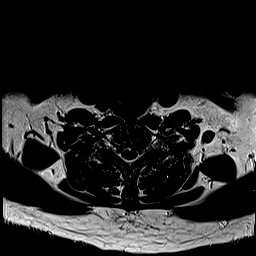
[im 7/29]
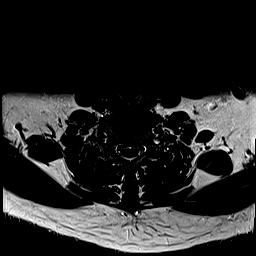
[im 9/29]
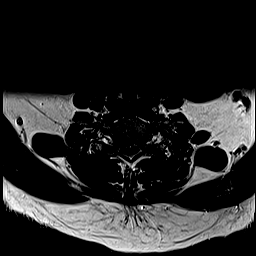
[im 11/29]
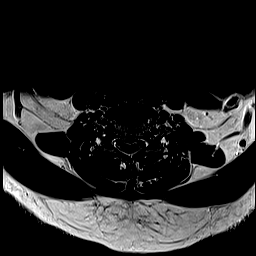
[im 13/29]
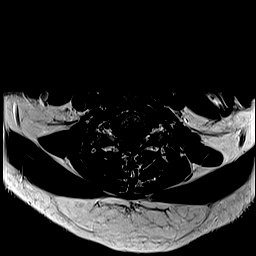
[im 16/29]
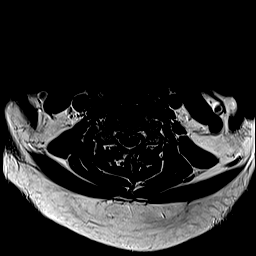
[im 18/29]
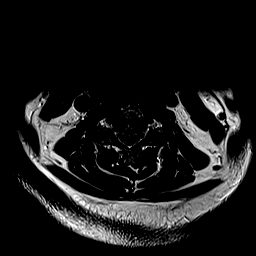
[im 20/29]
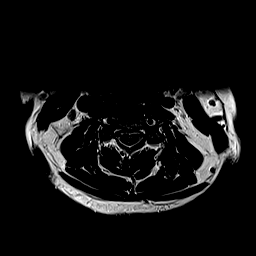
[im 24/29]
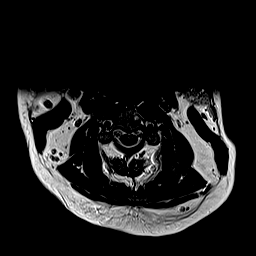
[im 29/29]
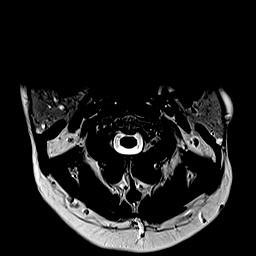

[Series 9: ax mpgr · axial · 3.0mm · 0.35mm/px · z∈[-89,+9]mm · 8 of 29 slices shown]
[im 1/29]
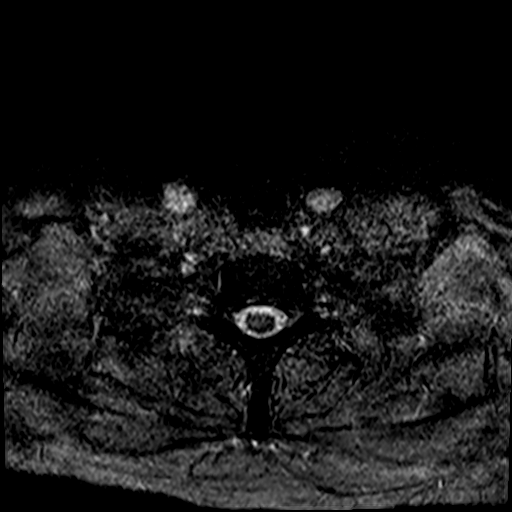
[im 5/29]
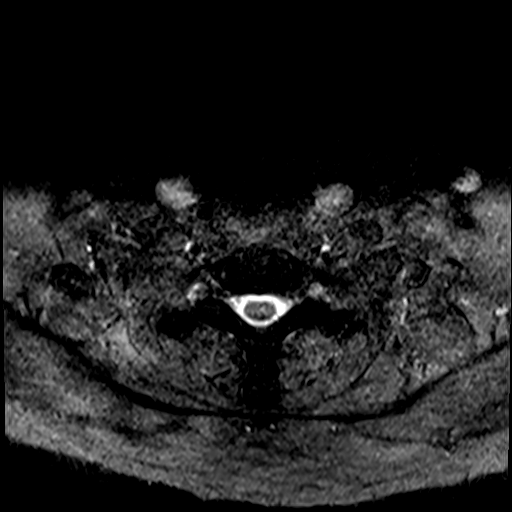
[im 9/29]
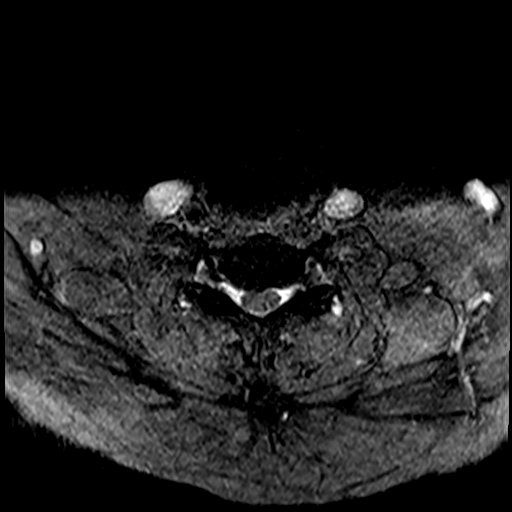
[im 13/29]
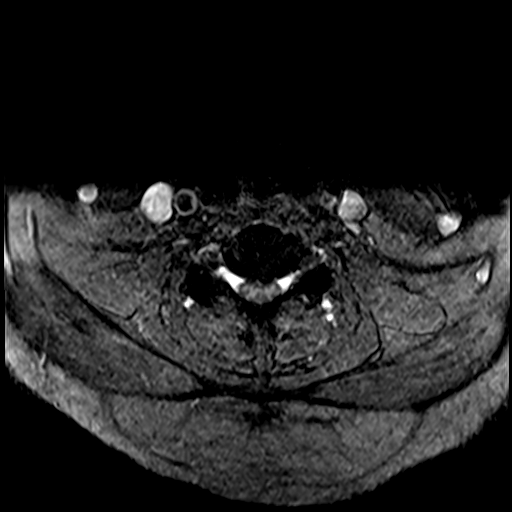
[im 16/29]
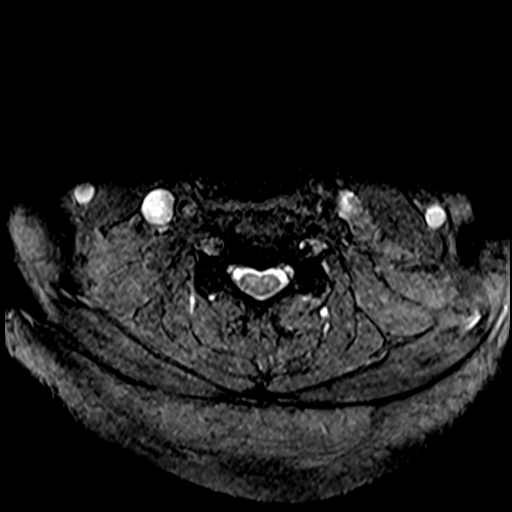
[im 20/29]
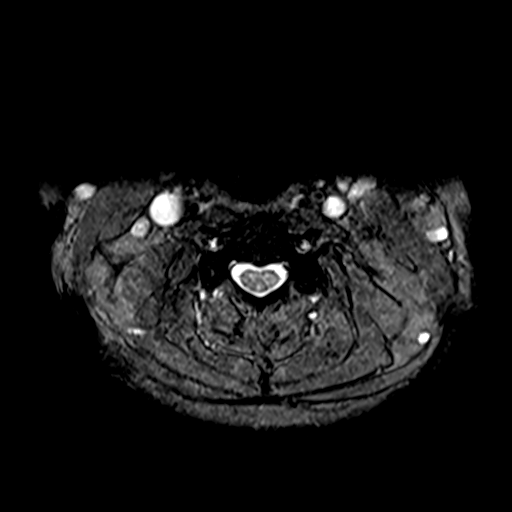
[im 24/29]
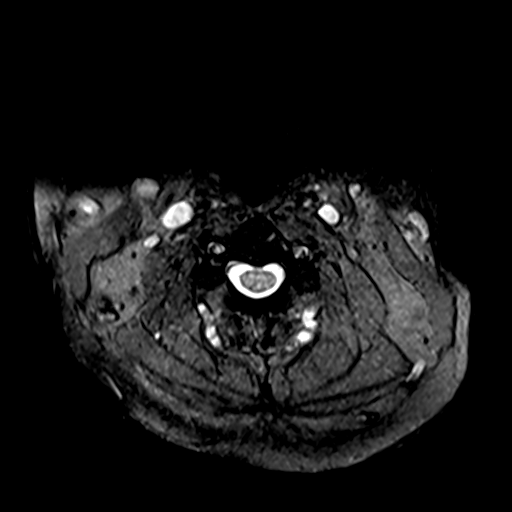
[im 29/29]
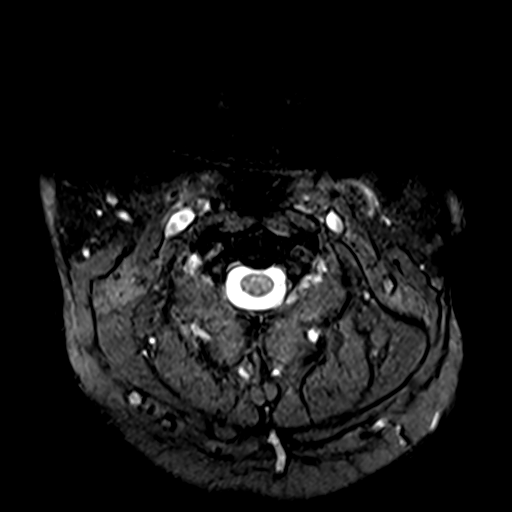

[40 of 48 positions shown; findings below may reference images not displayed]

FINDINGS: Alignment: Maintained.

Vertebrae: No fracture, evidence of discitis, or bone lesion.

Cord: Normal signal throughout.

Posterior Fossa, vertebral arteries, paraspinal tissues: Negative.

Disc levels:

C2-3: Negative.

C3-4: A small central disc protrusion just contacts the ventral
cord. The foramina are widely patent.

C4-5: Shallow broad-based disc bulge and uncovertebral spurring. The
ventral thecal sac is effaced. Mild bilateral foraminal narrowing is
more notable on the right.

C5-6: Central disc protrusion indents the ventral cord. The foramina
are open.

C6-7: A right paracentral disc protrusion deforms the right
hemicord. The foramina are open.

C7-T1: Negative.
IMPRESSION: Central disc protrusion at C5-6 indents the ventral cord.

Right paracentral protrusion at C6-7 deforms the right hemicord.

Small central protrusion at C3-4 contacts the cord without cord
deformity.

Shallow disc bulges C4-5 effaces the ventral thecal sac. Mild
bilateral foraminal narrowing at this level is worse on the right.

## 2020-11-21 IMAGING — CT CT ANGIO NECK
2 of 7 series · 8 of 33 positions shown · IV contrast (APPLIED)
Comparison: [DATE]

CLINICAL DATA: Code stroke follow-up for right facial numbness

EXAM:
CT ANGIOGRAPHY HEAD AND NECK
TECHNIQUE: Multidetector CT imaging of the head and neck was performed using
the standard protocol during bolus administration of intravenous
contrast. Multiplanar CT image reconstructions and MIPs were
obtained to evaluate the vascular anatomy. Carotid stenosis
measurements (when applicable) are obtained utilizing NASCET
criteria, using the distal internal carotid diameter as the
denominator.
CONTRAST:  75mL OMNIPAQUE IOHEXOL 350 MG/ML SOLN

[Series 511: cta head neck · axial · 0.47mm/px · z∈[+108,+226]mm · 2 of 179 slices shown]
[im 60/179  soft-tissue]
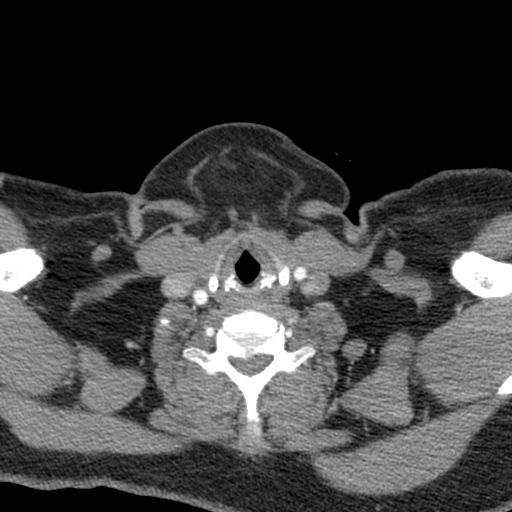
[im 119/179  soft-tissue]
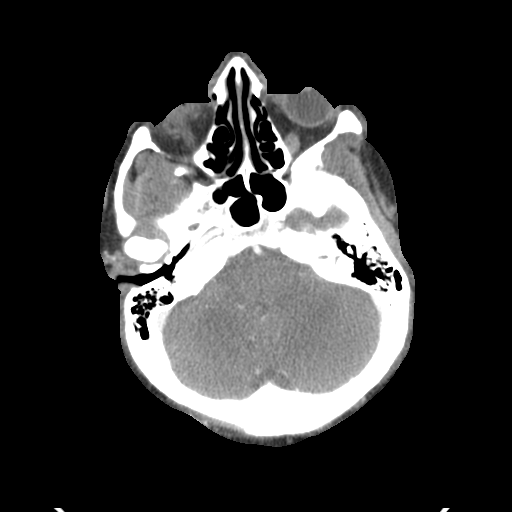

[Series 513: ax thin · axial · 0.48mm/px · z∈[+40,+294]mm · 6 of 356 slices shown]
[im 51/356  soft-tissue]
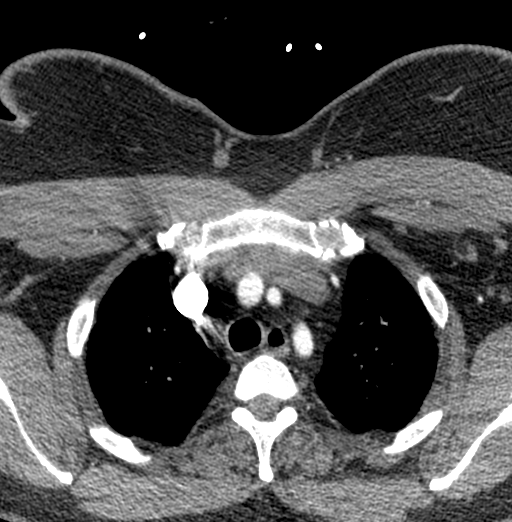
[im 102/356  bone]
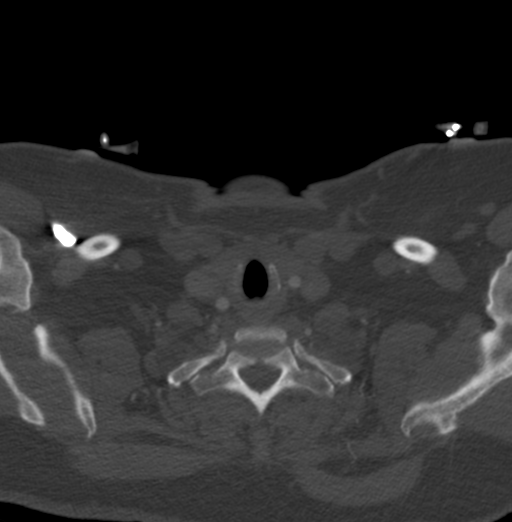
[im 153/356  soft-tissue]
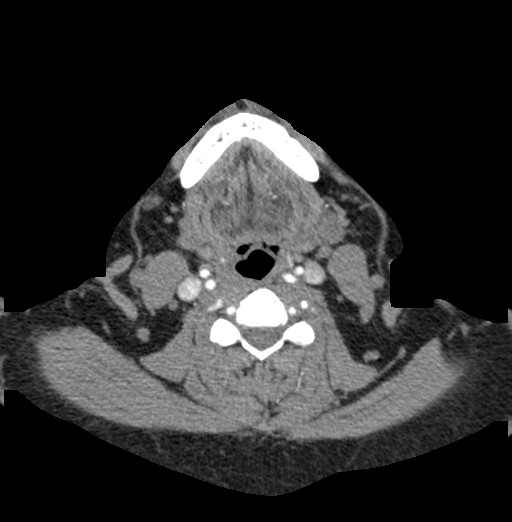
[im 203/356  bone]
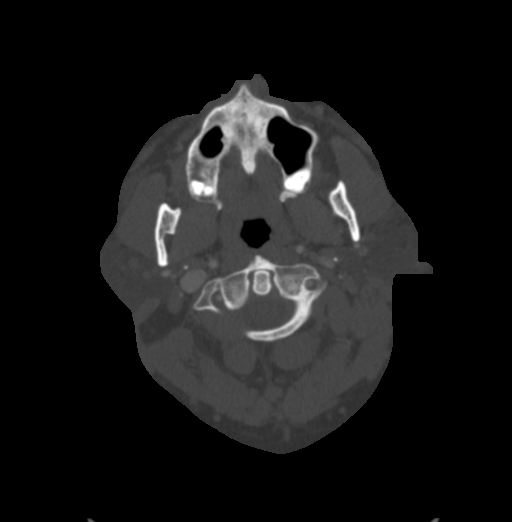
[im 254/356  soft-tissue]
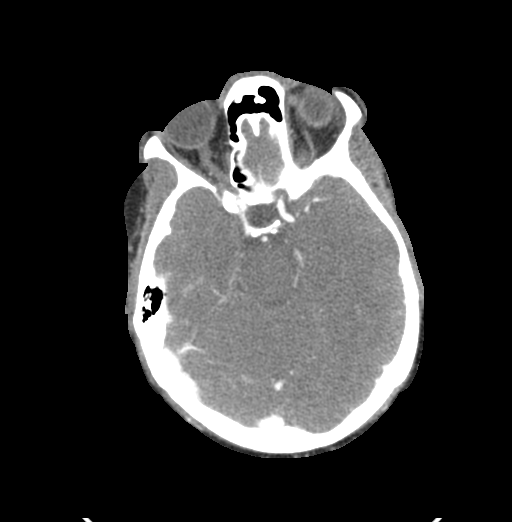
[im 305/356  bone]
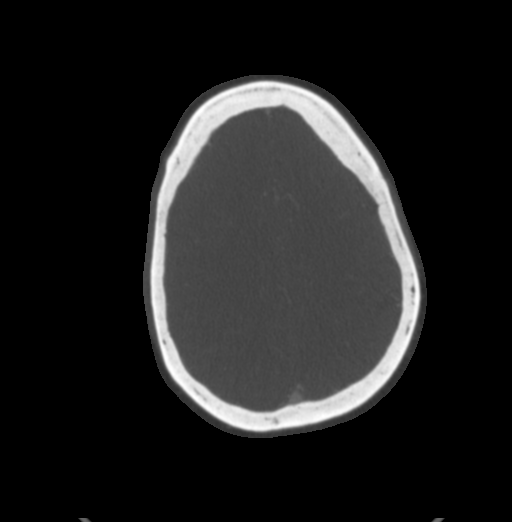

[8 of 33 positions shown; findings below may reference images not displayed]

FINDINGS: CTA NECK

Aortic arch: Patent great vessel origins.

Right carotid system: Patent. Trace calcified plaque at the common
carotid bifurcation. No ICA origin stenosis.

Left carotid system: Patent.  No ICA origin stenosis.

Vertebral arteries: Patent and codominant.  No stenosis.

Skeleton: Mild cervical spine degenerative changes.

Other neck: Unremarkable.

Upper chest: Included upper lungs are clear.

Review of the MIP images confirms the above findings

CTA HEAD

Anterior circulation: Intracranial internal carotid arteries are
patent with trace calcified plaque. Anterior and middle cerebral
arteries are patent.

Posterior circulation: Intracranial vertebral arteries, basilar
artery, and posterior cerebral arteries are patent.

Venous sinuses: Patent as allowed by contrast bolus timing.

Review of the MIP images confirms the above findings
IMPRESSION: No proximal intracranial vessel occlusion or significant stenosis.

## 2020-11-21 IMAGING — CT CT ANGIO HEAD
2 of 7 series · 8 of 33 positions shown · IV contrast (APPLIED)
Comparison: [DATE]

CLINICAL DATA: Code stroke follow-up for right facial numbness

EXAM:
CT ANGIOGRAPHY HEAD AND NECK
TECHNIQUE: Multidetector CT imaging of the head and neck was performed using
the standard protocol during bolus administration of intravenous
contrast. Multiplanar CT image reconstructions and MIPs were
obtained to evaluate the vascular anatomy. Carotid stenosis
measurements (when applicable) are obtained utilizing NASCET
criteria, using the distal internal carotid diameter as the
denominator.
CONTRAST:  75mL OMNIPAQUE IOHEXOL 350 MG/ML SOLN

[Series 4: cta head neck · axial · 0.47mm/px · z∈[+108,+226]mm · 2 of 179 slices shown]
[im 60/179  soft-tissue]
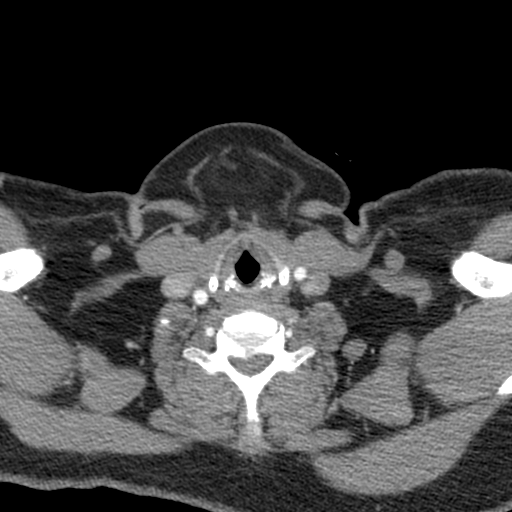
[im 119/179  soft-tissue]
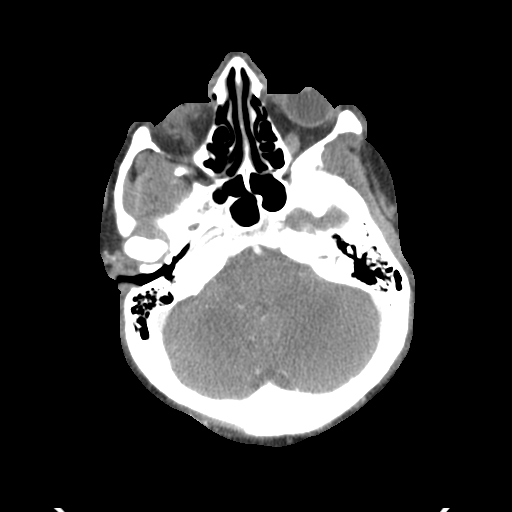

[Series 6: ax thin · axial · 0.48mm/px · z∈[+40,+294]mm · 6 of 356 slices shown]
[im 51/356  soft-tissue]
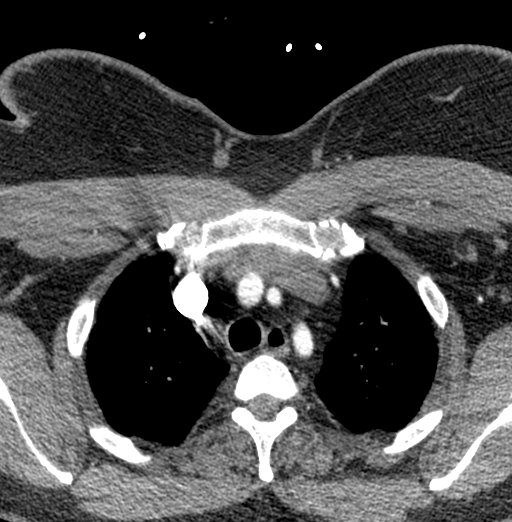
[im 102/356  bone]
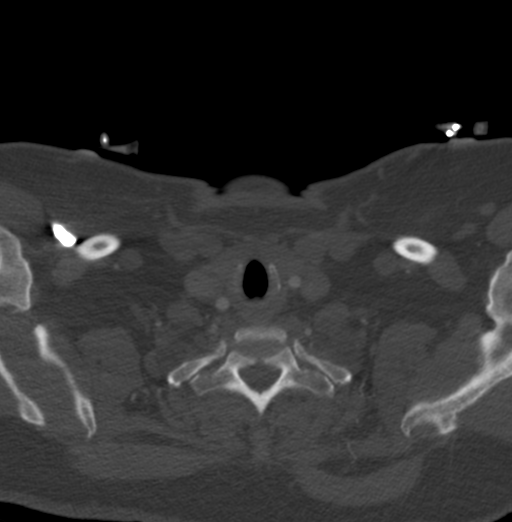
[im 153/356  soft-tissue]
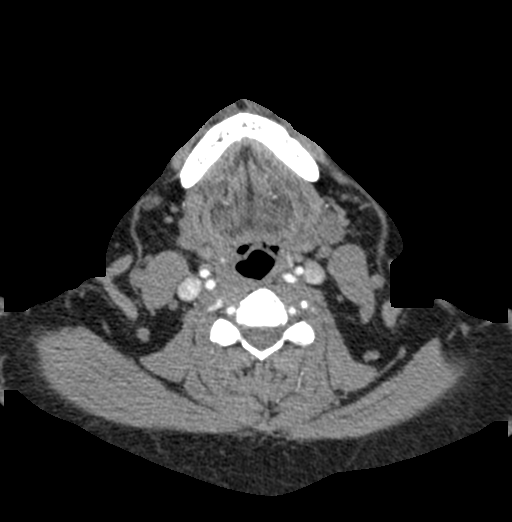
[im 203/356  bone]
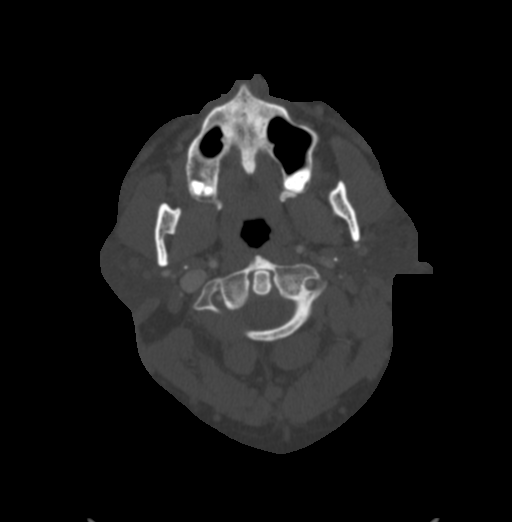
[im 254/356  soft-tissue]
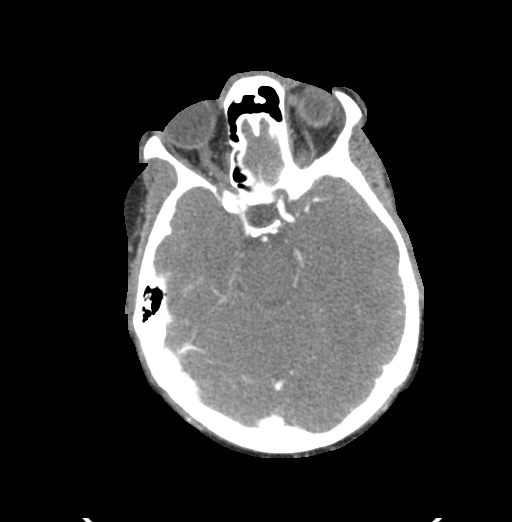
[im 305/356  bone]
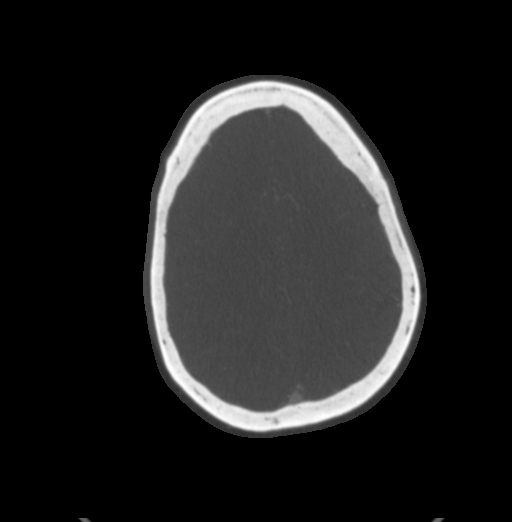

[8 of 33 positions shown; findings below may reference images not displayed]

FINDINGS: CTA NECK

Aortic arch: Patent great vessel origins.

Right carotid system: Patent. Trace calcified plaque at the common
carotid bifurcation. No ICA origin stenosis.

Left carotid system: Patent.  No ICA origin stenosis.

Vertebral arteries: Patent and codominant.  No stenosis.

Skeleton: Mild cervical spine degenerative changes.

Other neck: Unremarkable.

Upper chest: Included upper lungs are clear.

Review of the MIP images confirms the above findings

CTA HEAD

Anterior circulation: Intracranial internal carotid arteries are
patent with trace calcified plaque. Anterior and middle cerebral
arteries are patent.

Posterior circulation: Intracranial vertebral arteries, basilar
artery, and posterior cerebral arteries are patent.

Venous sinuses: Patent as allowed by contrast bolus timing.

Review of the MIP images confirms the above findings
IMPRESSION: No proximal intracranial vessel occlusion or significant stenosis.

## 2020-11-21 MED ORDER — ATORVASTATIN CALCIUM 20 MG PO TABS: 20.0000 mg | ORAL_TABLET | Freq: Every day | ORAL | 0 refills | Status: DC

## 2020-11-21 MED ORDER — ACETAMINOPHEN 325 MG PO TABS: 650.0000 mg | ORAL_TABLET | ORAL | Status: DC | PRN

## 2020-11-21 MED ORDER — METHYLPREDNISOLONE 4 MG PO TBPK
8.0000 mg | ORAL_TABLET | Freq: Every morning | ORAL | Status: DC
  Filled 2020-11-21: qty 21

## 2020-11-21 MED ORDER — ATORVASTATIN CALCIUM 20 MG PO TABS: 20.0000 mg | ORAL_TABLET | Freq: Every day | ORAL | Status: DC

## 2020-11-21 MED ORDER — STROKE: EARLY STAGES OF RECOVERY BOOK: Freq: Once | Status: DC

## 2020-11-21 MED ORDER — ASPIRIN 300 MG RE SUPP
300.0000 mg | Freq: Every day | RECTAL | Status: DC
  Administered 2020-11-21: 300 mg via RECTAL
  Filled 2020-11-21 (×2): qty 1

## 2020-11-21 MED ORDER — ATORVASTATIN CALCIUM 20 MG PO TABS
20.0000 mg | ORAL_TABLET | Freq: Every day | ORAL | Status: DC
  Administered 2020-11-21: 20 mg via ORAL
  Filled 2020-11-21: qty 1

## 2020-11-21 MED ORDER — AMLODIPINE BESYLATE 5 MG PO TABS
2.5000 mg | ORAL_TABLET | Freq: Every day | ORAL | Status: DC
  Administered 2020-11-21: 2.5 mg via ORAL
  Filled 2020-11-21: qty 1

## 2020-11-21 MED ORDER — METHYLPREDNISOLONE 4 MG PO TBPK: 4.0000 mg | ORAL_TABLET | Freq: Three times a day (TID) | ORAL | Status: DC

## 2020-11-21 MED ORDER — METHYLPREDNISOLONE 4 MG PO TBPK: 4.0000 mg | ORAL_TABLET | Freq: Four times a day (QID) | ORAL | Status: DC

## 2020-11-21 MED ORDER — CYCLOBENZAPRINE HCL 10 MG PO TABS: 10.0000 mg | ORAL_TABLET | Freq: Three times a day (TID) | ORAL | Status: DC | PRN

## 2020-11-21 MED ORDER — METHYLPREDNISOLONE 4 MG PO TBPK: 4.0000 mg | ORAL_TABLET | ORAL | Status: DC

## 2020-11-21 MED ORDER — FAMOTIDINE 20 MG PO TABS
40.0000 mg | ORAL_TABLET | Freq: Two times a day (BID) | ORAL | Status: DC
  Administered 2020-11-21: 40 mg via ORAL
  Filled 2020-11-21: qty 2

## 2020-11-21 MED ORDER — METHYLPREDNISOLONE 4 MG PO TBPK
ORAL_TABLET | ORAL | Status: DC
  Filled 2020-11-21: qty 21

## 2020-11-21 MED ORDER — ASPIRIN EC 81 MG PO TBEC: 81.0000 mg | DELAYED_RELEASE_TABLET | Freq: Every day | ORAL | 0 refills | Status: AC

## 2020-11-21 MED ORDER — ASPIRIN EC 81 MG PO TBEC: 81.0000 mg | DELAYED_RELEASE_TABLET | Freq: Every day | ORAL | 0 refills | Status: DC

## 2020-11-21 MED ORDER — ENOXAPARIN SODIUM 60 MG/0.6ML ~~LOC~~ SOLN
0.5000 mg/kg | SUBCUTANEOUS | Status: DC
  Administered 2020-11-21: 55 mg via SUBCUTANEOUS
  Filled 2020-11-21: qty 0.6

## 2020-11-21 MED ORDER — METHYLPREDNISOLONE 4 MG PO TBPK: 8.0000 mg | ORAL_TABLET | Freq: Every evening | ORAL | Status: DC

## 2020-11-21 MED ORDER — ACETAMINOPHEN 650 MG RE SUPP: 650.0000 mg | RECTAL | Status: DC | PRN

## 2020-11-21 MED ORDER — GABAPENTIN 300 MG PO CAPS: 300.0000 mg | ORAL_CAPSULE | Freq: Every day | ORAL | 0 refills | Status: DC

## 2020-11-21 MED ORDER — SODIUM CHLORIDE 0.9 % IV SOLN: INTRAVENOUS | Status: DC

## 2020-11-21 MED ORDER — GABAPENTIN 300 MG PO CAPS: 300.0000 mg | ORAL_CAPSULE | Freq: Every day | ORAL | Status: DC

## 2020-11-21 MED ORDER — ACETAMINOPHEN 160 MG/5ML PO SOLN
650.0000 mg | ORAL | Status: DC | PRN
  Filled 2020-11-21: qty 20.3

## 2020-11-21 MED ORDER — METHYLPREDNISOLONE 4 MG PO TBPK: ORAL_TABLET | ORAL | 0 refills | Status: DC

## 2020-11-21 MED ORDER — SENNOSIDES-DOCUSATE SODIUM 8.6-50 MG PO TABS: 1.0000 | ORAL_TABLET | Freq: Every evening | ORAL | Status: DC | PRN

## 2020-11-21 MED ORDER — ASPIRIN 325 MG PO TABS
325.0000 mg | ORAL_TABLET | Freq: Every day | ORAL | Status: DC
  Filled 2020-11-21: qty 1

## 2020-11-21 MED ORDER — LEVOTHYROXINE SODIUM 50 MCG PO TABS
125.0000 ug | ORAL_TABLET | Freq: Every day | ORAL | Status: DC
  Administered 2020-11-21: 125 ug via ORAL
  Filled 2020-11-21: qty 3

## 2020-11-21 MED ORDER — IOHEXOL 350 MG/ML SOLN
75.0000 mL | Freq: Once | INTRAVENOUS | Status: AC | PRN
  Administered 2020-11-21: 75 mL via INTRAVENOUS

## 2020-11-21 NOTE — Progress Notes (Signed)
SLP Cancellation Note  Patient Details Name: Morgan Stevens MRN: [id number] DOB: 12/13/1965   Cancelled treatment:       Reason Eval/Treat Not Completed: SLP screened, no needs identified, will sign off  Alydia Gosser B. Dreama Saa M.S., CCC-SLP, [facility] Speech-Language Pathologist Rehabilitation Services Office [phone]  [name] 11/21/2020, 12:54 PM

## 2020-11-21 NOTE — Discharge Instructions (Signed)
https://www.nhlbi.nih.gov/files/docs/public/heart/dash_brief.pdf">  DASH Eating Plan DASH stands for Dietary Approaches to Stop Hypertension. The DASH eating plan is a healthy eating plan that has been shown to:  Reduce high blood pressure (hypertension).  Reduce your risk for type 2 diabetes, heart disease, and stroke.  Help with weight loss. What are tips for following this plan? Reading food labels  Check food labels for the amount of salt (sodium) per serving. Choose foods with less than 5 percent of the Daily Value of sodium. Generally, foods with less than 300 milligrams (mg) of sodium per serving fit into this eating plan.  To find whole grains, look for the word "whole" as the first word in the ingredient list. Shopping  Buy products labeled as "low-sodium" or "no salt added."  Buy fresh foods. Avoid canned foods and pre-made or frozen meals. Cooking  Avoid adding salt when cooking. Use salt-free seasonings or herbs instead of table salt or sea salt. Check with your health care provider or pharmacist before using salt substitutes.  Do not fry foods. Cook foods using healthy methods such as baking, boiling, grilling, roasting, and broiling instead.  Cook with heart-healthy oils, such as olive, canola, avocado, soybean, or sunflower oil. Meal planning  Eat a balanced diet that includes: ? 4 or more servings of fruits and 4 or more servings of vegetables each day. Try to fill one-half of your plate with fruits and vegetables. ? 6-8 servings of whole grains each day. ? Less than 6 oz (170 g) of lean meat, poultry, or fish each day. A 3-oz (85-g) serving of meat is about the same size as a deck of cards. One egg equals 1 oz (28 g). ? 2-3 servings of low-fat dairy each day. One serving is 1 cup (237 mL). ? 1 serving of nuts, seeds, or beans 5 times each week. ? 2-3 servings of heart-healthy fats. Healthy fats called omega-3 fatty acids are found in foods such as walnuts,  flaxseeds, fortified milks, and eggs. These fats are also found in cold-water fish, such as sardines, salmon, and mackerel.  Limit how much you eat of: ? Canned or prepackaged foods. ? Food that is high in trans fat, such as some fried foods. ? Food that is high in saturated fat, such as fatty meat. ? Desserts and other sweets, sugary drinks, and other foods with added sugar. ? Full-fat dairy products.  Do not salt foods before eating.  Do not eat more than 4 egg yolks a week.  Try to eat at least 2 vegetarian meals a week.  Eat more home-cooked food and less restaurant, buffet, and fast food.   Lifestyle  When eating at a restaurant, ask that your food be prepared with less salt or no salt, if possible.  If you drink alcohol: ? Limit how much you use to:  0-1 drink a day for women who are not pregnant.  0-2 drinks a day for men. ? Be aware of how much alcohol is in your drink. In the [place] one drink equals one 12 oz bottle of beer (355 mL), one 5 oz glass of wine (148 mL), or one 1 oz glass of hard liquor (44 mL). General information  Avoid eating more than 2,300 mg of salt a day. If you have hypertension, you may need to reduce your sodium intake to 1,500 mg a day.  Work with your health care provider to maintain a healthy body weight or to lose weight. Ask what an ideal weight is for   you.  Get at least 30 minutes of exercise that causes your heart to beat faster (aerobic exercise) most days of the week. Activities may include walking, swimming, or biking.  Work with your health care provider or dietitian to adjust your eating plan to your individual calorie needs. What foods should I eat? Fruits All fresh, dried, or frozen fruit. Canned fruit in natural juice (without added sugar). Vegetables Fresh or frozen vegetables (raw, steamed, roasted, or grilled). Low-sodium or reduced-sodium tomato and vegetable juice. Low-sodium or reduced-sodium tomato sauce and tomato paste.  Low-sodium or reduced-sodium canned vegetables. Grains Whole-grain or whole-wheat bread. Whole-grain or whole-wheat pasta. Brown rice. Oatmeal. Quinoa. Bulgur. Whole-grain and low-sodium cereals. Pita bread. Low-fat, low-sodium crackers. Whole-wheat flour tortillas. Meats and other proteins Skinless chicken or turkey. Ground chicken or turkey. Pork with fat trimmed off. Fish and seafood. Egg whites. Dried beans, peas, or lentils. Unsalted nuts, nut butters, and seeds. Unsalted canned beans. Lean cuts of beef with fat trimmed off. Low-sodium, lean precooked or cured meat, such as sausages or meat loaves. Dairy Low-fat (1%) or fat-free (skim) milk. Reduced-fat, low-fat, or fat-free cheeses. Nonfat, low-sodium ricotta or cottage cheese. Low-fat or nonfat yogurt. Low-fat, low-sodium cheese. Fats and oils Soft margarine without trans fats. Vegetable oil. Reduced-fat, low-fat, or light mayonnaise and salad dressings (reduced-sodium). Canola, safflower, olive, avocado, soybean, and sunflower oils. Avocado. Seasonings and condiments Herbs. Spices. Seasoning mixes without salt. Other foods Unsalted popcorn and pretzels. Fat-free sweets. The items listed above may not be a complete list of foods and beverages you can eat. Contact a dietitian for more information. What foods should I avoid? Fruits Canned fruit in a light or heavy syrup. Fried fruit. Fruit in cream or butter sauce. Vegetables Creamed or fried vegetables. Vegetables in a cheese sauce. Regular canned vegetables (not low-sodium or reduced-sodium). Regular canned tomato sauce and paste (not low-sodium or reduced-sodium). Regular tomato and vegetable juice (not low-sodium or reduced-sodium). Pickles. Olives. Grains Baked goods made with fat, such as croissants, muffins, or some breads. Dry pasta or rice meal packs. Meats and other proteins Fatty cuts of meat. Ribs. Fried meat. Bacon. Bologna, salami, and other precooked or cured meats, such as  sausages or meat loaves. Fat from the back of a pig (fatback). Bratwurst. Salted nuts and seeds. Canned beans with added salt. Canned or smoked fish. Whole eggs or egg yolks. Chicken or turkey with skin. Dairy Whole or 2% milk, cream, and half-and-half. Whole or full-fat cream cheese. Whole-fat or sweetened yogurt. Full-fat cheese. Nondairy creamers. Whipped toppings. Processed cheese and cheese spreads. Fats and oils Butter. Stick margarine. Lard. Shortening. Ghee. Bacon fat. Tropical oils, such as coconut, palm kernel, or palm oil. Seasonings and condiments Onion salt, garlic salt, seasoned salt, table salt, and sea salt. Worcestershire sauce. Tartar sauce. Barbecue sauce. Teriyaki sauce. Soy sauce, including reduced-sodium. Steak sauce. Canned and packaged gravies. Fish sauce. Oyster sauce. Cocktail sauce. Store-bought horseradish. Ketchup. Mustard. Meat flavorings and tenderizers. Bouillon cubes. Hot sauces. Pre-made or packaged marinades. Pre-made or packaged taco seasonings. Relishes. Regular salad dressings. Other foods Salted popcorn and pretzels. The items listed above may not be a complete list of foods and beverages you should avoid. Contact a dietitian for more information. Where to find more information  National Heart, Lung, and Blood Institute: www.nhlbi.nih.gov  American Heart Association: www.heart.org  Academy of Nutrition and Dietetics: www.eatright.org  National Kidney Foundation: www.kidney.org Summary  The DASH eating plan is a healthy eating plan that has been shown to   reduce high blood pressure (hypertension). It may also reduce your risk for type 2 diabetes, heart disease, and stroke.  When on the DASH eating plan, aim to eat more fresh fruits and vegetables, whole grains, lean proteins, low-fat dairy, and heart-healthy fats.  With the DASH eating plan, you should limit salt (sodium) intake to 2,300 mg a day. If you have hypertension, you may need to reduce your  sodium intake to 1,500 mg a day.  Work with your health care provider or dietitian to adjust your eating plan to your individual calorie needs. This information is not intended to replace advice given to you by your health care provider. Make sure you discuss any questions you have with your health care provider. Document Revised: 07/17/2019 Document Reviewed: 07/17/2019 Elsevier Patient Education  2021 Elsevier Inc. Warning Signs of a Stroke A stroke is a medical emergency and should be treated right away--every second counts. A stroke is caused by a decrease or block in blood flow to the brain. When certain areas of the brain do not get enough oxygen, brain cells begin to die. A stroke can lead to brain damage and can sometimes be life-threatening. However, if a person gets medical treatment right away, he or she has a better chance of surviving and recovering from a stroke. It is very important to be able to recognize the symptoms of a stroke. Types of strokes There are two main types of strokes:  Ischemic stroke. This is the most common type. This stroke happens when a blood vessel that supplies blood to the brain is blocked.  Hemorrhagic stroke. This results from bleeding in the brain when a blood vessel leaks or bursts (ruptures). A transient ischemic attack (TIA) causes stroke-like symptoms that go away quickly. Unlike a stroke, a TIA does not cause permanent damage to the brain. However, the symptoms of a TIA are the same as a stroke. TIAs also require medical treatment right away. Having a TIA is a sign that you are at higher risk for a stroke. Warning signs of a stroke The symptoms of stroke may vary and will reflect the part of the brain that is involved. Symptoms usually happen suddenly. "BE FAST" is an easy way to remember the main warning signs of a stroke:  B - Balance. Signs are dizziness, sudden trouble walking, or loss of balance.  E - Eyes. Signs are trouble seeing or a  sudden change in vision.  F - Face. Signs are sudden weakness or numbness of the face, or the face or eyelid drooping on one side.  A - Arms. Signs are weakness or numbness in an arm. This happens suddenly and usually on one side of the body.  S - Speech. Signs are sudden trouble speaking, slurred speech, or trouble understanding what people say.  T - Time. Time to call emergency services. Write down what time symptoms started. Other signs of a stroke Some less common signs of a stroke include:  A sudden, severe headache with no known cause.  Nausea or vomiting.  Seizure. A stroke may be happening even if only one "BE FAST" symptom is present. These symptoms may represent a serious problem that is an emergency. Do not wait to see if the symptoms will go away. Get medical help right away. Call your local emergency services (911 in the [place] Do not drive yourself to the hospital.   Summary  A stroke is a medical emergency and should be treated right away--every second counts.  "  BE FAST" is an easy way to remember the main warning signs of a stroke.  Call your local emergency services right away if you or someone else has any stroke symptoms, even if the symptoms go away.  Make note of what time the first symptoms appeared. Emergency responders or emergency room staff will need to know this information.  Do not wait to see if symptoms will go away. Call 911 even if only one of the "BE FAST" symptoms appears. This information is not intended to replace advice given to you by your health care provider. Make sure you discuss any questions you have with your health care provider. Document Revised: 03/14/2020 Document Reviewed: 03/14/2020 Elsevier Patient Education  2021 Elsevier Inc.   Stroke Prevention Some medical conditions and lifestyle choices can lead to a higher risk for a stroke. You can help to prevent a stroke by eating healthy foods and exercising. It also helps to not smoke  and to manage any health problems you may have. How can this condition affect me? A stroke is an emergency. It should be treated right away. A stroke can lead to brain damage or threaten your life. There is a better chance of surviving and getting better after a stroke if you get medical help right away. What can increase my risk? The following medical conditions may increase your risk of a stroke:  Diseases of the heart and blood vessels (cardiovascular disease).  High blood pressure (hypertension).  Diabetes.  High cholesterol.  Sickle cell disease.  Problems with blood clotting.  Being very overweight.  Sleeping problems (obstructivesleep apnea). Other risk factors include:  Being older than age 17.  A history of blood clots, stroke, or mini-stroke (TIA).  Race, ethnic background, or a family history of stroke.  Smoking or using tobacco products.  Taking birth control pills, especially if you smoke.  Heavy alcohol and drug use.  Not being active. What actions can I take to prevent this? Manage your health conditions  High cholesterol. ? Eat a healthy diet. If this is not enough to manage your cholesterol, you may need to take medicines. ? Take medicines as told by your doctor.  High blood pressure. ? Try to keep your blood pressure below 130/80. ? If your blood pressure cannot be managed through a healthy diet and regular exercise, you may need to take medicines. ? Take medicines as told by your doctor. ? Ask your doctor if you should check your blood pressure at home. ? Have your blood pressure checked every year.  Diabetes. ? Eat a healthy diet and get regular exercise. If your blood sugar (glucose) cannot be managed through diet and exercise, you may need to take medicines. ? Take medicines as told by your doctor.  Talk to your doctor about getting checked for sleeping problems. Signs of a problem can include: ? Snoring a lot. ? Feeling very  tired.  Make sure that you manage any other conditions you have. Nutrition  Follow instructions from your doctor about what to eat or drink. You may be told to: ? Eat and drink fewer calories each day. ? Limit how much salt (sodium) you use to 1,500 milligrams (mg) each day. ? Use only healthy fats for cooking, such as olive oil, canola oil, and sunflower oil. ? Eat healthy foods. To do this:  Choose foods that are high in fiber. These include whole grains, and fresh fruits and vegetables.  Eat at least 5 servings of fruits and vegetables  a day. Try to fill one-half of your plate with fruits and vegetables at each meal.  Choose low-fat (lean) proteins. These include low-fat cuts of meat, chicken without skin, fish, tofu, beans, and nuts.  Eat low-fat dairy products. ? Avoid foods that:  Are high in salt.  Have saturated fat.  Have trans fat.  Have cholesterol.  Are processed or pre-made. ? Count how many carbohydrates you eat and drink each day.   Lifestyle  If you drink alcohol: ? Limit how much you have to:  0-1 drink a day for women who are not pregnant.  0-2 drinks a day for men. ? Know how much alcohol is in your drink. In the [place] one drink equals one 12 oz bottle of beer ( ), one 5 oz glass of wine ( ), or one 1 oz glass of hard liquor (38mL).  Do not smoke or use any products that have nicotine or tobacco. If you need help quitting, ask your doctor.  Avoid secondhand smoke.  Do not use drugs. Activity  Try to stay at a healthy weight.  Get at least 30 minutes of exercise on most days, such as: ? Fast walking. ? Biking. ? Swimming.   Medicines  Take over-the-counter and prescription medicines only as told by your doctor.  Avoid taking birth control pills. Talk to your doctor about the risks of taking birth control pills if: ? You are over 38 years old. ? You smoke. ? You get very bad headaches. ? You have had a blood clot. Where to find  more information  American Stroke Association: www.strokeassociation.org Get help right away if:  You or a loved one has any signs of a stroke. "BE FAST" is an easy way to remember the warning signs: ? B - Balance. Dizziness, sudden trouble walking, or loss of balance. ? E - Eyes. Trouble seeing or a change in how you see. ? F - Face. Sudden weakness or loss of feeling of the face. The face or eyelid may droop on one side. ? A - Arms. Weakness or loss of feeling in an arm. This happens all of a sudden and most often on one side of the body. ? S - Speech. Sudden trouble speaking, slurred speech, or trouble understanding what people say. ? T - Time. Time to call emergency services. Write down what time symptoms started.  You or a loved one has other signs of a stroke, such as: ? A sudden, very bad headache with no known cause. ? Feeling like you may vomit (nausea). ? Vomiting. ? A seizure. These symptoms may be an emergency. Get help right away. Call your local emergency services (911 in the [place]  Do not wait to see if the symptoms will go away.  Do not drive yourself to the hospital. Summary  You can help to prevent a stroke by eating healthy, exercising, and not smoking. It also helps to manage any health problems you have.  Do not smoke or use any products that contain nicotine or tobacco.  Get help right away if you or a loved one has any signs of a stroke. This information is not intended to replace advice given to you by your health care provider. Make sure you discuss any questions you have with your health care provider. Document Revised: 03/14/2020 Document Reviewed: 03/14/2020 Elsevier Patient Education  2021 [employer].

## 2020-11-21 NOTE — Progress Notes (Signed)
Neurosurgery notified to see patient who may be discharged today after MRI C spine revealed concerns for degenerative stenosis. Upon chart review she was seen in ER by staff there as well as Hospitalist service and Neurology. No weakness reported; new numbness noted. Per Neurology HPI  Morgan Stevens is a 55 y.o. female with a past medical history significant for obesity, hypertension, hypothyroidism and GERD  She presented to the ED on 3/27 with acute onset right-sided face, lip and tongue numbness.  Initial NIH stroke scale was 2 for right facial numbness and slight right lower leg drift, which was felt to be nondisabling given she was able to ambulate without assistance.  She reports that the numbness sensation started in the TMJ area and then spread over a few minutes to involve her face and tongue.  Approximately 20 minutes later she also had some right ulnar arm tingling as well as tingling into the hand.  This continued while she had the MRI brain completed but then resolved some time while she was sleeping after the MRI.   Imaging Report: CTA Head and neck:  IMPRESSION: No proximal intracranial vessel occlusion or significant stenosis.  MRI Brain:  IMPRESSION: Normal brain MRI.  MRI Cervical Spine: IMPRESSION: Central disc protrusion at C5-6 indents the ventral cord.  Right paracentral protrusion at C6-7 deforms the right hemicord.  Small central protrusion at C3-4 contacts the cord without cord deformity.  Shallow disc bulges C4-5 effaces the ventral thecal sac. Mild bilateral foraminal narrowing at this level is worse on the right.   I have personally reviewed the imaging. No evidence of cord signal change noted additionally, these degenerative findings would not cause facial/lip/tongue sensory changes. I will arrange outpatient clinic follow up at [facility] in 1-2 weeks. I have also recommended a Medrol Dose Pack and gabapentin for the symptoms consistent  with radiculopathy. Prior to any consideration of intervention would need full clearance from CVA risk standpoint as her symptoms concerning for possible TIA event.  Please contact me with any questions directly. Thank you  Noralee Stain, MD

## 2020-11-21 NOTE — Evaluation (Signed)
Physical Therapy Evaluation Patient Details Name: Terriona Horlacher MRN: [id number] DOB: 1966/05/27 Today's Date: 11/21/2020   History of Present Illness  55 y.o. female with medical history significant for GERD, hypothyroidism and obesity, who presented to the emergency room with acute onset of right upper and lower extremity numbness with associated right facial numbness without focal muscle weakness.  The patient denied any headache or dizziness or blurred vision.  No vertigo or tinnitus.  No urinary or stool incontinence.  No witnessed seizures.  The patient denied any chest pain or palpitations.  No fever or chills. Imaging negative for acute infarct.    Clinical Impression  Pt alert, agreeable to PT, sitting at EOB with OT upon entering room. 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   The patient demonstrated LE strength, coordination and sensation WFLs. Did note more effort needed for MMT of RLE but no true strength deficit noted. The patient performed bed mobility, transfers, and ambulation independently, no LOB noted. Pt also able to heel/toe walk and perform tandem stance at request of neurology. The patient demonstrated and reported return to baseline level of functioning, no further acute PT needs indicated. PT to sign off. Please reconsult PT if pt status changes or acute needs are identified.      Follow Up Recommendations No PT follow up    Equipment Recommendations  None recommended by PT    Recommendations for Other Services       Precautions / Restrictions Precautions Precautions: Fall Precaution Comments: low fall risk Restrictions Weight Bearing Restrictions: No      Mobility  Bed Mobility Overal bed mobility: Modified Independent;Independent             General bed mobility comments: sitting EOB with OT, per Ot modI/I    Transfers Overall transfer level: Independent               General transfer comment: able to  stand independently  Ambulation/Gait Ambulation/Gait assistance: Independent Gait Distance (Feet): 150 Feet Assistive device: None Gait Pattern/deviations: WFL(Within Functional Limits) Gait velocity: a bit slower compared to her baseline, but WFLs      Stairs            Wheelchair Mobility    Modified Rankin (Stroke Patients Only)       Balance Overall balance assessment: Independent                                           Pertinent Vitals/Pain Pain Assessment: No/denies pain    Home Living Family/patient expects to be discharged to:: Private residence Living Arrangements: Spouse/significant other;Children (5 children ages 76-21)   Type of Home: House Home Access: Stairs to enter Entrance Stairs-Rails: Right Entrance Stairs-Number of Steps: 4-5 Home Layout: One level Home Equipment: None      Prior Function Level of Independence: Independent         Comments: Pt indep with mobility, ADL, IADL including driving     Hand Dominance   Dominant Hand: Right    Extremity/Trunk Assessment   Upper Extremity Assessment Upper Extremity Assessment: Defer to OT evaluation    Lower Extremity Assessment Lower Extremity Assessment: LLE deficits/detail;RLE deficits/detail RLE Deficits / Details: grossly 4+/5, MMT testing a bit more effortful for patient compared to L. Pt reported light touch sensation was back to baseline RLE Sensation: WNL RLE Coordination:  WNL LLE Deficits / Details: 5/5 LLE Sensation: WNL LLE Coordination: WNL    Cervical / Trunk Assessment Cervical / Trunk Assessment: Normal  Communication   Communication: No difficulties  Cognition Arousal/Alertness: Awake/alert Behavior During Therapy: WFL for tasks assessed/performed Overall Cognitive Status: Within Functional Limits for tasks assessed                                        General Comments General comments (skin integrity, edema, etc.): pt  able to heel and toe walk at request of neurologist, tandem stance. WFLs    Exercises     Assessment/Plan    PT Assessment Patent does not need any further PT services  PT Problem List         PT Treatment Interventions      PT Goals (Current goals can be found in the Care Plan section)  Acute Rehab PT Goals Patient Stated Goal: go home    Frequency     Barriers to discharge        Co-evaluation               AM-PAC PT "6 Clicks" Mobility  Outcome Measure Help needed turning from your back to your side while in a flat bed without using bedrails?: None Help needed moving from lying on your back to sitting on the side of a flat bed without using bedrails?: None Help needed moving to and from a bed to a chair (including a wheelchair)?: None Help needed standing up from a chair using your arms (e.g., wheelchair or bedside chair)?: None Help needed to walk in hospital room?: None Help needed climbing 3-5 steps with a railing? : None 6 Click Score: 24    End of Session Equipment Utilized During Treatment: Gait belt Activity Tolerance: Patient tolerated treatment well Patient left: in bed;Other (comment) (with MD at bedside) Nurse Communication: Mobility status PT Visit Diagnosis: Other abnormalities of gait and mobility (R26.89)    Time: 7482-7078 PT Time Calculation (min) (ACUTE ONLY): 10 min   Charges:   PT Evaluation $PT Eval Low Complexity: 1 Low          Olga Coaster PT, DPT 9:43 AM,11/21/20

## 2020-11-21 NOTE — Consult Note (Addendum)
Neurology Consultation Reason for Consult: Right face/lip/tongue numbness Requesting Physician: Valente David  CC: Right face numbness, right arm tingling  History is obtained from: Patient and chart review  HPI: Morgan Stevens is a 55 y.o. female with a past medical history significant for obesity, hypertension, hypothyroidism and GERD  She presented to the ED on 3/27 with acute onset right-sided face, lip and tongue numbness.  Initial NIH stroke scale was 2 for right facial numbness and slight right lower leg drift, which was felt to be nondisabling given she was able to ambulate without assistance.  She reports that the numbness sensation started in the TMJ area and then spread over a few minutes to involve her face and tongue.  Approximately 20 minutes later she also had some right ulnar arm tingling as well as tingling into the hand.  This continued while she had the MRI brain completed but then resolved some time while she was sleeping after the MRI.  She denies any headache associated with this event noting that she is due for her menstrual cycle and does get some mild headaches with her cycles (not associated with light or sound sensitivity, nausea, or vomiting).  She did feel like she had some reduced hearing in the right ear during this episode.  She reports she was recently started on amlodipine due to hypertension, and that she has been working on improving her diet and exercise  Notably she had a false positive nuclear study in 2017 followed by a left heart catheterization which revealed normal left ventricular systolic function and no coronary artery disease.  Additionally she has had multiple ultrasounds of the left lower extremity (August 2017, July 2018, May 2020) all of which have been negative for DVT; she reports these were due to left lower extremity pain without swelling and she attributes the pain to to her varicose veins.  Her most recent echocardiogram completed on 01/05/2020  revealed normal EF with grade 1 diastolic dysfunction and mild mitral valve regurgitation  LKW: 8:10 PM on 3/27 tPA given?: No, due to nondisabling symptoms Premorbid modified rankin scale:      0 - No symptoms.   ROS: All other review of systems was negative except as noted in the HPI.   Past Medical History:  Diagnosis Date  . Gastritis   . Hiatal hernia   . Obesity (BMI 30-39.9)   . Reflux   . Thyroid disease    Past Surgical History:  Procedure Laterality Date  . CARDIAC CATHETERIZATION  02/27/2016   Procedure: Left Heart Cath and Coronary Angiography;  Surgeon: Corky Crafts, MD;  Location: [facility] INVASIVE CV LAB;  Service: Cardiovascular;;  . CESAREAN SECTION    . CHOLECYSTECTOMY    . COLONOSCOPY WITH PROPOFOL N/A 04/15/2019   Procedure: COLONOSCOPY WITH PROPOFOL;  Surgeon: Pasty Spillers, MD;  Location: [facility] ENDOSCOPY;  Service: Endoscopy;  Laterality: N/A;  . ESOPHAGOGASTRODUODENOSCOPY (EGD) WITH PROPOFOL N/A 04/15/2019   Procedure: ESOPHAGOGASTRODUODENOSCOPY (EGD) WITH PROPOFOL;  Surgeon: Pasty Spillers, MD;  Location: [facility] ENDOSCOPY;  Service: Endoscopy;  Laterality: N/A;  . THYROIDECTOMY    . THYROIDECTOMY      Current Meds  Medication Sig  . amLODipine (NORVASC) 2.5 MG tablet Take 2.5 mg by mouth daily.  . cyclobenzaprine (FLEXERIL) 5 MG tablet Take 1 tablet (5 mg total) by mouth 3 (three) times daily as needed. (Patient taking differently: Take 10 mg by mouth 3 (three) times daily as needed for muscle spasms.)  . EUTHYROX 125 MCG  tablet Take 125 mcg by mouth every morning.  . famotidine (PEPCID) 40 MG tablet Take 40 mg by mouth 2 (two) times daily.  Marland Kitchen ibuprofen (ADVIL) 800 MG tablet Take 800 mg by mouth 4 (four) times daily as needed.     Family History  Problem Relation Age of Onset  . Breast cancer Neg Hx   She reports no known family history of strokes, hypertension or diabetes, but does report aneurysms in multiple family members including one  who passed away from an aneurysm  Social History:  reports that she has never smoked. She has never used smokeless tobacco. She reports that she does not drink alcohol and does not use drugs.   Exam: Current vital signs: BP (!) 142/79   Pulse 75   Temp 98.1 F (36.7 C) (Oral)   Resp 16   Ht 5\' 4"  (1.626 m)   Wt 110 kg   SpO2 98%   BMI 41.63 kg/m  Vital signs in last 24 hours: Temp:  [98.1 F (36.7 C)] 98.1 F (36.7 C) (03/28 0739) Pulse Rate:  [72-91] 75 (03/28 0739) Resp:  [13-22] 16 (03/28 0739) BP: (128-159)/(79-110) 142/79 (03/28 0739) SpO2:  [95 %-100 %] 98 % (03/28 0739) Weight:  [110 kg] 110 kg (03/27 2254)   Physical Exam  Constitutional: Appears well-developed and well-nourished.  Psych: Affect appropriate to situation, somewhat flat and occasionally mildly anxious Eyes: No scleral injection HENT: No oropharyngeal obstruction.  MSK: no joint deformities.  Cardiovascular: Normal rate and regular rhythm.  Respiratory: Effort normal, non-labored breathing GI: Soft.  No distension. There is no tenderness.  Skin: Warm dry and intact visible skin  Neuro: Mental Status: Patient is awake, alert, oriented to person, place, month, year, and situation. Patient is able to give a clear and coherent history. No signs of aphasia or neglect Cranial Nerves: II: Visual Fields are full. Pupils are equal, round, and reactive to light.   III,IV, VI: EOMI without ptosis or diploplia.  V: Facial sensation is symmetric to light touch VII: Facial movement is symmetric.  VIII: hearing is intact to voice X: Uvula elevates symmetrically XI: Shoulder shrug is symmetric. XII: tongue is midline without atrophy or fasciculations.  Motor: Tone is normal. Bulk is normal. 5/5 strength was present in all four extremities, other than she does have some giveaway weakness in the right upper extremity and slightly more in the right lower extremity, which is 4/5 at the hip flexor and 4+/5  otherwise.  Sensory: Sensation is symmetric to light touch and temperature in the arms and legs.  Positive reverse Phalen's test reproducing the tingling in her right hand Deep Tendon Reflexes: 3+ and symmetric in the biceps, brachioradialis, patellae, with positive Hoffmann's bilaterally, crossed abductors bilaterally, 2+ ankle jerks with no clonus.  Jaw jerk negative Plantars: Toes are downgoing bilaterally.  Cerebellar: FNF and HKS are intact bilaterally and symmetric despite weakness on confrontational testing Gait: Able to rise on heels and toes symmetrically and equally.  Able to tandem gait.  Casual gait is slow and slightly wide-based but steady and she reports it is at her baseline  NIHSS total 1 Score breakdown: Right leg drift, left leg weakness is improved  I have reviewed labs in epic and the results pertinent to this consultation are:  Lab Results  Component Value Date   CHOL 187 11/21/2020   HDL 44 11/21/2020   LDLCALC 120 (H) 11/21/2020   TRIG 117 11/21/2020   CHOLHDL 4.3 11/21/2020   Lab  Results  Component Value Date   HGBA1C 5.2 02/25/2016    I have reviewed the images obtained:  MRI brain personally reviewed, no clear acute intracranial process that there is a subtle DWI change in the left internal capsule on my personal review which may be artifact.  No significant SWI abnormalities and minimal chronic microvascular changes FLAIR  Impression: This is a 55 year old woman with stroke risk factors of hypertension (diagnosed recently), hyperlipidemia (diagnosed this admission), obesity, presenting with an episode of right-sided facial numbness followed by right arm tingling.  Given her examination is positive for reproducible tingling with compression of the carpal tunnel, suspect to the right arm symptoms were secondary to nerve compression, possibly double crush phenomenon secondary to cervical spine compression.  Carpal tunnel does not explain her right leg  weakness, at least some of which is functional based on giveaway weakness in her examination, further indicating need for MRI C-spine.  Given brisk reflexes on examination, reasonable to exclude acute cervical spine compression contributing to her symptoms.  Rarely, the trigeminal nerve can be affected by aneurysm and therefore given her family history of aneurysms will obtain CTA head and neck to exclude this possibility.  Recommendations: -MRI cervical spine -CTA head and neck -Atorvastatin 20 mg nightly -Outpatient neurology follow-up for her right arm carpal tunnel syndrome  Brooke Dare MD-PhD [facility] [phone] [facility] coverage for [facility] is from 8 AM to 4 AM in-house and 4 PM to 8 PM by telephone/video. 8 PM to 8 AM emergent questions or overnight urgent questions should be addressed to Teleneurology On-call or [facility] neurohospitalist; contact information can be found on AMION  Addendum:  CTA personally reviewed, no clinically significant aneurysm or significant atherosclerosis MRI C-spine personally reviewed, right paracentral protrusion at C6-C7 deforming the right hemicord and central disc protrusion at C5-C6 indenting the ventral cord, with some bilateral neuroforaminal narrowing (right worse than left) as well at C 4-5.  The patient's MRI C-spine findings explain her symptoms on presentation (hemifacial symptoms can localize to the cervical spine), and appreciate neurosurgery's comment on whether this needs to be addressed on an inpatient basis or with close outpatient neurosurgery follow-up.  Symptoms are not consistent with TIA given additional examination/history and work-up as above and therefore there is no neurological indication for aspirin at this time.

## 2020-11-21 NOTE — Discharge Summary (Addendum)
Discharge Summary  Morgan Stevens YQM:[id number] DOB: 1965-09-10  PCP: Patient, No Pcp Per  Admit date: 11/20/2020 Discharge date: 11/21/2020  Time spent: 35 minutes.  Recommendations for Outpatient Follow-up:  1. Follow-up with neurosurgery in 1 to 2 weeks. 2. Follow-up with neurology in 1 to 2 weeks 3. Follow-up with your primary care provider in 1 to 2 weeks 4. Take your medications as prescribed.  Per neurosurgery: No evidence of cord signal change noted additionally, these degenerative findings would not cause facial/lip/tongue sensory changes. I will arrange outpatient clinic follow up at [facility] in 1-2 weeks. I have also recommended a Medrol Dose Pack and gabapentin for the symptoms consistent with radiculopathy. Prior to any consideration of intervention would need full clearance from CVA risk standpoint as her symptoms concerning for possible TIA event.    Discharge Diagnoses:  Active Hospital Problems   Diagnosis Date Noted  . TIA (transient ischemic attack) 11/21/2020    Resolved Hospital Problems  No resolved problems to display.    Discharge Condition: Stable.  Diet recommendation: Heart healthy diet-Dash diet.  Vitals:   11/21/20 1100 11/21/20 1211  BP: (!) 146/103 (!) 154/95  Pulse: 79 80  Resp: 15 16  Temp:  98.1 F (36.7 C)  SpO2: 98% 100%    History of present illness:  Morgan Stevens is a 55 y.o. female with medical history significant for GERD, hypothyroidism and obesity, who presented to the emergency room with acute onset of right upper and lower extremity numbness with associated right facial numbness without focal muscle weakness.  The patient denied any headache or dizziness or blurred vision.  No vertigo or tinnitus.  No urinary or stool incontinence.  No witnessed seizures.  The patient denied any chest pain or palpitations.  No fever or chills.  ED Course: Upon presentation to the ER, vital signs were within normal later respiratory rate  was 21.  Labs revealed unremarkable CMP and CBC.  Influenza antigens and COVID-19 PCR came back negative.  Urine pregnancy test was negative. EKG as reviewed by me :Showed normal sinus rhythm with rate of 83. Imaging: Two-view chest x-ray showed no acute cardiopulmonary disease. -Brain MRI without contrast report is currently pending.  The patient will be admitted to an observation medical monitored bed for further evaluation and management.  11/21/20: Patient was seen and examined at her bedside.  She reports her symptoms are nearly resolved.  She had an MRI of her brain completed with no evidence of acute CVA.  2D echo, CTA head and neck results are pending.  She was assessed by PT OT with no further recommendations.  She was started on Lipitor 20 mg daily for LDL 120, goal less than 70.   Hospital Course:  Active Problems:   TIA (transient ischemic attack)  Right-sided upper and lower extremity numbness secondary to possible TIA. Acute CVA has been ruled out with MRI brain. CTA head and neck, 2D echo with bubble study results are pending. LDL 120, goal less than 70. A1c 5.3, goal less than 7.0 Started on aspirin 81 mg daily and Lipitor 20 mg daily. Follow-up with neurology and your PCP outpatient.  Degenerative findings affecting cervical spine seen on MRI. Discussed with neurosurgery Dr. Teola Bradley Will see the patient in the clinic as a follow-up As recommended Medrol Dosepak and gabapentin 300 mg nightly. TIA cannot be ruled out, continue aspirin 81 mg daily, Lipitor 20 mg nightly. Follow-up with neurology and neurosurgery.  Essential hypertension BP is currently not at  goal, Restart home Norvasc  Hypothyroidism Resume home Armour  Severe morbid obesity BMI 41 Recommend weight loss outpatient with regular physical activity and healthy dieting.   Procedures:  2D echo on 11/21/2020.  Consultations:  Neurology/stroke team.  Discharge Exam: BP (!) 154/95 (BP Location: Left  Arm)   Pulse 80   Temp 98.1 F (36.7 C) (Oral)   Resp 16   Ht 5\' 4"  (1.626 m)   Wt 110 kg   SpO2 100%   BMI 41.63 kg/m  . General: 55 y.o. year-old female well developed well nourished in no acute distress.  Alert and oriented x3. . Cardiovascular: Regular rate and rhythm with no rubs or gallops.  No thyromegaly or JVD noted.   44 Respiratory: Clear to auscultation with no wheezes or rales. Good inspiratory effort. . Abdomen: Soft nontender nondistended with normal bowel sounds x4 quadrants. . Musculoskeletal: No lower extremity edema. 2/4 pulses in all 4 extremities. . Skin: No ulcerative lesions noted or rashes, . Psychiatry: Mood is appropriate for condition and setting  Discharge Instructions You were cared for by a hospitalist during your hospital stay. If you have any questions about your discharge medications or the care you received while you were in the hospital after you are discharged, you can call the unit and asked to speak with the hospitalist on call if the hospitalist that took care of you is not available. Once you are discharged, your primary care physician will handle any further medical issues. Please note that NO REFILLS for any discharge medications will be authorized once you are discharged, as it is imperative that you return to your primary care physician (or establish a relationship with a primary care physician if you do not have one) for your aftercare needs so that they can reassess your need for medications and monitor your lab values.   Allergies as of 11/21/2020   No Known Allergies     Medication List    STOP taking these medications   chlorpheniramine-HYDROcodone 10-8 MG/5ML Suer Commonly known as: Tussionex Pennkinetic ER   fluticasone 50 MCG/ACT nasal spray Commonly known as: FLONASE   ibuprofen 800 MG tablet Commonly known as: ADVIL   omeprazole 20 MG capsule Commonly known as: PRILOSEC     TAKE these medications   amLODipine 2.5 MG  tablet Commonly known as: NORVASC Take 2.5 mg by mouth daily.   aspirin EC 81 MG tablet Take 1 tablet (81 mg total) by mouth daily. Swallow whole.   atorvastatin 20 MG tablet Commonly known as: LIPITOR Take 1 tablet (20 mg total) by mouth at bedtime. Start taking on: November 22, 2020   cyclobenzaprine 5 MG tablet Commonly known as: FLEXERIL Take 1 tablet (5 mg total) by mouth 3 (three) times daily as needed. What changed:   how much to take  reasons to take this   Euthyrox 125 MCG tablet Generic drug: levothyroxine Take 125 mcg by mouth every morning. What changed: Another medication with the same name was removed. Continue taking this medication, and follow the directions you see here.   famotidine 40 MG tablet Commonly known as: PEPCID Take 40 mg by mouth 2 (two) times daily.   gabapentin 300 MG capsule Commonly known as: NEURONTIN Take 1 capsule (300 mg total) by mouth at bedtime.   methylPREDNISolone 4 MG Tbpk tablet Commonly known as: MEDROL DOSEPAK Please follow package instructions. What changed: additional instructions      No Known Allergies  Follow-up Information    Agbor-Etang,  Arlys JohnBrian, MD. Call in 1 day(s).   Specialties: Cardiology, Radiology Why: Please call for a post hospital follow-up appointment. Contact information: [address] [place] [address] [phone]        Gordy CouncilmanBhagat, Srishti L, MD. Call in 1 day(s).   Specialty: Neurology Why: Please call for a post hospital follow-up appointment. Contact information: [address] [place] [address] [phone]        Keith RakeBarr, John C, MD. Call in 1 day(s).   Specialty: Neurosurgery Why: Please call for a post hospital follow-up appointment. Contact information: [address] [place] [address] [phone]                The results of significant diagnostics from this hospitalization (including imaging, microbiology, ancillary and laboratory) are  listed below for reference.    Significant Diagnostic Studies: CT ANGIO HEAD W OR WO CONTRAST  Result Date: 11/21/2020 CLINICAL DATA:  Code stroke follow-up for right facial numbness EXAM: CT ANGIOGRAPHY HEAD AND NECK TECHNIQUE: Multidetector CT imaging of the head and neck was performed using the standard protocol during bolus administration of intravenous contrast. Multiplanar CT image reconstructions and MIPs were obtained to evaluate the vascular anatomy. Carotid stenosis measurements (when applicable) are obtained utilizing NASCET criteria, using the distal internal carotid diameter as the denominator. CONTRAST:  75mL OMNIPAQUE IOHEXOL 350 MG/ML SOLN COMPARISON:  December 2017 FINDINGS: CTA NECK Aortic arch: Patent great vessel origins. Right carotid system: Patent. Trace calcified plaque at the common carotid bifurcation. No ICA origin stenosis. Left carotid system: Patent.  No ICA origin stenosis. Vertebral arteries: Patent and codominant.  No stenosis. Skeleton: Mild cervical spine degenerative changes. Other neck: Unremarkable. Upper chest: Included upper lungs are clear. Review of the MIP images confirms the above findings CTA HEAD Anterior circulation: Intracranial internal carotid arteries are patent with trace calcified plaque. Anterior and middle cerebral arteries are patent. Posterior circulation: Intracranial vertebral arteries, basilar artery, and posterior cerebral arteries are patent. Venous sinuses: Patent as allowed by contrast bolus timing. Review of the MIP images confirms the above findings IMPRESSION: No proximal intracranial vessel occlusion or significant stenosis. Electronically Signed   By: Guadlupe SpanishPraneil  Patel M.D.   On: 11/21/2020 10:47   CT ANGIO NECK W OR WO CONTRAST  Result Date: 11/21/2020 CLINICAL DATA:  Code stroke follow-up for right facial numbness EXAM: CT ANGIOGRAPHY HEAD AND NECK TECHNIQUE: Multidetector CT imaging of the head and neck was performed using the standard  protocol during bolus administration of intravenous contrast. Multiplanar CT image reconstructions and MIPs were obtained to evaluate the vascular anatomy. Carotid stenosis measurements (when applicable) are obtained utilizing NASCET criteria, using the distal internal carotid diameter as the denominator. CONTRAST:  75mL OMNIPAQUE IOHEXOL 350 MG/ML SOLN COMPARISON:  December 2017 FINDINGS: CTA NECK Aortic arch: Patent great vessel origins. Right carotid system: Patent. Trace calcified plaque at the common carotid bifurcation. No ICA origin stenosis. Left carotid system: Patent.  No ICA origin stenosis. Vertebral arteries: Patent and codominant.  No stenosis. Skeleton: Mild cervical spine degenerative changes. Other neck: Unremarkable. Upper chest: Included upper lungs are clear. Review of the MIP images confirms the above findings CTA HEAD Anterior circulation: Intracranial internal carotid arteries are patent with trace calcified plaque. Anterior and middle cerebral arteries are patent. Posterior circulation: Intracranial vertebral arteries, basilar artery, and posterior cerebral arteries are patent. Venous sinuses: Patent as allowed by contrast bolus timing. Review of the MIP images confirms the above findings IMPRESSION: No proximal  intracranial vessel occlusion or significant stenosis. Electronically Signed   By: Guadlupe Spanish M.D.   On: 11/21/2020 10:47   MR BRAIN WO CONTRAST  Result Date: 11/21/2020 CLINICAL DATA:  Right facial numbness EXAM: MRI HEAD WITHOUT CONTRAST TECHNIQUE: Multiplanar, multiecho pulse sequences of the brain and surrounding structures were obtained without intravenous contrast. COMPARISON:  None. FINDINGS: Brain: No acute infarct, mass effect or extra-axial collection. No acute or chronic hemorrhage. Normal white matter signal, parenchymal volume and CSF spaces. The midline structures are normal. Vascular: Major flow voids are preserved. Skull and upper cervical spine: Normal  calvarium and skull base. Visualized upper cervical spine and soft tissues are normal. Sinuses/Orbits:No paranasal sinus fluid levels or advanced mucosal thickening. No mastoid or middle ear effusion. Normal orbits. IMPRESSION: Normal brain MRI. Electronically Signed   By: Deatra Robinson M.D.   On: 11/21/2020 03:26   MR CERVICAL SPINE WO CONTRAST  Result Date: 11/21/2020 CLINICAL DATA:  Acute onset right upper and lower extremity and right facial numbness. EXAM: MRI CERVICAL SPINE WITHOUT CONTRAST TECHNIQUE: Multiplanar, multisequence MR imaging of the cervical spine was performed. No intravenous contrast was administered. COMPARISON:  None. FINDINGS: Alignment: Maintained. Vertebrae: No fracture, evidence of discitis, or bone lesion. Cord: Normal signal throughout. Posterior Fossa, vertebral arteries, paraspinal tissues: Negative. Disc levels: C2-3: Negative. C3-4: A small central disc protrusion just contacts the ventral cord. The foramina are widely patent. C4-5: Shallow broad-based disc bulge and uncovertebral spurring. The ventral thecal sac is effaced. Mild bilateral foraminal narrowing is more notable on the right. C5-6: Central disc protrusion indents the ventral cord. The foramina are open. C6-7: A right paracentral disc protrusion deforms the right hemicord. The foramina are open. C7-T1: Negative. IMPRESSION: Central disc protrusion at C5-6 indents the ventral cord. Right paracentral protrusion at C6-7 deforms the right hemicord. Small central protrusion at C3-4 contacts the cord without cord deformity. Shallow disc bulges C4-5 effaces the ventral thecal sac. Mild bilateral foraminal narrowing at this level is worse on the right. Electronically Signed   By: Drusilla Kanner M.D.   On: 11/21/2020 11:46   CT HEAD CODE STROKE WO CONTRAST  Result Date: 11/20/2020 CLINICAL DATA:  Code stroke.  Right facial numbness EXAM: CT HEAD WITHOUT CONTRAST TECHNIQUE: Contiguous axial images were obtained from the  base of the skull through the vertex without intravenous contrast. COMPARISON:  03/24/2018 FINDINGS: Brain: There is no mass, hemorrhage or extra-axial collection. The size and configuration of the ventricles and extra-axial CSF spaces are normal. The brain parenchyma is normal, without evidence of acute or chronic infarction. Vascular: No abnormal hyperdensity of the major intracranial arteries or dural venous sinuses. No intracranial atherosclerosis. Skull: The visualized skull base, calvarium and extracranial soft tissues are normal. Sinuses/Orbits: No fluid levels or advanced mucosal thickening of the visualized paranasal sinuses. No mastoid or middle ear effusion. The orbits are normal. ASPECTS [facility] Stroke Program Early CT Score) - Ganglionic level infarction (caudate, lentiform nuclei, internal capsule, insula, M1-M3 cortex): 7 - Supraganglionic infarction (M4-M6 cortex): 3 Total score (0-10 with 10 being normal): 10 IMPRESSION: 1. Normal head CT. 2. ASPECTS is 10. These results were called by telephone at the time of interpretation on 11/20/2020 at 11:16 pm to provider [facility] , who verbally acknowledged these results. Electronically Signed   By: Deatra Robinson M.D.   On: 11/20/2020 23:22    Microbiology: Recent Results (from the past 240 hour(s))  Resp Panel by RT-PCR (Flu A&B, Covid) Nasopharyngeal Swab  Status: None   Collection Time: 11/20/20 11:24 PM   Specimen: Nasopharyngeal Swab; Nasopharyngeal(NP) swabs in vial transport medium  Result Value Ref Range Status   SARS Coronavirus 2 by RT PCR NEGATIVE NEGATIVE Final    Comment: (NOTE) SARS-CoV-2 target nucleic acids are NOT DETECTED.  The SARS-CoV-2 RNA is generally detectable in upper respiratory specimens during the acute phase of infection. The lowest concentration of SARS-CoV-2 viral copies this assay can detect is 138 copies/mL. A negative result does not preclude SARS-Cov-2 infection and should not be used as the  sole basis for treatment or other patient management decisions. A negative result may occur with  improper specimen collection/handling, submission of specimen other than nasopharyngeal swab, presence of viral mutation(s) within the areas targeted by this assay, and inadequate number of viral copies(<138 copies/mL). A negative result must be combined with clinical observations, patient history, and epidemiological information. The expected result is Negative.  Fact Sheet for Patients:  BloggerCourse.com  Fact Sheet for Healthcare Providers:  SeriousBroker.it  This test is no t yet approved or cleared by the [place] FDA and  has been authorized for detection and/or diagnosis of SARS-CoV-2 by FDA under an Emergency Use Authorization (EUA). This EUA will remain  in effect (meaning this test can be used) for the duration of the COVID-19 declaration under Section 564(b)(1) of the Act, 21 U.S.C.section 360bbb-3(b)(1), unless the authorization is terminated  or revoked sooner.       Influenza A by PCR NEGATIVE NEGATIVE Final   Influenza B by PCR NEGATIVE NEGATIVE Final    Comment: (NOTE) The Xpert Xpress SARS-CoV-2/FLU/RSV plus assay is intended as an aid in the diagnosis of influenza from Nasopharyngeal swab specimens and should not be used as a sole basis for treatment. Nasal washings and aspirates are unacceptable for Xpert Xpress SARS-CoV-2/FLU/RSV testing.  Fact Sheet for Patients: BloggerCourse.com  Fact Sheet for Healthcare Providers: SeriousBroker.it  This test is not yet approved or cleared by the [place] FDA and has been authorized for detection and/or diagnosis of SARS-CoV-2 by FDA under an Emergency Use Authorization (EUA). This EUA will remain in effect (meaning this test can be used) for the duration of the COVID-19 declaration under Section 564(b)(1) of the  Act, 21 U.S.C. section 360bbb-3(b)(1), unless the authorization is terminated or revoked.  Performed at [facility] [address] [place], [place] [address]      Labs: Basic Metabolic Panel: Recent Labs  Lab 11/20/20 2324  NA 141  K 3.9  CL 104  CO2 28  GLUCOSE 113*  BUN 11  CREATININE 0.78  CALCIUM 9.2   Liver Function Tests: Recent Labs  Lab 11/20/20 2324  AST 25  ALT 39  ALKPHOS 36*  BILITOT 0.8  PROT 7.7  ALBUMIN 4.3   No results for input(s): LIPASE, AMYLASE in the last 168 hours. No results for input(s): AMMONIA in the last 168 hours. CBC: Recent Labs  Lab 11/20/20 2324  WBC 7.6  NEUTROABS 2.8  HGB 13.2  HCT 37.8  MCV 88.5  PLT 281   Cardiac Enzymes: No results for input(s): CKTOTAL, CKMB, CKMBINDEX, TROPONINI in the last 168 hours. BNP: BNP (last 3 results) No results for input(s): BNP in the last 8760 hours.  ProBNP (last 3 results) No results for input(s): PROBNP in the last 8760 hours.  CBG: Recent Labs  Lab 11/20/20 2257  GLUCAP 115*       Signed:  Darlin Drop, MD [facility] 11/21/2020, 1:26  PM

## 2020-11-21 NOTE — ED Notes (Signed)
Pt to MRI at this time.

## 2020-11-21 NOTE — Evaluation (Signed)
Occupational Therapy Evaluation Patient Details Name: Morgan Stevens MRN: [id number] DOB: Jul 12, 1966 Today's Date: 11/21/2020    History of Present Illness 55 y.o. female with medical history significant for GERD, hypothyroidism and obesity, who presented to the emergency room with acute onset of right upper and lower extremity numbness with associated right facial numbness without focal muscle weakness.  The patient denied any headache or dizziness or blurred vision.  No vertigo or tinnitus.  No urinary or stool incontinence.  No witnessed seizures.  The patient denied any chest pain or palpitations.  No fever or chills. Imaging negative for acute infarct.   Clinical Impression   Pt seen for OT evaluation this date. Prior to hospital admission, pt was independent in all aspects of ADL/IADL and mobility. No fall history in past 12 months. Pt lives with her spouse and 5 children ages 41-21 in a 1 story home with 4-5 steps to enter with R hand rail. Currently pt reporting symptoms have resolved. Pt demonstrates baseline independence to perform ADL and mobility tasks and no strength, sensory, cognitive, or visual deficits appreciated with assessment. Very mild slowed hand coordination bilaterally with thumb opposition testing, however, no functional impairments resulting and pt endorses not sleeping well previous night. No skilled OT needs identified. Will sign off. Please re-consult if additional OT needs arise.    Follow Up Recommendations  No OT follow up    Equipment Recommendations  None recommended by OT    Recommendations for Other Services       Precautions / Restrictions Precautions Precautions: Fall Precaution Comments: low fall risk Restrictions Weight Bearing Restrictions: No      Mobility Bed Mobility Overal bed mobility: Modified Independent;Independent                  Transfers Overall transfer level: Independent                    Balance Overall  balance assessment: No apparent balance deficits (not formally assessed)                                         ADL either performed or assessed with clinical judgement   ADL Overall ADL's : Independent;At baseline                                             Vision Baseline Vision/History: Wears glasses Wears Glasses: At all times Patient Visual Report: No change from baseline Vision Assessment?: No apparent visual deficits     Perception     Praxis      Pertinent Vitals/Pain Pain Assessment: No/denies pain     Hand Dominance Right   Extremity/Trunk Assessment Upper Extremity Assessment Upper Extremity Assessment: Overall WFL for tasks assessed (very mild slowed finger to thumb opposition bilaterally, pt endorses feeling sleepy, no functional impact, no sensation deficits noted)   Lower Extremity Assessment Lower Extremity Assessment: Overall WFL for tasks assessed (no sensation, coordination deficits noted)   Cervical / Trunk Assessment Cervical / Trunk Assessment: Normal   Communication Communication Communication: No difficulties   Cognition Arousal/Alertness: Awake/alert Behavior During Therapy: WFL for tasks assessed/performed Overall Cognitive Status: Within Functional Limits for tasks assessed  General Comments       Exercises     Shoulder Instructions      Home Living Family/patient expects to be discharged to:: Private residence Living Arrangements: Spouse/significant other;Children (5 children ages 60-21)   Type of Home: House Home Access: Stairs to enter Secretary/administrator of Steps: 4-5 Entrance Stairs-Rails: Right Home Layout: One level     Bathroom Shower/Tub: Producer, television/film/video: Standard     Home Equipment: None          Prior Functioning/Environment Level of Independence: Independent        Comments: Pt indep with  mobility, ADL, IADL including driving        OT Problem List:        OT Treatment/Interventions:      OT Goals(Current goals can be found in the care plan section) Acute Rehab OT Goals Patient Stated Goal: go home OT Goal Formulation: All assessment and education complete, DC therapy  OT Frequency:     Barriers to D/C:            Co-evaluation              AM-PAC OT "6 Clicks" Daily Activity     Outcome Measure Help from another person eating meals?: None Help from another person taking care of personal grooming?: None Help from another person toileting, which includes using toliet, bedpan, or urinal?: None Help from another person bathing (including washing, rinsing, drying)?: None Help from another person to put on and taking off regular upper body clothing?: None Help from another person to put on and taking off regular lower body clothing?: None 6 Click Score: 24   End of Session    Activity Tolerance: Patient tolerated treatment well Patient left: in bed;with call bell/phone within reach;Other (comment) (seated EOB with PT for assessment)  OT Visit Diagnosis: Hemiplegia and hemiparesis Hemiplegia - Right/Left: Right Hemiplegia - dominant/non-dominant: Dominant Hemiplegia - caused by: Unspecified                Time: 4401-0272 OT Time Calculation (min): 9 min Charges:  OT General Charges $OT Visit: 1 Visit OT Evaluation $OT Eval Low Complexity: 1 Low  Wynona Canes, MPH, MS, OTR/L ascom [phone] 11/21/20, 9:10 AM

## 2020-11-21 NOTE — H&P (Addendum)
[place]   PATIENT NAME: Aubriegh Minch    MR#:  [id number]  DATE OF BIRTH:  Rodell Marrs 06, 1967  DATE OF ADMISSION:  11/20/2020  PRIMARY CARE PHYSICIAN: Patient, No Pcp Per   Patient is coming from: Home.  REQUESTING/REFERRING PHYSICIAN: Nita Sickle, MD  CHIEF COMPLAINT:   Chief Complaint  Patient presents with  . Code Stroke    HISTORY OF PRESENT ILLNESS:  Avanni Turnbaugh is a 55 y.o. female with medical history significant for GERD, hypothyroidism and obesity, who presented to the emergency room with acute onset of right upper and lower extremity numbness with associated right facial numbness without focal muscle weakness.  The patient denied any headache or dizziness or blurred vision.  No vertigo or tinnitus.  No urinary or stool incontinence.  No witnessed seizures.  The patient denied any chest pain or palpitations.  No fever or chills.  ED Course: Upon presentation to the ER, vital signs were within normal later respiratory rate was 21.  Labs revealed unremarkable CMP and CBC.  Influenza antigens and COVID-19 PCR came back negative.  Urine pregnancy test was negative. EKG as reviewed by me :Showed normal sinus rhythm with rate of 83. Imaging: Two-view chest x-ray showed no acute cardiopulmonary disease. -Brain MRI without contrast report is currently pending.  The patient will be admitted to an observation medical monitored bed for further evaluation and management. PAST MEDICAL HISTORY:   Past Medical History:  Diagnosis Date  . Gastritis   . Hiatal hernia   . Obesity (BMI 30-39.9)   . Reflux   . Thyroid disease     PAST SURGICAL HISTORY:   Past Surgical History:  Procedure Laterality Date  . CARDIAC CATHETERIZATION  02/27/2016   Procedure: Left Heart Cath and Coronary Angiography;  Surgeon: Corky Crafts, MD;  Location: [facility] INVASIVE CV LAB;  Service: Cardiovascular;;  . CESAREAN SECTION    . CHOLECYSTECTOMY    . COLONOSCOPY WITH PROPOFOL N/A 04/15/2019    Procedure: COLONOSCOPY WITH PROPOFOL;  Surgeon: Pasty Spillers, MD;  Location: [facility] ENDOSCOPY;  Service: Endoscopy;  Laterality: N/A;  . ESOPHAGOGASTRODUODENOSCOPY (EGD) WITH PROPOFOL N/A 04/15/2019   Procedure: ESOPHAGOGASTRODUODENOSCOPY (EGD) WITH PROPOFOL;  Surgeon: Pasty Spillers, MD;  Location: [facility] ENDOSCOPY;  Service: Endoscopy;  Laterality: N/A;  . THYROIDECTOMY    . THYROIDECTOMY      SOCIAL HISTORY:   Social History   Tobacco Use  . Smoking status: Never Smoker  . Smokeless tobacco: Never Used  Substance Use Topics  . Alcohol use: No    Alcohol/week: 0.0 standard drinks    FAMILY HISTORY:   Family History  Problem Relation Age of Onset  . Breast cancer Neg Hx     DRUG ALLERGIES:  No Known Allergies  REVIEW OF SYSTEMS:   ROS As per history of present illness. All pertinent systems were reviewed above. Constitutional, HEENT, cardiovascular, respiratory, GI, GU, musculoskeletal, neuro, psychiatric, endocrine, integumentary and hematologic systems were reviewed and are otherwise negative/unremarkable except for positive findings mentioned above in the HPI.   MEDICATIONS AT HOME:   Prior to Admission medications   Medication Sig Start Date End Date Taking? Authorizing Provider  chlorpheniramine-HYDROcodone (TUSSIONEX PENNKINETIC ER) 10-8 MG/5ML SUER Take 5 mLs by mouth 2 (two) times daily. 08/04/20   Joni Reining, PA-C  cyclobenzaprine (FLEXERIL) 5 MG tablet Take 1 tablet (5 mg total) by mouth 3 (three) times daily as needed. Patient taking differently: Take 5 mg by mouth 3 (three)  times daily as needed for muscle spasms.  11/15/19   Menshew, Charlesetta Ivory, PA-C  fluticasone (FLONASE) 50 MCG/ACT nasal spray Place 2 sprays into both nostrils daily. 09/14/19 09/13/20  [provider]  levothyroxine (SYNTHROID, LEVOTHROID) 137 MCG tablet Take 137 mcg by mouth daily before breakfast.    [provider]  methylPREDNISolone (MEDROL DOSEPAK) 4  MG TBPK tablet Take Tapered dose as directed 08/04/20   Joni Reining, PA-C  omeprazole (PRILOSEC) 20 MG capsule Take 20 mg by mouth 2 (two) times daily before a meal.    [provider]      VITAL SIGNS:  Blood pressure (!) 147/88, pulse 79, temperature 98.1 F (36.7 C), temperature source Oral, resp. rate 17, SpO2 100 %.  PHYSICAL EXAMINATION:  Physical Exam  GENERAL:  55 y.o.-year-old female patient lying in the bed with no acute distress.  EYES: Pupils equal, round, reactive to light and accommodation. No scleral icterus. Extraocular muscles intact.  HEENT: Head atraumatic, normocephalic. Oropharynx and nasopharynx clear.  NECK:  Supple, no jugular venous distention. No thyroid enlargement, no tenderness.  LUNGS: Normal breath sounds bilaterally, no wheezing, rales,rhonchi or crepitation. No use of accessory muscles of respiration.  CARDIOVASCULAR: Regular rate and rhythm, S1, S2 normal. No murmurs, rubs, or gallops.  ABDOMEN: Soft, nondistended, nontender. Bowel sounds present. No organomegaly or mass.  EXTREMITIES: No pedal edema, cyanosis, or clubbing.  NEUROLOGIC: Cranial nerves II through XII are intact. Muscle strength 5/5 in all extremities. Sensation diminished to light touch on both right upper and right lower extremity as well as her right side of the face.  Gait not checked.  PSYCHIATRIC: The patient is alert and oriented x 3.  Normal affect and good eye contact. SKIN: No obvious rash, lesion, or ulcer.   LABORATORY PANEL:   CBC Recent Labs  Lab 11/20/20 2324  WBC 7.6  HGB 13.2  HCT 37.8  PLT 281   ------------------------------------------------------------------------------------------------------------------  Chemistries  Recent Labs  Lab 11/20/20 2324  NA 141  K 3.9  CL 104  CO2 28  GLUCOSE 113*  BUN 11  CREATININE 0.78  CALCIUM 9.2  AST 25  ALT 39  ALKPHOS 36*  BILITOT 0.8    ------------------------------------------------------------------------------------------------------------------  Cardiac Enzymes No results for input(s): TROPONINI in the last 168 hours. ------------------------------------------------------------------------------------------------------------------  RADIOLOGY:  CT HEAD CODE STROKE WO CONTRAST  Result Date: 11/20/2020 CLINICAL DATA:  Code stroke.  Right facial numbness EXAM: CT HEAD WITHOUT CONTRAST TECHNIQUE: Contiguous axial images were obtained from the base of the skull through the vertex without intravenous contrast. COMPARISON:  03/24/2018 FINDINGS: Brain: There is no mass, hemorrhage or extra-axial collection. The size and configuration of the ventricles and extra-axial CSF spaces are normal. The brain parenchyma is normal, without evidence of acute or chronic infarction. Vascular: No abnormal hyperdensity of the major intracranial arteries or dural venous sinuses. No intracranial atherosclerosis. Skull: The visualized skull base, calvarium and extracranial soft tissues are normal. Sinuses/Orbits: No fluid levels or advanced mucosal thickening of the visualized paranasal sinuses. No mastoid or middle ear effusion. The orbits are normal. ASPECTS [facility] Stroke Program Early CT Score) - Ganglionic level infarction (caudate, lentiform nuclei, internal capsule, insula, M1-M3 cortex): 7 - Supraganglionic infarction (M4-M6 cortex): 3 Total score (0-10 with 10 being normal): 10 IMPRESSION: 1. Normal head CT. 2. ASPECTS is 10. These results were called by telephone at the time of interpretation on 11/20/2020 at 11:16 pm to provider [facility] , who verbally acknowledged these results.  Electronically Signed   By: Deatra Robinson M.D.   On: 11/20/2020 23:22      IMPRESSION AND PLAN:  Active Problems:   TIA (transient ischemic attack)  1.  Right-sided upper and lower extremity numbness, possibly TIA, rule out evolving CVA. -The patient  will be admitted to an observation medical monitored bed. -We will follow neuro checks every 4 hours for 24 hours. -Patient will be placed on Aspirin and statin therapy. -We will check fasting lipids in a.m. -We will follow brain MRI results. -We will obtain a 2D echo with bubble study for further assessment. -Neurology consult will be obtained as well as PT/OT and ST consults. -I notified Dr. Amada Jupiter about the patient.  2.  Essential hypertension. -We will continue Norvasc with permissive parameters.  3.  Hypothyroidism. -We will continue Synthroid and check TSH level.  4.  GERD. -We will continue PPI therapy.  DVT prophylaxis: Lovenox. Code Status: full code.   Family Communication:  The plan of care was discussed in details with the patient (and family). I answered all questions. The patient agreed to proceed with the above mentioned plan. Further management will depend upon hospital course. Disposition Plan: Back to previous home environment Consults called: Neurology consult. All the records are reviewed and case discussed with ED provider.  Status is: Observation  The patient remains OBS appropriate and will d/c before 2 midnights.  Dispo: The patient is from: Home              Anticipated d/c is to: Home              Patient currently is not medically stable to d/c.   Difficult to place patient No  TOTAL TIME TAKING CARE OF THIS PATIENT: 55 minutes.    Hannah Beat M.D on 11/21/2020 at 12:26 AM  [facility]   From 7 PM-7 AM, contact night-coverage [email or web address]  CC: Primary care physician; Patient, No Pcp Per

## 2020-11-21 NOTE — ED Notes (Signed)
Pt ambulatory with steady gait, unassisted, to use restroom. Denies any headache/dizziness.

## 2020-11-21 NOTE — ED Notes (Signed)
IV catheter removed intact without complication.  D/C instructions given.  RX and follow up discussed.  All questions addressed.  Understanding verbalized.  Pt left ER via w/c with family.

## 2020-11-21 NOTE — ED Notes (Signed)
Attempted to visit patient on routine admission rounds for leaders. Patient with staff at present.

## 2020-11-21 NOTE — Progress Notes (Signed)
PHARMACIST - PHYSICIAN COMMUNICATION  CONCERNING:  Enoxaparin (Lovenox) for DVT Prophylaxis    RECOMMENDATION: Patient was prescribed enoxaprin 40mg  q24 hours for VTE prophylaxis.   Filed Weights   11/20/20 2254  Weight: 110 kg (242 lb 8.1 oz)    Body mass index is 41.63 kg/m.  Estimated Creatinine Clearance: 99.7 mL/min (by C-G formula based on SCr of 0.78 mg/dL).   Based on [facility] policy patient is candidate for enoxaparin 0.5mg /kg TBW SQ every 24 hours based on BMI being >30.  Patient is candidate for enoxaparin 30mg  every 24 hours based on CrCl <16ml/min or Weight <45kg  DESCRIPTION: Pharmacy has adjusted enoxaparin dose per [facility] policy.  Patient is now receiving enoxaparin 0.5 mg/kg every 24 hours   31m, PharmD, [facility] 11/21/2020 2:16 AM

## 2020-11-22 ENCOUNTER — Telehealth: Payer: Self-pay

## 2020-11-22 NOTE — Telephone Encounter (Signed)
Transition Care Management Unsuccessful Follow-up Telephone Call  Date of discharge and from where:  11/18/2020 from [facility]   Attempts:  1st Attempt  Reason for unsuccessful TCM follow-up call:  Left voice message

## 2020-11-23 NOTE — Telephone Encounter (Signed)
Transition Care Management Follow-up Telephone Call  Date of discharge and from where: 11/21/2020 from [facility]  How have you been since you were released from the hospital? Pt stated that she is feeling fine and has no questions or concerns regarding visit.   Any questions or concerns? No  Items Reviewed:  Did the pt receive and understand the discharge instructions provided? Yes   Medications obtained and verified? Yes   Other? No   Any new allergies since your discharge? No   Dietary orders reviewed? Heart healthy  Do you have support at home? Yes   Functional Questionnaire: (I = Independent and D = Dependent) ADLs: I  Bathing/Dressing- I  Meal Prep- I  Eating- I  Maintaining continence- I  Transferring/Ambulation- I  Managing Meds- I   Follow up appointments reviewed:   PCP Hospital f/u appt confirmed? No    Specialist Hospital f/u appt confirmed? No    Are transportation arrangements needed? No   If their condition worsens, is the pt aware to call PCP or go to the Emergency Dept.? Yes  Was the patient provided with contact information for the PCP's office or ED? Yes  Was to pt encouraged to call back with questions or concerns? Yes

## 2020-12-08 DIAGNOSIS — M50322 Other cervical disc degeneration at C5-C6 level: Secondary | ICD-10-CM | POA: Diagnosis not present

## 2020-12-08 DIAGNOSIS — M50321 Other cervical disc degeneration at C4-C5 level: Secondary | ICD-10-CM | POA: Diagnosis not present

## 2020-12-08 DIAGNOSIS — M50823 Other cervical disc disorders at C6-C7 level: Secondary | ICD-10-CM | POA: Diagnosis not present

## 2021-02-02 ENCOUNTER — Ambulatory Visit: Payer: Medicaid Other | Admitting: Family Medicine

## 2021-02-02 ENCOUNTER — Encounter: Payer: Self-pay | Admitting: Family Medicine

## 2021-02-02 ENCOUNTER — Other Ambulatory Visit: Payer: Self-pay

## 2021-02-02 VITALS — BP 136/89 | HR 77 | Ht 64.0 in | Wt 236.8 lb

## 2021-02-02 DIAGNOSIS — E669 Obesity, unspecified: Secondary | ICD-10-CM

## 2021-02-02 DIAGNOSIS — Z7689 Persons encountering health services in other specified circumstances: Secondary | ICD-10-CM | POA: Diagnosis not present

## 2021-02-02 NOTE — Assessment & Plan Note (Addendum)
Patient in today to establish care from a provider in [place]. She is changing to this practice because it is much closer to her home.  Her clinical problems include: Allergic Rhinitis, Previous TIA, Hyperlipidemia, Hypothyroid post Thyroidectomy, HTN, Chronic Cervical Degenerative Disease seen on previous MRI. She takes all meds as rx. She takes extra strength tylenol for pain.

## 2021-02-02 NOTE — Progress Notes (Signed)
Established Patient Office Visit  SUBJECTIVE:  Subjective  Patient ID: Morgan Stevens, female    DOB: 1966-08-13  Age: 55 y.o. MRN: [id number]  CC:  Chief Complaint  Patient presents with   New Patient (Initial Visit)    HPI Morgan Stevens is a 55 y.o. female presenting today for     Past Medical History:  Diagnosis Date   Gastritis    Hiatal hernia    Obesity (BMI 30-39.9)    Reflux    Thyroid disease     Past Surgical History:  Procedure Laterality Date   CARDIAC CATHETERIZATION  02/27/2016   Procedure: Left Heart Cath and Coronary Angiography;  Surgeon: Corky Crafts, MD;  Location: [facility] INVASIVE CV LAB;  Service: Cardiovascular;;   CESAREAN SECTION     CHOLECYSTECTOMY     COLONOSCOPY WITH PROPOFOL N/A 04/15/2019   Procedure: COLONOSCOPY WITH PROPOFOL;  Surgeon: Pasty Spillers, MD;  Location: [facility] ENDOSCOPY;  Service: Endoscopy;  Laterality: N/A;   ESOPHAGOGASTRODUODENOSCOPY (EGD) WITH PROPOFOL N/A 04/15/2019   Procedure: ESOPHAGOGASTRODUODENOSCOPY (EGD) WITH PROPOFOL;  Surgeon: Pasty Spillers, MD;  Location: [facility] ENDOSCOPY;  Service: Endoscopy;  Laterality: N/A;   THYROIDECTOMY     THYROIDECTOMY      Family History  Problem Relation Age of Onset   Breast cancer Neg Hx     Social History   Socioeconomic History   Marital status: Married    Spouse name: Not on file   Number of children: Not on file   Years of education: Not on file   Highest education level: Not on file  Occupational History   Occupation: home maker  Tobacco Use   Smoking status: Never   Smokeless tobacco: Never  Vaping Use   Vaping Use: Never used  Substance and Sexual Activity   Alcohol use: No    Alcohol/week: 0.0 standard drinks   Drug use: No   Sexual activity: Not on file  Other Topics Concern   Not on file  Social History Narrative   Home maker, 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, husband is a window Midwife   Social Determinants of Corporate investment banker Strain: Not  on file  Food Insecurity: Not on file  Transportation Needs: Not on file  Physical Activity: Not on file  Stress: Not on file  Social Connections: Not on file  Intimate Partner Violence: Not on file     Current Outpatient Medications:    amLODipine (NORVASC) 2.5 MG tablet, Take 2.5 mg by mouth daily., Disp: , Rfl:    aspirin EC 81 MG tablet, Take 1 tablet (81 mg total) by mouth daily. Swallow whole., Disp: 90 tablet, Rfl: 0   atorvastatin (LIPITOR) 20 MG tablet, Take 1 tablet (20 mg total) by mouth at bedtime., Disp: 90 tablet, Rfl: 0   cyclobenzaprine (FLEXERIL) 5 MG tablet, Take 1 tablet (5 mg total) by mouth 3 (three) times daily as needed. (Patient taking differently: Take 10 mg by mouth 3 (three) times daily as needed for muscle spasms.), Disp: 15 tablet, Rfl: 0   EUTHYROX 125 MCG tablet, Take 125 mcg by mouth every morning., Disp: , Rfl:    famotidine (PEPCID) 40 MG tablet, Take 40 mg by mouth 2 (two) times daily., Disp: , Rfl:    No Known Allergies  ROS Review of Systems  Constitutional: Negative.   HENT: Negative.    Respiratory: Negative.    Cardiovascular: Negative.   Genitourinary: Negative.   Psychiatric/Behavioral: Negative.  OBJECTIVE:    Physical Exam Vitals and nursing note reviewed.  Constitutional:      Appearance: Normal appearance. She is obese.  HENT:     Right Ear: Tympanic membrane normal.     Left Ear: Tympanic membrane normal.  Cardiovascular:     Rate and Rhythm: Normal rate and regular rhythm.  Musculoskeletal:     Cervical back: Normal range of motion.  Skin:    General: Skin is warm.  Psychiatric:        Mood and Affect: Mood normal.    BP 136/89   Pulse 77   Ht 5\' 4"  (1.626 m)   Wt 236 lb 12.8 oz (107.4 kg)   BMI 40.65 kg/m  Wt Readings from Last 3 Encounters:  02/02/21 236 lb 12.8 oz (107.4 kg)  11/20/20 242 lb 8.1 oz (110 kg)  10/14/20 228 lb (103.4 kg)    Health Maintenance Due  Topic Date Due   Hepatitis C Screening   Never done   PAP SMEAR-Modifier  Never done   COVID-19 Vaccine (3 - Booster for Pfizer series) 05/03/2020   MAMMOGRAM  06/30/2020    There are no preventive care reminders to display for this patient.  CBC Latest Ref Rng & Units 11/20/2020 10/14/2020 11/22/2019  WBC 4.0 - 10.5 K/uL 7.6 5.6 5.1  Hemoglobin 12.0 - 15.0 g/dL 11/24/2019 35.0 09.3  Hematocrit 36.0 - 46.0 % 37.8 38.3 37.6  Platelets 150 - 400 K/uL 281 330 310   CMP Latest Ref Rng & Units 11/20/2020 10/14/2020 11/22/2019  Glucose 70 - 99 mg/dL 11/24/2019) 299(B) 716(R)  BUN 6 - 20 mg/dL 11 8 16   Creatinine 0.44 - 1.00 mg/dL 678(L 3.81  Sodium 135 - 145 mmol/L 141 139 140  Potassium 3.5 - 5.1 mmol/L 3.9 3.6 3.5  Chloride 98 - 111 mmol/L 104 104 106  CO2 22 - 32 mmol/L 28 25 23   Calcium 8.9 - 10.3 mg/dL 9.2 9.0 9.4  Total Protein 6.5 - 8.1 g/dL 7.7 - -  Total Bilirubin 0.3 - 1.2 mg/dL 0.8 - -  Alkaline Phos 38 - 126 U/L 36(L) - -  AST 15 - 41 U/L 25 - -  ALT 0 - 44 U/L 39 - -    Lab Results  Component Value Date   TSH 0.435 02/25/2016   Lab Results  Component Value Date   ALBUMIN 4.3 11/20/2020   ANIONGAP 9 11/20/2020   Lab Results  Component Value Date   CHOL 187 11/21/2020   CHOL 153 02/26/2016   CHOL 160 09/06/2015   HDL 44 11/21/2020   HDL 32 (L) 02/26/2016   HDL 41 09/06/2015   LDLCALC 120 (H) 11/21/2020   LDLCALC 101 (H) 02/26/2016   LDLCALC 98 09/06/2015   CHOLHDL 4.3 11/21/2020   CHOLHDL 4.8 02/26/2016   CHOLHDL 3.9 09/06/2015   Lab Results  Component Value Date   TRIG 117 11/21/2020   Lab Results  Component Value Date   HGBA1C 5.3 11/21/2020   HGBA1C 5.2 02/25/2016      ASSESSMENT & PLAN:   Problem List Items Addressed This Visit       Other   Obesity (BMI 30-39.9) (Chronic)    Is not following a specific diet currently, I have given her information on the Mediterranean diet today.        Encounter to establish care - Primary    Patient in today to establish care from a provider in  11/23/2020. She is changing to  this practice because it is much closer to her home.  Her clinical problems include: Allergic Rhinitis, Previous TIA, Hyperlipidemia, Hypothyroid post Thyroidectomy, HTN, Chronic Cervical Degenerative Disease seen on previous MRI. She takes all meds as rx. She takes extra strength tylenol for pain.         No orders of the defined types were placed in this encounter.     Follow-up: No follow-ups on file.    Irish Lack, FNP [facility] [address], [place], [place] [address]

## 2021-02-02 NOTE — Assessment & Plan Note (Signed)
Is not following a specific diet currently, I have given her information on the Mediterranean diet today.

## 2021-02-03 ENCOUNTER — Other Ambulatory Visit: Payer: Self-pay | Admitting: *Deleted

## 2021-02-03 MED ORDER — ATORVASTATIN CALCIUM 20 MG PO TABS: 20.0000 mg | ORAL_TABLET | Freq: Every day | ORAL | 0 refills | Status: DC

## 2021-02-03 MED ORDER — ATORVASTATIN CALCIUM 20 MG PO TABS: 20.0000 mg | ORAL_TABLET | Freq: Every day | ORAL | 3 refills | Status: DC

## 2021-02-05 IMAGING — CR DG SHOULDER 2+V*L*
3 series · 3 of 3 positions shown · non-contrast
Comparison: [DATE].

CLINICAL DATA: Left shoulder pain for 3 months without known
injury.

EXAM:
LEFT SHOULDER - 2+ VIEW

[shoulder grashey]
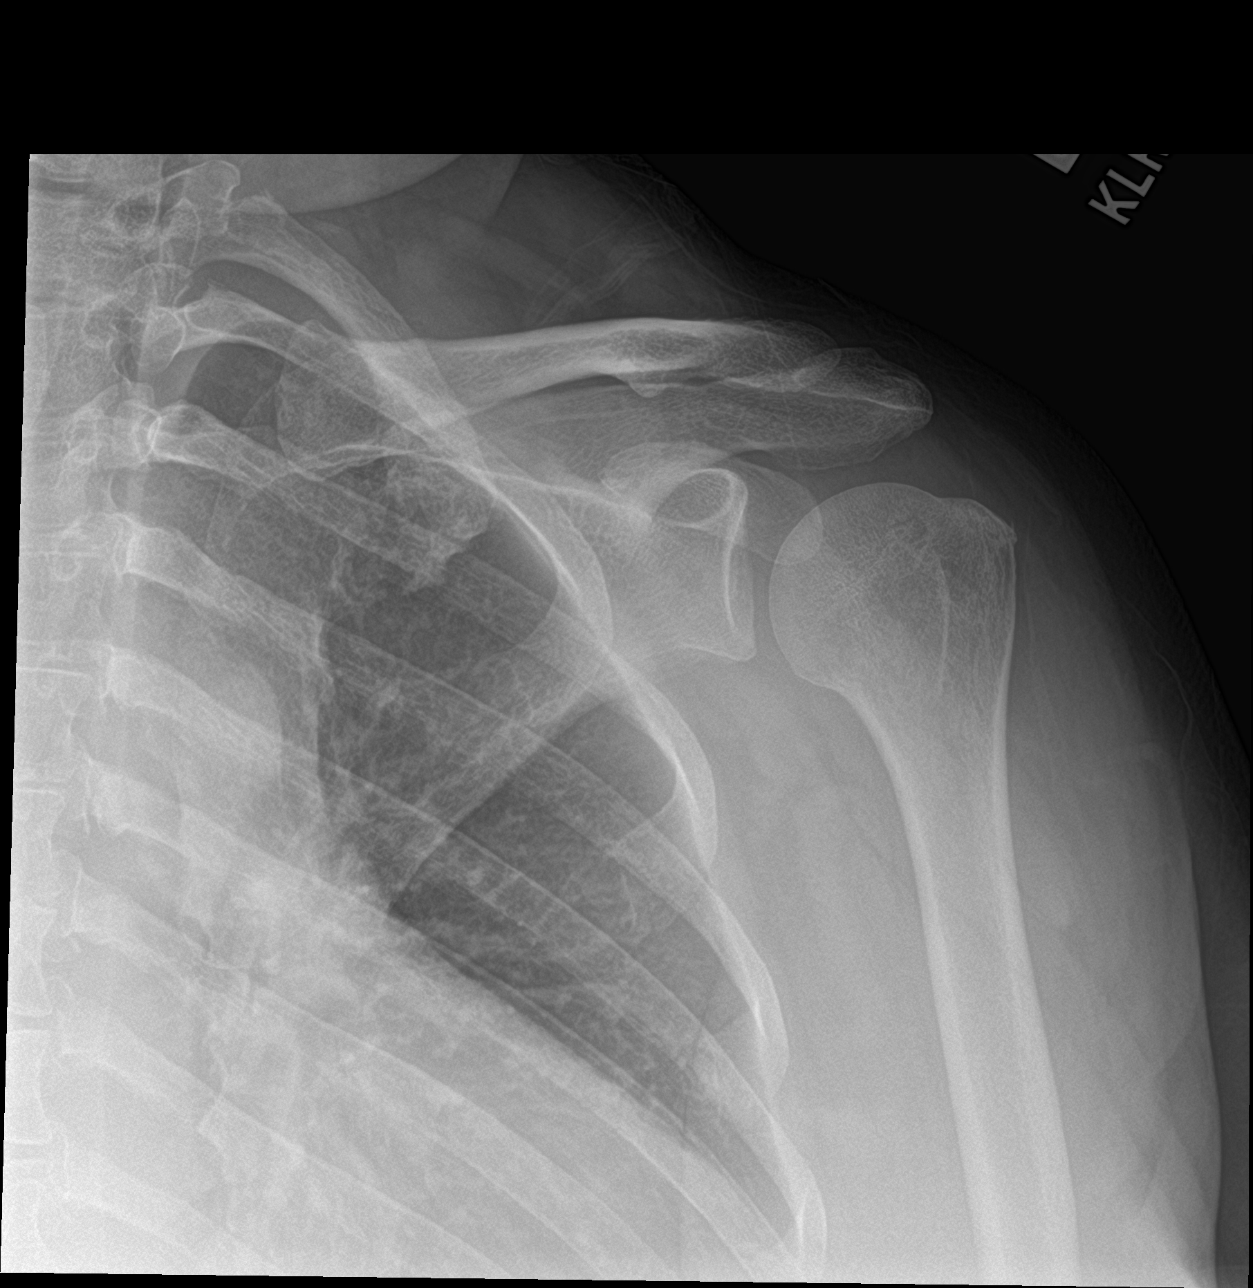

[shoulder y view]
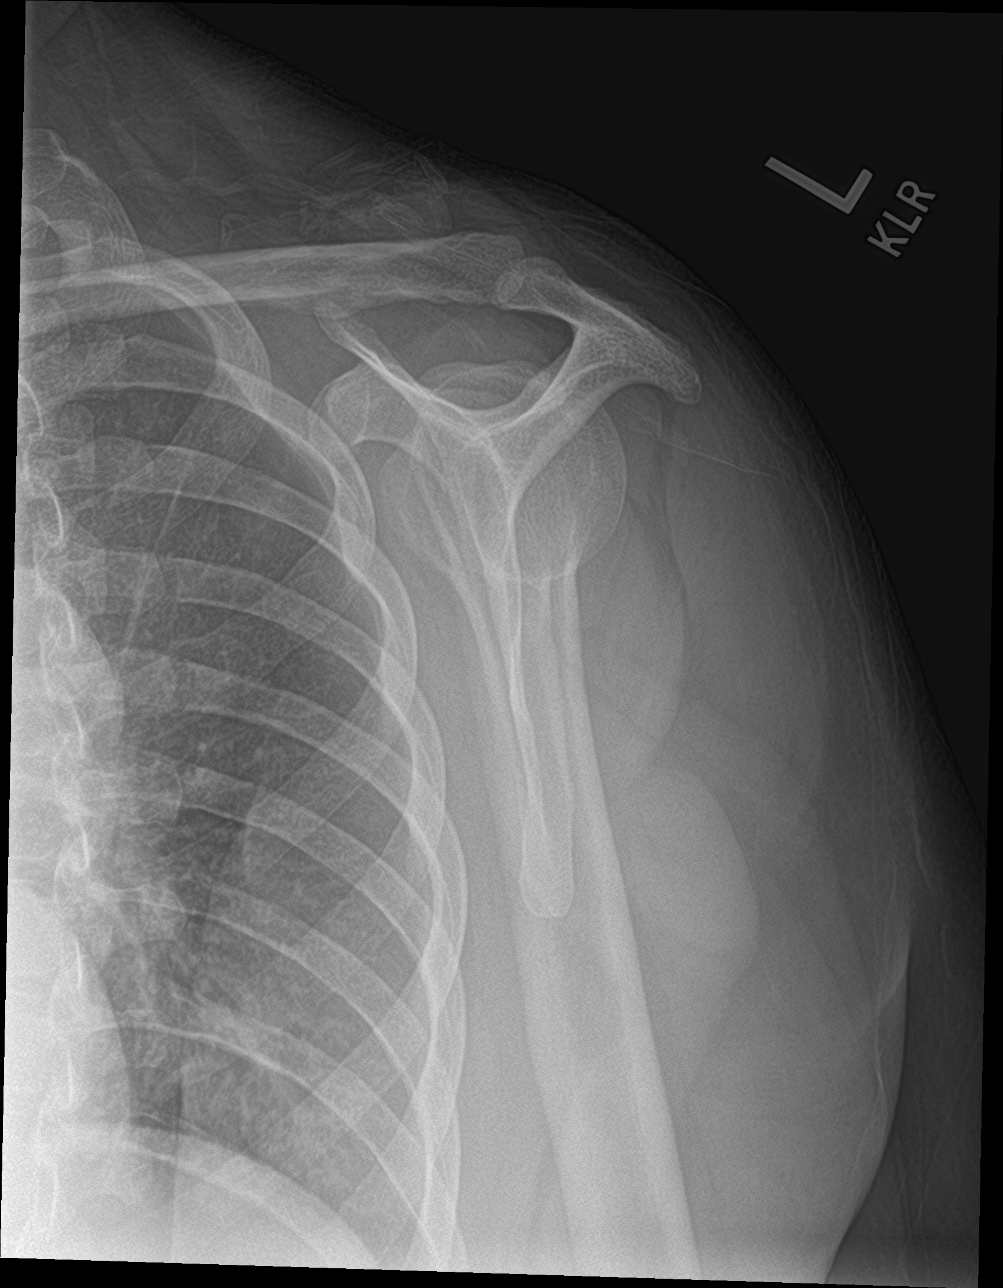

[shoulder axillary]
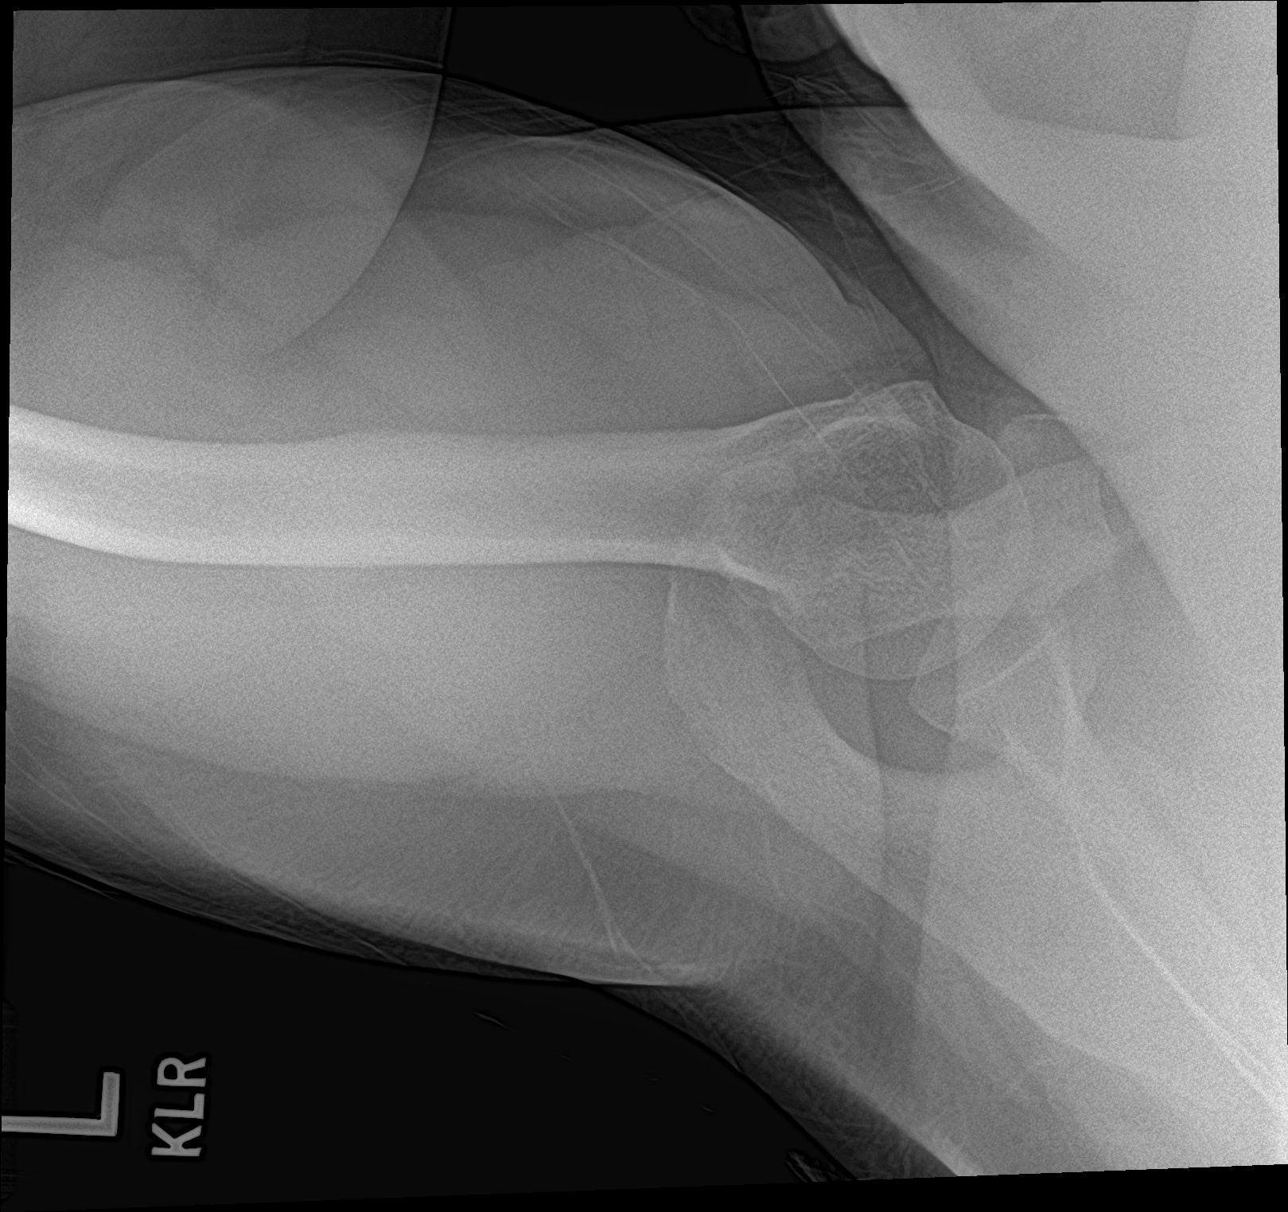

[3 of 3 positions shown; findings below may reference images not displayed]

FINDINGS: There is no evidence of fracture or dislocation. There is no
evidence of arthropathy or other focal bone abnormality. Soft
tissues are unremarkable.
IMPRESSION: Negative.

## 2021-02-17 ENCOUNTER — Other Ambulatory Visit: Payer: Self-pay | Admitting: *Deleted

## 2021-02-17 MED ORDER — ATORVASTATIN CALCIUM 20 MG PO TABS: 20.0000 mg | ORAL_TABLET | Freq: Every day | ORAL | 3 refills | Status: DC

## 2021-02-26 ENCOUNTER — Encounter: Payer: Self-pay | Admitting: Family Medicine

## 2021-03-01 ENCOUNTER — Other Ambulatory Visit: Payer: Self-pay

## 2021-03-01 MED ORDER — AMLODIPINE BESYLATE 2.5 MG PO TABS: 2.5000 mg | ORAL_TABLET | Freq: Every day | ORAL | 1 refills | Status: DC

## 2021-03-16 ENCOUNTER — Encounter: Payer: Self-pay | Admitting: Family Medicine

## 2021-03-16 ENCOUNTER — Ambulatory Visit: Payer: Medicaid Other | Admitting: Family Medicine

## 2021-03-16 ENCOUNTER — Other Ambulatory Visit: Payer: Self-pay

## 2021-03-16 VITALS — BP 152/85 | HR 87 | Ht 64.0 in | Wt 222.2 lb

## 2021-03-16 DIAGNOSIS — J0110 Acute frontal sinusitis, unspecified: Secondary | ICD-10-CM | POA: Diagnosis not present

## 2021-03-16 MED ORDER — AMOXICILLIN-POT CLAVULANATE 875-125 MG PO TABS: 1.0000 | ORAL_TABLET | Freq: Two times a day (BID) | ORAL | 0 refills | Status: DC

## 2021-03-16 NOTE — Progress Notes (Signed)
Established Patient Office Visit  SUBJECTIVE:  Subjective  Patient ID: Morgan Stevens, female    DOB: Dec 06, 1965  Age: 55 y.o. MRN: [id number]  CC:  Chief Complaint  Patient presents with   Sinus Problem    Patient complains of congestion, ear pain in both ears, sinus pressure and coughing. Sxs started 2 weeks ago. Patient did at home covid test and was negative.    HPI Morgan Stevens is a 55 y.o. female presenting today for     Past Medical History:  Diagnosis Date   Gastritis    Hiatal hernia    Obesity (BMI 30-39.9)    Reflux    Thyroid disease     Past Surgical History:  Procedure Laterality Date   CARDIAC CATHETERIZATION  02/27/2016   Procedure: Left Heart Cath and Coronary Angiography;  Surgeon: Corky Crafts, MD;  Location: [facility] INVASIVE CV LAB;  Service: Cardiovascular;;   CESAREAN SECTION     CHOLECYSTECTOMY     COLONOSCOPY WITH PROPOFOL N/A 04/15/2019   Procedure: COLONOSCOPY WITH PROPOFOL;  Surgeon: Pasty Spillers, MD;  Location: [facility] ENDOSCOPY;  Service: Endoscopy;  Laterality: N/A;   ESOPHAGOGASTRODUODENOSCOPY (EGD) WITH PROPOFOL N/A 04/15/2019   Procedure: ESOPHAGOGASTRODUODENOSCOPY (EGD) WITH PROPOFOL;  Surgeon: Pasty Spillers, MD;  Location: [facility] ENDOSCOPY;  Service: Endoscopy;  Laterality: N/A;   THYROIDECTOMY     THYROIDECTOMY      Family History  Problem Relation Age of Onset   Breast cancer Neg Hx     Social History   Socioeconomic History   Marital status: Married    Spouse name: Not on file   Number of children: Not on file   Years of education: Not on file   Highest education level: Not on file  Occupational History   Occupation: home maker  Tobacco Use   Smoking status: Never   Smokeless tobacco: Never  Vaping Use   Vaping Use: Never used  Substance and Sexual Activity   Alcohol use: No    Alcohol/week: 0.0 standard drinks   Drug use: No   Sexual activity: Not on file  Other Topics Concern   Not on file  Social  History Narrative   Home maker, 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, husband is a window Midwife   Social Determinants of Corporate investment banker Strain: Not on file  Food Insecurity: Not on file  Transportation Needs: Not on file  Physical Activity: Not on file  Stress: Not on file  Social Connections: Not on file  Intimate Partner Violence: Not on file     Current Outpatient Medications:    amLODipine (NORVASC) 2.5 MG tablet, Take 1 tablet (2.5 mg total) by mouth daily., Disp: 90 tablet, Rfl: 1   amoxicillin-clavulanate (AUGMENTIN) 875-125 MG tablet, Take 1 tablet by mouth 2 (two) times daily., Disp: 20 tablet, Rfl: 0   atorvastatin (LIPITOR) 20 MG tablet, Take 1 tablet (20 mg total) by mouth at bedtime., Disp: 90 tablet, Rfl: 3   cyclobenzaprine (FLEXERIL) 5 MG tablet, Take 1 tablet (5 mg total) by mouth 3 (three) times daily as needed. (Patient taking differently: Take 10 mg by mouth 3 (three) times daily as needed for muscle spasms.), Disp: 15 tablet, Rfl: 0   EUTHYROX 125 MCG tablet, Take 125 mcg by mouth every morning., Disp: , Rfl:    famotidine (PEPCID) 40 MG tablet, Take 40 mg by mouth 2 (two) times daily., Disp: , Rfl:    No Known Allergies  ROS Review of Systems  Constitutional: Negative.   HENT:  Positive for congestion, hearing loss, sinus pressure, sinus pain and sneezing.   Respiratory: Negative.    Cardiovascular: Negative.   Genitourinary: Negative.   Neurological: Negative.   Psychiatric/Behavioral: Negative.      OBJECTIVE:    Physical Exam HENT:     Head: Normocephalic.     Right Ear: Tympanic membrane normal.     Left Ear: Tympanic membrane normal.  Cardiovascular:     Rate and Rhythm: Normal rate and regular rhythm.  Pulmonary:     Effort: Pulmonary effort is normal.  Musculoskeletal:        General: Normal range of motion.  Skin:    General: Skin is warm.  Neurological:     Mental Status: She is alert.    BP (!) 152/85   Pulse 87   Ht 5\' 4"  (1.626  m)   Wt 222 lb 3.2 oz (100.8 kg)   SpO2 99%   BMI 38.14 kg/m  Wt Readings from Last 3 Encounters:  03/16/21 222 lb 3.2 oz (100.8 kg)  02/02/21 236 lb 12.8 oz (107.4 kg)  11/20/20 242 lb 8.1 oz (110 kg)    Health Maintenance Due  Topic Date Due   Hepatitis C Screening  Never done   PAP SMEAR-Modifier  Never done   COVID-19 Vaccine (3 - Booster for Pfizer series) 05/03/2020   MAMMOGRAM  06/30/2020    There are no preventive care reminders to display for this patient.  CBC Latest Ref Rng & Units 11/20/2020 10/14/2020 11/22/2019  WBC 4.0 - 10.5 K/uL 7.6 5.6 5.1  Hemoglobin 12.0 - 15.0 g/dL 11/24/2019 48.1 85.6  Hematocrit 36.0 - 46.0 % 37.8 38.3 37.6  Platelets 150 - 400 K/uL 281 330 310   CMP Latest Ref Rng & Units 11/20/2020 10/14/2020 11/22/2019  Glucose 70 - 99 mg/dL 11/24/2019) 970(Y) 637(C)  BUN 6 - 20 mg/dL 11 8 16   Creatinine 0.44 - 1.00 mg/dL 588(F 0.27  Sodium 135 - 145 mmol/L 141 139 140  Potassium 3.5 - 5.1 mmol/L 3.9 3.6 3.5  Chloride 98 - 111 mmol/L 104 104 106  CO2 22 - 32 mmol/L 28 25 23   Calcium 8.9 - 10.3 mg/dL 9.2 9.0 9.4  Total Protein 6.5 - 8.1 g/dL 7.7 - -  Total Bilirubin 0.3 - 1.2 mg/dL 0.8 - -  Alkaline Phos 38 - 126 U/L 36(L) - -  AST 15 - 41 U/L 25 - -  ALT 0 - 44 U/L 39 - -    Lab Results  Component Value Date   TSH 0.435 02/25/2016   Lab Results  Component Value Date   ALBUMIN 4.3 11/20/2020   ANIONGAP 9 11/20/2020   Lab Results  Component Value Date   CHOL 187 11/21/2020   CHOL 153 02/26/2016   CHOL 160 09/06/2015   HDL 44 11/21/2020   HDL 32 (L) 02/26/2016   HDL 41 09/06/2015   LDLCALC 120 (H) 11/21/2020   LDLCALC 101 (H) 02/26/2016   LDLCALC 98 09/06/2015   CHOLHDL 4.3 11/21/2020   CHOLHDL 4.8 02/26/2016   CHOLHDL 3.9 09/06/2015   Lab Results  Component Value Date   TRIG 117 11/21/2020   Lab Results  Component Value Date   HGBA1C 5.3 11/21/2020   HGBA1C 5.2 02/25/2016      ASSESSMENT & PLAN:   Problem List Items Addressed  This Visit       Respiratory   Acute non-recurrent frontal sinusitis - Primary  Patient with frontal sinus pressure and pain x 2 weeks. No fever.   Plan- Sending delayed rx for Augmentin for her to start if she develops fever. Continue all otc meds.        Relevant Medications   amoxicillin-clavulanate (AUGMENTIN) 875-125 MG tablet    Meds ordered this encounter  Medications   amoxicillin-clavulanate (AUGMENTIN) 875-125 MG tablet    Sig: Take 1 tablet by mouth 2 (two) times daily.    Dispense:  20 tablet    Refill:  0      Follow-up: No follow-ups on file.    Irish Lack, FNP [facility] [address] [place], [place] [address]

## 2021-03-16 NOTE — Assessment & Plan Note (Signed)
Patient with frontal sinus pressure and pain x 2 weeks. No fever.   Plan- Sending delayed rx for Augmentin for her to start if she develops fever. Continue all otc meds.

## 2021-05-02 ENCOUNTER — Ambulatory Visit: Payer: Medicaid Other | Admitting: Internal Medicine

## 2021-05-02 ENCOUNTER — Other Ambulatory Visit: Payer: Self-pay

## 2021-05-02 ENCOUNTER — Encounter: Payer: Self-pay | Admitting: Internal Medicine

## 2021-05-02 VITALS — BP 130/90 | HR 77 | Ht 64.0 in | Wt 219.6 lb

## 2021-05-02 DIAGNOSIS — K222 Esophageal obstruction: Secondary | ICD-10-CM | POA: Diagnosis not present

## 2021-05-02 DIAGNOSIS — K219 Gastro-esophageal reflux disease without esophagitis: Secondary | ICD-10-CM | POA: Diagnosis not present

## 2021-05-02 DIAGNOSIS — E042 Nontoxic multinodular goiter: Secondary | ICD-10-CM

## 2021-05-02 DIAGNOSIS — R079 Chest pain, unspecified: Secondary | ICD-10-CM | POA: Diagnosis not present

## 2021-05-02 DIAGNOSIS — R0602 Shortness of breath: Secondary | ICD-10-CM | POA: Diagnosis not present

## 2021-05-02 DIAGNOSIS — K5909 Other constipation: Secondary | ICD-10-CM | POA: Diagnosis not present

## 2021-05-02 DIAGNOSIS — E669 Obesity, unspecified: Secondary | ICD-10-CM | POA: Diagnosis not present

## 2021-05-02 DIAGNOSIS — I10 Essential (primary) hypertension: Secondary | ICD-10-CM | POA: Diagnosis not present

## 2021-05-02 DIAGNOSIS — E890 Postprocedural hypothyroidism: Secondary | ICD-10-CM | POA: Diagnosis not present

## 2021-05-02 DIAGNOSIS — J0110 Acute frontal sinusitis, unspecified: Secondary | ICD-10-CM

## 2021-05-02 NOTE — Assessment & Plan Note (Signed)

## 2021-05-02 NOTE — Assessment & Plan Note (Signed)
Refer to GI 

## 2021-05-02 NOTE — Assessment & Plan Note (Signed)
Denies any dysphagia

## 2021-05-02 NOTE — Assessment & Plan Note (Signed)
Advised to use stool softener

## 2021-05-02 NOTE — Assessment & Plan Note (Signed)
Patient was advised to take Claritin 5 mg daily 

## 2021-05-02 NOTE — Assessment & Plan Note (Signed)
Resolved

## 2021-05-02 NOTE — Assessment & Plan Note (Signed)
Patient is taking thyroid replacement therapy

## 2021-05-02 NOTE — Assessment & Plan Note (Signed)
-   The patient's GERD is stable on medication.  - Instructed the patient to avoid eating spicy and acidic foods, as well as foods high in fat. - Instructed the patient to avoid eating large meals or meals 2-3 hours prior to sleeping. 

## 2021-05-02 NOTE — Progress Notes (Signed)
Established Patient Office Visit  Subjective:  Patient ID: Morgan Stevens, female    DOB: 1966/05/25  Age: 55 y.o. MRN: [id number]  CC:  Chief Complaint  Patient presents with   Follow-up    HPI  [name] presents for general check up  Past Medical History:  Diagnosis Date   Gastritis    Hiatal hernia    Obesity (BMI 30-39.9)    Reflux    Thyroid disease     Past Surgical History:  Procedure Laterality Date   CARDIAC CATHETERIZATION  02/27/2016   Procedure: Left Heart Cath and Coronary Angiography;  Surgeon: Corky Crafts, MD;  Location: [facility] INVASIVE CV LAB;  Service: Cardiovascular;;   CESAREAN SECTION     CHOLECYSTECTOMY     COLONOSCOPY WITH PROPOFOL N/A 04/15/2019   Procedure: COLONOSCOPY WITH PROPOFOL;  Surgeon: Pasty Spillers, MD;  Location: [facility] ENDOSCOPY;  Service: Endoscopy;  Laterality: N/A;   ESOPHAGOGASTRODUODENOSCOPY (EGD) WITH PROPOFOL N/A 04/15/2019   Procedure: ESOPHAGOGASTRODUODENOSCOPY (EGD) WITH PROPOFOL;  Surgeon: Pasty Spillers, MD;  Location: [facility] ENDOSCOPY;  Service: Endoscopy;  Laterality: N/A;   THYROIDECTOMY     THYROIDECTOMY      Family History  Problem Relation Age of Onset   Breast cancer Neg Hx     Social History   Socioeconomic History   Marital status: Married    Spouse name: Not on file   Number of children: Not on file   Years of education: Not on file   Highest education level: Not on file  Occupational History   Occupation: home maker  Tobacco Use   Smoking status: Never   Smokeless tobacco: Never  Vaping Use   Vaping Use: Never used  Substance and Sexual Activity   Alcohol use: No    Alcohol/week: 0.0 standard drinks   Drug use: No   Sexual activity: Not on file  Other Topics Concern   Not on file  Social History Narrative   Home maker, 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, husband is a window Midwife   Social Determinants of Corporate investment banker Strain: Not on file  Food Insecurity: Not on file   Transportation Needs: Not on file  Physical Activity: Not on file  Stress: Not on file  Social Connections: Not on file  Intimate Partner Violence: Not on file     Current Outpatient Medications:    amLODipine (NORVASC) 2.5 MG tablet, Take 1 tablet (2.5 mg total) by mouth daily., Disp: 90 tablet, Rfl: 1   atorvastatin (LIPITOR) 20 MG tablet, Take 1 tablet (20 mg total) by mouth at bedtime., Disp: 90 tablet, Rfl: 3   cyclobenzaprine (FLEXERIL) 5 MG tablet, Take 1 tablet (5 mg total) by mouth 3 (three) times daily as needed. (Patient taking differently: Take 10 mg by mouth 3 (three) times daily as needed for muscle spasms.), Disp: 15 tablet, Rfl: 0   EUTHYROX 125 MCG tablet, Take 125 mcg by mouth every morning., Disp: , Rfl:    famotidine (PEPCID) 40 MG tablet, Take 40 mg by mouth 2 (two) times daily., Disp: , Rfl:    No Known Allergies  ROS Review of Systems  Constitutional: Negative.   HENT: Negative.    Eyes: Negative.   Respiratory: Negative.    Cardiovascular: Negative.   Gastrointestinal: Negative.   Endocrine: Negative.   Genitourinary: Negative.   Musculoskeletal: Negative.   Skin: Negative.   Allergic/Immunologic: Negative.   Neurological: Negative.   Hematological: Negative.   Psychiatric/Behavioral: Negative.  All other systems reviewed and are negative.    Objective:    Physical Exam Vitals reviewed.  Constitutional:      Appearance: Normal appearance.  HENT:     Mouth/Throat:     Mouth: Mucous membranes are moist.  Eyes:     Pupils: Pupils are equal, round, and reactive to light.  Neck:     Vascular: No carotid bruit.  Cardiovascular:     Rate and Rhythm: Normal rate and regular rhythm.     Pulses: Normal pulses.     Heart sounds: Normal heart sounds.  Pulmonary:     Effort: Pulmonary effort is normal.     Breath sounds: Normal breath sounds.  Abdominal:     General: Bowel sounds are normal.     Palpations: Abdomen is soft. There is no  hepatomegaly, splenomegaly or mass.     Tenderness: There is no abdominal tenderness.     Hernia: No hernia is present.  Musculoskeletal:        General: No tenderness.     Cervical back: Neck supple.     Right lower leg: No edema.     Left lower leg: No edema.  Skin:    Findings: No rash.  Neurological:     Mental Status: She is alert and oriented to person, place, and time.     Motor: No weakness.  Psychiatric:        Mood and Affect: Mood and affect normal.        Behavior: Behavior normal.    BP 130/90   Pulse 77   Ht 5\' 4"  (1.626 m)   Wt 219 lb 9.6 oz (99.6 kg)   BMI 37.69 kg/m  Wt Readings from Last 3 Encounters:  05/02/21 219 lb 9.6 oz (99.6 kg)  03/16/21 222 lb 3.2 oz (100.8 kg)  02/02/21 236 lb 12.8 oz (107.4 kg)     Health Maintenance Due  Topic Date Due   Hepatitis C Screening  Never done   PAP SMEAR-Modifier  Never done   COVID-19 Vaccine (3 - Booster for Pfizer series) 05/03/2020   MAMMOGRAM  06/30/2020   INFLUENZA VACCINE  03/27/2021    There are no preventive care reminders to display for this patient.  Lab Results  Component Value Date   TSH 0.435 02/25/2016   Lab Results  Component Value Date   WBC 7.6 11/20/2020   HGB 13.2 11/20/2020   HCT 37.8 11/20/2020   MCV 88.5 11/20/2020   PLT 281 11/20/2020   Lab Results  Component Value Date   NA 141 11/20/2020   K 3.9 11/20/2020   CO2 28 11/20/2020   GLUCOSE 113 (H) 11/20/2020   BUN 11 11/20/2020   CREATININE 0.78 11/20/2020   BILITOT 0.8 11/20/2020   ALKPHOS 36 (L) 11/20/2020   AST 25 11/20/2020   ALT 39 11/20/2020   PROT 7.7 11/20/2020   ALBUMIN 4.3 11/20/2020   CALCIUM 9.2 11/20/2020   ANIONGAP 9 11/20/2020   Lab Results  Component Value Date   CHOL 187 11/21/2020   Lab Results  Component Value Date   HDL 44 11/21/2020   Lab Results  Component Value Date   LDLCALC 120 (H) 11/21/2020   Lab Results  Component Value Date   TRIG 117 11/21/2020   Lab Results  Component  Value Date   CHOLHDL 4.3 11/21/2020   Lab Results  Component Value Date   HGBA1C 5.3 11/21/2020      Assessment & Plan:  Problem List Items Addressed This Visit       Respiratory   Acute non-recurrent frontal sinusitis - Primary    Patient was advised to take Claritin 5 mg daily        Digestive   GERD (gastroesophageal reflux disease)    - The patient's GERD is stable on medication.  - Instructed the patient to avoid eating spicy and acidic foods, as well as foods high in fat. - Instructed the patient to avoid eating large meals or meals 2-3 hours prior to sleeping.      Schatzki's ring of distal esophagus    Denies any dysphagia      Stricture and stenosis of esophagus    Refer to GI        Endocrine   Nontoxic multinodular goiter    Patient is taking thyroid replacement therapy      Postoperative hypothyroidism     Other   Dyspnea (Chronic)   Obesity (BMI 30-39.9) (Chronic)    - I encouraged the patient to lose weight.  - I educated them on making healthy dietary choices including eating more fruits and vegetables and less fried foods. - I encouraged the patient to exercise more, and educated on the benefits of exercise including weight loss, diabetes prevention, and hypertension prevention.   Dietary counseling with a registered dietician  Referral to a weight management support group (e.g. Weight Watchers, Overeaters Anonymous)  If your BMI is greater than 29 or you have gained more than 15 pounds you should work on weight loss.  Attend a healthy cooking class       Chest pain    Resolved      Other constipation    Advised to use stool softener      Other Visit Diagnoses     Primary hypertension           No orders of the defined types were placed in this encounter.   Follow-up: No follow-ups on file.    Corky Downs, MD

## 2021-05-16 ENCOUNTER — Other Ambulatory Visit: Payer: Self-pay

## 2021-05-16 ENCOUNTER — Other Ambulatory Visit: Payer: Self-pay | Admitting: Family Medicine

## 2021-05-16 ENCOUNTER — Ambulatory Visit: Payer: Medicaid Other | Admitting: Family Medicine

## 2021-05-16 ENCOUNTER — Encounter: Payer: Self-pay | Admitting: Family Medicine

## 2021-05-16 VITALS — BP 147/75 | HR 68 | Ht 64.0 in | Wt 223.6 lb

## 2021-05-16 DIAGNOSIS — J3089 Other allergic rhinitis: Secondary | ICD-10-CM | POA: Diagnosis not present

## 2021-05-16 DIAGNOSIS — Z8632 Personal history of gestational diabetes: Secondary | ICD-10-CM

## 2021-05-16 DIAGNOSIS — E890 Postprocedural hypothyroidism: Secondary | ICD-10-CM | POA: Diagnosis not present

## 2021-05-16 DIAGNOSIS — E7800 Pure hypercholesterolemia, unspecified: Secondary | ICD-10-CM

## 2021-05-16 DIAGNOSIS — Z0000 Encounter for general adult medical examination without abnormal findings: Secondary | ICD-10-CM

## 2021-05-16 DIAGNOSIS — J309 Allergic rhinitis, unspecified: Secondary | ICD-10-CM | POA: Insufficient documentation

## 2021-05-16 DIAGNOSIS — K449 Diaphragmatic hernia without obstruction or gangrene: Secondary | ICD-10-CM | POA: Diagnosis not present

## 2021-05-16 DIAGNOSIS — Z23 Encounter for immunization: Secondary | ICD-10-CM | POA: Diagnosis not present

## 2021-05-16 MED ORDER — FLUTICASONE PROPIONATE 50 MCG/ACT NA SUSP: 2.0000 | Freq: Every day | NASAL | 3 refills | Status: DC

## 2021-05-16 NOTE — Progress Notes (Signed)
Subjective:    Patient ID: Morgan Stevens, female    DOB: 1966-02-29, 55 y.o.   MRN: [id number]  Morgan Stevens is a 55 y.o. female presenting on 05/16/2021 for Establish Care  Initially born in [place], lived in [place] since 1987. She lives in [place]. 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   HPI  CHRONIC HTN: Reports new diagnosis HTN in Feb 2022, she has normal BP at home 120s usually but then higher readings in doctors office. Current Meds - Amlodipine 2.5mg  daily Reports good compliance, took meds today. Tolerating well, w/o complaints. Lifestyle: Weight loss - Diet: limited salt, low caffeine - Exercise: active, but no regular exercise. Some walking. Denies CP, dyspnea, HA, edema, dizziness / lightheadedness  HYPERLIPIDEMIA: - Reports concerns with recent elevated LDL 120 in 10/2020, had issue with facial numbness seen in ED had imaging MRI initially worried about TIA / CVA but then later disproved determined Cervical spine disc etiology and has seen Neurosurgery - Currently taking Atorvastatin 20mg , tolerating well without side effects or myalgias  Cervical Spine DDD, nerve impingement Left Shoulder Chronic Pain Has followed with Dr Neurosurgery [facility] and Physical Therapy Taking Flexeril 5mg  PRN with some good results.  Hypothyroidism, acquired Initially diagnosed low thyroid in 2017 She has had fluctuation of dosage on thyroid.  GERD, Gastritis / Esophagitis Hiatal Hernia Continues to follow with [facility] Dr , has had EGD / Colonoscopy in recent history Taking Famotidine 40mg  BID with good results Has Maalox PRN as well  Recurrent Maxillary Sinusitis Post Nasal Drainage History of 2 year, episodic sinus symptoms Taking OTC sinus + decongestant pills PRN. Regularly taking mixed results. Taking Saline PRN   Health Maintenance: Due for Flu Shot, will receive today    Depression screen [facility] 2/9 05/16/2021 02/02/2021  Decreased Interest 0 0  Down, Depressed, Hopeless 1  0  PHQ - 2 Score 1 0  Altered sleeping 0 -  Tired, decreased energy 2 -  Change in appetite 2 -  Feeling bad or failure about yourself  1 -  Trouble concentrating 0 -  Moving slowly or fidgety/restless 0 -  Suicidal thoughts 0 -  PHQ-9 Score 6 -  Difficult doing work/chores Not difficult at all -    Past Medical History:  Diagnosis Date   Chronic headaches    Gastritis    GERD (gastroesophageal reflux disease)    Hiatal hernia    Hyperlipidemia    Hypertension    Obesity (BMI 30-39.9)    Reflux    Thyroid disease    Past Surgical History:  Procedure Laterality Date   CARDIAC CATHETERIZATION  02/27/2016   Procedure: Left Heart Cath and Coronary Angiography;  Surgeon: 05/18/2021, MD;  Location: [facility] INVASIVE CV LAB;  Service: Cardiovascular;;   CESAREAN SECTION     CHOLECYSTECTOMY     COLONOSCOPY WITH PROPOFOL N/A 04/15/2019   Procedure: COLONOSCOPY WITH PROPOFOL;  Surgeon: Corky Crafts, MD;  Location: [facility] ENDOSCOPY;  Service: Endoscopy;  Laterality: N/A;   ESOPHAGOGASTRODUODENOSCOPY (EGD) WITH PROPOFOL N/A 04/15/2019   Procedure: ESOPHAGOGASTRODUODENOSCOPY (EGD) WITH PROPOFOL;  Surgeon: 04/17/2019, MD;  Location: [facility] ENDOSCOPY;  Service: Endoscopy;  Laterality: N/A;   THYROIDECTOMY     THYROIDECTOMY     Social History   Socioeconomic History   Marital status: Married    Spouse name: Not on file   Number of children: Not on file   Years of education: Not on file   Highest education level: Not on  file  Occupational History   Occupation: home maker  Tobacco Use   Smoking status: Never   Smokeless tobacco: Never  Vaping Use   Vaping Use: Never used  Substance and Sexual Activity   Alcohol use: No    Alcohol/week: 0.0 standard drinks   Drug use: No   Sexual activity: Not on file  Other Topics Concern   Not on file  Social History Narrative   Home maker, 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, husband is a window Midwife   Social Determinants of Manufacturing engineer Strain: Not on file  Food Insecurity: Not on file  Transportation Needs: Not on file  Physical Activity: Not on file  Stress: Not on file  Social Connections: Not on file  Intimate Partner Violence: Not on file   Family History  Problem Relation Age of Onset   Congestive Heart Failure Mother    Breast cancer Neg Hx    Current Outpatient Medications on File Prior to Visit  Medication Sig   amLODipine (NORVASC) 2.5 MG tablet Take 1 tablet (2.5 mg total) by mouth daily.   aspirin 81 MG EC tablet Take 1 tablet by mouth daily.   atorvastatin (LIPITOR) 20 MG tablet Take 1 tablet (20 mg total) by mouth at bedtime.   cyclobenzaprine (FLEXERIL) 5 MG tablet Take 1 tablet (5 mg total) by mouth 3 (three) times daily as needed. (Patient taking differently: Take 10 mg by mouth 3 (three) times daily as needed for muscle spasms.)   EUTHYROX 125 MCG tablet Take 125 mcg by mouth every morning.   famotidine (PEPCID) 40 MG tablet Take 40 mg by mouth 2 (two) times daily.   Multiple Vitamin (MULTIVITAMIN) capsule Take 1 capsule by mouth daily.   No current facility-administered medications on file prior to visit.    Review of Systems Per HPI unless specifically indicated above     Objective:    BP (!) 147/75   Pulse 68   Ht 5\' 4"  (1.626 m)   Wt 223 lb 9.6 oz (101.4 kg)   SpO2 100%   BMI 38.38 kg/m   Wt Readings from Last 3 Encounters:  05/16/21 223 lb 9.6 oz (101.4 kg)  05/02/21 219 lb 9.6 oz (99.6 kg)  03/16/21 222 lb 3.2 oz (100.8 kg)    Physical Exam Vitals and nursing note reviewed.  Constitutional:      General: She is not in acute distress.    Appearance: She is well-developed. She is not diaphoretic.     Comments: Well-appearing, comfortable, cooperative  HENT:     Head: Normocephalic and atraumatic.  Eyes:     General:        Right eye: No discharge.        Left eye: No discharge.     Conjunctiva/sclera: Conjunctivae normal.  Neck:     Thyroid: No  thyromegaly.  Cardiovascular:     Rate and Rhythm: Normal rate and regular rhythm.     Heart sounds: Normal heart sounds. No murmur heard. Pulmonary:     Effort: Pulmonary effort is normal. No respiratory distress.     Breath sounds: Normal breath sounds. No wheezing or rales.  Musculoskeletal:        General: Normal range of motion.     Cervical back: Normal range of motion and neck supple.  Lymphadenopathy:     Cervical: No cervical adenopathy.  Skin:    General: Skin is warm and dry.     Findings: No erythema  or rash.  Neurological:     Mental Status: She is alert and oriented to person, place, and time.  Psychiatric:        Behavior: Behavior normal.     Comments: Well groomed, good eye contact, normal speech and thoughts    I have personally reviewed the radiology report from 11/21/20 on MRI Neck C spine.  CLINICAL DATA:  Acute onset right upper and lower extremity and right facial numbness.   EXAM: MRI CERVICAL SPINE WITHOUT CONTRAST   TECHNIQUE: Multiplanar, multisequence MR imaging of the cervical spine was performed. No intravenous contrast was administered.   COMPARISON:  None.   FINDINGS: Alignment: Maintained.   Vertebrae: No fracture, evidence of discitis, or bone lesion.   Cord: Normal signal throughout.   Posterior Fossa, vertebral arteries, paraspinal tissues: Negative.   Disc levels:   C2-3: Negative.   C3-4: A small central disc protrusion just contacts the ventral cord. The foramina are widely patent.   C4-5: Shallow broad-based disc bulge and uncovertebral spurring. The ventral thecal sac is effaced. Mild bilateral foraminal narrowing is more notable on the right.   C5-6: Central disc protrusion indents the ventral cord. The foramina are open.   C6-7: A right paracentral disc protrusion deforms the right hemicord. The foramina are open.   C7-T1: Negative.   IMPRESSION: Central disc protrusion at C5-6 indents the ventral cord.    Right paracentral protrusion at C6-7 deforms the right hemicord.   Small central protrusion at C3-4 contacts the cord without cord deformity.   Shallow disc bulges C4-5 effaces the ventral thecal sac. Mild bilateral foraminal narrowing at this level is worse on the right.     Electronically Signed   By: Drusilla Kanner M.D.   On: 11/21/2020 11:46   Results for orders placed or performed during the hospital encounter of 11/20/20  Resp Panel by RT-PCR (Flu A&B, Covid) Nasopharyngeal Swab   Specimen: Nasopharyngeal Swab; Nasopharyngeal(NP) swabs in vial transport medium  Result Value Ref Range   SARS Coronavirus 2 by RT PCR NEGATIVE NEGATIVE   Influenza A by PCR NEGATIVE NEGATIVE   Influenza B by PCR NEGATIVE NEGATIVE  Protime-INR  Result Value Ref Range   Prothrombin Time 12.5 11.4 - 15.2 seconds   INR 1.0 0.8 - 1.2  APTT  Result Value Ref Range   aPTT 30 24 - 36 seconds  CBC  Result Value Ref Range   WBC 7.6 4.0 - 10.5 K/uL   RBC 4.27 3.87 - 5.11 MIL/uL   Hemoglobin 13.2 12.0 - 15.0 g/dL   HCT 82.4 23.5 - 36.1 %   MCV 88.5 80.0 - 100.0 fL   MCH 30.9 26.0 - 34.0 pg   MCHC 34.9 30.0 - 36.0 g/dL   RDW 44.3 15.4 - 00.8 %   Platelets 281 150 - 400 K/uL   nRBC 0.0 0.0 - 0.2 %  Differential  Result Value Ref Range   Neutrophils Relative % 36 %   Neutro Abs 2.8 1.7 - 7.7 K/uL   Lymphocytes Relative 53 %   Lymphs Abs 4.0 0.7 - 4.0 K/uL   Monocytes Relative 8 %   Monocytes Absolute 0.6 0.1 - 1.0 K/uL   Eosinophils Relative 2 %   Eosinophils Absolute 0.2 0.0 - 0.5 K/uL   Basophils Relative 1 %   Basophils Absolute 0.1 0.0 - 0.1 K/uL   Immature Granulocytes 0 %   Abs Immature Granulocytes 0.02 0.00 - 0.07 K/uL  Comprehensive metabolic panel  Result  Value Ref Range   Sodium 141 135 - 145 mmol/L   Potassium 3.9 3.5 - 5.1 mmol/L   Chloride 104 98 - 111 mmol/L   CO2 28 22 - 32 mmol/L   Glucose, Bld 113 (H) 70 - 99 mg/dL   BUN 11 6 - 20 mg/dL   Creatinine, Ser 1.44 0.44  - 1.00 mg/dL   Calcium 9.2 8.9 - 81.8 mg/dL   Total Protein 7.7 6.5 - 8.1 g/dL   Albumin 4.3 3.5 - 5.0 g/dL   AST 25 15 - 41 U/L   ALT 39 0 - 44 U/L   Alkaline Phosphatase 36 (L) 38 - 126 U/L   Total Bilirubin 0.8 0.3 - 1.2 mg/dL   GFR, Estimated >56 >31 mL/min   Anion gap 9 5 - 15  HIV Antibody (routine testing w rflx)  Result Value Ref Range   HIV Screen 4th Generation wRfx Non Reactive Non Reactive  Hemoglobin A1c  Result Value Ref Range   Hgb A1c MFr Bld 5.3 4.8 - 5.6 %   Mean Plasma Glucose 105.41 mg/dL  Lipid panel  Result Value Ref Range   Cholesterol 187 0 - 200 mg/dL   Triglycerides 497 <026 mg/dL   HDL 44 >37 mg/dL   Total CHOL/HDL Ratio 4.3 RATIO   VLDL 23 0 - 40 mg/dL   LDL Cholesterol 858 (H) 0 - 99 mg/dL  CBG monitoring, ED  Result Value Ref Range   Glucose-Capillary 115 (H) 70 - 99 mg/dL  POC urine preg, ED  Result Value Ref Range   Preg Test, Ur Negative Negative      Assessment & Plan:   Problem List Items Addressed This Visit     Postoperative hypothyroidism - Primary   Morbid obesity (HCC)    Morbid obesity BMI >38 with comorbid factors HTN HLD GERD      History of gestational diabetes   Hiatal hernia   Allergic rhinitis due to allergen   Other Visit Diagnoses     Needs flu shot       Relevant Medications   fluticasone (FLONASE) 50 MCG/ACT nasal spray   Other Relevant Orders   Flu Vaccine QUAD 102mo+IM (Fluarix, Fluzone & Alfiuria Quad PF) (Completed)       Hypothyroidism Continue current Euthyrox daily Check labs upcoming  GERD / Hiatal Hernia Followed by [facility] Reviewed prior EGD Continue current H2 blocker Famotidine 40 BID  Sinusitis, chronic allergic Inadequately controlled Start anti histamine daily Start nasal steroid Flonase 2 sprays in each nostril daily for 4-6 weeks, may repeat course seasonally or as needed  HTN Mild elevated today, improve on recheck Check Home BP log bring to next visit For now continue  Amlodipine 2.5mg  daily, low dose discussed could adjust in future either DC if not indicated or inc dose if needed.  HLD Mild elevated LDL >120 ASCVD risk is low, no proven TIA vs CVA in past. She was started on this for CVA risk reduction, discussed may not be indicated at this time.  Meds ordered this encounter  Medications   fluticasone (FLONASE) 50 MCG/ACT nasal spray    Sig: Place 2 sprays into both nostrils daily. Use for 4-6 weeks then stop and use seasonally or as needed.    Dispense:  16 g    Refill:  3      Follow up plan: Return in about 6 weeks (around 06/27/2021) for 6 week fasting lab only then 1 week later Annual Physical.  Future  labs 06/27/21 CMET A1c CBC Lipid, TSH T4  Saralyn Pilar, DO [facility] 05/16/2021, 10:33 AM

## 2021-05-16 NOTE — Assessment & Plan Note (Signed)
Morbid obesity BMI >38 with comorbid factors HTN HLD GERD

## 2021-05-16 NOTE — Patient Instructions (Addendum)
Thank you for coming to the office today.  For sinuses Likely allergy mediated  Start generic claritin Loratadine or generic zyrtec Cetirizine, or generic allegra - Fexofenadine - pick one take it daily.  Start nasal steroid Flonase 2 sprays in each nostril daily for 4-6 weeks, may repeat course seasonally or as needed  Future can add Singulair generic rx.  Keep track of BP regularly, goal to keep below 140 / 90, ideally < 135 / 85  May adjust your med in future  Cholesterol med is very strong dose, you may not need in future.  DUE for FASTING BLOOD WORK (no food or drink after midnight before the lab appointment, only water or coffee without cream/sugar on the morning of)  SCHEDULE "Lab Only" visit in the morning at the clinic for lab draw in 6 WEEKS   - Make sure Lab Only appointment is at about 1 week before your next appointment, so that results will be available  For Lab Results, once available within 2-3 days of blood draw, you can can log in to MyChart online to view your results and a brief explanation. Also, we can discuss results at next follow-up visit.    Please schedule a Follow-up Appointment to: Return in about 6 weeks (around 06/27/2021) for 6 week fasting lab only then 1 week later Annual Physical.  If you have any other questions or concerns, please feel free to call the office or send a message through MyChart. You may also schedule an earlier appointment if necessary.  Additionally, you may be receiving a survey about your experience at our office within a few days to 1 week by e-mail or mail. We value your feedback.  Saralyn Pilar, DO [facility], [place]

## 2021-06-26 ENCOUNTER — Other Ambulatory Visit: Payer: Self-pay

## 2021-06-26 DIAGNOSIS — E890 Postprocedural hypothyroidism: Secondary | ICD-10-CM

## 2021-06-26 DIAGNOSIS — Z8632 Personal history of gestational diabetes: Secondary | ICD-10-CM

## 2021-06-26 DIAGNOSIS — E7800 Pure hypercholesterolemia, unspecified: Secondary | ICD-10-CM

## 2021-06-26 DIAGNOSIS — Z0000 Encounter for general adult medical examination without abnormal findings: Secondary | ICD-10-CM

## 2021-06-27 ENCOUNTER — Other Ambulatory Visit: Payer: Medicaid Other

## 2021-06-27 ENCOUNTER — Other Ambulatory Visit: Payer: Self-pay

## 2021-06-27 DIAGNOSIS — E7800 Pure hypercholesterolemia, unspecified: Secondary | ICD-10-CM | POA: Diagnosis not present

## 2021-06-27 DIAGNOSIS — Z0000 Encounter for general adult medical examination without abnormal findings: Secondary | ICD-10-CM | POA: Diagnosis not present

## 2021-06-27 DIAGNOSIS — E890 Postprocedural hypothyroidism: Secondary | ICD-10-CM | POA: Diagnosis not present

## 2021-06-27 DIAGNOSIS — Z8632 Personal history of gestational diabetes: Secondary | ICD-10-CM | POA: Diagnosis not present

## 2021-06-28 LAB — CBC WITH DIFFERENTIAL/PLATELET
Absolute Monocytes: 403 cells/uL (ref 200–950)
Basophils Absolute: 73 cells/uL (ref 0–200)
Basophils Relative: 1.2 %
Eosinophils Absolute: 293 cells/uL (ref 15–500)
Eosinophils Relative: 4.8 %
HCT: 39.5 % (ref 35.0–45.0)
Hemoglobin: 13.3 g/dL (ref 11.7–15.5)
Lymphs Abs: 3331 cells/uL (ref 850–3900)
MCH: 31.3 pg (ref 27.0–33.0)
MCHC: 33.7 g/dL (ref 32.0–36.0)
MCV: 92.9 fL (ref 80.0–100.0)
MPV: 10.8 fL (ref 7.5–12.5)
Monocytes Relative: 6.6 %
Neutro Abs: 2001 cells/uL (ref 1500–7800)
Neutrophils Relative %: 32.8 %
Platelets: 273 10*3/uL (ref 140–400)
RBC: 4.25 10*6/uL (ref 3.80–5.10)
RDW: 12.7 % (ref 11.0–15.0)
Total Lymphocyte: 54.6 %
WBC: 6.1 10*3/uL (ref 3.8–10.8)

## 2021-06-28 LAB — COMPLETE METABOLIC PANEL WITH GFR
AG Ratio: 1.6 (calc) (ref 1.0–2.5)
ALT: 20 U/L (ref 6–29)
AST: 13 U/L (ref 10–35)
Albumin: 4.2 g/dL (ref 3.6–5.1)
Alkaline phosphatase (APISO): 45 U/L (ref 37–153)
BUN: 11 mg/dL (ref 7–25)
CO2: 28 mmol/L (ref 20–32)
Calcium: 9.3 mg/dL (ref 8.6–10.4)
Chloride: 104 mmol/L (ref 98–110)
Creat: 0.78 mg/dL (ref 0.50–1.03)
Globulin: 2.7 g/dL (calc) (ref 1.9–3.7)
Glucose, Bld: 121 mg/dL — ABNORMAL HIGH (ref 65–99)
Potassium: 4.2 mmol/L (ref 3.5–5.3)
Sodium: 140 mmol/L (ref 135–146)
Total Bilirubin: 0.6 mg/dL (ref 0.2–1.2)
Total Protein: 6.9 g/dL (ref 6.1–8.1)
eGFR: 91 mL/min/{1.73_m2} (ref >=60)

## 2021-06-28 LAB — HEMOGLOBIN A1C
Hgb A1c MFr Bld: 5.1 % of total Hgb (ref <5.7)
Mean Plasma Glucose: 100 mg/dL
eAG (mmol/L): 5.5 mmol/L

## 2021-06-28 LAB — LIPID PANEL
Cholesterol: 131 mg/dL (ref <200)
HDL: 48 mg/dL — ABNORMAL LOW (ref >=50)
LDL Cholesterol (Calc): 67 mg/dL (calc)
Non-HDL Cholesterol (Calc): 83 mg/dL (calc) (ref <130)
Total CHOL/HDL Ratio: 2.7 (calc) (ref <5.0)
Triglycerides: 82 mg/dL (ref <150)

## 2021-06-28 LAB — TSH: TSH: 1.53 mIU/L

## 2021-06-28 LAB — T4, FREE: Free T4: 1.4 ng/dL (ref 0.8–1.8)

## 2021-07-03 ENCOUNTER — Encounter: Payer: Self-pay | Admitting: Family Medicine

## 2021-07-03 ENCOUNTER — Other Ambulatory Visit: Payer: Self-pay

## 2021-07-03 ENCOUNTER — Ambulatory Visit (INDEPENDENT_AMBULATORY_CARE_PROVIDER_SITE_OTHER): Payer: Medicaid Other | Admitting: Family Medicine

## 2021-07-03 VITALS — BP 139/78 | HR 78 | Ht 64.0 in | Wt 233.0 lb

## 2021-07-03 DIAGNOSIS — Z0000 Encounter for general adult medical examination without abnormal findings: Secondary | ICD-10-CM | POA: Diagnosis not present

## 2021-07-03 DIAGNOSIS — Z1231 Encounter for screening mammogram for malignant neoplasm of breast: Secondary | ICD-10-CM | POA: Diagnosis not present

## 2021-07-03 DIAGNOSIS — E890 Postprocedural hypothyroidism: Secondary | ICD-10-CM

## 2021-07-03 DIAGNOSIS — K219 Gastro-esophageal reflux disease without esophagitis: Secondary | ICD-10-CM

## 2021-07-03 MED ORDER — EUTHYROX 125 MCG PO TABS: 125.0000 ug | ORAL_TABLET | Freq: Every day | ORAL | 3 refills | Status: DC

## 2021-07-03 MED ORDER — FAMOTIDINE 40 MG PO TABS: 40.0000 mg | ORAL_TABLET | Freq: Two times a day (BID) | ORAL | 3 refills | Status: DC

## 2021-07-03 NOTE — Patient Instructions (Addendum)
Thank you for coming to the office today.  For Mammogram screening for breast cancer   Call the Imaging Center below anytime to schedule your own appointment now that order has been placed.  [facility] [address] [place], [place] [address] Phone: [phone]  ------------------------------------------------------------------- Main two [place] [facility] providers at these locations - call them or call us for a referral. For a routine pap smear / evaluation.  Or can go to [facility] through [place].   [facility] [address] [place], [place] [address] Hours: Lewayne Bunting Main: [phone]  [facility]   Address: [address] [place], [place] [address], [place], [place] [address] Hours: 8AM-5PM Phone: [phone]  --------------------------------------------  Please let me know on MyChart within 1-2 weeks if/ when you are ready for a referral for the sinuses.  [facility] [address]  [place], [place] [address] Ph: [phone]   DUE for FASTING BLOOD WORK (no food or drink after midnight before the lab appointment, only water or coffee without cream/sugar on the morning of)  SCHEDULE "Lab Only" visit in the morning at the clinic for lab draw in 1 YEAR  - Make sure Lab Only appointment is at about 1 week before your next appointment, so that results will be available  For Lab Results, once available within 2-3 days of blood draw, you can can log in to MyChart online to view your results and a brief explanation. Also, we can discuss results at next follow-up visit.   Please schedule a Follow-up Appointment to: Return in about 1 year (around 07/03/2022) for 1 year fasting lab only then 1 week later Annual Physical.  If you have any other questions or concerns, please feel free to call the office or send a message through MyChart. You may also  schedule an earlier appointment if necessary.  Additionally, you may be receiving a survey about your experience at our office within a few days to 1 week by e-mail or mail. We value your feedback.  Saralyn Pilar, DO [facility], [place]

## 2021-07-03 NOTE — Progress Notes (Signed)
Subjective:    Patient ID: Morgan Stevens, female    DOB: 05-23-1966, 55 y.o.   MRN: [id number]  Morgan Stevens is a 55 y.o. female presenting on 07/03/2021 for Annual Exam   HPI  Here for Annual Physical and Lab Review  CHRONIC HTN: Reports new diagnosis HTN in Feb 2022, she has normal BP at home 120s usually but then higher readings in doctors office. Current Meds - Amlodipine 2.70m daily Reports good compliance, took meds today. Tolerating well, w/o complaints. Lifestyle: Weight loss - Diet: limited salt, low caffeine - Exercise: active, but no regular exercise. Some walking. Denies CP, dyspnea, HA, edema, dizziness / lightheadedness   HYPERLIPIDEMIA: Last labs with Lipid panel LDL 67 improved and TG normalized  History in past - had issue with facial numbness seen in ED had imaging MRI initially worried about TIA / CVA but then later disproved determined Cervical spine disc etiology and has seen Neurosurgery - Currently taking Atorvastatin 240m tolerating well without side effects or myalgias   Cervical Spine DDD, nerve impingement Left Shoulder Chronic Pain [facility] Taking Flexeril 5m54mRN with some good results.   Hypothyroidism, acquired Initially diagnosed low thyroid in 2017 Last lab 06/2021, shows normal TSH and T4 Continues on Euthyrox 125m26maily   GERD, Gastritis / Esophagitis Hiatal Hernia Continues to follow with [facility] Dr TahiBonna Gainss had EGD / Colonoscopy in recent history Taking Famotidine 40mg62m with good results Has Maalox PRN as well   Recurrent Maxillary Sinusitis Post Nasal Drainage History of 2 year, episodic sinus symptoms Taking OTC sinus + decongestant pills PRN. Regularly taking mixed results. Taking Saline PRN On Flonase. Has not seen ENT     Health Maintenance:  UTD Flu Shot  Declines COVID Booster  Breast CA Screening: Due for mammogram screening. [facility]  [facility]18 [facility]. No known family history of breast cancer. Currently asymptomatic.  Colonoscopy completed 04/15/2019, Dr TahilBonna Gainst due in 10 years, or 2030.  Depression screen [facility] 05/16/2021 02/02/2021  Decreased Interest 0 0 0  Down, Depressed, Hopeless 1 1 0  PHQ - 2 Score 1 1 0  Altered sleeping 0 0 -  Tired, decreased energy 1 2 -  Change in appetite 1 2 -  Feeling bad or failure about yourself  1 1 -  Trouble concentrating 0 0 -  Moving slowly or fidgety/restless 0 0 -  Suicidal thoughts 0 0 -  PHQ-9 Score 4 6 -  Difficult doing work/chores Not difficult at all Not difficult at all -    Past Medical History:  Diagnosis Date   Chronic headaches    Gastritis    GERD (gastroesophageal reflux disease)    Hiatal hernia    Hyperlipidemia    Hypertension    Obesity (BMI 30-39.9)    Reflux    Thyroid disease    Past Surgical History:  Procedure Laterality Date   CARDIAC CATHETERIZATION  02/27/2016   Procedure: Left Heart Cath and Coronary Angiography;  Surgeon: JayadJettie Booze  [facility];  Service: Cardiovascular;;   CESAREAN SECTION     CHOLECYSTECTOMY     COLONOSCOPY WITH PROPOFOL N/A 04/15/2019   Procedure: COLONOSCOPY WITH PROPOFOL;  Surgeon: TahilVirgel Manifold  Location: [facility] ENDOSCOPY;  Service: Endoscopy;  Laterality: N/A;   ESOPHAGOGASTRODUODENOSCOPY (EGD) WITH PROPOFOL N/A 04/15/2019   Procedure: ESOPHAGOGASTRODUODENOSCOPY (EGD) WITH  PROPOFOL;  Surgeon: Virgel Manifold, MD;  Location[facility] ENDOSCOPY;  Service: Endoscopy;  Laterality: N/A;   THYROIDECTOMY     THYROIDECTOMY     Social History   Socioeconomic History   Marital status: Married    Spouse name: Not on file   Number of children: Not on file   Years of education: Not on file   Highest education level: Not on file  Occupational History   Occupation: home maker  Tobacco Use   Smoking  status: Never   Smokeless tobacco: Never  Vaping Use   Vaping Use: Never used  Substance and Sexual Activity   Alcohol use: No    Alcohol/week: 0.0 standard drinks   Drug use: No   Sexual activity: Not on file  Other Topics Concern   Not on file  Social History Narrative   Home maker, 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, husband is a window Copy   Social Determinants of Radio broadcast assistant Strain: Not on file  Food Insecurity: Not on file  Transportation Needs: Not on file  Physical Activity: Not on file  Stress: Not on file  Social Connections: Not on file  Intimate Partner Violence: Not on file   Family History  Problem Relation Age of Onset   Congestive Heart Failure Mother    Breast cancer Neg Hx    Current Outpatient Medications on File Prior to Visit  Medication Sig   amLODipine (NORVASC) 2.5 MG tablet Take 1 tablet (2.5 mg total) by mouth daily.   aspirin 81 MG EC tablet Take 1 tablet by mouth daily.   cyclobenzaprine (FLEXERIL) 5 MG tablet Take 1 tablet (5 mg total) by mouth 3 (three) times daily as needed. (Patient taking differently: Take 10 mg by mouth 3 (three) times daily as needed for muscle spasms.)   fluticasone (FLONASE) 50 MCG/ACT nasal spray Place 2 sprays into both nostrils daily. Use for 4-6 weeks then stop and use seasonally or as needed.   Multiple Vitamin (MULTIVITAMIN) capsule Take 1 capsule by mouth daily.   atorvastatin (LIPITOR) 20 MG tablet Take 1 tablet (20 mg total) by mouth at bedtime.   No current facility-administered medications on file prior to visit.    Review of Systems  Constitutional:  Negative for activity change, appetite change, chills, diaphoresis, fatigue and fever.  HENT:  Negative for congestion and hearing loss.   Eyes:  Negative for visual disturbance.  Respiratory:  Negative for cough, chest tightness, shortness of breath and wheezing.   Cardiovascular:  Negative for chest pain, palpitations and leg swelling.   Gastrointestinal:  Negative for abdominal pain, constipation, diarrhea, nausea and vomiting.  Genitourinary:  Negative for dysuria, frequency and hematuria.  Musculoskeletal:  Negative for arthralgias and neck pain.  Skin:  Negative for rash.  Neurological:  Negative for dizziness, weakness, light-headedness, numbness and headaches.  Hematological:  Negative for adenopathy.  Psychiatric/Behavioral:  Negative for behavioral problems, dysphoric mood and sleep disturbance.   Per HPI unless specifically indicated above      Objective:    BP 139/78   Pulse 78   Ht _0  (1.626 m)   Wt 233 lb (105.7 kg)   SpO2 100%   BMI 39.99 kg/m   Wt Readings from Last 3 Encounters:  07/03/21 233 lb (105.7 kg)  05/16/21 223 lb 9.6 oz (101.4 kg)  05/02/21 219 lb 9.6 oz (99.6 kg)    Physical Exam Vitals and nursing note reviewed.  Constitutional:  General: She is not in acute distress.    Appearance: She is well-developed. She is obese. She is not diaphoretic.     Comments: Well-appearing, comfortable, cooperative  HENT:     Head: Normocephalic and atraumatic.     Right Ear: Tympanic membrane, ear canal and external ear normal. There is no impacted cerumen.     Left Ear: Tympanic membrane, ear canal and external ear normal. There is no impacted cerumen.  Eyes:     General:        Right eye: No discharge.        Left eye: No discharge.     Conjunctiva/sclera: Conjunctivae normal.     Pupils: Pupils are equal, round, and reactive to light.  Neck:     Thyroid: No thyromegaly.     Vascular: No carotid bruit.  Cardiovascular:     Rate and Rhythm: Normal rate and regular rhythm.     Pulses: Normal pulses.     Heart sounds: Normal heart sounds. No murmur heard. Pulmonary:     Effort: Pulmonary effort is normal. No respiratory distress.     Breath sounds: Normal breath sounds. No wheezing or rales.  Abdominal:     General: Bowel sounds are normal. There is no distension.     Palpations:  Abdomen is soft. There is no mass.     Tenderness: There is no abdominal tenderness.  Musculoskeletal:        General: No tenderness. Normal range of motion.     Cervical back: Normal range of motion and neck supple.     Right lower leg: No edema.     Left lower leg: No edema.     Comments: Upper / Lower Extremities: - Normal muscle tone, strength bilateral upper extremities 5/5, lower extremities 5/5  Lymphadenopathy:     Cervical: No cervical adenopathy.  Skin:    General: Skin is warm and dry.     Findings: No erythema or rash.  Neurological:     Mental Status: She is alert and oriented to person, place, and time.     Comments: Distal sensation intact to light touch all extremities  Psychiatric:        Mood and Affect: Mood normal.        Behavior: Behavior normal.        Thought Content: Thought content normal.     Comments: Well groomed, good eye contact, normal speech and thoughts     Results for orders placed or performed in visit on 06/26/21  T4, free  Result Value Ref Range   Free T4 1.4 0.8 - 1.8 ng/dL  TSH  Result Value Ref Range   TSH 1.53 mIU/L  Hemoglobin A1c  Result Value Ref Range   Hgb A1c MFr Bld 5.1 <5.7 % of total Hgb   Mean Plasma Glucose 100 mg/dL   eAG (mmol/L) 5.5 mmol/L  CBC with Differential/Platelet  Result Value Ref Range   WBC 6.1 3.8 - 10.8 Thousand/uL   RBC 4.25 3.80 - 5.10 Million/uL   Hemoglobin 13.3 11.7 - 15.5 g/dL   HCT 39.5 35.0 - 45.0 %   MCV 92.9 80.0 - 100.0 fL   MCH 31.3 27.0 - 33.0 pg   MCHC 33.7 32.0 - 36.0 g/dL   RDW 12.7 11.0 - 15.0 %   Platelets 273 140 - 400 Thousand/uL   MPV 10.8 7.5 - 12.5 fL   Neutro Abs 2,001 1,500 - 7,800 cells/uL   Lymphs Abs 3,331 850 -  3,900 cells/uL   Absolute Monocytes 403 200 - 950 cells/uL   Eosinophils Absolute 293 15 - 500 cells/uL   Basophils Absolute 73 0 - 200 cells/uL   Neutrophils Relative % 32.8 %   Total Lymphocyte 54.6 %   Monocytes Relative 6.6 %   Eosinophils Relative 4.8 %    Basophils Relative 1.2 %  Lipid panel  Result Value Ref Range   Cholesterol 131 <200 mg/dL   HDL 48 (L) > OR = 50 mg/dL   Triglycerides 82 <150 mg/dL   LDL Cholesterol (Calc) 67 mg/dL (calc)   Total CHOL/HDL Ratio 2.7 <5.0 (calc)   Non-HDL Cholesterol (Calc) 83 <130 mg/dL (calc)  COMPLETE METABOLIC PANEL WITH GFR  Result Value Ref Range   Glucose, Bld 121 (H) 65 - 99 mg/dL   BUN 11 7 - 25 mg/dL   Creat 0.78 0.50 - 1.03 mg/dL   eGFR 91 > OR = 60 mL/min/1.56m   BUN/Creatinine Ratio NOT APPLICABLE 6 - 22 (calc)   Sodium 140 135 - 146 mmol/L   Potassium 4.2 3.5 - 5.3 mmol/L   Chloride 104 98 - 110 mmol/L   CO2 28 20 - 32 mmol/L   Calcium 9.3 8.6 - 10.4 mg/dL   Total Protein 6.9 6.1 - 8.1 g/dL   Albumin 4.2 3.6 - 5.1 g/dL   Globulin 2.7 1.9 - 3.7 g/dL (calc)   AG Ratio 1.6 1.0 - 2.5 (calc)   Total Bilirubin 0.6 0.2 - 1.2 mg/dL   Alkaline phosphatase (APISO) 45 37 - 153 U/L   AST 13 10 - 35 U/L   ALT 20 6 - 29 U/L      Assessment & Plan:   Problem List Items Addressed This Visit     Postoperative hypothyroidism   Relevant Medications   EUTHYROX 125 MCG tablet   GERD (gastroesophageal reflux disease)   Relevant Medications   famotidine (PEPCID) 40 MG tablet   Other Visit Diagnoses     Annual physical exam    -  Primary   Encounter for screening mammogram for malignant neoplasm of breast       Relevant Orders   MM 3D SCREEN BREAST BILATERAL       Updated Health Maintenance information Updated on Flu Shot. Mammogram ordered, she will call to schedule Reviewed recent lab results with patient Encouraged improvement to lifestyle with diet and exercise Goal of weight loss  Hypothyroidism Controlled on current dose Labs reviewed TSH + T4 Continue refill Euthyrox 1257m daily  GERD Controlled on H2 blocker Re order Famotidine 40 BID  Orders Placed This Encounter  Procedures   MM 3D SCREEN BREAST BILATERAL    Standing Status:   Future    Standing Expiration  Date:   07/03/2022    Order Specific Question:   Reason for Exam (SYMPTOM  OR DIAGNOSIS REQUIRED)    Answer:   Screening bilateral 3D Mammogram Tomo    Order Specific Question:   Preferred imaging location?    Answer:   [facility]      Meds ordered this encounter  Medications   EUTHYROX 125 MCG tablet    Sig: Take 1 tablet (125 mcg total) by mouth daily before breakfast.    Dispense:  90 tablet    Refill:  3   famotidine (PEPCID) 40 MG tablet    Sig: Take 1 tablet (40 mg total) by mouth 2 (two) times daily.    Dispense:  180 tablet    Refill:  3  Follow up plan: Return in about 1 year (around 07/03/2022) for 1 year fasting lab only then 1 week later Annual Physical.  Nobie Putnam, DO [facility] 07/03/2021, 3:53 PM

## 2021-07-14 ENCOUNTER — Ambulatory Visit: Payer: Medicaid Other | Admitting: Family Medicine

## 2021-07-24 ENCOUNTER — Encounter: Payer: Self-pay | Admitting: Family Medicine

## 2021-07-24 ENCOUNTER — Ambulatory Visit: Payer: Medicaid Other | Admitting: Family Medicine

## 2021-07-24 ENCOUNTER — Other Ambulatory Visit: Payer: Self-pay

## 2021-07-24 VITALS — BP 148/74 | HR 82 | Ht 64.0 in | Wt 236.2 lb

## 2021-07-24 DIAGNOSIS — M25512 Pain in left shoulder: Secondary | ICD-10-CM

## 2021-07-24 DIAGNOSIS — M67814 Other specified disorders of tendon, left shoulder: Secondary | ICD-10-CM | POA: Diagnosis not present

## 2021-07-24 DIAGNOSIS — G8929 Other chronic pain: Secondary | ICD-10-CM

## 2021-07-24 MED ORDER — BACLOFEN 5 MG PO TABS: 5.0000 mg | ORAL_TABLET | Freq: Three times a day (TID) | ORAL | 2 refills | Status: DC | PRN

## 2021-07-24 NOTE — Progress Notes (Signed)
Subjective:    Patient ID: Morgan Stevens, female    DOB: [birth year], 55 y.o.   MRN: [id number]  Morgan Stevens is a 55 y.o. female presenting on 07/24/2021 for Shoulder Pain   HPI  Left Shoulder Chronic Pain  Chronic pain since 12/2018 approx 1 year prior to MRI. She has had pain without any known inciting injury. Saw [facility], had L Shoulder MRI. Previously had [facility] PCP - had MRI 12/2019 and was dx as degenerative arthritis of L Shoulder, and advised to take NSAID and Muscle relaxant. She was unsuccessful on this course.  Has not had cortisone injections or other therapy. Insurance ran out and she could not return to [employer].  She has chronic worsening pain in Left shoulder over past 1.5 years, worse with prolonged activity, and overhead activity, usually will feel pain flare up the following day.  Taking Flexeril 62m TID PRN with some benefits but makes her sleepy drowsy Tylenol PRN with some results.  Cervical Spine DDD, nerve impingement Additionally [facility]  TMJ, bilateral Previously mentioned but not formally diagnosed. She describes bilateral jaw pain, Left seems worse than Right. Can radiate most of the time to ear region and contribute to headache. - Can be sporadic, sometimes worse with eating. Wake up with pain as well. Yawn and causes it to pop. - Improved by Muscle relaxant, ice packs help Has not seen Dentist for this Has jaw movement issue with opening and closing   [facility] 07/03/2021 05/16/2021 02/02/2021  Decreased Interest 0 0 0  Down, Depressed, Hopeless 1 1 0  PHQ - 2 Score 1 1 0  Altered sleeping 0 0 -  Tired, decreased energy 1 2 -  Change in appetite 1 2 -  Feeling bad or failure about yourself  1 1 -  Trouble concentrating 0 0 -  Moving slowly or fidgety/restless 0 0 -  Suicidal thoughts 0 0 -  PHQ-9 Score 4 6 -  Difficult doing work/chores Not difficult at all Not difficult at  all -    Social History   Tobacco Use   Smoking status: Never   Smokeless tobacco: Never  Vaping Use   Vaping Use: Never used  Substance Use Topics   Alcohol use: No    Alcohol/week: 0.0 standard drinks   Drug use: No    Review of Systems Per HPI unless specifically indicated above     Objective:    BP (!) 148/74   Pulse 82   Ht 5' 4" (1.626 m)   Wt 236 lb 3.2 oz (107.1 kg)   SpO2 100%   BMI 40.54 kg/m   Wt Readings from Last 3 Encounters:  07/24/21 236 lb 3.2 oz (107.1 kg)  07/03/21 233 lb (105.7 kg)  05/16/21 223 lb 9.6 oz (101.4 kg)    Physical Exam Vitals and nursing note reviewed.  Constitutional:      General: She is not in acute distress.    Appearance: Normal appearance. She is well-developed. She is not diaphoretic.     Comments: Well-appearing, comfortable, cooperative  HENT:     Head: Normocephalic and atraumatic.     Comments: Jaw motion is abnormal with side shifting asymmetrical opening closing. popping Eyes:     General:        Right eye: No discharge.        Left eye: No discharge.     Conjunctiva/sclera: Conjunctivae normal.  Cardiovascular:  Rate and Rhythm: Normal rate.  Pulmonary:     Effort: Pulmonary effort is normal.  Musculoskeletal:     Comments: Left shoulder range of motion limited but some pain with flexion abduction and internal rotation behind back.  Skin:    General: Skin is warm and dry.     Findings: No erythema or rash.  Neurological:     Mental Status: She is alert and oriented to person, place, and time.  Psychiatric:        Mood and Affect: Mood normal.        Behavior: Behavior normal.        Thought Content: Thought content normal.     Comments: Well groomed, good eye contact, normal speech and thoughts    I have personally reviewed the radiology report from 11/21/20 MRI C Spine.  CLINICAL DATA:  Acute onset right upper and lower extremity and right facial numbness.   EXAM: MRI CERVICAL SPINE WITHOUT  CONTRAST   TECHNIQUE: Multiplanar, multisequence MR imaging of the cervical spine was performed. No intravenous contrast was administered.   COMPARISON:  None.   FINDINGS: Alignment: Maintained.   Vertebrae: No fracture, evidence of discitis, or bone lesion.   Cord: Normal signal throughout.   Posterior Fossa, vertebral arteries, paraspinal tissues: Negative.   Disc levels:   C2-3: Negative.   C3-4: A small central disc protrusion just contacts the ventral cord. The foramina are widely patent.   C4-5: Shallow broad-based disc bulge and uncovertebral spurring. The ventral thecal sac is effaced. Mild bilateral foraminal narrowing is more notable on the right.   C5-6: Central disc protrusion indents the ventral cord. The foramina are open.   C6-7: A right paracentral disc protrusion deforms the right hemicord. The foramina are open.   C7-T1: Negative.   IMPRESSION: Central disc protrusion at C5-6 indents the ventral cord.   Right paracentral protrusion at C6-7 deforms the right hemicord.   Small central protrusion at C3-4 contacts the cord without cord deformity.   Shallow disc bulges C4-5 effaces the ventral thecal sac. Mild bilateral foraminal narrowing at this level is worse on the right.     Electronically Signed   By: Inge Rise M.D.   On: 11/21/2020 11:46  -------  I have personally reviewed the radiology report from 01/15/20 MRI L Shoulder.  MRI Upper Extremity Joint Left Wo Contrast  Anatomical Region Laterality Modality  Arm left Magnetic Resonance  Elbow -- --  Wrist -- --  Shoulder -- --   Impression  1. Negative for rotator cuff tear. Mild tendinosis of the infraspinatus.  2. Moderate to severe biceps tendinosis most prominently at the groove entrance.  3. Mild acromioclavicular joint degenerative change. Narrative  EXAM: MRI UPPER EXTREMITY JOINT LEFT WO CONTRAST  DATE: 01/15/2020 8:53 PM  ACCESSION: [id number]  DICTATED:  01/18/2020 9:44 AM  INTERPRETATION LOCATION: [place]   CLINICAL INDICATION: 55 years old Female with chronic left shoulder pain ; Shoulder pain, rotator cuff disorder suspected, xray done  - M25.512 - Chronic left shoulder pain - G89.29 - Chronic left shoulder pain - M75.42 - Impingement syndrome of left shoulder     COMPARISON: 06/15/2019 left shoulder radiographs   TECHNIQUE:  MRI of the left shoulder was performed without contrast using a local coil. Multisequence, multiplanar images were obtained.   FINDINGS:   Coracoacromial arch:  The coracoacromial ligament is normal. Mild degenerative change of the acromioclavicular joint with small joint effusion. Mild undersurface osteophyte formation. There is trace  fluid in the subacromial-subdeltoid bursa.   Rotator cuff:  Negative for rotator cuff tendon tear. There is mild tendinosis of the infraspinatus. Radiology  Rotator cuff muscle bulk is normal.     Moderate to severe some tendinosis of the long head biceps tendon, most pronounced at the groove entrance.   Glenohumeral joint:  On this nonarthrographic examination, the glenoid labrum is intact..     The mild cartilage thinning noted at the mid humeral head and posterior glenoid.   No glenohumeral joint effusion.   The glenohumeral ligaments are normal. Procedure Note  Lyn Records, MD - 01/18/2020  Formatting of this note might be different from the original.  EXAM: MRI UPPER EXTREMITY JOINT LEFT WO CONTRAST  DATE: 01/15/2020 8:53 PM  ACCESSION: [id number]  DICTATED: 01/18/2020 9:44 AM  INTERPRETATION LOCATION: [place]: 55 years old Female with chronic left shoulder pain ; Shoulder pain, rotator cuff disorder suspected, xray done  - M25.512 - Chronic left shoulder pain - G89.29 - Chronic left shoulder pain - M75.42 - Impingement syndrome of left shoulder     COMPARISON: 06/15/2019 left shoulder radiographs   TECHNIQUE:  MRI of the left  shoulder was performed without contrast using a local coil. Multisequence, multiplanar images were obtained.   FINDINGS:   Coracoacromial arch:  The coracoacromial ligament is normal. Mild degenerative change of the acromioclavicular joint with small joint effusion. Mild undersurface osteophyte formation. There is trace fluid in the subacromial-subdeltoid bursa.   Rotator cuff:  Negative for rotator cuff tendon tear. There is mild tendinosis of the infraspinatus. Radiology  Rotator cuff muscle bulk is normal.     Moderate to severe some tendinosis of the long head biceps tendon, most pronounced at the groove entrance.   Glenohumeral joint:  On this nonarthrographic examination, the glenoid labrum is intact..     The mild cartilage thinning noted at the mid humeral head and posterior glenoid.   No glenohumeral joint effusion.   The glenohumeral ligaments are normal.   IMPRESSION:  1. Negative for rotator cuff tear. Mild tendinosis of the infraspinatus.  2. Moderate to severe biceps tendinosis most prominently at the groove entrance.  3. Mild acromioclavicular joint degenerative change. Exam End: 01/15/20 20:53   Specimen Collected: 01/18/20 09:44 Last Resulted: 01/18/20 12:16  Received From: [facility]  Result Received: 07/24/21 15:35     Results for orders placed or performed in visit on 06/26/21  T4, free  Result Value Ref Range   Free T4 1.4 0.8 - 1.8 ng/dL  TSH  Result Value Ref Range   TSH 1.53 mIU/L  Hemoglobin A1c  Result Value Ref Range   Hgb A1c MFr Bld 5.1 <5.7 % of total Hgb   Mean Plasma Glucose 100 mg/dL   eAG (mmol/L) 5.5 mmol/L  CBC with Differential/Platelet  Result Value Ref Range   WBC 6.1 3.8 - 10.8 Thousand/uL   RBC 4.25 3.80 - 5.10 Million/uL   Hemoglobin 13.3 11.7 - 15.5 g/dL   HCT 39.5 35.0 - 45.0 %   MCV 92.9 80.0 - 100.0 fL   MCH 31.3 27.0 - 33.0 pg   MCHC 33.7 32.0 - 36.0 g/dL   RDW 12.7 11.0 - 15.0 %   Platelets 273 140 - 400  Thousand/uL   MPV 10.8 7.5 - 12.5 fL   Neutro Abs 2,001 1,500 - 7,800 cells/uL   Lymphs Abs 3,331 850 - 3,900 cells/uL   Absolute Monocytes 403 200 - 950 cells/uL  Eosinophils Absolute 293 15 - 500 cells/uL   Basophils Absolute 73 0 - 200 cells/uL   Neutrophils Relative % 32.8 %   Total Lymphocyte 54.6 %   Monocytes Relative 6.6 %   Eosinophils Relative 4.8 %   Basophils Relative 1.2 %  Lipid panel  Result Value Ref Range   Cholesterol 131 <200 mg/dL   HDL 48 (L) > OR = 50 mg/dL   Triglycerides 82 <150 mg/dL   LDL Cholesterol (Calc) 67 mg/dL (calc)   Total CHOL/HDL Ratio 2.7 <5.0 (calc)   Non-HDL Cholesterol (Calc) 83 <130 mg/dL (calc)  COMPLETE METABOLIC PANEL WITH GFR  Result Value Ref Range   Glucose, Bld 121 (H) 65 - 99 mg/dL   BUN 11 7 - 25 mg/dL   Creat 0.78 0.50 - 1.03 mg/dL   eGFR 91 > OR = 60 mL/min/1.69m   BUN/Creatinine Ratio NOT APPLICABLE 6 - 22 (calc)   Sodium 140 135 - 146 mmol/L   Potassium 4.2 3.5 - 5.3 mmol/L   Chloride 104 98 - 110 mmol/L   CO2 28 20 - 32 mmol/L   Calcium 9.3 8.6 - 10.4 mg/dL   Total Protein 6.9 6.1 - 8.1 g/dL   Albumin 4.2 3.6 - 5.1 g/dL   Globulin 2.7 1.9 - 3.7 g/dL (calc)   AG Ratio 1.6 1.0 - 2.5 (calc)   Total Bilirubin 0.6 0.2 - 1.2 mg/dL   Alkaline phosphatase (APISO) 45 37 - 153 U/L   AST 13 10 - 35 U/L   ALT 20 6 - 29 U/L      Assessment & Plan:   Problem List Items Addressed This Visit   None Visit Diagnoses     Chronic left shoulder pain    -  Primary   Relevant Medications   baclofen 5 MG TABS   Other Relevant Orders   Ambulatory referral to Orthopedic Surgery   Biceps tendinosis of left shoulder       Relevant Medications   baclofen 5 MG TABS   Other Relevant Orders   Ambulatory referral to Orthopedic Surgery       Chronic L Shoulder Pain 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 Also DJD AC joint. She lost insurance and lost follow-up, previous PCP advised NSAIDs and these have not worked. Now due to return to Orthopedic for further management, L shoulder still limiting her function, may benefit from repeat imaging, PT, injections other medications and procedures.  Orders Placed This Encounter  Procedures   Ambulatory referral to Orthopedic Surgery    Referral Priority:   Routine    Referral Type:   Surgical    Referral Reason:   Specialty Services Required    Requested Specialty:   Orthopedic Surgery    Number of Visits Requested:   1   TMJ Bilateral jaw pain, L>R Chronic problem History most consistent of misalignment of jaw with abnormal motion, worse symptoms overnight when wakes up Advised her to establish with Dentist first and do X-rays and eval for TMJ from their perspective, in meantime she can take the muscle relaxant baclofen and inc Tylenol Future if need we can consider  other meds and referral to PT for TMJ  Meds ordered this encounter  Medications   baclofen 5 MG TABS    Sig: Take 5-10 mg by mouth 3 (three) times daily as needed for muscle spasms.    Dispense:  90 tablet    Refill:  2      Follow up plan: Return if symptoms worsen or fail to improve.   Nobie Putnam, [facility] 07/24/2021, 4:09 PM

## 2021-07-24 NOTE — Patient Instructions (Addendum)
Thank you for coming to the office today.  START anti inflammatory topical - OTC Voltaren (generic Diclofenac) topical 2-4 times a day as needed for pain swelling of affected joint for 1-2 weeks or longer.  Start taking Baclofen (Lioresal) 5mg  (muscle relaxant) - start with 1 pill can double to 2 if prefer up to 3 times a day caution sedation  STOP Flexeril.  Recommend to start taking Tylenol Extra Strength 500mg  tabs - take 1 to 2 tabs per dose (max 1000mg ) every 6-8 hours for pain (take regularly, don't skip a dose for next 7 days), max 24 hour daily dose is 6 tablets or 3000mg . In the future you can repeat the same everyday Tylenol course for 1-2 weeks at a time.  ------  Referral sent to [facility] (same office as Dr for your Neck)  [facility] [address] [place], [place]  [facility] Phone: [phone]   ------------------------------------------------------   [place] [facility] [address], [address] [place], [facility] P: [phone]  [facility] [address] [place], [phone] [facility] [phone]  [place], DDS [address] [address] [place], Irene Limbo [place] [phone]  [facility] [address] [place], [phone] [facility] [phone]  [facility] [address] [place], [phone] [facility] [phone]  [facility] [address] [place], [phone] [facility] [phone]  ----Temporomandibular Joint Syndrome Temporomandibular joint syndrome (TMJ syndrome) is a condition that causes pain in the temporomandibular joints. These joints are located near your ears and allow your jaw to open and close. For people with TMJ syndrome, chewing, biting, or other movements of the jaw can be difficult or painful. TMJ syndrome is often mild and goes away within a few weeks. However, sometimes the  condition becomes a long-term (chronic) problem. What are the causes? This condition may be caused by: Grinding your teeth or clenching your jaw. Some people do this when they are stressed. Arthritis. An injury to the jaw. A head or neck injury. Teeth or dentures that are not aligned well. In some cases, the cause of TMJ syndrome may not be known. What are the signs or symptoms? The most common symptom of this condition is aching pain on the side of the head in the area of the TMJ. Other symptoms may include: Pain when moving your jaw, such as when chewing or biting. Not being able to open your jaw all the way. Making a clicking sound when you open your mouth. Headache. Earache. Neck or shoulder pain. How is this diagnosed? This condition may be diagnosed based on: Your symptoms and medical history. A physical exam. Your health care provider may check the range of motion of your jaw. Imaging tests, such as X-rays or an MRI. You may also need to see your dentist, who will check if your teeth and jaw are lined up correctly. How is this treated? TMJ syndrome often goes away on its own. If treatment is needed, it may include: Eating soft foods and applying ice or heat. Medicines to relieve pain or inflammation. Medicines or massage to relax the muscles. A splint, bite plate, or mouthpiece to prevent teeth grinding or jaw clenching. Relaxation techniques or counseling to help reduce stress. A therapy for pain in which an electrical current is applied to the nerves through the skin (transcutaneous electrical nerve stimulation). Acupuncture. This may help to relieve pain. Jaw surgery. This is rarely needed. Follow these  instructions at home: Eating and drinking Eat a soft diet if you are having trouble chewing. Avoid foods that require a lot of chewing. Do not chew gum. General instructions Take over-the-counter and prescription medicines only as told by your health care provider. If  directed, put ice on the painful area. To do this: Put ice in a plastic bag. Place a towel between your skin and the bag. Leave the ice on for 20 minutes, 2-3 times a day. Remove the ice if your skin turns bright red. This is very important. If you cannot feel pain, heat, or cold, you have a greater risk of damage to the area. Apply a warm, wet cloth (warm compress) to the painful area as told. Massage your jaw area and do any jaw stretching exercises as told by your health care provider. If you were given a splint, bite plate, or mouthpiece, wear it as told by your health care provider. Keep all follow-up visits. This is important. Where to find more information [employer]: [employer] Contact a health care provider if: You have trouble eating. You have new or worsening symptoms. Get help right away if: Your jaw locks. Summary Temporomandibular joint syndrome (TMJ syndrome) is a condition that causes pain in the temporomandibular joints. These joints are located near your ears and allow your jaw to open and close. TMJ syndrome is often mild and goes away within a few weeks. However, sometimes the condition becomes a long-term (chronic) problem. Symptoms include an aching pain on the side of the head in the area of the TMJ, pain when chewing or biting, and being unable to open your jaw all the way. You may also make a clicking sound when you open your mouth. TMJ syndrome often goes away on its own. If treatment is needed, it may include medicines to relieve pain, reduce inflammation, or relax the muscles. A splint, bite plate, or mouthpiece may also be used to prevent teeth grinding or jaw clenching. This information is not intended to replace advice given to you by your health care provider. Make sure you discuss any questions you have with your health care provider. Document Revised: 03/26/2021 Document Reviewed: 03/26/2021 Elsevier Patient  Education  2022 Elsevier Inc.    Please schedule a Follow-up Appointment to: No follow-ups on file.  If you have any other questions or concerns, please feel free to call the office or send a message through MyChart. You may also schedule an earlier appointment if necessary.  Additionally, you may be receiving a survey about your experience at our office within a few days to 1 week by e-mail or mail. We value your feedback.  Saralyn Pilar, DO [facility], [facility]  -------------   Jaw Range of Motion Exercises Jaw range of motion exercises are exercises that help your jaw move better. Exercises that help you have good posture (postural exercises) also help relieve jaw discomfort. These are often done along with range of motion exercises. These exercises can help prevent or improve: Trouble opening your mouth. Pain in your jaw while it is open or closed. Temporomandibular joint (TMJ) pain. Headache caused by jaw tension. Take other actions to prevent or relieve jaw pain, such as: Avoiding things that cause or increase jaw pain. This may include: Chewing gum or eating hard foods. Clenching your jaw or teeth, grinding your teeth, or keeping tension in your jaw muscles. Opening your mouth wide, such as for a big yawn. Leaning on your  jaw, such as resting your jaw in your hand while leaning on a desk. Putting ice on your jaw. To do this: Put ice in a plastic bag. Place a towel between your skin and the bag. Leave the ice on for 20 minutes, 2-3 times a day. Remove the ice if your skin turns bright red. This is very important. If you cannot feel pain, heat, or cold, you have a greater risk of damage to the area. Only do jaw exercises that your health care provider recommends. Only move your jaw as far as it can comfortably go in each direction. Do not move your jaw into positions that cause pain. Range of motion exercises Repeat each of these exercises 8 times, 1-2  times a day, or as told by your health care provider. Exercise A: Forward protrusion  Push your jaw forward. Hold this position for 1-2 seconds. Let your jaw return to its normal position and rest it there for 1-2 seconds. Exercise B: Controlled opening  Stand or sit in front of a mirror. Place your tongue on the roof of your mouth, just behind your top teeth. Keeping your tongue on the roof of your mouth, slowly open and close your mouth. While you open and close your mouth, watch your jaw in the mirror. Try to keep your jaw from moving to one side or the other. Exercise C: Right and left motion  Move your jaw right. Hold this position for 1-2 seconds. Let your jaw return to its normal position, and rest it there for 1-2 seconds. Move your jaw left. Hold this position for 1-2 seconds. Let your jaw return to its normal position, and rest it there for 1-2 seconds.   Contact a health care provider if: You have jaw pain that is new or gets worse. You have clicking or popping sounds while doing the exercises. Get help right away if: Your jaw is stuck in one place and you cannot move it. You cannot open or close your mouth. Summary Jaw range of motion exercises are exercises that help your jaw move better. Take actions to prevent or relieve jaw pain: limit chewing gum or eating hard foods; clenching your jaw or teeth; or leaning on your jaw, such as resting your jaw in your hand while leaning on a desk. Repeat each of the jaw range of motion exercises 8 times, 1-2 times a day, or as told by your health care provider. Contact a health care provider if you have clicking or popping sounds while doing the exercises. This information is not intended to replace advice given to you by your health care provider. Make sure you discuss any questions you have with your health care provider. Document Revised: 03/26/2021 Document Reviewed: 03/26/2021 Elsevier Patient Education  2022 [employer].

## 2021-07-25 ENCOUNTER — Encounter: Payer: Self-pay | Admitting: Family Medicine

## 2021-07-25 DIAGNOSIS — J0101 Acute recurrent maxillary sinusitis: Secondary | ICD-10-CM

## 2021-08-01 ENCOUNTER — Ambulatory Visit: Payer: Medicaid Other | Admitting: Internal Medicine

## 2021-08-07 ENCOUNTER — Telehealth: Payer: Self-pay

## 2021-08-07 NOTE — Telephone Encounter (Signed)
Re-routing this message to [facility] for review. Can you re-submit this one with different label? It looks like orders are correct in Epic.  Thanks!  Saralyn Pilar, DO [facility] 08/07/2021, 4:37 PM

## 2021-08-07 NOTE — Telephone Encounter (Signed)
Copied from CRM [id number]. Topic: Referral - Request for Referral >> Aug 07, 2021  2:43 PM Gaetana Michaelis A wrote: Monse with [facility] has called requesting for referral [phone] to be resubmitted with proper labeling for otolaryngology faxed to [phone]  The referral has been received but it is labeled / referenced for orthopedic surgery   Please contact further if needed

## 2021-08-23 ENCOUNTER — Encounter: Payer: Self-pay | Admitting: Family Medicine

## 2021-08-23 DIAGNOSIS — E7800 Pure hypercholesterolemia, unspecified: Secondary | ICD-10-CM

## 2021-08-23 DIAGNOSIS — I10 Essential (primary) hypertension: Secondary | ICD-10-CM

## 2021-08-24 MED ORDER — AMLODIPINE BESYLATE 2.5 MG PO TABS: 2.5000 mg | ORAL_TABLET | Freq: Every day | ORAL | 3 refills | Status: DC

## 2021-08-24 NOTE — Addendum Note (Signed)
Addended by: [name] on: 08/24/2021 01:39 PM   Modules accepted: Orders

## 2021-08-31 ENCOUNTER — Other Ambulatory Visit: Payer: Self-pay

## 2021-08-31 ENCOUNTER — Emergency Department
Admission: EM | Admit: 2021-08-31 | Discharge: 2021-08-31 | Disposition: A | Discharge status: Home / Self Care | Payer: Medicaid Other | Attending: Emergency Medicine | Admitting: Emergency Medicine

## 2021-08-31 DIAGNOSIS — M62830 Muscle spasm of back: Secondary | ICD-10-CM | POA: Diagnosis not present

## 2021-08-31 DIAGNOSIS — M62838 Other muscle spasm: Secondary | ICD-10-CM

## 2021-08-31 DIAGNOSIS — M545 Low back pain, unspecified: Secondary | ICD-10-CM | POA: Diagnosis not present

## 2021-08-31 LAB — URINALYSIS, COMPLETE (UACMP) WITH MICROSCOPIC
Bacteria, UA: NONE SEEN
Bilirubin Urine: NEGATIVE
Glucose, UA: NEGATIVE mg/dL
Hgb urine dipstick: NEGATIVE
Ketones, ur: NEGATIVE mg/dL
Leukocytes,Ua: NEGATIVE
Nitrite: NEGATIVE
Protein, ur: NEGATIVE mg/dL
Specific Gravity, Urine: 1.013 (ref 1.005–1.030)
pH: 6 (ref 5.0–8.0)

## 2021-08-31 MED ORDER — MELOXICAM 15 MG PO TABS: 15.0000 mg | ORAL_TABLET | Freq: Every day | ORAL | 2 refills | Status: DC

## 2021-08-31 MED ORDER — METHOCARBAMOL 500 MG PO TABS
500.0000 mg | ORAL_TABLET | Freq: Three times a day (TID) | ORAL | 0 refills | Status: AC | PRN
Start: 2021-08-31 — End: 2021-09-05

## 2021-08-31 NOTE — ED Triage Notes (Signed)
Pt to ER via [facility] with complaints of back pain that started this afternoon without injury. Reports pain is present to bilateral flank. Denies urinary symptoms. No hx of kidney stones.

## 2021-08-31 NOTE — Discharge Instructions (Signed)
Take meloxicam and Robaxin as directed. 

## 2021-08-31 NOTE — ED Provider Notes (Signed)
[facility] Provider Note  Patient Contact: 8:30 PM (approximate)   History   Back Pain   HPI  Morgan Stevens is a 56 y.o. female presents to the emergency department with left-sided low back pain the patient states radiates along her left upper back that started today at around 4:00.  Patient denies falls or heavy lifting.  She denies dysuria or hematuria.  She states that she has had pain like this in the past but never this severe.  Denies possibility of pregnancy.  No bowel or bladder incontinence or saddle anesthesia.      Physical Exam   Triage Vital Signs: ED Triage Vitals [08/31/21 1819]  Enc Vitals Group     BP (!) 151/80     Pulse Rate 78     Resp 17     Temp 99 F (37.2 C)     Temp Source Oral     SpO2 98 %     Weight 239 lb (108.4 kg)     Height 5\' 4"  (1.626 m)     Head Circumference      Peak Flow      Pain Score 7     Pain Loc      Pain Edu?      Excl. in [place]?     Most recent vital signs: Vitals:   08/31/21 1819 08/31/21 1953  BP: (!) 151/80 (!) 142/86  Pulse: 78 83  Resp: 17 16  Temp: 99 F (37.2 C) 97.8 F (36.6 C)  SpO2: 98% 100%     General: Alert and in no acute distress. Eyes:  PERRL. EOMI. Head: No acute traumatic findings ENT:      Nose: No congestion/rhinnorhea.      Mouth/Throat: Mucous membranes are moist. Neck: No stridor. No cervical spine tenderness to palpation. Cardiovascular:  Good peripheral perfusion Respiratory: Normal respiratory effort without tachypnea or retractions. Lungs CTAB. Good air entry to the bases with no decreased or absent breath sounds. Gastrointestinal: Bowel sounds 4 quadrants. Soft and nontender to palpation. No guarding or rigidity. No palpable masses. No distention. No CVA tenderness. Musculoskeletal: Full range of motion to all extremities. Patient has paraspinal muscle tenderness along the lumbar spine.  Neurologic:  No gross focal neurologic deficits are appreciated.   Skin:   No rash noted Other:   ED Results / Procedures / Treatments   Labs (all labs ordered are listed, but only abnormal results are displayed) Labs Reviewed  URINALYSIS, COMPLETE (UACMP) WITH MICROSCOPIC - Abnormal; Notable for the following components:      Result Value   Color, Urine YELLOW (*)    APPearance HAZY (*)    All other components within normal limits            IMPRESSION / MDM / ASSESSMENT AND PLAN / ED COURSE  I reviewed the triage vital signs and the nursing notes.                              Differential diagnosis includes, but is not limited to, UTI, pyelonephritis, lumbar strain  Assessment and Plan:  Muscle spasm 56 year old female presents to the emergency department with left-sided paraspinal muscle tenderness along the thoracic and lumbar spine.    Patient was mildly hypertensive at triage but vital signs were otherwise reassuring.  Urinalysis was ordered which shows no signs of UTI.  We will treat with meloxicam and Robaxin for muscle spasm.  Return precautions were given to return with new or worsening symptoms.   FINAL CLINICAL IMPRESSION(S) / ED DIAGNOSES   Final diagnoses:  Muscle spasm     Rx / DC Orders   ED Discharge Orders          Ordered    meloxicam (MOBIC) 15 MG tablet  Daily        08/31/21 2122    methocarbamol (ROBAXIN) 500 MG tablet  Every 8 hours PRN        08/31/21 2122             Note:  This document was prepared using Dragon voice recognition software and may include unintentional dictation errors.   Vallarie Mare [place], Hershal Coria 08/31/21 2127    Nance Pear, MD 08/31/21 2258

## 2021-09-01 ENCOUNTER — Telehealth: Payer: Self-pay

## 2021-09-01 NOTE — Telephone Encounter (Signed)
Transition Care Management Unsuccessful Follow-up Telephone Call  Date of discharge and from where:  08/31/2021-[facility]  Attempts:  1st Attempt  Reason for unsuccessful TCM follow-up call:  Left voice message

## 2021-09-04 NOTE — Telephone Encounter (Signed)
Transition Care Management Follow-up Telephone Call Date of discharge and from where: 08/31/2021-[facility] How have you been since you were released from the hospital? Patient stated she is doing fine.  Any questions or concerns? No  Items Reviewed: Did the pt receive and understand the discharge instructions provided? Yes  Medications obtained and verified? Yes  Other? No  Any new allergies since your discharge? No  Dietary orders reviewed? No Do you have support at home? Yes   Home Care and Equipment/Supplies: Were home health services ordered? not applicable If so, what is the name of the agency? N/A  Has the agency set up a time to come to the patient's home? not applicable Were any new equipment or medical supplies ordered?  No What is the name of the medical supply agency? N/A Were you able to get the supplies/equipment? not applicable Do you have any questions related to the use of the equipment or supplies? No  Functional Questionnaire: (I = Independent and D = Dependent) ADLs: I  Bathing/Dressing- I  Meal Prep- I  Eating- I  Maintaining continence- I  Transferring/Ambulation- I  Managing Meds- I  Follow up appointments reviewed:  PCP Hospital f/u appt confirmed? No   Specialist Hospital f/u appt confirmed? No   Are transportation arrangements needed? No  If their condition worsens, is the pt aware to call PCP or go to the Emergency Dept.? Yes Was the patient provided with contact information for the PCP's office or ED? Yes Was to pt encouraged to call back with questions or concerns? Yes

## 2021-09-11 ENCOUNTER — Telehealth: Payer: Medicaid Other | Admitting: Family Medicine

## 2021-09-11 ENCOUNTER — Encounter: Payer: Self-pay | Admitting: Family Medicine

## 2021-09-11 VITALS — Wt 239.0 lb

## 2021-09-11 DIAGNOSIS — R053 Chronic cough: Secondary | ICD-10-CM

## 2021-09-11 DIAGNOSIS — J3089 Other allergic rhinitis: Secondary | ICD-10-CM

## 2021-09-11 DIAGNOSIS — J9801 Acute bronchospasm: Secondary | ICD-10-CM

## 2021-09-11 DIAGNOSIS — M549 Dorsalgia, unspecified: Secondary | ICD-10-CM

## 2021-09-11 MED ORDER — ALBUTEROL SULFATE HFA 108 (90 BASE) MCG/ACT IN AERS: 2.0000 | INHALATION_SPRAY | RESPIRATORY_TRACT | 2 refills | Status: DC | PRN

## 2021-09-11 MED ORDER — PREDNISONE 20 MG PO TABS: ORAL_TABLET | ORAL | 0 refills | Status: DC

## 2021-09-11 MED ORDER — MONTELUKAST SODIUM 10 MG PO TABS: 10.0000 mg | ORAL_TABLET | Freq: Every day | ORAL | 3 refills | Status: DC

## 2021-09-11 MED ORDER — GABAPENTIN 100 MG PO CAPS: ORAL_CAPSULE | ORAL | 1 refills | Status: DC

## 2021-09-11 NOTE — Patient Instructions (Addendum)
° °  [facility]   Address: [address] [place], [place], [place] [address] Phone: [phone]  If they cannot get you in for nutrition then we would consider this next option in [facility]  Dr Quillian Quince  [facility] [address] [place] [address] [place], [place] [address] Ph: [phone]  Please schedule a Follow-up Appointment to: Return if symptoms worsen or fail to improve.  If you have any other questions or concerns, please feel free to call the office or send a message through MyChart. You may also schedule an earlier appointment if necessary.  Additionally, you may be receiving a survey about your experience at our office within a few days to 1 week by e-mail or mail. We value your feedback.  Saralyn Pilar, DO [facility] [place]

## 2021-09-11 NOTE — Progress Notes (Signed)
Subjective:    Patient ID: Morgan Stevens, female    DOB: 1966/08/22, 56 y.o.   MRN: [id number]  Morgan Stevens is a 56 y.o. female presenting on 09/11/2021 for Obesity and Back Pain  Virtual / Telehealth Encounter - Video Visit via MyChart The purpose of this virtual visit is to provide medical care while limiting exposure to the novel coronavirus (COVID19) for both patient and office staff.  Consent was obtained for remote visit:  Yes.   Answered questions that patient had about telehealth interaction:  Yes.   I discussed the limitations, risks, security and privacy concerns of performing an evaluation and management service by video/telephone. I also discussed with the patient that there may be a patient responsible charge related to this service. The patient expressed understanding and agreed to proceed.  Patient Location: Home Provider Location: [facility] (Office)  Participants in virtual visit: - Patient: Morgan Stevens - CMA: Burnell Blanks, CMA - Provider: Dr Althea Charon   HPI  Mid Back Pain Chronic cough bronchospasm Illness in past 4+ weeks, has had chronic cough now with worse at night tightness wheezing noisy cough. No dx of asthma Visit to ED 1/5, given meloxicam and robaxin, says robaxin not helpful. No back imaging Known C-spine w/ Neurosurgery She has previous baclofen med, meloxicam  Morbid Obesity BMI >41 She has tried wt loss diet with keto with some success up to 30 lb loss but not able to sustain keto diet. She is interested in weight loss nutrition lifestyle diet changes   Depression screen [facility] 2/9 07/03/2021 05/16/2021 02/02/2021  Decreased Interest 0 0 0  Down, Depressed, Hopeless 1 1 0  PHQ - 2 Score 1 1 0  Altered sleeping 0 0 -  Tired, decreased energy 1 2 -  Change in appetite 1 2 -  Feeling bad or failure about yourself  1 1 -  Trouble concentrating 0 0 -  Moving slowly or fidgety/restless 0 0 -  Suicidal thoughts 0 0 -  PHQ-9  Score 4 6 -  Difficult doing work/chores Not difficult at all Not difficult at all -    Social History   Tobacco Use   Smoking status: Never   Smokeless tobacco: Never  Vaping Use   Vaping Use: Never used  Substance Use Topics   Alcohol use: No    Alcohol/week: 0.0 standard drinks   Drug use: No    Review of Systems Per HPI unless specifically indicated above     Objective:    Wt 239 lb (108.4 kg)    BMI 41.02 kg/m   Wt Readings from Last 3 Encounters:  09/11/21 239 lb (108.4 kg)  08/31/21 239 lb (108.4 kg)  07/24/21 236 lb 3.2 oz (107.1 kg)    Physical Exam  Note examination was completely remotely via video observation objective data only  Gen - well-appearing, no acute distress or apparent pain, comfortable HEENT - eyes appear clear without discharge or redness Heart/Lungs - cannot examine virtually - frequent coughing with tight wheezing with cough. Speaks full sentences most of the time. Not increased work of breathing. Abd - cannot examine virtually  Skin - face visible today- no rash Neuro - awake, alert, oriented Psych - not anxious appearing   Results for orders placed or performed during the hospital encounter of 08/31/21  Urinalysis, Complete w Microscopic Urine, Clean Catch  Result Value Ref Range   Color, Urine YELLOW (A) YELLOW   APPearance HAZY (A) CLEAR  Specific Gravity, Urine 1.013 1.005 - 1.030   pH 6.0 5.0 - 8.0   Glucose, UA NEGATIVE NEGATIVE mg/dL   Hgb urine dipstick NEGATIVE NEGATIVE   Bilirubin Urine NEGATIVE NEGATIVE   Ketones, ur NEGATIVE NEGATIVE mg/dL   Protein, ur NEGATIVE NEGATIVE mg/dL   Nitrite NEGATIVE NEGATIVE   Leukocytes,Ua NEGATIVE NEGATIVE   RBC / HPF 0-5 0 - 5 RBC/hpf   WBC, UA 0-5 0 - 5 WBC/hpf   Bacteria, UA NONE SEEN NONE SEEN   Squamous Epithelial / LPF 0-5 0 - 5   Mucus PRESENT       Assessment & Plan:   Problem List Items Addressed This Visit     Morbid obesity (HCC)   Relevant Orders   Amb ref to  Medical Nutrition Therapy-MNT   Allergic rhinitis due to allergen   Relevant Medications   montelukast (SINGULAIR) 10 MG tablet   Other Visit Diagnoses     Bronchospasm, acute    -  Primary   Relevant Medications   predniSONE (DELTASONE) 20 MG tablet   albuterol (VENTOLIN HFA) 108 (90 Base) MCG/ACT inhaler   montelukast (SINGULAIR) 10 MG tablet   Chronic cough       Relevant Medications   albuterol (VENTOLIN HFA) 108 (90 Base) MCG/ACT inhaler   montelukast (SINGULAIR) 10 MG tablet   Mid back pain on left side       Relevant Medications   predniSONE (DELTASONE) 20 MG tablet   gabapentin (NEURONTIN) 100 MG capsule       Morbid Obesity BMI >41 difficulty obtaining weight loss, has tried keto diet with some success in past, difficulty with sustaining weight loss diet and approach [facility] to provider in [place], declined for now Defer medication route today as well. Limited coverage on these newer meds with Medicaid. Reconsider if needed  Back Pain, L sided mid Chronic known C-spine symptoms, followed by Neurosurgery / Ortho Has not had mid back or low back imaging Recent ED visit with cough having some back pain worsening thought due to chronic cough  Will treat cough today maybe helps her back pain, seems L sided mid back - mostly strain type symptoms Failed Robaxin, restart Baclofen PRN Prednisone taper 7 days. Temporarily hold oral NSAID. Then can Continue NSAID meloxicam PRN Add Gabapentin titration PM dosing for now.  Chronic cough / bronchospasm Today cough on video seems like she is wheezing Unresolved from initial inciting respiratory infection nearly 4+ weeks ago Will order prednisone x 1 week Rx Rescue albuterol inhaler PRN Rx Singulair nightly  Orders Placed This Encounter  Procedures   Amb ref to Medical Nutrition Therapy-MNT    Referral Priority:   Routine    Referral Type:    Consultation    Referral Reason:   Specialty Services Required    Requested Specialty:   Nutrition    Number of Visits Requested:   1     Meds ordered this encounter  Medications   predniSONE (DELTASONE) 20 MG tablet    Sig: Take daily with food. Start with 60mg  (3 pills) x 2 days, then reduce to 40mg  (2 pills) x 2 days, then 20mg  (1 pill) x 3 days    Dispense:  13 tablet    Refill:  0   albuterol (VENTOLIN HFA) 108 (90 Base) MCG/ACT inhaler    Sig: Inhale 2 puffs into the lungs every 4 (four) hours as needed for wheezing or  shortness of breath.    Dispense:  6.7 g    Refill:  2   montelukast (SINGULAIR) 10 MG tablet    Sig: Take 1 tablet (10 mg total) by mouth at bedtime.    Dispense:  90 tablet    Refill:  3   gabapentin (NEURONTIN) 100 MG capsule    Sig: Start 1 capsule daily at bedtime, increase by 1 cap every 2-3 days as tolerated up to 3 doses = 300mg  at bedtime    Dispense:  90 capsule    Refill:  1     Follow up plan: Return if symptoms worsen or fail to improve.   Patient verbalizes understanding with the above medical recommendations including the limitation of remote medical advice.  Specific follow-up and call-back criteria were given for patient to follow-up or seek medical care more urgently if needed.  Total duration of direct patient care provided via video conference: 15 minutes   , DO [facility] 09/11/2021, 11:50 AM

## 2021-09-13 DIAGNOSIS — J301 Allergic rhinitis due to pollen: Secondary | ICD-10-CM | POA: Diagnosis not present

## 2021-09-13 DIAGNOSIS — J329 Chronic sinusitis, unspecified: Secondary | ICD-10-CM | POA: Diagnosis not present

## 2021-09-13 DIAGNOSIS — J309 Allergic rhinitis, unspecified: Secondary | ICD-10-CM | POA: Diagnosis not present

## 2021-09-13 DIAGNOSIS — R0981 Nasal congestion: Secondary | ICD-10-CM | POA: Diagnosis not present

## 2021-09-27 DIAGNOSIS — M25512 Pain in left shoulder: Secondary | ICD-10-CM | POA: Diagnosis not present

## 2021-09-27 DIAGNOSIS — M67922 Unspecified disorder of synovium and tendon, left upper arm: Secondary | ICD-10-CM | POA: Diagnosis not present

## 2021-09-27 DIAGNOSIS — M7522 Bicipital tendinitis, left shoulder: Secondary | ICD-10-CM | POA: Diagnosis not present

## 2021-09-29 ENCOUNTER — Encounter: Payer: Self-pay | Admitting: Family Medicine

## 2021-10-02 ENCOUNTER — Emergency Department: Payer: Medicaid Other

## 2021-10-02 ENCOUNTER — Other Ambulatory Visit: Payer: Self-pay

## 2021-10-02 ENCOUNTER — Emergency Department
Admission: EM | Admit: 2021-10-02 | Discharge: 2021-10-02 | Disposition: A | Discharge status: Home / Self Care | Payer: Medicaid Other | Attending: Emergency Medicine | Admitting: Emergency Medicine

## 2021-10-02 DIAGNOSIS — R002 Palpitations: Secondary | ICD-10-CM | POA: Insufficient documentation

## 2021-10-02 DIAGNOSIS — R0602 Shortness of breath: Secondary | ICD-10-CM | POA: Diagnosis not present

## 2021-10-02 DIAGNOSIS — R079 Chest pain, unspecified: Secondary | ICD-10-CM

## 2021-10-02 DIAGNOSIS — R0600 Dyspnea, unspecified: Secondary | ICD-10-CM | POA: Diagnosis not present

## 2021-10-02 DIAGNOSIS — R0789 Other chest pain: Secondary | ICD-10-CM | POA: Diagnosis not present

## 2021-10-02 DIAGNOSIS — R069 Unspecified abnormalities of breathing: Secondary | ICD-10-CM | POA: Diagnosis not present

## 2021-10-02 DIAGNOSIS — I959 Hypotension, unspecified: Secondary | ICD-10-CM | POA: Diagnosis not present

## 2021-10-02 LAB — CBC
HCT: 41.6 % (ref 36.0–46.0)
Hemoglobin: 14.5 g/dL (ref 12.0–15.0)
MCH: 31 pg (ref 26.0–34.0)
MCHC: 34.9 g/dL (ref 30.0–36.0)
MCV: 88.9 fL (ref 80.0–100.0)
Platelets: 413 10*3/uL — ABNORMAL HIGH (ref 150–400)
RBC: 4.68 MIL/uL (ref 3.87–5.11)
RDW: 12.6 % (ref 11.5–15.5)
WBC: 11.9 10*3/uL — ABNORMAL HIGH (ref 4.0–10.5)
nRBC: 0 % (ref 0.0–0.2)

## 2021-10-02 LAB — BASIC METABOLIC PANEL
Anion gap: 8 (ref 5–15)
BUN: 20 mg/dL (ref 6–20)
CO2: 25 mmol/L (ref 22–32)
Calcium: 9.6 mg/dL (ref 8.9–10.3)
Chloride: 105 mmol/L (ref 98–111)
Creatinine, Ser: 0.86 mg/dL (ref 0.44–1.00)
GFR, Estimated: >60 mL/min (ref >60)
Glucose, Bld: 114 mg/dL — ABNORMAL HIGH (ref 70–99)
Potassium: 3.7 mmol/L (ref 3.5–5.1)
Sodium: 138 mmol/L (ref 135–145)

## 2021-10-02 LAB — TROPONIN I (HIGH SENSITIVITY)
Troponin I (High Sensitivity): 4 ng/L (ref <18)
Troponin I (High Sensitivity): 5 ng/L (ref <18)

## 2021-10-02 LAB — T4, FREE: Free T4: 1.24 ng/dL — ABNORMAL HIGH (ref 0.61–1.12)

## 2021-10-02 LAB — TSH: TSH: 1.018 u[IU]/mL (ref 0.350–4.500)

## 2021-10-02 IMAGING — CR DG CHEST 2V
2 series · 2 of 2 positions shown · non-contrast
Comparison: None.

CLINICAL DATA: Dyspnea

EXAM:
CHEST - 2 VIEW

[chest pa]
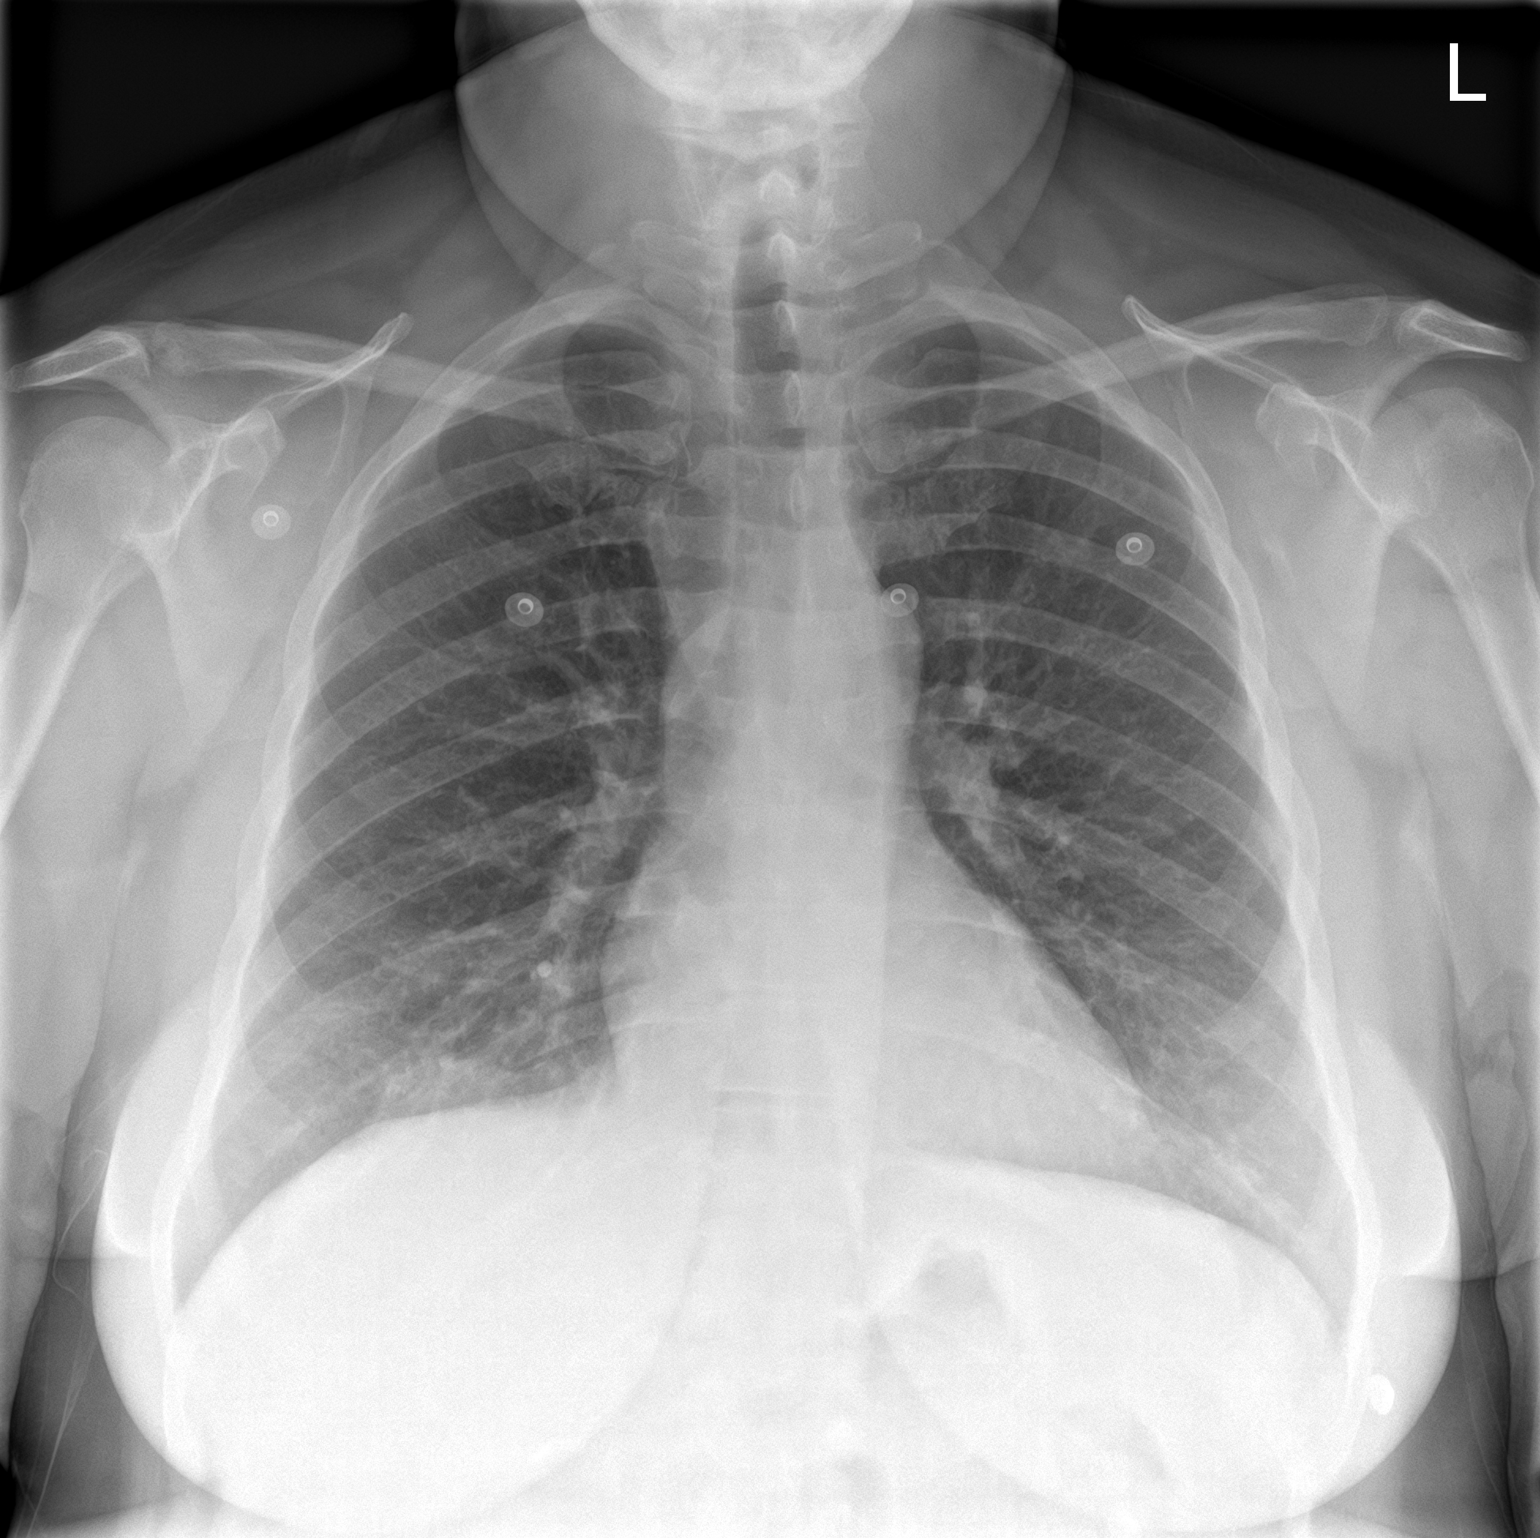

[chest lat]
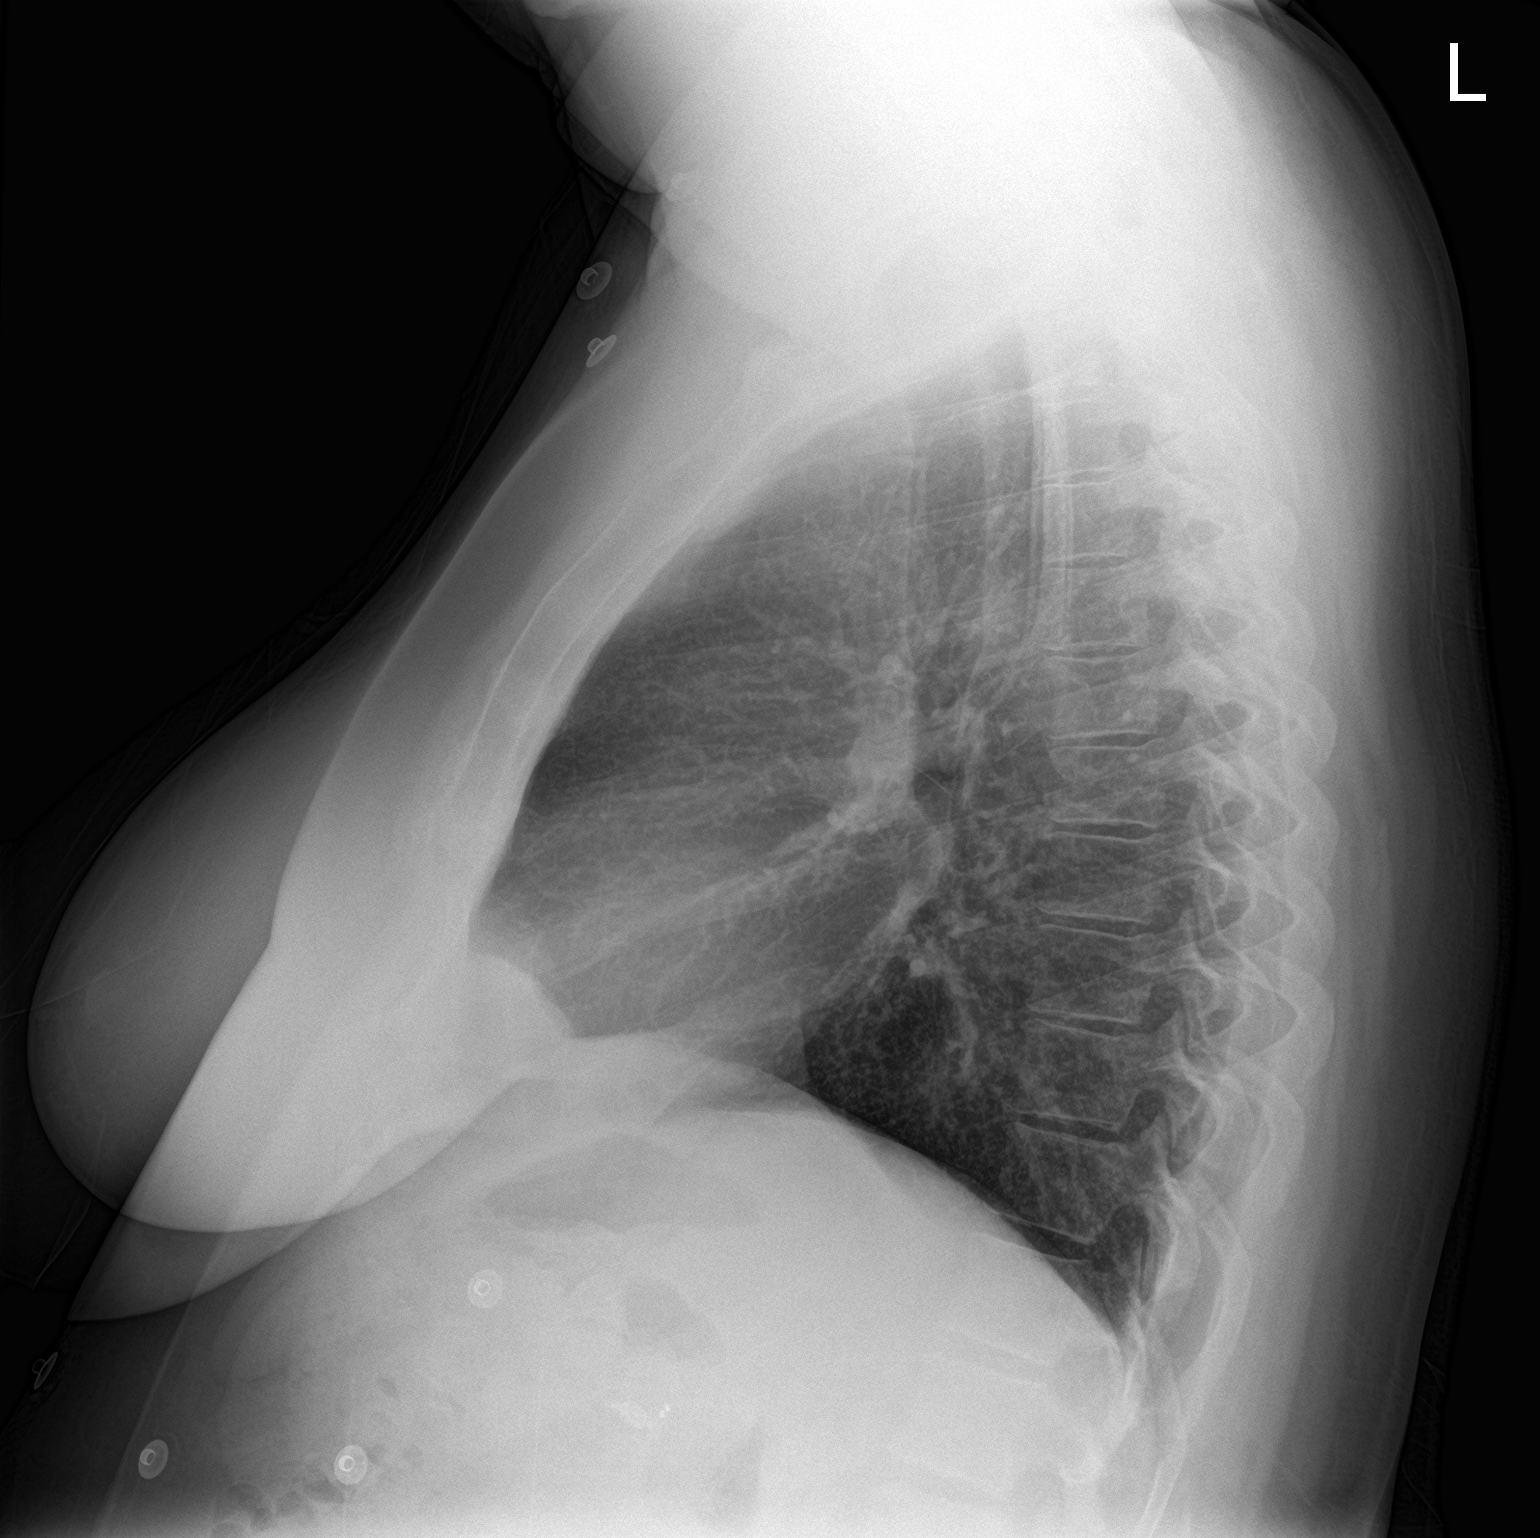

[2 of 2 positions shown; findings below may reference images not displayed]

FINDINGS: The heart size and mediastinal contours are within normal limits.
Both lungs are clear. The visualized skeletal structures are
unremarkable.
IMPRESSION: No active cardiopulmonary disease.

## 2021-10-02 NOTE — ED Triage Notes (Signed)
Pt presents via EMS, c/o SOB while walking to car. Reports heart started racing at that time. Reports still having SOB at present moment.

## 2021-10-02 NOTE — ED Provider Notes (Signed)
[facility] Provider Note    Event Date/Time   First MD Initiated Contact with Patient 10/02/21 2055     (approximate)   History   Shortness of Breath   HPI  Morgan Stevens is a 56 y.o. female with a history of obesity, conversion disorder, GERD, hiatal hernia, Schatzki's ring of distal esophagus who comes ED complaining of palpitations and a feeling of shortness of breath while walking to her car this evening at about 7:30 PM.  Constant, no aggravating or alleviating factors.  Not exertional, not pleuritic.  No diaphoresis or vomiting, nonradiating.  Reports on arrival to the ED that symptoms have significantly improved and she has only mild discomfort in the chest currently.  No fever, no trauma.     Physical Exam   Triage Vital Signs: ED Triage Vitals  Enc Vitals Group     BP 10/02/21 2025 134/77     Pulse Rate 10/02/21 2025 79     Resp 10/02/21 2025 (!) 22     Temp 10/02/21 2025 99.1 F (37.3 C)     Temp Source 10/02/21 2025 Oral     SpO2 10/02/21 2025 98 %     Weight 10/02/21 2026 233 lb (105.7 kg)     Height 10/02/21 2026 5\' 4"  (1.626 m)     Head Circumference --      Peak Flow --      Pain Score 10/02/21 2026 0     Pain Loc --      Pain Edu? --      Excl. in [place]? --     Most recent vital signs: Vitals:   10/02/21 2025 10/02/21 2145  BP: 134/77 128/64  Pulse: 79 64  Resp: (!) 22 17  Temp: 99.1 F (37.3 C)   SpO2: 98% 98%     General: Awake, no distress.  CV:  Good peripheral perfusion.  Regular rate and rhythm Resp:  Normal effort.  Clear to auscultation bilaterally Abd:  No distention.  Soft and nontender Other:  No lower extremity edema.  No JVD   ED Results / Procedures / Treatments   Labs (all labs ordered are listed, but only abnormal results are displayed) Labs Reviewed  CBC - Abnormal; Notable for the following components:      Result Value   WBC 11.9 (*)    Platelets 413 (*)    All other components within  normal limits  BASIC METABOLIC PANEL - Abnormal; Notable for the following components:   Glucose, Bld 114 (*)    All other components within normal limits  T4, FREE - Abnormal; Notable for the following components:   Free T4 1.24 (*)    All other components within normal limits  TSH  TROPONIN I (HIGH SENSITIVITY)  TROPONIN I (HIGH SENSITIVITY)     EKG Interpreted by me Normal sinus rhythm, heart rate of 74.  Normal axis intervals QRS ST segments and T waves.  No evidence of underlying dysrhythmia or ischemia.   RADIOLOGY Chest x-ray viewed and interpreted by me, appears normal.  Radiology report reviewed    PROCEDURES:  Critical Care performed: No  Procedures   MEDICATIONS ORDERED IN ED: Medications - No data to display   IMPRESSION / MDM / [place] / ED COURSE  I reviewed the triage vital signs and the nursing notes.  Differential diagnosis includes, but is not limited to, hypothyroidism, non-STEMI, GERD, pneumothorax, pneumonia    Patient presents with atypical chest discomfort and shortness of breath.  I believe symptoms are noncardiac.  Vitals, chest x-ray and EKG are unremarkable.  Labs including serial troponin are normal.  TSH is normal.  Patient does not require admission due to improving symptoms and reassuring work-up. Considering the patient's symptoms, medical history, and physical examination today, I have low suspicion for ACS, PE, TAD, pneumothorax, carditis, mediastinitis, pneumonia, CHF, or sepsis.  Stable for discharge home to continue monitoring symptoms.  Return precautions discussed.     FINAL CLINICAL IMPRESSION(S) / ED DIAGNOSES   Final diagnoses:  Nonspecific chest pain     Rx / DC Orders   ED Discharge Orders     None        Note:  This document was prepared using Dragon voice recognition software and may include unintentional dictation errors.   Carrie Mew, MD 10/02/21 [phone]

## 2021-10-02 NOTE — Discharge Instructions (Signed)
Your EKG, chest x-ray, and lab tests were all normal today.  Please follow-up with your doctor for continued evaluation of your symptoms.

## 2021-10-03 ENCOUNTER — Encounter: Payer: Self-pay | Admitting: Family Medicine

## 2021-10-03 ENCOUNTER — Telehealth: Payer: Self-pay | Admitting: *Deleted

## 2021-10-03 ENCOUNTER — Ambulatory Visit: Payer: Medicaid Other | Admitting: Family Medicine

## 2021-10-03 VITALS — BP 151/83 | HR 71 | Ht 64.0 in | Wt 237.6 lb

## 2021-10-03 DIAGNOSIS — R0789 Other chest pain: Secondary | ICD-10-CM | POA: Diagnosis not present

## 2021-10-03 DIAGNOSIS — R002 Palpitations: Secondary | ICD-10-CM

## 2021-10-03 NOTE — Progress Notes (Signed)
Subjective:    Patient ID: Morgan Stevens, female    DOB: 1966-04-03, 56 y.o.   MRN: [id number]  Morgan Stevens is a 56 y.o. female presenting on 10/03/2021 for Shortness of Breath   HPI  ED FOLLOW-UP VISIT  Hospital/Location: [facility] Date of ED Visit: 10/02/21  Reason for Presenting to ED: Chest Pain, Dyspnea, Weakness  FOLLOW-UP  - ED provider note and record have been reviewed - Patient presents today about 1 day after recent ED visit. Brief summary of recent course, patient had symptoms of acute onset sudden spontaneous chest pain and heaviness in mid chest, heart palpitation racing, no sweating, no nausea vomiting, she felt dyspnea and winded without any known cause, it was spontaneous after normal walk to her car, without significant exertion, she called 911 and EMS took her to ED, testing in ED with EKG compared to 10/2020 that was normal and unchanged, BMET and CBC mostly unremarkable, had abnormal T4 mild 1.24 according to the hospital reference range, but previously 06/2021 using different reference it was actually still normal, also had negative troponin delta x 2, CXR negative. Discharged with improved symptoms  Yesterday had higher BP but improved when discharged Elevated BP today 150. No significant fam history of heart disease  GERD, Gastritis / Esophagitis Hiatal Hernia Continues to follow with [facility] Dr Bonna Gains, has had EGD / Colonoscopy in recent history Taking Famotidine 40mg  BID with good results Has Maalox PRN as well  - Today reports overall has done well after discharge from ED. Symptoms of chest pain pressure have resolved    I have reviewed the discharge medication list, and have reconciled the current and discharge medications today.   Depression screen [facility] 2/9 10/03/2021 07/03/2021 05/16/2021  Decreased Interest 0 0 0  Down, Depressed, Hopeless 0 1 1  PHQ - 2 Score 0 1 1  Altered sleeping 1 0 0  Tired, decreased energy 2 1 2   Change in appetite 0 1 2  Feeling  bad or failure about yourself  0 1 1  Trouble concentrating 0 0 0  Moving slowly or fidgety/restless 0 0 0  Suicidal thoughts 0 0 0  PHQ-9 Score 3 4 6   Difficult doing work/chores Not difficult at all Not difficult at all Not difficult at all    Social History   Tobacco Use   Smoking status: Never   Smokeless tobacco: Never  Vaping Use   Vaping Use: Never used  Substance Use Topics   Alcohol use: No    Alcohol/week: 0.0 standard drinks   Drug use: No    Review of Systems Per HPI unless specifically indicated above     Objective:    BP (!) 151/83    Pulse 71    Ht 5\' 4"  (1.626 m)    Wt 237 lb 9.6 oz (107.8 kg)    SpO2 99%    BMI 40.78 kg/m   Wt Readings from Last 3 Encounters:  10/03/21 237 lb 9.6 oz (107.8 kg)  10/02/21 233 lb (105.7 kg)  09/11/21 239 lb (108.4 kg)    Physical Exam Vitals and nursing note reviewed.  Constitutional:      General: She is not in acute distress.    Appearance: She is well-developed. She is not diaphoretic.     Comments: Well-appearing, comfortable, cooperative  HENT:     Head: Normocephalic and atraumatic.  Eyes:     General:        Right eye: No discharge.  Left eye: No discharge.     Conjunctiva/sclera: Conjunctivae normal.  Neck:     Thyroid: No thyromegaly.  Cardiovascular:     Rate and Rhythm: Normal rate and regular rhythm.     Heart sounds: Normal heart sounds. No murmur heard. Pulmonary:     Effort: Pulmonary effort is normal. No respiratory distress.     Breath sounds: Normal breath sounds. No wheezing or rales.  Musculoskeletal:        General: Normal range of motion.     Cervical back: Normal range of motion and neck supple.     Right lower leg: No tenderness.     Left lower leg: No tenderness.  Lymphadenopathy:     Cervical: No cervical adenopathy.  Skin:    General: Skin is warm and dry.     Findings: No erythema or rash.  Neurological:     Mental Status: She is alert and oriented to person, place, and  time.  Psychiatric:        Behavior: Behavior normal.     Comments: Well groomed, good eye contact, normal speech and thoughts    I have personally reviewed the radiology report.  ADDENDUM REPORT: 12/24/2019 16:37   EXAM: OVER-READ INTERPRETATION  CT CHEST   The following report is an over-read performed by radiologist Dr. Collene Leyden [facility], [place] on 12/24/2019. This over-read does not include interpretation of cardiac or coronary anatomy or pathology. The coronary CTA interpretation by the cardiologist is attached.   COMPARISON:  04/10/2019   FINDINGS: Heart is normal size. Aorta is normal caliber. No adenopathy in the lower mediastinum or hila. Subpleural nodule in the left lower lobe measures 3 mm. 2-3 mm nodule in the left lower lobe on image 30. These are stable when compared to prior study. No confluent opacities or effusions. Diffuse fatty infiltration of the liver suspected. Chest wall soft tissues are unremarkable. No acute bony abnormality.   IMPRESSION: 2-3 mm nodules in the left lower lobe. These have been stable dating back to March 2020. No follow-up needed if patient is low-risk (and has no known or suspected primary neoplasm). Non-contrast chest CT can be considered in 12 months if patient is high-risk. This recommendation follows the consensus statement: Guidelines for Management of Incidental Pulmonary Nodules Detected on CT Images: From the Fleischner Society 2017; Radiology 2017; 284:228-243.     Electronically Signed   By: Rolm Baptise M.D.   On: 12/24/2019 16:37    Addended by Rolm Baptise, MD on 12/24/2019  4:39 PM   Study Result  Narrative & Impression  CLINICAL DATA:  Hx of angina and dyspnea   EXAM: Cardiac/Coronary  CTA   TECHNIQUE: The patient was scanned on a Siemens Somatoform go.Top scanner.   FINDINGS: A retrospective scan was triggered in the descending thoracic aorta. Axial non-contrast 3 mm slices were carried  out through the heart. The data set was analyzed on a dedicated work station and scored using the [place]. Gantry rotation speed was 330 msecs and collimation was .6 mm. 100mg  of metoprolol and 0.8 mg of sl NTG was given. The 3D data set was reconstructed in 5% intervals of the 45-95 % of the R-R cycle. Diastolic phases were analyzed on a dedicated work station using MPR, MIP and VRT modes. The patient received 125 cc of contrast.   Aorta:  Normal size.  No calcifications.  No dissection.   Aortic Valve:  Trileaflet.  No calcifications.   Coronary Arteries:  Normal  coronary origin.  Right dominance.   RCA is a large dominant artery that gives rise to PDA and PLA. There is no plaque.   Left main is a large artery that gives rise to LAD and LCX arteries.   LAD is a large vessel that has no plaque.   LCX is a non-dominant artery that gives rise to one large OM1 branch. There is no plaque.   Other findings:   Normal pulmonary vein drainage into the left atrium.   Normal left atrial appendage without a thrombus.   Normal size of the pulmonary artery.   IMPRESSION: 1. Coronary calcium score of 0. Patient is low risk for near term coronary events   2. Normal coronary origin with right dominance.   3. No evidence of CAD.   4. CAD-RADS 0. Consider non-atherosclerotic causes of chest pain.   Electronically Signed: By: Kate Sable M.D. On: 12/24/2019 14:36    ECHOCARDIOGRAM COMPLETEPerformed 01/05/2020 Final result  Study Result  ECHOCARDIOGRAM REPORT     Patient Name: JULIANN INACIO Date of Exam: 01/05/2020 Medical Rec #: [id number] Height: 64.0 in Accession #: [id number] Weight: 221.0 lb Date of Birth: October 09, 1965 BSA: 2.041 m Patient Age: 31 years BP: 134/80 mmHg Patient Gender: F HR: 69 bpm. Exam Location: [facility]  Procedure: 2D Echo, Cardiac Doppler and Color Doppler  Indications: R06.02 SOB; R07.89 Other chest pain  History: Patient has no  prior history of Echocardiogram examinations. Signs/Symptoms:Shortness of Breath and Chest Pain; Risk Factors:Non-Smoker. GERD.  Sonographer: Pilar Jarvis RDMS, RVT, RDCS Referring Phys: [id number] BRIAN AGBOR-ETANG  IMPRESSIONS   1. Left ventricular ejection fraction, by estimation, is 55 to 60%. The left ventricle has normal function. The left ventricle has no regional wall motion abnormalities. There is mild left ventricular hypertrophy. Left ventricular diastolic parameters  are consistent with Grade I diastolic dysfunction (impaired relaxation). 2. Right ventricular systolic function is normal. The right ventricular size is normal. There is normal pulmonary artery systolic pressure. 3. The mitral valve is normal in structure. Mild mitral valve regurgitation. No evidence of mitral stenosis. 4. The aortic valve was not well visualized. Aortic valve regurgitation is not visualized. No aortic stenosis is present. 5. The inferior vena cava is normal in size with greater than 50% respiratory variability, suggesting right atrial pressure of 3 mmHg.  FINDINGS Left Ventricle: Left ventricular ejection fraction, by estimation, is 55 to 60%. The left ventricle has normal function. The left ventricle has no regional wall motion abnormalities. The left ventricular internal cavity size was normal in size. There is mild left ventricular hypertrophy. Left ventricular diastolic parameters are consistent with Grade I diastolic dysfunction (impaired relaxation).  Right Ventricle: The right ventricular size is normal. No increase in right ventricular wall thickness. Right ventricular systolic function is normal. There is normal pulmonary artery systolic pressure. The tricuspid regurgitant velocity is 2.35 m/s, and with an assumed right atrial pressure of 3 mmHg, the estimated right ventricular systolic pressure is [id number] mmHg.  Left Atrium: Left atrial size was normal in size.  Right Atrium: Right atrial size  was normal in size.  Pericardium: There is no evidence of pericardial effusion.  Mitral Valve: The mitral valve is normal in structure. Mild mitral valve regurgitation. No evidence of mitral valve stenosis.  Tricuspid Valve: The tricuspid valve is not well visualized. Tricuspid valve regurgitation is trivial. No evidence of tricuspid stenosis.  Aortic Valve: The aortic valve was not well visualized. Aortic valve regurgitation is not visualized. No aortic stenosis is  present. Aortic valve mean gradient measures 3.0 mmHg. Aortic valve peak gradient measures 5.7 mmHg. Aortic valve area, by VTI  measures 2.61 cm.  Pulmonic Valve: The pulmonic valve was not well visualized. Pulmonic valve regurgitation is trivial. No evidence of pulmonic stenosis.  Aorta: The aortic root and ascending aorta are structurally normal, with no evidence of dilitation.  Pulmonary Artery: The pulmonary artery is not well seen.  Venous: The inferior vena cava is normal in size with greater than 50% respiratory variability, suggesting right atrial pressure of 3 mmHg.  IAS/Shunts: No atrial level shunt detected by color flow Doppler.   LEFT VENTRICLE PLAX 2D LVIDd: 4.10 cm Diastology LVIDs: 3.00 cm LV e' lateral: 10.40 cm/s LV PW: 0.90 cm LV E/e' lateral: 6.4 LV IVS: 1.10 cm LV e' medial: 36.02 cm/s LVOT diam: 2.00 cm LV E/e' medial: 1.9 LV SV: 64 LV SV Index: 32 LVOT Area: 3.14 cm  LV Volumes (MOD) LV vol d, MOD A2C: 70.3 ml LV vol d, MOD A4C: 91.6 ml LV vol s, MOD A2C: 34.9 ml LV vol s, MOD A4C: 43.8 ml LV SV MOD A2C: 35.4 ml LV SV MOD A4C: 91.6 ml LV SV MOD BP: 41.4 ml  RIGHT VENTRICLE IVC RV Basal diam: 3.90 cm IVC diam: 1.50 cm RV S prime: 11.20 cm/s TAPSE (M-mode): 2.4 cm  LEFT ATRIUM Index RIGHT ATRIUM Index LA diam: 3.70 cm 1.81 cm/m RA Area: 11.60 cm LA Vol (A2C): 31.9 ml 15.63 ml/m RA Volume: 23.30 ml 11.41 ml/m LA Vol (A4C): 33.6 ml 16.46 ml/m LA Biplane Vol: 32.9 ml 16.12  ml/m AORTIC VALVE PULMONIC VALVE AV Area (Vmax): 2.45 cm PV Vmax: 0.78 m/s AV Area (Vmean): 2.89 cm PV Peak grad: 2.5 mmHg AV Area (VTI): 2.61 cm AV Vmax: 119.00 cm/s AV Vmean: 77.400 cm/s AV VTI: 0.247 m AV Peak Grad: 5.7 mmHg AV Mean Grad: 3.0 mmHg LVOT Vmax: 92.90 cm/s LVOT Vmean: 71.200 cm/s LVOT VTI: 0.205 m LVOT/AV VTI ratio: 0.83  AORTA Ao Root diam: 2.40 cm Ao Asc diam: 2.80 cm  MITRAL VALVE TRICUSPID VALVE MV Area (PHT): 3.00 cm TR Peak grad: 22.1 mmHg MV Decel Time: 253 msec TR Vmax: 235.00 cm/s MV E velocity: 66.80 cm/s MV A velocity: 87.50 cm/s SHUNTS MV E/A ratio: 0.76 Systemic VTI: 0.20 m Systemic Diam: 2.00 cm  Nelva Bush MD Electronically signed by Nelva Bush MD Signature Date/Time: 01/05/2020/2:59:41 PM    Results for orders placed or performed during the hospital encounter of 10/02/21  CBC  Result Value Ref Range   WBC 11.9 (H) 4.0 - 10.5 K/uL   RBC 4.68 3.87 - 5.11 MIL/uL   Hemoglobin 14.5 12.0 - 15.0 g/dL   HCT 41.6 36.0 - 46.0 %   MCV 88.9 80.0 - 100.0 fL   MCH 31.0 26.0 - 34.0 pg   MCHC 34.9 30.0 - 36.0 g/dL   RDW 12.6 11.5 - 15.5 %   Platelets 413 (H) 150 - 400 K/uL   nRBC 0.0 0.0 - 0.2 %  Basic metabolic panel  Result Value Ref Range   Sodium 138 135 - 145 mmol/L   Potassium 3.7 3.5 - 5.1 mmol/L   Chloride 105 98 - 111 mmol/L   CO2 25 22 - 32 mmol/L   Glucose, Bld 114 (H) 70 - 99 mg/dL   BUN 20 6 - 20 mg/dL   Creatinine, Ser 0.86 0.44 - 1.00 mg/dL   Calcium 9.6 8.9 - 10.3 mg/dL   GFR, Estimated >60 >60  mL/min   Anion gap 8 5 - 15  TSH  Result Value Ref Range   TSH 1.018 0.350 - 4.500 uIU/mL  T4, free  Result Value Ref Range   Free T4 1.24 (H) 0.61 - 1.12 ng/dL  Troponin I (High Sensitivity)  Result Value Ref Range   Troponin I (High Sensitivity) 4 <18 ng/L  Troponin I (High Sensitivity)  Result Value Ref Range   Troponin I (High Sensitivity) 5 <18 ng/L      Assessment & Plan:   Problem List Items Addressed  This Visit   None Visit Diagnoses     Atypical chest pain    -  Primary   Intermittent palpitations           Detailed review today of chronic problem dating back 5+ years or longer, multiple ED UC visits, prior Cardiology consultation and work up, ultimately no clear etiology identified. No evidence of CAD or ischemic disease Previoius consideration for costochdronitis vs GERD / HIatal hernia She is controlled on H2 blocker currently w/o acid reflux symptoms  Last Cardology consult was 2021  Today with minimal symptoms.  Still May be costochondritis chest wall inflammation START anti inflammatory topical - OTC Voltaren (generic Diclofenac) topical 2-4 times a day as needed for pain swelling of affected joint for 1-2 weeks or longer.  Less likely to be acid reflux.  Can be possible esophageal spasm diff dx  Considering heart palpitations causing this - will ask your heart doctor about it, may consider new medication such as Metoprolol or Diltiazem as needed or daily. Or consider return for heart monitor testing if we believe palpitations are underlying cause   No orders of the defined types were placed in this encounter.     Follow up plan: Return if symptoms worsen or fail to improve.  Message Cardiologist Dr Garen Lah about future Heart Monitor for palpitations, and possible BB vs CCB PRN vs low dose?   Nobie Putnam, [facility] 10/03/2021, 3:23 PM

## 2021-10-03 NOTE — Patient Instructions (Addendum)
Thank you for coming to the office today.  So far seems unlikely to be heart cause such as blockage  May be costochondritis chest wall inflammation START anti inflammatory topical - OTC Voltaren (generic Diclofenac) topical 2-4 times a day as needed for pain swelling of affected joint for 1-2 weeks or longer.  Less likely to be acid reflux.  Can be possible esophageal spasm May need new rx med for this  Considering heart palpitations causing this - will ask your heart doctor about it, may consider new medication such as Metoprolol or Diltiazem as needed or daily.  He may recommend a heart monitor test. Stay tuned.   Please schedule a Follow-up Appointment to: Return if symptoms worsen or fail to improve.  If you have any other questions or concerns, please feel free to call the office or send a message through MyChart. You may also schedule an earlier appointment if necessary.  Additionally, you may be receiving a survey about your experience at our office within a few days to 1 week by e-mail or mail. We value your feedback.  Saralyn Pilar, DO [facility], [place]

## 2021-10-03 NOTE — Telephone Encounter (Signed)
Transition Care Management Follow-up Telephone Call Date of discharge and from where: 10/02/2021 [facility] ED How have you been since you were released from the hospital? "Feeling better" Any questions or concerns? No  Items Reviewed: Did the pt receive and understand the discharge instructions provided? Yes  Medications obtained and verified?  N/A Other? No  Any new allergies since your discharge? No  Dietary orders reviewed? No Do you have support at home? Yes    Functional Questionnaire: (I = Independent and D = Dependent) ADLs: I  Bathing/Dressing- I  Meal Prep- I  Eating- I  Maintaining continence- I  Transferring/Ambulation- I  Managing Meds- I  Follow up appointments reviewed:  PCP Hospital f/u appt confirmed? Yes  Scheduled to see Dr. Althea Charon on 10/03/2021 @ 1440. Specialist Hospital f/u appt confirmed? No   Are transportation arrangements needed? No  If their condition worsens, is the pt aware to call PCP or go to the Emergency Dept.? Yes Was the patient provided with contact information for the PCP's office or ED? Yes Was to pt encouraged to call back with questions or concerns? Yes

## 2021-10-06 ENCOUNTER — Encounter: Payer: Self-pay | Admitting: Family Medicine

## 2021-10-10 DIAGNOSIS — R0981 Nasal congestion: Secondary | ICD-10-CM | POA: Diagnosis not present

## 2021-10-10 DIAGNOSIS — J329 Chronic sinusitis, unspecified: Secondary | ICD-10-CM | POA: Diagnosis not present

## 2021-10-10 DIAGNOSIS — J309 Allergic rhinitis, unspecified: Secondary | ICD-10-CM | POA: Diagnosis not present

## 2021-10-13 DIAGNOSIS — J301 Allergic rhinitis due to pollen: Secondary | ICD-10-CM | POA: Diagnosis not present

## 2021-10-17 DIAGNOSIS — R0683 Snoring: Secondary | ICD-10-CM | POA: Diagnosis not present

## 2021-10-17 DIAGNOSIS — R202 Paresthesia of skin: Secondary | ICD-10-CM | POA: Diagnosis not present

## 2021-10-17 DIAGNOSIS — M159 Polyosteoarthritis, unspecified: Secondary | ICD-10-CM | POA: Diagnosis not present

## 2021-10-17 DIAGNOSIS — M25512 Pain in left shoulder: Secondary | ICD-10-CM | POA: Diagnosis not present

## 2021-11-01 ENCOUNTER — Ambulatory Visit
Admission: RE | Admit: 2021-11-01 | Discharge: 2021-11-01 | Disposition: A | Discharge status: Home / Self Care | Payer: Medicaid Other | Source: Ambulatory Visit | Attending: Family Medicine | Admitting: Family Medicine

## 2021-11-01 ENCOUNTER — Other Ambulatory Visit: Payer: Self-pay

## 2021-11-01 DIAGNOSIS — Z1231 Encounter for screening mammogram for malignant neoplasm of breast: Secondary | ICD-10-CM | POA: Diagnosis not present

## 2021-11-01 IMAGING — MG MM DIGITAL SCREENING BILAT W/ TOMO AND CAD
6 of 10 series · 6 of 30 positions shown · non-contrast
Comparison: Previous exam(s).

ACR Breast Density Category a: The breast tissue is almost entirely
fatty.

CLINICAL DATA: Screening. /

EXAM:
DIGITAL SCREENING BILATERAL MAMMOGRAM WITH TOMOSYNTHESIS AND CAD
TECHNIQUE: Bilateral screening digital craniocaudal and mediolateral oblique
mammograms were obtained. Bilateral screening digital breast
tomosynthesis was performed. The images were evaluated with
computer-aided detection.

[R MLO synth-2D (1 of 2)]
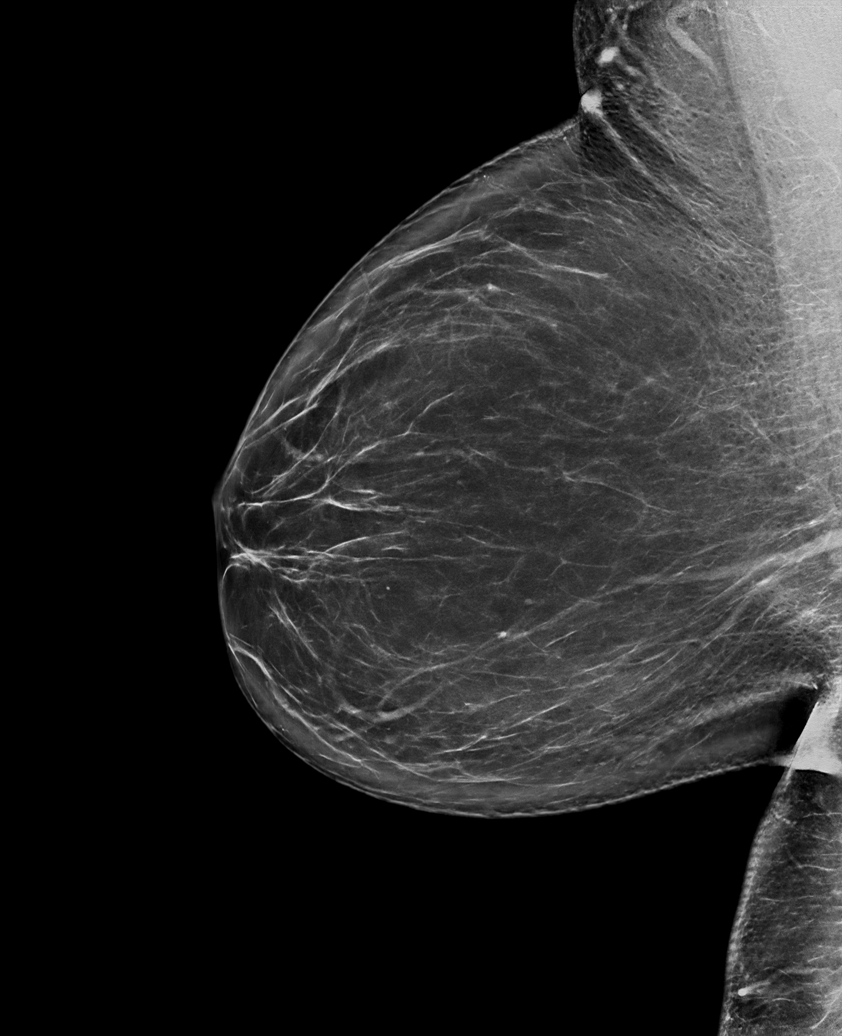

[R MLO synth-2D (2 of 2)]
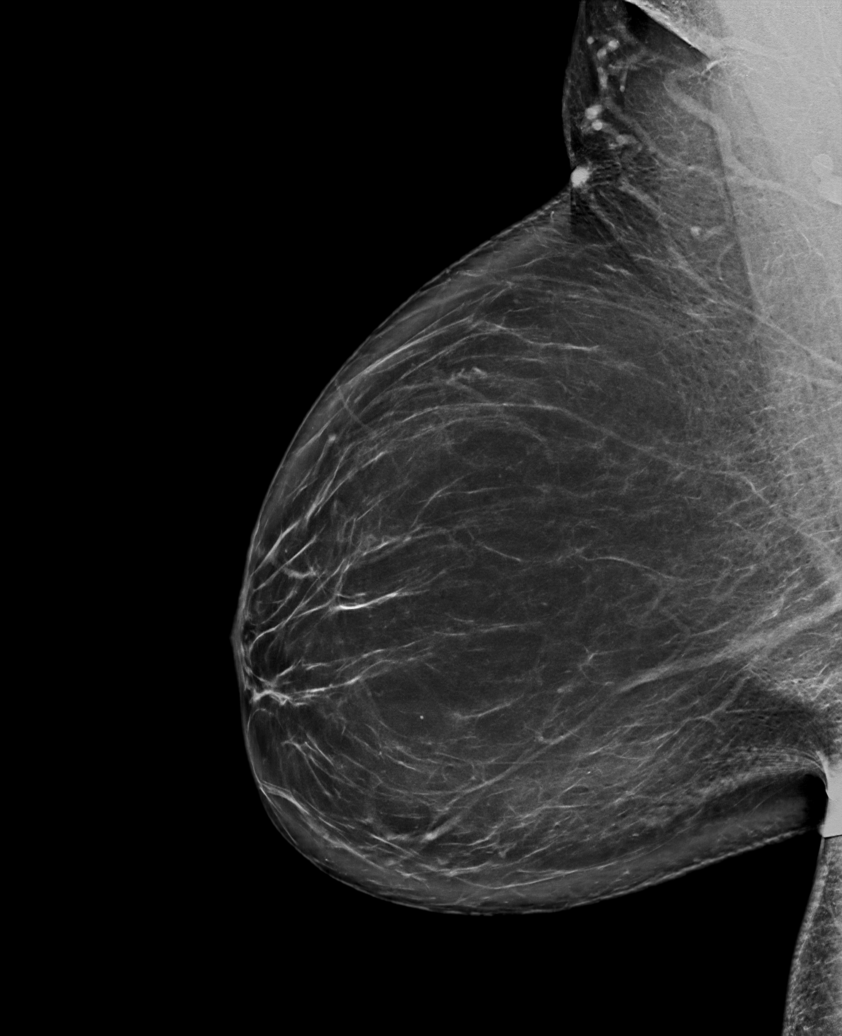

[L MLO synth-2D]
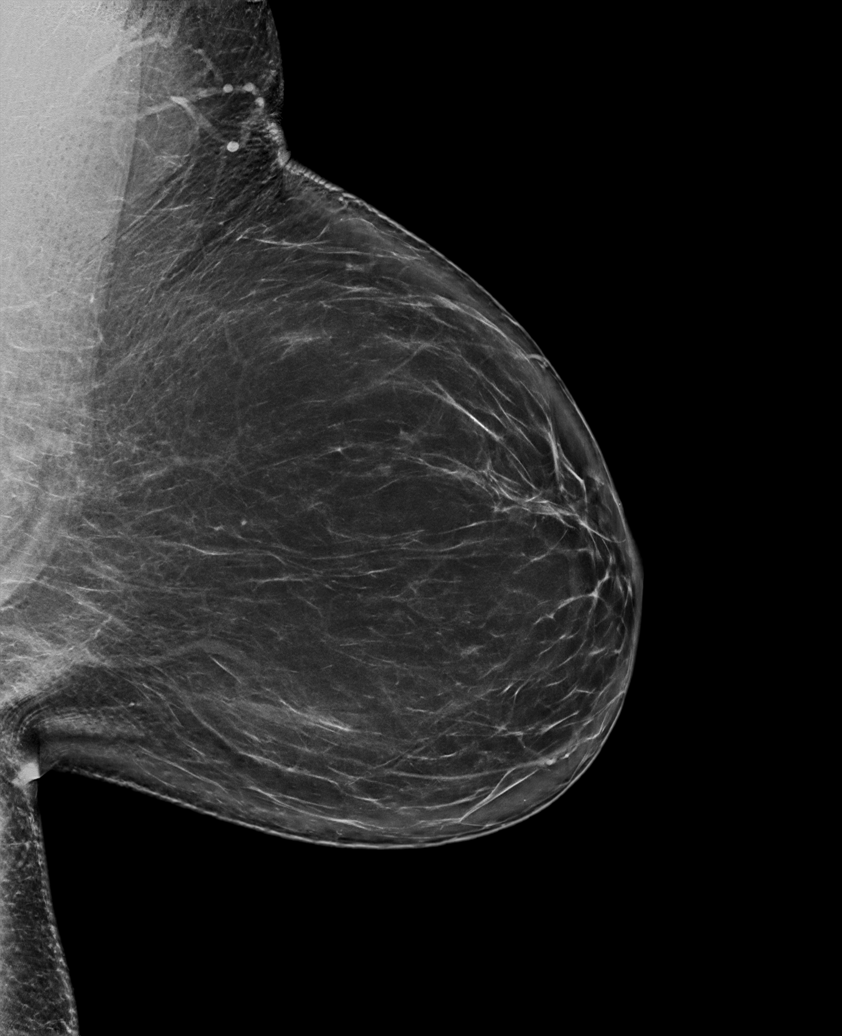

[R CC synth-2D]
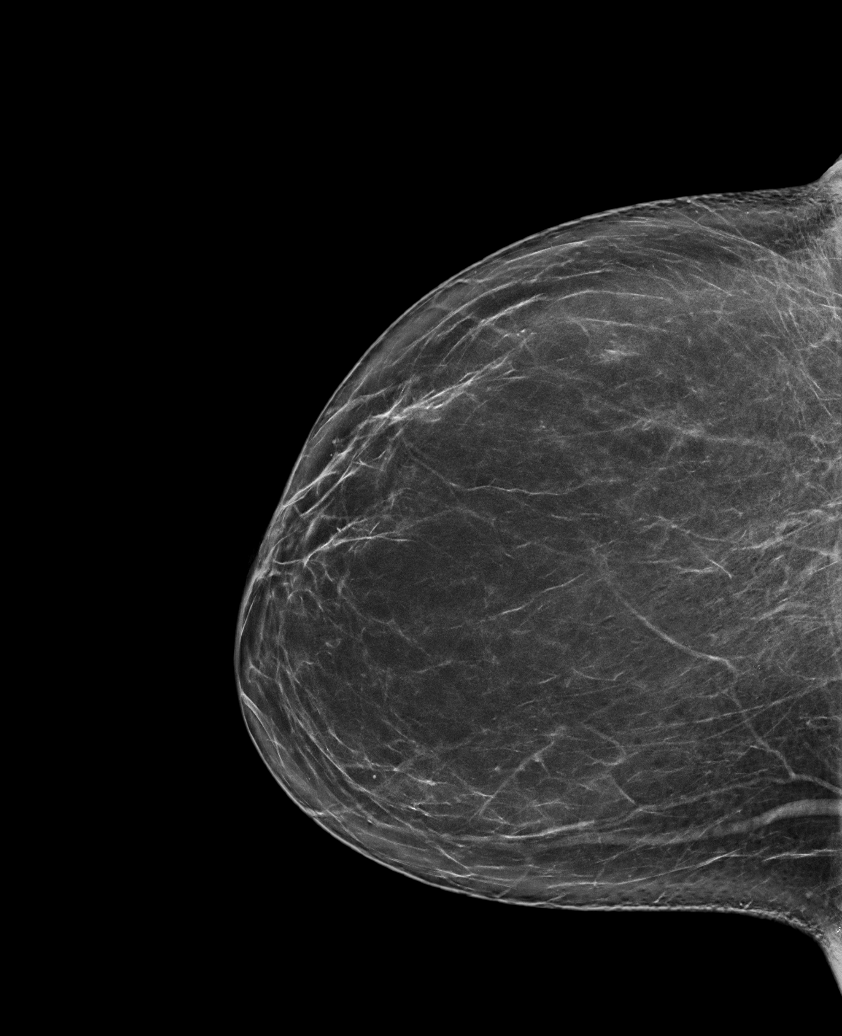

[L CC synth-2D]
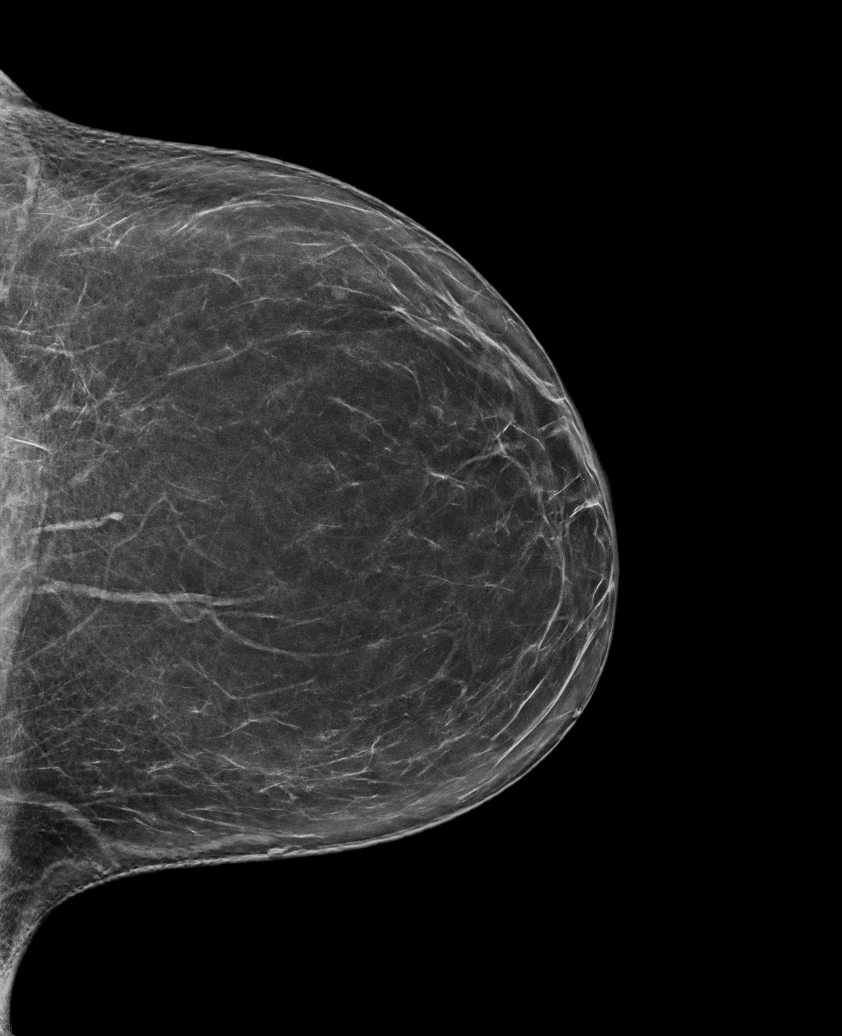

[R MLO tomo · tomo slice 47/92.0]
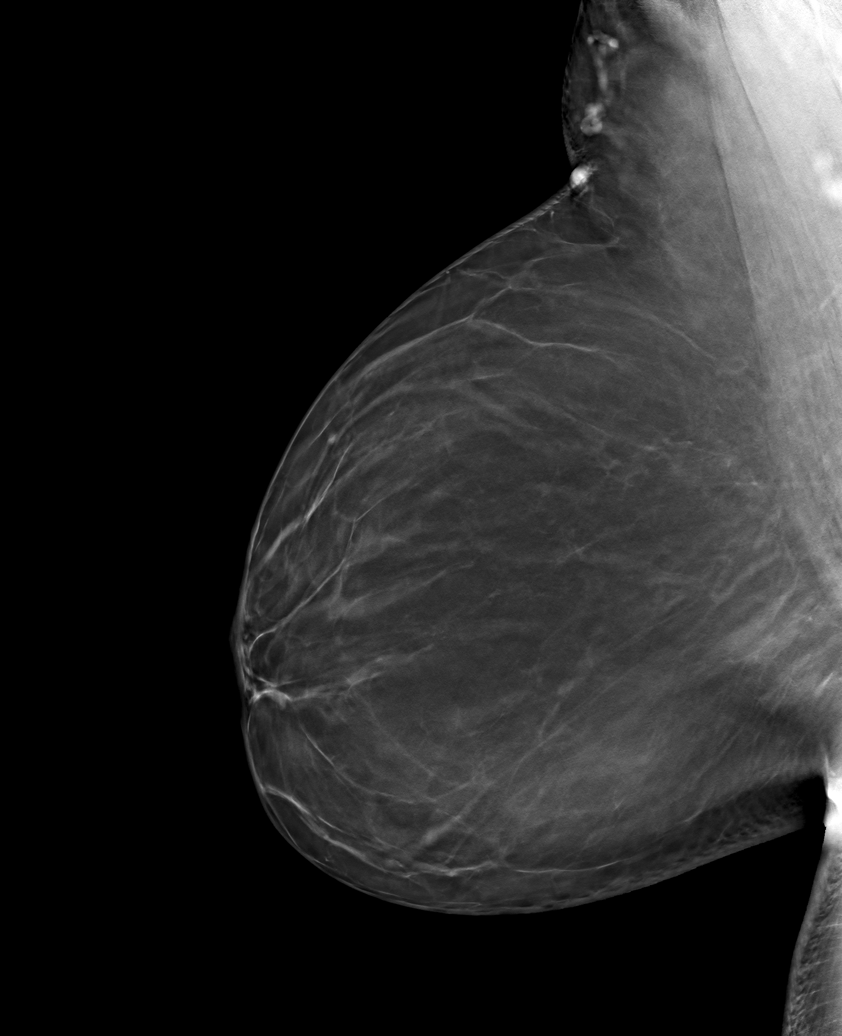

[6 of 30 positions shown; findings below may reference images not displayed]

FINDINGS: There are no findings suspicious for malignancy.
IMPRESSION: No mammographic evidence of malignancy. A result letter of this
screening mammogram will be mailed directly to the patient.

RECOMMENDATION:
Screening mammogram in one year. (Code:[17])

BI-RADS CATEGORY  1: Negative.

## 2021-11-02 DIAGNOSIS — M4802 Spinal stenosis, cervical region: Secondary | ICD-10-CM | POA: Diagnosis not present

## 2021-11-02 DIAGNOSIS — M5412 Radiculopathy, cervical region: Secondary | ICD-10-CM | POA: Diagnosis not present

## 2021-11-09 ENCOUNTER — Other Ambulatory Visit: Payer: Self-pay

## 2021-11-09 ENCOUNTER — Encounter: Payer: Medicaid Other | Attending: Family Medicine | Admitting: Dietician

## 2021-11-09 VITALS — Ht 64.0 in | Wt 243.3 lb

## 2021-11-09 DIAGNOSIS — Z713 Dietary counseling and surveillance: Secondary | ICD-10-CM | POA: Insufficient documentation

## 2021-11-09 DIAGNOSIS — Z6841 Body Mass Index (BMI) 40.0 and over, adult: Secondary | ICD-10-CM | POA: Insufficient documentation

## 2021-11-09 NOTE — Patient Instructions (Addendum)
Change snacks to healthier options most of the time; use fruit or a whole grain and combine with a protein food such as nuts, peanut butter or low fat cheese for a satisfying and carb/ calorie-controlled option. ?Plan to eat a meal or snack every 3-5 hours during the day. Include a light lunch or balanced snack midday.  ?Control portions of starches like pasta and rice while increasing portions of low carb veggies. Ideally keep to 1 cup (fist size) or less for any one meal. ?

## 2021-11-09 NOTE — Progress Notes (Signed)
Medical Nutrition Therapy: Visit start time: 1350  end time: 1450  ?Assessment:  Diagnosis: obesity ?Past medical history: HTN, hyperlipidemia, GERD, gastritis, esophagitis, hiatal hernia, allergy induced asthma ?Psychosocial issues/ stress concerns: reports moderate stress level, but feels she is not dealing well with her stress. ? ?Preferred learning method:  ?Visual ?Hands-on ? ? ?Current weight: 243.3lbs  Height: 5'4" BMI: 41.76 ? ?Medications, supplements: reconciled list in medical record ? ?Progress and evaluation:  ?Patient reports increase recently, previous high weight was 230lbs. She feels weight gain is due to intake of sweets. ?She previously lost about 20lbs on keto diet, but unable to continue with the diet restrictions.  ?She reports ongoing symptoms of GERD; takes otc antacid in additional to famotidine. She is hoping that weight loss will help improve symptoms ?Recent labs indicate: total cholesterol 131, HDL 48, LDL 67, triglycerides 82; VWP7X 5.8% (06/27/21) ?She is dealing with chronic pain in back shoulders, neck, as well as feet.  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 ? ?Physical activity: no regular exercise at this time; ADLs include home chores and child care ? ?Dietary Intake:  ?Usual eating pattern includes 2 meals and 3 snacks per day. ?Dining out frequency: 1-2 meals per month. ? ?Breakfast: sometimes skips; cereal; yogurt; occ eggs; doughnut or cookies ?Snack: none ?Lunch: usually skips ?Snack: "junk" candy bar, snack cake ?Supper: pasta; burgers; chicken; loves rice and pasta + veg (not enough per patient)  ?Snack: usually something sweet ?Beverages: water; no sodas or juices in the home (5 kids in home); tea with sugar; coffee only rarely ? ?Nutrition Care Education: ?Topics covered:  ?Basic nutrition: basic food groups, appropriate nutrient balance, appropriate meal and snack schedule, general nutrition guidelines     ?Weight control: importance of low sugar and low fat choices; portion control strategies including using smaller plates, increasing low carb vegetables and plating first, estimating appropriate food portions; estimated energy needs for weight loss at 1300 kcal, provided guidance for 45% CHO. 25% pro, 30% fat; discussed role of physical activity ?Hypertension: identifying food sources of potassium, magnesium ?Hyperlipidemia:  healthy and unhealthy fats, importance of adequate vegetable and fruit intake ?GERD: choosing low fat foods; avoiding large volumes of food at any one time in favor of smaller, more frequent meals and snacks; avoiding/ limiting caffeine, chocolate, mint; sitting upright or standing after eating ? ? ?Nutritional Diagnosis:  [place]-1.4 Altered GI function As related to GERD and esophagitis.  As evidenced by patient reported symptoms and use of antacid medications. ?[place]-3.3 Overweight/obesity As related to excess calories, inadequate physical activity, hypothyroidism.  As evidenced by patient with current BMI of 41.76. ? ?Intervention:  ?Instruction and discussion as noted above. ?Patient voices readiness to make dietary changes for weight loss. She hopes to improve GI symptoms and pain. ?Established goals for change with direction from patient.  ? ?Insurance underwriter given:  ?Plate Planner with food lists, sample meal pattern ?Snacking handout ?GERD nutrition therapy (NCM) ?Visit summary with goals/ instructions ? ? ?Learner/ who was taught:  ?Patient  ? ?Level of understanding: ?Verbalizes/ demonstrates competency ? ? ?Demonstrated degree of understanding via:   Teach back ?Learning barriers: ?None ? ?Willingness to learn/ readiness for change: ?Eager, change in progress ? ?Monitoring and Evaluation:  Dietary intake, exercise, GI symptoms, BP and blood lipids, and body weight ?     follow up:  12/21/21 at 1:15pm   ?

## 2021-11-13 ENCOUNTER — Other Ambulatory Visit: Payer: Self-pay

## 2021-11-13 ENCOUNTER — Emergency Department
Admission: EM | Admit: 2021-11-13 | Discharge: 2021-11-13 | Disposition: A | Discharge status: Home / Self Care | Payer: Medicaid Other | Attending: Emergency Medicine | Admitting: Emergency Medicine

## 2021-11-13 DIAGNOSIS — R221 Localized swelling, mass and lump, neck: Secondary | ICD-10-CM | POA: Insufficient documentation

## 2021-11-13 DIAGNOSIS — T7840XA Allergy, unspecified, initial encounter: Secondary | ICD-10-CM | POA: Insufficient documentation

## 2021-11-13 DIAGNOSIS — Z79899 Other long term (current) drug therapy: Secondary | ICD-10-CM | POA: Diagnosis not present

## 2021-11-13 DIAGNOSIS — I10 Essential (primary) hypertension: Secondary | ICD-10-CM | POA: Diagnosis not present

## 2021-11-13 DIAGNOSIS — Z7982 Long term (current) use of aspirin: Secondary | ICD-10-CM | POA: Diagnosis not present

## 2021-11-13 DIAGNOSIS — L299 Pruritus, unspecified: Secondary | ICD-10-CM | POA: Insufficient documentation

## 2021-11-13 DIAGNOSIS — J301 Allergic rhinitis due to pollen: Secondary | ICD-10-CM | POA: Diagnosis not present

## 2021-11-13 MED ORDER — DEXAMETHASONE SODIUM PHOSPHATE 10 MG/ML IJ SOLN
10.0000 mg | Freq: Once | INTRAMUSCULAR | Status: AC
Start: 2021-11-13 — End: 2021-11-13
  Administered 2021-11-13: 10 mg via INTRAMUSCULAR
  Filled 2021-11-13: qty 1

## 2021-11-13 MED ORDER — DIPHENHYDRAMINE HCL 25 MG PO CAPS
25.0000 mg | ORAL_CAPSULE | Freq: Once | ORAL | Status: AC
  Administered 2021-11-13: 25 mg via ORAL
  Filled 2021-11-13: qty 1

## 2021-11-13 NOTE — ED Triage Notes (Signed)
Patient to ER via POV with complaints of an itchy mouth and throat. No obvious hives/ rash noted but patient frequently clearing throat. States both her tongue and lips feel numb. Reports having an allergy shot today for the first time, itching started this afternoon. No other new products.  ? ?Denies difficulty breathing.  ?

## 2021-11-13 NOTE — ED Provider Triage Note (Signed)
Emergency Medicine Provider Triage Evaluation Note ? ?Morgan Stevens , a 56 y.o. female  was evaluated in triage.  Pt complains of allergic reaction to unknown substance.  Patient received allergy shots for dust mites in her right arm today.  Since then she has been having some itching in her throat.  She is able to swallow and tolerate p.o.  She denies any chest pain, shortness of breath.  No history of anaphylaxis..  Only rash along the injection site. ? ?Review of Systems  ?Positive: Pharyngitis, pruritic throat, skin rash injection site ?Negative: Chest pain, shortness of breath, difficulty swallowing ? ?Physical Exam  ?BP (!) 149/80   Pulse 86   Temp 98 ?F (36.7 ?C)   Resp 20   Ht 5\' 4"  (1.626 m)   LMP 07/11/2020   SpO2 100%   BMI 41.76 kg/m?  ?Gen:   Awake, no distress   ?Resp:  Normal effort, no wheezing rales or rhonchi ?MSK:   Moves extremities without difficulty  ?Other:  No pharyngeal erythema exudates or swelling. ? ?Medical Decision Making  ?Medically screening exam initiated at 4:59 PM.  Appropriate orders placed.  Daneille Desilva was informed that the remainder of the evaluation will be completed by another provider, this initial triage assessment does not replace that evaluation, and the importance of remaining in the ED until their evaluation is complete. ? ?Order for p.o. Benadryl and IM dexamethasone placed.  Patient is vital signs are stable, she will let Huel Coventry know if she has any worsening symptoms. ?  ?[place], PA-C ?11/13/21 1702 ? ?

## 2021-11-13 NOTE — Discharge Instructions (Addendum)
You may continue Benadryl 50 mg every 8 hours as needed for itching. ?

## 2021-11-13 NOTE — ED Provider Notes (Signed)
? ?[facility] ?Provider Note ? ? ? Event Date/Time  ? First MD Initiated Contact with Patient 11/13/21 1710   ?  (approximate) ? ? ?History  ? ?Pruritis ? ? ?HPI ? ?Morgan Stevens is a 56 y.o. female with history of thyroid disease, hypertension, hyperlipidemia, GERD who presents to the emergency department which itching and swelling to her throat that started after getting an allergy shot today.  No other new exposures.  No rash.  No lip or tongue swelling.  No difficulty swallowing, speaking or breathing.  No fever or pain. ? ? ?History provided by patient. ? ? ? ?Past Medical History:  ?Diagnosis Date  ? Chronic headaches   ? Gastritis   ? GERD (gastroesophageal reflux disease)   ? Hiatal hernia   ? Hyperlipidemia   ? Hypertension   ? Obesity (BMI 30-39.9)   ? Reflux   ? Thyroid disease   ? ? ?Past Surgical History:  ?Procedure Laterality Date  ? CARDIAC CATHETERIZATION  02/27/2016  ? Procedure: Left Heart Cath and Coronary Angiography;  Surgeon: Corky CraftsJayadeep S Varanasi, MD;  [facility];  Service: Cardiovascular;;  ? CESAREAN SECTION    ? CHOLECYSTECTOMY    ? COLONOSCOPY WITH PROPOFOL N/A 04/15/2019  ? Procedure: COLONOSCOPY WITH PROPOFOL;  Surgeon: Pasty Spillersahiliani, Varnita B, MD;  Location: [facility] ENDOSCOPY;  Service: Endoscopy;  Laterality: N/A;  ? ESOPHAGOGASTRODUODENOSCOPY (EGD) WITH PROPOFOL N/A 04/15/2019  ? Procedure: ESOPHAGOGASTRODUODENOSCOPY (EGD) WITH PROPOFOL;  Surgeon: Pasty Spillersahiliani, Varnita B, MD;  Location: [facility] ENDOSCOPY;  Service: Endoscopy;  Laterality: N/A;  ? THYROIDECTOMY    ? THYROIDECTOMY    ? ? ?MEDICATIONS:  ?Prior to Admission medications   ?Medication Sig Start Date End Date Taking? Authorizing Provider  ?albuterol (VENTOLIN HFA) 108 (90 Base) MCG/ACT inhaler Inhale 2 puffs into the lungs every 4 (four) hours as needed for wheezing or shortness of breath. 09/11/21   Karamalegos, Netta NeatAlexander J, DO  ?amLODipine (NORVASC) 2.5 MG tablet Take 1 tablet (2.5 mg total) by mouth  daily. 08/24/21   Smitty CordsKaramalegos, Alexander J, DO  ?aspirin 81 MG EC tablet Take 1 tablet by mouth daily. 11/21/20   [provider]  ?atorvastatin (LIPITOR) 20 MG tablet Take 1 tablet (20 mg total) by mouth at bedtime. 02/17/21 11/09/21  Corky DownsMasoud, Javed, MD  ?baclofen 5 MG TABS Take 5-10 mg by mouth 3 (three) times daily as needed for muscle spasms. 07/24/21   Smitty CordsKaramalegos, Alexander J, DO  ?Calcium Carbonate-Simethicone (TUMS GAS RELIEF CHEWY BITES PO) Take by mouth.    [provider]  ?Cyanocobalamin (VITAMIN B12 PO) Take by mouth.    [provider]  ?EUTHYROX 125 MCG tablet Take 1 tablet (125 mcg total) by mouth daily before breakfast. 07/03/21   Althea CharonKaramalegos, Netta NeatAlexander J, DO  ?famotidine (PEPCID) 40 MG tablet Take 1 tablet (40 mg total) by mouth 2 (two) times daily. 07/03/21   Karamalegos, Netta NeatAlexander J, DO  ?fluticasone (FLONASE) 50 MCG/ACT nasal spray Place 2 sprays into both nostrils daily. Use for 4-6 weeks then stop and use seasonally or as needed. 05/16/21   Smitty CordsKaramalegos, Alexander J, DO  ?gabapentin (NEURONTIN) 100 MG capsule Start 1 capsule daily at bedtime, increase by 1 cap every 2-3 days as tolerated up to 3 doses = 300mg  at bedtime 09/11/21   Smitty CordsKaramalegos, Alexander J, DO  ?meloxicam (MOBIC) 15 MG tablet Take 1 tablet (15 mg total) by mouth daily. 08/31/21 08/31/22  Orvil FeilWoods, Jaclyn M, PA-C  ?montelukast (SINGULAIR) 10 MG tablet Take  1 tablet (10 mg total) by mouth at bedtime. 09/11/21   [name], DO  ?Multiple Vitamin (MULTIVITAMIN) capsule Take 1 capsule by mouth daily. ?Patient not taking: Reported on 11/09/2021    [provider]  ? ? ?Physical Exam  ? ?Triage Vital Signs: ?ED Triage Vitals  ?Enc Vitals Group  ?   BP 11/13/21 1656 (!) 149/80  ?   Pulse Rate 11/13/21 1656 86  ?   Resp 11/13/21 1656 20  ?   Temp 11/13/21 1656 98 ?F (36.7 ?C)  ?   Temp src --   ?   SpO2 11/13/21 1656 100 %  ?   Weight 11/13/21 1711 243 lb 2.7 oz (110.3 kg)  ?   Height 11/13/21 1656 5\' 4"   (1.626 m)  ?   Head Circumference --   ?   Peak Flow --   ?   Pain Score 11/13/21 1656 0  ?   Pain Loc --   ?   Pain Edu? --   ?   Excl. in GC? --   ? ? ?Most recent vital signs: ?Vitals:  ? 11/13/21 1656  ?BP: (!) 149/80  ?Pulse: 86  ?Resp: 20  ?Temp: 98 ?F (36.7 ?C)  ?SpO2: 100%  ? ? ?CONSTITUTIONAL: Alert and oriented and responds appropriately to questions. Well-appearing; well-nourished ?HEAD: Normocephalic, atraumatic ?EYES: Conjunctivae clear, pupils appear equal, sclera nonicteric ?ENT: normal nose; moist mucous membranes; No pharyngeal erythema or petechiae, no tonsillar hypertrophy or exudate, no uvular deviation, no unilateral swelling in posterior oropharynx, no trismus or drooling, no muffled voice, normal phonation, no stridor, airway patent.  No angioedema. ?NECK: Supple, normal ROM ?CARD: RRR; S1 and S2 appreciated; no murmurs, no clicks, no rubs, no gallops ?RESP: Normal chest excursion without splinting or tachypnea; breath sounds clear and equal bilaterally; no wheezes, no rhonchi, no rales, no hypoxia or respiratory distress, speaking full sentences ?ABD/GI: Normal bowel sounds; non-distended; soft, non-tender, no rebound, no guarding, no peritoneal signs ?BACK: The back appears normal ?EXT: Normal ROM in all joints; no deformity noted, no edema; no cyanosis ?SKIN: Normal color for age and race; warm; no rash on exposed skin, no urticaria ?NEURO: Moves all extremities equally, normal speech ?PSYCH: The patient's mood and manner are appropriate. ? ? ?ED Results / Procedures / Treatments  ? ?LABS: ?(all labs ordered are listed, but only abnormal results are displayed) ?Labs Reviewed - No data to display ? ? ?EKG: ? ?RADIOLOGY: ?My personal review and interpretation of imaging:   ? ?I have personally reviewed all radiology reports.   ?No results found. ? ? ?PROCEDURES: ? ?Critical Care performed: No ? ? ?CRITICAL CARE ?Performed by: 11/15/21 Lyrah Bradt ? ? ?Total critical care time: 0 minutes ? ?Critical  care time was exclusive of separately billable procedures and treating other patients. ? ?Critical care was necessary to treat or prevent imminent or life-threatening deterioration. ? ?Critical care was time spent personally by me on the following activities: development of treatment plan with patient and/or surrogate as well as nursing, discussions with consultants, evaluation of patient's response to treatment, examination of patient, obtaining history from patient or surrogate, ordering and performing treatments and interventions, ordering and review of laboratory studies, ordering and review of radiographic studies, pulse oximetry and re-evaluation of patient's condition. ? ? ?Procedures ? ? ? ?IMPRESSION / MDM / ASSESSMENT AND PLAN / ED COURSE  ?I reviewed the triage vital signs and the nursing notes. ? ? ? ?Patient here with throat  itching, feeling like her throat was swelling after receiving an allergy shot.  Has already received Benadryl and Decadron from triage and reports feeling much better. ? ? ? ?DIFFERENTIAL DIAGNOSIS (includes but not limited to):   Allergic reaction.  Doubt ACE inhibitor induced angioedema.  No sign of pharyngitis, tonsillitis, deep space neck infection, uvulitis, PTA. ? ? ?PLAN: We will continue to monitor after Benadryl and Decadron but anticipate discharge home if she continues to improve. ? ? ?MEDICATIONS GIVEN IN ED: ?Medications  ?dexamethasone (DECADRON) injection 10 mg (10 mg Intramuscular Given 11/13/21 1701)  ?diphenhydrAMINE (BENADRYL) capsule 25 mg (25 mg Oral Given 11/13/21 1701)  ? ? ? ?ED COURSE:  6:00 PM  Pt reports she continues to feel better.  She is hemodynamically stable without angioedema, stridor, trismus, drooling, hypoxia, wheezing, hypotension.  She has an allergy specialist for follow-up.  Recommended continued Benadryl as needed.  Discussed return precautions. ? ?At this time, I do not feel there is any life-threatening condition present. I reviewed all  nursing notes, vitals, pertinent previous records.  All lab and urine results, EKGs, imaging ordered have been independently reviewed and interpreted by myself.  I reviewed all available radiology reports

## 2021-11-14 ENCOUNTER — Telehealth: Payer: Self-pay

## 2021-11-14 NOTE — Telephone Encounter (Signed)
Transition Care Management Unsuccessful Follow-up Telephone Call ? ?Date of discharge and from where:  11/13/2021-[facility] ? ?Attempts:  1st Attempt ? ?Reason for unsuccessful TCM follow-up call:  Left voice message ? ?  ?

## 2021-11-15 NOTE — Telephone Encounter (Signed)
Transition Care Management Unsuccessful Follow-up Telephone Call ? ?Date of discharge and from where:  11/13/2021-[facility] ? ?Attempts:  2nd Attempt ? ?Reason for unsuccessful TCM follow-up call:  Left voice message ? ?  ?

## 2021-11-16 ENCOUNTER — Other Ambulatory Visit (HOSPITAL_COMMUNITY): Payer: Self-pay | Admitting: Neurosurgery

## 2021-11-16 ENCOUNTER — Other Ambulatory Visit: Payer: Self-pay | Admitting: Neurosurgery

## 2021-11-16 DIAGNOSIS — M4802 Spinal stenosis, cervical region: Secondary | ICD-10-CM

## 2021-11-16 DIAGNOSIS — M5412 Radiculopathy, cervical region: Secondary | ICD-10-CM

## 2021-11-16 NOTE — Telephone Encounter (Signed)
Transition Care Management Unsuccessful Follow-up Telephone Call ? ?Date of discharge and from where:  11/13/2021-[facility] ? ?Attempts:  3rd Attempt ? ?Reason for unsuccessful TCM follow-up call:  Left voice message ? ?  ?

## 2021-11-22 ENCOUNTER — Encounter: Payer: Self-pay | Admitting: Family Medicine

## 2021-11-22 ENCOUNTER — Ambulatory Visit: Payer: Medicaid Other | Admitting: Family Medicine

## 2021-11-22 VITALS — BP 126/88 | HR 89 | Temp 97.5°F | Wt 241.0 lb

## 2021-11-22 DIAGNOSIS — M722 Plantar fascial fibromatosis: Secondary | ICD-10-CM | POA: Diagnosis not present

## 2021-11-22 MED ORDER — NAPROXEN 500 MG PO TABS: 500.0000 mg | ORAL_TABLET | Freq: Two times a day (BID) | ORAL | 1 refills | Status: DC

## 2021-11-22 NOTE — Patient Instructions (Addendum)
Thank you for coming to the office today. ? ?If interested we can change the medications a bit if still having chest / pressure symptoms: ? ?Option 1 - start Metoprolol add on to current meds. ? ?Option 2 - Discontinue Amlodipine, Atorvastatin, and start 2 new meds Diltiazem and Rosuvastatin ?  ?Probably easiest in my opinion to try the Metoprolol and see if we are on the right track, If it does not work then perhaps swap both meds or refer back to Cardiologist ?------------------------------------------ ? 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 ?- This is inflammation of the fibrous connection on the bottom of the foot, and can have small micro tears over time that become painful. It usually will have flare ups lasting days to weeks, and may come back after it heals if it is re-aggravated again. ?- Often there is a bone spur or arthritis of the heel bone that causes this ?- Also it may be caused by abnormal footwear, walking pattern or other problems ? ?If you are experiencing an acute flare with pain, this is usually worst first thing in the morning when the plantar fascia is tight and stiff. First step out of bed is painful usually, and it may gradually improve with stretching and walking. ? ?Recommend trial of Anti-inflammatory with Naproxen (Naprosyn) 500mg  tabs - take one with food and plenty of water TWICE daily every day (breakfast and dinner), for next 1 to 2 weeks, then you may take only as needed ?- DO NOT TAKE any ibuprofen, aleve, motrin while you are taking this medicine ?- It is safe to take Tylenol Ext Str 500mg  tabs - take 1 to 2 (max dose 1000mg ) every 6 hours as needed for breakthrough pain, max 24 hour daily dose is 6 to 8 tablets or 4000mg  ? ?Recommend: ?- Rest / relative rest with activity modification avoid overuse / prolonged stand ?- Ice packs (make sure you use a towel or sock / something to protect skin) ? ?May also try topical muscle rub, icy hot, VOLTAREN, tiger  balm ? ?Start the exercises listed below, gradually increase them as instructed. May be sore at first and hopefully will stretch out and help reduce pain later. ? ?If not improving after 1-2 weeks on medicine, and stretching exercises and some rest - then can contact our office again for re-evaluation may consider other causes or can refer you to Orthopedic specialist to have second opinion, and consider a steroid injection. ? ? ?Please schedule a Follow-up Appointment to: Return if symptoms worsen or fail to improve, for 4-6 weeks plantar fasciitis, if not improved. ? ?If you have any other questions or concerns, please feel free to call the office or send a message through MyChart. You may also schedule an earlier appointment if necessary. ? ?Additionally, you may be receiving a survey about your experience at our office within a few days to 1 week by e-mail or mail. We value your feedback. ? ? , DO ?[facility], ? ? ? ? ? ? ? ? ? ? ? ? ? ?Plantar Fascia Stretches / Exercises ? ?See other page with pictures of each exercise. ? ?Start with 1 or 2 of these exercises that you are most comfortable with. Do not do any exercises that cause you significant worsening pain. Some of these may cause some "stretching soreness" but it should go away after you stop the exercise, and get better over time. Gradually increase up to 3-4 exercises  as tolerated. ? ?You may begin exercising the muscles of your foot right away by gently stretching them as follows: ? ?Stretching: ?Towel stretch: Sit on a hard surface with your injured leg stretched out in front of you. Loop a towel around the ball of your foot and pull the towel toward your body keeping your knee straight. Hold this position for 15 to 30 seconds then relax. Repeat 3 times. ?When the towel stretch becomes to easy, you may begin doing the standing calf stretch. ? ?Standing calf stretch: Facing a wall, put your hands against the  wall at about eye level. Keep the injured leg back, the uninjured leg forward, and the heel of your injured leg on the floor. Turn your injured foot slightly inward (as if you were pigeon-toed) as you slowly lean into the wall until you feel a stretch in the back of your calf. Hold for 15 to 30 seconds. Repeat 3 times. Do this exercise several times each day. ?When you can stand comfortably on your injured foot, you can begin stretching the bottom of your foot using the plantar fascia stretch. ? ?Plantar fascia stretch: Stand with the ball of your injured foot on a stair. Reach for the bottom step with your heel until you feel a stretch in the arch of your foot. Hold this position for 15 to 30 seconds and then relax. Repeat 3 times. ?After you have stretched the bottom muscles of your foot, you can begin strengthening the top muscles of your foot. ? ?Frozen can roll: Roll your bare injured foot back and forth from your heel to your mid-arch over a frozen juice can. Repeat for 3 to 5 minutes. This exercise is particularly helpful if done first thing in the morning. ?Towel pickup: With your heel on the ground, pick up a towel with your toes. Release. Repeat 10 to 20 times. When this gets easy, add more resistance by placing a book or small weight on the towel. ?Static and dynamic balance exercises ?Place a chair next to your non-injured leg and stand upright. (This will provide you with balance if needed.) Stand on your injured foot. Try to raise the arch of your foot while keeping your toes on the floor. Try to maintain this position and balance on your injured side for 30 seconds. This exercise can be made more difficult by doing it on a piece of foam or a pillow, or with your eyes closed. ?Stand in the same position as above. Keep your foot in this position and reach forward in front of you with your injured side's hand, allowing your knee to bend. Repeat this 10 times while maintaining the arch height. This  exercise can be made more difficult by reaching farther in front of you. Do 2 sets. ?Stand in the same position as above. While maintaining your arch height, reach the injured side's hand across your body toward the chair. The farther you reach, the more challenging the exercise. Do 2 sets of 10. ? ?Next, you can begin strengthening the muscles of your foot and lower leg by using elastic tubing. ? ?Strengthening: ?Resisted dorsiflexion: Sit with your injured leg out straight and your foot facing a doorway. Tie a loop in one end of the tubing. Put your foot through the loop so that the tubing goes around the arch of your foot. Tie a knot in the other end of the tubing and shut the knot in the door. Move backward until there is tension in the  tubing. Keeping your knee straight, pull your foot toward your body, stretching the tubing. Slowly return to the starting position. Do 3 sets of 10. ?Resisted plantar flexion: Sit with your leg outstretched and loop the middle section of the tubing around the ball of your foot. Hold the ends of the tubing in both hands. Gently press the ball of your foot down and point your toes, stretching the tubing. Return to the starting position. Do 3 sets of 10. ?Resisted inversion: Sit with your legs out straight and cross your uninjured leg over your injured ankle. Wrap the tubing around the ball of your injured foot and then loop it around your uninjured foot so that the tubing is anchored there at one end. Hold the other end of the tubing in your hand. Turn your injured foot inward and upward. This will stretch the tubing. Return to the starting position. Do 3 sets of 10. ?Resisted eversion: Sit with both legs stretched out in front of you, with your feet about a shoulder's width apart. Tie a loop in one end of the tubing. Put your injured foot through the loop so that the tubing goes around the arch of that foot and wraps around the outside of the uninjured foot. Hold onto the other  end of the tubing with your hand to provide tension. Turn your injured foot up and out. Make sure you keep your uninjured foot still so that it will allow the tubing to stretch as you move your injured foot. Return to th

## 2021-11-22 NOTE — Progress Notes (Signed)
? ?Subjective:  ? ? Patient ID: Morgan Stevens, female    DOB: 22-Nov-1965, 56 y.o.   MRN: [id number] ? ?Morgan Stevens is a 56 y.o. female presenting on 11/22/2021 for Foot Pain ? ? ?HPI ? ?Left Heel Pain ?Reports persistent Left heel foot pain for past 1 month, seems to be episodic worse with weightbearing walking, pain can be mild to moderate with minimal activity then can get up to severe pain at times.  ?Not taking any medicine ?- No prior injury or pain like this before ?- Denies any swelling, redness, injury or trauma ? ? ? ? ?  11/22/2021  ?  2:49 PM 11/09/2021  ?  1:58 PM 10/03/2021  ?  2:55 PM  ?Depression screen PHQ 2/9  ?Decreased Interest 1 0 0  ?Down, Depressed, Hopeless 1 0 0  ?PHQ - 2 Score 2 0 0  ?Altered sleeping 0  1  ?Tired, decreased energy 1  2  ?Change in appetite 0  0  ?Feeling bad or failure about yourself  1  0  ?Trouble concentrating 1  0  ?Moving slowly or fidgety/restless 0  0  ?Suicidal thoughts 0  0  ?PHQ-9 Score 5  3  ?Difficult doing work/chores Somewhat difficult  Not difficult at all  ? ? ?Social History  ? ?Tobacco Use  ? Smoking status: Never  ? Smokeless tobacco: Never  ?Vaping Use  ? Vaping Use: Never used  ?Substance Use Topics  ? Alcohol use: No  ?  Alcohol/week: 0.0 standard drinks  ? Drug use: No  ? ? ?Review of Systems ?Per HPI unless specifically indicated above ? ?   ?Objective:  ?  ?BP 126/88 (BP Location: Right Arm, Patient Position: Sitting, Cuff Size: Large)   Pulse 89   Temp (!) 97.5 ?F (36.4 ?C) (Temporal)   Wt 241 lb (109.3 kg)   LMP 07/11/2020   SpO2 98%   BMI 41.37 kg/m?   ?Wt Readings from Last 3 Encounters:  ?11/22/21 241 lb (109.3 kg)  ?11/13/21 243 lb 2.7 oz (110.3 kg)  ?11/09/21 243 lb 4.8 oz (110.4 kg)  ?  ?Physical Exam ?Vitals and nursing note reviewed.  ?Constitutional:   ?   General: She is not in acute distress. ?   Appearance: Normal appearance. She is well-developed. She is obese. She is not diaphoretic.  ?   Comments: Well-appearing, comfortable,  cooperative  ?HENT:  ?   Head: Normocephalic and atraumatic.  ?Eyes:  ?   General:     ?   Right eye: No discharge.     ?   Left eye: No discharge.  ?   Conjunctiva/sclera: Conjunctivae normal.  ?Cardiovascular:  ?   Rate and Rhythm: Normal rate.  ?Pulmonary:  ?   Effort: Pulmonary effort is normal.  ?Musculoskeletal:  ?   Comments: Localized Left foot plantar aspect heel and mid foot in medial section of plantar fasciia without point tenderness. No arch deformity.  ?Skin: ?   General: Skin is warm and dry.  ?   Findings: No erythema or rash.  ?Neurological:  ?   Mental Status: She is alert and oriented to person, place, and time.  ?Psychiatric:     ?   Mood and Affect: Mood normal.     ?   Behavior: Behavior normal.     ?   Thought Content: Thought content normal.  ?   Comments: Well groomed, good eye contact, normal speech and thoughts  ? ? ? ?Results  for orders placed or performed during the hospital encounter of 10/02/21  ?CBC  ?Result Value Ref Range  ? WBC 11.9 (H) 4.0 - 10.5 K/uL  ? RBC 4.68 3.87 - 5.11 MIL/uL  ? Hemoglobin 14.5 12.0 - 15.0 g/dL  ? HCT 41.6 36.0 - 46.0 %  ? MCV 88.9 80.0 - 100.0 fL  ? MCH 31.0 26.0 - 34.0 pg  ? MCHC 34.9 30.0 - 36.0 g/dL  ? RDW 12.6 11.5 - 15.5 %  ? Platelets 413 (H) 150 - 400 K/uL  ? nRBC 0.0 0.0 - 0.2 %  ?Basic metabolic panel  ?Result Value Ref Range  ? Sodium 138 135 - 145 mmol/L  ? Potassium 3.7 3.5 - 5.1 mmol/L  ? Chloride 105 98 - 111 mmol/L  ? CO2 25 22 - 32 mmol/L  ? Glucose, Bld 114 (H) 70 - 99 mg/dL  ? BUN 20 6 - 20 mg/dL  ? Creatinine, Ser 0.86 0.44 - 1.00 mg/dL  ? Calcium 9.6 8.9 - 10.3 mg/dL  ? GFR, Estimated >60 >60 mL/min  ? Anion gap 8 5 - 15  ?TSH  ?Result Value Ref Range  ? TSH 1.018 0.350 - 4.500 uIU/mL  ?T4, free  ?Result Value Ref Range  ? Free T4 1.24 (H) 0.61 - 1.12 ng/dL  ?Troponin I (High Sensitivity)  ?Result Value Ref Range  ? Troponin I (High Sensitivity) 4 <18 ng/L  ?Troponin I (High Sensitivity)  ?Result Value Ref Range  ? Troponin I (High  Sensitivity) 5 <18 ng/L  ? ?   ?Assessment & Plan:  ? ?Problem List Items Addressed This Visit   ?None ?Visit Diagnoses   ? ? Plantar fasciitis of left foot    -  Primary  ? Relevant Medications  ? naproxen (NAPROSYN) 500 MG tablet  ? ?  ?  ?Clinically consistent with acute L plantar fasciitis based on history and exam, localized pain. Likely underlying etiology with prolonged standing repetitive work  ?- No known injury. Unlikely fracture based on history, no bony tenderness. ?- Limited conservative therapy  ? ?Plan: ?1. Reviewed diagnosis and management of plantar fasciitis ?2. Start rx NSAID trial - Naproxen 500mg  BID wc x 2-4 weeks then PRN ?3. May take Tylenol PRN breakthrough ?4. May use topical muscle rub, ice ?5. Emphasized importance of relative rest, ice, and avoid prolonged standing and overuse ?6. Handout given and explained appropriate stretching and home exercises important to do first thing in morning and later ?Recommend Tension Night Splint ?7. Follow-up 4-6 weeks if not improved, consider referral to Podiatry for further eval x-ray and injection if indicated. ? ? ?Meds ordered this encounter  ?Medications  ? naproxen (NAPROSYN) 500 MG tablet  ?  Sig: Take 1 tablet (500 mg total) by mouth 2 (two) times daily with a meal. For 1-2 weeks then as needed  ?  Dispense:  60 tablet  ?  Refill:  1  ? ? ? ? ?Follow up plan: ?Return if symptoms worsen or fail to improve, for 4-6 weeks plantar fasciitis, if not improved. ? ? ? , DO ?[facility] ?[facility] ?11/22/2021, 2:45 PM ?

## 2021-11-28 ENCOUNTER — Ambulatory Visit
Admission: RE | Admit: 2021-11-28 | Discharge: 2021-11-28 | Disposition: A | Discharge status: Home / Self Care | Payer: Medicaid Other | Source: Ambulatory Visit | Attending: Neurosurgery | Admitting: Neurosurgery

## 2021-11-28 DIAGNOSIS — M4802 Spinal stenosis, cervical region: Secondary | ICD-10-CM

## 2021-11-28 DIAGNOSIS — M5412 Radiculopathy, cervical region: Secondary | ICD-10-CM

## 2021-11-28 DIAGNOSIS — M542 Cervicalgia: Secondary | ICD-10-CM | POA: Diagnosis not present

## 2021-11-28 DIAGNOSIS — R200 Anesthesia of skin: Secondary | ICD-10-CM | POA: Diagnosis not present

## 2021-11-28 IMAGING — MR MR CERVICAL SPINE W/O CM
5 series · 37 of 48 positions shown · non-contrast
Comparison: Cervical spine MRI [DATE].

CLINICAL DATA: 55-year-old female with neck pain radiating to the
right side with numbness, progressive over time.

EXAM:
MRI CERVICAL SPINE WITHOUT CONTRAST
TECHNIQUE: Multiplanar, multisequence MR imaging of the cervical spine was
performed. No intravenous contrast was administered.

[Series 5: T2 · sagittal · 3.0mm · 0.62mm/px · 6 of 15 slices shown (1 of 2)]
[im 1/15]
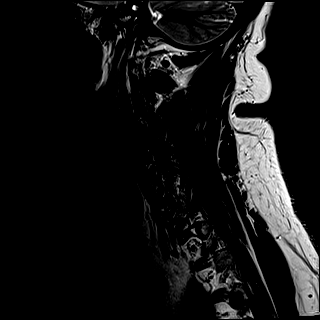
[im 3/15]
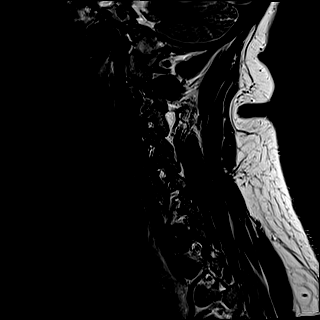
[im 6/15]
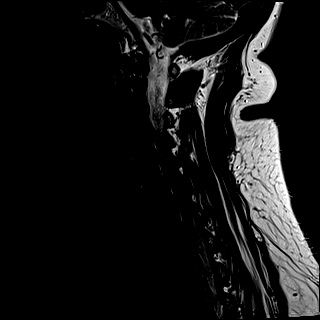
[im 9/15]
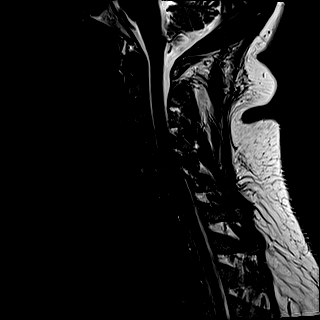
[im 12/15]
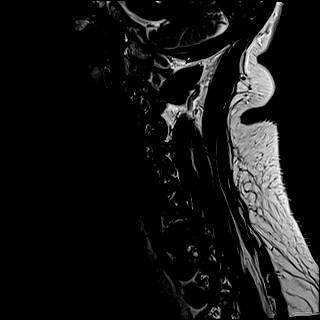
[im 15/15]
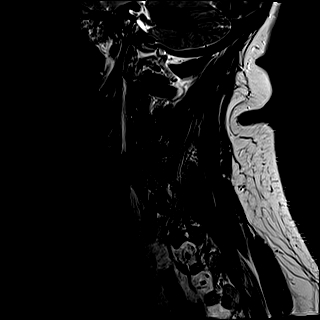

[Series 6: FLAIR · sagittal · 3.0mm · 0.78mm/px · 7 of 15 slices shown]
[im 1/15]
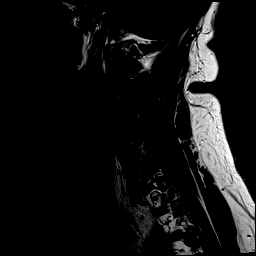
[im 3/15]
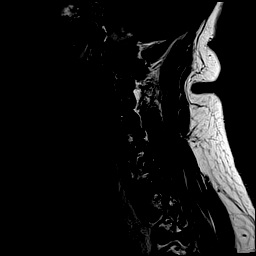
[im 5/15]
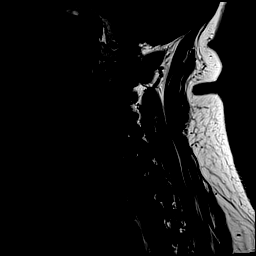
[im 8/15]
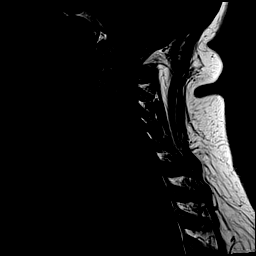
[im 10/15]
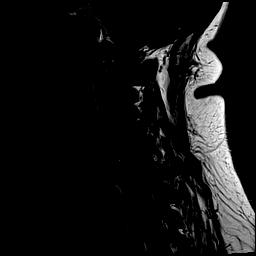
[im 12/15]
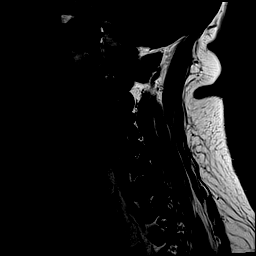
[im 15/15]
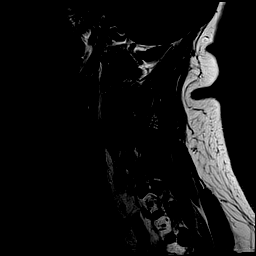

[Series 7: STIR · sagittal · 3.0mm · 0.62mm/px · 7 of 15 slices shown]
[im 1/15]
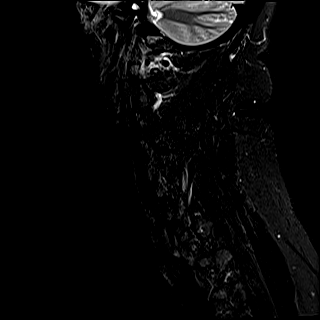
[im 3/15]
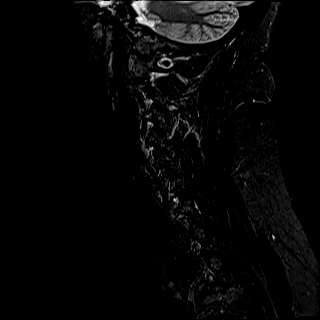
[im 5/15]
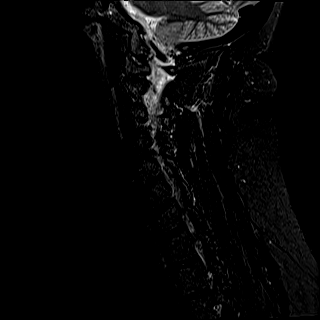
[im 8/15]
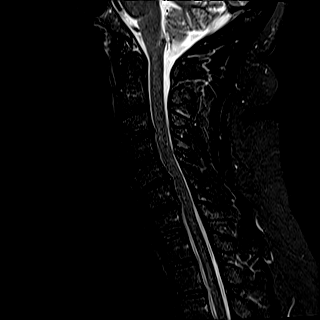
[im 10/15]
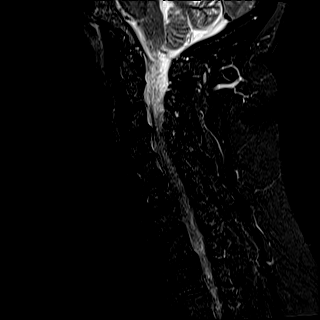
[im 12/15]
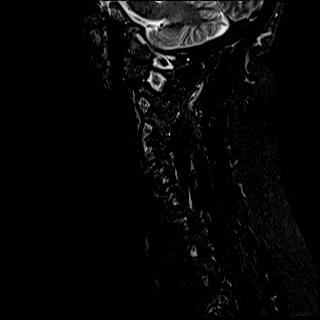
[im 15/15]
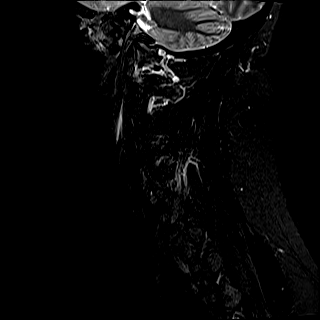

[Series 8: T2 · axial · 3.0mm · 0.70mm/px · z∈[-144,-50]mm · 9 of 29 slices shown (2 of 2)]
[im 1/29]
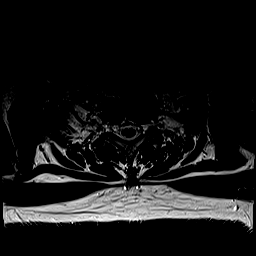
[im 3/29]
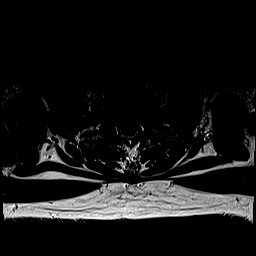
[im 5/29]
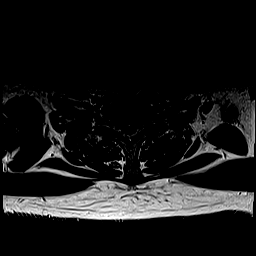
[im 9/29]
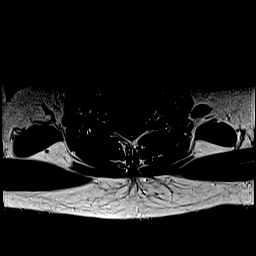
[im 13/29]
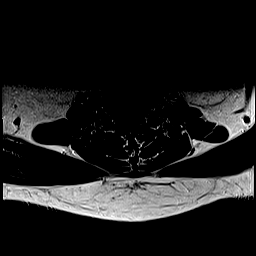
[im 16/29]
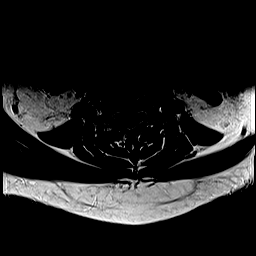
[im 20/29]
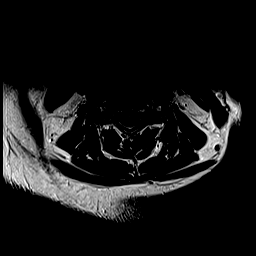
[im 24/29]
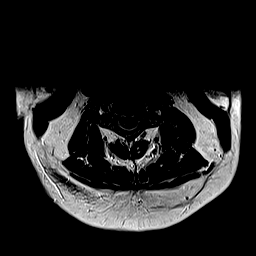
[im 29/29]
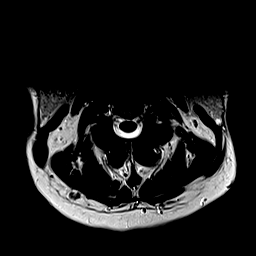

[Series 9: ax mpgr · axial · 3.0mm · 0.35mm/px · z∈[-144,-50]mm · 8 of 29 slices shown]
[im 1/29]
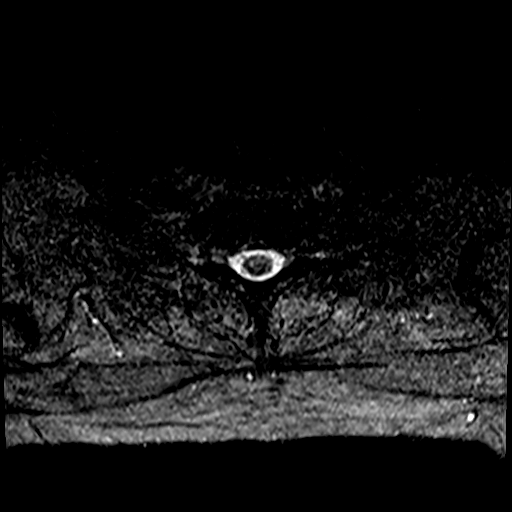
[im 5/29]
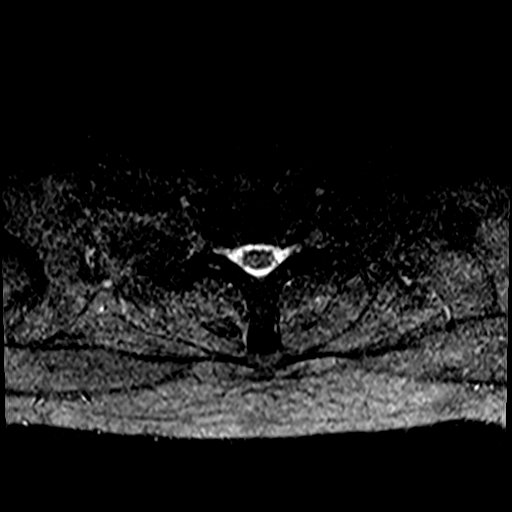
[im 9/29]
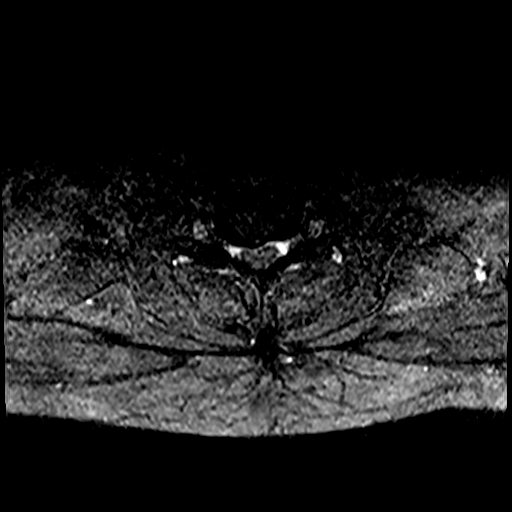
[im 13/29]
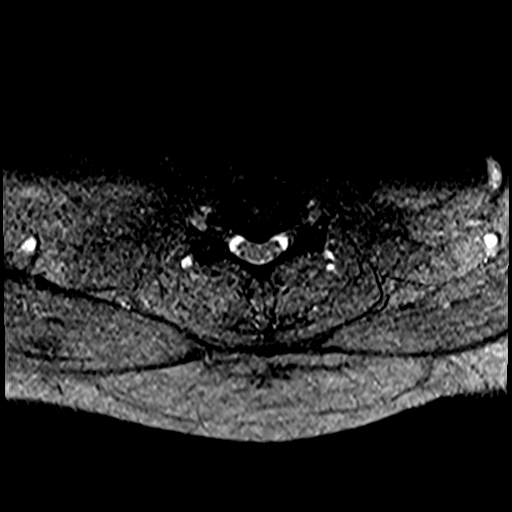
[im 16/29]
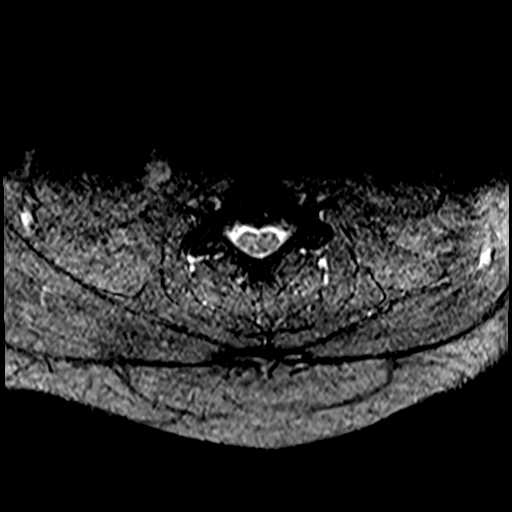
[im 20/29]
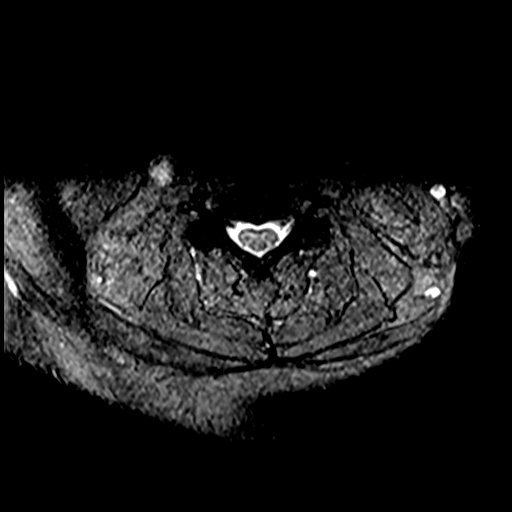
[im 24/29]
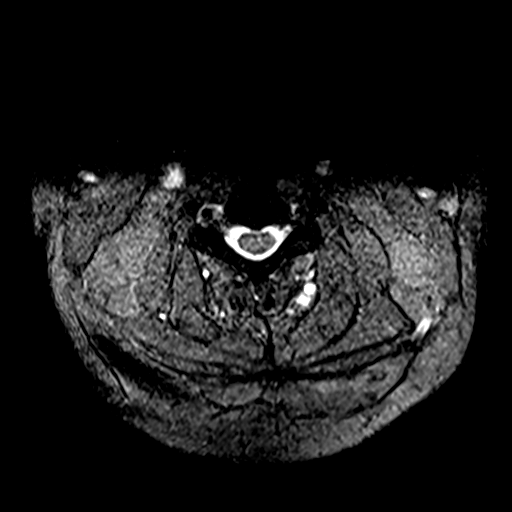
[im 29/29]
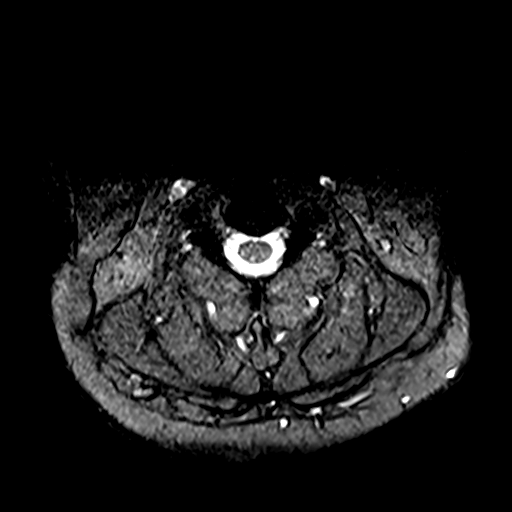

[37 of 48 positions shown; findings below may reference images not displayed]

FINDINGS: Alignment: Stable. Chronic straightening of cervical lordosis with
subtle anterolisthesis of C5-C6.

Vertebrae: No marrow edema or evidence of acute osseous abnormality.
Visualized bone marrow signal is within normal limits.

Cord: No cervical spinal cord signal abnormality despite multilevel
degenerative spinal cord mass effect as detailed below. Negative
visible upper thoracic canal and spinal cord.

Posterior Fossa, vertebral arteries, paraspinal tissues:
Cervicomedullary junction is within normal limits. Negative visible
posterior fossa. Preserved major vascular flow voids in the neck.
Negative visible neck soft tissues.

Disc levels:

C2-C3:  Negative.

C3-C4: Chronic central disc protrusion (series 9, image 8) with mild
spinal stenosis and mild ventral cord mass effect. This has mildly
progressed. Mild additional disc bulging and foraminal endplate
spurring but mild if any associated C4 foraminal stenosis.

C4-C5: Mild disc bulging and facet hypertrophy greater on the right.
Borderline to mild spinal stenosis. Mild to moderate right C5
foraminal stenosis. This level is stable.

C5-C6: Chronic anterolisthesis and disc space loss with bulky
central disc protrusion (series 9, image 16). Mild additional disc
bulging and endplate spurring. Mild spinal stenosis and ventral cord
mass effect. Mild if any associated C6 foraminal stenosis. This
level is stable.

C6-C7: Circumferential disc bulge with superimposed right
paracentral disc protrusion (series 9, image 20 and series 8, image
21) with small annular fissure. Mild spinal stenosis and right hemi
cord mass effect. Mild endplate spurring and right C7 foraminal
stenosis have progressed.

C7-T1: Mild facet and ligament flavum hypertrophy. Mild foraminal
endplate spurring. But no convincing stenosis.
IMPRESSION: 1. Cervical disc degeneration with chronic disc herniations C3-C4,
C5-C6, and C6-C7 are largely stable from a [9N] MRI. Mild chronic
spondylolisthesis at C5-C6.
2. Subsequent mild spinal stenosis and spinal cord mass effect at
each level. But no cord signal abnormality.
3. Mild progression of right neural foraminal stenosis at C6-C7.
Query right C7 radiculitis.

## 2021-12-20 DIAGNOSIS — R200 Anesthesia of skin: Secondary | ICD-10-CM | POA: Diagnosis not present

## 2021-12-21 ENCOUNTER — Ambulatory Visit: Payer: Medicaid Other | Admitting: Dietician

## 2022-01-09 ENCOUNTER — Encounter: Payer: Self-pay | Admitting: Family Medicine

## 2022-01-09 ENCOUNTER — Telehealth: Payer: Medicaid Other | Admitting: Family Medicine

## 2022-01-09 DIAGNOSIS — Z91199 Patient's noncompliance with other medical treatment and regimen due to unspecified reason: Secondary | ICD-10-CM

## 2022-01-09 NOTE — Progress Notes (Signed)
Attempted to call patient at 1140am and repeat calls and attempted to sign in to video visit on mychart for 10 minutes unsuccessful. ?

## 2022-01-17 DIAGNOSIS — M67922 Unspecified disorder of synovium and tendon, left upper arm: Secondary | ICD-10-CM | POA: Diagnosis not present

## 2022-01-24 ENCOUNTER — Other Ambulatory Visit: Payer: Self-pay | Admitting: Orthopedic Surgery

## 2022-01-29 ENCOUNTER — Ambulatory Visit: Payer: Medicaid Other | Admitting: Family Medicine

## 2022-01-29 ENCOUNTER — Encounter: Payer: Self-pay | Admitting: Dietician

## 2022-01-29 NOTE — Progress Notes (Signed)
Have not heard back from patient to reschedule her cancelled appointment from 12/21/21. Sent notification to referring provider. 

## 2022-01-30 ENCOUNTER — Encounter: Payer: Self-pay | Admitting: Orthopedic Surgery

## 2022-01-30 DIAGNOSIS — R202 Paresthesia of skin: Secondary | ICD-10-CM | POA: Diagnosis not present

## 2022-01-30 DIAGNOSIS — G4733 Obstructive sleep apnea (adult) (pediatric): Secondary | ICD-10-CM | POA: Diagnosis not present

## 2022-01-30 DIAGNOSIS — M25512 Pain in left shoulder: Secondary | ICD-10-CM | POA: Diagnosis not present

## 2022-01-30 DIAGNOSIS — G8929 Other chronic pain: Secondary | ICD-10-CM | POA: Diagnosis not present

## 2022-01-30 DIAGNOSIS — M545 Low back pain, unspecified: Secondary | ICD-10-CM | POA: Diagnosis not present

## 2022-01-31 ENCOUNTER — Other Ambulatory Visit: Payer: Self-pay | Admitting: Student

## 2022-01-31 DIAGNOSIS — M545 Low back pain, unspecified: Secondary | ICD-10-CM

## 2022-02-06 ENCOUNTER — Other Ambulatory Visit: Payer: Self-pay

## 2022-02-06 ENCOUNTER — Ambulatory Visit: Payer: Medicaid Other | Admitting: Anesthesiology

## 2022-02-06 ENCOUNTER — Encounter
Admission: RE | Disposition: A | Discharge status: Home / Self Care | Payer: Self-pay | Source: Ambulatory Visit | Attending: Orthopedic Surgery

## 2022-02-06 ENCOUNTER — Ambulatory Visit
Admission: RE | Admit: 2022-02-06 | Discharge: 2022-02-06 | Disposition: A | Discharge status: Home / Self Care | Payer: Medicaid Other | Source: Ambulatory Visit | Attending: Orthopedic Surgery | Admitting: Orthopedic Surgery

## 2022-02-06 DIAGNOSIS — I10 Essential (primary) hypertension: Secondary | ICD-10-CM | POA: Insufficient documentation

## 2022-02-06 DIAGNOSIS — M75112 Incomplete rotator cuff tear or rupture of left shoulder, not specified as traumatic: Secondary | ICD-10-CM | POA: Insufficient documentation

## 2022-02-06 DIAGNOSIS — E039 Hypothyroidism, unspecified: Secondary | ICD-10-CM | POA: Insufficient documentation

## 2022-02-06 DIAGNOSIS — K219 Gastro-esophageal reflux disease without esophagitis: Secondary | ICD-10-CM | POA: Insufficient documentation

## 2022-02-06 DIAGNOSIS — M7582 Other shoulder lesions, left shoulder: Secondary | ICD-10-CM | POA: Diagnosis not present

## 2022-02-06 DIAGNOSIS — M7522 Bicipital tendinitis, left shoulder: Secondary | ICD-10-CM | POA: Diagnosis not present

## 2022-02-06 DIAGNOSIS — M7542 Impingement syndrome of left shoulder: Secondary | ICD-10-CM | POA: Diagnosis not present

## 2022-02-06 DIAGNOSIS — Z6841 Body Mass Index (BMI) 40.0 and over, adult: Secondary | ICD-10-CM | POA: Insufficient documentation

## 2022-02-06 DIAGNOSIS — M25812 Other specified joint disorders, left shoulder: Secondary | ICD-10-CM | POA: Diagnosis present

## 2022-02-06 HISTORY — DX: Spinal stenosis, site unspecified: M48.00

## 2022-02-06 HISTORY — DX: Presence of spectacles and contact lenses: Z97.3

## 2022-02-06 HISTORY — DX: Dizziness and giddiness: R42

## 2022-02-06 HISTORY — PX: SHOULDER ARTHROSCOPY WITH DEBRIDEMENT AND BICEP TENDON REPAIR: SHX5690

## 2022-02-06 SURGERY — SHOULDER ARTHROSCOPY WITH DEBRIDEMENT AND BICEP TENDON REPAIR
Anesthesia: General | Site: Shoulder | Laterality: Left

## 2022-02-06 MED ORDER — ONDANSETRON HCL 4 MG/2ML IJ SOLN
INTRAMUSCULAR | Status: DC | PRN
  Administered 2022-02-06: 4 mg via INTRAVENOUS

## 2022-02-06 MED ORDER — BUPIVACAINE HCL (PF) 0.5 % IJ SOLN
INTRAMUSCULAR | Status: DC | PRN
  Administered 2022-02-06: 20 mL via PERINEURAL

## 2022-02-06 MED ORDER — ONDANSETRON HCL 4 MG/2ML IJ SOLN
4.0000 mg | Freq: Once | INTRAMUSCULAR | Status: AC | PRN
  Administered 2022-02-06: 4 mg via INTRAVENOUS

## 2022-02-06 MED ORDER — BUPIVACAINE LIPOSOME 1.3 % IJ SUSP
INTRAMUSCULAR | Status: DC | PRN
  Administered 2022-02-06: 20 mL via PERINEURAL

## 2022-02-06 MED ORDER — CEFAZOLIN SODIUM-DEXTROSE 2-4 GM/100ML-% IV SOLN
2.0000 g | INTRAVENOUS | Status: AC
  Administered 2022-02-06: 2 g via INTRAVENOUS

## 2022-02-06 MED ORDER — LACTATED RINGERS IV SOLN: INTRAVENOUS | Status: DC

## 2022-02-06 MED ORDER — ONDANSETRON 4 MG PO TBDP: 4.0000 mg | ORAL_TABLET | Freq: Three times a day (TID) | ORAL | 0 refills | Status: DC | PRN

## 2022-02-06 MED ORDER — GLYCOPYRROLATE 0.2 MG/ML IJ SOLN
INTRAMUSCULAR | Status: DC | PRN
  Administered 2022-02-06: .1 mg via INTRAVENOUS

## 2022-02-06 MED ORDER — PROPOFOL 10 MG/ML IV BOLUS
INTRAVENOUS | Status: DC | PRN
  Administered 2022-02-06: 150 mg via INTRAVENOUS

## 2022-02-06 MED ORDER — ACETAMINOPHEN 500 MG PO TABS: 1000.0000 mg | ORAL_TABLET | Freq: Three times a day (TID) | ORAL | 2 refills | Status: DC

## 2022-02-06 MED ORDER — EPHEDRINE SULFATE (PRESSORS) 50 MG/ML IJ SOLN
INTRAMUSCULAR | Status: DC | PRN
  Administered 2022-02-06: 10 mg via INTRAVENOUS
  Administered 2022-02-06: 5 mg via INTRAVENOUS

## 2022-02-06 MED ORDER — LACTATED RINGERS IV SOLN: INTRAVENOUS | Status: DC | PRN

## 2022-02-06 MED ORDER — OXYCODONE HCL 5 MG/5ML PO SOLN: 5.0000 mg | Freq: Once | ORAL | Status: AC | PRN

## 2022-02-06 MED ORDER — OXYCODONE HCL 5 MG PO TABS
5.0000 mg | ORAL_TABLET | Freq: Once | ORAL | Status: AC | PRN
  Administered 2022-02-06: 5 mg via ORAL

## 2022-02-06 MED ORDER — ACETAMINOPHEN 325 MG PO TABS: 325.0000 mg | ORAL_TABLET | ORAL | Status: DC | PRN

## 2022-02-06 MED ORDER — ACETAMINOPHEN 160 MG/5ML PO SOLN: 325.0000 mg | ORAL | Status: DC | PRN

## 2022-02-06 MED ORDER — OXYCODONE HCL 5 MG PO TABS: 5.0000 mg | ORAL_TABLET | ORAL | 0 refills | Status: DC | PRN

## 2022-02-06 MED ORDER — ASPIRIN 325 MG PO TBEC: 325.0000 mg | DELAYED_RELEASE_TABLET | Freq: Every day | ORAL | 0 refills | Status: AC

## 2022-02-06 MED ORDER — LIDOCAINE HCL (CARDIAC) PF 100 MG/5ML IV SOSY
PREFILLED_SYRINGE | INTRAVENOUS | Status: DC | PRN
  Administered 2022-02-06: 40 mg via INTRATRACHEAL

## 2022-02-06 MED ORDER — LACTATED RINGERS IR SOLN
Status: DC | PRN
  Administered 2022-02-06: 3000 mL

## 2022-02-06 MED ORDER — DEXAMETHASONE SODIUM PHOSPHATE 4 MG/ML IJ SOLN
INTRAMUSCULAR | Status: DC | PRN
  Administered 2022-02-06: 4 mg via INTRAVENOUS

## 2022-02-06 MED ORDER — FENTANYL CITRATE (PF) 100 MCG/2ML IJ SOLN
INTRAMUSCULAR | Status: DC | PRN
  Administered 2022-02-06: 100 ug via INTRAVENOUS

## 2022-02-06 MED ORDER — FENTANYL CITRATE PF 50 MCG/ML IJ SOSY
25.0000 ug | PREFILLED_SYRINGE | INTRAMUSCULAR | Status: DC | PRN
  Administered 2022-02-06: 25 ug via INTRAVENOUS

## 2022-02-06 MED ORDER — MIDAZOLAM HCL 2 MG/2ML IJ SOLN
INTRAMUSCULAR | Status: DC | PRN
  Administered 2022-02-06: 2 mg via INTRAVENOUS

## 2022-02-06 SURGICAL SUPPLY — 59 items
ADAPTER IRRIG TUBE 2 SPIKE SOL (ADAPTER) ×4 IMPLANT
ADH SKN CLS APL DERMABOND .7 (GAUZE/BANDAGES/DRESSINGS) ×1
ADPR TBG 2 SPK PMP STRL ASCP (ADAPTER) ×2
ANCH SUT BN ASCP DLV (Anchor) ×1 IMPLANT
ANCH SUT RGNRT REGENETEN (Staple) ×1 IMPLANT
ANCHOR BONE REGENETEN (Anchor) ×1 IMPLANT
ANCHOR TENDON REGENETEN (Staple) ×1 IMPLANT
APL PRP STRL LF DISP 70% ISPRP (MISCELLANEOUS) ×1
BLADE SHAVER 4.5X7 STR FR (MISCELLANEOUS) ×2 IMPLANT
BUR BR 5.5 WIDE MOUTH (BURR) ×2 IMPLANT
CANNULA PART THRD DISP 5.75X7 (CANNULA) IMPLANT
CANNULA PARTIAL THREAD 2X7 (CANNULA) IMPLANT
CHLORAPREP W/TINT 26 (MISCELLANEOUS) ×2 IMPLANT
COOLER POLAR GLACIER W/PUMP (MISCELLANEOUS) ×2 IMPLANT
COVER LIGHT HANDLE UNIVERSAL (MISCELLANEOUS) ×4 IMPLANT
DERMABOND ADVANCED (GAUZE/BANDAGES/DRESSINGS) ×1
DERMABOND ADVANCED .7 DNX12 (GAUZE/BANDAGES/DRESSINGS) IMPLANT
DRAPE INCISE IOBAN 66X45 STRL (DRAPES) ×2 IMPLANT
DRAPE U-SHAPE 48X52 POLY STRL (PACKS) ×2 IMPLANT
DRSG TEGADERM 4X4.75 (GAUZE/BANDAGES/DRESSINGS) ×7 IMPLANT
ELECT REM PT RETURN 9FT ADLT (ELECTROSURGICAL)
ELECTRODE REM PT RTRN 9FT ADLT (ELECTROSURGICAL) IMPLANT
GAUZE SPONGE 4X4 12PLY STRL (GAUZE/BANDAGES/DRESSINGS) ×2 IMPLANT
GAUZE XEROFORM 1X8 LF (GAUZE/BANDAGES/DRESSINGS) ×2 IMPLANT
GLOVE SRG 8 PF TXTR STRL LF DI (GLOVE) ×3 IMPLANT
GLOVE SURG ENC MOIS LTX SZ7.5 (GLOVE) ×8 IMPLANT
GLOVE SURG UNDER POLY LF SZ8 (GLOVE) ×6
GOWN STRL REIN 2XL XLG LVL4 (GOWN DISPOSABLE) ×3 IMPLANT
GOWN STRL REUS W/ TWL LRG LVL3 (GOWN DISPOSABLE) ×3 IMPLANT
GOWN STRL REUS W/TWL LRG LVL3 (GOWN DISPOSABLE) ×6
IMPL REGENETEN MEDIUM (Shoulder) IMPLANT
IMPLANT REGENETEN MEDIUM (Shoulder) ×2 IMPLANT
IV LACTATED RINGER IRRG 3000ML (IV SOLUTION) ×8
IV LR IRRIG 3000ML ARTHROMATIC (IV SOLUTION) ×6 IMPLANT
KIT STABILIZATION SHOULDER (MISCELLANEOUS) ×2 IMPLANT
KIT TURNOVER KIT A (KITS) ×2 IMPLANT
MANIFOLD NEPTUNE II (INSTRUMENTS) ×2 IMPLANT
MASK FACE SPIDER DISP (MASK) ×2 IMPLANT
MAT GRAY ABSORB FLUID 28X50 (MISCELLANEOUS) ×4 IMPLANT
PACK ARTHROSCOPY SHOULDER (MISCELLANEOUS) ×2 IMPLANT
PAD ABD DERMACEA PRESS 5X9 (GAUZE/BANDAGES/DRESSINGS) ×4 IMPLANT
PAD WRAPON POLAR SHDR XLG (MISCELLANEOUS) ×1 IMPLANT
SPONGE T-LAP 18X18 ~~LOC~~+RFID (SPONGE) ×2 IMPLANT
SUT ETHILON 3-0 FS-10 30 BLK (SUTURE) ×2
SUT MNCRL 4-0 (SUTURE) ×2
SUT MNCRL 4-0 27XMFL (SUTURE) ×1
SUT VIC AB 0 CT1 36 (SUTURE) ×2 IMPLANT
SUT VIC AB 2-0 CT2 27 (SUTURE) IMPLANT
SUT VIC AB 3-0 SH 27 (SUTURE) ×2
SUT VIC AB 3-0 SH 27X BRD (SUTURE) IMPLANT
SUTURE EHLN 3-0 FS-10 30 BLK (SUTURE) ×1 IMPLANT
SUTURE MNCRL 4-0 27XMF (SUTURE) IMPLANT
SYSTEM FBRTK BICEPS 1.9 DRILL (Anchor) ×1 IMPLANT
TAPE MICROFOAM 4IN (TAPE) ×2 IMPLANT
TUBING CONNECTING 10 (TUBING) ×2 IMPLANT
TUBING INFLOW SET DBFLO PUMP (TUBING) ×2 IMPLANT
TUBING OUTFLOW SET DBLFO PUMP (TUBING) ×2 IMPLANT
WAND WEREWOLF FLOW 90D (MISCELLANEOUS) ×2 IMPLANT
WRAPON POLAR PAD SHDR XLG (MISCELLANEOUS) ×2

## 2022-02-06 NOTE — Op Note (Signed)
SURGERY DATE: 02/06/2022   PRE-OP DIAGNOSIS:  1. Left subacromial impingement 2. Left biceps tendinopathy 3. Left partial-thickness rotator cuff tear  POST-OP DIAGNOSIS: 1. Left subacromial impingement 2. Left biceps tendinopathy 3. Left partial-thickness rotator cuff tear  PROCEDURES:  1. Left arthroscopic rotator cuff repair with Regeneten patch 2. Left open subpectoral biceps tenodesis 3. Left extensive debridement of shoulder (glenohumeral and subacromial spaces) 4. Left subacromial decompression  SURGEON: Rosealee Albee, MD  ANESTHESIA: Gen with interscalene Exparel block  ESTIMATED BLOOD LOSS: 10cc  DRAINS:  none  TOTAL IV FLUIDS: per anesthesia   SPECIMENS: none  IMPLANTS:  - [employer] Regeneten patch with associated tendon and bone staples - Arthrex - Double loaded FiberTak Suture Anchor - x1  OPERATIVE FINDINGS:  Examination under anesthesia: A careful examination under anesthesia was performed.  Passive range of motion was: FF: 160; ER at side: 70; ER in abduction: NT; IR in abduction: NT.  Anterior load shift: NT.  Posterior load shift: NT.  Sulcus in neutral: NT.  Sulcus in ER: NT.    Intra-operative findings: A thorough arthroscopic examination of the shoulder was performed.  The findings are: 1. Biceps tendon: tendinopathy with erythema and degenerative appearance 2. Superior labrum: Erythema 3. Posterior labrum and capsule: normal 4. Inferior capsule and inferior recess: normal 5. Glenoid cartilage surface: Normal 6. Supraspinatus attachment: normal on articular surface, bursal sided tear involving approximately 25% of the depth of the supraspinatus 7. Posterior rotator cuff attachment: normal 8. Humeral head articular cartilage: normal 9. Rotator interval: Synovitic 10: Subscapularis tendon: normal 11. Anterior labrum: degenerative 12. IGHL: normal  OPERATIVE REPORT:   Indications for procedure: Morgan Stevens is a 56 y.o. year old female with  over 2 years L shoulder pain that has failed nonoperative management including rest, activity modification, physical therapy, and/or corticosteroid injection.  Ultrasound-guided biceps tendon sheath corticosteroid injection gave the patient excellent relief of her symptoms, but only for 1 week.  Additionally, clinical exam and MRI were consistent with findings of biceps tendon pathology, significant bursitis/subacromial impingement, and possible partial-thickness rotator cuff tear.  After discussion of risks, benefits, and alternatives to surgery, the patient elected to proceed with above mentioned procedures. The patient understands that use of the Regeneten patch is relatively new and long-term data is unknown.  Procedure in detail:  I identified Blaze Domitrovich in the pre-operative holding area.  I marked the operative shoulder with my initials. I reviewed the risks and benefits of the proposed surgical intervention, and the patient (and/or patient's guardian) wished to proceed.  Anesthesia was then performed with an Exparel interscalene block.  The patient was transferred to the operative suite and placed in the beach chair position.    SCDs were placed on the lower extremities. Appropriate IV antibiotics were administered within 1 hour before incision. The operative upper extremity was then prepped and draped in standard fashion. A time out was performed confirming the correct extremity, correct patient and correct procedure.   I then created a standard posterior portal with an 11 blade. The glenohumeral joint was easily entered with a blunt trochar and the arthroscope introduced. The findings of diagnostic arthroscopy are described above. I debrided degenerative tissue including anterior labrum, superior labrum, and rotator interval tissue.  I then coagulated the inflamed synovium to obtain hemostasis and reduce the risk of post-operative swelling using an Arthrocare radiofrequency device.  I performed a  biceps tenotomy using an ArthroCare wand, and I then used a motorized shaver to debride  the stump back to a stable base.   I then directed my attention to the open subpectoral biceps tenodesis. I made an ~5cm incision parallel to the skin relaxation lines along the medial upper arm, such that 1/3 of the incision was above the pectoralis major tendon and 2/3 below it. Sharp dissection was carried down to the fascia overlying the pectoralis and the short head of the biceps. Bovie electrocautery was used to achieve hemostasis. I made a vertical nick in the fascia in line with the short head of the biceps. The pectoralis tendon was then retracted with a large Hohmann retractor. An [employer] retractor was used to retract inferiorly. This afforded excellent visualization of the long head of the biceps tendon. It was delivered out of the incision. I then placed a double-loaded Arthrex FiberTak all-suture anchor at the most proximal and central portion of the inter-tubercular groove that I could visualize. I took one set of tapes and with the use of a free needle, passed one strand through the biceps tendon once and passed the other strand through the biceps in a locking fashion. Suture tapes were passed through the biceps tendon at the level of the musculocutaneous junction. This was also done for the other tape as well. This configuration allowed me to then parachute the tendon back into the wound as the two ends of the suture tape were tied together with a Surgeon's knot. This was repeated for the other strand of suture tape. The excess proximal tendon was sharply excised. The tension in the tenodesed long head was excellent. The wound was thoroughly irrigated with saline, and a wet lap sponge was placed in the wound.  Next, the arthroscope was then introduced into the subacromial space. A direct lateral portal was created with an 11-blade after spinal needle localization.  There was extensive subacromial/subdeltoid  bursitis.  An extensive subacromial bursectomy and debridement was performed using a combination of the shaver and Arthrocare wand. The entire acromial undersurface was exposed and the CA ligament was subperiosteally elevated to expose the anterior acromial hook. A burr was used to create a flat anterior and lateral aspect of the acromion, converting it from a Type 2 to a Type 1 acromion. Care was made to keep the deltoid fascia intact.  Then, I examined the supraspinatus tendon. There was a bursal sided tear involving approximately 25% of the depth of the supraspinatus.  I extensively probed this area and there was no region of full-thickness defect. We decided to proceed with Regeneten patch placement. Anterior and posterior cannulas were placed just lateral to the acromion. The Regeneten patch delivery gun was placed into the lateral portal and patch was delivered over the torn region of the supraspinatus. Tendon staples were placed medially, anteriorly, and posteriorly through the anterior and posterior cannulas.  Bone staples were then placed laterally. The patch was then probed to confirm appropriate stability.   Fluid was evacuated from the shoulder.  The biceps tenodesis incision was closed in layers using 3-0 Vicryl for the deep dermis and a running subcuticular 4-0 Monocryl for skin. A thin layer of Dermabond was applied.  The arthroscopic portals were closed with 3-0 Nylon. Xeroform was applied to the portals. A sterile dressing was applied, followed by a Polar Care sleeve and a SlingShot shoulder immobilizer/sling. The patient was awakened from anesthesia without difficulty and transferred to the PACU in stable condition.    COMPLICATIONS: none  DISPOSITION: plan for discharge home after recovery in PACU   POSTOPERATIVE  PLAN: Remain in sling (except hygiene and elbow/wrist/hand RoM exercises as instructed by PT) x 4 weeks and NWB for this time. PT to begin 3-4 days after surgery. Use open  biceps tenodesis rehab protocol as Regeneten patch protocol is too aggressive for biceps tenodesis healing.

## 2022-02-06 NOTE — Anesthesia Procedure Notes (Signed)
Anesthesia Regional Block: Interscalene brachial plexus block   Pre-Anesthetic Checklist: , timeout performed,  Correct Patient, Correct Site, Correct Laterality,  Correct Procedure, Correct Position, site marked,  Risks and benefits discussed,  Surgical consent,  Pre-op evaluation,  At surgeon's request and post-op pain management  Laterality: Left  Prep: chloraprep       Needles:  Injection technique: Single-shot  Needle Type: Stimiplex     Needle Length: 10cm  Needle Gauge: 21     Additional Needles:   Procedures:,,,, ultrasound used (permanent image in chart),,    Narrative:  Start time: 02/06/2022 7:05 AM End time: 02/06/2022 7:12 AM Injection made incrementally with aspirations every 5 mL.  Performed by: Personally  Anesthesiologist: Ranee Gosselin, MD  Additional Notes: Functioning IV was confirmed and monitors applied. Ultrasound guidance: relevant anatomy identified, needle position confirmed, local anesthetic spread visualized around nerve(s)., vascular puncture avoided.  Image printed for medical record.  Negative aspiration and no paresthesias; incremental administration of local anesthetic. The patient tolerated the procedure well. Vitals signes recorded in RN notes.

## 2022-02-06 NOTE — Transfer of Care (Signed)
Immediate Anesthesia Transfer of Care Note  Patient: Morgan Stevens  Procedure(s) Performed: Left shoulder arthroscopic debridement, subacromial decompression, open subpectoral biceps tenodesis, and  Regeneten Patch application (Left: Shoulder)  Patient Location: PACU  Anesthesia Type: General  Level of Consciousness: awake, alert  and patient cooperative  Airway and Oxygen Therapy: Patient Spontanous Breathing and Patient connected to supplemental oxygen  Post-op Assessment: Post-op Vital signs reviewed, Patient's Cardiovascular Status Stable, Respiratory Function Stable, Patent Airway and No signs of Nausea or vomiting  Post-op Vital Signs: Reviewed and stable  Complications: No notable events documented.

## 2022-02-06 NOTE — H&P (Signed)
Paper H&P to be scanned into permanent record. H&P reviewed. No significant changes noted.  

## 2022-02-06 NOTE — Anesthesia Preprocedure Evaluation (Signed)
Anesthesia Evaluation  Patient identified by MRN, date of birth, ID band Patient awake    Reviewed: Allergy & Precautions, H&P , NPO status , Patient's Chart, lab work & pertinent test results  Airway Mallampati: II  TM Distance: >3 FB Neck ROM: full    Dental no notable dental hx.    Pulmonary    Pulmonary exam normal breath sounds clear to auscultation       Cardiovascular hypertension, Normal cardiovascular exam Rhythm:regular Rate:Normal     Neuro/Psych    GI/Hepatic GERD  ,  Endo/Other  Hypothyroidism Morbid obesity  Renal/GU      Musculoskeletal   Abdominal   Peds  Hematology   Anesthesia Other Findings   Reproductive/Obstetrics                             Anesthesia Physical Anesthesia Plan  ASA: 3  Anesthesia Plan: General   Post-op Pain Management: Minimal or no pain anticipated and Regional block   Induction:   PONV Risk Score and Plan: 3 and Treatment may vary due to age or medical condition, Ondansetron, Dexamethasone and Midazolam  Airway Management Planned:   Additional Equipment:   Intra-op Plan:   Post-operative Plan:   Informed Consent: I have reviewed the patients History and Physical, chart, labs and discussed the procedure including the risks, benefits and alternatives for the proposed anesthesia with the patient or authorized representative who has indicated his/her understanding and acceptance.     Dental Advisory Given  Plan Discussed with: CRNA  Anesthesia Plan Comments:         Anesthesia Quick Evaluation

## 2022-02-06 NOTE — Anesthesia Procedure Notes (Signed)
Procedure Name: LMA Insertion Date/Time: 02/06/2022 7:44 AM  Performed by: Jimmy Picket, CRNAPre-anesthesia Checklist: Patient identified, Emergency Drugs available, Suction available, Timeout performed and Patient being monitored Patient Re-evaluated:Patient Re-evaluated prior to induction Oxygen Delivery Method: Circle system utilized Preoxygenation: Pre-oxygenation with 100% oxygen Induction Type: IV induction LMA: LMA inserted LMA Size: 4.0 Number of attempts: 1 Placement Confirmation: positive ETCO2 and breath sounds checked- equal and bilateral Tube secured with: Tape

## 2022-02-06 NOTE — Discharge Instructions (Addendum)
Post-Op Instructions - Arthroscopic Shoulder Surgery with Biceps Tenodesis  1. Bracing: You will wear a shoulder immobilizer or sling for 4 weeks.   2. Driving: When driving, do not wear the immobilizer. Ideally, we recommend no driving for 4 weeks while sling is in place as one arm will be immobilized.   3. Activity: No active lifting for 6 weeks. Wrist, hand, and elbow motion only. You are permitted to bend and straighten the elbow passively only (no active elbow motion). You may use your hand and wrist for typing, writing, and managing utensils (cutting food). Do not lift more than a coffee cup for 8 weeks.  When sleeping or resting, inclined positions (recliner chair or wedge pillow) and a pillow under the forearm for support may provide better comfort for up to 4 weeks.  Avoid long distance travel for 4 weeks.  Return to normal activities normally takes 4 months on average. If rehab goes very well, may be able to do most activities at 3 months, except overhead or contact sports.  4. Physical Therapy: Begins 3-4 days after surgery, and proceed 1 time per week for the first 6 weeks, then 1-2 times per week from weeks 6-20 post-op.  5. Medications:  - You will be provided a prescription for narcotic pain medicine. After surgery, take 1-2 narcotic tablets every 4 hours if needed for severe pain.  - A prescription for anti-nausea medication will be provided in case the narcotic medicine causes nausea - take 1 tablet every 6 hours only if nauseated.   - Take tylenol 1000 mg (2 Extra Strength tablets or 3 regular strength) every 8 hours for pain.  May decrease or stop tylenol 5 days after surgery if you are having minimal pain. - Take ASA '325mg'$ /day x 2 weeks to help prevent DVTs/PEs (blood clots).    If you are taking prescription medication for anxiety, depression, insomnia, muscle spasm, chronic pain, or for attention deficit disorder, you are advised that you are at a higher risk of adverse  effects with use of narcotics post-op, including narcotic addiction/dependence, depressed breathing, death. If you use non-prescribed substances: alcohol, marijuana, cocaine, heroin, methamphetamines, etc., you are at a higher risk of adverse effects with use of narcotics post-op, including narcotic addiction/dependence, depressed breathing, death. You are advised that taking > 50 morphine milligram equivalents (MME) of narcotic pain medication per day results in twice the risk of overdose or death. For your prescription provided: oxycodone 5 mg - taking more than 6 tablets per day would result in > 50 morphine milligram equivalents (MME) of narcotic pain medication. Be advised that we will prescribe narcotics short-term, for acute post-operative pain only - 3 weeks for major operations such as shoulder repair/reconstruction surgeries.    6. Post-Op Appointment:  Your first post-op appointment will be 10-14 days post-op.  7. Work or School: For most, but not all procedures, we advise staying out of work or school for at least 1 to 2 weeks in order to recover from the stress of surgery and to allow time for healing.   If you need a work or school note this can be provided.   8. Smoking: If you are a smoker, you need to refrain from smoking in the postoperative period. The nicotine in cigarettes will inhibit healing of your shoulder repair and decrease the chance of successful repair. Similarly, nicotine containing products (gum, patches) should be avoided.   Post-operative Brace: Apply and remove the brace you received as you were instructed  to at the time of fitting and as described in detail as the brace's instructions for use indicate.  Wear the brace for the period of time prescribed by your physician.  The brace can be cleaned with soap and water and allowed to air dry only.  Should the brace result in increased pain, decreased feeling (numbness/tingling), increased swelling or an overall  worsening of your medical condition, please contact your doctor immediately.  If an emergency situation occurs as a result of wearing the brace after normal business hours, please dial 911 and seek immediate medical attention.  Let your doctor know if you have any further questions about the brace issued to you. Refer to the shoulder sling instructions for use if you have any questions regarding the correct fit of your shoulder sling.  [facility] Customer Care for Troubleshooting: [phone]  Video that illustrates how to properly use a shoulder sling: "Instructions for Proper Use of an Orthopaedic Sling" http://bass.com/                PERIPHERAL NERVE BLOCK PATIENT INFORMATION  Your surgeon has requested a peripheral nerve block for your surgery. This anesthetic technique provides excellent post-operative pain relief for you in a safe and effective manner. It will also help reduce the risk of nausea and vomiting and allow earlier discharge from the hospital.   The block is performed under sedation with ultrasound guidance prior to your procedure. Due to the sedation, your may or may not remember the block experience. The nerve block will begin to take effect anywhere from 5 to 30 minutes after being administered. You will be transported to the operating room from your surgery after the block is completed.   At the end of surgery, when the anesthesia wears off, you will notice a few things. Your may not be able to move or feel the part of your body targeted by the nerve block. These are normal experiences, and they will disappear as the block wears off.  If you had an interscalene nerve block performed (which is common for shoulder surgery), your voice can be very hoarse and you may feel that you are not able to take as deep a breath as you did before surgery. Some patients may also notice a droopy eyelid on the affected side. These symptoms will resolve once the  block wears off.  Pain control: The nerve block technique used is a single injection that can last anywhere from 1-3 days. The duration of the numbness can vary between individuals. After leaving the hospital, it is important that you begin to take your prescribed pain medication when you start to sense the nerve block wearing off. This will help you avoid unpleasant pain at the time the nerve block wears off, which can sometimes be in the middle of the night. The block will only cover pain in the areas targeted by the nerve block so if you experience surgical pain outside of that area, please take your prescribed pain medication. Management of the "numb area": After a nerve block, you cannot feel pain, pressure, or temperature in the affected area so there is an increased risk for injury. You should take extra care to protect the affected areas until sensation and movement returns. Please take caution to not come in contact with extremely hot or cold items because you will not be able to sense or protect yourself form the extremes of temperature.  You may experience some persistent numbness after the procedure by most neurological deficits  resolve over time and the incidence of serious long term neurological complications attributable to peripheral nerve blocks are relatively uncommon.    Information for Discharge Teaching: EXPAREL (bupivacaine liposome injectable suspension)   Your surgeon or anesthesiologist gave you EXPAREL(bupivacaine) to help control your pain after surgery.  EXPAREL is a local anesthetic that provides pain relief by numbing the tissue around the surgical site. EXPAREL is designed to release pain medication over time and can control pain for up to 72 hours. Depending on how you respond to EXPAREL, you may require less pain medication during your recovery.  Possible side effects: Temporary loss of sensation or ability to move in the area where bupivacaine was injected. Nausea,  vomiting, constipation Rarely, numbness and tingling in your mouth or lips, lightheadedness, or anxiety may occur. Call your doctor right away if you think you may be experiencing any of these sensations, or if you have other questions regarding possible side effects.  Follow all other discharge instructions given to you by your surgeon or nurse. Eat a healthy diet and drink plenty of water or other fluids.  If you return to the hospital for any reason within 96 hours following the administration of EXPAREL, it is important for health care providers to know that you have received this anesthetic. A teal colored band has been placed on your arm with the date, time and amount of EXPAREL you have received in order to alert and inform your health care providers. Please leave this armband in place for the full 96 hours following administration, and then you may remove the band.  POLAR CARE INFORMATION  [email or web address]  How to use [employer] Polar Care [facility] Cold Therapy [facility]?  YouTube   ShippingScam.co.ukhttps://www.youtube.com/watch?v=E5pJtyfj4co  OPERATING INSTRUCTIONS  Start the product With dry hands, connect the transformer to the electrical connection located on the top of the cooler. Next, plug the transformer into an appropriate electrical outlet. The unit will automatically start running at this point.  To stop the pump, disconnect electrical power.  Unplug to stop the product when not in use. Unplugging the Polar Care unit turns it off. Always unplug immediately after use. Never leave it plugged in while unattended. Remove pad.    FIRST ADD WATER TO FILL LINE, THEN ICE---Replace ice when existing ice is almost melted  1 Discuss Treatment with your Licensed Health Care Practitioner and Use Only as Prescribed 2 Apply Insulation Barrier & Cold Therapy Pad 3 Check for Moisture 4 Inspect Skin Regularly  Tips and Trouble Shooting Usage Tips 1. Use cubed or chunked ice for optimal performance. 2. It is  recommended to drain the Pad between uses. To drain the pad, hold the Pad upright with the hose pointed toward the ground. Depress the black plunger and allow water to drain out. 3. You may disconnect the Pad from the unit without removing the pad from the affected area by depressing the silver tabs on the hose coupling and gently pulling the hoses apart. The Pad and unit will seal itself and will not leak. Note: Some dripping during release is normal. 4. DO NOT RUN PUMP WITHOUT WATER! The pump in this unit is designed to run with water. Running the unit without water will cause permanent damage to the pump. 5. Unplug unit before removing lid.  TROUBLESHOOTING GUIDE Pump not running, Water not flowing to the pad, Pad is not getting cold 1. Make sure the transformer is plugged into the wall outlet. 2. Confirm that the ice and water are  filled to the indicated levels. 3. Make sure there are no kinks in the pad. 4. Gently pull on the blue tube to make sure the tube/pad junction is straight. 5. Remove the pad from the treatment site and ll it while the pad is lying at; then reapply. 6. Confirm that the pad couplings are securely attached to the unit. Listen for the double clicks (Figure 1) to confirm the pad couplings are securely attached.  Leaks    Note: Some condensation on the lines, controller, and pads is unavoidable, especially in warmer climates. 1. If using a Breg Polar Care Cold Therapy unit with a detachable Cold Therapy Pad, and a leak exists (other than condensation on the lines) disconnect the pad couplings. Make sure the silver tabs on the couplings are depressed before reconnecting the pad to the pump hose; then confirm both sides of the coupling are properly clicked in. 2. If the coupling continues to leak or a leak is detected in the pad itself, stop using it and call B[employer] Customer Care at [phone].  Cleaning After use, empty and dry the unit with a soft cloth. Warm water and  mild detergent may be used occasionally to clean the pump and tubes.  WARNING: The Polar Care Cube can be cold enough to cause serious injury, including full skin necrosis. Follow these Operating Instructions, and carefully read the Product Insert (see pouch on side of unit) and the Cold Therapy Pad Fitting Instructions (provided with each Cold Therapy Pad) prior to use.

## 2022-02-06 NOTE — Progress Notes (Signed)
Assisted Delorise Jackson ANMD with left, interscalene , ultrasound guided block. Side rails up, monitors on throughout procedure. See vital signs in flow sheet. Tolerated Procedure well.

## 2022-02-06 NOTE — Anesthesia Postprocedure Evaluation (Signed)
Anesthesia Post Note  Patient: Joeann Steppe  Procedure(s) Performed: Left shoulder arthroscopic debridement, subacromial decompression, open subpectoral biceps tenodesis, and  Regeneten Patch application (Left: Shoulder)     Patient location during evaluation: PACU Anesthesia Type: General Level of consciousness: awake and alert and oriented Pain management: satisfactory to patient Vital Signs Assessment: post-procedure vital signs reviewed and stable Respiratory status: spontaneous breathing, nonlabored ventilation and respiratory function stable Cardiovascular status: blood pressure returned to baseline and stable Postop Assessment: Adequate PO intake and No signs of nausea or vomiting Anesthetic complications: no   No notable events documented.  Cherly Beach

## 2022-02-07 ENCOUNTER — Encounter: Payer: Self-pay | Admitting: Orthopedic Surgery

## 2022-02-13 DIAGNOSIS — M67814 Other specified disorders of tendon, left shoulder: Secondary | ICD-10-CM | POA: Diagnosis not present

## 2022-02-21 DIAGNOSIS — M67814 Other specified disorders of tendon, left shoulder: Secondary | ICD-10-CM | POA: Diagnosis not present

## 2022-03-05 ENCOUNTER — Telehealth: Payer: Self-pay | Admitting: Family Medicine

## 2022-03-05 NOTE — Telephone Encounter (Signed)
..   Medicaid Managed Care   Unsuccessful Outreach Note  03/05/2022 Name: Saarah Dewing MRN: [id number] DOB: [birth year]  Referred by: [name], DO Reason for referral : High Risk Managed Medicaid (I called the patient today to get her scheduled with the MM Team. I left my name and number on her VM.)   An unsuccessful telephone outreach was attempted today. The patient was referred to the case management team for assistance with care management and care coordination.   Follow Up Plan: The care management team will reach out to the patient again over the next 7-14 days.     Weston Settle Care Guide, High Risk Medicaid Managed Care Embedded Care Coordination [facility]  [facility]    SIGNATURE

## 2022-03-07 DIAGNOSIS — M67814 Other specified disorders of tendon, left shoulder: Secondary | ICD-10-CM | POA: Diagnosis not present

## 2022-03-08 ENCOUNTER — Ambulatory Visit: Payer: Medicaid Other | Admitting: Family Medicine

## 2022-03-18 ENCOUNTER — Encounter: Payer: Self-pay | Admitting: Family Medicine

## 2022-03-19 ENCOUNTER — Emergency Department
Admission: EM | Admit: 2022-03-19 | Discharge: 2022-03-19 | Disposition: A | Discharge status: Home / Self Care | Payer: Medicaid Other | Attending: Student in an Organized Health Care Education/Training Program | Admitting: Student in an Organized Health Care Education/Training Program

## 2022-03-19 ENCOUNTER — Ambulatory Visit: Payer: Medicaid Other | Admitting: Family Medicine

## 2022-03-19 ENCOUNTER — Other Ambulatory Visit: Payer: Self-pay

## 2022-03-19 ENCOUNTER — Emergency Department: Payer: Medicaid Other

## 2022-03-19 DIAGNOSIS — R531 Weakness: Secondary | ICD-10-CM | POA: Diagnosis not present

## 2022-03-19 DIAGNOSIS — R051 Acute cough: Secondary | ICD-10-CM

## 2022-03-19 DIAGNOSIS — U071 COVID-19: Secondary | ICD-10-CM | POA: Diagnosis not present

## 2022-03-19 DIAGNOSIS — I10 Essential (primary) hypertension: Secondary | ICD-10-CM | POA: Diagnosis not present

## 2022-03-19 DIAGNOSIS — R509 Fever, unspecified: Secondary | ICD-10-CM | POA: Diagnosis not present

## 2022-03-19 DIAGNOSIS — R0602 Shortness of breath: Secondary | ICD-10-CM | POA: Diagnosis not present

## 2022-03-19 DIAGNOSIS — R059 Cough, unspecified: Secondary | ICD-10-CM | POA: Diagnosis not present

## 2022-03-19 DIAGNOSIS — R000 Tachycardia, unspecified: Secondary | ICD-10-CM | POA: Diagnosis not present

## 2022-03-19 LAB — CBC WITH DIFFERENTIAL/PLATELET
Abs Immature Granulocytes: 0.02 10*3/uL (ref 0.00–0.07)
Basophils Absolute: 0.1 10*3/uL (ref 0.0–0.1)
Basophils Relative: 1 %
Eosinophils Absolute: 0.1 10*3/uL (ref 0.0–0.5)
Eosinophils Relative: 1 %
HCT: 40.1 % (ref 36.0–46.0)
Hemoglobin: 13.7 g/dL (ref 12.0–15.0)
Immature Granulocytes: 0 %
Lymphocytes Relative: 13 %
Lymphs Abs: 1 10*3/uL (ref 0.7–4.0)
MCH: 30.4 pg (ref 26.0–34.0)
MCHC: 34.2 g/dL (ref 30.0–36.0)
MCV: 88.9 fL (ref 80.0–100.0)
Monocytes Absolute: 0.6 10*3/uL (ref 0.1–1.0)
Monocytes Relative: 8 %
Neutro Abs: 6 10*3/uL (ref 1.7–7.7)
Neutrophils Relative %: 77 %
Platelets: 255 10*3/uL (ref 150–400)
RBC: 4.51 MIL/uL (ref 3.87–5.11)
RDW: 13 % (ref 11.5–15.5)
WBC: 7.8 10*3/uL (ref 4.0–10.5)
nRBC: 0 % (ref 0.0–0.2)

## 2022-03-19 LAB — BASIC METABOLIC PANEL
Anion gap: 10 (ref 5–15)
BUN: 9 mg/dL (ref 6–20)
CO2: 25 mmol/L (ref 22–32)
Calcium: 9.2 mg/dL (ref 8.9–10.3)
Chloride: 103 mmol/L (ref 98–111)
Creatinine, Ser: 0.87 mg/dL (ref 0.44–1.00)
GFR, Estimated: >60 mL/min (ref >60)
Glucose, Bld: 125 mg/dL — ABNORMAL HIGH (ref 70–99)
Potassium: 3.7 mmol/L (ref 3.5–5.1)
Sodium: 138 mmol/L (ref 135–145)

## 2022-03-19 LAB — LACTIC ACID, PLASMA: Lactic Acid, Venous: 0.5 mmol/L (ref 0.5–1.9)

## 2022-03-19 LAB — RESP PANEL BY RT-PCR (FLU A&B, COVID) ARPGX2
Influenza A by PCR: NEGATIVE
Influenza B by PCR: NEGATIVE
SARS Coronavirus 2 by RT PCR: POSITIVE — AB

## 2022-03-19 LAB — D-DIMER, QUANTITATIVE: D-Dimer, Quant: <0.27 ug/mL-FEU (ref 0.00–0.50)

## 2022-03-19 LAB — TROPONIN I (HIGH SENSITIVITY): Troponin I (High Sensitivity): 4 ng/L (ref <18)

## 2022-03-19 MED ORDER — IBUPROFEN 600 MG PO TABS
600.0000 mg | ORAL_TABLET | Freq: Once | ORAL | Status: AC
  Administered 2022-03-19: 600 mg via ORAL
  Filled 2022-03-19: qty 1

## 2022-03-19 MED ORDER — NIRMATRELVIR/RITONAVIR (PAXLOVID)TABLET: 3.0000 | ORAL_TABLET | Freq: Two times a day (BID) | ORAL | 0 refills | Status: AC

## 2022-03-19 MED ORDER — SODIUM CHLORIDE 0.9 % IV BOLUS
1000.0000 mL | Freq: Once | INTRAVENOUS | Status: AC
  Administered 2022-03-19: 1000 mL via INTRAVENOUS

## 2022-03-19 NOTE — ED Provider Notes (Signed)
[facility] Provider Note    Event Date/Time   First MD Initiated Contact with Patient 03/19/22 1104     (approximate)   History   Shortness of Breath and Weakness   HPI  Morgan Stevens is a 56 y.o. female   with a history of GERD and esophagitis presents to the ER for evaluation of productive cough fevers chills palpitations over the past 24 hours.  Denies any tightness.  Left upper extremity is in sling immobilizer has not noted any lower extremity swelling not any blood thinners.  Denies any history of atrial fibrillation.      Physical Exam   Triage Vital Signs: ED Triage Vitals  Enc Vitals Group     BP 03/19/22 0856 134/76     Pulse Rate 03/19/22 0856 73     Resp 03/19/22 0856 16     Temp 03/19/22 0856 (!) 100.5 F (38.1 C)     Temp Source 03/19/22 0856 Oral     SpO2 03/19/22 0856 97 %     Weight 03/19/22 0903 241 lb (109.3 kg)     Height 03/19/22 0903 5\' 4"  (1.626 m)     Head Circumference --      Peak Flow --      Pain Score 03/19/22 0903 0     Pain Loc --      Pain Edu? --      Excl. in GC? --     Most recent vital signs: Vitals:   03/19/22 1134 03/19/22 1317  BP: (!) 144/81 135/67  Pulse: (!) 118 (!) 111  Resp: 20 20  Temp:  99.9 F (37.7 C)  SpO2: 96% 94%     Constitutional: Alert  Eyes: Conjunctivae are normal.  Head: Atraumatic. Nose: No congestion/rhinnorhea. Mouth/Throat: Mucous membranes are moist.   Neck: Painless ROM.  Cardiovascular:   Good peripheral circulation.  Mildly tachycardic but overall well perfused Respiratory: Normal respiratory effort.  No expiratory crackles or wheeze. Gastrointestinal: Soft and nontender.  Musculoskeletal:  no deformity Neurologic:  MAE spontaneously. No gross focal neurologic deficits are appreciated.  Skin:  Skin is warm, dry and intact. No rash noted. Psychiatric: Mood and affect are normal. Speech and behavior are normal.    ED Results / Procedures / Treatments    Labs (all labs ordered are listed, but only abnormal results are displayed) Labs Reviewed  RESP PANEL BY RT-PCR (FLU A&B, COVID) ARPGX2 - Abnormal; Notable for the following components:      Result Value   SARS Coronavirus 2 by RT PCR POSITIVE (*)    All other components within normal limits  BASIC METABOLIC PANEL - Abnormal; Notable for the following components:   Glucose, Bld 125 (*)    All other components within normal limits  CBC WITH DIFFERENTIAL/PLATELET  LACTIC ACID, PLASMA  D-DIMER, QUANTITATIVE  URINALYSIS, ROUTINE W REFLEX MICROSCOPIC  LACTIC ACID, PLASMA  TROPONIN I (HIGH SENSITIVITY)  TROPONIN I (HIGH SENSITIVITY)     EKG  ED ECG REPORT I, 03/21/22, the attending physician, personally viewed and interpreted this ECG.   Date: 03/19/2022  EKG Time: 8:53  Rate: 120  Rhythm: sinus  Axis: normal  Intervals: normal qt  ST&T Change: no stemi, occasional pacs    RADIOLOGY Please see ED Course for my review and interpretation.  I personally reviewed all radiographic images ordered to evaluate for the above acute complaints and reviewed radiology reports and findings.  These findings were personally discussed with the  patient.  Please see medical record for radiology report.    PROCEDURES:  Critical Care performed: No  Procedures   MEDICATIONS ORDERED IN ED: Medications  ibuprofen (ADVIL) tablet 600 mg (600 mg Oral Given 03/19/22 1136)  sodium chloride 0.9 % bolus 1,000 mL (1,000 mLs Intravenous New Bag/Given 03/19/22 1144)     IMPRESSION / MDM / ASSESSMENT AND PLAN / ED COURSE  I reviewed the triage vital signs and the nursing notes.                              Differential diagnosis includes, but is not limited to, PNA, covid, ACS, pericarditis, esophagitis, pe, dissection, pna, bronchitis, costochondritis  She presented to the ER for evaluation of symptoms as described above she is febrile but well perfused mildly tachycardic.  This  presenting complaint could reflect a potentially life-threatening illness therefore the patient will be placed on continuous pulse oximetry and telemetry for monitoring.  Laboratory evaluation will be sent to evaluate for the above complaints.      Clinical Course as of 03/19/22 1339  Mon Mar 19, 2022  1131 Chest x-ray on my review and interpretation does not show any evidence of infiltrate or consolidation. [PR]  1255 D-dimer is negative lactate negative not consistent with sepsis.  Given her presentation I do have a high suspicion for COVID and still awaiting that result. [PR]  1336 Patient's COVID is positive.  She is agreeable to initiating Paxlovid.  She is not hypoxic in no distress.  I do think she is appropriate for trial of outpatient management.  We discussed return precautions. [PR]    Clinical Course User Index [PR] Willy Eddy, MD    FINAL CLINICAL IMPRESSION(S) / ED DIAGNOSES   Final diagnoses:  COVID-19  Weakness  Acute cough     Rx / DC Orders   ED Discharge Orders          Ordered    nirmatrelvir/ritonavir EUA (PAXLOVID) 20 x 150 MG & 10 x 100MG  TABS  2 times daily        03/19/22 1338             Note:  This document was prepared using Dragon voice recognition software and may include unintentional dictation errors.    03/21/22, MD 03/19/22 1339

## 2022-03-19 NOTE — ED Notes (Signed)
Had to place second IV to get bloodwork. Second troponin drawn. Blue top drawn and sent as well.

## 2022-03-19 NOTE — ED Provider Triage Note (Signed)
Emergency Medicine Provider Triage Evaluation Note  Eyonna Sandstrom , a 56 y.o. female  was evaluated in triage.  Pt complains of presents to the ED with complaint of "heart racing".  Patient states that this started yesterday.  Patient denies any previous issues.  Not aware of fever until she arrived at the ED.  Review of Systems  Positive: Dizziness, congestion, cough 1 day Negative: No shortness of breath, chest pain, nausea or vomiting  Physical Exam  LMP 07/11/2020  Gen:   Awake, no distress   Resp:  Normal effort clear bilaterally MSK:   Moves extremities without difficulty  Other:    Medical Decision Making  Medically screening exam initiated at 8:56 AM.  Appropriate orders placed.  Elliannah Wayment was informed that the remainder of the evaluation will be completed by another provider, this initial triage assessment does not replace that evaluation, and the importance of remaining in the ED until their evaluation is complete.     Tommi Rumps, PA-C 03/19/22 [phone]

## 2022-03-19 NOTE — ED Notes (Signed)
Pt in with co weakness and shob when walks, CBG 118, brought by [facility] from home.

## 2022-03-19 NOTE — ED Triage Notes (Signed)
Pt c/o "heart racing" w/ ambulation, SOB, and weakness starting yesterday.  Denies pain.  Denies GU symptoms.  Pt reports productive cough and coughing up "big chunks of stuff."

## 2022-03-19 NOTE — ED Notes (Signed)
Good IV in place with IVF running, IV not giving enough blood for labs, will draw labs seperately.

## 2022-03-19 NOTE — ED Notes (Signed)
Pt to ED c/o SOB since yesterday. Surgery to L shoulder 6 wk ago. Denies chest pain. Has low grade fever.

## 2022-03-30 DIAGNOSIS — M67814 Other specified disorders of tendon, left shoulder: Secondary | ICD-10-CM | POA: Diagnosis not present

## 2022-04-04 ENCOUNTER — Encounter: Payer: Self-pay | Admitting: Family Medicine

## 2022-04-04 ENCOUNTER — Ambulatory Visit: Payer: Medicaid Other | Admitting: Family Medicine

## 2022-04-04 VITALS — BP 138/86 | HR 81 | Temp 98.5°F | Wt 238.0 lb

## 2022-04-04 DIAGNOSIS — M6208 Separation of muscle (nontraumatic), other site: Secondary | ICD-10-CM | POA: Diagnosis not present

## 2022-04-04 NOTE — Patient Instructions (Addendum)
Thank you for coming to the office today.  Diastasis Recti  Diastasis recti is a condition in which the muscles of the abdomen (rectus abdominis muscles) become thin and separate. The result is a wider space between the muscles of the right and left abdomen (abdominal muscles). This wider space between the muscles may cause a bulge in the middle of the abdomen. This bulge may be noticed when a person is straining or when he or she sits up after lying down. Diastasis recti can affect men and women. It is most common among pregnant women, babies, people with obesity, and people who have had abdominal surgery. Exercise or surgery may help correct this condition. What are the causes? Common causes of this condition include: Pregnancy. As the uterus grows in size, it puts pressure on the abdominal muscles, causing the muscles to separate. Obesity. Excess fat puts pressure on abdominal muscles. Weight lifting. Some exercises of the abdomen. Advanced age. Genetics. Having had surgery on the abdomen before. What increases the risk? This condition is more likely to develop in: Women. Newborns, especially newborns who are born early (prematurely). What are the signs or symptoms? Common symptoms of this condition include: A bulge in the middle of your abdomen. You will notice it most when you sit up or strain. Pain in your low back, hips, or the area between your hip bones (pelvis). Constipation. Being unable to control when you urinate (urinary incontinence). Bloating. Poor posture. How is this diagnosed? This condition is diagnosed with a physical exam. During the exam, your health care provider will ask you to lie flat on your back and do a crunch or half sit-up. If you have diastasis recti, a bulge will appear lengthwise between your abdominal muscles in the center of your abdomen. Your health care provider will measure the gap between your muscles with one of the following: A medical device  used to measure the space between two objects (caliper). A tape measure. CT scan. Ultrasound. Finger spaces. Your health care provider will measure the space using his or her fingers. How is this treated? If your muscle separation is not too large, you may not need treatment. However, if you are a woman who plans to become pregnant again, you should treat this condition before your next pregnancy. Treatment may include: Physical therapy exercises to strengthen and tighten your abdominal muscles. Lifestyle changes such as weight loss and exercise. Over-the-counter pain medicines as needed. Surgery to correct the separation. Follow these instructions at home: Activity Return to your normal activities as told by your health care provider. Ask your health care provider what activities are safe for you. Do exercises as told by your health care provider. Make sure you are doing your exercises and movements correctly when lifting weights or doing exercises using your abdominal muscles or the muscles in the center of your body that give stability (core muscles). Proper form can help to prevent this condition from happening again. General instructions If you are overweight, ask your health care provider for help with weight loss. Losing even a small amount of weight can help to improve your diastasis recti. Take over-the-counter or prescription medicines only as told by your health care provider. Do not strain. Straining can make the separation worse. Examples of straining include: Pushing hard to have a bowel movement, such as when you have constipation. Lifting heavy objects or lifting children. Standing up and sitting down. You may need to take these actions to prevent or treat constipation: Drink  enough fluid to keep your urine pale yellow. Take over-the-counter or prescription medicines. Eat foods that are high in fiber, such as beans, whole grains, and fresh fruits and vegetables. Limit foods  that are high in fat and processed sugars, such as fried or sweet foods. Keep all follow-up visits. This is important. Contact a health care provider if: You notice a new bulge in your abdomen. Get help right away if: You experience severe discomfort in your abdomen. You develop severe abdominal pain along with nausea, vomiting, or a fever. Summary Diastasis recti is a condition in which the muscles of the abdomen (rectus abdominismuscles) become thin and separate. You may notice a bulge in your abdomen because the space has widened between the muscles of the right and left abdomen. The most common symptom is a bulge in the middle of your abdomen. You will notice it most when you sit up or strain. This condition is diagnosed with a physical exam. If the muscle separation is not too big, you may not need treatment. Otherwise, you may need to do physical therapy or have surgery. This information is not intended to replace advice given to you by your health care provider. Make sure you discuss any questions you have with your health care provider. Document Revised: 04/15/2020 Document Reviewed: 04/15/2020 Elsevier Patient Education  2023 Elsevier Inc.   Please schedule a Follow-up Appointment to: Return if symptoms worsen or fail to improve.  If you have any other questions or concerns, please feel free to call the office or send a message through MyChart. You may also schedule an earlier appointment if necessary.  Additionally, you may be receiving a survey about your experience at our office within a few days to 1 week by e-mail or mail. We value your feedback.  Saralyn Pilar, DO [facility], [place]

## 2022-04-04 NOTE — Progress Notes (Signed)
Subjective:    Patient ID: Morgan Stevens, female    DOB: 1966-03-11, 56 y.o.   MRN: [id number]  Morgan Stevens is a 56 y.o. female presenting on 04/04/2022 for Diastasis Recti   HPI  Diastasis Recti Patient with past history of prior pregnancy x 8, she admits has had notable increase size in the abdominal wall that is "pushing out" like a hernia. Says worse after larger meal or straining or coughing can push out and cause some pressure and discomfort. She has not had any abdominal surgery or incision in this area. Prior cholecystectomy but that was laparoscopic.  History of COVID19 Had coughing fever chills palpitations HR tachycardia, went to hospital ED 03/19/22, confirmed on COVID positive test, dehydration testing done, treated with anti viral. Has improved overall now nearly resolved.      11/22/2021    2:49 PM 11/09/2021    1:58 PM 10/03/2021    2:55 PM  Depression screen PHQ 2/9  Decreased Interest 1 0 0  Down, Depressed, Hopeless 1 0 0  PHQ - 2 Score 2 0 0  Altered sleeping 0  1  Tired, decreased energy 1  2  Change in appetite 0  0  Feeling bad or failure about yourself  1  0  Trouble concentrating 1  0  Moving slowly or fidgety/restless 0  0  Suicidal thoughts 0  0  PHQ-9 Score 5  3  Difficult doing work/chores Somewhat difficult  Not difficult at all    Social History   Tobacco Use   Smoking status: Never   Smokeless tobacco: Never  Vaping Use   Vaping Use: Never used  Substance Use Topics   Alcohol use: No    Alcohol/week: 0.0 standard drinks of alcohol   Drug use: No    Review of Systems Per HPI unless specifically indicated above     Objective:    BP 138/86 (BP Location: Right Arm, Patient Position: Sitting, Cuff Size: Large)   Pulse 81   Temp 98.5 F (36.9 C) (Oral)   Wt 238 lb (108 kg)   LMP 07/11/2020   SpO2 99%   BMI 40.85 kg/m   Wt Readings from Last 3 Encounters:  04/04/22 238 lb (108 kg)  03/19/22 241 lb (109.3 kg)  02/06/22 241 lb  (109.3 kg)    Physical Exam Vitals and nursing note reviewed.  Constitutional:      General: She is not in acute distress.    Appearance: Normal appearance. She is well-developed. She is not diaphoretic.     Comments: Well-appearing, comfortable, cooperative  HENT:     Head: Normocephalic and atraumatic.  Eyes:     General:        Right eye: No discharge.        Left eye: No discharge.     Conjunctiva/sclera: Conjunctivae normal.  Cardiovascular:     Rate and Rhythm: Normal rate.  Pulmonary:     Effort: Pulmonary effort is normal.  Abdominal:     General: Abdomen is flat. Bowel sounds are normal. There is no distension.     Palpations: Abdomen is soft.     Tenderness: There is no abdominal tenderness. There is no guarding.     Comments: Large Diastsis recti centrally above umbilicus midline not palpable supine and with inc intraabdominal pressure sitting up classic provoked diastasis recti without focal herniation or other symptoms.  Skin:    General: Skin is warm and dry.     Findings: No erythema  or rash.  Neurological:     Mental Status: She is alert and oriented to person, place, and time.  Psychiatric:        Mood and Affect: Mood normal.        Behavior: Behavior normal.        Thought Content: Thought content normal.     Comments: Well groomed, good eye contact, normal speech and thoughts       Results for orders placed or performed during the hospital encounter of 03/19/22  Resp Panel by RT-PCR (Flu A&B, Covid) Anterior Nasal Swab   Specimen: Anterior Nasal Swab  Result Value Ref Range   SARS Coronavirus 2 by RT PCR POSITIVE (A) NEGATIVE   Influenza A by PCR NEGATIVE NEGATIVE   Influenza B by PCR NEGATIVE NEGATIVE  Basic metabolic panel  Result Value Ref Range   Sodium 138 135 - 145 mmol/L   Potassium 3.7 3.5 - 5.1 mmol/L   Chloride 103 98 - 111 mmol/L   CO2 25 22 - 32 mmol/L   Glucose, Bld 125 (H) 70 - 99 mg/dL   BUN 9 6 - 20 mg/dL   Creatinine, Ser 2.42  0.44 - 1.00 mg/dL   Calcium 9.2 8.9 - 35.3 mg/dL   GFR, Estimated >61 >44 mL/min   Anion gap 10 5 - 15  CBC with Differential  Result Value Ref Range   WBC 7.8 4.0 - 10.5 K/uL   RBC 4.51 3.87 - 5.11 MIL/uL   Hemoglobin 13.7 12.0 - 15.0 g/dL   HCT 31.5 40.0 - 86.7 %   MCV 88.9 80.0 - 100.0 fL   MCH 30.4 26.0 - 34.0 pg   MCHC 34.2 30.0 - 36.0 g/dL   RDW 61.9 50.9 - 32.6 %   Platelets 255 150 - 400 K/uL   nRBC 0.0 0.0 - 0.2 %   Neutrophils Relative % 77 %   Neutro Abs 6.0 1.7 - 7.7 K/uL   Lymphocytes Relative 13 %   Lymphs Abs 1.0 0.7 - 4.0 K/uL   Monocytes Relative 8 %   Monocytes Absolute 0.6 0.1 - 1.0 K/uL   Eosinophils Relative 1 %   Eosinophils Absolute 0.1 0.0 - 0.5 K/uL   Basophils Relative 1 %   Basophils Absolute 0.1 0.0 - 0.1 K/uL   Immature Granulocytes 0 %   Abs Immature Granulocytes 0.02 0.00 - 0.07 K/uL  Lactic acid, plasma  Result Value Ref Range   Lactic Acid, Venous 0.5 0.5 - 1.9 mmol/L  D-dimer, quantitative  Result Value Ref Range   D-Dimer, Quant <0.27 0.00 - 0.50 ug/mL-FEU  Troponin I (High Sensitivity)  Result Value Ref Range   Troponin I (High Sensitivity) 4 <18 ng/L      Assessment & Plan:   Problem List Items Addressed This Visit   None Visit Diagnoses     Diastasis recti    -  Primary       Diastasis Recti Clinically appears classic on exam, history suggestive with pregnancy x 8 in past No complication evidenced at this time. No other ventral hernia Has episodic symptoms worse with inc intraabdominal pressure,  large meals, coughing inc pressure on it, otherwise not bothering her as much. Reassurance, reviewed diagnosis, discussed management, can try abdominal binder and avoid excessive intra-abdominal pressure Recommend future referral to gen surgery if worse or new concerns or more symptomatic.  History of COVID19 = resolved  No orders of the defined types were placed in this encounter.  Follow up plan: Return if symptoms  worsen or fail to improve.   Saralyn Pilar, DO [facility] 04/04/2022, 3:03 PM

## 2022-04-10 ENCOUNTER — Telehealth: Payer: Medicaid Other | Admitting: Physician Assistant

## 2022-04-10 DIAGNOSIS — R059 Cough, unspecified: Secondary | ICD-10-CM

## 2022-04-10 NOTE — Progress Notes (Signed)
Because of your persistent cough for almost a month, I feel your condition warrants further evaluation and I recommend that you be seen in a face to face visit. I think that you need to have a lung exam and you will likely need a chest xray.   NOTE: There will be NO CHARGE for this eVisit   If you are having a true medical emergency please call 911.      For an urgent face to face visit, [place] has seven urgent care centers for your convenience:     [facility] Center at [facility] Directions [phone] [address] [place], [place] [address]    [facility] Health Urgent Care Center [facility]) Get Driving Directions [phone] [address] [place], [place] [address]  [facility] Health Urgent Care Center [facility]) Get Driving Directions [phone] [address] [place],  [place]  [address]  [facility] Health Urgent Care Center [facility] [employer] Directions  [phone] [place],  [place] [address]   [facility] Health Urgent Care at [facility] Get Driving Directions [phone] [address] [place], [place] [address]   [facility] Health Urgent Care at [facility] Get Driving Directions  [phone] [address] [place], [place] [address]   [facility] Health Urgent Care at [facility] Directions [phone] [address], [address] [place], [place] [address]  Your MyChart E-visit questionnaire answers were reviewed by a board certified advanced clinical practitioner to complete your personal care plan based on your specific symptoms.  Thank you for using e-Visits.   Approximately 5 minutes was spent documenting and reviewing patient's chart.

## 2022-04-11 ENCOUNTER — Ambulatory Visit: Payer: Medicaid Other

## 2022-04-11 DIAGNOSIS — M67814 Other specified disorders of tendon, left shoulder: Secondary | ICD-10-CM | POA: Diagnosis not present

## 2022-04-16 DIAGNOSIS — M67814 Other specified disorders of tendon, left shoulder: Secondary | ICD-10-CM | POA: Diagnosis not present

## 2022-04-23 ENCOUNTER — Encounter: Payer: Self-pay | Admitting: Family Medicine

## 2022-04-24 ENCOUNTER — Telehealth: Payer: Self-pay | Admitting: Family Medicine

## 2022-04-24 NOTE — Telephone Encounter (Signed)
..   Medicaid Managed Care   Unsuccessful Outreach Note  04/24/2022 Name: Morgan Stevens MRN: [id number] DOB: 25-Jul-1966  Referred by: [name], DO Reason for referral : High Risk Managed Medicaid (I called the patient today to get her scheduled with the MM Team. I left my name and number on her VM.)   A second unsuccessful telephone outreach was attempted today. The patient was referred to the case management team for assistance with care management and care coordination.   Follow Up Plan: The care management team will reach out to the patient again over the next 14 days.   Weston Settle Care Guide, High Risk Medicaid Managed Care Embedded Care Coordination [facility]  [facility]    SIGNATURE

## 2022-05-02 ENCOUNTER — Telehealth (INDEPENDENT_AMBULATORY_CARE_PROVIDER_SITE_OTHER): Payer: Self-pay | Admitting: Family Medicine

## 2022-05-02 ENCOUNTER — Encounter: Payer: Self-pay | Admitting: Family Medicine

## 2022-05-02 VITALS — Ht 64.0 in | Wt 238.0 lb

## 2022-05-02 DIAGNOSIS — K219 Gastro-esophageal reflux disease without esophagitis: Secondary | ICD-10-CM

## 2022-05-02 DIAGNOSIS — M67814 Other specified disorders of tendon, left shoulder: Secondary | ICD-10-CM | POA: Diagnosis not present

## 2022-05-02 NOTE — Progress Notes (Signed)
Patient scheduled for 1120 am virtual visit, I was not free until 1135-1140 am approximately, unable to reach patient, by phone or video. She messaged [place] and said she had to get ready to leave unfortunately could not take call. I re-attempted around 1150 am. And again 515pm after hours and could not reach her. We will correspond on mychart message to re-visit this.  Saralyn Pilar, DO [facility] 05/02/2022, 5:22 PM

## 2022-05-09 DIAGNOSIS — M19011 Primary osteoarthritis, right shoulder: Secondary | ICD-10-CM | POA: Diagnosis not present

## 2022-05-09 DIAGNOSIS — M67922 Unspecified disorder of synovium and tendon, left upper arm: Secondary | ICD-10-CM | POA: Diagnosis not present

## 2022-05-09 DIAGNOSIS — M7521 Bicipital tendinitis, right shoulder: Secondary | ICD-10-CM | POA: Diagnosis not present

## 2022-05-09 DIAGNOSIS — M67814 Other specified disorders of tendon, left shoulder: Secondary | ICD-10-CM | POA: Diagnosis not present

## 2022-05-16 ENCOUNTER — Other Ambulatory Visit: Payer: Self-pay | Admitting: Internal Medicine

## 2022-05-17 ENCOUNTER — Other Ambulatory Visit: Payer: Self-pay

## 2022-05-17 ENCOUNTER — Encounter: Payer: Self-pay | Admitting: Family Medicine

## 2022-05-17 DIAGNOSIS — E7800 Pure hypercholesterolemia, unspecified: Secondary | ICD-10-CM

## 2022-05-17 MED ORDER — ATORVASTATIN CALCIUM 20 MG PO TABS: 20.0000 mg | ORAL_TABLET | Freq: Every day | ORAL | 1 refills | Status: DC

## 2022-05-24 ENCOUNTER — Ambulatory Visit: Payer: Medicaid Other | Admitting: Family Medicine

## 2022-05-30 ENCOUNTER — Other Ambulatory Visit: Payer: Self-pay | Admitting: Family Medicine

## 2022-05-30 ENCOUNTER — Encounter: Payer: Self-pay | Admitting: Family Medicine

## 2022-05-30 ENCOUNTER — Ambulatory Visit: Payer: Medicaid Other | Admitting: Family Medicine

## 2022-05-30 VITALS — BP 141/80 | HR 85 | Ht 64.0 in | Wt 235.6 lb

## 2022-05-30 DIAGNOSIS — E7800 Pure hypercholesterolemia, unspecified: Secondary | ICD-10-CM

## 2022-05-30 DIAGNOSIS — Z0000 Encounter for general adult medical examination without abnormal findings: Secondary | ICD-10-CM

## 2022-05-30 DIAGNOSIS — E890 Postprocedural hypothyroidism: Secondary | ICD-10-CM

## 2022-05-30 DIAGNOSIS — K219 Gastro-esophageal reflux disease without esophagitis: Secondary | ICD-10-CM

## 2022-05-30 DIAGNOSIS — Z8632 Personal history of gestational diabetes: Secondary | ICD-10-CM

## 2022-05-30 DIAGNOSIS — K2289 Other specified disease of esophagus: Secondary | ICD-10-CM | POA: Diagnosis not present

## 2022-05-30 DIAGNOSIS — K200 Eosinophilic esophagitis: Secondary | ICD-10-CM

## 2022-05-30 DIAGNOSIS — R7309 Other abnormal glucose: Secondary | ICD-10-CM

## 2022-05-30 MED ORDER — PANTOPRAZOLE SODIUM 40 MG PO TBEC: 40.0000 mg | DELAYED_RELEASE_TABLET | Freq: Every day | ORAL | 3 refills | Status: DC

## 2022-05-30 NOTE — Progress Notes (Signed)
Subjective:    Patient ID: Morgan Stevens, female    DOB: September 23, 1965, 56 y.o.   MRN: [id number]  Morgan Stevens is a 56 y.o. female presenting on 05/30/2022 for Gastroesophageal Reflux   HPI  GERD Past history with similar condition, she was on PPI in the past Pantoprazole 2017, 40mg  with improvement, later GI took her off med and switched to Omeprazole 20mg  and Pepcid but then again phased off Omeprazole. She has now been only on Famotidine 20mg  BID  Previously with [facility] - 03/2019, had EGD with moderate sized hiatal hernia      11/22/2021    2:49 PM 11/09/2021    1:58 PM 10/03/2021    2:55 PM  Depression screen PHQ 2/9  Decreased Interest 1 0 0  Down, Depressed, Hopeless 1 0 0  PHQ - 2 Score 2 0 0  Altered sleeping 0  1  Tired, decreased energy 1  2  Change in appetite 0  0  Feeling bad or failure about yourself  1  0  Trouble concentrating 1  0  Moving slowly or fidgety/restless 0  0  Suicidal thoughts 0  0  PHQ-9 Score 5  3  Difficult doing work/chores Somewhat difficult  Not difficult at all    Social History   Tobacco Use   Smoking status: Never   Smokeless tobacco: Never  Vaping Use   Vaping Use: Never used  Substance Use Topics   Alcohol use: No    Alcohol/week: 0.0 standard drinks of alcohol   Drug use: No    Review of Systems Per HPI unless specifically indicated above     Objective:    BP (!) 141/80   Pulse 85   Ht 5\' 4"  (1.626 m)   Wt 235 lb 9.6 oz (106.9 kg)   LMP 07/11/2020   SpO2 99%   BMI 40.44 kg/m   Wt Readings from Last 3 Encounters:  05/30/22 235 lb 9.6 oz (106.9 kg)  05/02/22 238 lb (108 kg)  04/04/22 238 lb (108 kg)    Physical Exam Vitals and nursing note reviewed.  Constitutional:      General: She is not in acute distress.    Appearance: Normal appearance. She is well-developed. She is obese. She is not diaphoretic.     Comments: Well-appearing, comfortable, cooperative  HENT:     Head: Normocephalic and atraumatic.   Eyes:     General:        Right eye: No discharge.        Left eye: No discharge.     Conjunctiva/sclera: Conjunctivae normal.  Cardiovascular:     Rate and Rhythm: Normal rate.  Pulmonary:     Effort: Pulmonary effort is normal.  Skin:    General: Skin is warm and dry.     Findings: No erythema or rash.  Neurological:     Mental Status: She is alert and oriented to person, place, and time.  Psychiatric:        Mood and Affect: Mood normal.        Behavior: Behavior normal.        Thought Content: Thought content normal.     Comments: Well groomed, good eye contact, normal speech and thoughts    Results for orders placed or performed during the hospital encounter of 03/19/22  Resp Panel by RT-PCR (Flu A&B, Covid) Anterior Nasal Swab   Specimen: Anterior Nasal Swab  Result Value Ref Range   SARS Coronavirus 2 by  RT PCR POSITIVE (A) NEGATIVE   Influenza A by PCR NEGATIVE NEGATIVE   Influenza B by PCR NEGATIVE NEGATIVE  Basic metabolic panel  Result Value Ref Range   Sodium 138 135 - 145 mmol/L   Potassium 3.7 3.5 - 5.1 mmol/L   Chloride 103 98 - 111 mmol/L   CO2 25 22 - 32 mmol/L   Glucose, Bld 125 (H) 70 - 99 mg/dL   BUN 9 6 - 20 mg/dL   Creatinine, Ser 0.87 0.44 - 1.00 mg/dL   Calcium 9.2 8.9 - 10.3 mg/dL   GFR, Estimated >60 >60 mL/min   Anion gap 10 5 - 15  CBC with Differential  Result Value Ref Range   WBC 7.8 4.0 - 10.5 K/uL   RBC 4.51 3.87 - 5.11 MIL/uL   Hemoglobin 13.7 12.0 - 15.0 g/dL   HCT 40.1 36.0 - 46.0 %   MCV 88.9 80.0 - 100.0 fL   MCH 30.4 26.0 - 34.0 pg   MCHC 34.2 30.0 - 36.0 g/dL   RDW 13.0 11.5 - 15.5 %   Platelets 255 150 - 400 K/uL   nRBC 0.0 0.0 - 0.2 %   Neutrophils Relative % 77 %   Neutro Abs 6.0 1.7 - 7.7 K/uL   Lymphocytes Relative 13 %   Lymphs Abs 1.0 0.7 - 4.0 K/uL   Monocytes Relative 8 %   Monocytes Absolute 0.6 0.1 - 1.0 K/uL   Eosinophils Relative 1 %   Eosinophils Absolute 0.1 0.0 - 0.5 K/uL   Basophils Relative 1 %    Basophils Absolute 0.1 0.0 - 0.1 K/uL   Immature Granulocytes 0 %   Abs Immature Granulocytes 0.02 0.00 - 0.07 K/uL  Lactic acid, plasma  Result Value Ref Range   Lactic Acid, Venous 0.5 0.5 - 1.9 mmol/L  D-dimer, quantitative  Result Value Ref Range   D-Dimer, Quant <0.27 0.00 - 0.50 ug/mL-FEU  Troponin I (High Sensitivity)  Result Value Ref Range   Troponin I (High Sensitivity) 4 <18 ng/L      Assessment & Plan:   Problem List Items Addressed This Visit     Columnar epithelial-lined lower esophagus   Relevant Medications   pantoprazole (PROTONIX) 40 MG tablet   GERD (gastroesophageal reflux disease)   Relevant Medications   pantoprazole (PROTONIX) 40 MG tablet   Other Visit Diagnoses     Eosinophilic esophagitis    -  Primary   Relevant Medications   pantoprazole (PROTONIX) 40 MG tablet      EOE GERD Previously followed by GI prior EGD 2020 Hiatal hernia identified and esophagitis Previously improved on PPI now worsening off therapy DC h2 blocker ineffective Restart Pantoprazole 40mg  daily rx sent   Meds ordered this encounter  Medications   pantoprazole (PROTONIX) 40 MG tablet    Sig: Take 1 tablet (40 mg total) by mouth daily before breakfast.    Dispense:  90 tablet    Refill:  3     Follow up plan: Return in about 6 months (around 11/29/2022) for 6 month fasting lab only then 1 week later Annual Physical.  Future labs ordered for 11/26/22   Nobie Putnam, [facility] 05/30/2022, 3:07 PM

## 2022-05-30 NOTE — Patient Instructions (Addendum)
Thank you for coming to the office today.  Stop the Famotidine Start the Pantoprazole 40mg  daily before breakfast, keep at this dose for a while. In future we could consider dial back to 20mg  May reconsider return to GI for further testing if indicated.  DUE for FASTING BLOOD WORK (no food or drink after midnight before the lab appointment, only water or coffee without cream/sugar on the morning of)  SCHEDULE "Lab Only" visit in the morning at the clinic for lab draw in 6 MONTHS   - Make sure Lab Only appointment is at about 1 week before your next appointment, so that results will be available  For Lab Results, once available within 2-3 days of blood draw, you can can log in to MyChart online to view your results and a brief explanation. Also, we can discuss results at next follow-up visit.   Please schedule a Follow-up Appointment to: Return in about 6 months (around 11/29/2022) for 6 month fasting lab only then 1 week later Annual Physical.  If you have any other questions or concerns, please feel free to call the office or send a message through [place]. You may also schedule an earlier appointment if necessary.  Additionally, you may be receiving a survey about your experience at our office within a few days to 1 week by e-mail or mail. We value your feedback.  Nobie Putnam, DO [place]

## 2022-06-20 DIAGNOSIS — M7522 Bicipital tendinitis, left shoulder: Secondary | ICD-10-CM | POA: Diagnosis not present

## 2022-06-29 ENCOUNTER — Emergency Department: Payer: Medicaid Other

## 2022-06-29 ENCOUNTER — Other Ambulatory Visit: Payer: Self-pay

## 2022-06-29 DIAGNOSIS — R079 Chest pain, unspecified: Secondary | ICD-10-CM | POA: Diagnosis not present

## 2022-06-29 DIAGNOSIS — Z79899 Other long term (current) drug therapy: Secondary | ICD-10-CM | POA: Insufficient documentation

## 2022-06-29 DIAGNOSIS — E876 Hypokalemia: Secondary | ICD-10-CM | POA: Insufficient documentation

## 2022-06-29 DIAGNOSIS — R0789 Other chest pain: Secondary | ICD-10-CM | POA: Diagnosis not present

## 2022-06-29 DIAGNOSIS — I10 Essential (primary) hypertension: Secondary | ICD-10-CM | POA: Insufficient documentation

## 2022-06-29 LAB — CBC WITH DIFFERENTIAL/PLATELET
Abs Immature Granulocytes: 0.03 10*3/uL (ref 0.00–0.07)
Basophils Absolute: 0.1 10*3/uL (ref 0.0–0.1)
Basophils Relative: 1 %
Eosinophils Absolute: 0.2 10*3/uL (ref 0.0–0.5)
Eosinophils Relative: 2 %
HCT: 37 % (ref 36.0–46.0)
Hemoglobin: 13 g/dL (ref 12.0–15.0)
Immature Granulocytes: 0 %
Lymphocytes Relative: 39 %
Lymphs Abs: 3.3 10*3/uL (ref 0.7–4.0)
MCH: 30.6 pg (ref 26.0–34.0)
MCHC: 35.1 g/dL (ref 30.0–36.0)
MCV: 87.1 fL (ref 80.0–100.0)
Monocytes Absolute: 0.6 10*3/uL (ref 0.1–1.0)
Monocytes Relative: 8 %
Neutro Abs: 4.2 10*3/uL (ref 1.7–7.7)
Neutrophils Relative %: 50 %
Platelets: 301 10*3/uL (ref 150–400)
RBC: 4.25 MIL/uL (ref 3.87–5.11)
RDW: 13.6 % (ref 11.5–15.5)
WBC: 8.4 10*3/uL (ref 4.0–10.5)
nRBC: 0 % (ref 0.0–0.2)

## 2022-06-29 LAB — BASIC METABOLIC PANEL
Anion gap: 8 (ref 5–15)
BUN: 6 mg/dL (ref 6–20)
CO2: 25 mmol/L (ref 22–32)
Calcium: 8.7 mg/dL — ABNORMAL LOW (ref 8.9–10.3)
Chloride: 106 mmol/L (ref 98–111)
Creatinine, Ser: 0.74 mg/dL (ref 0.44–1.00)
GFR, Estimated: >60 mL/min (ref >60)
Glucose, Bld: 123 mg/dL — ABNORMAL HIGH (ref 70–99)
Potassium: 3.4 mmol/L — ABNORMAL LOW (ref 3.5–5.1)
Sodium: 139 mmol/L (ref 135–145)

## 2022-06-29 LAB — TROPONIN I (HIGH SENSITIVITY): Troponin I (High Sensitivity): 3 ng/L (ref <18)

## 2022-06-29 NOTE — ED Triage Notes (Signed)
Pt c/o chest pain to the L chest, sts it started approx 1.5 hrs ago while she was watching TV.

## 2022-06-30 ENCOUNTER — Emergency Department
Admission: EM | Admit: 2022-06-30 | Discharge: 2022-06-30 | Disposition: A | Discharge status: Home / Self Care | Payer: Medicaid Other | Attending: Emergency Medicine | Admitting: Emergency Medicine

## 2022-06-30 DIAGNOSIS — E876 Hypokalemia: Secondary | ICD-10-CM

## 2022-06-30 DIAGNOSIS — R079 Chest pain, unspecified: Secondary | ICD-10-CM

## 2022-06-30 LAB — HEPATIC FUNCTION PANEL
ALT: 34 U/L (ref 0–44)
AST: 21 U/L (ref 15–41)
Albumin: 3.7 g/dL (ref 3.5–5.0)
Alkaline Phosphatase: 50 U/L (ref 38–126)
Bilirubin, Direct: 0.1 mg/dL (ref 0.0–0.2)
Indirect Bilirubin: 0.6 mg/dL (ref 0.3–0.9)
Total Bilirubin: 0.7 mg/dL (ref 0.3–1.2)
Total Protein: 6.9 g/dL (ref 6.5–8.1)

## 2022-06-30 LAB — TROPONIN I (HIGH SENSITIVITY): Troponin I (High Sensitivity): 3 ng/L (ref <18)

## 2022-06-30 LAB — LIPASE, BLOOD: Lipase: 45 U/L (ref 11–51)

## 2022-06-30 LAB — D-DIMER, QUANTITATIVE: D-Dimer, Quant: <0.27 ug/mL-FEU (ref 0.00–0.50)

## 2022-06-30 MED ORDER — KETOROLAC TROMETHAMINE 30 MG/ML IJ SOLN
15.0000 mg | Freq: Once | INTRAMUSCULAR | Status: AC
  Administered 2022-06-30: 15 mg via INTRAVENOUS
  Filled 2022-06-30: qty 1

## 2022-06-30 MED ORDER — POTASSIUM CHLORIDE CRYS ER 20 MEQ PO TBCR
20.0000 meq | EXTENDED_RELEASE_TABLET | Freq: Once | ORAL | Status: AC
  Administered 2022-06-30: 20 meq via ORAL
  Filled 2022-06-30: qty 1

## 2022-06-30 NOTE — Discharge Instructions (Signed)
Return to the ER for recurrent or worsening symptoms, persistent vomiting, difficulty breathing or other concerns. 

## 2022-06-30 NOTE — ED Provider Notes (Signed)
[facility] Provider Note    Event Date/Time   First MD Initiated Contact with Patient 06/30/22 0202     (approximate)   History   Chest Pain   HPI  Morgan Stevens is a 56 y.o. female who presents to the ED from home with a chief complaint of chest pain.  Patient reports sharp left-sided chest pain, nonradiating which began approximately 90 minutes prior to arrival while she was watching TV.  Pain is not exacerbated by movement or deep breathing.  Symptoms not associated with diaphoresis, palpitations, nausea/vomiting or dizziness.  Denies recent fever, cough, abdominal pain.  Denies recent travel, trauma or hormone use.     Past Medical History   Past Medical History:  Diagnosis Date   Chronic headaches    several per week   Gastritis    GERD (gastroesophageal reflux disease)    Hiatal hernia    Hyperlipidemia    Hypertension    Obesity (BMI 30-39.9)    Reflux    Spinal stenosis    Thyroid disease    Vertigo    rare   Wears contact lenses      Active Problem List   Patient Active Problem List   Diagnosis Date Noted   Allergic rhinitis due to allergen 05/16/2021   Stricture and stenosis of esophagus    Columnar epithelial-lined lower esophagus    Gastric polyp    Esophageal dysphagia    Diverticulosis of large intestine without diverticulitis    Conversion disorder 06/11/2018   Esophagitis, [place] grade A 09/06/2017   Hiatal hernia 09/06/2017   Schatzki's ring of distal esophagus 09/06/2017   Morbid obesity (HCC)    Postoperative hypothyroidism 02/25/2016   GERD (gastroesophageal reflux disease) 01/21/2014   History of gestational diabetes 01/21/2014   Tonsillar hypertrophy 01/21/2014   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     Past Surgical History   Past Surgical History:  Procedure Laterality Date   CARDIAC CATHETERIZATION  02/27/2016   Procedure: Left Heart Cath and Coronary Angiography;  Surgeon: Corky Crafts, MD;  Location: [facility] INVASIVE CV LAB;  Service: Cardiovascular;;   CESAREAN SECTION     CHOLECYSTECTOMY     COLONOSCOPY WITH PROPOFOL N/A 04/15/2019   Procedure: COLONOSCOPY WITH PROPOFOL;  Surgeon: Pasty Spillers, MD;  Location: [facility] ENDOSCOPY;  Service: Endoscopy;  Laterality: N/A;   ESOPHAGOGASTRODUODENOSCOPY (EGD) WITH PROPOFOL N/A 04/15/2019   Procedure: ESOPHAGOGASTRODUODENOSCOPY (EGD) WITH PROPOFOL;  Surgeon: Pasty Spillers, MD;  Location: [facility] ENDOSCOPY;  Service: Endoscopy;  Laterality: N/A;   SHOULDER ARTHROSCOPY WITH DEBRIDEMENT AND BICEP TENDON REPAIR Left 02/06/2022   Procedure: Left shoulder arthroscopic debridement, subacromial decompression, open subpectoral biceps tenodesis, and  Regeneten Patch application;  Surgeon: Signa Kell, MD;  Location: [facility];  Service: Orthopedics;  Laterality: Left;   THYROIDECTOMY     THYROIDECTOMY       Home Medications   Prior to Admission medications   Medication Sig Start Date End Date Taking? Authorizing Provider  acetaminophen (TYLENOL) 500 MG tablet Take 2 tablets (1,000 mg total) by mouth every 8 (eight) hours. 02/06/22 02/06/23  Signa Kell, MD  albuterol (VENTOLIN HFA) 108 (90 Base) MCG/ACT inhaler Inhale 2 puffs into the lungs every 4 (four) hours as needed for wheezing or shortness of breath. 09/11/21   Karamalegos, Netta Neat, DO  amLODipine (NORVASC) 2.5 MG tablet Take 1 tablet (2.5 mg total) by mouth daily. 08/24/21   Karamalegos, Netta Neat, DO  atorvastatin (LIPITOR) 20  MG tablet Take 1 tablet (20 mg total) by mouth at bedtime. 05/17/22 11/13/22  Karamalegos, Devonne Doughty, DO  baclofen 5 MG TABS Take 5-10 mg by mouth 3 (three) times daily as needed for muscle spasms. 07/24/21   Karamalegos, Devonne Doughty, DO  Calcium Carbonate-Simethicone (TUMS GAS RELIEF CHEWY BITES PO) Take by mouth.    [provider]  EUTHYROX 125 MCG tablet Take 1 tablet (125 mcg total) by mouth daily before breakfast.  07/03/21   Karamalegos, Devonne Doughty, DO  fluticasone (FLONASE) 50 MCG/ACT nasal spray Place 2 sprays into both nostrils daily. Use for 4-6 weeks then stop and use seasonally or as needed. 05/16/21   Karamalegos, Devonne Doughty, DO  gabapentin (NEURONTIN) 100 MG capsule Start 1 capsule daily at bedtime, increase by 1 cap every 2-3 days as tolerated up to 3 doses = 300mg  at bedtime Patient taking differently: Start 1 capsule daily at bedtime, increase by 1 cap every 2-3 days as tolerated up to 3 doses = 300mg  at bedtime (01/30/22 takes 1 AM, 2 PM) 09/11/21   Karamalegos, Devonne Doughty, DO  montelukast (SINGULAIR) 10 MG tablet Take 1 tablet (10 mg total) by mouth at bedtime. 09/11/21   Karamalegos, Devonne Doughty, DO  pantoprazole (PROTONIX) 40 MG tablet Take 1 tablet (40 mg total) by mouth daily before breakfast. 05/30/22   Olin Hauser, DO     Allergies  Patient has no known allergies.   Family History   Family History  Problem Relation Age of Onset   Congestive Heart Failure Mother    Breast cancer Neg Hx      Physical Exam  Triage Vital Signs: ED Triage Vitals  Enc Vitals Group     BP 06/29/22 2244 (!) 149/94     Pulse Rate 06/29/22 2244 82     Resp 06/29/22 2244 20     Temp 06/29/22 2244 98.5 F (36.9 C)     Temp Source 06/29/22 2244 Oral     SpO2 06/29/22 2244 97 %     Weight 06/29/22 2245 238 lb (108 kg)     Height 06/29/22 2245 5\' 4"  (1.626 m)     Head Circumference --      Peak Flow --      Pain Score 06/29/22 2256 1     Pain Loc --      Pain Edu? --      Excl. in [place]? --     Updated Vital Signs: BP (!) 140/80   Pulse 70   Temp 98.6 F (37 C) (Oral)   Resp 16   Ht 5\' 4"  (1.626 m)   Wt 108 kg   LMP 07/11/2020   SpO2 98%   BMI 40.85 kg/m    General: Awake, no distress.  CV:  RRR.  Good peripheral perfusion.  Resp:  Normal effort.  CTA B. Abd:  Nontender.  No distention.  Other:  Bilateral calves are nonswollen and nontender.   ED Results / Procedures /  Treatments  Labs (all labs ordered are listed, but only abnormal results are displayed) Labs Reviewed  BASIC METABOLIC PANEL - Abnormal; Notable for the following components:      Result Value   Potassium 3.4 (*)    Glucose, Bld 123 (*)    Calcium 8.7 (*)    All other components within normal limits  CBC WITH DIFFERENTIAL/PLATELET  HEPATIC FUNCTION PANEL  LIPASE, BLOOD  D-DIMER, QUANTITATIVE  TROPONIN I (HIGH SENSITIVITY)  TROPONIN I (HIGH SENSITIVITY)  EKG  ED ECG REPORT I, Latara Micheli J, the attending physician, personally viewed and interpreted this ECG.   Date: 06/30/2022  EKG Time: 2248  Rate: 82  Rhythm: normal sinus rhythm  Axis: Normal  Intervals: PACs  ST&T Change: Nonspecific    RADIOLOGY I have independently visualized and interpreted patient's chest x-ray as well as noted the radiology interpretation:  X-ray: No acute cardiopulmonary process  Official radiology report(s): DG Chest 2 View  Result Date: 06/29/2022 CLINICAL DATA:  Left-sided chest pain. EXAM: CHEST - 2 VIEW COMPARISON:  03/19/2022 FINDINGS: The cardiomediastinal contours are normal. The lungs are clear. Pulmonary vasculature is normal. No consolidation, pleural effusion, or pneumothorax. No acute osseous abnormalities are seen. IMPRESSION: Negative radiographs of the chest. Electronically Signed   By: Narda Rutherford M.D.   On: 06/29/2022 23:21     PROCEDURES:  Critical Care performed: No  .1-3 Lead EKG Interpretation  Performed by: Irean Hong, MD Authorized by: Irean Hong, MD     Interpretation: normal     ECG rate:  80   ECG rate assessment: normal     Rhythm: sinus rhythm     Ectopy: none     Conduction: normal   Comments:     Patient placed on cardiac monitor to evaluate for arrhythmias    MEDICATIONS ORDERED IN ED: Medications  ketorolac (TORADOL) 30 MG/ML injection 15 mg (15 mg Intravenous Given 06/30/22 0251)  potassium chloride SA (KLOR-CON M) CR tablet 20 mEq  (20 mEq Oral Given 06/30/22 0249)     IMPRESSION / MDM / ASSESSMENT AND PLAN / ED COURSE  I reviewed the triage vital signs and the nursing notes.                             56 year old female presenting with sharp left-sided chest pain. Differential diagnosis includes, but is not limited to, ACS, aortic dissection, pulmonary embolism, cardiac tamponade, pneumothorax, pneumonia, pericarditis, myocarditis, GI-related causes including esophagitis/gastritis, and musculoskeletal chest wall pain.   I have personally reviewed patient's records and notes and orthopedic office visit on 06/20/2022 for left upper extremity biceps tendinitis.  Patient's presentation is most consistent with acute presentation with potential threat to life or bodily function.  The patient is on the cardiac monitor to evaluate for evidence of arrhythmia and/or significant heart rate changes.  Laboratory results demonstrate normal WBC, minimal hypokalemia potassium 3.4, hepatic panel/lipase unremarkable.  Presents for negative troponins.  Given pleuritic nature of patient's pain, will obtain D-dimer and proceed with CTA chest positive.  Administer oral potassium and IV ketorolac for pain.  Will reassess.  Of note, patient is 98% on room air; she is not partial rebreather mask per nursing documentation at 0124.  Clinical Course as of 06/30/22 0335  Sat Jun 30, 2022  0335 Repeat troponin and D-dimer are unremarkable.  Will refer patient to cardiology for outpatient follow-up.  Strict return precautions given.  Patient verbalizes understanding and agrees with plan of care. [JS]    Clinical Course User Index [JS] Irean Hong, MD     FINAL CLINICAL IMPRESSION(S) / ED DIAGNOSES   Final diagnoses:  Nonspecific chest pain  Hypokalemia     Rx / DC Orders   ED Discharge Orders     None        Note:  This document was prepared using Dragon voice recognition software and may include unintentional dictation errors.    Chiquita Loth  J, MD 06/30/22 [phone]

## 2022-07-02 ENCOUNTER — Ambulatory Visit: Payer: Medicaid Other | Admitting: Family Medicine

## 2022-07-09 ENCOUNTER — Telehealth: Payer: Self-pay | Admitting: Orthopedic Surgery

## 2022-07-09 NOTE — Telephone Encounter (Signed)
Pt lvm on 07/06/22 stating that she has an appointment scheduled for 07/23/22 and needs to change the time.  I called the patient and lvm advising that we do not have any other time that day, to please call and let us know if she'd like to keep her current appointment or reschedule.

## 2022-07-18 ENCOUNTER — Ambulatory Visit: Payer: Medicaid Other | Admitting: Cardiology

## 2022-07-18 ENCOUNTER — Ambulatory Visit: Payer: Medicaid Other | Admitting: Family Medicine

## 2022-07-23 ENCOUNTER — Ambulatory Visit: Payer: Medicaid Other | Admitting: Orthopedic Surgery

## 2022-07-30 ENCOUNTER — Encounter: Payer: Self-pay | Admitting: Orthopedic Surgery

## 2022-07-30 ENCOUNTER — Ambulatory Visit: Payer: Medicaid Other | Admitting: Orthopedic Surgery

## 2022-07-30 VITALS — BP 147/53 | HR 83 | Ht 64.0 in | Wt 241.6 lb

## 2022-07-30 DIAGNOSIS — Z9889 Other specified postprocedural states: Secondary | ICD-10-CM

## 2022-07-30 DIAGNOSIS — M75102 Unspecified rotator cuff tear or rupture of left shoulder, not specified as traumatic: Secondary | ICD-10-CM | POA: Diagnosis not present

## 2022-07-30 NOTE — Progress Notes (Signed)
New patient evaluation  Chief complaint pain and stiffness left shoulder  Chief Complaint  Patient presents with   New Patient (Initial Visit)   Shoulder Pain    LT shoulder/previous surgery/previous provider no longer in patients insurance network.     This is a 56 year old female had surgery in June 2023 by Dr. Allena Katz in [facility] for left shoulder rotator cuff tear.  She was in physical therapy progressing fairly well she says the surgery went good but when she failed to progress any more physical therapy [employer] insurance stopped paying for the treatments  She had a postop cortisone injection she says the surgeon considered more surgery for stiffness she comes in with 6-7 out of 10 pain with movement or attempting to elevate the arm but 0-10 out of pain at rest and she says the range of motion is not very good   therapy was at the [facility] clinic  BP (!) 147/53   Pulse 83   Ht 5\' 4"  (1.626 m)   Wt 241 lb 9.6 oz (109.6 kg)   LMP 07/11/2020   BMI 41.47 kg/m   Physical Exam Constitutional:      Appearance: Normal appearance.  Musculoskeletal:     Left shoulder: Tenderness present. No swelling, deformity, effusion, laceration, bony tenderness or crepitus. Decreased range of motion. Decreased strength. Normal pulse.     Comments: Left shoulder active range of motion abduction 85 and flexion 110  Passive range of motion painful at 90 degrees through 140 degrees where she becomes very tense and pain increases she has a normal drop arm test  She has normal external rotation with her arm at her side   Neurological:     Mental Status: She is alert.    Encounter Diagnoses  Name Primary?   Nontraumatic tear of left rotator cuff, unspecified tear extent Yes   History of repair of left rotator cuff, June 2023    Assessment and plan the patient probably should undergo more physical therapy but again [employer] has canceled that.  She will need a rotator cuff evaluation of the  repair with an intra-articular arthrogram followed by MRI.  If the tear has healed we can resume physical therapy if not then we can assess the need of further surgery  The patient's care everywhere notes Will be reviewed and will be incorporated by reference after I have a chance to read through the

## 2022-07-30 NOTE — Patient Instructions (Signed)
[phone] call and schedule mri arthrogram and we will be working on precert from [employer]

## 2022-07-31 ENCOUNTER — Telehealth: Payer: Self-pay | Admitting: Radiology

## 2022-07-31 ENCOUNTER — Other Ambulatory Visit: Payer: Self-pay | Admitting: Orthopedic Surgery

## 2022-07-31 DIAGNOSIS — G8929 Other chronic pain: Secondary | ICD-10-CM

## 2022-07-31 NOTE — Telephone Encounter (Signed)
Ok thanks, I must have missed it will submit to insurance.

## 2022-07-31 NOTE — Telephone Encounter (Signed)
Select all of the following evaluations that are documented to be positive. (Select all that apply) Empty can (Jobe's) or full can test (Neer's) Hornblower's test (Patte) Infraspinatus muscle strength test External rotation lag sign (drop sign) Lift off test Belly press or belly off test None of these apply  Select all of the following evaluations that are documented to be positive. (Select all that apply) Apley scratch test Apprehension Cross body Hawkins-Kennedy Resisted abduction Painful arc None of these apply Unknown  Are any of these tests positive, need to know for MRI approval, there is not a physical exam note in your notes yesterday but you normally do an exam  Can you make addendum and let me know what findings are for her shoulder exam  She has reached limit of her physical therapy visits is all I have found  Please advise.

## 2022-08-02 NOTE — Progress Notes (Signed)
Cardiology Clinic Note   Patient Name: Morgan Stevens Date of Encounter: 08/03/2022  Primary Care Provider:  [name], DO Primary Cardiologist:  Debbe Odea, MD  Patient Profile    56 year old female with a past medical history of gastroesophageal reflux disease, hiatal hernia, obesity, hypertension, hyperlipidemia, vertigo , atypical chest pain, hypothyroidism, who is here today for follow-up.  Past Medical History    Past Medical History:  Diagnosis Date   Chronic headaches    several per week   Gastritis    GERD (gastroesophageal reflux disease)    Hiatal hernia    Hyperlipidemia    Hypertension    Obesity (BMI 30-39.9)    Reflux    Spinal stenosis    Thyroid disease    Vertigo    rare   Wears contact lenses    Past Surgical History:  Procedure Laterality Date   CARDIAC CATHETERIZATION  02/27/2016   Procedure: Left Heart Cath and Coronary Angiography;  Surgeon: Corky Crafts, MD;  Location: [facility] INVASIVE CV LAB;  Service: Cardiovascular;;   CESAREAN SECTION     CHOLECYSTECTOMY     COLONOSCOPY WITH PROPOFOL N/A 04/15/2019   Procedure: COLONOSCOPY WITH PROPOFOL;  Surgeon: Pasty Spillers, MD;  Location: [facility] ENDOSCOPY;  Service: Endoscopy;  Laterality: N/A;   ESOPHAGOGASTRODUODENOSCOPY (EGD) WITH PROPOFOL N/A 04/15/2019   Procedure: ESOPHAGOGASTRODUODENOSCOPY (EGD) WITH PROPOFOL;  Surgeon: Pasty Spillers, MD;  Location: [facility] ENDOSCOPY;  Service: Endoscopy;  Laterality: N/A;   SHOULDER ARTHROSCOPY WITH DEBRIDEMENT AND BICEP TENDON REPAIR Left 02/06/2022   Procedure: Left shoulder arthroscopic debridement, subacromial decompression, open subpectoral biceps tenodesis, and  Regeneten Patch application;  Surgeon: Signa Kell, MD;  Location: [facility];  Service: Orthopedics;  Laterality: Left;   THYROIDECTOMY     THYROIDECTOMY      Allergies  No Known Allergies  History of Present Illness    Morgan Stevens 56 year old female  with previously mentioned past medical history of gastroesophageal reflux disease, gastritis, hiatal hernia, obesity, hypertension, hyperlipidemia, vertigo, atypical chest pain, and hypothyroidism.  Left heart catheterization was completed 04/2016 which showed the left ventricular systolic function was normal, no angiogram completed.  Coronary artery disease, and normal LVEDP recommendation was to continue with aggressive preventative care.  Coronary CTA was completed 12/24/2019 which revealed a coronary calcium score of 0, normal coronary origin with right dominance, no evidence of CAD, CAD-RADS 0. Echocardiogram completed 01/05/2020 revealed LVEF of 55 to 60%, no regional wall motion abnormalities, mild left ventricular hypertrophy, G1 DD, mild mitral regurgitation.  She was last seen in clinic by Dr. Azucena Cecil on 01/08/2020 with previous complaints of chest discomfort that was exertional in nature.  She had recently had an emergency department visit that was unrevealing.  They discussed test which both were nonrevealing and she was to follow-up on a as needed basis.  She has had several emergency department visits in 2023 for various symptoms of atypical chest pain, shortness of breath, weakness, or back pain.  Workups have all been unrevealing and she has been discharged home with recommendation for follow-up.  Her last emergency department visit was 06/30/2022 with a chief complaint of chest discomfort.  She had minimal hypokalemia with potassium of 3.4, negative troponins, and D-dimer was also unremarkable.  She was given potassium in Toradol for pain and was subsequently discharged home with outpatient follow-up with cardiology.  She returns to clinic today stating that overall she has been doing fairly well.  She did wind up  in the emergency department in November with complaints of left shoulder pain after having surgery done earlier in the year.  She was worked up for heart attack with workup being  unrevealing.  She has not had any further episodes of chest discomfort or left shoulder pain.  She does continue to suffer from dyspnea on exertion that she states has worsened over the last 6 months.  It can be walking fast or up inclines.  She was also noted to be hypokalemic in the emergency department while that was treated.  She states that she does have a longstanding history of having low to low normal potassium but has never been on supplementation in the past.  Home Medications    Current Outpatient Medications  Medication Sig Dispense Refill   acetaminophen (TYLENOL) 500 MG tablet Take 2 tablets (1,000 mg total) by mouth every 8 (eight) hours. 90 tablet 2   albuterol (VENTOLIN HFA) 108 (90 Base) MCG/ACT inhaler Inhale 2 puffs into the lungs every 4 (four) hours as needed for wheezing or shortness of breath. 6.7 g 2   amLODipine (NORVASC) 2.5 MG tablet Take 1 tablet (2.5 mg total) by mouth daily. 90 tablet 3   atorvastatin (LIPITOR) 20 MG tablet Take 1 tablet (20 mg total) by mouth at bedtime. 90 tablet 1   baclofen 5 MG TABS Take 5-10 mg by mouth 3 (three) times daily as needed for muscle spasms. 90 tablet 2   Calcium Carbonate-Simethicone (TUMS GAS RELIEF CHEWY BITES PO) Take by mouth.     EUTHYROX 125 MCG tablet Take 1 tablet (125 mcg total) by mouth daily before breakfast. 90 tablet 3   fluticasone (FLONASE) 50 MCG/ACT nasal spray Place 2 sprays into both nostrils daily. Use for 4-6 weeks then stop and use seasonally or as needed. 16 g 3   gabapentin (NEURONTIN) 100 MG capsule Start 1 capsule daily at bedtime, increase by 1 cap every 2-3 days as tolerated up to 3 doses = 300mg  at bedtime (Patient taking differently: Start 1 capsule daily at bedtime, increase by 1 cap every 2-3 days as tolerated up to 3 doses = 300mg  at bedtime (01/30/22 takes 1 AM, 2 PM)) 90 capsule 1   montelukast (SINGULAIR) 10 MG tablet Take 1 tablet (10 mg total) by mouth at bedtime. 90 tablet 3   pantoprazole  (PROTONIX) 40 MG tablet Take 1 tablet (40 mg total) by mouth daily before breakfast. 90 tablet 3   No current facility-administered medications for this visit.     Family History    Family History  Problem Relation Age of Onset   Congestive Heart Failure Mother    Breast cancer Neg Hx    She indicated that her mother is deceased. She indicated that her father is deceased. She indicated that all of her five sisters are alive. She indicated that her brother is deceased. She indicated that the status of her neg hx is unknown.  Social History    Social History   Socioeconomic History   Marital status: Married    Spouse name: Not on file   Number of children: Not on file   Years of education: Not on file   Highest education level: Not on file  Occupational History   Occupation: home maker  Tobacco Use   Smoking status: Never   Smokeless tobacco: Never  Vaping Use   Vaping Use: Never used  Substance and Sexual Activity   Alcohol use: No    Alcohol/week: 0.0 standard drinks  of alcohol   Drug use: No   Sexual activity: Not on file  Other Topics Concern   Not on file  Social History Narrative   Home maker, 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, husband is a window Midwifewasher   Social Determinants of Corporate investment bankerHealth   Financial Resource Strain: Not on file  Food Insecurity: Not on file  Transportation Needs: Not on file  Physical Activity: Not on file  Stress: Not on file  Social Connections: Not on file  Intimate Partner Violence: Not on file     Review of Systems    General:  No chills, fever, night sweats or endorses weight changes.  Cardiovascular:  No chest pain, endorses dyspnea on exertion, edema, orthopnea, endorses occasional palpitations, paroxysmal nocturnal dyspnea. Dermatological: No rash, lesions/masses Respiratory: No cough, endorses dyspnea Musculoskeletal: Endorses occasional left knee pain and left shoulder pain with surgery to left shoulder back in the summer Urologic: No  hematuria, dysuria Abdominal:   No nausea, vomiting, diarrhea, bright red blood per rectum, melena, or hematemesis Neurologic:  No visual changes, wkns, changes in mental status. All other systems reviewed and are otherwise negative except as noted above.   Physical Exam    VS:  BP (!) 140/88 (BP Location: Left Arm, Patient Position: Sitting, Cuff Size: Large)   Pulse 72   Ht 5\' 4"  (1.626 m)   Wt 243 lb (110.2 kg)   LMP 07/11/2020   SpO2 99%   BMI 41.71 kg/m  , BMI Body mass index is 41.71 kg/m.     GEN: Well nourished, well developed, in no acute distress. HEENT: normal.  Glasses on Neck: Supple, no JVD, carotid bruits, or masses. Cardiac: RRR, no murmurs, rubs, or gallops. No clubbing, cyanosis, edema.  Radials 2+/PT 2+ and equal bilaterally.  Respiratory:  Respirations regular and unlabored, clear to auscultation bilaterally. GI: Soft, nontender, nondistended, BS + x 4. MS: no deformity or atrophy. Skin: warm and dry, no rash. Neuro:  Strength and sensation are intact. Psych: Normal affect.  Accessory Clinical Findings    ECG personally reviewed by me today-sinus rhythm with a rate of 72 and LVH- No acute changes  Lab Results  Component Value Date   WBC 8.4 06/29/2022   HGB 13.0 06/29/2022   HCT 37.0 06/29/2022   MCV 87.1 06/29/2022   PLT 301 06/29/2022   Lab Results  Component Value Date   CREATININE 0.74 06/29/2022   BUN 6 06/29/2022   NA 139 06/29/2022   K 3.4 (L) 06/29/2022   CL 106 06/29/2022   CO2 25 06/29/2022   Lab Results  Component Value Date   ALT 34 06/29/2022   AST 21 06/29/2022   ALKPHOS 50 06/29/2022   BILITOT 0.7 06/29/2022   Lab Results  Component Value Date   CHOL 131 06/27/2021   HDL 48 (L) 06/27/2021   LDLCALC 67 06/27/2021   TRIG 82 06/27/2021   CHOLHDL 2.7 06/27/2021    Lab Results  Component Value Date   HGBA1C 5.1 06/27/2021    Assessment & Plan   1.  Dyspnea on exertion has been worsening since.  Patient originally  thought it was related to weight gain and decreased activity since having shoulder surgery.  It has worsened over the past 6 months.  Last echocardiogram was completed in 2021.  She has been scheduled for repeat echocardiogram for worsening DOE.  2.  Hypertension with blood pressure today 140/88.  Slightly elevated today as she is only currently on amlodipine 2.5 mg  that she takes in the evening.  There were no changes made to her medications today.  3.  Gastroesophageal reflux disease that she states occasionally causes some shortness of breath and palpitations.  She is continued on 40 mg of Protonix daily and states that she takes Tums and Pepto-Bismol throughout the day.  Will likely need continued follow-up with GI for worsening symptoms.  4.  Hypokalemia recent emergency department visit that was treated.  She states that she has had low to low normal potassium levels in the past so she has been sent for repeat BMP today to follow-up on the hypokalemia.  5.  Hyperlipidemia with/1/22.  She is continued on atorvastatin 20 mg daily.  Will need repeat next visit with PCP, as this has been followed by PCP.  6.  Hypothyroidism which she is continued on levothyroxine.  This continues to be monitored by PCP.  Last TSH 1.018.  7.  Disposition patient return to clinic to see/APP in 3 months or sooner if needed.  Kailynn Satterly, NP 08/03/2022, 9:13 AM

## 2022-08-03 ENCOUNTER — Encounter: Payer: Self-pay | Admitting: Cardiology

## 2022-08-03 ENCOUNTER — Ambulatory Visit: Payer: Medicaid Other | Attending: Cardiology | Admitting: Cardiology

## 2022-08-03 ENCOUNTER — Other Ambulatory Visit
Admission: RE | Admit: 2022-08-03 | Discharge: 2022-08-03 | Disposition: A | Discharge status: Home / Self Care | Payer: Medicaid Other | Source: Ambulatory Visit | Attending: Cardiology | Admitting: Cardiology

## 2022-08-03 VITALS — BP 140/88 | HR 72 | Ht 64.0 in | Wt 243.0 lb

## 2022-08-03 DIAGNOSIS — I10 Essential (primary) hypertension: Secondary | ICD-10-CM | POA: Diagnosis not present

## 2022-08-03 DIAGNOSIS — K219 Gastro-esophageal reflux disease without esophagitis: Secondary | ICD-10-CM

## 2022-08-03 DIAGNOSIS — E876 Hypokalemia: Secondary | ICD-10-CM | POA: Diagnosis not present

## 2022-08-03 DIAGNOSIS — E039 Hypothyroidism, unspecified: Secondary | ICD-10-CM

## 2022-08-03 DIAGNOSIS — E782 Mixed hyperlipidemia: Secondary | ICD-10-CM | POA: Diagnosis not present

## 2022-08-03 DIAGNOSIS — R0609 Other forms of dyspnea: Secondary | ICD-10-CM

## 2022-08-03 DIAGNOSIS — Z79899 Other long term (current) drug therapy: Secondary | ICD-10-CM | POA: Diagnosis not present

## 2022-08-03 DIAGNOSIS — K449 Diaphragmatic hernia without obstruction or gangrene: Secondary | ICD-10-CM | POA: Diagnosis not present

## 2022-08-03 LAB — BASIC METABOLIC PANEL
Anion gap: 6 (ref 5–15)
BUN: 12 mg/dL (ref 6–20)
CO2: 29 mmol/L (ref 22–32)
Calcium: 8.9 mg/dL (ref 8.9–10.3)
Chloride: 106 mmol/L (ref 98–111)
Creatinine, Ser: 0.9 mg/dL (ref 0.44–1.00)
GFR, Estimated: >60 mL/min (ref >60)
Glucose, Bld: 136 mg/dL — ABNORMAL HIGH (ref 70–99)
Potassium: 3.7 mmol/L (ref 3.5–5.1)
Sodium: 141 mmol/L (ref 135–145)

## 2022-08-03 NOTE — Patient Instructions (Signed)
Medication Instructions:  Your Physician recommend you continue on your current medication as directed.    *If you need a refill on your cardiac medications before your next appointment, please call your pharmacy*   Lab Work: Your provider would like for you to have following labs drawn today: (BMP).   Please go to the [facility] entrance and check in at the front desk.  You do not need an appointment.  They are open from 7am-6 pm.   If you have labs (blood work) drawn today and your tests are completely normal, you will receive your results only by: MyChart Message (if you have MyChart) OR A paper copy in the mail If you have any lab test that is abnormal or we need to change your treatment, we will call you to review the results.   Testing/Procedures: Your physician has requested that you have an echocardiogram. Echocardiography is a painless test that uses sound waves to create images of your heart. It provides your doctor with information about the size and shape of your heart and how well your heart's chambers and valves are working.   You may receive an ultrasound enhancing agent through an IV if needed to better visualize your heart during the echo. This procedure takes approximately one hour.  There are no restrictions for this procedure.  This will take place at [address], [place] [address]    Follow-Up: At [facility], you and your health needs are our priority.  As part of our continuing mission to provide you with exceptional heart care, we have created designated Provider Care Teams.  These Care Teams include your primary Cardiologist (physician) and Advanced Practice Providers (APPs -  Physician Assistants and Nurse Practitioners) who all work together to provide you with the care you need, when you need it.  We recommend signing up for the patient portal called "MyChart".  Sign up information is provided on this  After Visit Summary.  MyChart is used to connect with patients for Virtual Visits (Telemedicine).  Patients are able to view lab/test results, encounter notes, upcoming appointments, etc.  Non-urgent messages can be sent to your provider as well.   To learn more about what you can do with MyChart, go to [email or web address].    Your next appointment:   3 month(s)  The format for your next appointment:   In Person  Provider:   You may see Debbe Odea, MD or one of the following Advanced Practice Providers on your designated Care Team:   Nicolasa Ducking, NP Eula Listen, PA-C Cadence Fransico Michael, PA-C Charlsie Quest, NP

## 2022-08-03 NOTE — Progress Notes (Signed)
Potassium level has remained stable. No changes to medications at this time.

## 2022-08-07 ENCOUNTER — Ambulatory Visit: Payer: Medicaid Other | Admitting: Family Medicine

## 2022-08-07 ENCOUNTER — Encounter: Payer: Self-pay | Admitting: Family Medicine

## 2022-08-07 ENCOUNTER — Other Ambulatory Visit: Payer: Self-pay | Admitting: Family Medicine

## 2022-08-07 VITALS — BP 130/71 | HR 82 | Ht 64.0 in | Wt 239.0 lb

## 2022-08-07 DIAGNOSIS — K2100 Gastro-esophageal reflux disease with esophagitis, without bleeding: Secondary | ICD-10-CM

## 2022-08-07 DIAGNOSIS — E890 Postprocedural hypothyroidism: Secondary | ICD-10-CM

## 2022-08-07 DIAGNOSIS — K449 Diaphragmatic hernia without obstruction or gangrene: Secondary | ICD-10-CM | POA: Diagnosis not present

## 2022-08-07 DIAGNOSIS — K2289 Other specified disease of esophagus: Secondary | ICD-10-CM | POA: Diagnosis not present

## 2022-08-07 DIAGNOSIS — K200 Eosinophilic esophagitis: Secondary | ICD-10-CM

## 2022-08-07 MED ORDER — SUCRALFATE 1 G PO TABS: 1.0000 g | ORAL_TABLET | Freq: Three times a day (TID) | ORAL | 1 refills | Status: DC

## 2022-08-07 MED ORDER — EUTHYROX 125 MCG PO TABS: 125.0000 ug | ORAL_TABLET | Freq: Every day | ORAL | 3 refills | Status: DC

## 2022-08-07 NOTE — Progress Notes (Signed)
Subjective:    Patient ID: Morgan Stevens, female    DOB: 08-24-1966, 56 y.o.   MRN: [id number]  Morgan Stevens is a 56 y.o. female presenting on 08/07/2022 for Gastroesophageal Reflux, Discuss referral to GI, and Abdominal Pain   HPI  EOE GERD Hiatal Hernia  Last visit same discussion 05/30/22  Past history with similar condition, she was on PPI in the past Pantoprazole 2017, 40mg  with improvement, later GI took her off med and switched to Omeprazole 20mg  and Pepcid but then again phased off Omeprazole. She has now been only on Famotidine 20mg  BID   Previously with [facility] Dr Bonna Gains - 03/2019, had EGD with moderate sized hiatal hernia  Interval update, she has taken Pantoprazole 40mg  twice a day before meals without significant improvement in results.  Admits nausea upset stomach and heartburn, "flip flopping stomach"  She is now interested in GI referral  Takes some Pepto as well AS NEEDED, that makes her stool darker.  Admits diarrhea        08/07/2022    1:59 PM 11/22/2021    2:49 PM 11/09/2021    1:58 PM  Depression screen PHQ 2/9  Decreased Interest 1 1 0  Down, Depressed, Hopeless 1 1 0  PHQ - 2 Score 2 2 0  Altered sleeping 1 0   Tired, decreased energy 3 1   Change in appetite 3 0   Feeling bad or failure about yourself  1 1   Trouble concentrating 1 1   Moving slowly or fidgety/restless 0 0   Suicidal thoughts 0 0   PHQ-9 Score 11 5   Difficult doing work/chores Somewhat difficult Somewhat difficult     Social History   Tobacco Use   Smoking status: Never   Smokeless tobacco: Never  Vaping Use   Vaping Use: Never used  Substance Use Topics   Alcohol use: No    Alcohol/week: 0.0 standard drinks of alcohol   Drug use: No    Review of Systems Per HPI unless specifically indicated above     Objective:    BP 130/71   Pulse 82   Ht 5\' 4"  (1.626 m)   Wt 239 lb (108.4 kg)   LMP 07/11/2020   SpO2 100%   BMI 41.02 kg/m   Wt Readings from Last 3  Encounters:  08/07/22 239 lb (108.4 kg)  08/03/22 243 lb (110.2 kg)  07/30/22 241 lb 9.6 oz (109.6 kg)    Physical Exam Vitals and nursing note reviewed.  Constitutional:      General: She is not in acute distress.    Appearance: She is well-developed. She is not diaphoretic.     Comments: Well-appearing, comfortable, cooperative  HENT:     Head: Normocephalic and atraumatic.  Eyes:     General:        Right eye: No discharge.        Left eye: No discharge.     Conjunctiva/sclera: Conjunctivae normal.  Neck:     Thyroid: No thyromegaly.  Cardiovascular:     Rate and Rhythm: Normal rate and regular rhythm.     Heart sounds: Normal heart sounds. No murmur heard. Pulmonary:     Effort: Pulmonary effort is normal. No respiratory distress.     Breath sounds: Normal breath sounds. No wheezing or rales.  Musculoskeletal:        General: Normal range of motion.     Cervical back: Normal range of motion and neck supple.  Lymphadenopathy:  Cervical: No cervical adenopathy.  Skin:    General: Skin is warm and dry.     Findings: No erythema or rash.  Neurological:     Mental Status: She is alert and oriented to person, place, and time.  Psychiatric:        Behavior: Behavior normal.     Comments: Well groomed, good eye contact, normal speech and thoughts    Results for orders placed or performed during the hospital encounter of [id number]  Basic metabolic panel  Result Value Ref Range   Sodium 141 135 - 145 mmol/L   Potassium 3.7 3.5 - 5.1 mmol/L   Chloride 106 98 - 111 mmol/L   CO2 29 22 - 32 mmol/L   Glucose, Bld 136 (H) 70 - 99 mg/dL   BUN 12 6 - 20 mg/dL   Creatinine, Ser 0.90 0.44 - 1.00 mg/dL   Calcium 8.9 8.9 - 10.3 mg/dL   GFR, Estimated >60 >60 mL/min   Anion gap 6 5 - 15      Assessment & Plan:   Problem List Items Addressed This Visit     Columnar epithelial-lined lower esophagus   Relevant Medications   sucralfate (CARAFATE) 1 g tablet   Other Relevant  Orders   Ambulatory referral to Gastroenterology   GERD (gastroesophageal reflux disease)   Relevant Medications   sucralfate (CARAFATE) 1 g tablet   Other Relevant Orders   Ambulatory referral to Gastroenterology   Hiatal hernia   Relevant Orders   Ambulatory referral to Gastroenterology   Other Visit Diagnoses     Eosinophilic esophagitis    -  Primary   Relevant Medications   sucralfate (CARAFATE) 1 g tablet   Other Relevant Orders   Ambulatory referral to Gastroenterology       Orders Placed This Encounter  Procedures   Ambulatory referral to Gastroenterology    Referral Priority:   Routine    Referral Type:   Consultation    Referral Reason:   Specialty Services Required    Number of Visits Requested:   1   referral to Gastroenterology for 2nd opinion evaluation of worsening Eosinophilic Esophagitis GERD with moderate sized Hiatal Hernia. She has had prior EGD at [place] few years ago 2020 but now returning to care, worsening upper digestive symptoms and seeking new evaluation testing and possibly repair of hiatal hernia if indicated. She has failed most options for PPI therapy.   Keep PPI currently despite limited benefit Trial on Carafate AS NEEDED  Glucose 120-130s range, likely means A1c avg is closer to 6%, goal to keep reducing carb starch sugars to control the Pre Diabetes by the time we check again in April  Orders Placed This Encounter  Procedures   Ambulatory referral to Gastroenterology    Referral Priority:   Routine    Referral Type:   Consultation    Referral Reason:   Specialty Services Required    Number of Visits Requested:   1     Meds ordered this encounter  Medications   sucralfate (CARAFATE) 1 g tablet    Sig: Take 1 tablet (1 g total) by mouth 4 (four) times daily -  with meals and at bedtime. As needed for heartburn digestive symptoms    Dispense:  60 tablet    Refill:  1      Follow up plan: Return for keep upcoming apt April  2024.  Already has apt and labs in 11/2022  Nobie Putnam, [facility] 08/07/2022, 2:01 PM

## 2022-08-07 NOTE — Patient Instructions (Addendum)
Thank you for coming to the office today.  Referral sent to GI. You most likely have Hiatal Hernia causing your stomach acid GERD eosinophilic esophagitis symptoms.  [facility] [address] [place], [place], [place] [address] Phone: [phone]  Glucose 120-130s range, likely means A1c avg is closer to 6%, goal to keep reducing carb starch sugars to control the Pre Diabetes by the time we check again in April  Please schedule a Follow-up Appointment to: Return for keep upcoming apt April 2024.  If you have any other questions or concerns, please feel free to call the office or send a message through MyChart. You may also schedule an earlier appointment if necessary.  Additionally, you may be receiving a survey about your experience at our office within a few days to 1 week by e-mail or mail. We value your feedback.  Saralyn Pilar, DO [facility], [place]

## 2022-08-08 ENCOUNTER — Ambulatory Visit
Admission: RE | Admit: 2022-08-08 | Discharge: 2022-08-08 | Disposition: A | Discharge status: Home / Self Care | Payer: Medicaid Other | Source: Ambulatory Visit | Attending: Orthopedic Surgery | Admitting: Orthopedic Surgery

## 2022-08-08 DIAGNOSIS — G8929 Other chronic pain: Secondary | ICD-10-CM | POA: Insufficient documentation

## 2022-08-08 DIAGNOSIS — M67814 Other specified disorders of tendon, left shoulder: Secondary | ICD-10-CM | POA: Diagnosis not present

## 2022-08-08 DIAGNOSIS — S46012A Strain of muscle(s) and tendon(s) of the rotator cuff of left shoulder, initial encounter: Secondary | ICD-10-CM | POA: Diagnosis not present

## 2022-08-08 DIAGNOSIS — M25512 Pain in left shoulder: Secondary | ICD-10-CM | POA: Diagnosis not present

## 2022-08-08 DIAGNOSIS — M75102 Unspecified rotator cuff tear or rupture of left shoulder, not specified as traumatic: Secondary | ICD-10-CM

## 2022-08-08 DIAGNOSIS — Z9889 Other specified postprocedural states: Secondary | ICD-10-CM

## 2022-08-08 MED ORDER — LIDOCAINE HCL (PF) 1 % IJ SOLN
10.0000 mL | Freq: Once | INTRAMUSCULAR | Status: AC
  Administered 2022-08-08: 10 mL

## 2022-08-08 MED ORDER — GADOBUTROL 1 MMOL/ML IV SOLN
1.0000 mL | Freq: Once | INTRAVENOUS | Status: AC | PRN
  Administered 2022-08-08: 1 mL

## 2022-08-08 MED ORDER — SODIUM CHLORIDE (PF) 0.9 % IJ SOLN
20.0000 mL | Freq: Once | INTRAMUSCULAR | Status: AC
  Administered 2022-08-08: 20 mL via INTRAVENOUS

## 2022-08-08 MED ORDER — IOHEXOL 180 MG/ML  SOLN
20.0000 mL | Freq: Once | INTRAMUSCULAR | Status: AC | PRN
  Administered 2022-08-08: 20 mL

## 2022-08-08 MED ORDER — LIDOCAINE HCL (PF) 1 % IJ SOLN
10.0000 mL | Freq: Once | INTRAMUSCULAR | Status: AC
  Administered 2022-08-08: 10 mL
  Filled 2022-08-08: qty 10

## 2022-08-08 NOTE — Progress Notes (Signed)
IR brief note   I saw this patient this afternoon at approximately 3 p.m. for a shoulder injection using fluoroscopic guidance prior to MRI arthrogram.  I was called to the patient's room after our APP had attempted intra-articular injection of contrast material over 2 separate attempts without success.  I was told that the patient was having discomfort, and was agreeable to me trying an additional attempt for the injection.  I walked into the room and introducer myself to the patient.  I then marked were I was going to do the procedure using fluoroscopic landmarks.  Given her discomfort, I reintroduced local anesthetic to the intended injection area.  Then using fluoroscopic guidance, I guided a 20 gauge spinal needle towards the medial aspect of the humeral head.  During manipulation of the needle, the patient was noted to be in discomfort despite the administration of generous local anesthetic.  As soon as I had positioned of the needle within the intra-articular space, the patient had called out to "stop," and I informed her that the needle was already in place and no longer required any further manipulation.  This needle was placed within the joint space successfully on my first attempt.  The patient then made no further verbal comments as I injected intra-articular contrast material until I noted that there was additional discomfort, and I stopped injecting contrast material.  The needle was then promptly removed.    At this point, the patient was visibly distraught.  She accused me of "not respecting her wishes" because she claimed to have said "stop and remove the needle." I kindly replied that she did indeed say "stop," but that was after I was done manipulating the needle and was just finishing just the contrast injection. At no point did she say to remove the needle, which was confirmed by multiple staff members in the room. Despite my apologies for her uncomfortable experience and attempts to calm  her, she asked me to leave the room and stated she did want the MRI.  We politely informed her that if she did not get the MRI following the arthrogram, her imaging would be incomplete and insufficient to make a diagnosis.  Additionally, if she were to want the MRI arthrogram in the future, she would have to repeat the shoulder injection.    Olive Bass, MD  Vascular and Interventional Radiology 08/08/2022 3:48 PM

## 2022-08-08 NOTE — Progress Notes (Signed)
Patient presented to the [facility] Fluoroscopy department today for an image-guided left shoulder arthrogram for MR imaging. I attempted placement of the needle within the left shoulder joint twice without success. These attempts were uncomfortable/painful for the patient. I stopped the procedure and requested Dr. Juliette Alcide to attempt. I explained the issue to the patient and she was ok to wait and allow him to attempt.   Dr. Juliette Alcide worked to get the needle into the appropriate space within the joint. This caused the patient discomfort/pain which she verbalized. She asked Dr. Juliette Alcide to stop. At the moment she asked him to stop Dr. Juliette Alcide had the needle in the correct space and was thus able to stop manipulating the needle. He injected the contrast for the MR study. The injection of the contrast was uncomfortable for the patient and she cried.    The needle was removed and the patient stated in a loud voice that she told him to stop to which he responded that he did stop. She stated she told him to "stop and remove the needle." The patient did not request that the needle be removed.   The patient declined to have the MR study done and she requested to leave.   Alwyn Ren, [place] [phone] 08/08/2022, 3:18 PM

## 2022-08-09 ENCOUNTER — Telehealth: Payer: Self-pay | Admitting: Orthopedic Surgery

## 2022-08-09 ENCOUNTER — Telehealth: Payer: Self-pay | Admitting: Radiology

## 2022-08-09 DIAGNOSIS — Z9889 Other specified postprocedural states: Secondary | ICD-10-CM

## 2022-08-09 NOTE — Progress Notes (Signed)
Review of the MRI with contrast shows that the patient has near complete healing of the rotator cuff repair results will be forwarded to her by phone conversation and arrangements will be made to resume physical therapy

## 2022-08-09 NOTE — Telephone Encounter (Signed)
I called her to advise therapy ordered previously she used all her therapy benefits, she can discuss with physical therapy.

## 2022-08-09 NOTE — Telephone Encounter (Signed)
Shoulder MRI was done with contrast rotator cuff repair is intact no surgery needed recommend more physical therapy will order

## 2022-08-09 NOTE — Telephone Encounter (Signed)
-----   Message from Vickki Hearing, MD sent at 08/09/2022  9:43 AM EST ----- Huel Coventry  ----- Message ----- From: Caffie Damme, RT Sent: 08/09/2022   9:35 AM EST To: Vickki Hearing, MD  I'm glad she healed it, who is the patient? So I can celebrate AND order therapy  ----- Message ----- From: Vickki Hearing, MD Sent: 08/09/2022   9:26 AM EST To: Caffie Damme, RT  The patient's MRI shows that the repair healed.  I left her a message indicating that.  She will need to have a new order for physical therapy to address stiffness loss of motion and weakness left shoulder  Go ahead and order therapy

## 2022-08-26 ENCOUNTER — Other Ambulatory Visit: Payer: Self-pay | Admitting: Family Medicine

## 2022-08-26 DIAGNOSIS — I10 Essential (primary) hypertension: Secondary | ICD-10-CM

## 2022-08-27 ENCOUNTER — Encounter: Payer: Self-pay | Admitting: Orthopedic Surgery

## 2022-08-27 ENCOUNTER — Other Ambulatory Visit: Payer: Self-pay | Admitting: Family Medicine

## 2022-08-27 DIAGNOSIS — G8929 Other chronic pain: Secondary | ICD-10-CM

## 2022-08-27 DIAGNOSIS — M67814 Other specified disorders of tendon, left shoulder: Secondary | ICD-10-CM

## 2022-08-28 ENCOUNTER — Telehealth: Payer: Self-pay | Admitting: Radiology

## 2022-08-28 DIAGNOSIS — K222 Esophageal obstruction: Secondary | ICD-10-CM | POA: Diagnosis not present

## 2022-08-28 DIAGNOSIS — R1310 Dysphagia, unspecified: Secondary | ICD-10-CM | POA: Diagnosis not present

## 2022-08-28 DIAGNOSIS — K449 Diaphragmatic hernia without obstruction or gangrene: Secondary | ICD-10-CM | POA: Diagnosis not present

## 2022-08-28 DIAGNOSIS — K219 Gastro-esophageal reflux disease without esophagitis: Secondary | ICD-10-CM | POA: Diagnosis not present

## 2022-08-28 DIAGNOSIS — Z9889 Other specified postprocedural states: Secondary | ICD-10-CM

## 2022-08-28 DIAGNOSIS — K5901 Slow transit constipation: Secondary | ICD-10-CM | POA: Diagnosis not present

## 2022-08-28 NOTE — Telephone Encounter (Signed)
Requested Prescriptions  Pending Prescriptions Disp Refills   Baclofen 5 MG TABS [Pharmacy Med Name: Baclofen 5 MG Oral Tablet] 180 tablet 0    Sig: TAKE 1 TO 2 TABLETS BY MOUTH THREE TIMES DAILY AS NEEDED FOR MUSCLE SPASM     Analgesics:  Muscle Relaxants - baclofen Passed - 08/27/2022 10:46 AM      Passed - Cr in normal range and within 180 days    Creat  Date Value Ref Range Status  06/27/2021 0.78 0.50 - 1.03 mg/dL Final   Creatinine, Ser  Date Value Ref Range Status  08/03/2022 0.90 0.44 - 1.00 mg/dL Final         Passed - eGFR is 30 or above and within 180 days    EGFR (African American)  Date Value Ref Range Status  08/02/2014 >60 >75m/min Final  05/06/2014 >60  Final   GFR calc Af Amer  Date Value Ref Range Status  11/22/2019 >60 >60 mL/min Final   EGFR (Non-African Amer.)  Date Value Ref Range Status  08/02/2014 >60 >620mmin Final    Comment:    eGFR values <6051min/1.73 m2 may be an indication of chronic kidney disease (CKD). Calculated eGFR, using the MRDR Study equation, is useful in  patients with stable renal function. The eGFR calculation will not be reliable in acutely ill patients when serum creatinine is changing rapidly. It is not useful in patients on dialysis. The eGFR calculation may not be applicable to patients at the low and high extremes of body sizes, pregnant women, and vegetarians.   05/06/2014 >60  Final    Comment:    eGFR values <38m32mn/1.73 m2 may be an indication of chronic kidney disease (CKD). Calculated eGFR is useful in patients with stable renal function. The eGFR calculation will not be reliable in acutely ill patients when serum creatinine is changing rapidly. It is not useful in  patients on dialysis. The eGFR calculation may not be applicable to patients at the low and high extremes of body sizes, pregnant women, and vegetarians.    GFR, Estimated  Date Value Ref Range Status  08/03/2022 >60 >60 mL/min Final     Comment:    (NOTE) Calculated using the CKD-EPI Creatinine Equation (2021)    eGFR  Date Value Ref Range Status  06/27/2021 91 > OR = 60 mL/min/1.73m261mal    Comment:    The eGFR is based on the CKD-EPI 2021 equation. To calculate  the new eGFR from a previous Creatinine or Cystatin C result, go to https://www.kidney.org/professionals/ kdoqi/gfr%5Fcalculator          Passed - Valid encounter within last 6 months    Recent Outpatient Visits           3 weeks ago Eosinophilic esophagitis   [place]  3 months ago Eosinophilic esophagitis   [place]  3 months ago Gastroesophageal reflux disease, unspecified whether esophagitis present   [place]  4 months ago Diastasis recti   [place]  7 months ago [facility]      Future [facility]             In 2 months [facility] [place]  3 months [place], [facility], [facility]

## 2022-08-28 NOTE — Telephone Encounter (Signed)
Requested Prescriptions  Pending Prescriptions Disp Refills   amLODipine (NORVASC) 2.5 MG tablet [Pharmacy Med Name: amLODIPine Besylate 2.5 MG Oral Tablet] 90 tablet 0    Sig: Take 1 tablet by mouth once daily     Cardiovascular: Calcium Channel Blockers 2 Passed - 08/26/2022  9:16 AM      Passed - Last BP in normal range    BP Readings from Last 1 Encounters:  08/07/22 130/71         Passed - Last Heart Rate in normal range    Pulse Readings from Last 1 Encounters:  08/07/22 82         Passed - Valid encounter within last 6 months    Recent Outpatient Visits           3 weeks ago Eosinophilic esophagitis   [place], DO   3 months ago Eosinophilic esophagitis   [place], DO   3 months ago Gastroesophageal reflux disease, unspecified whether esophagitis present   [place], DO   4 months ago Diastasis recti   [place], DO   7 months ago No-show for appointment   [facility] Olin Hauser, DO       Future Appointments             In 2 months Agbor-Etang, Aaron Edelman, MD [place]. [place]   In 3 months Karamalegos, Devonne Doughty, DO [facility], [facility]

## 2022-08-28 NOTE — Telephone Encounter (Signed)
Patient wants to go to [facility] for therapy, fixed order

## 2022-09-06 ENCOUNTER — Other Ambulatory Visit: Payer: Self-pay | Admitting: Family Medicine

## 2022-09-06 DIAGNOSIS — J9801 Acute bronchospasm: Secondary | ICD-10-CM

## 2022-09-06 DIAGNOSIS — J3089 Other allergic rhinitis: Secondary | ICD-10-CM

## 2022-09-06 DIAGNOSIS — R053 Chronic cough: Secondary | ICD-10-CM

## 2022-09-06 NOTE — Telephone Encounter (Signed)
Requested Prescriptions  Pending Prescriptions Disp Refills   montelukast (SINGULAIR) 10 MG tablet [Pharmacy Med Name: Montelukast Sodium 10 MG Oral Tablet] 90 tablet 0    Sig: TAKE 1 TABLET BY MOUTH AT BEDTIME     Pulmonology:  Leukotriene Inhibitors Passed - 09/06/2022 11:02 AM      Passed - Valid encounter within last 12 months    Recent Outpatient Visits           1 month ago Eosinophilic esophagitis   [place], DO   3 months ago Eosinophilic esophagitis   [place], DO   4 months ago Gastroesophageal reflux disease, unspecified whether esophagitis present   [place], DO   5 months ago Diastasis recti   [place], DO   8 months ago No-show for appointment   [facility] Olin Hauser, DO       Future Appointments             In 2 months Agbor-Etang, Aaron Edelman, MD [facility] [place]. [place]   In 2 months [place], Devonne Doughty, DO [facility], [facility]            '

## 2022-09-09 ENCOUNTER — Emergency Department
Admission: EM | Admit: 2022-09-09 | Discharge: 2022-09-09 | Disposition: A | Discharge status: Home / Self Care | Payer: Medicaid Other | Attending: Emergency Medicine | Admitting: Emergency Medicine

## 2022-09-09 ENCOUNTER — Emergency Department: Payer: Medicaid Other

## 2022-09-09 DIAGNOSIS — I672 Cerebral atherosclerosis: Secondary | ICD-10-CM | POA: Diagnosis not present

## 2022-09-09 DIAGNOSIS — I6521 Occlusion and stenosis of right carotid artery: Secondary | ICD-10-CM | POA: Diagnosis not present

## 2022-09-09 DIAGNOSIS — M4802 Spinal stenosis, cervical region: Secondary | ICD-10-CM | POA: Diagnosis not present

## 2022-09-09 DIAGNOSIS — K029 Dental caries, unspecified: Secondary | ICD-10-CM | POA: Diagnosis not present

## 2022-09-09 DIAGNOSIS — E876 Hypokalemia: Secondary | ICD-10-CM

## 2022-09-09 DIAGNOSIS — R2990 Unspecified symptoms and signs involving the nervous system: Secondary | ICD-10-CM

## 2022-09-09 DIAGNOSIS — R200 Anesthesia of skin: Secondary | ICD-10-CM | POA: Insufficient documentation

## 2022-09-09 LAB — DIFFERENTIAL
Abs Immature Granulocytes: 0.01 10*3/uL (ref 0.00–0.07)
Basophils Absolute: 0.1 10*3/uL (ref 0.0–0.1)
Basophils Relative: 1 %
Eosinophils Absolute: 0.2 10*3/uL (ref 0.0–0.5)
Eosinophils Relative: 3 %
Immature Granulocytes: 0 %
Lymphocytes Relative: 42 %
Lymphs Abs: 2.7 10*3/uL (ref 0.7–4.0)
Monocytes Absolute: 0.5 10*3/uL (ref 0.1–1.0)
Monocytes Relative: 8 %
Neutro Abs: 2.9 10*3/uL (ref 1.7–7.7)
Neutrophils Relative %: 46 %

## 2022-09-09 LAB — URINALYSIS, ROUTINE W REFLEX MICROSCOPIC
Bilirubin Urine: NEGATIVE
Glucose, UA: NEGATIVE mg/dL
Hgb urine dipstick: NEGATIVE
Ketones, ur: NEGATIVE mg/dL
Leukocytes,Ua: NEGATIVE
Nitrite: NEGATIVE
Protein, ur: NEGATIVE mg/dL
Specific Gravity, Urine: 1.031 — ABNORMAL HIGH (ref 1.005–1.030)
pH: 7 (ref 5.0–8.0)

## 2022-09-09 LAB — URINE DRUG SCREEN, QUALITATIVE (ARMC ONLY)
Amphetamines, Ur Screen: NOT DETECTED
Barbiturates, Ur Screen: NOT DETECTED
Benzodiazepine, Ur Scrn: NOT DETECTED
Cannabinoid 50 Ng, Ur ~~LOC~~: NOT DETECTED
Cocaine Metabolite,Ur ~~LOC~~: NOT DETECTED
MDMA (Ecstasy)Ur Screen: NOT DETECTED
Methadone Scn, Ur: NOT DETECTED
Opiate, Ur Screen: NOT DETECTED
Phencyclidine (PCP) Ur S: NOT DETECTED
Tricyclic, Ur Screen: NOT DETECTED

## 2022-09-09 LAB — CBC
HCT: 37.7 % (ref 36.0–46.0)
Hemoglobin: 13 g/dL (ref 12.0–15.0)
MCH: 30.2 pg (ref 26.0–34.0)
MCHC: 34.5 g/dL (ref 30.0–36.0)
MCV: 87.7 fL (ref 80.0–100.0)
Platelets: 282 10*3/uL (ref 150–400)
RBC: 4.3 MIL/uL (ref 3.87–5.11)
RDW: 12.3 % (ref 11.5–15.5)
WBC: 6.4 10*3/uL (ref 4.0–10.5)
nRBC: 0 % (ref 0.0–0.2)

## 2022-09-09 LAB — COMPREHENSIVE METABOLIC PANEL
ALT: 38 U/L (ref 0–44)
AST: 24 U/L (ref 15–41)
Albumin: 4 g/dL (ref 3.5–5.0)
Alkaline Phosphatase: 47 U/L (ref 38–126)
Anion gap: 8 (ref 5–15)
BUN: 10 mg/dL (ref 6–20)
CO2: 25 mmol/L (ref 22–32)
Calcium: 8.7 mg/dL — ABNORMAL LOW (ref 8.9–10.3)
Chloride: 103 mmol/L (ref 98–111)
Creatinine, Ser: 0.91 mg/dL (ref 0.44–1.00)
GFR, Estimated: >60 mL/min (ref >60)
Glucose, Bld: 130 mg/dL — ABNORMAL HIGH (ref 70–99)
Potassium: 3.4 mmol/L — ABNORMAL LOW (ref 3.5–5.1)
Sodium: 136 mmol/L (ref 135–145)
Total Bilirubin: 0.7 mg/dL (ref 0.3–1.2)
Total Protein: 7.5 g/dL (ref 6.5–8.1)

## 2022-09-09 LAB — CBG MONITORING, ED: Glucose-Capillary: 128 mg/dL — ABNORMAL HIGH (ref 70–99)

## 2022-09-09 LAB — PROTIME-INR
INR: 1 (ref 0.8–1.2)
Prothrombin Time: 13 seconds (ref 11.4–15.2)

## 2022-09-09 LAB — APTT: aPTT: 28 seconds (ref 24–36)

## 2022-09-09 LAB — ETHANOL: Alcohol, Ethyl (B): <10 mg/dL (ref <10)

## 2022-09-09 MED ORDER — IOHEXOL 350 MG/ML SOLN
75.0000 mL | Freq: Once | INTRAVENOUS | Status: AC | PRN
  Administered 2022-09-09: 75 mL via INTRAVENOUS

## 2022-09-09 MED ORDER — POTASSIUM CHLORIDE CRYS ER 20 MEQ PO TBCR: 20.0000 meq | EXTENDED_RELEASE_TABLET | Freq: Two times a day (BID) | ORAL | 0 refills | Status: DC

## 2022-09-09 MED ORDER — PREDNISONE 10 MG (21) PO TBPK: ORAL_TABLET | ORAL | 0 refills | Status: DC

## 2022-09-09 NOTE — ED Provider Notes (Addendum)
3:42 PM Assumed care for off going team.   Blood pressure 134/74, pulse 65, temperature 98 F (36.7 C), temperature source Oral, resp. rate 16, height 5\' 4"  (1.626 m), weight 108 kg, last menstrual period 07/11/2020, SpO2 99 %.  See their HPI for full report but in brief mri pending  MPRESSION: 1. No significant interval change in multilevel cervical spondylosis, as detailed above. 2. Moderate-severe canal stenosis at C5-C6 and moderate canal stenosis at C6-C7. 3. Mild canal stenosis at C3-C4 and C4-C5. 4. Mild left foraminal stenosis at C5-C6. Mild right foraminal stenosis at C4-C5 and C6-C7.   Repeat evaluation patient has equal grip strength is equal strength in her legs.  No numbness or tingling in arms and legs.  Reports symptoms much improved.  Dr. Rory Percy did call me and recommended that patient follow-up outpatient with neurosurgery for continued stenosis to see if patient could need decompression in the future but at this time there is no signs for acute neurosurgical intervention.    We discussed a trial of Sterapred to see if this could help her symptoms.  We discussed pros and cons and she would like to proceed.  Her potassium was also slightly low and could be contributing to some tingling so I did write a prescription for this as well for a few doses.  Patient will be discharged home and understands to call neurosurgery to make a follow-up appointment for her MRI.   Vanessa [place], MD 09/09/22 1622    Vanessa [place], MD 09/09/22 [phone]

## 2022-09-09 NOTE — Consult Note (Signed)
Neurology Consultation  Reason for Consult: Left-sided tingling, numbness Referring Physician: Dr. Marisa Severin  CC: Left-sided tingling and numbness  History is obtained from: Patient, chart  HPI: Morgan Stevens is a 57 y.o. female past medical history of chronic cervicalgia, hyperlipidemia, hypertension, obesity, spinal stenosis, hypothyroidism, presenting to the emergency room for evaluation of left-sided tingling, some numbness and slight weakness.  Reports going to bed at 12:30 AM and woke up around 5 this morning noticing that the left arm and leg felt tingly.  Also felt some numbness around the left cheek and lips on the left side.  Initially at the triage evaluation, had some diminished left grip strength and left arm drift but symptoms have since resolved and has only mild residual left facial tingling. Had been seen last year for similar symptoms but on the right.  TIA versus C-spine etiology for her symptoms was entertained and it was felt less likely to be TIA and more likely to be paresthesias related to spinal cord compression. She reports that she had followed up with neurosurgery with recommendations for conservative management for now but was told that she might need surgery in the future.  Her neck pain has worsened over the last 8 to 10 months after she has had her last C-spine imaging. Did not report any difficulties with her gait.  Denies any bowel bladder incontinence.  Denies any chest pain shortness of breath.  Denies any visual symptoms.  No history of migraines.   LKW: 12:30 AM IV thrombolysis given?: no, outside the window EVT: No ELVO Premorbid modified Rankin scale (mRS):0   ROS: Full ROS was performed and is negative except as noted in the HPI.   Past Medical History:  Diagnosis Date   Chronic headaches    several per week   Gastritis    GERD (gastroesophageal reflux disease)    Hiatal hernia    Hyperlipidemia    Hypertension    Obesity (BMI 30-39.9)    Reflux     Spinal stenosis    Thyroid disease    Vertigo    rare   Wears contact lenses      Family History  Problem Relation Age of Onset   Congestive Heart Failure Mother    Breast cancer Neg Hx      Social History:   reports that she has never smoked. She has never used smokeless tobacco. She reports that she does not drink alcohol and does not use drugs.  Medications No current facility-administered medications for this encounter.  Current Outpatient Medications:    acetaminophen (TYLENOL) 500 MG tablet, Take 2 tablets (1,000 mg total) by mouth every 8 (eight) hours., Disp: 90 tablet, Rfl: 2   albuterol (VENTOLIN HFA) 108 (90 Base) MCG/ACT inhaler, Inhale 2 puffs into the lungs every 4 (four) hours as needed for wheezing or shortness of breath., Disp: 6.7 g, Rfl: 2   amLODipine (NORVASC) 2.5 MG tablet, Take 1 tablet by mouth once daily, Disp: 90 tablet, Rfl: 0   atorvastatin (LIPITOR) 20 MG tablet, Take 1 tablet (20 mg total) by mouth at bedtime., Disp: 90 tablet, Rfl: 1   Baclofen 5 MG TABS, TAKE 1 TO 2 TABLETS BY MOUTH THREE TIMES DAILY AS NEEDED FOR MUSCLE SPASM, Disp: 180 tablet, Rfl: 0   Calcium Carbonate-Simethicone (TUMS GAS RELIEF CHEWY BITES PO), Take by mouth., Disp: , Rfl:    EUTHYROX 125 MCG tablet, Take 1 tablet (125 mcg total) by mouth daily before breakfast., Disp: 90 tablet, Rfl: 3  fluticasone (FLONASE) 50 MCG/ACT nasal spray, Place 2 sprays into both nostrils daily. Use for 4-6 weeks then stop and use seasonally or as needed., Disp: 16 g, Rfl: 3   gabapentin (NEURONTIN) 100 MG capsule, Start 1 capsule daily at bedtime, increase by 1 cap every 2-3 days as tolerated up to 3 doses = 300mg  at bedtime (Patient taking differently: Start 1 capsule daily at bedtime, increase by 1 cap every 2-3 days as tolerated up to 3 doses = 300mg  at bedtime (01/30/22 takes 1 AM, 2 PM)), Disp: 90 capsule, Rfl: 1   montelukast (SINGULAIR) 10 MG tablet, TAKE 1 TABLET BY MOUTH AT BEDTIME, Disp: 90  tablet, Rfl: 0   pantoprazole (PROTONIX) 40 MG tablet, Take 1 tablet (40 mg total) by mouth daily before breakfast., Disp: 90 tablet, Rfl: 3   sucralfate (CARAFATE) 1 g tablet, Take 1 tablet (1 g total) by mouth 4 (four) times daily -  with meals and at bedtime. As needed for heartburn digestive symptoms, Disp: 60 tablet, Rfl: 1   Exam: Current vital signs: BP 130/84   Pulse 78   Temp 98 F (36.7 C) (Oral)   Resp 14   Ht 5\' 4"  (1.626 m)   Wt 108 kg   LMP 07/11/2020   SpO2 97%   BMI 40.85 kg/m  Vital signs in last 24 hours: Temp:  [97.8 F (36.6 C)-98 F (36.7 C)] 98 F (36.7 C) (01/14 1230) Pulse Rate:  [61-78] 78 (01/14 1230) Resp:  [13-18] 14 (01/14 1230) BP: (124-149)/(70-84) 130/84 (01/14 1230) SpO2:  [96 %-100 %] 97 % (01/14 1230) Weight:  [202 kg] 108 kg (01/14 0614) General: Awake alert in no distress HEENT: Normocephalic atraumatic Lungs: Clear Cardiovascular: Regular rhythm Abdomen nondistended nontender Neurological exam She is awake alert oriented x 3 No dysarthria No evidence of aphasia Cranials 2-12 intact Motor examination with no drift on the right, there is mild weakness 4+/5 on the left arm and leg in comparison to the right but no vertical drift. Sensation: Is also intact bilaterally except for left face and lip where she feels that she has some tingling. Coordination intact DTRs are pretty brisk for a woman her age and body habitus and I question mild Hoffmann sign on the left upper extremity.  Hoffmann's negative on the right upper extremity.  Plantar reflex equivocal on the right, question fanning of the toes without an actual upgoing toe on the left.  Somewhat limited by withdrawal.  NIHSS-1 for subjective sensory  Labs I have reviewed labs in epic and the results pertinent to this consultation are:  CBC    Component Value Date/Time   WBC 6.4 09/09/2022 0635   RBC 4.30 09/09/2022 0635   HGB 13.0 09/09/2022 0635   HGB 11.8 (L) 08/02/2014 1624    HCT 37.7 09/09/2022 0635   HCT 35.6 08/02/2014 1624   PLT 282 09/09/2022 0635   PLT 258 08/02/2014 1624   MCV 87.7 09/09/2022 0635   MCV 90 08/02/2014 1624   MCH 30.2 09/09/2022 0635   MCHC 34.5 09/09/2022 0635   RDW 12.3 09/09/2022 0635   RDW 13.5 08/02/2014 1624   LYMPHSABS 2.7 09/09/2022 0635   LYMPHSABS 2.9 09/30/2013 2005   MONOABS 0.5 09/09/2022 0635   MONOABS 0.6 09/30/2013 2005   EOSABS 0.2 09/09/2022 0635   EOSABS 0.2 09/30/2013 2005   BASOSABS 0.1 09/09/2022 0635   BASOSABS 0.1 09/30/2013 2005    CMP     Component Value Date/Time   NA  136 09/09/2022 0635   NA 139 08/02/2014 1624   K 3.4 (L) 09/09/2022 0635   K 3.9 08/02/2014 1624   CL 103 09/09/2022 0635   CL 103 08/02/2014 1624   CO2 25 09/09/2022 0635   CO2 27 08/02/2014 1624   GLUCOSE 130 (H) 09/09/2022 0635   GLUCOSE 113 (H) 08/02/2014 1624   BUN 10 09/09/2022 0635   BUN 10 08/02/2014 1624   CREATININE 0.91 09/09/2022 0635   CREATININE 0.78 06/27/2021 0826   CALCIUM 8.7 (L) 09/09/2022 0635   CALCIUM 8.6 08/02/2014 1624   PROT 7.5 09/09/2022 0635   PROT 7.3 08/02/2014 1624   ALBUMIN 4.0 09/09/2022 0635   ALBUMIN 3.6 08/02/2014 1624   AST 24 09/09/2022 0635   AST 22 08/02/2014 1624   ALT 38 09/09/2022 0635   ALT 26 08/02/2014 1624   ALKPHOS 47 09/09/2022 0635   ALKPHOS 40 (L) 08/02/2014 1624   BILITOT 0.7 09/09/2022 0635   BILITOT 0.4 08/02/2014 1624   GFRNONAA >60 09/09/2022 0635   GFRNONAA >60 08/02/2014 1624   GFRNONAA >60 05/06/2014 2154   GFRAA >60 11/22/2019 0915   GFRAA >60 08/02/2014 1624   GFRAA >60 05/06/2014 2154   Imaging I have reviewed the images obtained:  CT-head, CT angiography head and neck: Stable and normal for age CT head.  CTA neck degraded by motion at the carotid bifurcations but no evidence of significant carotid plaque or stenosis.  Stable and negative intracranial CTA. MR brain: Normal noncontrasted MRI stable since 2022.  Partially empty sella-likely  incidental-often a normal anatomical variant but can be associated with IIHT   Assessment:  57 year old woman past history of obesity hypertension hypothyroidism hyperlipidemia with acute onset of left face, lip and left arm and leg tingling along with some numbness and possible left-sided weakness which has mostly resolved with the exception of some tingling and numbness on the left cheek.  She was evaluated by brain imaging-CT, CTA head and neck and MRI of the brain which were all unremarkable. She continues to have the symptoms despite a negative imaging for which neurological consultation was obtained. On my evaluation, she has very subtle left arm and leg weakness along with hyperreflexia which I would consider is out of proportion for what I would expect for her age on both right and left with questionable left Hoffmann sign raising suspicion of a compressive spinal cord etiology.  She does not have florid myelopathic features on exam. Negative brain imaging now and last time when she had right-sided symptoms was also negative makes me very less suspicious for this being an actual neurovascular-stroke/TIA like process and more related to her cervical cord.  Cervical cord lesions can involve face in addition to the arm and leg especially at the C4-C7 level where she seems to have a lot of her disease based on her prior MRIs.  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 If that is also negative, she can follow-up with outpatient neurosurgery.  She is an established patient of [facility] and can follow-up with them in the coming weeks. Plan was discussed with Dr. Marisa Severin   -- Milon Dikes,  MD Neurologist [facility] Pager: [phone]   ADDENDUM Mr c-spine completed and reviewed-no significant changein  the multilevel DJD but there is moderate to severe stenosis at C5-6, 6-7 and mild canal stenosis C5-6. Mild b/l foraminal stenosis C4-5 and 6-7 Not convinced this is a TIA. Needs outpatient neurosurgery follow up for the spinal stenosis. D/W Dr Jari Pigg  -- Amie [place], MD Neurologist [facility] Pager: [phone]

## 2022-09-09 NOTE — Discharge Instructions (Addendum)
Call neurosurgery and follow-up due to your spinal stenosis as this can cause worsening symptoms and may need surgery in the future.  Will trial some steroids to see if this helps with nerve inflammation.  Potassium is also slightly low so I ordered you few pills to help with that as well.  Return to the ER if develop worsening symptoms, weakness in one arm, numbness in one arms, fevers or any other concerns  MPRESSION: 1. No significant interval change in multilevel cervical spondylosis, as detailed above. 2. Moderate-severe canal stenosis at C5-C6 and moderate canal stenosis at C6-C7. 3. Mild canal stenosis at C3-C4 and C4-C5. 4. Mild left foraminal stenosis at C5-C6. Mild right foraminal stenosis at C4-C5 and C6-C7.

## 2022-09-09 NOTE — ED Triage Notes (Signed)
Pt LSN was 12pm when she went to sleep, woke up at 5am with L sided weakness and tingling. Decreased grip strength and L arm and left drift

## 2022-09-09 NOTE — ED Provider Notes (Signed)
-----------------------------------------   8:27 AM on 09/09/2022 -----------------------------------------   I took over care of this patient from Dr. Ellender Hose.  The CTA is negative.  I consulted Dr. Rory Percy from neurology and discussed the case with him; he recommends obtaining an MRI.  ----------------------------------------- 1:25 PM on 09/09/2022 -----------------------------------------  MR brain shows no acute findings.  Dr. Rory Percy evaluated the patient and advises that given the exam and the negative MRI there is no evidence of acute stroke.  He is more concerned for cervical compression so recommends an MRI of the cervical spine.  ----------------------------------------- 3:42 PM on 09/09/2022 -----------------------------------------  MR cervical spine read is pending.  I have signed the patient out to the oncoming ED physician Dr. Jari Pigg.   Arta Silence, MD 09/09/22 [phone]

## 2022-09-09 NOTE — ED Provider Notes (Signed)
[facility] Provider Note    Event Date/Time   First MD Initiated Contact with Patient 09/09/22 [phone]     (approximate)   History   Stroke Symptoms   HPI  Armina Galloway is a 57 y.o. female  here with stroke symptoms. Pt reports that she was last normal at around 12:30 when going to bed. She had no symptoms at that time. When she awoke, she felt "numb" and slightly weak in her left arm and leg. This has persisted since onset. She was able to walk. She has not had any facial numbness or weakness. No speech difficulty. No visual changes. No neglect. No pain. No headache.        Physical Exam   Triage Vital Signs: ED Triage Vitals  Enc Vitals Group     BP 09/09/22 0615 (!) 149/82     Pulse Rate 09/09/22 0615 70     Resp 09/09/22 0615 18     Temp 09/09/22 0615 97.8 F (36.6 C)     Temp Source 09/09/22 0615 Oral     SpO2 09/09/22 0615 100 %     Weight 09/09/22 0614 238 lb (108 kg)     Height 09/09/22 0614 5\' 4"  (1.626 m)     Head Circumference --      Peak Flow --      Pain Score 09/09/22 0614 0     Pain Loc --      Pain Edu? --      Excl. in [place]? --     Most recent vital signs: Vitals:   09/09/22 0630 09/09/22 0700  BP: (!) 146/76 137/70  Pulse: 67 65  Resp: 17 14  Temp:    SpO2: 99% 98%     General: Awake, no distress.  CV:  Good peripheral perfusion. RRR.  Resp:  Normal effort. Lungs CTAB. Abd:  No distention. No tenderness. Other:  CNII-XII intact with exception of reported left facial numbness. Strength 5/5 bilateral UE and LE though very subtle left pronator drift noted. Normal FTN bilaterally. Strength subjectively weaker LLE. Diminished but intact sensation to light touch throughout LUE and LLE.    ED Results / Procedures / Treatments   Labs (all labs ordered are listed, but only abnormal results are displayed) Labs Reviewed  COMPREHENSIVE METABOLIC PANEL - Abnormal; Notable for the following components:      Result Value    Potassium 3.4 (*)    Glucose, Bld 130 (*)    Calcium 8.7 (*)    All other components within normal limits  CBG MONITORING, ED - Abnormal; Notable for the following components:   Glucose-Capillary 128 (*)    All other components within normal limits  ETHANOL  PROTIME-INR  APTT  CBC  DIFFERENTIAL  URINE DRUG SCREEN, QUALITATIVE ([facility] ONLY)  URINALYSIS, ROUTINE W REFLEX MICROSCOPIC  POC URINE PREG, ED     EKG Normal sinus rhythm, VR 71. PR 177, QRS 89, QTc 455. NO acute St elevations. No ischemia or infarct.   RADIOLOGY    I also independently reviewed and agree with radiologist interpretations.   PROCEDURES:  Critical Care performed: No   MEDICATIONS ORDERED IN ED: Medications - No data to display   IMPRESSION / MDM / [place] / ED COURSE  I reviewed the triage vital signs and the nursing notes.  Differential diagnosis includes, but is not limited to, CVA, TIA, cervical radiculopathy, neuropathy/paresthesias  Patient's presentation is most consistent with acute presentation with potential threat to life or bodily function.  The patient is on the cardiac monitor to evaluate for evidence of arrhythmia and/or significant heart rate changes.**}  57 yo F here with acute onset L sided arm and leg weakness/numbness. LKN >6 hr ago. VAN negative. Concern for CVA vs TIA on exam. She seems to be somewhat improving clinically. VSS. No recent trauma. Will check CT Angio, likely MR for stroke evaluation. Pt in agreement with this plan.   FINAL CLINICAL IMPRESSION(S) / ED DIAGNOSES   Final diagnoses:  Stroke-like symptoms     Rx / DC Orders   ED Discharge Orders     None        Note:  This document was prepared using Dragon voice recognition software and may include unintentional dictation errors.   Duffy Bruce, MD 09/09/22 [phone]

## 2022-09-18 ENCOUNTER — Ambulatory Visit: Payer: Self-pay | Admitting: Neurosurgery

## 2022-09-20 ENCOUNTER — Encounter: Payer: Self-pay | Admitting: Family Medicine

## 2022-09-20 ENCOUNTER — Ambulatory Visit: Payer: Medicaid Other | Admitting: Family Medicine

## 2022-09-20 ENCOUNTER — Ambulatory Visit: Payer: Medicaid Other | Attending: Cardiology

## 2022-09-20 DIAGNOSIS — R0609 Other forms of dyspnea: Secondary | ICD-10-CM | POA: Diagnosis not present

## 2022-09-21 LAB — ECHOCARDIOGRAM COMPLETE
AR max vel: 2.56 cm2
AV Area VTI: 2.75 cm2
AV Area mean vel: 2.59 cm2
AV Mean grad: 4 mmHg
AV Peak grad: 6.7 mmHg
Ao pk vel: 1.29 m/s
Area-P 1/2: 4.29 cm2
Calc EF: 51.8 %
S' Lateral: 2.8 cm
Single Plane A2C EF: 52.2 %
Single Plane A4C EF: 53.2 %

## 2022-09-24 NOTE — Progress Notes (Unsigned)
Referring Physician:  Arta Silence, MD [address],  [place] [id number]  Primary Physician:  Olin Hauser, DO  History of Present Illness: 09/25/2022 [name] is here today with a chief complaint of neck pain into her arms bilaterally.  She has numbness and weakness in her left arm.  This began approximately 2 weeks ago.  She has not had any trips or falls.   She has had intermittent episodes of this over the past 2 years, but worsening recently.  Her pain is currently aching and is about a 6 out of 10.  Nothing is really helped.  Bowel/Bladder Dysfunction: none  Conservative measures:  Physical therapy:  has not participated Multimodal medical therapy including regular antiinflammatories:  prednisone, tylenol, gabapentin Injections:  has not received epidural steroid injections  Past Surgery: none  Morgan Stevens has symptoms of cervical myelopathy.  The symptoms are causing a significant impact on the patient's life.   I have utilized the care everywhere function in epic to review the outside records available from external health systems.  Telemedicine visit with Cooper Render, PA-C on 02/13/22: I spoke with [name] and discussed her MRI results. She does have neuroforaminal stenosis on the right that may explain her intrascapular and right radiating arm pain however she just underwent shoulder surgery about a week ago and recovering from this. She is currently in physical therapy but this for her shoulder. We discussed further treatment of her cervical symptoms however she needs to be cleared from orthopedics to move forward with this. She will contact us once she has finished her rehab for her shoulder and has been cleared by orthopedics we will move forward with physical therapy for her neck. She was encouraged to call our office in the meantime with any questions or concerns. We will otherwise see her going forward on an as-needed basis.  She expressed understanding was in agreement with this plan.   Office visit with Cooper Render, PA-C on 01/04/22: Morgan Stevens is a 57 y.o. female who presents with the chief complaint of recurrent and worsening interscapular pain. She also endorses occasional tingling down her right arm into all 5 fingers. She does have similar symptoms in the left arm however she has always attributed this to her shoulder pathology. She denies any radiating arm pain, difficulty with dexterity, or gait disturbance. She is not currently taking medications for neck pain and has not recently undergone any injections or physical therapy.  Office visit with Dr. Izora Ribas 12/08/20: [name] is here today with a chief complaint of right facial numbness and tingling in right arm and hand. This has recently resolved. She was thought to have transient ischemic attack. She was found to have some cervical disc disease, so was referred for follow-up. She is currently having no symptoms.    Review of Systems:  A 10 point review of systems is negative, except for the pertinent positives and negatives detailed in the HPI.  Past Medical History: Past Medical History:  Diagnosis Date   Chronic headaches    several per week   Gastritis    GERD (gastroesophageal reflux disease)    Hiatal hernia    Hyperlipidemia    Hypertension    Obesity (BMI 30-39.9)    Reflux    Spinal stenosis    Thyroid disease    Vertigo    rare   Wears contact lenses     Past Surgical History: Past Surgical History:  Procedure  Laterality Date   CARDIAC CATHETERIZATION  02/27/2016   Procedure: Left Heart Cath and Coronary Angiography;  Surgeon: Corky Crafts, MD;  Location: [facility];  Service: Cardiovascular;;   CESAREAN SECTION     CHOLECYSTECTOMY     COLONOSCOPY WITH PROPOFOL N/A 04/15/2019   Procedure: COLONOSCOPY WITH PROPOFOL;  Surgeon: Pasty Spillers, MD;  Location: [facility] ENDOSCOPY;  Service: Endoscopy;   Laterality: N/A;   ESOPHAGOGASTRODUODENOSCOPY (EGD) WITH PROPOFOL N/A 04/15/2019   Procedure: ESOPHAGOGASTRODUODENOSCOPY (EGD) WITH PROPOFOL;  Surgeon: Pasty Spillers, MD;  Location: [facility] ENDOSCOPY;  Service: Endoscopy;  Laterality: N/A;   SHOULDER ARTHROSCOPY WITH DEBRIDEMENT AND BICEP TENDON REPAIR Left 02/06/2022   Procedure: Left shoulder arthroscopic debridement, subacromial decompression, open subpectoral biceps tenodesis, and  Regeneten Patch application;  Surgeon: Signa Kell, MD;  Location: [facility];  Service: Orthopedics;  Laterality: Left;   THYROIDECTOMY     THYROIDECTOMY      Allergies: Allergies as of 09/25/2022   (No Known Allergies)    Medications: No outpatient medications have been marked as taking for the 09/25/22 encounter (Office Visit) with Venetia Night, MD.    Social History: Social History   Tobacco Use   Smoking status: Never   Smokeless tobacco: Never  Vaping Use   Vaping Use: Never used  Substance Use Topics   Alcohol use: No    Alcohol/week: 0.0 standard drinks of alcohol   Drug use: No    Family Medical History: Family History  Problem Relation Age of Onset   Congestive Heart Failure Mother    Breast cancer Neg Hx     Physical Examination: Vitals:   09/25/22 1009  BP: (!) 148/94  Pulse: 69    General: Patient is well developed, well nourished, calm, collected, and in no apparent distress. Attention to examination is appropriate.  Neck:   Supple.  Full range of motion.  Respiratory: Patient is breathing without any difficulty.   NEUROLOGICAL:     Awake, alert, oriented to person, place, and time.  Speech is clear and fluent.   Cranial Nerves: Pupils equal round and reactive to light.  Facial tone is symmetric.  Facial sensation is symmetric. Shoulder shrug is symmetric. Tongue protrusion is midline.  There is no pronator drift.  ROM of spine: full.    Strength: Side Biceps Triceps Deltoid Interossei Grip  Wrist Ext. Wrist Flex.  R 5 5 5 5 5 5 5   L 5 5 4+ 4+ 4+ 5 5   Side Iliopsoas Quads Hamstring PF DF EHL  R 5 5 5 5 5 5   L 5 5 5 5 5 5    Reflexes are 2+ and symmetric at the biceps, triceps, brachioradialis, patella and achilles.   Hoffman's is present.   Bilateral upper and lower extremity sensation is intact to light touch.    No evidence of dysmetria noted.  Gait is wide-based.  She has moderate difficulty with tandem gait.     Medical Decision Making  Imaging: MRI C spine 09/09/2022 IMPRESSION: 1. No significant interval change in multilevel cervical spondylosis, as detailed above. 2. Moderate-severe canal stenosis at C5-C6 and moderate canal stenosis at C6-C7. 3. Mild canal stenosis at C3-C4 and C4-C5. 4. Mild left foraminal stenosis at C5-C6. Mild right foraminal stenosis at C4-C5 and C6-C7.     Electronically Signed   By: D.O.   On: 09/09/2022 16:01  I have personally reviewed the images and agree with the above interpretation.  Assessment and  Plan: Ms. Donnelly is a pleasant 57 y.o. 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  She has abnormal balance and abnormal reflexes.  She has had progressive symptoms over the past 2 years.  At this point, I recommended surgical intervention.  I do not think that conservative management is indicated, as it has not been shown to help with cervical myelopathy.  I recommended C5-7 anterior cervical discectomy and fusion.  I discussed the planned procedure at length with the patient, including the risks, benefits, alternatives, and indications. The risks discussed include but are not limited to bleeding, infection, need for reoperation, spinal fluid leak, stroke, vision loss, anesthetic complication, coma, paralysis, and even death. We also discussed the possibility of post-operative dysphagia, vocal cord paralysis, and the risk of adjacent segment disease in the future. I also described in  detail that improvement was not guaranteed.  The patient expressed understanding of these risks, and asked that we proceed with surgery. I described the surgery in layman's terms, and gave ample opportunity for questions, which were answered to the best of my ability.     Thank you for involving me in the care of this patient.      Donnavin Vandenbrink K. Izora Ribas MD, [facility]

## 2022-09-25 ENCOUNTER — Encounter: Payer: Self-pay | Admitting: Neurosurgery

## 2022-09-25 ENCOUNTER — Ambulatory Visit: Payer: Medicaid Other | Admitting: Neurosurgery

## 2022-09-25 VITALS — BP 148/94 | HR 69 | Ht 64.0 in | Wt 237.0 lb

## 2022-09-25 DIAGNOSIS — M4802 Spinal stenosis, cervical region: Secondary | ICD-10-CM

## 2022-09-25 DIAGNOSIS — G959 Disease of spinal cord, unspecified: Secondary | ICD-10-CM | POA: Diagnosis not present

## 2022-09-25 NOTE — Progress Notes (Signed)
Heart squeeze in 60-65%, no wall motion abnormalities or valvular abnormalities, overall reassuring study

## 2022-09-25 NOTE — Patient Instructions (Signed)
Please see below for information in regards to your upcoming surgery:  Planned surgery: C5-7 anterior cervical discectomy and fusion   Surgery date: 11/14/22 - you will find out your arrival time the business day before your surgery.   Pre-op appointment at [facility]: we will call you with a date/time for this. Pre-admit testing is located on the first floor of the [facility], [place], [address]. Please bring all prescriptions in the original prescription bottles to your appointment, even if you have reviewed medications by phone with a pharmacy representative. Pre-op labs may be done at your pre-op appointment. You are not required to fast for these labs. Should you need to change your pre-op appointment, please call Pre-admit testing at [phone].    Surgical clearance: we will send a clearance form to Dr Parks Ranger     Blood thinners: stop aspirin 7 days prior, resume aspirin 14 days after    NSAIDS (Non-steroidal anti-inflammatory drugs): because you are having a fusion, no NSAIDS (such as ibuprofen, aleve, naproxen, meloxicam, diclofenac) for 3 months after surgery. Celebrex is an exception. Tylenol is ok because it is not an NSAID.   Because you are having a fusion: for appointments after your 2 week follow-up: please arrive at the [facility] outpatient imaging center ([place], [place]) or [facility] one hour prior to your appointment for x-rays. This applies to every appointment after your 2 week follow-up. Failure to do so may result in your appointment being rescheduled.   If you have FMLA/disability paperwork, please drop it off or fax it to [phone], attention [place].   We can be reached by phone or mychart 8am-4pm, Monday-Friday. If you have any questions/concerns before or after surgery, you can reach [place] at [phone], or you can send a mychart message. If you have a  concern after hours that cannot wait until normal business hours, you can call [phone] and ask to page the neurosurgeon on call for [place].     Appointments/FMLA & disability paperwork: [place] Nurse: Ophelia Shoulder  Medical assistant: Raquel Sarna Physician Assistant's: Cooper Render & Geronimo Boot Surgeon: Meade Maw, MD

## 2022-10-03 ENCOUNTER — Encounter: Payer: Self-pay | Admitting: Family Medicine

## 2022-10-03 ENCOUNTER — Ambulatory Visit: Payer: Medicaid Other | Admitting: Family Medicine

## 2022-10-03 VITALS — BP 136/84 | HR 88 | Temp 96.8°F | Wt 239.0 lb

## 2022-10-03 DIAGNOSIS — M5030 Other cervical disc degeneration, unspecified cervical region: Secondary | ICD-10-CM | POA: Diagnosis not present

## 2022-10-03 DIAGNOSIS — M79622 Pain in left upper arm: Secondary | ICD-10-CM

## 2022-10-03 DIAGNOSIS — K200 Eosinophilic esophagitis: Secondary | ICD-10-CM

## 2022-10-03 DIAGNOSIS — J029 Acute pharyngitis, unspecified: Secondary | ICD-10-CM | POA: Diagnosis not present

## 2022-10-03 DIAGNOSIS — R09A2 Foreign body sensation, throat: Secondary | ICD-10-CM | POA: Diagnosis not present

## 2022-10-03 MED ORDER — GABAPENTIN 300 MG PO CAPS: 300.0000 mg | ORAL_CAPSULE | Freq: Two times a day (BID) | ORAL | 2 refills | Status: DC

## 2022-10-03 NOTE — Progress Notes (Unsigned)
Subjective:    Patient ID: Morgan Stevens, female    DOB: 09-Sep-1965, 57 y.o.   MRN: [id number]  Morgan Stevens is a 57 y.o. female presenting on 10/03/2022 for No chief complaint on file.   HPI  Sore Throat / Ear Pain 3 weeks of upper GI symptoms GERD, sore throat, both ear pain as well. Admits some headache Tylenol temporary relief. Does not take NSAIDs Denies any sinus drainage or post nasal drip  GERD Dysphagia Schatzki Ring She was seen [facility] 08/28/22 They agreed with doubling Pantoprazole to 40mg  TWICE A DAY and added Famotidine Still taking Pepto AS NEEDED Scheduled for 11/05/22  Cervical myelopathy, stenosis Established with [facility] last visit 09/25/22, and scheduled for Cervical spinal surgery March 2024  Additionally with L axillary pain - referred from gastric / chest Improved on Gabapentin  Denies fevers chills body aches       10/03/2022    3:10 PM 08/07/2022    1:59 PM 11/22/2021    2:49 PM  Depression screen PHQ 2/9  Decreased Interest 2 1 1   Down, Depressed, Hopeless 3 1 1   PHQ - 2 Score 5 2 2   Altered sleeping 2 1 0  Tired, decreased energy 3 3 1   Change in appetite 1 3 0  Feeling bad or failure about yourself  2 1 1   Trouble concentrating 1 1 1   Moving slowly or fidgety/restless 0 0 0  Suicidal thoughts 0 0 0  PHQ-9 Score 14 11 5   Difficult doing work/chores Very difficult Somewhat difficult Somewhat difficult    Social History   Tobacco Use   Smoking status: Never   Smokeless tobacco: Never  Vaping Use   Vaping Use: Never used  Substance Use Topics   Alcohol use: No    Alcohol/week: 0.0 standard drinks of alcohol   Drug use: No    Review of Systems Per HPI unless specifically indicated above     Objective:    BP 136/84 (BP Location: Left Arm, Patient Position: Sitting, Cuff Size: Large)   Pulse 88   Temp (!) 96.8 F (36 C) (Temporal)   Wt 239 lb (108.4 kg)   LMP 07/11/2020   SpO2 99%   BMI 41.02 kg/m   Wt Readings from  Last 3 Encounters:  10/03/22 239 lb (108.4 kg)  09/25/22 237 lb (107.5 kg)  09/09/22 238 lb (108 kg)    Physical Exam Vitals and nursing note reviewed.  Constitutional:      General: She is not in acute distress.    Appearance: Normal appearance. She is well-developed. She is not diaphoretic.     Comments: Well-appearing, comfortable, cooperative  HENT:     Head: Normocephalic and atraumatic.     Right Ear: Tympanic membrane, ear canal and external ear normal. There is no impacted cerumen.     Left Ear: Tympanic membrane, ear canal and external ear normal. There is no impacted cerumen.     Mouth/Throat:     Mouth: Mucous membranes are moist.     Pharynx: Oropharynx is clear. No oropharyngeal exudate or posterior oropharyngeal erythema.  Eyes:     General:        Right eye: No discharge.        Left eye: No discharge.     Conjunctiva/sclera: Conjunctivae normal.  Cardiovascular:     Rate and Rhythm: Normal rate.  Pulmonary:     Effort: Pulmonary effort is normal.  Skin:    General: Skin is warm  and dry.     Findings: No erythema or rash.  Neurological:     Mental Status: She is alert and oriented to person, place, and time.  Psychiatric:        Mood and Affect: Mood normal.        Behavior: Behavior normal.        Thought Content: Thought content normal.     Comments: Well groomed, good eye contact, normal speech and thoughts    Results for orders placed or performed in visit on 09/20/22  ECHOCARDIOGRAM COMPLETE  Result Value Ref Range   AR max vel 2.56 cm2   AV Peak grad 6.7 mmHg   Ao pk vel 1.29 m/s   S' Lateral 2.80 cm   Area-P 1/2 4.29 cm2   AV Area VTI 2.75 cm2   AV Mean grad 4.0 mmHg   Single Plane A4C EF 53.2 %   Single Plane A2C EF 52.2 %   Calc EF 51.8 %   AV Area mean vel 2.59 cm2   Est EF 60 - 65%       Assessment & Plan:   Problem List Items Addressed This Visit   None Visit Diagnoses     Eosinophilic esophagitis    -  Primary   Sore throat        Globus sensation       Axillary pain, left       Relevant Medications   gabapentin (NEURONTIN) 300 MG capsule   DDD (degenerative disc disease), cervical       Relevant Medications   gabapentin (NEURONTIN) 300 MG capsule       Reassurance today No sign of sinus or ear infection Throat overall looks fine. Some slight enlarged tonsils but no sign of tonsil infection Likely related to the gastric symptoms and esophageal symptoms with radiating pain pattern into axillary and into neck/ears  Inc gabapentin to 300mg  new rx twice a day, can adjust further up if needed for managing this radiating pain.  Keep up with upcoming GI apt EGD in March with further management  Meds ordered this encounter  Medications   gabapentin (NEURONTIN) 300 MG capsule    Sig: Take 1 capsule (300 mg total) by mouth 2 (two) times daily.    Dispense:  60 capsule    Refill:  2    Dose adjusted      Follow up plan: Return if symptoms worsen or fail to improve.   Nobie Putnam, [facility] 10/03/2022, 3:30 PM

## 2022-10-03 NOTE — Patient Instructions (Addendum)
Thank you for coming to the office today.  No sign of sinus or ear infection Small to moderate ear wax that is not obstructing, not in the way, ear drums look fine Throat overall looks fine. Some slight enlarged tonsils but no sign of tonsil infection Likely related to the gastric symptoms and esophageal symptoms with radiating pain pattern  Additionally the gastric symptoms are likely radiating through the under arm.  Inc gabapentin to 300mg  new rx twice a day, can adjust further up if needed  Please schedule a Follow-up Appointment to: Return if symptoms worsen or fail to improve.  If you have any other questions or concerns, please feel free to call the office or send a message through [place]. You may also schedule an earlier appointment if necessary.  Additionally, you may be receiving a survey about your experience at our office within a few days to 1 week by e-mail or mail. We value your feedback.  Nobie Putnam, DO [place]

## 2022-10-05 ENCOUNTER — Encounter: Payer: Self-pay | Admitting: Family Medicine

## 2022-10-05 ENCOUNTER — Other Ambulatory Visit: Payer: Self-pay

## 2022-10-05 DIAGNOSIS — Z01818 Encounter for other preprocedural examination: Secondary | ICD-10-CM

## 2022-10-19 ENCOUNTER — Ambulatory Visit: Payer: Medicaid Other | Admitting: Family Medicine

## 2022-10-19 ENCOUNTER — Encounter: Payer: Self-pay | Admitting: Family Medicine

## 2022-10-19 VITALS — BP 122/80 | HR 77 | Ht 64.0 in | Wt 240.0 lb

## 2022-10-19 DIAGNOSIS — M5030 Other cervical disc degeneration, unspecified cervical region: Secondary | ICD-10-CM | POA: Diagnosis not present

## 2022-10-19 DIAGNOSIS — Z01818 Encounter for other preprocedural examination: Secondary | ICD-10-CM | POA: Diagnosis not present

## 2022-10-19 DIAGNOSIS — R7303 Prediabetes: Secondary | ICD-10-CM

## 2022-10-19 LAB — POCT GLYCOSYLATED HEMOGLOBIN (HGB A1C): Hemoglobin A1C: 5.9 % — AB (ref 4.0–5.6)

## 2022-10-19 NOTE — Patient Instructions (Addendum)
Thank you for coming to the office today.  Pre Op eval today Cleared to proceed with surgery  A1c test will be on mychart later today.  X-ray reviewed from 06/2022  ECHO reviewed. It is normal.  EKG from January  Labs from January as well.  Please schedule a Follow-up Appointment to: Return if symptoms worsen or fail to improve.  If you have any other questions or concerns, please feel free to call the office or send a message through [place]. You may also schedule an earlier appointment if necessary.  Additionally, you may be receiving a survey about your experience at our office within a few days to 1 week by e-mail or mail. We value your feedback.  Nobie Putnam, DO [place]

## 2022-10-19 NOTE — Progress Notes (Signed)
Subjective:    Patient ID: Morgan Stevens, female    DOB: 11/27/1965, 57 y.o.   MRN: [id number]  Morgan Stevens is a 57 y.o. female presenting on 10/19/2022 for Pre-op Exam   HPI  [place]  Anticipated upcoming surgery - C5-C7 Anterior Cervical Discectomy and Fusion, by Dr Izora Ribas (at [facility]) in approximately 1 month, on 11/14/22  Cervical myelopathy, stenosis Established with [facility] last visit 09/25/22, and scheduled for Cervical spinal surgery March 2024  Regarding surgical and anesthesia history: - Known history of several major and minor surgeries in past, and has tolerated general anesthesia well without problem, complication or allergy. No family history of problem with anesthesia - Able to tolerate regular exercise up to >4 METs, walking up flight of stairs - No known history of cardiovascular disease. Never had MI or known CAD. - No known history of pulmonary disease. Never smoked. Never diagnosed with COPD, Asthma, OSA. She has had some allergies triggering occasional coughing bronchospasm but no diagnosis of asthma. - Denies exertional symptoms of chest pain or tightness, dyspnea, coughing, apnea, syncopal episodes, palpitations   Past Surgical History:  Procedure Laterality Date   CARDIAC CATHETERIZATION  02/27/2016   Procedure: Left Heart Cath and Coronary Angiography;  Surgeon: Jettie Booze, MD;  Location: [place] CV [facility];  Service: Cardiovascular;;   CESAREAN SECTION     CHOLECYSTECTOMY     COLONOSCOPY WITH PROPOFOL N/A 04/15/2019   Procedure: COLONOSCOPY WITH PROPOFOL;  Surgeon: Virgel Manifold, MD;  Location: [facility];  Service: Endoscopy;  Laterality: N/A;   ESOPHAGOGASTRODUODENOSCOPY (EGD) WITH PROPOFOL N/A 04/15/2019   Procedure: ESOPHAGOGASTRODUODENOSCOPY (EGD) WITH PROPOFOL;  Surgeon: Virgel Manifold, MD;  Location: [facility];  Service: Endoscopy;  Laterality: N/A;   SHOULDER ARTHROSCOPY WITH  DEBRIDEMENT AND BICEP TENDON REPAIR Left 02/06/2022   Procedure: Left shoulder arthroscopic debridement, subacromial decompression, open subpectoral biceps tenodesis, and  Regeneten Patch application;  Surgeon: Leim Fabry, MD;  Location: [place];  Service: Orthopedics;  Laterality: Left;   THYROIDECTOMY     THYROIDECTOMY          10/19/2022    2:23 PM 10/03/2022    3:10 PM 08/07/2022    1:59 PM  Depression screen PHQ 2/9  Decreased Interest '2 2 1  '$ Down, Depressed, Hopeless '2 3 1  '$ PHQ - 2 Score '4 5 2  '$ Altered sleeping '1 2 1  '$ Tired, decreased energy '3 3 3  '$ Change in appetite '2 1 3  '$ Feeling bad or failure about yourself  '2 2 1  '$ Trouble concentrating '2 1 1  '$ Moving slowly or fidgety/restless 0 0 0  Suicidal thoughts 0 0 0  PHQ-9 Score '14 14 11  '$ Difficult doing work/chores Very difficult Very difficult Somewhat difficult    Social History   Tobacco Use   Smoking status: Never   Smokeless tobacco: Never  Vaping Use   Vaping Use: Never used  Substance Use Topics   Alcohol use: No    Alcohol/week: 0.0 standard drinks of alcohol   Drug use: No    Review of Systems Per HPI unless specifically indicated above     Objective:    BP 122/80   Pulse 77   Ht '5\' 4"'$  (1.626 m)   Wt 240 lb (108.9 kg)   LMP 07/11/2020   SpO2 99%   BMI 41.20 kg/m   Wt Readings from Last 3 Encounters:  10/19/22 240 lb (108.9 kg)  10/03/22 239 lb (108.4 kg)  09/25/22 237 lb (107.5 kg)    Physical Exam Vitals and nursing note reviewed.  Constitutional:      General: She is not in acute distress.    Appearance: She is well-developed. She is not diaphoretic.     Comments: Well-appearing, comfortable, cooperative  HENT:     Head: Normocephalic and atraumatic.  Eyes:     General:        Right eye: No discharge.        Left eye: No discharge.     Conjunctiva/sclera: Conjunctivae normal.  Neck:     Thyroid: No thyromegaly.  Cardiovascular:     Rate and Rhythm: Normal rate and  regular rhythm.     Heart sounds: Normal heart sounds. No murmur heard. Pulmonary:     Effort: Pulmonary effort is normal. No respiratory distress.     Breath sounds: Normal breath sounds. No wheezing or rales.  Musculoskeletal:        General: Normal range of motion.     Cervical back: Normal range of motion and neck supple.  Lymphadenopathy:     Cervical: No cervical adenopathy.  Skin:    General: Skin is warm and dry.     Findings: No erythema or rash.  Neurological:     Mental Status: She is alert and oriented to person, place, and time.  Psychiatric:        Behavior: Behavior normal.     Comments: Well groomed, good eye contact, normal speech and thoughts      I have personally reviewed the radiology report from 06/29/22.  Narrative & Impression  CLINICAL DATA:  Left-sided chest pain.   EXAM: CHEST - 2 VIEW   COMPARISON:  03/19/2022   FINDINGS: The cardiomediastinal contours are normal. The lungs are clear. Pulmonary vasculature is normal. No consolidation, pleural effusion, or pneumothorax. No acute osseous abnormalities are seen.   IMPRESSION: Negative radiographs of the chest.     Electronically Signed   By: Keith Rake M.D.   On: 06/29/2022 23:21     ECHOCARDIOGRAM REPORT       Patient Name:   Morgan Stevens Date of Exam: 09/20/2022  Medical Rec #:  [id number]    Height:       64.0 in  Accession #:    [id number]   Weight:       238.0 lb  Date of Birth:  1966/07/14    BSA:          2.106 m  Patient Age:    89 years     BP:           128/74 mmHg  Patient Gender: F            HR:           70 bpm.  Exam Location:  [place]   Procedure: 2D Echo, 3D Echo, Cardiac Doppler, Color Doppler and Strain  Analysis   Indications:   R06.02 SOB    History:        Patient has prior history of Echocardiogram examinations,  most                 recent 12/06/2019. Signs/Symptoms:Shortness of Breath; Risk                  Factors:Non-Smoker.    Sonographer:     Pilar Jarvis RDMS, RVT, RDCS  Referring Phys: [id number] SHERI HAMMOCK   IMPRESSIONS     1. Left ventricular ejection fraction, by estimation,  is 60 to 65%. The  left ventricle has normal function. The left ventricle has no regional  wall motion abnormalities. Left ventricular diastolic parameters are  consistent with Grade I diastolic  dysfunction (impaired relaxation). The average left ventricular global  longitudinal strain is -20.1 %. The global longitudinal strain is normal.   2. Right ventricular systolic function is normal. The right ventricular  size is normal. There is normal pulmonary artery systolic pressure. The  estimated right ventricular systolic pressure is [id number] mmHg.   3. The mitral valve is normal in structure. No evidence of mitral valve  regurgitation. No evidence of mitral stenosis.   4. The aortic valve is normal in structure. Aortic valve regurgitation is  not visualized. No aortic stenosis is present.   5. The inferior vena cava is normal in size with greater than 50%  respiratory variability, suggesting right atrial pressure of 3 mmHg.   FINDINGS   Left Ventricle: Left ventricular ejection fraction, by estimation, is 60  to 65%. The left ventricle has normal function. The left ventricle has no  regional wall motion abnormalities. The average left ventricular global  longitudinal strain is -20.1 %.  The global longitudinal strain is normal. The left ventricular internal  cavity size was normal in size. There is no left ventricular hypertrophy.  Left ventricular diastolic parameters are consistent with Grade I  diastolic dysfunction (impaired  relaxation).   Right Ventricle: The right ventricular size is normal. No increase in  right ventricular wall thickness. Right ventricular systolic function is  normal. There is normal pulmonary artery systolic pressure. The tricuspid  regurgitant velocity is 2.38 m/s, and   with an assumed right atrial pressure of 5  mmHg, the estimated right  ventricular systolic pressure is [id number] mmHg.   Left Atrium: Left atrial size was normal in size.   Right Atrium: Right atrial size was normal in size.   Pericardium: There is no evidence of pericardial effusion.   Mitral Valve: The mitral valve is normal in structure. There is mild  calcification of the mitral valve leaflet(s). No evidence of mitral valve  regurgitation. No evidence of mitral valve stenosis.   Tricuspid Valve: The tricuspid valve is normal in structure. Tricuspid  valve regurgitation is mild . No evidence of tricuspid stenosis.   Aortic Valve: The aortic valve is normal in structure. Aortic valve  regurgitation is not visualized. No aortic stenosis is present. Aortic  valve mean gradient measures 4.0 mmHg. Aortic valve peak gradient measures  6.7 mmHg. Aortic valve area, by VTI  measures 2.75 cm.   Pulmonic Valve: The pulmonic valve was normal in structure. Pulmonic valve  regurgitation is not visualized. No evidence of pulmonic stenosis.   Aorta: The aortic root is normal in size and structure.   Venous: The inferior vena cava is normal in size with greater than 50%  respiratory variability, suggesting right atrial pressure of 3 mmHg.   IAS/Shunts: No atrial level shunt detected by color flow Doppler.     LEFT VENTRICLE  PLAX 2D  LVIDd:         4.10 cm     Diastology  LVIDs:         2.80 cm     LV e' medial:    7.51 cm/s  LV PW:         0.90 cm     LV E/e' medial:  9.5  LV IVS:        0.60 cm  LV e' lateral:   8.38 cm/s  LVOT diam:     2.00 cm     LV E/e' lateral: 8.5  LV SV:         79  LV SV Index:   38          2D Longitudinal Strain  LVOT Area:     3.14 cm    2D Strain GLS Avg:     -20.1 %    LV Volumes (MOD)  LV vol d, MOD A2C: 72.2 ml 3D Volume EF:  LV vol d, MOD A4C: 81.9 ml 3D EF:        56 %  LV vol s, MOD A2C: 34.5 ml LV EDV:       114 ml  LV vol s, MOD A4C: 38.3 ml LV ESV:       51 ml  LV SV MOD A2C:     37.7  ml LV SV:        64 ml  LV SV MOD A4C:     81.9 ml  LV SV MOD BP:      40.7 ml   RIGHT VENTRICLE             IVC  RV Basal diam:  3.60 cm     IVC diam: 1.30 cm  RV S prime:     12.30 cm/s  TAPSE (M-mode): 2.7 cm   LEFT ATRIUM             Index        RIGHT ATRIUM           Index  LA diam:        3.80 cm 1.80 cm/m   RA Area:     12.80 cm  LA Vol (A2C):   45.7 ml 21.69 ml/m  RA Volume:   25.90 ml  12.30 ml/m  LA Vol (A4C):   33.6 ml 15.95 ml/m  LA Biplane Vol: 40.8 ml 19.37 ml/m   AORTIC VALVE                    PULMONIC VALVE  AV Area (Vmax):    2.56 cm     PV Vmax:       1.07 m/s  AV Area (Vmean):   2.59 cm     PV Peak grad:  4.6 mmHg  AV Area (VTI):     2.75 cm  AV Vmax:           129.00 cm/s  AV Vmean:          91.600 cm/s  AV VTI:            0.288 m  AV Peak Grad:      6.7 mmHg  AV Mean Grad:      4.0 mmHg  LVOT Vmax:         105.00 cm/s  LVOT Vmean:        75.600 cm/s  LVOT VTI:          0.252 m  LVOT/AV VTI ratio: 0.87    AORTA  Ao Root diam: 2.80 cm  Ao Asc diam:  3.00 cm   MITRAL VALVE               TRICUSPID VALVE  MV Area (PHT): 4.29 cm    TR Peak grad:   22.7 mmHg  MV Decel Time: 177 msec    TR Vmax:        238.00  cm/s  MV E velocity: 71.10 cm/s  MV A velocity: 78.40 cm/s  SHUNTS  MV E/A ratio:  0.91        Systemic VTI:  0.25 m                             Systemic Diam: 2.00 cm   Ida Rogue MD  Electronically signed by Ida Rogue MD  Signature Date/Time: 09/21/2022/4:18:50 PM        Final     Results for orders placed or performed in visit on 10/19/22  POCT glycosylated hemoglobin (Hb A1C)  Result Value Ref Range   Hemoglobin A1C 5.9 (A) 4.0 - 5.6 %      Assessment & Plan:   Problem List Items Addressed This Visit   None Visit Diagnoses     Pre-op examination    -  Primary   DDD (degenerative disc disease), cervical       Pre-diabetes       Relevant Orders   POCT glycosylated hemoglobin (Hb A1C) (Completed)       Pre-op  clearance for non-cardiac surgery today, anterior C5-C7 cervical discectomy fusion (Low risk), general anesthesia - Previously tolerated prior surgeries with general anesthesia - No known cardiac hx. Appropriate functional status >4 METs - Never smoker  Plan: 1. Cleared for elective surgery. Completed provided pre-op paperwork per [facility] office, will fax on Monday 10/22/22 2. Labs reviewed from January 2024. Mild K 3.4, previously 3-4 range. She has increased dietary BY MOUTH intake. Creatinine is normal range. CBC shows Hgb 13.0 3. No repeat EKG today. Last done 08/2022 reviewed. 4. BP is controlled 5. A1c 5.9 (today). Pre Diabetes range. Not limit surgery. 6. Chest X-ray reviewed from 06/2022 7. ECHO reviewed, normal   No orders of the defined types were placed in this encounter.     Follow up plan: Return if symptoms worsen or fail to improve.  Nobie Putnam, DO [facility] 10/19/2022, 2:39 PM

## 2022-10-22 ENCOUNTER — Encounter: Payer: Self-pay | Admitting: Family Medicine

## 2022-10-22 DIAGNOSIS — K2289 Other specified disease of esophagus: Secondary | ICD-10-CM

## 2022-10-22 DIAGNOSIS — K200 Eosinophilic esophagitis: Secondary | ICD-10-CM

## 2022-10-22 DIAGNOSIS — K219 Gastro-esophageal reflux disease without esophagitis: Secondary | ICD-10-CM

## 2022-10-23 MED ORDER — PANTOPRAZOLE SODIUM 40 MG PO TBEC: 40.0000 mg | DELAYED_RELEASE_TABLET | Freq: Two times a day (BID) | ORAL | 3 refills | Status: DC

## 2022-10-25 ENCOUNTER — Encounter: Payer: Self-pay | Admitting: Radiology

## 2022-11-01 ENCOUNTER — Other Ambulatory Visit: Payer: Medicaid Other

## 2022-11-02 ENCOUNTER — Encounter: Payer: Self-pay | Admitting: Gastroenterology

## 2022-11-05 ENCOUNTER — Ambulatory Visit: Payer: Medicaid Other | Admitting: Anesthesiology

## 2022-11-05 ENCOUNTER — Ambulatory Visit
Admission: RE | Admit: 2022-11-05 | Discharge: 2022-11-05 | Disposition: A | Discharge status: Home / Self Care | Payer: Medicaid Other | Source: Ambulatory Visit | Attending: Gastroenterology | Admitting: Gastroenterology

## 2022-11-05 ENCOUNTER — Other Ambulatory Visit: Payer: Self-pay

## 2022-11-05 ENCOUNTER — Inpatient Hospital Stay: Admission: RE | Admit: 2022-11-05 | Payer: Medicaid Other | Source: Ambulatory Visit

## 2022-11-05 ENCOUNTER — Encounter
Admission: RE | Disposition: A | Discharge status: Home / Self Care | Payer: Self-pay | Source: Ambulatory Visit | Attending: Gastroenterology

## 2022-11-05 ENCOUNTER — Encounter: Payer: Self-pay | Admitting: Gastroenterology

## 2022-11-05 DIAGNOSIS — Q399 Congenital malformation of esophagus, unspecified: Secondary | ICD-10-CM | POA: Insufficient documentation

## 2022-11-05 DIAGNOSIS — I10 Essential (primary) hypertension: Secondary | ICD-10-CM | POA: Insufficient documentation

## 2022-11-05 DIAGNOSIS — R1310 Dysphagia, unspecified: Secondary | ICD-10-CM | POA: Diagnosis not present

## 2022-11-05 DIAGNOSIS — K224 Dyskinesia of esophagus: Secondary | ICD-10-CM | POA: Diagnosis not present

## 2022-11-05 DIAGNOSIS — Z6841 Body Mass Index (BMI) 40.0 and over, adult: Secondary | ICD-10-CM | POA: Insufficient documentation

## 2022-11-05 DIAGNOSIS — E039 Hypothyroidism, unspecified: Secondary | ICD-10-CM | POA: Diagnosis not present

## 2022-11-05 DIAGNOSIS — R519 Headache, unspecified: Secondary | ICD-10-CM | POA: Diagnosis not present

## 2022-11-05 DIAGNOSIS — E785 Hyperlipidemia, unspecified: Secondary | ICD-10-CM | POA: Insufficient documentation

## 2022-11-05 DIAGNOSIS — K222 Esophageal obstruction: Secondary | ICD-10-CM | POA: Insufficient documentation

## 2022-11-05 DIAGNOSIS — K449 Diaphragmatic hernia without obstruction or gangrene: Secondary | ICD-10-CM | POA: Diagnosis not present

## 2022-11-05 DIAGNOSIS — K2289 Other specified disease of esophagus: Secondary | ICD-10-CM | POA: Diagnosis not present

## 2022-11-05 DIAGNOSIS — K2970 Gastritis, unspecified, without bleeding: Secondary | ICD-10-CM | POA: Diagnosis not present

## 2022-11-05 DIAGNOSIS — Z79899 Other long term (current) drug therapy: Secondary | ICD-10-CM | POA: Diagnosis not present

## 2022-11-05 DIAGNOSIS — K219 Gastro-esophageal reflux disease without esophagitis: Secondary | ICD-10-CM | POA: Diagnosis not present

## 2022-11-05 DIAGNOSIS — K2960 Other gastritis without bleeding: Secondary | ICD-10-CM | POA: Insufficient documentation

## 2022-11-05 HISTORY — PX: ESOPHAGOGASTRODUODENOSCOPY: SHX5428

## 2022-11-05 SURGERY — EGD (ESOPHAGOGASTRODUODENOSCOPY)
Anesthesia: General

## 2022-11-05 MED ORDER — PROPOFOL 10 MG/ML IV BOLUS
INTRAVENOUS | Status: AC
  Filled 2022-11-05: qty 20

## 2022-11-05 MED ORDER — SODIUM CHLORIDE 0.9 % IV SOLN: INTRAVENOUS | Status: DC

## 2022-11-05 MED ORDER — PROPOFOL 10 MG/ML IV BOLUS
INTRAVENOUS | Status: DC | PRN
  Administered 2022-11-05: 50 mg via INTRAVENOUS
  Administered 2022-11-05: 120 mg via INTRAVENOUS
  Administered 2022-11-05: 30 mg via INTRAVENOUS
  Administered 2022-11-05: 50 mg via INTRAVENOUS

## 2022-11-05 MED ORDER — LIDOCAINE HCL (CARDIAC) PF 100 MG/5ML IV SOSY
PREFILLED_SYRINGE | INTRAVENOUS | Status: DC | PRN
  Administered 2022-11-05: 40 mg via INTRAVENOUS

## 2022-11-05 NOTE — Interval H&P Note (Signed)
History and Physical Interval Note: Preprocedure H&P from 11/05/22  was reviewed and there was no interval change after seeing and examining the patient.  Written consent was obtained from the patient after discussion of risks, benefits, and alternatives. Patient has consented to proceed with Esophagogastroduodenoscopy with possible intervention   11/05/2022 11:06 AM  Dennard Schaumann  has presented today for surgery, with the diagnosis of Dysphagia, unspecified type (R13.10) Schatzki's ring (K22.2) Gastroesophageal reflux disease with hiatal hernia (K21.9,K44.9).  The various methods of treatment have been discussed with the patient and family. After consideration of risks, benefits and other options for treatment, the patient has consented to  Procedure(s): ESOPHAGOGASTRODUODENOSCOPY (EGD) (N/A) as a surgical intervention.  The patient's history has been reviewed, patient examined, no change in status, stable for surgery.  I have reviewed the patient's chart and labs.  Questions were answered to the patient's satisfaction.     Annamaria Helling

## 2022-11-05 NOTE — Anesthesia Procedure Notes (Addendum)
Procedure Name: General with mask airway Date/Time: 11/05/2022 11:11 AM  Performed by: Hilbert Odor, CRNAPre-anesthesia Checklist: Patient identified, Emergency Drugs available, Suction available, Patient being monitored and Timeout performed Patient Re-evaluated:Patient Re-evaluated prior to induction Oxygen Delivery Method: Simple face mask Preoxygenation: Pre-oxygenation with 100% oxygen Induction Type: IV induction Comments: pom

## 2022-11-05 NOTE — Anesthesia Preprocedure Evaluation (Addendum)
Anesthesia Evaluation  Patient identified by MRN, date of birth, ID band Patient awake    Reviewed: Allergy & Precautions, NPO status , Patient's Chart, lab work & pertinent test results  Airway Mallampati: II  TM Distance: >3 FB Neck ROM: Full    Dental  (+) Teeth Intact   Pulmonary neg pulmonary ROS   Pulmonary exam normal breath sounds clear to auscultation       Cardiovascular Exercise Tolerance: Good hypertension, Pt. on medications negative cardio ROS Normal cardiovascular exam Rhythm:Regular Rate:Normal     Neuro/Psych  Headaches PSYCHIATRIC DISORDERS       Neuromuscular disease negative neurological ROS  negative psych ROS   GI/Hepatic negative GI ROS, Neg liver ROS, hiatal hernia,GERD  Medicated,,  Endo/Other  negative endocrine ROSHypothyroidism  Morbid obesity  Renal/GU negative Renal ROS  negative genitourinary   Musculoskeletal   Abdominal  (+) + obese  Peds negative pediatric ROS (+)  Hematology negative hematology ROS (+)   Anesthesia Other Findings Past Medical History: No date: Chronic headaches     Comment:  several per week No date: Gastritis No date: GERD (gastroesophageal reflux disease) No date: Hiatal hernia No date: Hyperlipidemia No date: Hypertension No date: Obesity (BMI 30-39.9) No date: Reflux No date: Spinal stenosis No date: Thyroid disease No date: Vertigo     Comment:  rare No date: Wears contact lenses  Past Surgical History: 02/27/2016: CARDIAC CATHETERIZATION     Comment:  Procedure: Left Heart Cath and Coronary Angiography;                Surgeon: Jettie Booze, MD;  Location: [place]               CV LAB;  Service: Cardiovascular;; No date: CESAREAN SECTION No date: CHOLECYSTECTOMY 04/15/2019: COLONOSCOPY WITH PROPOFOL; N/A     Comment:  Procedure: COLONOSCOPY WITH PROPOFOL;  Surgeon:               Virgel Manifold, MD;  Location: [facility] END[facility];                 Service: Endoscopy;  Laterality: N/A; 04/15/2019: ESOPHAGOGASTRODUODENOSCOPY (EGD) WITH PROPOFOL; N/A     Comment:  Procedure: ESOPHAGOGASTRODUODENOSCOPY (EGD) WITH               PROPOFOL;  Surgeon: Virgel Manifold, MD;  Location:               [facility];  Service: Endoscopy;  Laterality: N/A; 02/06/2022: SHOULDER ARTHROSCOPY WITH DEBRIDEMENT AND BICEP TENDON  REPAIR; Left     Comment:  Procedure: Left shoulder arthroscopic debridement,               subacromial decompression, open subpectoral biceps               tenodesis, and  Regeneten Patch application;  Surgeon:               Leim Fabry, MD;  Location: [place];                Service: Orthopedics;  Laterality: Left; No date: THYROIDECTOMY No date: THYROIDECTOMY  BMI    Body Mass Index: 41.54 kg/m      Reproductive/Obstetrics negative OB ROS                             Anesthesia Physical Anesthesia Plan  ASA: 3  Anesthesia Plan: General   Post-op  Pain Management:    Induction: Intravenous  PONV Risk Score and Plan: Propofol infusion and TIVA  Airway Management Planned: Natural Airway  Additional Equipment:   Intra-op Plan:   Post-operative Plan:   Informed Consent: I have reviewed the patients History and Physical, chart, labs and discussed the procedure including the risks, benefits and alternatives for the proposed anesthesia with the patient or authorized representative who has indicated his/her understanding and acceptance.     Dental Advisory Given  Plan Discussed with: CRNA and Surgeon  Anesthesia Plan Comments:        Anesthesia Quick Evaluation

## 2022-11-05 NOTE — Op Note (Signed)
[facility] Gastroenterology Patient Name: Morgan Stevens Procedure Date: 11/05/2022 11:00 AM MRN: [id number] Account #: [id number] Date of Birth: Apr 28, 1966 Admit Type: Outpatient Age: 57 Room: [facility] ENDO ROOM 2 Gender: Female Note Status: Finalized Instrument Name: Upper Endoscope [id number] Procedure:             Upper GI endoscopy Indications:           Dysphagia Providers:             Annamaria Helling DO, DO Medicines:             Monitored Anesthesia Care Complications:         No immediate complications. Estimated blood loss:                         Minimal. Procedure:             Pre-Anesthesia Assessment:                        - Prior to the procedure, a History and Physical was                         performed, and patient medications and allergies were                         reviewed. The patient is competent. The risks and                         benefits of the procedure and the sedation options and                         risks were discussed with the patient. All questions                         were answered and informed consent was obtained.                         Patient identification and proposed procedure were                         verified by the physician, the nurse, the anesthetist                         and the technician in the endoscopy suite. Mental                         Status Examination: alert and oriented. Airway                         Examination: normal oropharyngeal airway and neck                         mobility. Respiratory Examination: clear to                         auscultation. CV Examination: RRR, no murmurs, no S3                         or S4. Prophylactic Antibiotics: The patient does not  require prophylactic antibiotics. Prior                         Anticoagulants: The patient has taken no anticoagulant                         or antiplatelet agents. ASA Grade Assessment: III - A                          patient with severe systemic disease. After reviewing                         the risks and benefits, the patient was deemed in                         satisfactory condition to undergo the procedure. The                         anesthesia plan was to use monitored anesthesia care                         (MAC). Immediately prior to administration of                         medications, the patient was re-assessed for adequacy                         to receive sedatives. The heart rate, respiratory                         rate, oxygen saturations, blood pressure, adequacy of                         pulmonary ventilation, and response to care were                         monitored throughout the procedure. The physical                         status of the patient was re-assessed after the                         procedure.                        After obtaining informed consent, the endoscope was                         passed under direct vision. Throughout the procedure,                         the patient's blood pressure, pulse, and oxygen                         saturations were monitored continuously. The Endoscope                         was introduced through the mouth, and advanced to the  second part of duodenum. The upper GI endoscopy was                         accomplished without difficulty. The patient tolerated                         the procedure well. Findings:      The duodenal bulb, first portion of the duodenum and second portion of       the duodenum were normal. Estimated blood loss: none.      Localized moderate inflammation characterized by erosions and erythema       was found in the gastric antrum. Biopsies were taken with a cold forceps       for Helicobacter pylori testing. Estimated blood loss was minimal.      A 3 cm hiatal hernia was present. Estimated blood loss: none.      The exam of the stomach was otherwise  normal.      The Z-line was regular. Estimated blood loss: none.      Esophagogastric landmarks were identified: the gastroesophageal junction       was found at 37 cm from the incisors.      A widely patent Schatzki ring was found at the gastroesophageal       junction. The scope was withdrawn. Dilation was performed with a Maloney       dilator with no resistance at 52 Fr. The dilation site was examined       following endoscope reinsertion and showed no change. Estimated blood       loss: none. This was biopsied with a cold forceps for disrupting       schatski ring (two bites). No specimens collected. Estimated blood loss       was minimal.      The examined esophagus was moderately tortuous. Estimated blood loss:       none.      Abnormal motility was noted in the esophagus. The cricopharyngeus was       normal. There is spasticity of the esophageal body. The distal       esophagus/lower esophageal sphincter is open. Estimated blood loss: none.      The exam of the esophagus was otherwise normal. Impression:            - Normal duodenal bulb, first portion of the duodenum                         and second portion of the duodenum.                        - Gastritis. Biopsied.                        - 3 cm hiatal hernia.                        - Z-line regular.                        - Esophagogastric landmarks identified.                        - Widely patent Schatzki ring. Dilated. Biopsied.                        -  Tortuous esophagus.                        - Abnormal esophageal motility, consistent with                         esophageal spasm. Recommendation:        - Patient has a contact number available for                         emergencies. The signs and symptoms of potential                         delayed complications were discussed with the patient.                         Return to normal activities tomorrow. Written                         discharge instructions  were provided to the patient.                        - Discharge patient to home.                        - Soft diet today.                        - Continue present medications.                        - Await pathology results.                        - Return to GI clinic as previously scheduled.                        - The findings and recommendations were discussed with                         the patient. Procedure Code(s):     --- Professional ---                        [phone], Esophagogastroduodenoscopy, flexible,                         transoral; with biopsy, single or multiple                        [id number], Dilation of esophagus, by unguided sound or                         bougie, single or multiple passes Diagnosis Code(s):     --- Professional ---                        K29.70, Gastritis, unspecified, without bleeding                        K44.9, Diaphragmatic hernia without obstruction or  gangrene                        K22.2, Esophageal obstruction                        Q39.9, Congenital malformation of esophagus,                         unspecified                        K22.4, Dyskinesia of esophagus                        R13.10, Dysphagia, unspecified CPT copyright 2022 American Medical Association. All rights reserved. The codes documented in this report are preliminary and upon coder review may  be revised to meet current compliance requirements. Attending Participation:      I personally performed the entire procedure. Volney American, DO Annamaria Helling DO, DO 11/05/2022 11:30:22 AM This report has been signed electronically. Number of Addenda: 0 Note Initiated On: 11/05/2022 11:00 AM Estimated Blood Loss:  Estimated blood loss was minimal.      [facility]

## 2022-11-05 NOTE — H&P (Signed)
Pre-Procedure H&P   Patient ID: Morgan Stevens is a 57 y.o. female.  Gastroenterology Provider: Annamaria Helling, DO  Referring Provider: Dawson Bills, NP PCP: Olin Hauser, DO  Date: 11/05/2022  HPI Morgan Stevens is a 57 y.o. female who presents today for Esophagogastroduodenoscopy for dysphagia, gerd.  Patient has had longstanding reflux and dysphagia.  Sure reports despite previous EGDs her dysphagia has not changed.  Her last EGD was in 2020 with widely patent Schatzki's ring and a hiatal hernia.  Gastropathy was demonstrated was negative for H. pylori.  Biopsies from the esophagus were negative for EOE.  Her duodenum was normal.  She did have fundic gland polyps.  Was also noted to have Candida in her esophagus.  She does have some breakthrough symptoms despite PPI use.  No odynophagia or weight loss  EGD in 2019 with similar findings as well in addition to grade a esophagitis and torturous esophagus.  Status postcholecystectomy and thyroidectomy  Denies NSAID use   Past Medical History:  Diagnosis Date   Chronic headaches    several per week   Gastritis    GERD (gastroesophageal reflux disease)    Hiatal hernia    Hyperlipidemia    Hypertension    Obesity (BMI 30-39.9)    Reflux    Spinal stenosis    Thyroid disease    Vertigo    rare   Wears contact lenses     Past Surgical History:  Procedure Laterality Date   CARDIAC CATHETERIZATION  02/27/2016   Procedure: Left Heart Cath and Coronary Angiography;  Surgeon: Jettie Booze, MD;  Location: [place] [facility];  Service: Cardiovascular;;   CESAREAN SECTION     CHOLECYSTECTOMY     COLONOSCOPY WITH PROPOFOL N/A 04/15/2019   Procedure: COLONOSCOPY WITH PROPOFOL;  Surgeon: Virgel Manifold, MD;  Location: [facility];  Service: Endoscopy;  Laterality: N/A;   ESOPHAGOGASTRODUODENOSCOPY (EGD) WITH PROPOFOL N/A 04/15/2019   Procedure: ESOPHAGOGASTRODUODENOSCOPY (EGD) WITH PROPOFOL;   Surgeon: Virgel Manifold, MD;  Location: [facility] ENDOSCOPY;  Service: Endoscopy;  Laterality: N/A;   SHOULDER ARTHROSCOPY WITH DEBRIDEMENT AND BICEP TENDON REPAIR Left 02/06/2022   Procedure: Left shoulder arthroscopic debridement, subacromial decompression, open subpectoral biceps tenodesis, and  Regeneten Patch application;  Surgeon: Leim Fabry, MD;  Location: [place];  Service: Orthopedics;  Laterality: Left;   THYROIDECTOMY     THYROIDECTOMY      Family History No h/o GI disease or malignancy  Review of Systems  Constitutional:  Negative for activity change, appetite change, chills, diaphoresis, fatigue, fever and unexpected weight change.  HENT:  Positive for trouble swallowing. Negative for voice change.   Respiratory:  Negative for shortness of breath and wheezing.   Cardiovascular:  Negative for chest pain, palpitations and leg swelling.  Gastrointestinal:  Negative for abdominal distention, abdominal pain, anal bleeding, blood in stool, constipation, diarrhea, nausea, rectal pain and vomiting.  Musculoskeletal:  Negative for arthralgias and myalgias.  Skin:  Negative for color change and pallor.  Neurological:  Negative for dizziness, syncope and weakness.  Psychiatric/Behavioral:  Negative for confusion.   All other systems reviewed and are negative.    Medications No current facility-administered medications on file prior to encounter.   Current Outpatient Medications on File Prior to Encounter  Medication Sig Dispense Refill   acetaminophen (TYLENOL) 500 MG tablet Take 2 tablets (1,000 mg total) by mouth every 8 (eight) hours. 90 tablet 2   amLODipine (NORVASC) 2.5 MG tablet Take  1 tablet by mouth once daily 90 tablet 0   atorvastatin (LIPITOR) 20 MG tablet Take 1 tablet (20 mg total) by mouth at bedtime. 90 tablet 1   Baclofen 5 MG TABS TAKE 1 TO 2 TABLETS BY MOUTH THREE TIMES DAILY AS NEEDED FOR MUSCLE SPASM 180 tablet 0   EUTHYROX 125 MCG tablet Take 1  tablet (125 mcg total) by mouth daily before breakfast. 90 tablet 3   albuterol (VENTOLIN HFA) 108 (90 Base) MCG/ACT inhaler Inhale 2 puffs into the lungs every 4 (four) hours as needed for wheezing or shortness of breath. 6.7 g 2   Calcium Carbonate-Simethicone (TUMS GAS RELIEF CHEWY BITES PO) Take by mouth.     fluticasone (FLONASE) 50 MCG/ACT nasal spray Place 2 sprays into both nostrils daily. Use for 4-6 weeks then stop and use seasonally or as needed. 16 g 3    Pertinent medications related to GI and procedure were reviewed by me with the patient prior to the procedure   Current Facility-Administered Medications:    0.9 %  sodium chloride infusion, , Intravenous, Continuous, Annamaria Helling, DO, Last Rate: 20 mL/hr at 11/05/22 1046, New Bag at 11/05/22 1046  sodium chloride 20 mL/hr at 11/05/22 1046       No Known Allergies Allergies were reviewed by me prior to the procedure  Objective   Body mass index is 41.54 kg/m. Vitals:   11/05/22 1028  BP: 128/74  Pulse: 79  Resp: 16  Temp: (!) 95.6 F (35.3 C)  TempSrc: Temporal  SpO2: 100%  Weight: 109.8 kg  Height: '5\' 4"'$  (1.626 m)     Physical Exam Vitals and nursing note reviewed.  Constitutional:      General: She is not in acute distress.    Appearance: Normal appearance. She is obese. She is not ill-appearing, toxic-appearing or diaphoretic.  HENT:     Head: Normocephalic and atraumatic.     Nose: Nose normal.     Mouth/Throat:     Mouth: Mucous membranes are moist.     Pharynx: Oropharynx is clear.  Eyes:     General: No scleral icterus.    Extraocular Movements: Extraocular movements intact.  Cardiovascular:     Rate and Rhythm: Normal rate and regular rhythm.     Heart sounds: Normal heart sounds. No murmur heard.    No friction rub. No gallop.  Pulmonary:     Effort: Pulmonary effort is normal. No respiratory distress.     Breath sounds: Normal breath sounds. No wheezing, rhonchi or rales.   Abdominal:     General: Bowel sounds are normal. There is no distension.     Palpations: Abdomen is soft.     Tenderness: There is no abdominal tenderness. There is no guarding or rebound.  Musculoskeletal:     Cervical back: Neck supple.     Right lower leg: No edema.     Left lower leg: No edema.  Skin:    General: Skin is warm and dry.     Coloration: Skin is not jaundiced or pale.  Neurological:     General: No focal deficit present.     Mental Status: She is alert and oriented to person, place, and time. Mental status is at baseline.  Psychiatric:        Mood and Affect: Mood normal.        Behavior: Behavior normal.        Thought Content: Thought content normal.  Judgment: Judgment normal.      Assessment:  Ms. Sharyon Janco is a 57 y.o. female  who presents today for Esophagogastroduodenoscopy for dysphagia, gerd .  Plan:  Esophagogastroduodenoscopy with possible intervention today  Esophagogastroduodenoscopy with possible biopsy, control of bleeding, polypectomy, and interventions as necessary has been discussed with the patient/patient representative. Informed consent was obtained from the patient/patient representative after explaining the indication, nature, and risks of the procedure including but not limited to death, bleeding, perforation, missed neoplasm/lesions, cardiorespiratory compromise, and reaction to medications. Opportunity for questions was given and appropriate answers were provided. Patient/patient representative has verbalized understanding is amenable to undergoing the procedure.   Annamaria Helling, DO  [facility]  Portions of the record may have been created with voice recognition software. Occasional wrong-word or 'sound-a-like' substitutions may have occurred due to the inherent limitations of voice recognition software.  Read the chart carefully and recognize, using context, where substitutions may have occurred.

## 2022-11-05 NOTE — Transfer of Care (Signed)
Immediate Anesthesia Transfer of Care Note  Patient: Morgan Stevens  Procedure(s) Performed: ESOPHAGOGASTRODUODENOSCOPY (EGD)  Patient Location: PACU and Endoscopy Unit  Anesthesia Type:General  Level of Consciousness: drowsy and patient cooperative  Airway & Oxygen Therapy: Patient Spontanous Breathing and Patient connected to face mask oxygen  Post-op Assessment: Report given to RN and Patient moving all extremities X 4  Post vital signs: Reviewed and stable  Last Vitals:  Vitals Value Taken Time  BP 154/84 11/05/22 1130  Temp 36.4 C 11/05/22 1131  Pulse 90 11/05/22 1131  Resp 16 11/05/22 1131  SpO2 99 % 11/05/22 1131  Vitals shown include unvalidated device data.  Last Pain:  Vitals:   11/05/22 1131  TempSrc: Temporal  PainSc: 0-No pain         Complications: No notable events documented.

## 2022-11-05 NOTE — Anesthesia Postprocedure Evaluation (Signed)
Anesthesia Post Note  Patient: Morgan Stevens  Procedure(s) Performed: ESOPHAGOGASTRODUODENOSCOPY (EGD)  Patient location during evaluation: PACU Anesthesia Type: General Level of consciousness: awake and oriented Pain management: pain level controlled Vital Signs Assessment: post-procedure vital signs reviewed and stable Respiratory status: spontaneous breathing and nonlabored ventilation Cardiovascular status: stable Anesthetic complications: no   No notable events documented.   Last Vitals:  Vitals:   11/05/22 1131 11/05/22 1140  BP:  (!) 156/85  Pulse:    Resp:    Temp: (!) 36.4 C   SpO2:      Last Pain:  Vitals:   11/05/22 1150  TempSrc:   PainSc: 0-No pain                 VAN STAVEREN,Morgan Stevens

## 2022-11-06 ENCOUNTER — Encounter: Payer: Self-pay | Admitting: Gastroenterology

## 2022-11-06 ENCOUNTER — Encounter
Admission: RE | Admit: 2022-11-06 | Discharge: 2022-11-06 | Disposition: A | Discharge status: Home / Self Care | Payer: Medicaid Other | Source: Ambulatory Visit | Attending: Neurosurgery | Admitting: Neurosurgery

## 2022-11-06 VITALS — BP 144/80 | HR 88 | Resp 16 | Ht 64.0 in | Wt 243.6 lb

## 2022-11-06 DIAGNOSIS — Z01812 Encounter for preprocedural laboratory examination: Secondary | ICD-10-CM | POA: Diagnosis not present

## 2022-11-06 HISTORY — DX: Prediabetes: R73.03

## 2022-11-06 HISTORY — DX: Anemia, unspecified: D64.9

## 2022-11-06 HISTORY — DX: Hypothyroidism, unspecified: E03.9

## 2022-11-06 LAB — TYPE AND SCREEN
ABO/RH(D): A POS
Antibody Screen: NEGATIVE

## 2022-11-06 LAB — SURGICAL PCR SCREEN
MRSA, PCR: NEGATIVE
Staphylococcus aureus: NEGATIVE

## 2022-11-06 LAB — POTASSIUM: Potassium: 3.3 mmol/L — ABNORMAL LOW (ref 3.5–5.1)

## 2022-11-06 LAB — SURGICAL PATHOLOGY

## 2022-11-06 NOTE — Patient Instructions (Addendum)
Your procedure is scheduled on: 11/14/22 - Wednesday Report to the Registration Desk on the 1st floor of the [facility]. To find out your arrival time, please call [phone] between 1PM - 3PM on: 11/13/22 - Tuesday If your arrival time is 6:00 am, do not arrive before that time as the [place] entrance doors do not open until 6:00 am.  REMEMBER: Instructions that are not followed completely may result in serious medical risk, up to and including death; or upon the discretion of your surgeon and anesthesiologist your surgery may need to be rescheduled.  Do not eat food after midnight the night before surgery.  No gum chewing or hard candies.  You may however, drink CLEAR liquids up to 2 hours before you are scheduled to arrive for your surgery. Do not drink anything within 2 hours of your scheduled arrival time.  Clear liquids include: - water  - apple juice without pulp - gatorade (not RED colors) - black coffee or tea (Do NOT add milk or creamers to the coffee or tea) Do NOT drink anything that is not on this list.  One week prior to surgery: Stop Anti-inflammatories (NSAIDS) such as Advil, Aleve, Ibuprofen, Motrin, Naproxen, Naprosyn and Aspirin based products such as Excedrin, Goody's Powder, BC Powder.   Stop ANY OVER THE COUNTER supplements until after surgery.  You may however, continue to take Tylenol if needed for pain up until the day of surgery.   TAKE ONLY THESE MEDICATIONS THE MORNING OF SURGERY WITH A SIP OF WATER:  pantoprazole (PROTONIX)  EUTHYROX  gabapentin (NEURONTIN)   Use inhalers albuterol (VENTOLIN HFA) on the day of surgery and bring to the hospital.  - stop aspirin 7 days prior, resume aspirin 14 days after   No Alcohol for 24 hours before or after surgery.  No Smoking including e-cigarettes for 24 hours before surgery.  No chewable tobacco products for at least 6 hours before surgery.  No nicotine patches on the day of surgery.  Do not  use any "recreational" drugs for at least a week (preferably 2 weeks) before your surgery.  Please be advised that the combination of cocaine and anesthesia may have negative outcomes, up to and including death. If you test positive for cocaine, your surgery will be cancelled.  On the morning of surgery brush your teeth with toothpaste and water, you may rinse your mouth with mouthwash if you wish. Do not swallow any toothpaste or mouthwash.  Use CHG Soap or wipes as directed on instruction sheet.  Do not wear jewelry, make-up, hairpins, clips or nail polish.  Do not wear lotions, powders, or perfumes.   Do not shave body hair from the neck down 48 hours before surgery.  Contact lenses, hearing aids and dentures may not be worn into surgery.  Do not bring valuables to the hospital. [facility] is not responsible for any missing/lost belongings or valuables.   Notify your doctor if there is any change in your medical condition (cold, fever, infection).  Wear comfortable clothing (specific to your surgery type) to the hospital.  After surgery, you can help prevent lung complications by doing breathing exercises.  Take deep breaths and cough every 1-2 hours. Your doctor may order a device called an Incentive Spirometer to help you take deep breaths. When coughing or sneezing, hold a pillow firmly against your incision with both hands. This is called "splinting." Doing this helps protect your incision. It also decreases belly discomfort.  If you are  being admitted to the hospital overnight, leave your suitcase in the car. After surgery it may be brought to your room.  In case of increased patient census, it may be necessary for you, the patient, to continue your postoperative care in the Same Day Surgery department.  If you are being discharged the day of surgery, you will not be allowed to drive home. You will need a responsible individual to drive you home and stay with you for 24 hours  after surgery.   If you are taking public transportation, you will need to have a responsible individual with you.  Please call the [place] Dept. at [phone] if you have any questions about these instructions.  Surgery Visitation Policy:  Patients undergoing a surgery or procedure may have two family members or support persons with them as long as the person is not COVID-19 positive or experiencing its symptoms.   Inpatient Visitation:    Visiting hours are 7 a.m. to 8 p.m. Up to four visitors are allowed at one time in a patient room. The visitors may rotate out with other people during the day. One designated support person (adult) may remain overnight.  Due to an increase in RSV and influenza rates and associated hospitalizations, children ages 24 and under will not be able to visit patients in [facility]. Masks continue to be strongly recommended.   Preparing for Surgery with CHLORHEXIDINE GLUCONATE (CHG) Soap  Chlorhexidine Gluconate (CHG) Soap  o An antiseptic cleaner that kills germs and bonds with the skin to continue killing germs even after washing  o Used for showering the night before surgery and morning of surgery  Before surgery, you can play an important role by reducing the number of germs on your skin.  CHG (Chlorhexidine gluconate) soap is an antiseptic cleanser which kills germs and bonds with the skin to continue killing germs even after washing.  Please do not use if you have an allergy to CHG or antibacterial soaps. If your skin becomes reddened/irritated stop using the CHG.  1. Shower the NIGHT BEFORE SURGERY and the MORNING OF SURGERY with CHG soap.  2. If you choose to wash your hair, wash your hair first as usual with your normal shampoo.  3. After shampooing, rinse your hair and body thoroughly to remove the shampoo.  4. Use CHG as you would any other liquid soap. You can apply CHG directly to the skin and wash gently  with a scrungie or a clean washcloth.  5. Apply the CHG soap to your body only from the neck down. Do not use on open wounds or open sores. Avoid contact with your eyes, ears, mouth, and genitals (private parts). Wash face and genitals (private parts) with your normal soap.  6. Wash thoroughly, paying special attention to the area where your surgery will be performed.  7. Thoroughly rinse your body with warm water.  8. Do not shower/wash with your normal soap after using and rinsing off the CHG soap.  9. Pat yourself dry with a clean towel.  10. Wear clean pajamas to bed the night before surgery.  12. Place clean sheets on your bed the night of your first shower and do not sleep with pets.  13. Shower again with the CHG soap on the day of surgery prior to arriving at the hospital.  14. Do not apply any deodorants/lotions/powders.  15. Please wear clean clothes to the hospital.

## 2022-11-08 ENCOUNTER — Other Ambulatory Visit: Payer: Self-pay | Admitting: Family Medicine

## 2022-11-08 DIAGNOSIS — I10 Essential (primary) hypertension: Secondary | ICD-10-CM

## 2022-11-08 DIAGNOSIS — E7800 Pure hypercholesterolemia, unspecified: Secondary | ICD-10-CM

## 2022-11-09 ENCOUNTER — Other Ambulatory Visit: Payer: Self-pay | Admitting: Family Medicine

## 2022-11-09 DIAGNOSIS — J9801 Acute bronchospasm: Secondary | ICD-10-CM

## 2022-11-09 DIAGNOSIS — R053 Chronic cough: Secondary | ICD-10-CM

## 2022-11-09 NOTE — Telephone Encounter (Signed)
Requested medication (s) are due for refill today: yes  Requested medication (s) are on the active medication list: yes  Last refill:  amlodipine - 08/28/22 #90 0 refills, lipitor- 05/17/22-11/13/22 #90 1 refill  Future visit scheduled: yes in 3 weeks   Notes to clinic:  protocol failed. Last labs 06/27/21. Do you want to refill Rxs?     Requested Prescriptions  Pending Prescriptions Disp Refills   amLODipine (NORVASC) 2.5 MG tablet [Pharmacy Med Name: amLODIPine Besylate 2.5 MG Oral Tablet] 90 tablet 0    Sig: Take 1 tablet by mouth once daily     Cardiovascular: Calcium Channel Blockers 2 Failed - 11/08/2022 10:25 PM      Failed - Last BP in normal range    BP Readings from Last 1 Encounters:  11/06/22 (!) 144/80         Passed - Last Heart Rate in normal range    Pulse Readings from Last 1 Encounters:  11/06/22 88         Passed - Valid encounter within last 6 months    Recent Outpatient Visits           3 weeks ago Pre-op examination   [facility] [place], Devonne Doughty, DO   1 month ago Eosinophilic esophagitis   [place], DO   3 months ago Eosinophilic esophagitis   [facility] [place] [facility] [place], Devonne Doughty, DO   5 months ago Eosinophilic esophagitis   [facility] [place], Devonne Doughty, DO   6 months ago Gastroesophageal reflux disease, unspecified whether esophagitis present   [place], Devonne Doughty, DO       Future Appointments             In 3 days Agbor-Etang, Aaron Edelman, MD [place] [facility] [place]   In 3 weeks [place], Devonne Doughty, [facility], [facility]             atorvastatin (LIPITOR) 20 MG tablet [Pharmacy Med Name: Atorvastatin Calcium 20 MG Oral Tablet] 90 tablet 0    Sig: TAKE 1 TABLET BY MOUTH AT BEDTIME     Cardiovascular:   Antilipid - Statins Failed - 11/08/2022 10:25 PM      Failed - Lipid Panel in normal range within the last 12 months    Cholesterol  Date Value Ref Range Status  06/27/2021 131 <200 mg/dL Final  05/08/2013 165 0 - 200 mg/dL Final   Ldl Cholesterol, Calc  Date Value Ref Range Status  05/08/2013 94 0 - 100 mg/dL Final   LDL Cholesterol (Calc)  Date Value Ref Range Status  06/27/2021 67 mg/dL (calc) Final    Comment:    Reference range: <100 . Desirable range <100 mg/dL for primary prevention;   <70 mg/dL for patients with CHD or diabetic patients  with > or = 2 CHD risk factors. Marland Kitchen LDL-C is now calculated using the Martin-Hopkins  calculation, which is a validated novel method providing  better accuracy than the Friedewald equation in the  estimation of LDL-C.  Cresenciano Genre et al. Annamaria Helling. [id number]): 2061-2068  (http://education.[employer].com/faq/FAQ164)    HDL Cholesterol  Date Value Ref Range Status  05/08/2013 40 40 - 60 mg/dL Final   HDL  Date Value Ref Range Status  06/27/2021 48 (L) > OR = 50 mg/dL Final   Triglycerides  Date Value Ref  Range Status  06/27/2021 82 <150 mg/dL Final  05/08/2013 154 0 - 200 mg/dL Final         Passed - Patient is not pregnant      Passed - Valid encounter within last 12 months    Recent Outpatient Visits           3 weeks ago Pre-op examination   [facility] [place], Devonne Doughty, DO   1 month ago Eosinophilic esophagitis   [place], DO   3 months ago Eosinophilic esophagitis   [place], DO   5 months ago Eosinophilic esophagitis   [facility] [place], Devonne Doughty, DO   6 months ago Gastroesophageal reflux disease, unspecified whether esophagitis present   [facility] Olin Hauser, DO       Future  Appointments             In 3 days Agbor-Etang, Aaron Edelman, MD [place] [facility] [place]   In 3 weeks [place], [facility], [facility]

## 2022-11-12 ENCOUNTER — Encounter: Payer: Self-pay | Admitting: Cardiology

## 2022-11-12 ENCOUNTER — Ambulatory Visit: Payer: Medicaid Other | Attending: Cardiology | Admitting: Cardiology

## 2022-11-12 VITALS — BP 134/98 | HR 97 | Ht 64.0 in | Wt 243.2 lb

## 2022-11-12 DIAGNOSIS — I10 Essential (primary) hypertension: Secondary | ICD-10-CM

## 2022-11-12 DIAGNOSIS — R0609 Other forms of dyspnea: Secondary | ICD-10-CM

## 2022-11-12 MED ORDER — AMLODIPINE BESYLATE 5 MG PO TABS: 5.0000 mg | ORAL_TABLET | Freq: Every day | ORAL | 0 refills | Status: DC

## 2022-11-12 NOTE — Telephone Encounter (Signed)
Requested Prescriptions  Pending Prescriptions Disp Refills   VENTOLIN HFA 108 (90 Base) MCG/ACT inhaler [Pharmacy Med Name: Ventolin HFA 108 (90 Base) MCG/ACT Inhalation Aerosol Solution] 18 g 0    Sig: INHALE 2 PUFFS BY MOUTH EVERY 4 HOURS AS NEEDED FOR WHEEZING FOR SHORTNESS OF BREATH     Pulmonology:  Beta Agonists 2 Failed - 11/09/2022  5:50 PM      Failed - Last BP in normal range    BP Readings from Last 1 Encounters:  11/06/22 (!) 144/80         Passed - Last Heart Rate in normal range    Pulse Readings from Last 1 Encounters:  11/06/22 88         Passed - Valid encounter within last 12 months    Recent Outpatient Visits           3 weeks ago Pre-op examination   [facility] [place], Devonne Doughty, DO   1 month ago Eosinophilic esophagitis   [place], DO   3 months ago Eosinophilic esophagitis   [place], DO   5 months ago Eosinophilic esophagitis   [place], DO   6 months ago Gastroesophageal reflux disease, unspecified whether esophagitis present   [facility] [place], Devonne Doughty, DO       Future Appointments             Today Kate Sable, MD [place] [facility] [place]   In 2 weeks [place], [facility], [facility]

## 2022-11-12 NOTE — Progress Notes (Signed)
Cardiology Office Note:    Date:  11/12/2022   ID:  Dennard Schaumann, DOB [birth year], MRN [id number]  PCP:  Olin Hauser, DO  Cardiologist:  Kate Sable, MD  Electrophysiologist:  None   Referring MD: Nobie Putnam *   Chief Complaint  Patient presents with   Follow-up    3 month f/u, had some chest pain with exertion 1 week ago    History of Present Illness:    Morgan Stevens is a 57 y.o. female with a hx of morbid obesity, GERD, hiatal hernia who presents for follow-up.   Previously seen for symptoms of dyspnea on exertion.  Workup with echocardiogram showed normal systolic function, impaired relaxation.  Had an episode of chest tightness while walking 2 weeks ago.  Has not had any episodes since.  Not sure if her symptoms were due to her reflux/hernia.  Blood pressures have been elevated of late with systolics in the 0000000.    Prior notes/studies Echo 08/2022 EF 60 to 65%, impaired relaxation. Echo 12/2019 EF 55 to 60% Coronary CT 11/2019 calcium score 0, no evidence of CAD.   Past Medical History:  Diagnosis Date   Anemia    Chronic headaches    several per week   Gastritis    GERD (gastroesophageal reflux disease)    Hiatal hernia    Hyperlipidemia    Hypertension    Hypothyroidism    Obesity (BMI 30-39.9)    Pre-diabetes    Reflux    Spinal stenosis    Thyroid disease    Vertigo    rare   Wears contact lenses     Past Surgical History:  Procedure Laterality Date   CARDIAC CATHETERIZATION  02/27/2016   Procedure: Left Heart Cath and Coronary Angiography;  Surgeon: Jettie Booze, MD;  Location: [place] CV [facility];  Service: Cardiovascular;;   CESAREAN SECTION     CHOLECYSTECTOMY     COLONOSCOPY WITH PROPOFOL N/A 04/15/2019   Procedure: COLONOSCOPY WITH PROPOFOL;  Surgeon: Virgel Manifold, MD;  Location: [facility] ENDOSCOPY;  Service: Endoscopy;  Laterality: N/A;   ESOPHAGOGASTRODUODENOSCOPY N/A 11/05/2022   Procedure:  ESOPHAGOGASTRODUODENOSCOPY (EGD);  Surgeon: Annamaria Helling, DO;  Location: [facility] ENDOSCOPY;  Service: Gastroenterology;  Laterality: N/A;   ESOPHAGOGASTRODUODENOSCOPY (EGD) WITH PROPOFOL N/A 04/15/2019   Procedure: ESOPHAGOGASTRODUODENOSCOPY (EGD) WITH PROPOFOL;  Surgeon: Virgel Manifold, MD;  Location: [facility] ENDOSCOPY;  Service: Endoscopy;  Laterality: N/A;   SHOULDER ARTHROSCOPY WITH DEBRIDEMENT AND BICEP TENDON REPAIR Left 02/06/2022   Procedure: Left shoulder arthroscopic debridement, subacromial decompression, open subpectoral biceps tenodesis, and  Regeneten Patch application;  Surgeon: Leim Fabry, MD;  Location: [place];  Service: Orthopedics;  Laterality: Left;   THYROIDECTOMY  2018   THYROIDECTOMY      Current Medications: Current Meds  Medication Sig   aspirin EC 81 MG tablet Take 81 mg by mouth at bedtime. Swallow whole.   atorvastatin (LIPITOR) 20 MG tablet TAKE 1 TABLET BY MOUTH AT BEDTIME   Baclofen 5 MG TABS TAKE 1 TO 2 TABLETS BY MOUTH THREE TIMES DAILY AS NEEDED FOR MUSCLE SPASM   Calcium Carbonate-Simethicone (TUMS GAS RELIEF CHEWY BITES PO) Take by mouth as needed.   EUTHYROX 125 MCG tablet Take 1 tablet (125 mcg total) by mouth daily before breakfast.   famotidine (PEPCID) 20 MG tablet Take 20 mg by mouth at bedtime.   fluticasone (FLONASE) 50 MCG/ACT nasal spray Place 2 sprays into both nostrils daily. Use for 4-6 weeks then stop  and use seasonally or as needed. (Patient taking differently: Place 2 sprays into both nostrils as needed. Use for 4-6 weeks then stop and use seasonally or as needed.)   gabapentin (NEURONTIN) 300 MG capsule Take 1 capsule (300 mg total) by mouth 2 (two) times daily.   montelukast (SINGULAIR) 10 MG tablet TAKE 1 TABLET BY MOUTH AT BEDTIME   pantoprazole (PROTONIX) 40 MG tablet Take 1 tablet (40 mg total) by mouth 2 (two) times daily before a meal.   VENTOLIN HFA 108 (90 Base) MCG/ACT inhaler INHALE 2 PUFFS BY MOUTH EVERY 4  HOURS AS NEEDED FOR WHEEZING FOR SHORTNESS OF BREATH   [DISCONTINUED] amLODipine (NORVASC) 2.5 MG tablet Take 1 tablet by mouth once daily     Allergies:   Patient has no known allergies.   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 There is no history of Breast cancer.  ROS:   Please see the history of present illness.     All other systems reviewed and are negative.  EKGs/Labs/Other Studies Reviewed:    The following studies were reviewed today:   EKG:  EKG not ordered today  Recent Labs: 09/09/2022: ALT 38; BUN 10; Creatinine, Ser 0.91; Hemoglobin 13.0; Platelets 282; Sodium 136 11/06/2022: Potassium 3.3  Recent Lipid Panel    Component Value Date/Time   CHOL 131 06/27/2021 0826   CHOL 165 05/08/2013 0013   TRIG 82 06/27/2021 0826   TRIG 154 05/08/2013 0013   HDL 48 (L) 06/27/2021 0826   HDL 40 05/08/2013 0013   CHOLHDL 2.7 06/27/2021 0826   VLDL 23 11/21/2020 0427   VLDL 31 05/08/2013 0013   LDLCALC 67 06/27/2021 0826   LDLCALC 94 05/08/2013 0013    Physical Exam:    VS:  BP  (!) 134/98 (BP Location: Left Arm, Patient Position: Sitting, Cuff Size: Large)   Pulse 97   Ht 5\' 4"  (1.626 m)   Wt 243 lb 3.2 oz (110.3 kg)   LMP 07/11/2020   SpO2 97%   BMI 41.75 kg/m     Wt Readings from Last 3 Encounters:  11/12/22 243 lb 3.2 oz (110.3 kg)  11/06/22 243 lb 9.7 oz (110.5 kg)  11/05/22 242 lb (109.8 kg)     GEN:  Well nourished, well developed in no acute distress HEENT: Normal NECK: No JVD; No carotid bruits CARDIAC: RRR, no murmurs, rubs, gallops RESPIRATORY:  Clear to auscultation without rales, wheezing or rhonchi  ABDOMEN: Soft, non-tender, non-distended MUSCULOSKELETAL:  No edema; No deformity  SKIN: Warm and dry NEUROLOGIC:  Alert and oriented x 3 PSYCHIATRIC:  Normal affect   ASSESSMENT:    1. Dyspnea on exertion   2. Primary hypertension   3. Morbid obesity ([place])    PLAN:    In order of problems listed above:  Dyspnea on exertion, chest pain appears noncardiac.  Dyspnea likely from morbid obesity.  Echo with normal EF 60 to 65%, impaired relaxation.  Coronary CT in 2021 no CAD.  Continue to monitor for now.  Consider repeat coronary CTA if symptoms persist. Hypertension, BP not at goal.  Increase Norvasc to 5 mg daily.  Low-salt diet advised. Morbid obesity, low-calorie diet, weight loss advised.  Follow-up in 3 months.  This note was generated in part or whole with voice recognition software. Voice recognition is usually quite accurate but there are transcription errors that can and very often do occur. I apologize for any typographical errors that were not detected and corrected.  Medication Adjustments/Labs and Tests Ordered: Current medicines are reviewed at length with the patient today.  Concerns regarding medicines are outlined above.  No orders of the defined types were placed in this encounter.  Meds ordered this encounter  Medications   amLODipine (NORVASC) 5 MG tablet    Sig: Take 1 tablet (5 mg total) by mouth daily.     Dispense:  90 tablet    Refill:  0    Patient Instructions  Medication Instructions:   INCREASE Amlodipine - take one tablet (5mg ) by mouth daily.   *If you need a refill on your cardiac medications before your next appointment, please call your pharmacy*   Lab Work:  None Ordered  If you have labs (blood work) drawn today and your tests are completely normal, you will receive your results only by: [place] (if you have MyChart) OR A paper copy in the mail If you have any lab test that is abnormal or we need to change your treatment, we will call you to review the results.   Testing/Procedures:  None Ordered   Follow-Up: At [facility], you and your health needs are our priority.  As part of our continuing mission to provide you with exceptional heart care, we have created designated Provider Care Teams.  These Care Teams include your primary Cardiologist (physician) and Advanced Practice Providers (APPs -  Physician Assistants and Nurse Practitioners) who all work together to provide you with the care you need, when you need it.  We recommend signing up for the patient portal called "MyChart".  Sign up information is provided on this After Visit Summary.  MyChart is used to connect with patients for Virtual Visits (Telemedicine).  Patients are able to view lab/test results, encounter notes, upcoming appointments, etc.  Non-urgent messages can be sent to your provider as well.   To learn more about what you can do with MyChart, go to [email or web address].    Your next appointment:   3 month(s)  Provider:   You may see Kate Sable, MD or one of the following Advanced Practice Providers on your designated Care Team:   Murray Hodgkins, NP Christell Faith, PA-C Cadence Kathlen Mody, PA-C Gerrie Nordmann, NP    Signed, Kate Sable, MD  11/12/2022 11:48 AM    [place]

## 2022-11-12 NOTE — Patient Instructions (Signed)
Medication Instructions:   INCREASE Amlodipine - take one tablet (5mg ) by mouth daily.   *If you need a refill on your cardiac medications before your next appointment, please call your pharmacy*   Lab Work:  None Ordered  If you have labs (blood work) drawn today and your tests are completely normal, you will receive your results only by: [place] (if you have MyChart) OR A paper copy in the mail If you have any lab test that is abnormal or we need to change your treatment, we will call you to review the results.   Testing/Procedures:  None Ordered   Follow-Up: At [facility], you and your health needs are our priority.  As part of our continuing mission to provide you with exceptional heart care, we have created designated Provider Care Teams.  These Care Teams include your primary Cardiologist (physician) and Advanced Practice Providers (APPs -  Physician Assistants and Nurse Practitioners) who all work together to provide you with the care you need, when you need it.  We recommend signing up for the patient portal called "MyChart".  Sign up information is provided on this After Visit Summary.  MyChart is used to connect with patients for Virtual Visits (Telemedicine).  Patients are able to view lab/test results, encounter notes, upcoming appointments, etc.  Non-urgent messages can be sent to your provider as well.   To learn more about what you can do with MyChart, go to [email or web address].    Your next appointment:   3 month(s)  Provider:   You may see Kate Sable, MD or one of the following Advanced Practice Providers on your designated Care Team:   Murray Hodgkins, NP Christell Faith, PA-C Cadence Kathlen Mody, PA-C Gerrie Nordmann, NP

## 2022-11-14 ENCOUNTER — Ambulatory Visit
Admission: RE | Admit: 2022-11-14 | Discharge: 2022-11-14 | Disposition: A | Discharge status: Home / Self Care | Payer: Medicaid Other | Attending: Neurosurgery | Admitting: Neurosurgery

## 2022-11-14 ENCOUNTER — Encounter
Admission: RE | Disposition: A | Discharge status: Home / Self Care | Payer: Self-pay | Source: Home / Self Care | Attending: Neurosurgery

## 2022-11-14 ENCOUNTER — Ambulatory Visit: Payer: Medicaid Other

## 2022-11-14 ENCOUNTER — Other Ambulatory Visit: Payer: Self-pay

## 2022-11-14 ENCOUNTER — Ambulatory Visit: Payer: Medicaid Other | Admitting: Urgent Care

## 2022-11-14 ENCOUNTER — Telehealth: Payer: Self-pay

## 2022-11-14 ENCOUNTER — Ambulatory Visit (INDEPENDENT_AMBULATORY_CARE_PROVIDER_SITE_OTHER): Payer: Medicaid Other | Admitting: Neurosurgery

## 2022-11-14 ENCOUNTER — Encounter: Payer: Self-pay | Admitting: Neurosurgery

## 2022-11-14 DIAGNOSIS — Z9889 Other specified postprocedural states: Secondary | ICD-10-CM

## 2022-11-14 DIAGNOSIS — Z6841 Body Mass Index (BMI) 40.0 and over, adult: Secondary | ICD-10-CM | POA: Insufficient documentation

## 2022-11-14 DIAGNOSIS — R292 Abnormal reflex: Secondary | ICD-10-CM | POA: Diagnosis not present

## 2022-11-14 DIAGNOSIS — Z79899 Other long term (current) drug therapy: Secondary | ICD-10-CM | POA: Insufficient documentation

## 2022-11-14 DIAGNOSIS — R7303 Prediabetes: Secondary | ICD-10-CM | POA: Diagnosis not present

## 2022-11-14 DIAGNOSIS — G959 Disease of spinal cord, unspecified: Secondary | ICD-10-CM | POA: Diagnosis not present

## 2022-11-14 DIAGNOSIS — E039 Hypothyroidism, unspecified: Secondary | ICD-10-CM | POA: Insufficient documentation

## 2022-11-14 DIAGNOSIS — I10 Essential (primary) hypertension: Secondary | ICD-10-CM | POA: Diagnosis not present

## 2022-11-14 DIAGNOSIS — M4322 Fusion of spine, cervical region: Secondary | ICD-10-CM | POA: Diagnosis not present

## 2022-11-14 DIAGNOSIS — R2689 Other abnormalities of gait and mobility: Secondary | ICD-10-CM | POA: Insufficient documentation

## 2022-11-14 DIAGNOSIS — K219 Gastro-esophageal reflux disease without esophagitis: Secondary | ICD-10-CM | POA: Diagnosis not present

## 2022-11-14 DIAGNOSIS — Z01818 Encounter for other preprocedural examination: Secondary | ICD-10-CM

## 2022-11-14 DIAGNOSIS — M4802 Spinal stenosis, cervical region: Secondary | ICD-10-CM

## 2022-11-14 DIAGNOSIS — M4712 Other spondylosis with myelopathy, cervical region: Secondary | ICD-10-CM | POA: Diagnosis not present

## 2022-11-14 DIAGNOSIS — Z09 Encounter for follow-up examination after completed treatment for conditions other than malignant neoplasm: Secondary | ICD-10-CM

## 2022-11-14 DIAGNOSIS — K449 Diaphragmatic hernia without obstruction or gangrene: Secondary | ICD-10-CM | POA: Diagnosis not present

## 2022-11-14 HISTORY — PX: ANTERIOR CERVICAL DECOMP/DISCECTOMY FUSION: SHX1161

## 2022-11-14 LAB — ABO/RH: ABO/RH(D): A POS

## 2022-11-14 SURGERY — ANTERIOR CERVICAL DECOMPRESSION/DISCECTOMY FUSION 2 LEVELS
Anesthesia: General | Site: Spine Cervical

## 2022-11-14 MED ORDER — PHENYLEPHRINE HCL-NACL 20-0.9 MG/250ML-% IV SOLN
INTRAVENOUS | Status: DC | PRN
  Administered 2022-11-14: 25 ug/min via INTRAVENOUS

## 2022-11-14 MED ORDER — PROPOFOL 1000 MG/100ML IV EMUL
INTRAVENOUS | Status: AC
  Filled 2022-11-14: qty 100

## 2022-11-14 MED ORDER — ACETAMINOPHEN 10 MG/ML IV SOLN
INTRAVENOUS | Status: AC
  Filled 2022-11-14: qty 100

## 2022-11-14 MED ORDER — FENTANYL CITRATE (PF) 100 MCG/2ML IJ SOLN
INTRAMUSCULAR | Status: AC
  Filled 2022-11-14: qty 2

## 2022-11-14 MED ORDER — BUPIVACAINE HCL (PF) 0.5 % IJ SOLN
INTRAMUSCULAR | Status: AC
  Filled 2022-11-14: qty 30

## 2022-11-14 MED ORDER — OXYCODONE HCL 5 MG PO TABS: 5.0000 mg | ORAL_TABLET | ORAL | 0 refills | Status: DC | PRN

## 2022-11-14 MED ORDER — REMIFENTANIL HCL 1 MG IV SOLR
INTRAVENOUS | Status: AC
  Filled 2022-11-14: qty 1000

## 2022-11-14 MED ORDER — ORAL CARE MOUTH RINSE: 15.0000 mL | Freq: Once | OROMUCOSAL | Status: AC

## 2022-11-14 MED ORDER — FENTANYL CITRATE (PF) 100 MCG/2ML IJ SOLN
25.0000 ug | INTRAMUSCULAR | Status: DC | PRN
  Administered 2022-11-14 (×3): 25 ug via INTRAVENOUS

## 2022-11-14 MED ORDER — SURGIFLO WITH THROMBIN (HEMOSTATIC MATRIX KIT) OPTIME
TOPICAL | Status: DC | PRN
  Administered 2022-11-14: 1 via TOPICAL

## 2022-11-14 MED ORDER — ACETAMINOPHEN 10 MG/ML IV SOLN: 1000.0000 mg | Freq: Once | INTRAVENOUS | Status: DC | PRN

## 2022-11-14 MED ORDER — REMIFENTANIL HCL 1 MG IV SOLR
INTRAVENOUS | Status: DC | PRN
  Administered 2022-11-14: .2 ug/kg/min via INTRAVENOUS

## 2022-11-14 MED ORDER — DEXAMETHASONE SODIUM PHOSPHATE 10 MG/ML IJ SOLN
INTRAMUSCULAR | Status: DC | PRN
  Administered 2022-11-14: 10 mg via INTRAVENOUS

## 2022-11-14 MED ORDER — CEFAZOLIN SODIUM-DEXTROSE 2-4 GM/100ML-% IV SOLN
INTRAVENOUS | Status: AC
  Filled 2022-11-14: qty 100

## 2022-11-14 MED ORDER — SENNA 8.6 MG PO TABS: 1.0000 | ORAL_TABLET | Freq: Every day | ORAL | 0 refills | Status: DC | PRN

## 2022-11-14 MED ORDER — ONDANSETRON HCL 4 MG/2ML IJ SOLN
INTRAMUSCULAR | Status: DC | PRN
  Administered 2022-11-14 (×2): 4 mg via INTRAVENOUS

## 2022-11-14 MED ORDER — OXYCODONE HCL 5 MG PO TABS
ORAL_TABLET | ORAL | Status: AC
  Filled 2022-11-14: qty 1

## 2022-11-14 MED ORDER — FENTANYL CITRATE (PF) 100 MCG/2ML IJ SOLN
INTRAMUSCULAR | Status: AC
  Administered 2022-11-14: 25 ug via INTRAVENOUS
  Filled 2022-11-14: qty 2

## 2022-11-14 MED ORDER — BUPIVACAINE-EPINEPHRINE (PF) 0.5% -1:200000 IJ SOLN
INTRAMUSCULAR | Status: DC | PRN
  Administered 2022-11-14: 2.4 mL

## 2022-11-14 MED ORDER — LACTATED RINGERS IV SOLN: INTRAVENOUS | Status: DC

## 2022-11-14 MED ORDER — METHOCARBAMOL 500 MG PO TABS: 500.0000 mg | ORAL_TABLET | Freq: Four times a day (QID) | ORAL | 0 refills | Status: DC

## 2022-11-14 MED ORDER — OXYCODONE HCL 5 MG PO TABS
5.0000 mg | ORAL_TABLET | Freq: Once | ORAL | Status: AC | PRN
  Administered 2022-11-14: 5 mg via ORAL

## 2022-11-14 MED ORDER — FENTANYL CITRATE (PF) 100 MCG/2ML IJ SOLN
INTRAMUSCULAR | Status: DC | PRN
  Administered 2022-11-14 (×2): 50 ug via INTRAVENOUS

## 2022-11-14 MED ORDER — PROPOFOL 500 MG/50ML IV EMUL
INTRAVENOUS | Status: DC | PRN
  Administered 2022-11-14: 165 ug/kg/min via INTRAVENOUS

## 2022-11-14 MED ORDER — EPHEDRINE SULFATE (PRESSORS) 50 MG/ML IJ SOLN
INTRAMUSCULAR | Status: DC | PRN
  Administered 2022-11-14: 10 mg via INTRAVENOUS
  Administered 2022-11-14 (×2): 5 mg via INTRAVENOUS
  Administered 2022-11-14: 10 mg via INTRAVENOUS

## 2022-11-14 MED ORDER — GLYCOPYRROLATE 0.2 MG/ML IJ SOLN
INTRAMUSCULAR | Status: DC | PRN
  Administered 2022-11-14: .2 mg via INTRAVENOUS

## 2022-11-14 MED ORDER — SUCCINYLCHOLINE CHLORIDE 200 MG/10ML IV SOSY
PREFILLED_SYRINGE | INTRAVENOUS | Status: DC | PRN
  Administered 2022-11-14: 120 mg via INTRAVENOUS

## 2022-11-14 MED ORDER — PROPOFOL 10 MG/ML IV BOLUS
INTRAVENOUS | Status: AC
  Filled 2022-11-14: qty 20

## 2022-11-14 MED ORDER — PHENYLEPHRINE 80 MCG/ML (10ML) SYRINGE FOR IV PUSH (FOR BLOOD PRESSURE SUPPORT)
PREFILLED_SYRINGE | INTRAVENOUS | Status: DC | PRN
  Administered 2022-11-14 (×4): 160 ug via INTRAVENOUS
  Administered 2022-11-14: 80 ug via INTRAVENOUS
  Administered 2022-11-14: 160 ug via INTRAVENOUS

## 2022-11-14 MED ORDER — MIDAZOLAM HCL 2 MG/2ML IJ SOLN
INTRAMUSCULAR | Status: DC | PRN
  Administered 2022-11-14: 2 mg via INTRAVENOUS

## 2022-11-14 MED ORDER — CHLORHEXIDINE GLUCONATE 0.12 % MT SOLN
15.0000 mL | Freq: Once | OROMUCOSAL | Status: AC
  Administered 2022-11-14: 15 mL via OROMUCOSAL

## 2022-11-14 MED ORDER — CEFAZOLIN SODIUM-DEXTROSE 2-4 GM/100ML-% IV SOLN
2.0000 g | Freq: Once | INTRAVENOUS | Status: AC
  Administered 2022-11-14: 2 g via INTRAVENOUS

## 2022-11-14 MED ORDER — ONDANSETRON HCL 4 MG/2ML IJ SOLN: 4.0000 mg | Freq: Once | INTRAMUSCULAR | Status: DC | PRN

## 2022-11-14 MED ORDER — LIDOCAINE HCL (CARDIAC) PF 100 MG/5ML IV SOSY
PREFILLED_SYRINGE | INTRAVENOUS | Status: DC | PRN
  Administered 2022-11-14: 100 mg via INTRAVENOUS

## 2022-11-14 MED ORDER — OXYCODONE HCL 5 MG/5ML PO SOLN: 5.0000 mg | Freq: Once | ORAL | Status: AC | PRN

## 2022-11-14 MED ORDER — CHLORHEXIDINE GLUCONATE 0.12 % MT SOLN
OROMUCOSAL | Status: AC
  Filled 2022-11-14: qty 15

## 2022-11-14 MED ORDER — EPINEPHRINE PF 1 MG/ML IJ SOLN
INTRAMUSCULAR | Status: AC
  Filled 2022-11-14: qty 1

## 2022-11-14 MED ORDER — METHOCARBAMOL 500 MG PO TABS
500.0000 mg | ORAL_TABLET | Freq: Four times a day (QID) | ORAL | Status: DC
  Administered 2022-11-14: 500 mg via ORAL

## 2022-11-14 MED ORDER — ROCURONIUM BROMIDE 100 MG/10ML IV SOLN: INTRAVENOUS | Status: DC | PRN

## 2022-11-14 MED ORDER — METHOCARBAMOL 500 MG PO TABS
ORAL_TABLET | ORAL | Status: AC
  Filled 2022-11-14: qty 1

## 2022-11-14 MED ORDER — ACETAMINOPHEN 10 MG/ML IV SOLN
INTRAVENOUS | Status: DC | PRN
  Administered 2022-11-14: 1000 mg via INTRAVENOUS

## 2022-11-14 MED ORDER — PROPOFOL 10 MG/ML IV BOLUS
INTRAVENOUS | Status: DC | PRN
  Administered 2022-11-14: 150 mg via INTRAVENOUS
  Administered 2022-11-14 (×2): 50 mg via INTRAVENOUS

## 2022-11-14 MED ORDER — 0.9 % SODIUM CHLORIDE (POUR BTL) OPTIME
TOPICAL | Status: DC | PRN
  Administered 2022-11-14: 500 mL

## 2022-11-14 MED ORDER — OXYCODONE HCL 5 MG PO TABS
5.0000 mg | ORAL_TABLET | ORAL | Status: AC
  Administered 2022-11-14: 5 mg via ORAL

## 2022-11-14 MED ORDER — MIDAZOLAM HCL 2 MG/2ML IJ SOLN
INTRAMUSCULAR | Status: AC
  Filled 2022-11-14: qty 2

## 2022-11-14 SURGICAL SUPPLY — 52 items
ADH SKN CLS APL DERMABOND .7 (GAUZE/BANDAGES/DRESSINGS) ×1
AGENT HMST KT MTR STRL THRMB (HEMOSTASIS) ×1
ALLOGRAFT BONE FIBER KORE 1CC (Bone Implant) IMPLANT
BASIN KIT SINGLE STR (MISCELLANEOUS) ×1 IMPLANT
BASKET BONE COLLECTION (BASKET) IMPLANT
BULB RESERV EVAC DRAIN JP 100C (MISCELLANEOUS) IMPLANT
BUR NEURO DRILL SOFT 3.0X3.8M (BURR) ×1 IMPLANT
DERMABOND ADVANCED .7 DNX12 (GAUZE/BANDAGES/DRESSINGS) ×1 IMPLANT
DRAIN CHANNEL JP 10F RND 20C F (MISCELLANEOUS) IMPLANT
DRAPE C ARM PK CFD 31 SPINE (DRAPES) ×1 IMPLANT
DRAPE LAPAROTOMY 77X122 PED (DRAPES) ×1 IMPLANT
DRAPE MICROSCOPE SPINE 48X150 (DRAPES) ×1 IMPLANT
DRSG OPSITE POSTOP 4X6 (GAUZE/BANDAGES/DRESSINGS) ×2 IMPLANT
ELECT REM PT RETURN 9FT ADLT (ELECTROSURGICAL) ×1
ELECTRODE REM PT RTRN 9FT ADLT (ELECTROSURGICAL) ×1 IMPLANT
FEE INTRAOP CADWELL SUPPLY NCS (MISCELLANEOUS) IMPLANT
FEE INTRAOP MONITOR IMPULS NCS (MISCELLANEOUS) IMPLANT
GLOVE BIOGEL PI IND STRL 6.5 (GLOVE) ×2 IMPLANT
GLOVE SURG SYN 6.5 ES PF (GLOVE) ×2 IMPLANT
GLOVE SURG SYN 6.5 PF PI (GLOVE) ×2 IMPLANT
GLOVE SURG SYN 8.5  E (GLOVE) ×3
GLOVE SURG SYN 8.5 E (GLOVE) ×3 IMPLANT
GLOVE SURG SYN 8.5 PF PI (GLOVE) ×3 IMPLANT
GOWN SRG LRG LVL 4 IMPRV REINF (GOWNS) ×2 IMPLANT
GOWN SRG XL LVL 3 NONREINFORCE (GOWNS) ×1 IMPLANT
GOWN STRL NON-REIN TWL XL LVL3 (GOWNS) ×1
GOWN STRL REIN LRG LVL4 (GOWNS) ×2
INTRAOP CADWELL SUPPLY FEE NCS (MISCELLANEOUS) ×1
INTRAOP DISP SUPPLY FEE NCS (MISCELLANEOUS) ×1
INTRAOP MONITOR FEE IMPULS NCS (MISCELLANEOUS) ×1
INTRAOP MONITOR FEE IMPULSE (MISCELLANEOUS) ×1
KIT TURNOVER KIT A (KITS) ×1 IMPLANT
MANIFOLD NEPTUNE II (INSTRUMENTS) ×1 IMPLANT
NDL SAFETY ECLIP 18X1.5 (MISCELLANEOUS) ×1 IMPLANT
NS IRRIG 1000ML POUR BTL (IV SOLUTION) ×1 IMPLANT
PACK LAMINECTOMY NEURO (CUSTOM PROCEDURE TRAY) ×1 IMPLANT
PAD ARMBOARD 7.5X6 YLW CONV (MISCELLANEOUS) ×2 IMPLANT
PIN CASPAR 14 (PIN) ×1 IMPLANT
PIN CASPAR 14MM (PIN) ×1
PLATE ACP 1.6V PLATE 34 2L (Plate) IMPLANT
SCREW ACP VA SD 3.5X15 (Screw) IMPLANT
SPACER C HEDRON 12X14 7M 7D (Spacer) IMPLANT
SPONGE KITTNER 5P (MISCELLANEOUS) ×1 IMPLANT
STAPLER SKIN PROX 35W (STAPLE) IMPLANT
SURGIFLO W/THROMBIN 8M KIT (HEMOSTASIS) ×1 IMPLANT
SUT V-LOC 90 ABS DVC 3-0 CL (SUTURE) ×1 IMPLANT
SUT VIC AB 3-0 SH 8-18 (SUTURE) ×1 IMPLANT
SUT VICRYL 3-0 CR8 SH (SUTURE) ×1 IMPLANT
SYR 20ML LL LF (SYRINGE) ×1 IMPLANT
TAPE CLOTH 3X10 WHT NS LF (GAUZE/BANDAGES/DRESSINGS) ×3 IMPLANT
TRAP FLUID SMOKE EVACUATOR (MISCELLANEOUS) ×1 IMPLANT
TRAY FOLEY MTR SLVR 16FR STAT (SET/KITS/TRAYS/PACK) IMPLANT

## 2022-11-14 NOTE — Progress Notes (Signed)
Upon arrival to post op, patient ambulated to bathroom and voided.  Safety maintained

## 2022-11-14 NOTE — Progress Notes (Signed)
Pt able to void, eat food and ambulate safely.

## 2022-11-14 NOTE — Op Note (Signed)
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 The patient tried and failed conservative management, prompting surgical intervention.  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   Preoperative Note:   Risks of surgery discussed include: infection, bleeding, stroke, coma, death, paralysis, CSF leak, nerve/spinal cord injury, numbness, tingling, weakness, complex regional pain syndrome, recurrent stenosis and/or disc herniation, vascular injury, development of instability, neck/back pain, need for further surgery, persistent symptoms, development of deformity, and the risks of anesthesia. The patient understood these risks and agreed to proceed.  Operative Note:   Procedure:  1) Anterior cervical diskectomy and fusion at C5/6 and C6/7 2) Anterior cervical instrumentation at C5 - 7 using Nuvasive ACP 3) Placement of biomechanical devices at C5/6 and C6/7  4) Use of operative microscope 5) Use of flouroscopy   Procedure: After obtaining informed consent, the patient taken to the operating room, placed in supine position, general anesthesia induced.  The patient had a small shoulder roll placed behind their shoulders.  The patient received preop antibiotics and IV Decadron.  The patient had a neck incision outlined, was prepped and draped in usual sterile fashion. The incision was injected with local anesthetic.   An incision was opened, dissection taken down medial to the carotid artery and jugular vein, lateral to the trachea and esophagus.  The prevertebral fascia identified and a localizing x-ray demonstrated the correct level.  The longus colli were dissected laterally, and self-retaining retractors placed to  open the operative field. The microscope was then brought into the field.  With this complete, distractor pins were placed in the vertebral bodies of C5 and C7. The distractor was placed, and the annuli at C5/6 and C6/7 were opened using a bovie.  Curettes and pituitary rongeurs used to remove the majority of disk, then the drill was used to remove the posterior osteophyte and begin the foraminotomies. The nerve hook was used to elevate the posterior longitudinal ligament, which was then removed with Kerrison rongeurs. The microblunt nerve hook could be passed out the foramina bilaterally at each level.   Meticulous hemostasis was obtained.  A biomechanical device (Globus Hedron 7 mm height x 14 mm width by 12 mm depth) was placed at C5/6. A second biomechanical device (Globus Hedron 7 mm height x 14 mm width by 12 mm depth) was placed at C6/7. Each device had been filled with allograft for aid in arthrodesis.  The caspar distractor was removed, and bone wax used for hemostasis. A separate, 34 mm Nuvasive ACP plate was chosen.  Two screws placed in each vertebral body, respectively making sure the screws were behind the locking mechanism.  Final AP and lateral radiographs were taken.   Please note that the plate is not inclusive to the biomechanical devices.  The anchoring mechanism of the plate is completely separate from the biomechanical devices.  With everything in good position, the wound was irrigated copiously and meticulous hemostasis obtained.  Wound was closed in 2 layers using interrupted inverted 3-0 Vicryl sutures in the platysma and 3-0 monocryl on the dermis.  The wound was dressed with dermabond, the head of bed at 30 degrees, taken to recovery room in stable condition.  No new postop neurological deficits were identified.  Sponge and pattie counts were correct at  the end of the procedure.   Monitoring was stable throughout.  I performed the entire procedure with the assistance of  Cooper Render PA as an Pensions consultant. An assistant was required for this procedure due to the complexity.  The assistant provided assistance in tissue manipulation and suction, and was required for the successful and safe performance of the procedure. I performed the critical portions of the procedure.   Meade Maw MD

## 2022-11-14 NOTE — Transfer of Care (Signed)
Immediate Anesthesia Transfer of Care Note  Patient: Morgan Stevens  Procedure(s) Performed: C5-7 ANTERIOR CERVICAL DISCECTOMY AND FUSION (NUVASIVE ACP, GLOBUS HEDRON) (Spine Cervical)  Patient Location: PACU  Anesthesia Type:General  Level of Consciousness: awake, drowsy, and patient cooperative  Airway & Oxygen Therapy: Patient Spontanous Breathing  Post-op Assessment: Report given to RN and Post -op Vital signs reviewed and stable  Post vital signs: Reviewed and stable  Last Vitals:  Vitals Value Taken Time  BP 138/80 11/14/22 0930  Temp    Pulse 103 11/14/22 0932  Resp 22 11/14/22 0932  SpO2 98 % 11/14/22 0932  Vitals shown include unvalidated device data.  Last Pain:  Vitals:   11/14/22 0636  TempSrc: Temporal  PainSc: 0-No pain      Patients Stated Pain Goal: 0 ([id number])  Complications: No notable events documented.

## 2022-11-14 NOTE — Telephone Encounter (Signed)
Mr Schall called on behalf of Morgan Stevens. He reports a drop of blood in the middle of the incision and some clear fluid from the middle towards the edge on one side. I have asked him to send a mychart message with a picture.

## 2022-11-14 NOTE — Discharge Summary (Signed)
Discharge Summary  Patient ID: Morgan Stevens MRN: [id number] DOB/AGE: 12/30/1965 57 y.o.  Admit date: 11/14/2022 Discharge date: 11/14/2022  Admission Diagnoses: G95.9 cervical myelopathy, M48.02 cervical stenosis   Discharge Diagnoses:  Active Problems:   Myelopathy ([place])   Cervical spinal stenosis   Discharged Condition: good  Hospital Course:  Morgan Stevens is a 57 y.o presenting with cervical stenosis and symptoms concerning for cervical myelopathy.  She underwent a C4-5 and C6-7 ACDF.  Her intraoperative course was uncomplicated and she was monitored in PACU for a minimum of 4 hours.  She was discharged home after ambulating, urinating, and tolerating p.o. intake.  She was given medications for muscle laxation, stool softener, and pain medications.  Consults: None  Significant Diagnostic Studies: none  Treatments: surgery: As above.  Please see separately dictated operative report for further details.  Discharge Exam: Blood pressure (!) 148/97, pulse 86, temperature (!) 97.1 F (36.2 C), temperature source Temporal, resp. rate 14, height 5\' 4"  (1.626 m), weight 110.3 kg, last menstrual period 07/11/2020, SpO2 97 %. CN II-XII grossly intact MAEW Incision c/d/i  Disposition: Discharge disposition: 01-Home or Self Care        Allergies as of 11/14/2022   No Known Allergies      Medication List     STOP taking these medications    Baclofen 5 MG Tabs       TAKE these medications    acetaminophen 500 MG tablet Commonly known as: TYLENOL Take 2 tablets (1,000 mg total) by mouth every 8 (eight) hours.   amLODipine 5 MG tablet Commonly known as: NORVASC Take 1 tablet (5 mg total) by mouth daily.   aspirin EC 81 MG tablet Take 81 mg by mouth at bedtime. Swallow whole.   atorvastatin 20 MG tablet Commonly known as: LIPITOR TAKE 1 TABLET BY MOUTH AT BEDTIME   Euthyrox 125 MCG tablet Generic drug: levothyroxine Take 1 tablet (125 mcg total) by mouth  daily before breakfast.   famotidine 20 MG tablet Commonly known as: PEPCID Take 20 mg by mouth at bedtime.   fluticasone 50 MCG/ACT nasal spray Commonly known as: FLONASE Place 2 sprays into both nostrils daily. Use for 4-6 weeks then stop and use seasonally or as needed. What changed:  when to take this reasons to take this   gabapentin 300 MG capsule Commonly known as: NEURONTIN Take 1 capsule (300 mg total) by mouth 2 (two) times daily.   methocarbamol 500 MG tablet Commonly known as: ROBAXIN Take 1 tablet (500 mg total) by mouth 4 (four) times daily.   montelukast 10 MG tablet Commonly known as: SINGULAIR TAKE 1 TABLET BY MOUTH AT BEDTIME   oxyCODONE 5 MG immediate release tablet Commonly known as: Roxicodone Take 1 tablet (5 mg total) by mouth every 4 (four) hours as needed for severe pain.   pantoprazole 40 MG tablet Commonly known as: PROTONIX Take 1 tablet (40 mg total) by mouth 2 (two) times daily before a meal.   senna 8.6 MG Tabs tablet Commonly known as: SENOKOT Take 1 tablet (8.6 mg total) by mouth daily as needed for mild constipation.   TUMS GAS RELIEF CHEWY BITES PO Take by mouth as needed.   Ventolin HFA 108 (90 Base) MCG/ACT inhaler Generic drug: albuterol INHALE 2 PUFFS BY MOUTH EVERY 4 HOURS AS NEEDED FOR WHEEZING FOR SHORTNESS OF BREATH        Follow-up Information     Loleta Dicker, PA Follow up on 11/29/2022.  Specialty: Neurosurgery Contact information: [place] [address] [phone]                 Signed: Loleta Dicker 11/14/2022, 9:42 AM

## 2022-11-14 NOTE — H&P (Signed)
Referring Physician:  No referring provider defined for this encounter.  Primary Physician:  Morgan Hauser, DO  History of Present Illness: 11/14/2022 Morgan Stevens presents today with continued symptoms.    09/25/2022 [name] is here today with a chief complaint of neck pain into her arms bilaterally.  She has numbness and weakness in her left arm.  This began approximately 2 weeks ago.  She has not had any trips or falls.   She has had intermittent episodes of this over the past 2 years, but worsening recently.  Her pain is currently aching and is about a 6 out of 10.  Nothing is really helped.  Bowel/Bladder Dysfunction: none  Conservative measures:  Physical therapy:  has not participated Multimodal medical therapy including regular antiinflammatories:  prednisone, tylenol, gabapentin Injections:  has not received epidural steroid injections  Past Surgery: none  Morgan Stevens has symptoms of cervical myelopathy.  The symptoms are causing a significant impact on the patient's life.   I have utilized the care everywhere function in epic to review the outside records available from external health systems.  Telemedicine visit with Morgan Render, PA-C on 02/13/22: I spoke with [name] and discussed her MRI results. She does have neuroforaminal stenosis on the right that may explain her intrascapular and right radiating arm pain however she just underwent shoulder surgery about a week ago and recovering from this. She is currently in physical therapy but this for her shoulder. We discussed further treatment of her cervical symptoms however she needs to be cleared from orthopedics to move forward with this. She will contact us once she has finished her rehab for her shoulder and has been cleared by orthopedics we will move forward with physical therapy for her neck. She was encouraged to call our office in the meantime with any questions or concerns. We will  otherwise see her going forward on an as-needed basis. She expressed understanding was in agreement with this plan.   Office visit with Morgan Render, PA-C on 01/04/22: Morgan Stevens is a 57 y.o. female who presents with the chief complaint of recurrent and worsening interscapular pain. She also endorses occasional tingling down her right arm into all 5 fingers. She does have similar symptoms in the left arm however she has always attributed this to her shoulder pathology. She denies any radiating arm pain, difficulty with dexterity, or gait disturbance. She is not currently taking medications for neck pain and has not recently undergone any injections or physical therapy.  Office visit with Dr. Izora Stevens 12/08/20: Morgan Stevens is here today with a chief complaint of right facial numbness and tingling in right arm and hand. This has recently resolved. She was thought to have transient ischemic attack. She was found to have some cervical disc disease, so was referred for follow-up. She is currently having no symptoms.    Review of Systems:  A 10 point review of systems is negative, except for the pertinent positives and negatives detailed in the HPI.  Past Medical History: Past Medical History:  Diagnosis Date   Anemia    Chronic headaches    several per week   Gastritis    GERD (gastroesophageal reflux disease)    Hiatal hernia    Hyperlipidemia    Hypertension    Hypothyroidism    Obesity (BMI 30-39.9)    Pre-diabetes    Reflux    Spinal stenosis    Thyroid disease    Vertigo  rare   Wears contact lenses     Past Surgical History: Past Surgical History:  Procedure Laterality Date   CARDIAC CATHETERIZATION  02/27/2016   Procedure: Left Heart Cath and Coronary Angiography;  Surgeon: Morgan Booze, MD;  Location: [facility];  Service: Cardiovascular;;   CESAREAN SECTION     CHOLECYSTECTOMY     COLONOSCOPY WITH PROPOFOL N/A 04/15/2019   Procedure: COLONOSCOPY  WITH PROPOFOL;  Surgeon: Morgan Manifold, MD;  Location: [facility];  Service: Endoscopy;  Laterality: N/A;   ESOPHAGOGASTRODUODENOSCOPY N/A 11/05/2022   Procedure: ESOPHAGOGASTRODUODENOSCOPY (EGD);  Surgeon: Morgan Helling, DO;  Location[facility];  Service: Gastroenterology;  Laterality: N/A;   ESOPHAGOGASTRODUODENOSCOPY (EGD) WITH PROPOFOL N/A 04/15/2019   Procedure: ESOPHAGOGASTRODUODENOSCOPY (EGD) WITH PROPOFOL;  Surgeon: Morgan Manifold, MD;  Location: [facility] ENDOSCOPY;  Service: Endoscopy;  Laterality: N/A;   SHOULDER ARTHROSCOPY WITH DEBRIDEMENT AND BICEP TENDON REPAIR Left 02/06/2022   Procedure: Left shoulder arthroscopic debridement, subacromial decompression, open subpectoral biceps tenodesis, and  Regeneten Patch application;  Surgeon: Morgan Fabry, MD;  Location: [facility];  Service: Orthopedics;  Laterality: Left;   THYROIDECTOMY  2018   THYROIDECTOMY      Allergies: Allergies as of 10/05/2022   (No Known Allergies)    Medications: Current Meds  Medication Sig   acetaminophen (TYLENOL) 500 MG tablet Take 2 tablets (1,000 mg total) by mouth every 8 (eight) hours.   amLODipine (NORVASC) 5 MG tablet Take 1 tablet (5 mg total) by mouth daily.   atorvastatin (LIPITOR) 20 MG tablet TAKE 1 TABLET BY MOUTH AT BEDTIME   Baclofen 5 MG TABS TAKE 1 TO 2 TABLETS BY MOUTH THREE TIMES DAILY AS NEEDED FOR MUSCLE SPASM   Calcium Carbonate-Simethicone (TUMS GAS RELIEF CHEWY BITES PO) Take by mouth as needed.   EUTHYROX 125 MCG tablet Take 1 tablet (125 mcg total) by mouth daily before breakfast.   famotidine (PEPCID) 20 MG tablet Take 20 mg by mouth at bedtime.   fluticasone (FLONASE) 50 MCG/ACT nasal spray Place 2 sprays into both nostrils daily. Use for 4-6 weeks then stop and use seasonally or as needed. (Patient taking differently: Place 2 sprays into both nostrils as needed. Use for 4-6 weeks then stop and use seasonally or as needed.)   gabapentin  (NEURONTIN) 300 MG capsule Take 1 capsule (300 mg total) by mouth 2 (two) times daily.   montelukast (SINGULAIR) 10 MG tablet TAKE 1 TABLET BY MOUTH AT BEDTIME   pantoprazole (PROTONIX) 40 MG tablet Take 1 tablet (40 mg total) by mouth 2 (two) times daily before a meal.   VENTOLIN HFA 108 (90 Base) MCG/ACT inhaler INHALE 2 PUFFS BY MOUTH EVERY 4 HOURS AS NEEDED FOR WHEEZING FOR SHORTNESS OF BREATH    Social History: Social History   Tobacco Use   Smoking status: Never   Smokeless tobacco: Never  Vaping Use   Vaping Use: Never used  Substance Use Topics   Alcohol use: No    Alcohol/week: 0.0 standard drinks of alcohol   Drug use: No    Family Medical History: Family History  Problem Relation Age of Onset   Congestive Heart Failure Mother    Breast cancer Neg Hx     Physical Examination: Vitals:   11/14/22 0636  BP: (!) 148/97  Pulse: 86  Resp: 14  Temp: (!) 97.1 F (36.2 C)  SpO2: 97%   Heart sounds normal no MRG. Chest Clear to Auscultation Bilaterally.  General: Patient is  well developed, well nourished, calm, collected, and in no apparent distress. Attention to examination is appropriate.  Neck:   Supple.  Full range of motion.  Respiratory: Patient is breathing without any difficulty.   NEUROLOGICAL:     Awake, alert, oriented to person, place, and time.  Speech is clear and fluent.   Cranial Nerves: Pupils equal round and reactive to light.  Facial tone is symmetric.  Facial sensation is symmetric. Shoulder shrug is symmetric. Tongue protrusion is midline.  There is no pronator drift.  ROM of spine: full.    Strength: Side Biceps Triceps Deltoid Interossei Grip Wrist Ext. Wrist Flex.  R 5 5 5 5 5 5 5   L 5 5 4+ 4+ 4+ 5 5   Side Iliopsoas Quads Hamstring PF DF EHL  R 5 5 5 5 5 5   L 5 5 5 5 5 5    Reflexes are 2+ and symmetric at the biceps, triceps, brachioradialis, patella and achilles.   Hoffman's is present.   Bilateral upper and lower extremity  sensation is intact to light touch.    No evidence of dysmetria noted.  Gait is wide-based.  She has moderate difficulty with tandem gait.     Medical Decision Making  Imaging: MRI C spine 09/09/2022 IMPRESSION: 1. No significant interval change in multilevel cervical spondylosis, as detailed above. 2. Moderate-severe canal stenosis at C5-C6 and moderate canal stenosis at C6-C7. 3. Mild canal stenosis at C3-C4 and C4-C5. 4. Mild left foraminal stenosis at C5-C6. Mild right foraminal stenosis at C4-C5 and C6-C7.     Electronically Signed   By: Davina Poke D.O.   On: 09/09/2022 16:01  I have personally reviewed the images and agree with the above interpretation.  Assessment and Plan: Morgan Stevens is a pleasant 57 y.o. 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  She has abnormal balance and abnormal reflexes.  She has had progressive symptoms over the past 2 years.  We will proceed with C5-7 anterior cervical discectomy and fusion.      Desarie Feild K. Morgan Ribas MD, [facility] Neurosurgery

## 2022-11-14 NOTE — Discharge Instructions (Addendum)
Your surgeon has performed an operation on your cervical spine (neck) to relieve pressure on the spinal cord and/or nerves. This involved making an incision in the front of your neck and removing one or more of the discs that support your spine. Next, a small piece of bone, a titanium plate, and screws were used to fuse two or more of the vertebrae (bones) together.  The following are instructions to help in your recovery once you have been discharged from the hospital. Even if you feel well, it is important that you follow these activity guidelines. If you do not let your neck heal properly from the surgery, you can increase the chance of return of your symptoms and other complications.  * Do not take anti-inflammatory medications for 3 months after surgery (naproxen [Aleve], ibuprofen [Advil, Motrin], etc.). These medications can prevent your bones from healing properly.  Celebrex, if prescribed, is ok to take.  Activity    No bending, lifting, or twisting ("BLT"). Avoid lifting objects heavier than 10 pounds (gallon milk jug).  Where possible, avoid household activities that involve lifting, bending, reaching, pushing, or pulling such as laundry, vacuuming, grocery shopping, and childcare. Try to arrange for help from friends and family for these activities while your back heals.  Increase physical activity slowly as tolerated.  Taking short walks is encouraged, but avoid strenuous exercise. Do not jog, run, bicycle, lift weights, or participate in any other exercises unless specifically allowed by your doctor.  Talk to your doctor before resuming sexual activity.  You should not drive until cleared by your doctor.  Until released by your doctor, you should not return to work or school.  You should rest at home and let your body heal.   You may shower three days after your surgery.  After showering, lightly dab your incision dry. Do not take a tub bath or go swimming until approved by your  doctor at your follow-up appointment.  If your doctor ordered a cervical collar (neck brace) for you, you should wear it whenever you are out of bed. You may remove it when lying down or sleeping, but you should wear it at all other times. Not all neck surgeries require a cervical collar.  If you smoke, we strongly recommend that you quit.  Smoking has been proven to interfere with normal bone healing and will dramatically reduce the success rate of your surgery. Please contact [facility] (800-QUIT-NOW) and use the resources at www.[facility].com for assistance in stopping smoking.  Surgical Incision   If you have a dressing on your incision, you may remove it two days after your surgery. Keep your incision area clean and dry.  If you have staples or stitches on your incision, you should have a follow up scheduled for removal. If you do not have staples or stitches, you will have steri-strips (small pieces of surgical tape) or Dermabond glue. The steri-strips/glue should begin to peel away within about a week (it is fine if the steri-strips fall off before then). If the strips are still in place one week after your surgery, you may gently remove them.  Diet           You may return to your usual diet. However, you may experience discomfort when swallowing in the first month after your surgery. This is normal. You may find that softer foods are more comfortable for you to swallow. Be sure to stay hydrated.  When to Contact Us  You may experience pain in your   neck and/or pain between your shoulder blades. This is normal and should improve in the next few weeks with the help of pain medication, muscle relaxers, and rest. Some patients report that a warm compress on the back of the neck or between the shoulder blades helps.  However, should you experience any of the following, contact us immediately: New numbness or weakness Pain that is progressively getting worse, and is not relieved by your pain  medication, muscle relaxers, rest, and warm compresses Bleeding, redness, swelling, pain, or drainage from surgical incision Chills or flu-like symptoms Fever greater than 101.0 F (38.3 C) Inability to eat, drink fluids, or take medications Problems with bowel or bladder functions Difficulty breathing or shortness of breath Warmth, tenderness, or swelling in your calf Contact Information During office hours (Monday-Friday 9 am to 5 pm), please call your physician at [phone] and ask for Berdine Addison After hours and weekends, please call [phone] and speak with the neurosurgeon on call For a life-threatening emergency, call [place]   The drugs that you were given will stay in your system until tomorrow so for the next 24 hours you should not:  Drive an automobile Make any legal decisions Drink any alcoholic beverage   You may resume regular meals tomorrow.  Today it is better to start with liquids and gradually work up to solid foods.  You may eat anything you prefer, but it is better to start with liquids, then soup and crackers, and gradually work up to solid foods.   Please notify your doctor immediately if you have any unusual bleeding, trouble breathing, redness and pain at the surgery site, drainage, fever, or pain not relieved by medication.   Your post-operative visit with Dr.                                       is: Date:                        Time:    Please call to schedule your post-operative visit.  Additional Instructions:

## 2022-11-14 NOTE — Progress Notes (Cosign Needed Addendum)
Mrs Morgan Stevens was observed in office by this RN for about 1 hour. No active bleeding or drainage was noted during this time. Dry dressing was applied by Dr Izora Ribas. Mrs Morgan Stevens was instructed to keep this on until Friday and to contact us if it becomes saturated. She confirmed that she has the after hours number for urgent concerns.

## 2022-11-14 NOTE — Telephone Encounter (Signed)
Called into OR to discuss with Dr Izora Ribas. We will have her come in to the office now.

## 2022-11-14 NOTE — Anesthesia Postprocedure Evaluation (Signed)
Anesthesia Post Note  Patient: Morgan Stevens  Procedure(s) Performed: C5-7 ANTERIOR CERVICAL DISCECTOMY AND FUSION (NUVASIVE ACP, GLOBUS HEDRON) (Spine Cervical)  Patient location during evaluation: PACU Anesthesia Type: General Level of consciousness: awake and alert, oriented and patient cooperative Pain management: pain level controlled Vital Signs Assessment: post-procedure vital signs reviewed and stable Respiratory status: spontaneous breathing, nonlabored ventilation and respiratory function stable Cardiovascular status: blood pressure returned to baseline and stable Postop Assessment: adequate PO intake Anesthetic complications: no   No notable events documented.   Last Vitals:  Vitals:   11/14/22 1031 11/14/22 1049  BP: 121/71 127/72  Pulse: 90 85  Resp: 12 18  Temp: 36.5 C (!) 36.1 C  SpO2: 99% 98%    Last Pain:  Vitals:   11/14/22 1049  TempSrc: Temporal  PainSc: 2                  Darrin Nipper

## 2022-11-14 NOTE — Anesthesia Procedure Notes (Addendum)
Procedure Name: Intubation Date/Time: 11/14/2022 7:24 AM  Performed by: Kelton Pillar, CRNAPre-anesthesia Checklist: Patient identified, Emergency Drugs available, Suction available and Patient being monitored Patient Re-evaluated:Patient Re-evaluated prior to induction Oxygen Delivery Method: Circle system utilized Preoxygenation: Pre-oxygenation with 100% oxygen Induction Type: IV induction Ventilation: Mask ventilation without difficulty Laryngoscope Size: McGraph and 3 Grade View: Grade I Tube type: Oral Tube size: 7.0 mm Number of attempts: 1 Airway Equipment and Method: Stylet, Oral airway and Bite block Placement Confirmation: ETT inserted through vocal cords under direct vision, positive ETCO2, breath sounds checked- equal and bilateral and CO2 detector Secured at: 21 cm Tube secured with: Tape Dental Injury: Teeth and Oropharynx as per pre-operative assessment  Comments: Atraumatic, bite block inserted w/o incident TFH CRNA

## 2022-11-14 NOTE — Anesthesia Preprocedure Evaluation (Addendum)
Anesthesia Evaluation  Patient identified by MRN, date of birth, ID band Patient awake    Reviewed: Allergy & Precautions, NPO status , Patient's Chart, lab work & pertinent test results  History of Anesthesia Complications Negative for: history of anesthetic complications  Airway Mallampati: I   Neck ROM: Full    Dental  (+) Missing   Pulmonary neg pulmonary ROS   Pulmonary exam normal breath sounds clear to auscultation       Cardiovascular hypertension, Normal cardiovascular exam Rhythm:Regular Rate:Normal  ECG 09/09/22: NSR   Neuro/Psych  Headaches Vertigo     GI/Hepatic hiatal hernia,GERD  ,,  Endo/Other  Hypothyroidism  Class 3 obesity; prediabetes  Renal/GU negative Renal ROS     Musculoskeletal   Abdominal   Peds  Hematology negative hematology ROS (+)   Anesthesia Other Findings Cardiology note 11/12/22:  1. Dyspnea on exertion, chest pain appears noncardiac.  Dyspnea likely from morbid obesity.  Echo with normal EF 60 to 65%, impaired relaxation.  Coronary CT in 2021 no CAD.  Continue to monitor for now.  Consider repeat coronary CTA if symptoms persist. 2. Hypertension, BP not at goal.  Increase Norvasc to 5 mg daily.  Low-salt diet advised. 3. Morbid obesity, low-calorie diet, weight loss advised.   Follow-up in 3 months.   Reproductive/Obstetrics                             Anesthesia Physical Anesthesia Plan  ASA: 3  Anesthesia Plan: General   Post-op Pain Management:    Induction: Intravenous  PONV Risk Score and Plan: 3 and Ondansetron, Dexamethasone and Treatment may vary due to age or medical condition  Airway Management Planned: Oral ETT  Additional Equipment:   Intra-op Plan:   Post-operative Plan: Extubation in OR  Informed Consent: I have reviewed the patients History and Physical, chart, labs and discussed the procedure including the risks, benefits  and alternatives for the proposed anesthesia with the patient or authorized representative who has indicated his/her understanding and acceptance.     Dental advisory given  Plan Discussed with: CRNA  Anesthesia Plan Comments: (Patient consented for risks of anesthesia including but not limited to:  - adverse reactions to medications - damage to eyes, teeth, lips or other oral mucosa - nerve damage due to positioning  - sore throat or hoarseness - damage to heart, brain, nerves, lungs, other parts of body or loss of life  Informed patient about role of CRNA in peri- and intra-operative care.  Patient voiced understanding.)        Anesthesia Quick Evaluation

## 2022-11-15 ENCOUNTER — Encounter: Payer: Self-pay | Admitting: Neurosurgery

## 2022-11-15 NOTE — Progress Notes (Signed)
Ms. Bohart returns with concerns about her incision.  She had a small area of clotting.  It was not actively bleeding.  It was dressed and she was discharged from clinic.

## 2022-11-26 ENCOUNTER — Other Ambulatory Visit: Payer: Medicaid Other

## 2022-11-28 NOTE — Progress Notes (Unsigned)
   REFERRING PHYSICIAN:  Andee Poles [place],  [place] [id number]  DOS: 11/14/22 C5-7 ACDF  HISTORY OF PRESENT ILLNESS: Morgan Stevens is 2 weeks status post ACDF. Overall, she is doing very well postoperatively.  She denies any swallowing concerns or concerns regarding her incision.  She reports resolution of her preoperative arm pain and improvement of her left arm weakness.  PHYSICAL EXAMINATION:  NEUROLOGICAL:  General: In no acute distress.   Awake, alert, oriented to person, place, and time.  Pupils equal round and reactive to light.  Facial tone is symmetric.   Strength: Side Biceps Triceps Deltoid Interossei Grip Wrist Ext. Wrist Flex.  R 5 5 5 5 5 5 5   L 5 5 5 5 5 5 5    Incision c/d/I and healing well  Imaging:  No interval imaging to review   Assessment / Plan: Morgan Stevens is doing well after ACDF.  She is not currently taking any narcotic pain medication.  We discussed activity escalation and I have advised the patient to lift up to 10 pounds until 6 weeks after surgery, then increase up to 25 pounds until 12 weeks after surgery.  After 12 weeks post-op, the patient advised to increase activity as tolerated. she will return to clinic in approximately 4 weeks with Dr. Izora Ribas with xrays prior.   Advised to contact the office if any questions or concerns arise.   Cooper Render PA-C Dept of Neurosurgery

## 2022-11-29 ENCOUNTER — Ambulatory Visit (INDEPENDENT_AMBULATORY_CARE_PROVIDER_SITE_OTHER): Payer: Medicaid Other | Admitting: Neurosurgery

## 2022-11-29 ENCOUNTER — Encounter: Payer: Self-pay | Admitting: Neurosurgery

## 2022-11-29 VITALS — BP 126/80 | Temp 97.7°F | Ht 64.0 in | Wt 243.0 lb

## 2022-11-29 DIAGNOSIS — Z981 Arthrodesis status: Secondary | ICD-10-CM

## 2022-11-29 DIAGNOSIS — G959 Disease of spinal cord, unspecified: Secondary | ICD-10-CM

## 2022-11-29 DIAGNOSIS — Z09 Encounter for follow-up examination after completed treatment for conditions other than malignant neoplasm: Secondary | ICD-10-CM

## 2022-11-29 DIAGNOSIS — M4802 Spinal stenosis, cervical region: Secondary | ICD-10-CM

## 2022-11-30 ENCOUNTER — Encounter: Payer: Medicaid Other | Admitting: Family Medicine

## 2022-12-07 ENCOUNTER — Other Ambulatory Visit: Payer: Self-pay

## 2022-12-07 DIAGNOSIS — Z0000 Encounter for general adult medical examination without abnormal findings: Secondary | ICD-10-CM

## 2022-12-07 DIAGNOSIS — E890 Postprocedural hypothyroidism: Secondary | ICD-10-CM

## 2022-12-07 DIAGNOSIS — E7800 Pure hypercholesterolemia, unspecified: Secondary | ICD-10-CM

## 2022-12-10 ENCOUNTER — Other Ambulatory Visit: Payer: Medicaid Other

## 2022-12-10 DIAGNOSIS — E890 Postprocedural hypothyroidism: Secondary | ICD-10-CM | POA: Diagnosis not present

## 2022-12-10 DIAGNOSIS — Z0000 Encounter for general adult medical examination without abnormal findings: Secondary | ICD-10-CM | POA: Diagnosis not present

## 2022-12-10 DIAGNOSIS — E7800 Pure hypercholesterolemia, unspecified: Secondary | ICD-10-CM | POA: Diagnosis not present

## 2022-12-11 LAB — COMPLETE METABOLIC PANEL WITH GFR
AG Ratio: 1.4 (calc) (ref 1.0–2.5)
ALT: 34 U/L — ABNORMAL HIGH (ref 6–29)
AST: 16 U/L (ref 10–35)
Albumin: 4 g/dL (ref 3.6–5.1)
Alkaline phosphatase (APISO): 52 U/L (ref 37–153)
BUN: 14 mg/dL (ref 7–25)
CO2: 27 mmol/L (ref 20–32)
Calcium: 9.2 mg/dL (ref 8.6–10.4)
Chloride: 105 mmol/L (ref 98–110)
Creat: 0.83 mg/dL (ref 0.50–1.03)
Globulin: 2.8 g/dL (calc) (ref 1.9–3.7)
Glucose, Bld: 136 mg/dL — ABNORMAL HIGH (ref 65–99)
Potassium: 3.8 mmol/L (ref 3.5–5.3)
Sodium: 142 mmol/L (ref 135–146)
Total Bilirubin: 0.6 mg/dL (ref 0.2–1.2)
Total Protein: 6.8 g/dL (ref 6.1–8.1)
eGFR: 84 mL/min/{1.73_m2} (ref >=60)

## 2022-12-11 LAB — T4, FREE: Free T4: 1.2 ng/dL (ref 0.8–1.8)

## 2022-12-11 LAB — TSH: TSH: 0.81 mIU/L

## 2022-12-11 LAB — CBC WITH DIFFERENTIAL/PLATELET
Absolute Monocytes: 386 cells/uL (ref 200–950)
Basophils Absolute: 67 cells/uL (ref 0–200)
Basophils Relative: 1.2 %
Eosinophils Absolute: 241 cells/uL (ref 15–500)
Eosinophils Relative: 4.3 %
HCT: 37.8 % (ref 35.0–45.0)
Hemoglobin: 12.5 g/dL (ref 11.7–15.5)
Lymphs Abs: 2946 cells/uL (ref 850–3900)
MCH: 30 pg (ref 27.0–33.0)
MCHC: 33.1 g/dL (ref 32.0–36.0)
MCV: 90.6 fL (ref 80.0–100.0)
MPV: 10.4 fL (ref 7.5–12.5)
Monocytes Relative: 6.9 %
Neutro Abs: 1960 cells/uL (ref 1500–7800)
Neutrophils Relative %: 35 %
Platelets: 304 10*3/uL (ref 140–400)
RBC: 4.17 10*6/uL (ref 3.80–5.10)
RDW: 13.2 % (ref 11.0–15.0)
Total Lymphocyte: 52.6 %
WBC: 5.6 10*3/uL (ref 3.8–10.8)

## 2022-12-11 LAB — LIPID PANEL
Cholesterol: 123 mg/dL (ref <200)
HDL: 44 mg/dL — ABNORMAL LOW (ref >=50)
LDL Cholesterol (Calc): 61 mg/dL (calc)
Non-HDL Cholesterol (Calc): 79 mg/dL (calc) (ref <130)
Total CHOL/HDL Ratio: 2.8 (calc) (ref <5.0)
Triglycerides: 94 mg/dL (ref <150)

## 2022-12-20 DIAGNOSIS — H5213 Myopia, bilateral: Secondary | ICD-10-CM | POA: Diagnosis not present

## 2022-12-25 ENCOUNTER — Encounter: Payer: Self-pay | Admitting: Family Medicine

## 2022-12-25 ENCOUNTER — Ambulatory Visit (INDEPENDENT_AMBULATORY_CARE_PROVIDER_SITE_OTHER): Payer: Medicaid Other | Admitting: Family Medicine

## 2022-12-25 VITALS — BP 118/82 | HR 76 | Ht 64.0 in | Wt 239.0 lb

## 2022-12-25 DIAGNOSIS — Z1231 Encounter for screening mammogram for malignant neoplasm of breast: Secondary | ICD-10-CM | POA: Diagnosis not present

## 2022-12-25 DIAGNOSIS — J3089 Other allergic rhinitis: Secondary | ICD-10-CM | POA: Diagnosis not present

## 2022-12-25 DIAGNOSIS — R053 Chronic cough: Secondary | ICD-10-CM | POA: Diagnosis not present

## 2022-12-25 DIAGNOSIS — R7303 Prediabetes: Secondary | ICD-10-CM

## 2022-12-25 DIAGNOSIS — Z124 Encounter for screening for malignant neoplasm of cervix: Secondary | ICD-10-CM

## 2022-12-25 DIAGNOSIS — J9801 Acute bronchospasm: Secondary | ICD-10-CM | POA: Diagnosis not present

## 2022-12-25 DIAGNOSIS — E7800 Pure hypercholesterolemia, unspecified: Secondary | ICD-10-CM | POA: Diagnosis not present

## 2022-12-25 DIAGNOSIS — Z0000 Encounter for general adult medical examination without abnormal findings: Secondary | ICD-10-CM

## 2022-12-25 DIAGNOSIS — E890 Postprocedural hypothyroidism: Secondary | ICD-10-CM

## 2022-12-25 MED ORDER — MONTELUKAST SODIUM 10 MG PO TABS: 10.0000 mg | ORAL_TABLET | Freq: Every day | ORAL | 3 refills | Status: DC

## 2022-12-25 NOTE — Progress Notes (Addendum)
Subjective:    Patient ID: Morgan Stevens, female    DOB: May 10, 1966, 57 y.o.   MRN: [id number]  Morgan Stevens is a 57 y.o. female presenting on 12/25/2022 for Annual Exam   HPI  Here for Annual Physical and Lab Review   CHRONIC HTN: Current Meds - Amlodipine 5mg  daily Reports good compliance, took meds today. Tolerating well, w/o complaints.  Morbid Obesity BMI >41 Lifestyle: - Diet: limited salt, low caffeine. Mediterranean diet primarily. - Exercise: active, but no regular exercise. Some walking. Goal to improve to cardio exercise equipment.   HYPERLIPIDEMIA: Last labs with Lipid panel LDL 61 improved and TG normalized  - Currently taking Atorvastatin 20mg , tolerating well without side effects or myalgias   Cervical Spine DDD, nerve impingement Left Shoulder Chronic Pain Has followed with Dr Myer Haff Neurosurgery [facility] Taking Flexeril 5mg  PRN with some good results. S/p Cervical spine fusion 10/2022   Hypothyroidism, acquired Initially diagnosed low thyroid in 2017 Last lab 11/2022, shows normal TSH and T4 Continues on Euthyrox daily   GERD, Gastritis / Esophagitis Hiatal Hernia Continues to follow with [facility] Dr Maximino Greenland, has had EGD / Colonoscopy in recent history Taking Famotidine 40mg  BID with good results Has Maalox PRN as well    Additional update She is still healing post op after cervical spine fusion 11/14/22 Dr Myer Haff     Health Maintenance:   UTD Flu Shot   Declines COVID Booster   Breast CA Screening: Due for mammogram screening. Last mammogram result 10/2021 (negative) done at [facility]. No prior history abnormal mammogram. No known family history of breast cancer. Currently asymptomatic.   Colonoscopy completed 04/15/2019, Dr Maximino Greenland, next due in 10 years, or 2030.  Past Surgical History:  Procedure Laterality Date   ANTERIOR CERVICAL DECOMP/DISCECTOMY FUSION N/A 11/14/2022   Procedure: C5-7 ANTERIOR CERVICAL  DISCECTOMY AND FUSION (NUVASIVE ACP, GLOBUS HEDRON);  Surgeon: Venetia Night, MD;  Location: [facility] ORS;  Service: Neurosurgery;  Laterality: N/A;   CARDIAC CATHETERIZATION  02/27/2016   Procedure: Left Heart Cath and Coronary Angiography;  Surgeon: Corky Crafts, MD;  Location: [facility] INVASIVE CV LAB;  Service: Cardiovascular;;   CESAREAN SECTION     CHOLECYSTECTOMY     COLONOSCOPY WITH PROPOFOL N/A 04/15/2019   Procedure: COLONOSCOPY WITH PROPOFOL;  Surgeon: Pasty Spillers, MD;  Location: [facility] ENDOSCOPY;  Service: Endoscopy;  Laterality: N/A;   ESOPHAGOGASTRODUODENOSCOPY N/A 11/05/2022   Procedure: ESOPHAGOGASTRODUODENOSCOPY (EGD);  Surgeon: Jaynie Collins, DO;  Location: [facility] ENDOSCOPY;  Service: Gastroenterology;  Laterality: N/A;   ESOPHAGOGASTRODUODENOSCOPY (EGD) WITH PROPOFOL N/A 04/15/2019   Procedure: ESOPHAGOGASTRODUODENOSCOPY (EGD) WITH PROPOFOL;  Surgeon: Pasty Spillers, MD;  Location: [facility] ENDOSCOPY;  Service: Endoscopy;  Laterality: N/A;   SHOULDER ARTHROSCOPY WITH DEBRIDEMENT AND BICEP TENDON REPAIR Left 02/06/2022   Procedure: Left shoulder arthroscopic debridement, subacromial decompression, open subpectoral biceps tenodesis, and  Regeneten Patch application;  Surgeon: Signa Kell, MD;  Location: [facility];  Service: Orthopedics;  Laterality: Left;   THYROIDECTOMY  2018   THYROIDECTOMY          12/25/2022    3:40 PM 10/19/2022    2:23 PM 10/03/2022    3:10 PM  Depression screen PHQ 2/9  Decreased Interest 2 2 2   Down, Depressed, Hopeless 2 2 3   PHQ - 2 Score 4 4 5   Altered sleeping 2 1 2   Tired, decreased energy 3 3 3   Change in appetite 2 2 1   Feeling bad or failure  about yourself  2 2 2   Trouble concentrating 2 2 1   Moving slowly or fidgety/restless 0 0 0  Suicidal thoughts 0 0 0  PHQ-9 Score 15 14 14   Difficult doing work/chores  Very difficult Very difficult    Past Medical History:  Diagnosis Date   Anemia    Chronic  headaches    several per week   Gastritis    GERD (gastroesophageal reflux disease)    Hiatal hernia    Hyperlipidemia    Hypertension    Hypothyroidism    Obesity (BMI 30-39.9)    Pre-diabetes    Reflux    Spinal stenosis    Thyroid disease    Vertigo    rare   Wears contact lenses    Past Surgical History:  Procedure Laterality Date   ANTERIOR CERVICAL DECOMP/DISCECTOMY FUSION N/A 11/14/2022   Procedure: C5-7 ANTERIOR CERVICAL DISCECTOMY AND FUSION (NUVASIVE ACP, GLOBUS HEDRON);  Surgeon: Venetia Night, MD;  Location: [facility] ORS;  Service: Neurosurgery;  Laterality: N/A;   CARDIAC CATHETERIZATION  02/27/2016   Procedure: Left Heart Cath and Coronary Angiography;  Surgeon: Corky Crafts, MD;  Location: [facility] INVASIVE CV LAB;  Service: Cardiovascular;;   CESAREAN SECTION     CHOLECYSTECTOMY     COLONOSCOPY WITH PROPOFOL N/A 04/15/2019   Procedure: COLONOSCOPY WITH PROPOFOL;  Surgeon: Pasty Spillers, MD;  Location: [facility] ENDOSCOPY;  Service: Endoscopy;  Laterality: N/A;   ESOPHAGOGASTRODUODENOSCOPY N/A 11/05/2022   Procedure: ESOPHAGOGASTRODUODENOSCOPY (EGD);  Surgeon: Jaynie Collins, DO;  Location: [facility] ENDOSCOPY;  Service: Gastroenterology;  Laterality: N/A;   ESOPHAGOGASTRODUODENOSCOPY (EGD) WITH PROPOFOL N/A 04/15/2019   Procedure: ESOPHAGOGASTRODUODENOSCOPY (EGD) WITH PROPOFOL;  Surgeon: Pasty Spillers, MD;  Location: [facility] ENDOSCOPY;  Service: Endoscopy;  Laterality: N/A;   SHOULDER ARTHROSCOPY WITH DEBRIDEMENT AND BICEP TENDON REPAIR Left 02/06/2022   Procedure: Left shoulder arthroscopic debridement, subacromial decompression, open subpectoral biceps tenodesis, and  Regeneten Patch application;  Surgeon: Signa Kell, MD;  Location: [facility];  Service: Orthopedics;  Laterality: Left;   THYROIDECTOMY  2018   THYROIDECTOMY     Social History   Socioeconomic History   Marital status: Married    Spouse name: Mathis Fare   Number of children: 6    Years of education: Not on file   Highest education level: Not on file  Occupational History   Occupation: home maker  Tobacco Use   Smoking status: Never   Smokeless tobacco: Never  Vaping Use   Vaping Use: Never used  Substance and Sexual Activity   Alcohol use: No    Alcohol/week: 0.0 standard drinks of alcohol   Drug use: No   Sexual activity: Not on file  Other Topics Concern   Not on file  Social History Narrative   Home maker, 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   amLODipine (NORVASC) 5 MG tablet  Take 1 tablet (5 mg total) by mouth daily.   aspirin EC 81 MG tablet Take 81 mg by mouth at bedtime. Swallow whole.   atorvastatin (LIPITOR) 20 MG tablet TAKE 1 TABLET BY MOUTH AT BEDTIME   Calcium Carbonate-Simethicone (TUMS GAS RELIEF CHEWY BITES PO) Take by mouth as needed.   EUTHYROX 125 MCG tablet Take 1 tablet (125 mcg total) by mouth daily before breakfast.   famotidine (PEPCID) 20 MG tablet Take 20 mg by mouth at bedtime.   fluticasone (FLONASE) 50 MCG/ACT nasal spray Place 2 sprays into both nostrils daily. Use for 4-6 weeks then stop and use seasonally or as needed. (Patient taking differently: Place 2 sprays into both nostrils as needed. Use for 4-6 weeks then stop and use seasonally or as needed.)   gabapentin (NEURONTIN) 300 MG capsule Take 1 capsule (300 mg total) by mouth 2 (two) times daily.    methocarbamol (ROBAXIN) 500 MG tablet Take 1 tablet (500 mg total) by mouth 4 (four) times daily.   pantoprazole (PROTONIX) 40 MG tablet Take 1 tablet (40 mg total) by mouth 2 (two) times daily before a meal.   senna (SENOKOT) 8.6 MG TABS tablet Take 1 tablet (8.6 mg total) by mouth daily as needed for mild constipation.   VENTOLIN HFA 108 (90 Base) MCG/ACT inhaler INHALE 2 PUFFS BY MOUTH EVERY 4 HOURS AS NEEDED FOR WHEEZING FOR SHORTNESS OF BREATH   No current facility-administered medications on file prior to visit.    Review of Systems  Constitutional:  Negative for activity change, appetite change, chills, diaphoresis, fatigue and fever.  HENT:  Negative for congestion and hearing loss.   Eyes:  Negative for visual disturbance.  Respiratory:  Negative for cough, chest tightness, shortness of breath and wheezing.   Cardiovascular:  Negative for chest pain, palpitations and leg swelling.  Gastrointestinal:  Negative for abdominal pain, constipation, diarrhea, nausea and vomiting.  Genitourinary:  Negative for dysuria, frequency and hematuria.  Musculoskeletal:  Negative for arthralgias and neck pain.  Skin:  Negative for rash.  Neurological:  Negative for dizziness, weakness, light-headedness, numbness and headaches.  Hematological:  Negative for adenopathy.  Psychiatric/Behavioral:  Negative for behavioral problems, dysphoric mood and sleep disturbance.    Per HPI unless specifically indicated above      Objective:    BP 118/82   Pulse 76   Ht 5\' 4"  (1.626 m)   Wt 239 lb (108.4 kg)   LMP 07/11/2020   SpO2 98%   BMI 41.02 kg/m   Wt Readings from Last 3 Encounters:  12/25/22 239 lb (108.4 kg)  11/29/22 243 lb (110.2 kg)  11/14/22 243 lb 2.7 oz (110.3 kg)    Physical Exam Vitals and nursing note reviewed.  Constitutional:      General: She is not in acute distress.    Appearance: She is well-developed. She is obese. She is not diaphoretic.     Comments: Well-appearing,  comfortable, cooperative  HENT:     Head: Normocephalic and atraumatic.  Eyes:     General:        Right eye: No discharge.        Left eye: No discharge.     Conjunctiva/sclera: Conjunctivae normal.     Pupils: Pupils are equal, round, and reactive to light.  Neck:     Thyroid: No thyromegaly.  Cardiovascular:     Rate and Rhythm: Normal rate and regular rhythm.     Pulses: Normal pulses.  Heart sounds: Normal heart sounds. No murmur heard. Pulmonary:     Effort: Pulmonary effort is normal. No respiratory distress.     Breath sounds: Normal breath sounds. No wheezing or rales.  Abdominal:     General: Bowel sounds are normal. There is no distension.     Palpations: Abdomen is soft. There is no mass.     Tenderness: There is no abdominal tenderness.  Musculoskeletal:        General: No tenderness. Normal range of motion.     Cervical back: Normal range of motion and neck supple.     Comments: Upper / Lower Extremities: - Normal muscle tone, strength bilateral upper extremities 5/5, lower extremities 5/5  Lymphadenopathy:     Cervical: No cervical adenopathy.  Skin:    General: Skin is warm and dry.     Findings: No erythema or rash.  Neurological:     Mental Status: She is alert and oriented to person, place, and time.     Comments: Distal sensation intact to light touch all extremities  Psychiatric:        Mood and Affect: Mood normal.        Behavior: Behavior normal.        Thought Content: Thought content normal.     Comments: Well groomed, good eye contact, normal speech and thoughts       Results for orders placed or performed in visit on 12/07/22  T4, free  Result Value Ref Range   Free T4 1.2 0.8 - 1.8 ng/dL  TSH  Result Value Ref Range   TSH 0.81 mIU/L  Lipid panel  Result Value Ref Range   Cholesterol 123 <200 mg/dL   HDL 44 (L) > OR = 50 mg/dL   Triglycerides 94 <865 mg/dL   LDL Cholesterol (Calc) 61 mg/dL (calc)   Total CHOL/HDL Ratio 2.8 <5.0  (calc)   Non-HDL Cholesterol (Calc) 79 <784 mg/dL (calc)  CBC with Differential/Platelet  Result Value Ref Range   WBC 5.6 3.8 - 10.8 Thousand/uL   RBC 4.17 3.80 - 5.10 Million/uL   Hemoglobin 12.5 11.7 - 15.5 g/dL   HCT 69.6 29.5 - 28.4 %   MCV 90.6 80.0 - 100.0 fL   MCH 30.0 27.0 - 33.0 pg   MCHC 33.1 32.0 - 36.0 g/dL   RDW 13.2 44.0 - 10.2 %   Platelets 304 140 - 400 Thousand/uL   MPV 10.4 7.5 - 12.5 fL   Neutro Abs 1,960 1,500 - 7,800 cells/uL   Lymphs Abs 2,946 850 - 3,900 cells/uL   Absolute Monocytes 386 200 - 950 cells/uL   Eosinophils Absolute 241 15 - 500 cells/uL   Basophils Absolute 67 0 - 200 cells/uL   Neutrophils Relative % 35 %   Total Lymphocyte 52.6 %   Monocytes Relative 6.9 %   Eosinophils Relative 4.3 %   Basophils Relative 1.2 %  COMPLETE METABOLIC PANEL WITH GFR  Result Value Ref Range   Glucose, Bld 136 (H) 65 - 99 mg/dL   BUN 14 7 - 25 mg/dL   Creat 7.25 3.66 - 4.40 mg/dL   eGFR 84 > OR = 60 HK/VQQ/5.95G3   BUN/Creatinine Ratio SEE NOTE: 6 - 22 (calc)   Sodium 142 135 - 146 mmol/L   Potassium 3.8 3.5 - 5.3 mmol/L   Chloride 105 98 - 110 mmol/L   CO2 27 20 - 32 mmol/L   Calcium 9.2 8.6 - 10.4 mg/dL   Total Protein 6.8 6.1 -  8.1 g/dL   Albumin 4.0 3.6 - 5.1 g/dL   Globulin 2.8 1.9 - 3.7 g/dL (calc)   AG Ratio 1.4 1.0 - 2.5 (calc)   Total Bilirubin 0.6 0.2 - 1.2 mg/dL   Alkaline phosphatase (APISO) 52 37 - 153 U/L   AST 16 10 - 35 U/L   ALT 34 (H) 6 - 29 U/L      Assessment & Plan:   Problem List Items Addressed This Visit     Allergic rhinitis due to allergen   Relevant Medications   montelukast (SINGULAIR) 10 MG tablet   Morbid obesity (HCC)   Postoperative hypothyroidism    Controlled hypothyroidism On Euthyrox daily      Other Visit Diagnoses     Annual physical exam    -  Primary   Chronic cough       Relevant Medications   montelukast (SINGULAIR) 10 MG tablet   Bronchospasm, acute       Relevant Medications    montelukast (SINGULAIR) 10 MG tablet   Encounter for screening mammogram for malignant neoplasm of breast       Relevant Orders   MM 3D SCREENING MAMMOGRAM BILATERAL BREAST   Cervical cancer screening       Relevant Orders   Ambulatory referral to Obstetrics / Gynecology   Elevated LDL cholesterol level       Pre-diabetes           Updated Health Maintenance information Reviewed recent lab results with patient Encouraged improvement to lifestyle with diet and exercise Goal of weight loss Emphasis on adhering to Mediterranean diet, improving duration of exercise regimen up to goal 150 minutes per week, moderate intensity with walking and cardio exercise as discussed.   Ordered Mammogram. She will schedule.  Referral to [facility] for routine pap smear screening.  A1c 5.9  Continue statin therapy for lipids  Discussion on future consideration of weight management GLP therapy. She will check into cost coverage.   Orders Placed This Encounter  Procedures   MM 3D SCREENING MAMMOGRAM BILATERAL BREAST    Standing Status:   Future    Standing Expiration Date:   12/25/2023    Order Specific Question:   Reason for Exam (SYMPTOM  OR DIAGNOSIS REQUIRED)    Answer:   Screening bilateral 3D Mammogram Tomo    Order Specific Question:   Preferred imaging location?    Answer:   [facility]   Ambulatory referral to Obstetrics / Gynecology    Referral Priority:   Routine    Referral Type:   Consultation    Referral Reason:   Specialty Services Required    Requested Specialty:   Obstetrics and Gynecology    Number of Visits Requested:   1     Meds ordered this encounter  Medications   montelukast (SINGULAIR) 10 MG tablet    Sig: Take 1 tablet (10 mg total) by mouth at bedtime.    Dispense:  90 tablet    Refill:  3      Follow up plan: Return in about 6 months (around 06/26/2023) for 6 month PreDM A1c, Weight management.  Saralyn Pilar, DO [facility] 12/25/2022, 3:47 PM

## 2022-12-25 NOTE — Assessment & Plan Note (Signed)
Controlled hypothyroidism On Euthyrox daily

## 2022-12-25 NOTE — Patient Instructions (Addendum)
Thank you for coming to the office today.   Call insurance find cost and coverage of the following - check the following: - Drug Tier, Preferred List, On Formulary - All will require a "Prior Authorization" from [place] first, before you can find out the cost - Find out if there is "Step Therapy" (other medicines required before you can try these)  Once you pick the one you want to try, let me know - we can get a sample ready IF we have it in stock. Then try it - and before running out of medicine, contact me back to order your Rx so we have time to get it processed.  For Weight Loss / Obesity only  Wegovy (same as Ozempic) weekly injection - start 0.25mg  weekly, 1 dose per pen, single use, auto-injector  2. Saxenda - DAILY injection - start 0.6mg  injection DAILY, you can increase the dose by 1 notch or 0.6 mg per week, if you don't tolerate a dose, can reduce it the next day.  3. Zepbound - weekly injection  3. Contrave - oral medication, appetite suppression has wellbutrin/bupropion and naltrexone in it and it can also help with appetite, it is ordered through a speciality pharmacy.    Future make sure your insurance has weight loss coverage  WEIGHT MANAGEMENT  Dr Quillian Quince  [facility] [address] [place], [place] [address] Ph: [phone]   ------------------  Refilled Singulair.  Referral for Women's Health screening / pap smear.  [facility] at [facility] [address] [place],  [place]  [address] Main: [phone]  For Mammogram screening for breast cancer   Call the Imaging Center below anytime to schedule your own appointment now that order has been placed.  [facility] at [facility] [address] [place], [place] [address] Phone: [phone]    Please schedule a Follow-up Appointment to: Return in about 6 months (around 06/26/2023) for 6  month PreDM A1c, Weight management.  If you have any other questions or concerns, please feel free to call the office or send a message through MyChart. You may also schedule an earlier appointment if necessary.  Additionally, you may be receiving a survey about your experience at our office within a few days to 1 week by e-mail or mail. We value your feedback.  Saralyn Pilar, DO [facility], [place]

## 2022-12-26 ENCOUNTER — Other Ambulatory Visit: Payer: Self-pay

## 2022-12-26 ENCOUNTER — Encounter: Payer: Self-pay | Admitting: Family Medicine

## 2022-12-26 DIAGNOSIS — G959 Disease of spinal cord, unspecified: Secondary | ICD-10-CM

## 2022-12-27 ENCOUNTER — Encounter: Payer: Self-pay | Admitting: Obstetrics and Gynecology

## 2022-12-27 ENCOUNTER — Ambulatory Visit
Admission: RE | Admit: 2022-12-27 | Discharge: 2022-12-27 | Disposition: A | Discharge status: Home / Self Care | Payer: Medicaid Other | Source: Ambulatory Visit | Attending: Neurosurgery | Admitting: Neurosurgery

## 2022-12-27 ENCOUNTER — Encounter: Payer: Self-pay | Admitting: Neurosurgery

## 2022-12-27 ENCOUNTER — Other Ambulatory Visit (HOSPITAL_COMMUNITY)
Admission: RE | Admit: 2022-12-27 | Discharge: 2022-12-27 | Disposition: A | Discharge status: Home / Self Care | Payer: Medicaid Other | Source: Ambulatory Visit | Attending: Obstetrics and Gynecology | Admitting: Obstetrics and Gynecology

## 2022-12-27 ENCOUNTER — Ambulatory Visit: Payer: Medicaid Other | Admitting: Obstetrics and Gynecology

## 2022-12-27 ENCOUNTER — Ambulatory Visit (INDEPENDENT_AMBULATORY_CARE_PROVIDER_SITE_OTHER): Payer: Medicaid Other | Admitting: Neurosurgery

## 2022-12-27 VITALS — BP 134/84 | Temp 98.5°F | Ht 64.0 in | Wt 242.0 lb

## 2022-12-27 VITALS — BP 130/80 | Ht 64.0 in | Wt 242.0 lb

## 2022-12-27 DIAGNOSIS — Z1151 Encounter for screening for human papillomavirus (HPV): Secondary | ICD-10-CM | POA: Insufficient documentation

## 2022-12-27 DIAGNOSIS — G959 Disease of spinal cord, unspecified: Secondary | ICD-10-CM

## 2022-12-27 DIAGNOSIS — Z124 Encounter for screening for malignant neoplasm of cervix: Secondary | ICD-10-CM | POA: Insufficient documentation

## 2022-12-27 DIAGNOSIS — M4802 Spinal stenosis, cervical region: Secondary | ICD-10-CM

## 2022-12-27 DIAGNOSIS — Z09 Encounter for follow-up examination after completed treatment for conditions other than malignant neoplasm: Secondary | ICD-10-CM

## 2022-12-27 DIAGNOSIS — Z803 Family history of malignant neoplasm of breast: Secondary | ICD-10-CM | POA: Diagnosis not present

## 2022-12-27 NOTE — Patient Instructions (Signed)
I value your feedback and you entrusting us with your care. If you get a [facility] patient survey, I would appreciate you taking the time to let us know about your experience today. Thank you! ? ? ?

## 2022-12-27 NOTE — Progress Notes (Signed)
[name], DO   Chief Complaint  Patient presents with   Referral    Pap smear only    HPI:      [name] is a 57 y.o. [id number] whose LMP was Patient's last menstrual period was 07/11/2020., presents today for NP pap smear only, referred by PCP. LMP over a yr ago, no PMB. Does have VS sx, tolerable some days. On gabapentin. Has mammo scheduled 5/24. FH breast cancer and meets cancer genetic testing guidelines but has MCD and not covered.  Last pap before covid; no hx of abn paps with tx.   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   GERD (gastroesophageal reflux disease) 01/21/2014   History of gestational diabetes 01/21/2014   Tonsillar hypertrophy 01/21/2014   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    Past Surgical History:  Procedure Laterality Date   ANTERIOR CERVICAL DECOMP/DISCECTOMY FUSION N/A 11/14/2022   Procedure: C5-7 ANTERIOR CERVICAL DISCECTOMY AND FUSION (NUVASIVE ACP, GLOBUS HEDRON);  Surgeon: Venetia Night, MD;  Location: [facility] ORS;  Service: Neurosurgery;  Laterality: N/A;   CARDIAC CATHETERIZATION  02/27/2016   Procedure: Left Heart Cath and Coronary Angiography;  Surgeon: Corky Crafts, MD;  Location: [facility] INVASIVE CV LAB;  Service: Cardiovascular;;   CESAREAN SECTION     CHOLECYSTECTOMY     COLONOSCOPY WITH PROPOFOL N/A 04/15/2019   Procedure:  COLONOSCOPY WITH PROPOFOL;  Surgeon: Pasty Spillers, MD;  Location: [facility] ENDOSCOPY;  Service: Endoscopy;  Laterality: N/A;   ESOPHAGOGASTRODUODENOSCOPY N/A 11/05/2022   Procedure: ESOPHAGOGASTRODUODENOSCOPY (EGD);  Surgeon: Jaynie Collins, DO;  Location: [facility] ENDOSCOPY;  Service: Gastroenterology;  Laterality: N/A;   ESOPHAGOGASTRODUODENOSCOPY (EGD) WITH PROPOFOL N/A 04/15/2019   Procedure: ESOPHAGOGASTRODUODENOSCOPY (EGD) WITH PROPOFOL;  Surgeon: Pasty Spillers, MD;  Location: [facility] ENDOSCOPY;  Service: Endoscopy;  Laterality: N/A;   SHOULDER ARTHROSCOPY WITH DEBRIDEMENT AND BICEP TENDON REPAIR Left 02/06/2022   Procedure: Left shoulder arthroscopic debridement, subacromial decompression, open subpectoral biceps tenodesis, and  Regeneten Patch application;  Surgeon: Signa Kell, MD;  Location: [facility];  Service: Orthopedics;  Laterality: Left;   THYROIDECTOMY  2018   THYROIDECTOMY      Family History  Problem Relation Age of Onset   Congestive Heart Failure Mother    Breast cancer Maternal Aunt        1s   Breast cancer Maternal Aunt        57s   Breast cancer Maternal Aunt        38s    Social History   Socioeconomic History   Marital status: Married    Spouse name: Mathis Fare   Number of children: 6   Years of education: Not on file   Highest education level: Not on file  Occupational History   Occupation: home maker  Tobacco Use   Smoking status: Never   Smokeless tobacco: Never  Vaping Use   Vaping Use:  Never used  Substance and Sexual Activity   Alcohol use: No    Alcohol/week: 0.0 standard drinks of alcohol   Drug use: No   Sexual activity: Yes    Birth control/protection: None  Other Topics Concern   Not on file  Social History Narrative   Home maker, 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, husband is a Administrator, sports   Social Determinants of Corporate investment banker Strain: Not on file  Food Insecurity: Not on file  Transportation Needs: Not  on file  Physical Activity: Not on file  Stress: Not on file  Social Connections: Not on file  Intimate Partner Violence: Not on file    Outpatient Medications Prior to Visit  Medication Sig Dispense Refill   acetaminophen (TYLENOL) 500 MG tablet Take 2 tablets (1,000 mg total) by mouth every 8 (eight) hours. 90 tablet 2   amLODipine (NORVASC) 5 MG tablet Take 1 tablet (5 mg total) by mouth daily. 90 tablet 0   aspirin EC 81 MG tablet Take 81 mg by mouth at bedtime. Swallow whole.     atorvastatin (LIPITOR) 20 MG tablet TAKE 1 TABLET BY MOUTH AT BEDTIME 90 tablet 0   Calcium Carbonate-Simethicone (TUMS GAS RELIEF CHEWY BITES PO) Take by mouth as needed.     EUTHYROX 125 MCG tablet Take 1 tablet (125 mcg total) by mouth daily before breakfast. 90 tablet 3   famotidine (PEPCID) 20 MG tablet Take 20 mg by mouth at bedtime.     fluticasone (FLONASE) 50 MCG/ACT nasal spray Place 2 sprays into both nostrils daily. Use for 4-6 weeks then stop and use seasonally or as needed. (Patient taking differently: Place 2 sprays into both nostrils as needed. Use for 4-6 weeks then stop and use seasonally or as needed.) 16 g 3   gabapentin (NEURONTIN) 300 MG capsule Take 1 capsule (300 mg total) by mouth 2 (two) times daily. 60 capsule 2   methocarbamol (ROBAXIN) 500 MG tablet Take 1 tablet (500 mg total) by mouth 4 (four) times daily. 120 tablet 0   montelukast (SINGULAIR) 10 MG tablet Take 1 tablet (10 mg total) by mouth at bedtime. 90 tablet 3   pantoprazole (PROTONIX) 40 MG tablet Take 1 tablet (40 mg total) by mouth 2 (two) times daily before a meal. 180 tablet 3   senna (SENOKOT) 8.6 MG TABS tablet Take 1 tablet (8.6 mg total) by mouth daily as needed for mild constipation. 30 tablet 0   VENTOLIN HFA 108 (90 Base) MCG/ACT inhaler INHALE 2 PUFFS BY MOUTH EVERY 4 HOURS AS NEEDED FOR WHEEZING FOR SHORTNESS OF BREATH 18 g 0   No facility-administered medications prior to visit.      ROS:  Review of  Systems  Constitutional:  Negative for fever.  Gastrointestinal:  Negative for blood in stool, constipation, diarrhea, nausea and vomiting.  Genitourinary:  Negative for dyspareunia, dysuria, flank pain, frequency, hematuria, urgency, vaginal bleeding, vaginal discharge and vaginal pain.  Musculoskeletal:  Negative for back pain.  Skin:  Negative for rash.   BREAST: No symptoms   OBJECTIVE:   Vitals:  BP 130/80   Ht 5\' 4"  (1.626 m)   Wt 242 lb (109.8 kg)   LMP 07/11/2020   BMI 41.54 kg/m   Physical Exam Vitals reviewed.  Constitutional:      Appearance: She is well-developed.  Pulmonary:     Effort: Pulmonary effort is normal.  Genitourinary:    General: Normal vulva.     Pubic Area:  No rash.      Labia:        Right: No rash, tenderness or lesion.        Left: No rash, tenderness or lesion.      Vagina: Normal. No vaginal discharge, erythema or tenderness.     Cervix: Normal.     Uterus: Normal. Not enlarged and not tender.      Adnexa: Right adnexa normal and left adnexa normal.       Right: No mass or tenderness.         Left: No mass or tenderness.    Musculoskeletal:        General: Normal range of motion.     Cervical back: Normal range of motion.  Skin:    General: Skin is warm and dry.  Neurological:     General: No focal deficit present.     Mental Status: She is alert and oriented to person, place, and time.  Psychiatric:        Mood and Affect: Mood normal.        Behavior: Behavior normal.        Thought Content: Thought content normal.        Judgment: Judgment normal.    Assessment/Plan: Cervical cancer screening - Plan: Cytology - PAP  Screening for HPV (human papillomavirus) - Plan: Cytology - PAP  Family history of breast cancer--recommended SBE and yearly mammos. Can do cancer genetic testing when covered by insurance.     Return if symptoms worsen or fail to improve.  Koa Zoeller B. Jones Viviani, PA-C 12/27/2022 10:55 AM

## 2022-12-27 NOTE — Progress Notes (Signed)
   REFERRING PHYSICIAN:  Kasandra Knudsen [address] [place],  [place] [address]  DOS: 11/14/22 C5-7 ACDF  HISTORY OF PRESENT ILLNESS: Morgan Stevens is status post ACDF.  She is doing reasonably well.  She has had some increasing her numbness and tingling over the past couple weeks.   PHYSICAL EXAMINATION:  NEUROLOGICAL:  General: In no acute distress.   Awake, alert, oriented to person, place, and time.  Pupils equal round and reactive to light.  Facial tone is symmetric.   Strength: Side Biceps Triceps Deltoid Interossei Grip Wrist Ext. Wrist Flex.  R 5 5 5 5 5 5 5   L 5 5 5 5 5 5 5    Incision c/d/I and healing well  Imaging:  No complications noted  Assessment / Plan: Morgan Stevens is doing well after ACDF.  I have given her some exercises for her neck.  She will slowly increase her activity.  She is on a current lifting limit of 25 pounds.  I will see her back in approximately 6 weeks.  I suspect her numbness and tingling will slowly resolve and improve over the next couple of months.    Venetia Night MD Dept of Neurosurgery

## 2022-12-31 LAB — CYTOLOGY - PAP
Comment: NEGATIVE
Diagnosis: NEGATIVE
High risk HPV: NEGATIVE

## 2023-01-03 ENCOUNTER — Ambulatory Visit
Admission: RE | Admit: 2023-01-03 | Discharge: 2023-01-03 | Disposition: A | Discharge status: Home / Self Care | Payer: Medicaid Other | Source: Ambulatory Visit | Attending: Family Medicine | Admitting: Family Medicine

## 2023-01-03 DIAGNOSIS — Z1231 Encounter for screening mammogram for malignant neoplasm of breast: Secondary | ICD-10-CM | POA: Insufficient documentation

## 2023-02-04 ENCOUNTER — Other Ambulatory Visit: Payer: Self-pay

## 2023-02-04 ENCOUNTER — Ambulatory Visit: Payer: Medicaid Other | Admitting: Family Medicine

## 2023-02-04 DIAGNOSIS — G959 Disease of spinal cord, unspecified: Secondary | ICD-10-CM

## 2023-02-05 ENCOUNTER — Ambulatory Visit (INDEPENDENT_AMBULATORY_CARE_PROVIDER_SITE_OTHER): Payer: Medicaid Other | Admitting: Neurosurgery

## 2023-02-05 ENCOUNTER — Encounter: Payer: Self-pay | Admitting: Neurosurgery

## 2023-02-05 ENCOUNTER — Ambulatory Visit
Admission: RE | Admit: 2023-02-05 | Discharge: 2023-02-05 | Disposition: A | Discharge status: Home / Self Care | Payer: Medicaid Other | Attending: Neurosurgery | Admitting: Neurosurgery

## 2023-02-05 ENCOUNTER — Ambulatory Visit
Admission: RE | Admit: 2023-02-05 | Discharge: 2023-02-05 | Disposition: A | Discharge status: Home / Self Care | Payer: Medicaid Other | Source: Ambulatory Visit | Attending: Neurosurgery | Admitting: Neurosurgery

## 2023-02-05 VITALS — BP 130/77 | Temp 98.3°F | Ht 64.0 in | Wt 242.0 lb

## 2023-02-05 DIAGNOSIS — G959 Disease of spinal cord, unspecified: Secondary | ICD-10-CM | POA: Insufficient documentation

## 2023-02-05 DIAGNOSIS — M4802 Spinal stenosis, cervical region: Secondary | ICD-10-CM

## 2023-02-05 DIAGNOSIS — Z981 Arthrodesis status: Secondary | ICD-10-CM

## 2023-02-05 DIAGNOSIS — Z09 Encounter for follow-up examination after completed treatment for conditions other than malignant neoplasm: Secondary | ICD-10-CM

## 2023-02-05 NOTE — Progress Notes (Signed)
   REFERRING PHYSICIAN:  Kasandra Knudsen [address] [place],  [place] [address]  DOS: 11/14/22 C5-7 ACDF  HISTORY OF PRESENT ILLNESS:  02/05/23 Ms. Napierkowski is s/p ACDF.  She states that she still has numbness and tingling and has good and bad days.  When asked if she feels better than she did before surgery, she states that some days she does not someday she does not.  She denies any worsening pain.  12/27/22 Huel Coventry is status post ACDF.  She is doing reasonably well.  She has had some increasing her numbness and tingling over the past couple weeks.   PHYSICAL EXAMINATION:  NEUROLOGICAL:  General: In no acute distress.   Awake, alert, oriented to person, place, and time.   Strength: Side Biceps Triceps Deltoid Interossei Grip Wrist Ext. Wrist Flex.  R 5 5 5 5 5 5 5   L 5 5 5 5 5 5 5    Incision well healed  Imaging:  02/05/23 cervical xrays Without evidence of hardware malfunction  Assessment / Plan: Maleea Camilo is doing fair after ACDF.  She states that she feels that the majority of her ongoing pain is secondary to ongoing trouble with her left shoulder however Dr. Allena Katz is no longer in network for her.  I have reached out to him to see if he could send her exercises as her insurance will no longer cover PT as it was not helping.  We will see her back in 3 months with repeat x-rays prior.  Was encouraged to call the office in the interim should she have any questions or concerns.  She expressed understanding and was in agreement with this plan.  I spent a total of 23 minutes in both face-to-face and non-face-to-face activities for this visit on the date of this encounter including review of records, review of imaging, discussion of symptoms, and documentation.  Manning Charity PA-C Dept of Neurosurgery

## 2023-02-06 ENCOUNTER — Ambulatory Visit: Payer: Medicaid Other | Admitting: Family Medicine

## 2023-02-07 ENCOUNTER — Encounter: Payer: Self-pay | Admitting: Physician Assistant

## 2023-02-07 ENCOUNTER — Ambulatory Visit: Payer: Medicaid Other | Admitting: Physician Assistant

## 2023-02-07 VITALS — BP 128/77 | HR 72 | Temp 98.4°F | Ht 64.0 in | Wt 241.6 lb

## 2023-02-07 DIAGNOSIS — K5904 Chronic idiopathic constipation: Secondary | ICD-10-CM

## 2023-02-07 DIAGNOSIS — K2100 Gastro-esophageal reflux disease with esophagitis, without bleeding: Secondary | ICD-10-CM

## 2023-02-07 DIAGNOSIS — K2960 Other gastritis without bleeding: Secondary | ICD-10-CM | POA: Diagnosis not present

## 2023-02-07 DIAGNOSIS — K449 Diaphragmatic hernia without obstruction or gangrene: Secondary | ICD-10-CM

## 2023-02-07 MED ORDER — FAMOTIDINE 20 MG PO TABS: 20.0000 mg | ORAL_TABLET | Freq: Two times a day (BID) | ORAL | 5 refills | Status: DC

## 2023-02-07 MED ORDER — ESOMEPRAZOLE MAGNESIUM 40 MG PO CPDR: 40.0000 mg | DELAYED_RELEASE_CAPSULE | Freq: Two times a day (BID) | ORAL | 5 refills | Status: DC

## 2023-02-07 NOTE — Progress Notes (Signed)
Celso Amy, PA-C [address]  [address]  [place], [place] [address]  Main: [phone]  Fax: [phone]   Gastroenterology Consultation  Referring Provider:     Saralyn Pilar * Primary Care Physician:  [name], DO Primary Gastroenterologist:  Dr. Wyline Mood / Celso Amy, PA-C  Reason for Consultation:     GERD, gastritis, dysphagia        HPI:   Morgan Stevens is a 57 y.o. y/o female referred for consultation & management  by [name], DO.    Previous patient of Dr. Maximino Greenland.  She is also seen [facility] (09/19/2022).    Has history of Chronic GERD, dysphagia, hiatal hernia, gastritis, Schatzki's ring, morbid obesity, chronic constipation.  Patient states she continues to have acid reflux, solid food dysphagia, epigastric pain, intestinal gas, and constipation.  Chronic GI symptoms for many years.  Currently has bowel movement twice per week.  No current constipation treatment.  She tried MiraLAX in the past which caused gas and abdominal pain and she discontinued.  She denies NSAID use.  Took omeprazole for several years and was switched to pantoprazole 40 Mg twice daily which is not controlling her reflux.  Famotidine 20 Mg once daily has helped mildly.  -EGD done 11/05/2022 by Dr. Timothy Lasso - Gastritis, 3 cm hiatal hernia, widely patent Schatzki's ring dilated, torturous esophagus, esophageal dysmotility/spasm.  Normal duodenum.  Biopsies showed healing erosive gastritis.  Negative for H. pylori or metaplasia.  -Colonoscopy 03/2019, Dr. Maximino Greenland - Normal.  5 year repeat. -EGD 03/2019 -widely patent Schatzki's ring, medium hiatal hernia, erosive gastropathy, benign gastric polyps.  Biopsies showed chronic gastritis, negative for H. pylori or EOE.  Esophageal biopsies positive for Candida.  -EGD at [facility] 08/2017 showed wide open Schatzki's ring, esophagitis, torturous esophagus, 2 cm hiatal hernia, few benign fundic gland  gastric polyps.  Biopsies showed chronic active gastritis.  No metaplasia.  Negative for H. pylori.  No Barrett's.  No EOE.    Past Medical History:  Diagnosis Date   Anemia    Chronic headaches    several per week   Gastritis    GERD (gastroesophageal reflux disease)    Hiatal hernia    Hyperlipidemia    Hypertension    Hypothyroidism    Obesity (BMI 30-39.9)    Pre-diabetes    Reflux    Spinal stenosis    Thyroid disease    Vertigo    rare   Wears contact lenses     Past Surgical History:  Procedure Laterality Date   ANTERIOR CERVICAL DECOMP/DISCECTOMY FUSION N/A 11/14/2022   Procedure: C5-7 ANTERIOR CERVICAL DISCECTOMY AND FUSION (NUVASIVE ACP, GLOBUS HEDRON);  Surgeon: Venetia Night, MD;  Location: [facility] ORS;  Service: Neurosurgery;  Laterality: N/A;   CARDIAC CATHETERIZATION  02/27/2016   Procedure: Left Heart Cath and Coronary Angiography;  Surgeon: Corky Crafts, MD;  Location: [facility] INVASIVE CV LAB;  Service: Cardiovascular;;   CESAREAN SECTION     CHOLECYSTECTOMY     COLONOSCOPY WITH PROPOFOL N/A 04/15/2019   Procedure: COLONOSCOPY WITH PROPOFOL;  Surgeon: Pasty Spillers, MD;  Location: [facility] ENDOSCOPY;  Service: Endoscopy;  Laterality: N/A;   ESOPHAGOGASTRODUODENOSCOPY N/A 11/05/2022   Procedure: ESOPHAGOGASTRODUODENOSCOPY (EGD);  Surgeon: Jaynie Collins, DO;  Location: [facility] ENDOSCOPY;  Service: Gastroenterology;  Laterality: N/A;   ESOPHAGOGASTRODUODENOSCOPY (EGD) WITH PROPOFOL N/A 04/15/2019   Procedure: ESOPHAGOGASTRODUODENOSCOPY (EGD) WITH PROPOFOL;  Surgeon: Pasty Spillers, MD;  Location: [facility] ENDOSCOPY;  Service:  Endoscopy;  Laterality: N/A;   SHOULDER ARTHROSCOPY WITH DEBRIDEMENT AND BICEP TENDON REPAIR Left 02/06/2022   Procedure: Left shoulder arthroscopic debridement, subacromial decompression, open subpectoral biceps tenodesis, and  Regeneten Patch application;  Surgeon: Signa Kell, MD;  Location: [facility];  Service:  Orthopedics;  Laterality: Left;   THYROIDECTOMY  2018   THYROIDECTOMY      Prior to Admission medications   Medication Sig Start Date End Date Taking? Authorizing Provider  amLODipine (NORVASC) 5 MG tablet Take 1 tablet (5 mg total) by mouth daily. 11/12/22   Debbe Odea, MD  aspirin EC 81 MG tablet Take 81 mg by mouth at bedtime. Swallow whole.    [provider]  atorvastatin (LIPITOR) 20 MG tablet TAKE 1 TABLET BY MOUTH AT BEDTIME 11/09/22   Karamalegos, Netta Neat, DO  Calcium Carbonate-Simethicone (TUMS GAS RELIEF CHEWY BITES PO) Take by mouth as needed.    [provider]  EUTHYROX 125 MCG tablet Take 1 tablet (125 mcg total) by mouth daily before breakfast. 08/07/22   Althea Charon, Netta Neat, DO  famotidine (PEPCID) 20 MG tablet Take 20 mg by mouth at bedtime.    [provider]  gabapentin (NEURONTIN) 300 MG capsule Take 1 capsule (300 mg total) by mouth 2 (two) times daily. 10/03/22   Karamalegos, Netta Neat, DO  methocarbamol (ROBAXIN) 500 MG tablet Take 1 tablet (500 mg total) by mouth 4 (four) times daily. 11/14/22   Susanne Borders, PA  montelukast (SINGULAIR) 10 MG tablet Take 1 tablet (10 mg total) by mouth at bedtime. 12/25/22   Karamalegos, Netta Neat, DO  pantoprazole (PROTONIX) 40 MG tablet Take 1 tablet (40 mg total) by mouth 2 (two) times daily before a meal. 10/23/22   Karamalegos, Alexander J, DO  VENTOLIN HFA 108 (90 Base) MCG/ACT inhaler INHALE 2 PUFFS BY MOUTH EVERY 4 HOURS AS NEEDED FOR WHEEZING FOR SHORTNESS OF BREATH 11/12/22   Karamalegos, Netta Neat, DO    Family History  Problem Relation Age of Onset   Congestive Heart Failure Mother    Breast cancer Maternal Aunt        34s   Breast cancer Maternal Aunt        14s   Breast cancer Maternal Aunt        46s     Social History   Tobacco Use   Smoking status: Never   Smokeless tobacco: Never  Vaping Use   Vaping Use: Never used  Substance Use Topics   Alcohol use: No     Alcohol/week: 0.0 standard drinks of alcohol   Drug use: No    Allergies as of 02/07/2023   (No Known Allergies)    Review of Systems:    All systems reviewed and negative except where noted in HPI.   Physical Exam:  LMP 07/11/2020  Patient's last menstrual period was 07/11/2020. Psych:  Alert and cooperative. Normal mood and affect. General:   Alert,  Well-developed, well-nourished, pleasant and cooperative in NAD Head:  Normocephalic and atraumatic. Eyes:  Sclera clear, no icterus.   Conjunctiva pink. Neck:  Supple; no masses or thyromegaly. Lungs:  Respirations even and unlabored.  Clear throughout to auscultation.   No wheezes, crackles, or rhonchi. No acute distress. Heart:  Regular rate and rhythm; no murmurs, clicks, rubs, or gallops. Abdomen:  Normal bowel sounds.  No bruits.  Soft, and obese without masses, hepatosplenomegaly or hernias noted.  Mild Epigastric Tenderness.  No guarding or rebound tenderness.  Neurologic:  Alert and oriented x3;  grossly normal neurologically. Psych:  Alert and cooperative. Normal mood and affect.  Imaging Studies: No results found.  Assessment and Plan:   Mattingly Fountaine is a 57 y.o. y/o female returns for f/u of Chronic GERD, dysphagia, hiatal hernia, gastritis, Schatzki's ring, esophageal dysmotility, chronic constipation.  Chronic GI symptoms for many years.  Multiple EGDs.  1.  Chronic gastritis Stop pantoprazole Start esomeprazole 40 Mg twice daily Increase famotidine 20 Mg twice daily Avoid NSAIDs  2.  Chronic GERD Recommend Lifestyle Modifications to prevent Acid Reflux.  Rec. Avoid coffee, sodas, peppermint, citrus fruits, and spicey foods.  Avoid eating 2-3 hours before bedtime.   3.  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  Lengthy discussion with patient and her daughter regarding education and treatment.   Recommend eat small frequent meals.  Eat sitting up.  Try Gaviscon 30 mL after each meal.   4.  Chronic constipation Gave samples of Linzess 145 mcg QD for 1 week, then 290 mcg QD for 1 week.  She will let me know which dose works best, and then she can call me back for a prescription.  Recommend high-fiber diet and 64 ounces of water daily.  Follow up in 3 months with TG.  Celso Amy, PA-C

## 2023-02-12 ENCOUNTER — Other Ambulatory Visit: Payer: Self-pay | Admitting: Family Medicine

## 2023-02-12 ENCOUNTER — Other Ambulatory Visit: Payer: Self-pay

## 2023-02-12 DIAGNOSIS — I10 Essential (primary) hypertension: Secondary | ICD-10-CM

## 2023-02-12 DIAGNOSIS — E7800 Pure hypercholesterolemia, unspecified: Secondary | ICD-10-CM

## 2023-02-12 MED ORDER — AMLODIPINE BESYLATE 5 MG PO TABS: 5.0000 mg | ORAL_TABLET | Freq: Every day | ORAL | 0 refills | Status: DC

## 2023-02-12 NOTE — Telephone Encounter (Signed)
Requested medication (s) are due for refill today: yes  Requested medication (s) are on the active medication list: yes  Last refill:  11/09/22 #90 0 refills  Future visit scheduled: yes in 4 months   Notes to clinic:  no refills remain. Do you want to refill Rx?    Requested Prescriptions  Pending Prescriptions Disp Refills   atorvastatin (LIPITOR) 20 MG tablet [Pharmacy Med Name: Atorvastatin Calcium 20 MG Oral Tablet] 90 tablet 0    Sig: TAKE 1 TABLET BY MOUTH AT BEDTIME     Cardiovascular:  Antilipid - Statins Failed - 02/12/2023 11:34 AM      Failed - Lipid Panel in normal range within the last 12 months    Cholesterol  Date Value Ref Range Status  12/10/2022 123 <200 mg/dL Final  16/05/9603 540 0 - 200 mg/dL Final   Ldl Cholesterol, Calc  Date Value Ref Range Status  05/08/2013 94 0 - 100 mg/dL Final   LDL Cholesterol (Calc)  Date Value Ref Range Status  12/10/2022 61 mg/dL (calc) Final    Comment:    Reference range: <100 . Desirable range <100 mg/dL for primary prevention;   <70 mg/dL for patients with CHD or diabetic patients  with > or = 2 CHD risk factors. Marland Kitchen LDL-C is now calculated using the Martin-Hopkins  calculation, which is a validated novel method providing  better accuracy than the Friedewald equation in the  estimation of LDL-C.  Horald Pollen et al. Lenox Ahr. 9811;914(78): 2061-2068  (http://education.[facility].com/faq/FAQ164)    HDL Cholesterol  Date Value Ref Range Status  05/08/2013 40 40 - 60 mg/dL Final   HDL  Date Value Ref Range Status  12/10/2022 44 (L) > OR = 50 mg/dL Final   Triglycerides  Date Value Ref Range Status  12/10/2022 94 <150 mg/dL Final  29/56/2130 865 0 - 200 mg/dL Final         Passed - Patient is not pregnant      Passed - Valid encounter within last 12 months    Recent Outpatient Visits           1 month ago Annual physical exam   [place] [facility] War, Netta Neat, DO   3  months ago Pre-op examination   [place] [facility] [name], DO   4 months ago Eosinophilic esophagitis   [place] [facility] [name], DO   6 months ago Eosinophilic esophagitis   [place] [facility] [name], DO   8 months ago Eosinophilic esophagitis   [place] [facility] [name], DO       Future Appointments             In 2 weeks Agbor-Etang, Arlys John, MD [facility] [place]   In 4 months Althea Charon, Netta Neat, DO [facility] [facility]

## 2023-02-13 ENCOUNTER — Ambulatory Visit: Payer: Medicaid Other | Admitting: Cardiology

## 2023-02-22 ENCOUNTER — Other Ambulatory Visit: Payer: Self-pay

## 2023-02-22 ENCOUNTER — Emergency Department: Payer: Medicaid Other

## 2023-02-22 ENCOUNTER — Emergency Department
Admission: EM | Admit: 2023-02-22 | Discharge: 2023-02-22 | Disposition: A | Discharge status: Home / Self Care | Payer: Medicaid Other | Attending: Emergency Medicine | Admitting: Emergency Medicine

## 2023-02-22 DIAGNOSIS — M7989 Other specified soft tissue disorders: Secondary | ICD-10-CM | POA: Diagnosis not present

## 2023-02-22 DIAGNOSIS — M79652 Pain in left thigh: Secondary | ICD-10-CM | POA: Diagnosis not present

## 2023-02-22 DIAGNOSIS — M79605 Pain in left leg: Secondary | ICD-10-CM | POA: Diagnosis not present

## 2023-02-22 LAB — CBC WITH DIFFERENTIAL/PLATELET
Abs Immature Granulocytes: 0.01 10*3/uL (ref 0.00–0.07)
Basophils Absolute: 0.1 10*3/uL (ref 0.0–0.1)
Basophils Relative: 1 %
Eosinophils Absolute: 0.1 10*3/uL (ref 0.0–0.5)
Eosinophils Relative: 2 %
HCT: 40.3 % (ref 36.0–46.0)
Hemoglobin: 13.8 g/dL (ref 12.0–15.0)
Immature Granulocytes: 0 %
Lymphocytes Relative: 46 %
Lymphs Abs: 2.6 10*3/uL (ref 0.7–4.0)
MCH: 29.6 pg (ref 26.0–34.0)
MCHC: 34.2 g/dL (ref 30.0–36.0)
MCV: 86.5 fL (ref 80.0–100.0)
Monocytes Absolute: 0.4 10*3/uL (ref 0.1–1.0)
Monocytes Relative: 7 %
Neutro Abs: 2.5 10*3/uL (ref 1.7–7.7)
Neutrophils Relative %: 44 %
Platelets: 313 10*3/uL (ref 150–400)
RBC: 4.66 MIL/uL (ref 3.87–5.11)
RDW: 13.2 % (ref 11.5–15.5)
WBC: 5.6 10*3/uL (ref 4.0–10.5)
nRBC: 0 % (ref 0.0–0.2)

## 2023-02-22 LAB — BASIC METABOLIC PANEL
Anion gap: 8 (ref 5–15)
BUN: 10 mg/dL (ref 6–20)
CO2: 28 mmol/L (ref 22–32)
Calcium: 9.3 mg/dL (ref 8.9–10.3)
Chloride: 106 mmol/L (ref 98–111)
Creatinine, Ser: 0.79 mg/dL (ref 0.44–1.00)
GFR, Estimated: >60 mL/min (ref >60)
Glucose, Bld: 98 mg/dL (ref 70–99)
Potassium: 3.7 mmol/L (ref 3.5–5.1)
Sodium: 142 mmol/L (ref 135–145)

## 2023-02-22 LAB — CK: Total CK: 127 U/L (ref 38–234)

## 2023-02-22 NOTE — ED Provider Notes (Signed)
[facility] Provider Note    Event Date/Time   First MD Initiated Contact with Patient 02/22/23 1524     (approximate)   History   Leg Pain   HPI  Morgan Stevens is a 57 y.o. female   Past medical history of pretension hyperlipidemia, here with left medial thigh pain onset earlier this afternoon atraumatic.  No obvious inciting event.  Pain in the medial side of the left thigh rating down to the popliteal area of the knee.  Has been ambulatory.  No skin changes or infectious symptoms like redness warmth or fevers chills.  No history of blood clots.  She has no perineal pain.  No urinary symptoms.   External Medical Documents Reviewed: Discharge summary from March 2024 when she had cervical stenosis and cervical myelopathy undergoing a ACDF to the C4-5 and C6-7 area.      Physical Exam   Triage Vital Signs: ED Triage Vitals [02/22/23 1433]  Enc Vitals Group     BP (!) 149/99     Pulse Rate 75     Resp 17     Temp 98.8 F (37.1 C)     Temp Source Oral     SpO2 99 %     Weight      Height      Head Circumference      Peak Flow      Pain Score 7     Pain Loc      Pain Edu?      Excl. in GC?     Most recent vital signs: Vitals:   02/22/23 1433  BP: (!) 149/99  Pulse: 75  Resp: 17  Temp: 98.8 F (37.1 C)  SpO2: 99%    General: Awake, no distress.  CV:  Good peripheral perfusion.  Resp:  Normal effort.  Abd:  No distention.  Other:  No significant swelling or erythema or warmth to the affected left medial thigh down to the knees on the left side.  Neurovascular intact to the affected extremity.  Some tenderness to palpation of that area.  No crepitus or pain out of proportion.   ED Results / Procedures / Treatments   Labs (all labs ordered are listed, but only abnormal results are displayed) Labs Reviewed - No data to display    PROCEDURES:  Critical Care performed: No  Procedures   MEDICATIONS ORDERED IN ED: Medications  - No data to display  IMPRESSION / MDM / ASSESSMENT AND PLAN / ED COURSE  I reviewed the triage vital signs and the nursing notes.                                Patient's presentation is most consistent with acute presentation with potential threat to life or bodily function.  Differential diagnosis includes, but is not limited to, DVT, muscle strain, skin infection, abscess, myositis, statin myositis, rhabdomyolysis   MDM: Acute onset thigh pain radiating down to the popliteal fossa, concern for DVT.  Neurovascular intact I do not think it is ischemia.  Does not appear infected.  Could be muscle strain.  DVT ultrasound ordered.  Localized pain no trauma no significant exertional history, doubt rhabdomyolysis, but does take statin- check ck  -- Labs unremarkable patient stable and DVT ultrasound is negative.  May be due to muscular strain, anticipatory guidance given PMD follow-up and she will return with new or worsening symptoms.  FINAL CLINICAL IMPRESSION(S) / ED DIAGNOSES   Final diagnoses:  None     Rx / DC Orders   ED Discharge Orders     None        Note:  This document was prepared using Dragon voice recognition software and may include unintentional dictation errors.    Pilar Jarvis, MD 02/22/23 [phone]

## 2023-02-22 NOTE — ED Notes (Signed)
D/C an reasons to return and f/up discussed with pt, pt verbalized understanding. Pt ambulatory with steady gait

## 2023-02-22 NOTE — Discharge Instructions (Signed)
Your evaluation in the emergency department did not show any signs of infection or blood clot.  I am not sure what is causing your pain but it may be due to muscle pain or muscle strain.  Take acetaminophen 650 mg and ibuprofen 400 mg every 6 hours for pain.  Take with food.  See your doctor next week for close follow-up appointment.  If you develop any new or worsening symptoms, call your doctor right away or come back to the emergency department for reevaluation.

## 2023-02-22 NOTE — ED Triage Notes (Signed)
Pt presents to ED with c/o of L inner thigh pain. NAD noted. Pt denies injury or trauma. NAD noted. Pt ambulatory to triage.

## 2023-02-27 ENCOUNTER — Encounter: Payer: Self-pay | Admitting: Cardiology

## 2023-02-27 ENCOUNTER — Ambulatory Visit: Payer: Medicaid Other | Attending: Cardiology | Admitting: Cardiology

## 2023-02-27 VITALS — BP 140/84 | HR 67 | Ht 64.0 in | Wt 241.6 lb

## 2023-02-27 DIAGNOSIS — I10 Essential (primary) hypertension: Secondary | ICD-10-CM | POA: Diagnosis not present

## 2023-02-27 DIAGNOSIS — R0609 Other forms of dyspnea: Secondary | ICD-10-CM

## 2023-02-27 NOTE — Progress Notes (Signed)
Cardiology Office Note:    Date:  02/27/2023   ID:  Morgan Stevens, DOB 27-Jul-1966, MRN [id number]  PCP:  [name], DO  Cardiologist:  Debbe Odea, MD  Electrophysiologist:  None   Referring MD: Saralyn Pilar *   Chief Complaint  Patient presents with   Follow-up    Patient denies new or acute cardiac problems/concerns today.      History of Present Illness:    Morgan Stevens is a 57 y.o. female with a hx of hypertension, morbid obesity, GERD, hiatal hernia who presents for follow-up.   Previously seen for shortness of breath with exertion, symptoms deemed from being morbidly obese.  Tolerating medications as prescribed, amlodipine previously started for elevated BP.  Last 2 checks on EMR have been within normal limits.  Overall doing okay, no new concerns at this time.  Prior notes/studies Echo 08/2022 EF 60 to 65%, impaired relaxation. Echo 12/2019 EF 55 to 60% Coronary CT 11/2019 calcium score 0, no evidence of CAD.   Past Medical History:  Diagnosis Date   Anemia    Chronic headaches    several per week   Gastritis    GERD (gastroesophageal reflux disease)    Hiatal hernia    Hyperlipidemia    Hypertension    Hypothyroidism    Obesity (BMI 30-39.9)    Pre-diabetes    Reflux    Spinal stenosis    Thyroid disease    Vertigo    rare   Wears contact lenses     Past Surgical History:  Procedure Laterality Date   ANTERIOR CERVICAL DECOMP/DISCECTOMY FUSION N/A 11/14/2022   Procedure: C5-7 ANTERIOR CERVICAL DISCECTOMY AND FUSION (NUVASIVE ACP, GLOBUS HEDRON);  Surgeon: Venetia Night, MD;  Location: [facility] ORS;  Service: Neurosurgery;  Laterality: N/A;   CARDIAC CATHETERIZATION  02/27/2016   Procedure: Left Heart Cath and Coronary Angiography;  Surgeon: Corky Crafts, MD;  Location: [facility] INVASIVE CV LAB;  Service: Cardiovascular;;   CESAREAN SECTION     CHOLECYSTECTOMY     COLONOSCOPY WITH PROPOFOL N/A 04/15/2019   Procedure:  COLONOSCOPY WITH PROPOFOL;  Surgeon: Pasty Spillers, MD;  Location: [facility] ENDOSCOPY;  Service: Endoscopy;  Laterality: N/A;   ESOPHAGOGASTRODUODENOSCOPY N/A 11/05/2022   Procedure: ESOPHAGOGASTRODUODENOSCOPY (EGD);  Surgeon: Jaynie Collins, DO;  Location: [facility] ENDOSCOPY;  Service: Gastroenterology;  Laterality: N/A;   ESOPHAGOGASTRODUODENOSCOPY (EGD) WITH PROPOFOL N/A 04/15/2019   Procedure: ESOPHAGOGASTRODUODENOSCOPY (EGD) WITH PROPOFOL;  Surgeon: Pasty Spillers, MD;  Location: [facility] ENDOSCOPY;  Service: Endoscopy;  Laterality: N/A;   SHOULDER ARTHROSCOPY WITH DEBRIDEMENT AND BICEP TENDON REPAIR Left 02/06/2022   Procedure: Left shoulder arthroscopic debridement, subacromial decompression, open subpectoral biceps tenodesis, and  Regeneten Patch application;  Surgeon: Signa Kell, MD;  Location: [facility];  Service: Orthopedics;  Laterality: Left;   THYROIDECTOMY  2018   THYROIDECTOMY      Current Medications: Current Meds  Medication Sig   amLODipine (NORVASC) 5 MG tablet Take 1 tablet (5 mg total) by mouth daily.   aspirin EC 81 MG tablet Take 81 mg by mouth at bedtime. Swallow whole.   atorvastatin (LIPITOR) 20 MG tablet TAKE 1 TABLET BY MOUTH AT BEDTIME   Calcium Carbonate-Simethicone (TUMS GAS RELIEF CHEWY BITES PO) Take by mouth as needed.   esomeprazole (NEXIUM) 40 MG capsule Take 1 capsule (40 mg total) by mouth 2 (two) times daily before a meal.   EUTHYROX 125 MCG tablet Take 1 tablet (125 mcg total) by mouth daily before breakfast.  famotidine (PEPCID) 20 MG tablet Take 1 tablet (20 mg total) by mouth 2 (two) times daily.   gabapentin (NEURONTIN) 300 MG capsule Take 1 capsule (300 mg total) by mouth 2 (two) times daily.   methocarbamol (ROBAXIN) 500 MG tablet Take 1 tablet (500 mg total) by mouth 4 (four) times daily.   montelukast (SINGULAIR) 10 MG tablet Take 1 tablet (10 mg total) by mouth at bedtime.   VENTOLIN HFA 108 (90 Base) MCG/ACT inhaler  INHALE 2 PUFFS BY MOUTH EVERY 4 HOURS AS NEEDED FOR WHEEZING FOR SHORTNESS OF BREATH     Allergies:   Patient has no known allergies.   Social History   Socioeconomic History   Marital status: Married    Spouse name: Mathis Fare   Number of children: 6   Years of education: Not on file   Highest education level: Not on file  Occupational History   Occupation: home maker  Tobacco Use   Smoking status: Never   Smokeless tobacco: Never  Vaping Use   Vaping Use: Never used  Substance and Sexual Activity   Alcohol use: No    Alcohol/week: 0.0 standard drinks of alcohol   Drug use: No   Sexual activity: Yes    Birth control/protection: None  Other Topics Concern   Not on file  Social History Narrative   Home maker, 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, husband is a Administrator, sports   Social Determinants of Corporate investment banker Strain: Not on file  Food Insecurity: Not on file  Transportation Needs: Not on file  Physical Activity: Not on file  Stress: Not on file  Social Connections: Not on file     Family History: The patient's family history includes Breast cancer in her maternal aunt, maternal aunt, and maternal aunt; Congestive Heart Failure in her mother.  ROS:   Please see the history of present illness.     All other systems reviewed and are negative.  EKGs/Labs/Other Studies Reviewed:    The following studies were reviewed today:   EKG Interpretation Date/Time:  Wednesday February 27 2023 11:07:26 EDT Ventricular Rate:  67 PR Interval:  166 QRS Duration:  78 QT Interval:  450 QTC Calculation: 475 R Axis:   23  Text Interpretation: Normal sinus rhythm Normal ECG Confirmed by Debbe Odea ([id number]) on 02/27/2023 11:19:32 AM    Recent Labs: 12/10/2022: ALT 34; TSH 0.81 02/22/2023: BUN 10; Creatinine, Ser 0.79; Hemoglobin 13.8; Platelets 313; Potassium 3.7; Sodium 142  Recent Lipid Panel    Component Value Date/Time   CHOL 123 12/10/2022 0934   CHOL 165 05/08/2013  0013   TRIG 94 12/10/2022 0934   TRIG 154 05/08/2013 0013   HDL 44 (L) 12/10/2022 0934   HDL 40 05/08/2013 0013   CHOLHDL 2.8 12/10/2022 0934   VLDL 23 11/21/2020 0427   VLDL 31 05/08/2013 0013   LDLCALC 61 12/10/2022 0934   LDLCALC 94 05/08/2013 0013    Physical Exam:    VS:  BP (!) 140/84 (BP Location: Right Arm, Patient Position: Sitting, Cuff Size: Large)   Pulse 67   Ht 5\' 4"  (1.626 m)   Wt 241 lb 9.6 oz (109.6 kg)   LMP 07/11/2020   SpO2 100%   BMI 41.47 kg/m     Wt Readings from Last 3 Encounters:  02/27/23 241 lb 9.6 oz (109.6 kg)  02/07/23 241 lb 9.6 oz (109.6 kg)  02/05/23 242 lb (109.8 kg)     GEN:  Well nourished, well  developed in no acute distress HEENT: Normal NECK: No JVD; No carotid bruits CARDIAC: RRR, no murmurs, rubs, gallops RESPIRATORY:  Clear to auscultation without rales, wheezing or rhonchi  ABDOMEN: Soft, non-tender, non-distended MUSCULOSKELETAL:  No edema; No deformity  SKIN: Warm and dry NEUROLOGIC:  Alert and oriented x 3 PSYCHIATRIC:  Normal affect   ASSESSMENT:    1. Dyspnea on exertion   2. Primary hypertension   3. Morbid obesity (HCC)    PLAN:    In order of problems listed above:  Dyspnea on exertion, chest pain appears noncardiac.  Dyspnea likely from morbid obesity.  Echo with normal EF 60 to 65%, impaired relaxation.  Coronary CT in 2021 no CAD.   Hypertension, BP elevated, usually controlled.  Continue Norvasc 5 mg daily.  Morbid obesity, low-calorie diet, weight loss advised.  Follow-up as needed.   Medication Adjustments/Labs and Tests Ordered: Current medicines are reviewed at length with the patient today.  Concerns regarding medicines are outlined above.  Orders Placed This Encounter  Procedures   EKG 12-Lead   No orders of the defined types were placed in this encounter.   Patient Instructions  Medication Instructions:  Your physician recommends that you continue on your current medications as directed.  Please refer to the Current Medication list given to you today.  *If you need a refill on your cardiac medications before your next appointment, please call your pharmacy*  Lab Work: -None ordered  Testing/Procedures: -None ordered  Follow-Up: At [facility], you and your health needs are our priority.  As part of our continuing mission to provide you with exceptional heart care, we have created designated Provider Care Teams.  These Care Teams include your primary Cardiologist (physician) and Advanced Practice Providers (APPs -  Physician Assistants and Nurse Practitioners) who all work together to provide you with the care you need, when you need it.  Your next appointment:   As needed   Provider:   You may see Debbe Odea, MD or one of the following Advanced Practice Providers on your designated Care Team:   Nicolasa Ducking, NP Eula Listen, PA-C Cadence Fransico Michael, PA-C Charlsie Quest, NP    Other Instructions -None    Signed, Debbe Odea, MD  02/27/2023 12:47 PM    [facility]

## 2023-02-27 NOTE — Patient Instructions (Signed)
Medication Instructions:  Your physician recommends that you continue on your current medications as directed. Please refer to the Current Medication list given to you today.  *If you need a refill on your cardiac medications before your next appointment, please call your pharmacy*  Lab Work: -None ordered  Testing/Procedures: -None ordered  Follow-Up: At [facility], you and your health needs are our priority.  As part of our continuing mission to provide you with exceptional heart care, we have created designated Provider Care Teams.  These Care Teams include your primary Cardiologist (physician) and Advanced Practice Providers (APPs -  Physician Assistants and Nurse Practitioners) who all work together to provide you with the care you need, when you need it.  Your next appointment:   As needed   Provider:   You may see Brian Agbor-Etang, MD or one of the following Advanced Practice Providers on your designated Care Team:   Christopher Berge, NP Ryan Dunn, PA-C Cadence Furth, PA-C Sheri Hammock, NP    Other Instructions -None  

## 2023-03-26 ENCOUNTER — Ambulatory Visit: Payer: Medicaid Other | Admitting: Family Medicine

## 2023-03-26 VITALS — BP 116/82 | HR 79 | Temp 96.6°F | Wt 241.0 lb

## 2023-03-26 DIAGNOSIS — M5441 Lumbago with sciatica, right side: Secondary | ICD-10-CM | POA: Diagnosis not present

## 2023-03-26 DIAGNOSIS — M5442 Lumbago with sciatica, left side: Secondary | ICD-10-CM

## 2023-03-26 DIAGNOSIS — R3915 Urgency of urination: Secondary | ICD-10-CM | POA: Diagnosis not present

## 2023-03-26 DIAGNOSIS — R350 Frequency of micturition: Secondary | ICD-10-CM | POA: Diagnosis not present

## 2023-03-26 LAB — POCT URINALYSIS DIPSTICK
Bilirubin, UA: NEGATIVE
Blood, UA: NEGATIVE
Glucose, UA: NEGATIVE
Ketones, UA: NEGATIVE
Leukocytes, UA: NEGATIVE
Nitrite, UA: NEGATIVE
Protein, UA: NEGATIVE
Spec Grav, UA: 1.01 (ref 1.010–1.025)
Urobilinogen, UA: 0.2 E.U./dL
pH, UA: 7 (ref 5.0–8.0)

## 2023-03-26 MED ORDER — CEPHALEXIN 500 MG PO CAPS: 500.0000 mg | ORAL_CAPSULE | Freq: Three times a day (TID) | ORAL | 0 refills | Status: DC

## 2023-03-26 NOTE — Progress Notes (Signed)
Subjective:    Patient ID: Morgan Stevens, female    DOB: 03-18-1966, 57 y.o.   MRN: [id number]  Morgan Stevens is a 57 y.o. female presenting on 03/26/2023 for Urinary Frequency and Back Pain (Upper back started a few weeks ago. /)   HPI  Urinary Frequency / Urgency Back Pain, mid bilateral  Reports Urinary urgency and frequency every hour or less, not having dysuria or burning or blood in urine. Bilateral upper back aching tightening cramping symptoms, not always associated with urination. Tried Tylenol Arthritis, does help. Worse with movement bending flexing extending rotation with spine. No traumatic injury She has not had history of kidney stone before  Denies hematuria, fever chills nausea vomiting abdominal pain or cramping.     12/25/2022    3:40 PM 10/19/2022    2:23 PM 10/03/2022    3:10 PM  Depression screen PHQ 2/9  Decreased Interest 2 2 2   Down, Depressed, Hopeless 2 2 3   PHQ - 2 Score 4 4 5   Altered sleeping 2 1 2   Tired, decreased energy 3 3 3   Change in appetite 2 2 1   Feeling bad or failure about yourself  2 2 2   Trouble concentrating 2 2 1   Moving slowly or fidgety/restless 0 0 0  Suicidal thoughts 0 0 0  PHQ-9 Score 15 14 14   Difficult doing work/chores  Very difficult Very difficult    Social History   Tobacco Use   Smoking status: Never   Smokeless tobacco: Never  Vaping Use   Vaping status: Never Used  Substance Use Topics   Alcohol use: No    Alcohol/week: 0.0 standard drinks of alcohol   Drug use: No    Review of Systems Per HPI unless specifically indicated above     Objective:    BP 116/82 (BP Location: Left Arm, Patient Position: Sitting, Cuff Size: Large)   Pulse 79   Temp (!) 96.6 F (35.9 C) (Temporal)   Wt 241 lb (109.3 kg)   LMP 07/11/2020   SpO2 100%   BMI 41.37 kg/m   Wt Readings from Last 3 Encounters:  03/26/23 241 lb (109.3 kg)  02/27/23 241 lb 9.6 oz (109.6 kg)  02/07/23 241 lb 9.6 oz (109.6 kg)    Physical  Exam Vitals and nursing note reviewed.  Constitutional:      General: She is not in acute distress.    Appearance: She is well-developed. She is not diaphoretic.     Comments: Well-appearing, comfortable, cooperative  HENT:     Head: Normocephalic and atraumatic.  Eyes:     General:        Right eye: No discharge.        Left eye: No discharge.     Conjunctiva/sclera: Conjunctivae normal.  Neck:     Thyroid: No thyromegaly.  Cardiovascular:     Rate and Rhythm: Normal rate and regular rhythm.     Heart sounds: Normal heart sounds. No murmur heard. Pulmonary:     Effort: Pulmonary effort is normal. No respiratory distress.     Breath sounds: Normal breath sounds. No wheezing or rales.  Musculoskeletal:     Cervical back: Normal range of motion and neck supple.     Comments: Localized bilateral mid thoracic paraspinal area of discomfort, not provoked on exam. Some limited rotation and bending  Lymphadenopathy:     Cervical: No cervical adenopathy.  Skin:    General: Skin is warm and dry.     Findings:  No erythema or rash.  Neurological:     Mental Status: She is alert and oriented to person, place, and time.  Psychiatric:        Behavior: Behavior normal.     Comments: Well groomed, good eye contact, normal speech and thoughts    Results for orders placed or performed in visit on 03/26/23  POCT urinalysis dipstick  Result Value Ref Range   Color, UA     Clarity, UA     Glucose, UA Negative Negative   Bilirubin, UA Negative    Ketones, UA Negative    Spec Grav, UA 1.010 1.010 - 1.025   Blood, UA Negative    pH, UA 7.0 5.0 - 8.0   Protein, UA Negative Negative   Urobilinogen, UA 0.2 0.2 or 1.0 E.U./dL   Nitrite, UA Negative    Leukocytes, UA Negative Negative   Appearance     Odor        Assessment & Plan:   Problem List Items Addressed This Visit   None Visit Diagnoses     Urinary frequency    -  Primary   Relevant Medications   cephALEXin (KEFLEX) 500 MG  capsule   Other Relevant Orders   POCT urinalysis dipstick (Completed)   Urine Culture   Urinary urgency       Relevant Medications   cephALEXin (KEFLEX) 500 MG capsule   Other Relevant Orders   POCT urinalysis dipstick (Completed)   Urine Culture   Acute midline low back pain with bilateral sciatica           Possible UTI Cannot rule out other bladder dysfunction OAB etc. Urine dipstick today was negative or normal. We will check Urine culture to see if any evidence of bacterial infection. Results in a few days. Keep a track on it.  Rx empiric Keflex antibiotic for 7 day course.  Back pain seems muscular, I would suggest keep on Tylenol + Add OTC Ibuprofen vs Aleve with meals as needed.  Hold on X-rays today. Unlikely kidney stone, and not sensitive on X-ray.  If severe advised return to care vs UC / ED if considering need CT for possible kidney stone. We discussed this is unlikely but if symptoms totally changed.  Orders Placed This Encounter  Procedures   Urine Culture   POCT urinalysis dipstick     Meds ordered this encounter  Medications   cephALEXin (KEFLEX) 500 MG capsule    Sig: Take 1 capsule (500 mg total) by mouth 3 (three) times daily. For 7 days    Dispense:  21 capsule    Refill:  0      Follow up plan: Return if symptoms worsen or fail to improve.   Saralyn Pilar, DO [facility] 03/26/2023, 10:49 AM

## 2023-03-26 NOTE — Patient Instructions (Addendum)
Thank you for coming to the office today.  Urine dipstick today was negative or normal. We will check Urine culture to see if any evidence of bacterial infection. Results in a few days. Keep a track on it.  Keflex antibiotic for 7 day course.  Back pain seems muscular, I would suggest keep on Tylenol + Add OTC Ibuprofen vs Aleve with meals as needed.  Hold on X-rays today.  Please schedule a Follow-up Appointment to: Return if symptoms worsen or fail to improve.  If you have any other questions or concerns, please feel free to call the office or send a message through MyChart. You may also schedule an earlier appointment if necessary.  Additionally, you may be receiving a survey about your experience at our office within a few days to 1 week by e-mail or mail. We value your feedback.  Saralyn Pilar, DO [facility], [place]

## 2023-04-30 ENCOUNTER — Other Ambulatory Visit: Payer: Self-pay

## 2023-04-30 DIAGNOSIS — I10 Essential (primary) hypertension: Secondary | ICD-10-CM

## 2023-04-30 MED ORDER — AMLODIPINE BESYLATE 5 MG PO TABS: 5.0000 mg | ORAL_TABLET | Freq: Every day | ORAL | 1 refills | Status: DC

## 2023-05-01 ENCOUNTER — Encounter: Payer: Self-pay | Admitting: Family Medicine

## 2023-05-01 ENCOUNTER — Ambulatory Visit: Payer: Medicaid Other | Admitting: Family Medicine

## 2023-05-01 VITALS — BP 124/68 | HR 78 | Ht 64.0 in | Wt 242.8 lb

## 2023-05-01 DIAGNOSIS — F410 Panic disorder [episodic paroxysmal anxiety] without agoraphobia: Secondary | ICD-10-CM

## 2023-05-01 DIAGNOSIS — F411 Generalized anxiety disorder: Secondary | ICD-10-CM | POA: Diagnosis not present

## 2023-05-01 DIAGNOSIS — F331 Major depressive disorder, recurrent, moderate: Secondary | ICD-10-CM | POA: Diagnosis not present

## 2023-05-01 DIAGNOSIS — M25562 Pain in left knee: Secondary | ICD-10-CM

## 2023-05-01 DIAGNOSIS — S83242A Other tear of medial meniscus, current injury, left knee, initial encounter: Secondary | ICD-10-CM

## 2023-05-01 MED ORDER — ESCITALOPRAM OXALATE 10 MG PO TABS: 10.0000 mg | ORAL_TABLET | Freq: Every day | ORAL | 2 refills | Status: DC

## 2023-05-01 MED ORDER — PREDNISONE 20 MG PO TABS: ORAL_TABLET | ORAL | 0 refills | Status: DC

## 2023-05-01 NOTE — Progress Notes (Addendum)
Subjective:    Patient ID: Morgan Stevens, female    DOB: [birth year], 57 y.o.   MRN: [id number]  Morgan Stevens is a 57 y.o. female presenting on 05/01/2023 for Anxiety and Pain   HPI  Left Knee Pain New problem Recently 2 weeks ago identified a pop and pain within her Left Knee. No known trauma or significant injury. No prior imaging of the knee. But reports past history of Baker's Cyst. Gradual improvement over past 2 weeks. Worsening with walking up and down stairs. Not using a device or cane or crutch. Tried Knee Brace (stronger w/ hinge etc) for support, some help. Taking Tylenol 650mg  x 2 every 8 hours as needed. Helpful to reduce pain but not resolving the problem  Generalized Anxiety Disorder, with panic attacks Major Depression recurrent moderate She reports chronic issue with mental health that has overwhelmed her and now she is seeking treatment. Other health conditions and stressors have impacted her negatively on mental health She is ready to pursue treatment Never on medication previously  Morbid Obesity BMI >41 Interval update since last physical 11/2022, she has gained approx 3 lbs, from 239 lbs up to 242 lbs Tried to use various OTC supplements: Apple cider vinegar capsules  Sea moss gummies Previously on Mediterranean diet Now today interested in Keto diet and trial on ketone capsules Exercise regimen with walking and cycling 100-150 min per week.      05/01/2023    1:38 PM 12/25/2022    3:40 PM 10/19/2022    2:23 PM  Depression screen PHQ 2/9  Decreased Interest 1 2 2   Down, Depressed, Hopeless 2 2 2   PHQ - 2 Score 3 4 4   Altered sleeping 1 2 1   Tired, decreased energy 3 3 3   Change in appetite 2 2 2   Feeling bad or failure about yourself  2 2 2   Trouble concentrating 1 2 2   Moving slowly or fidgety/restless 0 0 0  Suicidal thoughts 0 0 0  PHQ-9 Score 12 15 14   Difficult doing work/chores Very difficult  Very difficult      05/01/2023    1:39 PM 10/19/2022     2:23 PM 10/03/2022    3:10 PM 08/07/2022    1:59 PM  GAD 7 : Generalized Anxiety Score  Nervous, Anxious, on Edge 3 2 3 2   Control/stop worrying 3 2 3 2   Worry too much - different things 3 2 3 2   Trouble relaxing 2 2 3 2   Restless 0 1 0 1  Easily annoyed or irritable 3 2 3 2   Afraid - awful might happen 2 2 3 2   Total GAD 7 Score 16 13 18 13   Anxiety Difficulty  Somewhat difficult  Somewhat difficult    Social History   Tobacco Use   Smoking status: Never   Smokeless tobacco: Never  Vaping Use   Vaping status: Never Used  Substance Use Topics   Alcohol use: No    Alcohol/week: 0.0 standard drinks of alcohol   Drug use: No    Review of Systems Per HPI unless specifically indicated above     Objective:    BP 124/68   Pulse 78   Ht 5\' 4"  (1.626 m)   Wt 242 lb 12.8 oz (110.1 kg)   LMP 07/11/2020   SpO2 98%   BMI 41.68 kg/m   Wt Readings from Last 3 Encounters:  05/01/23 242 lb 12.8 oz (110.1 kg)  03/26/23 241 lb (109.3 kg)  02/27/23  241 lb 9.6 oz (109.6 kg)    Physical Exam Vitals and nursing note reviewed.  Constitutional:      General: She is not in acute distress.    Appearance: Normal appearance. She is well-developed. She is obese. She is not diaphoretic.     Comments: Well-appearing, comfortable, cooperative  HENT:     Head: Normocephalic and atraumatic.  Eyes:     General:        Right eye: No discharge.        Left eye: No discharge.     Conjunctiva/sclera: Conjunctivae normal.  Cardiovascular:     Rate and Rhythm: Normal rate.  Pulmonary:     Effort: Pulmonary effort is normal.  Musculoskeletal:     Comments: .Left Knee Inspection: bulky appearance and symmetrical. No ecchymosis or effusion. Palpation: Mild +TTP Left knee only lateral joint line. No crepitus ROM: Full active ROM bilaterally Special Testing: Lachman / Valgus/Varus tests negative with intact ligaments (ACL, MCL, LCL). Standing Thessaly testing POSITIVE lateral meniscus and  instability Strength: 5/5 intact knee flex/ext, ankle dorsi/plantarflex Neurovascular: distally intact sensation light touch and pulses   Skin:    General: Skin is warm and dry.     Findings: No erythema or rash.  Neurological:     Mental Status: She is alert and oriented to person, place, and time.  Psychiatric:        Mood and Affect: Mood normal.        Behavior: Behavior normal.        Thought Content: Thought content normal.     Comments: Well groomed, good eye contact, normal speech and thoughts    Results for orders placed or performed in visit on 03/26/23  Urine Culture   Specimen: Urine  Result Value Ref Range   MICRO NUMBER: [id number]    SPECIMEN QUALITY: Adequate    Sample Source URINE, CLEAN CATCH    STATUS: FINAL    ISOLATE 1: Klebsiella pneumoniae (A)       Susceptibility   Klebsiella pneumoniae - URINE CULTURE, REFLEX    AMOX/CLAVULANIC <=2 Sensitive     AMPICILLIN >=32 Resistant     AMPICILLIN/SULBACTAM 4 Sensitive     CEFAZOLIN* <=4 Not Reportable      * For infections other than uncomplicated UTI caused by E. coli, K. pneumoniae or P. mirabilis: Cefazolin is resistant if MIC > or = 8 mcg/mL. (Distinguishing susceptible versus intermediate for isolates with MIC < or = 4 mcg/mL requires additional testing.) For uncomplicated UTI caused by E. coli, K. pneumoniae or P. mirabilis: Cefazolin is susceptible if MIC <32 mcg/mL and predicts susceptible to the oral agents cefaclor, cefdinir, cefpodoxime, cefprozil, cefuroxime, cephalexin and loracarbef.     CEFTAZIDIME <=1 Sensitive     CEFEPIME <=1 Sensitive     CEFTRIAXONE <=1 Sensitive     CIPROFLOXACIN <=0.25 Sensitive     LEVOFLOXACIN <=0.12 Sensitive     GENTAMICIN <=1 Sensitive     IMIPENEM <=0.25 Sensitive     NITROFURANTOIN 64 Intermediate     PIP/TAZO <=4 Sensitive     TOBRAMYCIN <=1 Sensitive     TRIMETH/SULFA* <=20 Sensitive      * For infections other than uncomplicated UTI caused by E. coli,  K. pneumoniae or P. mirabilis: Cefazolin is resistant if MIC > or = 8 mcg/mL. (Distinguishing susceptible versus intermediate for isolates with MIC < or = 4 mcg/mL requires additional testing.) For uncomplicated UTI caused by E. coli, K. pneumoniae or P. mirabilis:  Cefazolin is susceptible if MIC <32 mcg/mL and predicts susceptible to the oral agents cefaclor, cefdinir, cefpodoxime, cefprozil, cefuroxime, cephalexin and loracarbef. Legend: S = Susceptible  I = Intermediate R = Resistant  NS = Not susceptible * = Not tested  NR = Not reported **NN = See antimicrobic comments   POCT urinalysis dipstick  Result Value Ref Range   Color, UA     Clarity, UA     Glucose, UA Negative Negative   Bilirubin, UA Negative    Ketones, UA Negative    Spec Grav, UA 1.010 1.010 - 1.025   Blood, UA Negative    pH, UA 7.0 5.0 - 8.0   Protein, UA Negative Negative   Urobilinogen, UA 0.2 0.2 or 1.0 E.U./dL   Nitrite, UA Negative    Leukocytes, UA Negative Negative   Appearance     Odor        Assessment & Plan:   Problem List Items Addressed This Visit     Morbid obesity (HCC)   Other Visit Diagnoses       Left medial knee pain    -  Primary     Acute medial meniscus tear of left knee, initial encounter         Generalized anxiety disorder with panic attacks       Relevant Medications   escitalopram (LEXAPRO) 10 MG tablet     Major depressive disorder, recurrent, moderate (HCC)       Relevant Medications   escitalopram (LEXAPRO) 10 MG tablet       Significant decline in mental health with overwhelmed by stressors  Anxiety primary issue Underlying depression No prior therapy  Start Escitalopram (generic Lexapro) 10mg  daily with meal for anxiety and mood. May take 3-4 weeks for full effect Check out list of therapy options below if interested. We can refer or you can self refer.  ---------------------  You most likely have a LEFT knee sprain - this is an injury to the  ligaments supporting the knee, it can be a minor injury or small tear, rarely it can turn out to be a larger tear to the ligament if it does not improve, we cannot rule this out.   Also I am concerned about the possibility of a meniscus injury (the cartilage shock absorber within the knee)  No X-ray today since your apt with Ortho is on 9/11 They will likely do X-rays and consider MRI next  Keep using the Knee Brace to support your knee and limit motion of the knee to help it heal, avoid excess weight bearing and repetitive activities on it  Recommend trial of Anti-inflammatory with oral prednisone taper 7 days  Keep on Tylenol Arthritis Strength dosing.  Morbid Obesity BMI >41 Unsuccessful weight loss efforts in past 5+ months, with weight gain 3 lbs, without loss She has tried various diet and exercise regimen. Now goal to switch to Keto diet and increase exercise with adding cycling.  Meds ordered this encounter  Medications   predniSONE (DELTASONE) 20 MG tablet    Sig: Take daily with food. Start with 60mg  (3 pills) x 2 days, then reduce to 40mg  (2 pills) x 2 days, then 20mg  (1 pill) x 3 days    Dispense:  13 tablet    Refill:  0   escitalopram (LEXAPRO) 10 MG tablet    Sig: Take 1 tablet (10 mg total) by mouth daily.    Dispense:  30 tablet    Refill:  2  Follow up plan: Return for re-schedule apt for 6 weeks instead of 10/30, 6 month PreDM A1c, Weight / mood/anxiety knee.  Saralyn Pilar, DO [facility] 05/01/2023, 1:44 PM

## 2023-05-01 NOTE — Patient Instructions (Addendum)
Thank you for coming to the office today.  Start Escitalopram (generic Lexapro) 10mg  daily with meal for anxiety and mood. May take 3-4 weeks for full effect Check out list of therapy options below if interested. We can refer or you can self refer.  ---------------------  You most likely have a LEFT knee sprain - this is an injury to the ligaments supporting the knee, it can be a minor injury or small tear, rarely it can turn out to be a larger tear to the ligament if it does not improve, we cannot rule this out.   Also I am concerned about the possibility of a meniscus injury (the cartilage shock absorber within the knee)  No X-ray today since your apt with Ortho is on 9/11 They will likely do X-rays and consider MRI next  Keep using the Knee Brace to support your knee and limit motion of the knee to help it heal, avoid excess weight bearing and repetitive activities on it  Recommend trial of Anti-inflammatory with oral prednisone taper 7 days  Keep on Tylenol Arthritis Strength dosing.  Use RICE therapy: - R - Rest / relative rest with activity modification avoid overuse and frequent bending or pressure on bent knee - I - Ice packs (make sure you use a towel or sock / something to protect skin) - C - Compression with ACE wrap or immobilizer apply pressure and reduce swelling allowing more support - E - Elevation - if significant swelling, lift leg above heart level (toes above your nose) to help reduce swelling, most helpful at night after day of being on your feet  They may need to consider arthroscopy or scope in the future if confirm it is meniscus.  ---------------------------------------  These offices have both PSYCHIATRY doctors and THERAPISTS  [facility], water quality Available) [place] [address] [place], [place] [address] Phone: [phone]  [facility] Address: [address], [place], [place] [address] [email or web address] Phone:  [phone]  [facility]) Address: [address], [place], [place] [address] Hours: 8:30AM-5PM Phone: [phone]  [facility] (Adult, Peds, Geriatric, Counseling) [address] [place], [place] [address] Phone: [phone] Fax: [phone]  [facility] at [facility] [address] [place], [place] [address] Phone: [phone]  [facility] (All ages) [address], [place], [id number] Phone: [phone] (Option 1) [email or web address]  ----------------------------------------------------------------- THERAPIST ONLY  (No Psychiatry)  [facility] [address] [place], [place] [address] [place] P: [phone]  [facility]) [facility], [facility] [address] [place], [place] [address] [phone]  [facility]   Address: [address] [place], [place] [address] Hours: Open today  9AM-7PM Phone: [phone]  [facility] [address] [facility]  - [facility] Address: [address] [place], [place] [address] Phone: [phone]  [facility] [place], [place] [address] Phone: [phone]    Please schedule a Follow-up Appointment to: Return for re-schedule apt for 6 weeks instead of 10/30, 6 month PreDM A1c, Weight / mood/anxiety knee.  If you have any other questions or concerns, please feel free to call the office or send a message through MyChart. You may also schedule an earlier appointment if necessary.  Additionally, you may be receiving a survey about your experience at our office within a  few days to 1 week by e-mail or mail. We value your feedback.  Saralyn Pilar, DO [facility] [place]

## 2023-05-02 ENCOUNTER — Other Ambulatory Visit: Payer: Self-pay | Admitting: Family Medicine

## 2023-05-02 ENCOUNTER — Encounter: Payer: Self-pay | Admitting: Family Medicine

## 2023-05-02 DIAGNOSIS — G959 Disease of spinal cord, unspecified: Secondary | ICD-10-CM

## 2023-05-09 ENCOUNTER — Ambulatory Visit: Payer: Medicaid Other | Admitting: Neurosurgery

## 2023-05-10 ENCOUNTER — Ambulatory Visit: Payer: Medicaid Other | Admitting: Physician Assistant

## 2023-05-13 ENCOUNTER — Telehealth: Payer: Medicaid Other | Admitting: Physician Assistant

## 2023-05-13 ENCOUNTER — Emergency Department
Admission: EM | Admit: 2023-05-13 | Discharge: 2023-05-13 | Disposition: A | Discharge status: Home / Self Care | Payer: Medicaid Other | Attending: Emergency Medicine | Admitting: Emergency Medicine

## 2023-05-13 ENCOUNTER — Other Ambulatory Visit: Payer: Self-pay

## 2023-05-13 ENCOUNTER — Encounter: Payer: Self-pay | Admitting: Family Medicine

## 2023-05-13 ENCOUNTER — Encounter: Payer: Self-pay | Admitting: Emergency Medicine

## 2023-05-13 DIAGNOSIS — J029 Acute pharyngitis, unspecified: Secondary | ICD-10-CM | POA: Diagnosis not present

## 2023-05-13 DIAGNOSIS — U071 COVID-19: Secondary | ICD-10-CM | POA: Diagnosis not present

## 2023-05-13 DIAGNOSIS — I10 Essential (primary) hypertension: Secondary | ICD-10-CM | POA: Insufficient documentation

## 2023-05-13 LAB — SARS CORONAVIRUS 2 BY RT PCR: SARS Coronavirus 2 by RT PCR: POSITIVE — AB

## 2023-05-13 LAB — GROUP A STREP BY PCR: Group A Strep by PCR: NOT DETECTED

## 2023-05-13 MED ORDER — NAPROXEN 500 MG PO TABS
500.0000 mg | ORAL_TABLET | Freq: Two times a day (BID) | ORAL | 0 refills | Status: DC
Start: 2023-05-13 — End: 2023-06-13

## 2023-05-13 MED ORDER — BENZONATATE 100 MG PO CAPS: 100.0000 mg | ORAL_CAPSULE | Freq: Three times a day (TID) | ORAL | 0 refills | Status: DC | PRN

## 2023-05-13 MED ORDER — PROMETHAZINE-DM 6.25-15 MG/5ML PO SYRP: 5.0000 mL | ORAL_SOLUTION | Freq: Four times a day (QID) | ORAL | 0 refills | Status: DC | PRN

## 2023-05-13 MED ORDER — LIDOCAINE VISCOUS HCL 2 % MT SOLN
OROMUCOSAL | 0 refills | Status: DC
Start: 2023-05-13 — End: 2023-06-13

## 2023-05-13 NOTE — ED Provider Notes (Signed)
[facility] Provider Note    Event Date/Time   First MD Initiated Contact with Patient 05/13/23 1826     (approximate)   History   Sore Throat   HPI  Morgan Stevens is a 57 y.o. female with history of thyroid disease, hypertension, reflux presents emergency department with complaints of sore throat, cold symptoms and felt like her heart was racing earlier today.  Took NyQuil day and night medication.  States symptoms started after that.  Denies fever or chills.  No vomiting or diarrhea.  No chest pain or shortness of breath      Physical Exam   Triage Vital Signs: ED Triage Vitals  Encounter Vitals Group     BP 05/13/23 1830 (!) 149/95     Systolic BP Percentile --      Diastolic BP Percentile --      Pulse Rate 05/13/23 1830 (!) 112     Resp 05/13/23 1830 17     Temp 05/13/23 1830 99 F (37.2 C)     Temp Source 05/13/23 1830 Oral     SpO2 05/13/23 1830 100 %     Weight 05/13/23 1824 242 lb 8.1 oz (110 kg)     Height 05/13/23 1824 5\' 4"  (1.626 m)     Head Circumference --      Peak Flow --      Pain Score 05/13/23 1830 4     Pain Loc --      Pain Education --      Exclude from Growth Chart --     Most recent vital signs: Vitals:   05/13/23 1830  BP: (!) 149/95  Pulse: (!) 112  Resp: 17  Temp: 99 F (37.2 C)  SpO2: 100%     General: Awake, no distress.   CV:  Good peripheral perfusion.  Tachycardic and regular rhythm Resp:  Normal effort. Lungs CTA Abd:  No distention.   Other:  Throat appears to be normal, neck is supple, no lymphadenopathy   ED Results / Procedures / Treatments   Labs (all labs ordered are listed, but only abnormal results are displayed) Labs Reviewed  SARS CORONAVIRUS 2 BY RT PCR - Abnormal; Notable for the following components:      Result Value   SARS Coronavirus 2 by RT PCR POSITIVE (*)    All other components within normal limits  GROUP A STREP BY PCR      EKG     RADIOLOGY     PROCEDURES:   Procedures   MEDICATIONS ORDERED IN ED: Medications - No data to display   IMPRESSION / MDM / ASSESSMENT AND PLAN / ED COURSE  I reviewed the triage vital signs and the nursing notes.                              Differential diagnosis includes, but is not limited to, strep throat, COVID, viral URI, tachycardic secondary to medication  Patient's presentation is most consistent with acute illness / injury with system symptoms.   Since patient had cold medication I feel this is why her heart rate was elevated earlier today.  Will do a strep test and COVID test due to her URI symptoms.   Strep test reassuring, COVID test positive  Patient is vaccinated so she will need to remain out of work for 5 days.  Explained findings to her.  She was given a prescription for [employer]  Perles for cough.  She is to drink plenty of fluids, take Tylenol and ibuprofen.  Gargle with warm salt water.  Patient is in agreement treatment plan.  She was discharged stable condition with a work note   FINAL CLINICAL IMPRESSION(S) / ED DIAGNOSES   Final diagnoses:  COVID     Rx / DC Orders   ED Discharge Orders          Ordered    benzonatate (TESSALON PERLES) 100 MG capsule  3 times daily PRN        05/13/23 2055             Note:  This document was prepared using Dragon voice recognition software and may include unintentional dictation errors.    Faythe Ghee, PA-C 05/13/23 2123    Minna Antis, MD 05/13/23 2248

## 2023-05-13 NOTE — ED Triage Notes (Signed)
Presents with sore throat for 2 days  Today  states she felt like her hear was racing  Denies any fever

## 2023-05-13 NOTE — Progress Notes (Signed)
E-Visit for [employer] Virus / COVID Screening  Your current symptoms could be consistent with COVID.  Please complete a Covid test either at home or check with your local pharmacy to see if they provide testing.    You have tested positive for COVID-19, meaning that you were infected with the novel coronavirus and could give the virus to others.  Most people with COVID-19 have mild illness and can recover at home without medical care. Do not leave your home, except to get medical care. Do not visit public areas and do not go to places where you are unable to wear a mask. It is important that you stay home  to take care for yourself and to help protect other people in your home and community.      Isolation Instructions:   You are to isolate at home until you have been fever free for at least 24 hours without a fever-reducing medication, and symptoms have been steadily improving for 24 hours. At that time,  you can end isolation but need to mask for an additional 5 days.  If you must be around other household members who do not have symptoms, you need to make sure that both you and the family members are masking consistently with a high-quality mask.  If you note any worsening of symptoms despite treatment, please seek an in-person evaluation ASAP. If you note any significant shortness of breath or any chest pain, please seek ER evaluation. Please do not delay care!  Go to the nearest hospital ED for assessment if fever/cough/breathlessness are severe or illness seems like a threat to life.    The following symptoms may appear 2-14 days after exposure: Fever Cough Shortness of breath or difficulty breathing Chills Repeated shaking with chills Muscle pain Headache Sore throat New loss of taste or smell Fatigue Congestion or runny nose Nausea or vomiting Diarrhea  You can use medication such as prescription cough medication called Phenergan DM 6.25 mg/15 mg. You make take one teaspoon / 5 ml  every 4-6 hours as needed for cough and prescription anti-inflammatory called Naprosyn 500 mg. Take twice daily as needed for fever or body aches for 2 weeks, Viscous lidocaine Swallow 5mL every 6 hours as needed for sore throat  You may also take acetaminophen (Tylenol) as needed for fever.  HOME CARE Only take medications as instructed by your medical team. Drink plenty of fluids and get plenty of rest. A steam or ultrasonic humidifier can help if you have congestion.  GET HELP RIGHT AWAY IF YOU HAVE EMERGENCY WARNING SIGNS.  Call 911 or proceed to your closest emergency facility if: You develop worsening high fever. Trouble breathing Bluish lips or face Persistent pain or pressure in the chest New confusion Inability to wake or stay awake You cough up blood. Your symptoms become more severe Inability to hold down food or fluids  This list is not all possible symptoms. Contact your medical provider for any symptoms that are severe or concerning to you.   Your e-visit answers were reviewed by a board certified advanced clinical practitioner to complete your personal care plan.  Depending on the condition, your plan could have included both over the counter or prescription medications.  If there is a problem, please reply once you have received a response from your provider.  Your safety is important to [place].  If you have drug allergies check your prescription carefully.    You can use MyChart to ask questions about today's visit,  request a non-urgent call back, or ask for a work or school excuse for 24 hours related to this e-Visit. If it has been greater than 24 hours you will need to follow up with your provider or enter a new e-Visit to address those concerns. You will get an e-mail in the next two days asking about your experience.  I hope that your e-visit has been valuable and will speed your recovery. Thank you for using e-visits.    I have spent 5 minutes in review of e-visit  questionnaire, review and updating patient chart, medical decision making and response to patient.   Margaretann Loveless, PA-C

## 2023-05-25 ENCOUNTER — Encounter: Payer: Self-pay | Admitting: Family Medicine

## 2023-05-25 DIAGNOSIS — L304 Erythema intertrigo: Secondary | ICD-10-CM

## 2023-05-27 MED ORDER — TRIAMCINOLONE ACETONIDE 0.5 % EX CREA
1.0000 | TOPICAL_CREAM | Freq: Two times a day (BID) | CUTANEOUS | 0 refills | Status: DC
Start: 2023-05-27 — End: 2023-07-31

## 2023-05-27 MED ORDER — FLUCONAZOLE 150 MG PO TABS: 150.0000 mg | ORAL_TABLET | ORAL | 0 refills | Status: DC

## 2023-06-05 ENCOUNTER — Ambulatory Visit: Payer: Medicaid Other | Admitting: Physician Assistant

## 2023-06-12 ENCOUNTER — Ambulatory Visit: Payer: Medicaid Other | Admitting: Family Medicine

## 2023-06-13 ENCOUNTER — Ambulatory Visit
Admission: RE | Admit: 2023-06-13 | Discharge: 2023-06-13 | Disposition: A | Discharge status: Home / Self Care | Payer: Medicaid Other | Source: Ambulatory Visit | Attending: Neurosurgery | Admitting: Neurosurgery

## 2023-06-13 ENCOUNTER — Ambulatory Visit
Admission: RE | Admit: 2023-06-13 | Discharge: 2023-06-13 | Disposition: A | Discharge status: Home / Self Care | Payer: Medicaid Other | Attending: Neurosurgery | Admitting: Neurosurgery

## 2023-06-13 ENCOUNTER — Ambulatory Visit: Payer: Medicaid Other | Admitting: Neurosurgery

## 2023-06-13 ENCOUNTER — Encounter: Payer: Self-pay | Admitting: Neurosurgery

## 2023-06-13 VITALS — BP 136/84 | Ht 64.0 in | Wt 242.0 lb

## 2023-06-13 DIAGNOSIS — Z4789 Encounter for other orthopedic aftercare: Secondary | ICD-10-CM | POA: Diagnosis not present

## 2023-06-13 DIAGNOSIS — Z981 Arthrodesis status: Secondary | ICD-10-CM | POA: Diagnosis not present

## 2023-06-13 DIAGNOSIS — G959 Disease of spinal cord, unspecified: Secondary | ICD-10-CM

## 2023-06-13 NOTE — Progress Notes (Signed)
   REFERRING PHYSICIAN:  Kasandra Knudsen [address] [place],  [place] [address]  DOS: 11/14/22 C5-7 ACDF  HISTORY OF PRESENT ILLNESS:   06/13/2023 Ms. Donaghey is doing very well.  Unfortunately her husband recently suffered a stroke.  He is recovering well from that.    PHYSICAL EXAMINATION:  NEUROLOGICAL:  General: In no acute distress.   Awake, alert, oriented to person, place, and time.   Strength: Side Biceps Triceps Deltoid Interossei Grip Wrist Ext. Wrist Flex.  R 5 5 5 5 5 5 5   L 5 5 5 5 5 5 5    Incision well healed  Imaging:  Cervical spine x-rays reviewed from today-no issues  Assessment / Plan: Glorianne Proctor is doing well after ACDF.  She has made substantial improvements.  I am very pleased with her improvements.  At this point, I will release her from clinic.  I remain available to her at any point in future.  I am very pleased with her surgical outcome.   I spent a total of 10 minutes in this patient's care today. This time was spent reviewing pertinent records including imaging studies, obtaining and confirming history, performing a directed evaluation, formulating and discussing my recommendations, and documenting the visit within the medical record.   Venetia Night Dept of Neurosurgery

## 2023-06-18 ENCOUNTER — Ambulatory Visit: Payer: Medicaid Other | Admitting: Family Medicine

## 2023-06-18 ENCOUNTER — Encounter: Payer: Self-pay | Admitting: Family Medicine

## 2023-06-20 ENCOUNTER — Emergency Department
Admission: EM | Admit: 2023-06-20 | Discharge: 2023-06-20 | Disposition: A | Discharge status: Home / Self Care | Payer: Medicaid Other | Attending: Emergency Medicine | Admitting: Emergency Medicine

## 2023-06-20 ENCOUNTER — Emergency Department: Payer: Medicaid Other

## 2023-06-20 ENCOUNTER — Other Ambulatory Visit: Payer: Self-pay

## 2023-06-20 DIAGNOSIS — E039 Hypothyroidism, unspecified: Secondary | ICD-10-CM | POA: Insufficient documentation

## 2023-06-20 DIAGNOSIS — Z981 Arthrodesis status: Secondary | ICD-10-CM | POA: Diagnosis not present

## 2023-06-20 DIAGNOSIS — R0789 Other chest pain: Secondary | ICD-10-CM | POA: Diagnosis not present

## 2023-06-20 DIAGNOSIS — R079 Chest pain, unspecified: Secondary | ICD-10-CM | POA: Diagnosis not present

## 2023-06-20 DIAGNOSIS — I10 Essential (primary) hypertension: Secondary | ICD-10-CM | POA: Insufficient documentation

## 2023-06-20 LAB — BASIC METABOLIC PANEL
Anion gap: 11 (ref 5–15)
BUN: 11 mg/dL (ref 6–20)
CO2: 26 mmol/L (ref 22–32)
Calcium: 8.9 mg/dL (ref 8.9–10.3)
Chloride: 101 mmol/L (ref 98–111)
Creatinine, Ser: 0.84 mg/dL (ref 0.44–1.00)
GFR, Estimated: >60 mL/min (ref >60)
Glucose, Bld: 140 mg/dL — ABNORMAL HIGH (ref 70–99)
Potassium: 3.3 mmol/L — ABNORMAL LOW (ref 3.5–5.1)
Sodium: 138 mmol/L (ref 135–145)

## 2023-06-20 LAB — CBC
HCT: 39.4 % (ref 36.0–46.0)
Hemoglobin: 13.4 g/dL (ref 12.0–15.0)
MCH: 29.7 pg (ref 26.0–34.0)
MCHC: 34 g/dL (ref 30.0–36.0)
MCV: 87.4 fL (ref 80.0–100.0)
Platelets: 260 10*3/uL (ref 150–400)
RBC: 4.51 MIL/uL (ref 3.87–5.11)
RDW: 12.9 % (ref 11.5–15.5)
WBC: 5.2 10*3/uL (ref 4.0–10.5)
nRBC: 0 % (ref 0.0–0.2)

## 2023-06-20 LAB — HEPATIC FUNCTION PANEL
ALT: 48 U/L — ABNORMAL HIGH (ref 0–44)
AST: 26 U/L (ref 15–41)
Albumin: 4 g/dL (ref 3.5–5.0)
Alkaline Phosphatase: 59 U/L (ref 38–126)
Bilirubin, Direct: 0.1 mg/dL (ref 0.0–0.2)
Indirect Bilirubin: 1 mg/dL — ABNORMAL HIGH (ref 0.3–0.9)
Total Bilirubin: 1.1 mg/dL (ref 0.3–1.2)
Total Protein: 7.6 g/dL (ref 6.5–8.1)

## 2023-06-20 LAB — D-DIMER, QUANTITATIVE: D-Dimer, Quant: <0.27 ug{FEU}/mL (ref 0.00–0.50)

## 2023-06-20 LAB — TROPONIN I (HIGH SENSITIVITY)
Troponin I (High Sensitivity): 3 ng/L (ref <18)
Troponin I (High Sensitivity): 2 ng/L (ref <18)

## 2023-06-20 LAB — LIPASE, BLOOD: Lipase: 31 U/L (ref 11–51)

## 2023-06-20 MED ORDER — KETOROLAC TROMETHAMINE 15 MG/ML IJ SOLN
15.0000 mg | Freq: Once | INTRAMUSCULAR | Status: AC
  Administered 2023-06-20: 15 mg via INTRAVENOUS
  Filled 2023-06-20: qty 1

## 2023-06-20 NOTE — ED Provider Notes (Signed)
[facility] Provider Note    Event Date/Time   First MD Initiated Contact with Patient 06/20/23 1819     (approximate)   History   Chest Pain   HPI  Morgan Stevens is a 57 y.o. female   Past medical history of hypertension, hyperlipidemia, hypothyroid who presents to the emergency department with pleuritic chest pain starting this morning.  She was diagnosed with COVID about 1 month ago had viral URI symptoms which mostly resolved except for an ongoing cough which has never resolved.     Pain only with deep breaths.  No more cough.  No fever or chills.  No GI or GU symptoms.  No history of blood clots, no leg swelling or pain.   External Medical Documents Reviewed: Echo of the heart done on 09/20/2022 showing normal ejection fraction 60 to 65%      Physical Exam   Triage Vital Signs: ED Triage Vitals  Encounter Vitals Group     BP 06/20/23 1355 133/78     Systolic BP Percentile --      Diastolic BP Percentile --      Pulse Rate 06/20/23 1355 84     Resp 06/20/23 1355 17     Temp 06/20/23 1355 97.9 F (36.6 C)     Temp Source 06/20/23 1355 Oral     SpO2 06/20/23 1355 99 %     Weight 06/20/23 1350 235 lb (106.6 kg)     Height 06/20/23 1350 5\' 4"  (1.626 m)     Head Circumference --      Peak Flow --      Pain Score 06/20/23 1350 7     Pain Loc --      Pain Education --      Exclude from Growth Chart --     Most recent vital signs: Vitals:   06/20/23 1818 06/20/23 2230  BP: 130/70 125/74  Pulse: 80 65  Resp: 16 16  Temp: 98 F (36.7 C) 98.2 F (36.8 C)  SpO2: 99% 96%    General: Awake, no distress.  CV:  Good peripheral perfusion.  Resp:  Normal effort.  Abd:  No distention.  Other:  Awake alert comfortable with normal vital signs afebrile clear lung sounds without focality or wheezing.  Oxygenation is 96% on room air.   ED Results / Procedures / Treatments   Labs (all labs ordered are listed, but only abnormal results are  displayed) Labs Reviewed  BASIC METABOLIC PANEL - Abnormal; Notable for the following components:      Result Value   Potassium 3.3 (*)    Glucose, Bld 140 (*)    All other components within normal limits  HEPATIC FUNCTION PANEL - Abnormal; Notable for the following components:   ALT 48 (*)    Indirect Bilirubin 1.0 (*)    All other components within normal limits  CBC  LIPASE, BLOOD  D-DIMER, QUANTITATIVE  TROPONIN I (HIGH SENSITIVITY)  TROPONIN I (HIGH SENSITIVITY)     I ordered and reviewed the above labs they are notable for troponin and D-dimer normal.  EKG  ED ECG REPORT I, Pilar Jarvis, the attending physician, personally viewed and interpreted this ECG.   Date: 06/20/2023  EKG Time: 1351  Rate: 86  Rhythm: sinus  Axis: nl   ST&T Change: no stemi, +pvc    RADIOLOGY I independently reviewed and interpreted chest x-ray and I see no obvious focalities or pneumothorax I also reviewed radiologist's formal read.  PROCEDURES:  Critical Care performed: No  Procedures   MEDICATIONS ORDERED IN ED: Medications  ketorolac (TORADOL) 15 MG/ML injection 15 mg (15 mg Intravenous Given 06/20/23 2103)     IMPRESSION / MDM / ASSESSMENT AND PLAN / ED COURSE  I reviewed the triage vital signs and the nursing notes.                                Patient's presentation is most consistent with acute presentation with potential threat to life or bodily function.  Differential diagnosis includes, but is not limited to, viral URI, bacterial pneumonia, pneumothorax, PE, ACS   The patient is on the cardiac monitor to evaluate for evidence of arrhythmia and/or significant heart rate changes.  MDM:    She has pleuritic chest pain after viral URI about 1 month ago with residual cough.  No signs of pneumothorax on chest x-ray.  Doubt ACS given very few cardiac risk factors and EKG is nonischemic and serial troponins have been negative.  I considered PE, got D-dimer which  was fortunately negative.  No signs of bacterial pneumonia on x-ray.  Patient is stable with normal vital signs.  Given above workup negative I doubt cardiopulmonary emergency at this time she will be discharged with close PMD follow-up.       FINAL CLINICAL IMPRESSION(S) / ED DIAGNOSES   Final diagnoses:  Nonspecific chest pain     Rx / DC Orders   ED Discharge Orders     None        Note:  This document was prepared using Dragon voice recognition software and may include unintentional dictation errors.    Pilar Jarvis, MD 06/20/23 [phone]

## 2023-06-20 NOTE — ED Triage Notes (Signed)
Pt presents to ED with c/o of R and L sided chest pain that started this morning. Pt endorses SOB.

## 2023-06-20 NOTE — Discharge Instructions (Signed)
Fortunately your testing in the emergency department did not show any emergency conditions like heart attack or blood clot, pneumonia, or punctured lung or rib fractures to explain your pain today.  Take acetaminophen 650 mg and ibuprofen 400 mg every 6 hours for pain.  Take with food.  Thank you for choosing [place] for your health care today!  Please see your primary doctor this week for a follow up appointment.   If you have any new, worsening, or unexpected symptoms call your doctor right away or come back to the emergency department for reevaluation.  It was my pleasure to care for you today.   Daneil Dan Modesto Charon, MD

## 2023-06-20 NOTE — ED Notes (Signed)
See triage note  Presents with some chest discomfort  States the discomfort is to righ and left side of ches  Sx's started this am   No fever or cough

## 2023-06-26 ENCOUNTER — Ambulatory Visit: Payer: Medicaid Other | Admitting: Family Medicine

## 2023-07-15 ENCOUNTER — Encounter: Payer: Self-pay | Admitting: Obstetrics and Gynecology

## 2023-07-16 ENCOUNTER — Ambulatory Visit: Payer: Medicaid Other | Admitting: Obstetrics and Gynecology

## 2023-07-16 ENCOUNTER — Encounter: Payer: Self-pay | Admitting: Obstetrics and Gynecology

## 2023-07-16 VITALS — BP 134/82 | HR 92 | Ht 64.0 in | Wt 247.0 lb

## 2023-07-16 DIAGNOSIS — N644 Mastodynia: Secondary | ICD-10-CM

## 2023-07-16 NOTE — Patient Instructions (Signed)
I value your feedback and you entrusting us with your care. If you get a [facility] patient survey, I would appreciate you taking the time to let us know about your experience today. Thank you! ? ? ?

## 2023-07-16 NOTE — Progress Notes (Addendum)
[name], DO   Chief Complaint  Patient presents with   Breast Exam    Pain on LB x 1-2 months    HPI:      [name] is a 57 y.o. [id number] whose LMP was Patient's last menstrual period was 07/11/2020., presents today for left breast pain for 1-2 months. Pain is dull or sharp, intermittent, and radiating through breast. Sx start medially and radiate outward. No erythema, trauma, masses, nipple d/c. Drinks ~32 oz tea/caffeine daily. Nothing to treat pain. PCP thought could be related to GERD but pt doesn't know if sx correlate. FH breast cancer but pt's insurance wont' cover cancer genetic testing. Neg mammo 01/03/23. Pt is postmenopausal.   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   GERD (gastroesophageal reflux disease) 01/21/2014   History of gestational diabetes 01/21/2014   Tonsillar hypertrophy 01/21/2014   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    Past Surgical History:  Procedure Laterality Date   ANTERIOR CERVICAL DECOMP/DISCECTOMY FUSION N/A 11/14/2022   Procedure: C5-7 ANTERIOR CERVICAL DISCECTOMY AND FUSION (NUVASIVE ACP, GLOBUS HEDRON);  Surgeon: Venetia Night, MD;  Location: [facility] ORS;  Service: Neurosurgery;  Laterality: N/A;   CARDIAC CATHETERIZATION  02/27/2016   Procedure: Left Heart Cath and Coronary Angiography;  Surgeon: Corky Crafts, MD;  Location: [facility]  INVASIVE CV LAB;  Service: Cardiovascular;;   CESAREAN SECTION     CHOLECYSTECTOMY     COLONOSCOPY WITH PROPOFOL N/A 04/15/2019   Procedure: COLONOSCOPY WITH PROPOFOL;  Surgeon: Pasty Spillers, MD;  Location: [facility] ENDOSCOPY;  Service: Endoscopy;  Laterality: N/A;   ESOPHAGOGASTRODUODENOSCOPY N/A 11/05/2022   Procedure: ESOPHAGOGASTRODUODENOSCOPY (EGD);  Surgeon: Jaynie Collins, DO;  Location[facility] ENDOSCOPY;  Service: Gastroenterology;  Laterality: N/A;   ESOPHAGOGASTRODUODENOSCOPY (EGD) WITH PROPOFOL N/A 04/15/2019   Procedure: ESOPHAGOGASTRODUODENOSCOPY (EGD) WITH PROPOFOL;  Surgeon: Pasty Spillers, MD;  Location: [facility] ENDOSCOPY;  Service: Endoscopy;  Laterality: N/A;   SHOULDER ARTHROSCOPY WITH DEBRIDEMENT AND BICEP TENDON REPAIR Left 02/06/2022   Procedure: Left shoulder arthroscopic debridement, subacromial decompression, open subpectoral biceps tenodesis, and  Regeneten Patch application;  Surgeon: Signa Kell, MD;  [facility];  Service: Orthopedics;  Laterality: Left;   THYROIDECTOMY  2018   THYROIDECTOMY      Family History  Problem Relation Age of Onset   Congestive Heart Failure Mother    Breast cancer Maternal Aunt        68s   Breast cancer Maternal Aunt        30s   Breast cancer Maternal Aunt        16s    Social History   Socioeconomic History   Marital status: Married    Spouse name: Mathis Fare   Number of children: 6   Years of education: Not on file   Highest education level: Associate degree: academic program  Occupational History   Occupation: home maker  Tobacco Use   Smoking status: Never   Smokeless tobacco: Never  Vaping Use   Vaping status: Never Used  Substance and Sexual Activity   Alcohol use: No    Alcohol/week: 0.0 standard drinks of alcohol   Drug use: No   Sexual activity: Yes    Birth control/protection: None  Other Topics Concern   Not on file  Social History Narrative   Home maker, married has 8  children, husband is a window Midwife   Social Determinants of Health   Financial Resource Strain: High Risk (03/26/2023)   Overall Financial Resource Strain (CARDIA)    Difficulty of Paying Living Expenses: Very hard  Food Insecurity: Food Insecurity Present (03/26/2023)   Hunger Vital Sign    Worried About Running Out of Food in the Last Year: Often true    Ran Out of Food in the Last Year: Often true  Transportation Needs: No Transportation Needs (03/26/2023)   PRAPARE - Administrator, Civil Service (Medical): No    Lack of Transportation (Non-Medical): No  Physical Activity: Unknown (03/26/2023)   Exercise Vital Sign    Days of Exercise per Week: 0 days    Minutes of Exercise per Session: Not on file  Stress: Stress Concern Present (03/26/2023)   [employer] of Occupational Health - Occupational Stress Questionnaire    Feeling of Stress : Very much  Social Connections: Moderately Integrated (03/26/2023)   Social Connection and Isolation Panel [NHANES]    Frequency of Communication with Friends and Family: Never    Frequency of Social Gatherings with Friends and Family: Never    Attends Religious Services: More than 4 times per year    Active Member of Clubs or Organizations: Yes    Attends Engineer, structural: More than 4 times per year    Marital Status: Married  Catering manager Violence: Not on file    Outpatient Medications Prior to Visit  Medication Sig Dispense Refill   amLODipine (NORVASC) 5 MG tablet Take 1 tablet (5 mg total) by mouth daily. 90 tablet 1   aspirin EC 81 MG tablet Take 81 mg by mouth at bedtime. Swallow whole.     atorvastatin (LIPITOR) 20 MG tablet TAKE 1 TABLET BY MOUTH AT BEDTIME 90 tablet 1   Calcium Carbonate-Simethicone (TUMS GAS RELIEF CHEWY BITES PO) Take by mouth as needed.     escitalopram (LEXAPRO) 10 MG tablet Take 1 tablet (10 mg total) by mouth daily. 30 tablet 2   esomeprazole (NEXIUM) 40 MG capsule Take 1 capsule  (40 mg total) by mouth 2 (two) times daily before a meal. 60 capsule 5   EUTHYROX 125 MCG tablet Take 1 tablet (125 mcg total) by mouth daily before breakfast. 90 tablet 3   famotidine (PEPCID) 20 MG tablet Take 1 tablet (20 mg total) by mouth 2 (two) times daily. 60 tablet 5   fluconazole (DIFLUCAN) 150 MG tablet Take 1 tablet (150 mg total) by mouth once a week. For 4 weeks 4 tablet 0   gabapentin (NEURONTIN) 300 MG capsule Take 1 capsule (300 mg total) by mouth 2 (two) times daily. 60 capsule 2   methocarbamol (ROBAXIN) 500 MG tablet Take 1 tablet (500 mg total) by mouth 4 (four) times daily. 120 tablet 0   montelukast (SINGULAIR) 10 MG tablet Take 1 tablet (10 mg total) by mouth at bedtime. 90 tablet 3   triamcinolone cream (KENALOG) 0.5 % Apply 1 Application topically 2 (two) times daily. To  affected areas, for up to 2 weeks. 30 g 0   VENTOLIN HFA 108 (90 Base) MCG/ACT inhaler INHALE 2 PUFFS BY MOUTH EVERY 4 HOURS AS NEEDED FOR WHEEZING FOR SHORTNESS OF BREATH 18 g 0   No facility-administered medications prior to visit.      ROS:  Review of Systems  Constitutional:  Negative for fever.  Gastrointestinal:  Negative for blood in stool, constipation, diarrhea, nausea and vomiting.  Genitourinary:  Negative for dyspareunia, dysuria, flank pain, frequency, hematuria, urgency, vaginal bleeding, vaginal discharge and vaginal pain.  Musculoskeletal:  Negative for back pain.  Skin:  Negative for rash.   BREAST: pain   OBJECTIVE:   Vitals:  BP 134/82   Pulse 92   Ht 5\' 4"  (1.626 m)   Wt 247 lb (112 kg)   LMP 07/11/2020   BMI 42.40 kg/m   Physical Exam Vitals reviewed.  Pulmonary:     Effort: Pulmonary effort is normal.  Chest:  Breasts:    Breasts are symmetrical.     Right: No inverted nipple, mass, nipple discharge, skin change or tenderness.     Left: Tenderness present. No inverted nipple, mass, nipple discharge or skin change.       Comments: LT BREAST PAIN RADIATES  TO OUTER QUADRANT; TENDER WITH PALPATION; NO MASSES; NO ERYTHEMA Musculoskeletal:        General: Normal range of motion.     Cervical back: Normal range of motion.  Skin:    General: Skin is warm and dry.  Neurological:     General: No focal deficit present.     Mental Status: She is alert and oriented to person, place, and time.     Cranial Nerves: No cranial nerve deficit.  Psychiatric:        Mood and Affect: Mood normal.        Behavior: Behavior normal.        Thought Content: Thought content normal.        Judgment: Judgment normal.     Assessment/Plan: Breast pain, left--for 1-2 months; neg exam except tenderness. Pt to d/c caffeine, add Vit E 400 units BID. See if sx correlate with GERD. If sx persist in a few wks, pt to f/u for dx mammo and u/s on that side.     Return if symptoms worsen or fail to improve.  Reola Buckles B. Jillann Charette, PA-C 07/16/2023 2:20 PM

## 2023-07-25 ENCOUNTER — Other Ambulatory Visit: Payer: Self-pay | Admitting: Family Medicine

## 2023-07-25 DIAGNOSIS — E7800 Pure hypercholesterolemia, unspecified: Secondary | ICD-10-CM

## 2023-07-28 ENCOUNTER — Other Ambulatory Visit: Payer: Self-pay | Admitting: Family Medicine

## 2023-07-28 DIAGNOSIS — E890 Postprocedural hypothyroidism: Secondary | ICD-10-CM

## 2023-07-28 DIAGNOSIS — L304 Erythema intertrigo: Secondary | ICD-10-CM

## 2023-07-29 NOTE — Telephone Encounter (Signed)
Requested Prescriptions  Pending Prescriptions Disp Refills   atorvastatin (LIPITOR) 20 MG tablet [Pharmacy Med Name: Atorvastatin Calcium 20 MG Oral Tablet] 90 tablet 0    Sig: TAKE 1 TABLET BY MOUTH AT BEDTIME     Cardiovascular:  Antilipid - Statins Failed - 07/25/2023  9:17 AM      Failed - Lipid Panel in normal range within the last 12 months    Cholesterol  Date Value Ref Range Status  12/10/2022 123 <200 mg/dL Final  16/05/9603 540 0 - 200 mg/dL Final   Ldl Cholesterol, Calc  Date Value Ref Range Status  05/08/2013 94 0 - 100 mg/dL Final   LDL Cholesterol (Calc)  Date Value Ref Range Status  12/10/2022 61 mg/dL (calc) Final    Comment:    Reference range: <100 . Desirable range <100 mg/dL for primary prevention;   <70 mg/dL for patients with CHD or diabetic patients  with > or = 2 CHD risk factors. Marland Kitchen LDL-C is now calculated using the Martin-Hopkins  calculation, which is a validated novel method providing  better accuracy than the Friedewald equation in the  estimation of LDL-C.  Horald Pollen et al. Lenox Ahr. 9811;914(78): 2061-2068  (http://education.[facility].com/faq/FAQ164)    HDL Cholesterol  Date Value Ref Range Status  05/08/2013 40 40 - 60 mg/dL Final   HDL  Date Value Ref Range Status  12/10/2022 44 (L) > OR = 50 mg/dL Final   Triglycerides  Date Value Ref Range Status  12/10/2022 94 <150 mg/dL Final  29/56/2130 865 0 - 200 mg/dL Final         Passed - Patient is not pregnant      Passed - Valid encounter within last 12 months    Recent Outpatient Visits           2 months ago Left medial knee pain   [place] [facility] [place], Netta Neat, DO   4 months ago Urinary frequency   [place] [facility] [place], Netta Neat, DO   7 months ago Annual physical exam   [place] [facility] [name], DO   9 months ago Pre-op examination   [facility] [name], DO   9 months ago Eosinophilic esophagitis   [place] [facility] [place], Netta Neat, [place]

## 2023-07-30 NOTE — Telephone Encounter (Signed)
Requested medication (s) are due for refill today: yes  Requested medication (s) are on the active medication list: yes  Last refill:  05/27/23  Future visit scheduled: no  Notes to clinic:  Unable to refill per protocol, cannot delegate.      Requested Prescriptions  Pending Prescriptions Disp Refills   triamcinolone cream (KENALOG) 0.5 % [Pharmacy Med Name: Triamcinolone Acetonide 0.5 % External Cream] 30 g 0    Sig: APPLY TO THE AFFECTED AREAS TWICE DAILY FOR UP TO TWO WEEKS     Not Delegated - Dermatology:  Corticosteroids Failed - 07/28/2023 12:51 AM      Failed - This refill cannot be delegated      Passed - Valid encounter within last 12 months    Recent Outpatient Visits           3 months ago Left medial knee pain   [place] [facility] [name], DO   4 months ago Urinary frequency   [place] [facility] [name], DO   7 months ago Annual physical exam   [place] [name], DO   9 months ago Pre-op examination   [place] [facility] [name], DO   10 months ago Eosinophilic esophagitis   [place] [facility] [place], Netta Neat, DO              Signed Prescriptions Disp Refills   EUTHYROX 125 MCG tablet 90 tablet 1    Sig: TAKE 1 TABLET BY MOUTH ONCE DAILY BEFORE BREAKFAST     Endocrinology:  Hypothyroid Agents Passed - 07/28/2023 12:51 AM      Passed - TSH in normal range and within 360 days    TSH  Date Value Ref Range Status  12/10/2022 0.81 mIU/L Final    Comment:              Reference Range .           > or = 20 Years  0.40-4.50 .                Pregnancy Ranges           First trimester    0.26-2.66           Second trimester   0.55-2.73           Third trimester    0.43-2.91          Passed - Valid encounter within last 12 months    Recent Outpatient Visits            3 months ago Left medial knee pain   [place] [facility] [place], Netta Neat, DO   4 months ago Urinary frequency   [place] [facility] [name], DO   7 months ago Annual physical exam   [place] [facility] [name], DO   9 months ago Pre-op examination   [facility] [name], DO   10 months ago Eosinophilic esophagitis   [place] [facility], Netta Neat, [place]

## 2023-07-30 NOTE — Telephone Encounter (Signed)
Requested Prescriptions  Pending Prescriptions Disp Refills   EUTHYROX 125 MCG tablet [Pharmacy Med Name: Euthyrox 125 MCG Oral Tablet] 90 tablet 0    Sig: TAKE 1 TABLET BY MOUTH ONCE DAILY BEFORE BREAKFAST     Endocrinology:  Hypothyroid Agents Passed - 07/28/2023 12:51 AM      Passed - TSH in normal range and within 360 days    TSH  Date Value Ref Range Status  12/10/2022 0.81 mIU/L Final    Comment:              Reference Range .           > or = 20 Years  0.40-4.50 .                Pregnancy Ranges           First trimester    0.26-2.66           Second trimester   0.55-2.73           Third trimester    0.43-2.91          Passed - Valid encounter within last 12 months    Recent Outpatient Visits           3 months ago Left medial knee pain   [place] [facility] [name], DO   4 months ago Urinary frequency   [place] [facility] [name], DO   7 months ago Annual physical exam   [place] [facility] [name], DO   9 months ago Pre-op examination   [place] [facility] [name], DO   10 months ago Eosinophilic esophagitis   [place] [facility], Netta Neat, DO               triamcinolone cream (KENALOG) 0.5 % [Pharmacy Med Name: Triamcinolone Acetonide 0.5 % External Cream] 30 g 0    Sig: APPLY TO THE AFFECTED AREAS TWICE DAILY FOR UP TO TWO WEEKS     Not Delegated - Dermatology:  Corticosteroids Failed - 07/28/2023 12:51 AM      Failed - This refill cannot be delegated      Passed - Valid encounter within last 12 months    Recent Outpatient Visits           3 months ago Left medial knee pain   [place] [facility] [name], DO   4 months ago Urinary frequency   [place] [facility] [place], Netta Neat, DO   7 months ago Annual  physical exam   [place] [facility] [name], DO   9 months ago Pre-op examination   [place] [facility] [name], DO   10 months ago Eosinophilic esophagitis   [place] [facility] [place], Netta Neat, [place]

## 2023-08-27 ENCOUNTER — Ambulatory Visit: Payer: Medicaid Other | Admitting: Family Medicine

## 2023-08-27 ENCOUNTER — Other Ambulatory Visit: Payer: Self-pay | Admitting: Family Medicine

## 2023-08-27 DIAGNOSIS — Z0000 Encounter for general adult medical examination without abnormal findings: Secondary | ICD-10-CM

## 2023-08-27 DIAGNOSIS — E890 Postprocedural hypothyroidism: Secondary | ICD-10-CM

## 2023-08-27 DIAGNOSIS — E7800 Pure hypercholesterolemia, unspecified: Secondary | ICD-10-CM

## 2023-08-27 DIAGNOSIS — R7303 Prediabetes: Secondary | ICD-10-CM

## 2023-08-27 LAB — POCT GLYCOSYLATED HEMOGLOBIN (HGB A1C): Hemoglobin A1C: 5.7 % — AB (ref 4.0–5.6)

## 2023-08-27 MED ORDER — SEMAGLUTIDE-WEIGHT MANAGEMENT 0.25 MG/0.5ML ~~LOC~~ SOAJ: 0.2500 mg | Freq: Once | SUBCUTANEOUS | Status: DC

## 2023-08-27 MED ORDER — SEMAGLUTIDE-WEIGHT MANAGEMENT 0.25 MG/0.5ML ~~LOC~~ SOAJ: 0.2500 mg | SUBCUTANEOUS | Status: DC

## 2023-08-27 NOTE — Patient Instructions (Addendum)
 Thank you for coming to the office today.  Recent Labs    10/19/22 1709  HGBA1C 5.9*   Wegovy  Sample  Weekly injection with meal, 0.25mg  dose, after 1 month we can dose increase to 0.5mg  and order to Medicaid if they will approve it.  --------------------------  [email or web address]  [employer] in [place], [place]  Address: [address], [place], [place] [address] Open 24 hours Phone: [phone]  Personal Trainer, Diet Program, 6 week course  ---------------------  For Weight Loss / Obesity only  Contrave - oral medication, appetite suppression has wellbutrin/bupropion and naltrexone in it and it can also help with appetite, it is ordered through a speciality pharmacy. - $99 per month  Free sample 7 day, 1 pill per day for 1 week  Alternative options  Semaglutide  injection (mixed Ozempic ) from [facility] Start 0.25mg  weekly for 4 weeks then increase to 0.5mg  weekly It comes in a vial and a needle syringe, you need to draw up the shot and self admin it weekly Cost is about $200 per month Call them to check pricing and availability  [facility] Address: [address] [place], [place], [place] [address] Phone: [phone]  --------------------------------  [facility] Online Virtual Doctor Team With insurance $79/mo + copay for medications Without insurance $79/mo + $99/mo to include medication (Semaglutide ) Without insurance $79/mo + $199/mo to include medication (Tirzepatide) [email or web address]-[email or web address]    DUE for FASTING BLOOD WORK (no food or drink after midnight before the lab appointment, only water or coffee without cream/sugar on the morning of)  SCHEDULE Lab Only visit in the morning at the clinic for lab draw in 4 MONTHS   - Make sure Lab Only appointment is at about 1 week before your next appointment, so that results will be available  For Lab Results, once available within 2-3 days of blood draw, you can can log  in to MyChart online to view your results and a brief explanation. Also, we can discuss results at next follow-up visit.   Please schedule a Follow-up Appointment to: Return in about 4 months (around 12/25/2023) for 4 month fasting lab > 1 week later Annual Physical.  If you have any other questions or concerns, please feel free to call the office or send a message through MyChart. You may also schedule an earlier appointment if necessary.  Additionally, you may be receiving a survey about your experience at our office within a few days to 1 week by e-mail or mail. We value your feedback.  Marsa Officer, DO [facility], [place]

## 2023-08-27 NOTE — Progress Notes (Addendum)
 Subjective:    Patient ID: Morgan Stevens, female    DOB: 1965/09/10, 57 y.o.   MRN: [id number]  Morgan Stevens is a 57 y.o. female presenting on 08/27/2023 for Medical Management of Chronic Issues   HPI  Discussed the use of AI scribe software for clinical note transcription with the patient, who gave verbal consent to proceed.  History of Present Illness    Morbid Obesity BMI >42 Pre-Diabetes Hypothyroidism  The patient, with a history of prediabetes and weight management issues, presents for a routine follow-up. She reports no recent blood work, with the last recorded blood sugar level at 5.9 in February. The patient's weight has been persistently between 230 to 240 pounds over the past couple of years, with minor fluctuations. She has sought nutritional advice [facility] Nutrition consult), but found the guidance insufficiently structured to her needs. She expresses a need for a more structured dietary plan and is open to exploring online programs for ongoing support and education. - She has tried Mediterranean Diet, and Keto diet. Now focusing on intermittent fasting. - Still with exercise regimen now focused on Home YouTube exercise regimen videos, mostly cardio.  The patient has previously considered weight loss medications, but coverage was an issue with her Medicaid plan. She expresses interest in trying a sample of Wegovy , a weight management medication, despite initial apprehension about injectable treatments. The patient is open to exploring other weight loss medications if necessary, depending on insurance coverage and her response to the Wegovy  sample.  The patient is proactive in managing her health, expressing willingness to explore various weight management strategies and medications.         08/27/2023    2:52 PM 05/01/2023    1:38 PM 12/25/2022    3:40 PM  Depression screen PHQ 2/9  Decreased Interest 2 1 2   Down, Depressed, Hopeless 2 2 2   PHQ - 2 Score 4  3 4   Altered sleeping 2 1 2   Tired, decreased energy 3 3 3   Change in appetite 3 2 2   Feeling bad or failure about yourself  3 2 2   Trouble concentrating 1 1 2   Moving slowly or fidgety/restless 0 0 0  Suicidal thoughts 0 0 0  PHQ-9 Score 16 12 15   Difficult doing work/chores  Very difficult        08/27/2023    2:53 PM 05/01/2023    1:39 PM 10/19/2022    2:23 PM 10/03/2022    3:10 PM  GAD 7 : Generalized Anxiety Score  Nervous, Anxious, on Edge 3 3 2 3   Control/stop worrying 3 3 2 3   Worry too much - different things 3 3 2 3   Trouble relaxing 3 2 2 3   Restless 0 0 1 0  Easily annoyed or irritable 3 3 2 3   Afraid - awful might happen 2 2 2 3   Total GAD 7 Score 17 16 13 18   Anxiety Difficulty   Somewhat difficult     Social History   Tobacco Use   Smoking status: Never   Smokeless tobacco: Never  Vaping Use   Vaping status: Never Used  Substance Use Topics   Alcohol use: No    Alcohol/week: 0.0 standard drinks of alcohol   Drug use: No    Review of Systems Per HPI unless specifically indicated above     Objective:    BP 124/78   Pulse 85   Ht 5' 4 (1.626 m)   Wt 246 lb (111.6  kg)   LMP 07/11/2020   SpO2 96%   BMI 42.23 kg/m   Wt Readings from Last 3 Encounters:  08/27/23 246 lb (111.6 kg)  07/16/23 247 lb (112 kg)  06/20/23 235 lb (106.6 kg)    Physical Exam Vitals and nursing note reviewed.  Constitutional:      General: She is not in acute distress.    Appearance: She is well-developed. She is obese. She is not diaphoretic.     Comments: Well-appearing, comfortable, cooperative  HENT:     Head: Normocephalic and atraumatic.  Eyes:     General:        Right eye: No discharge.        Left eye: No discharge.     Conjunctiva/sclera: Conjunctivae normal.  Neck:     Thyroid : No thyromegaly.  Cardiovascular:     Rate and Rhythm: Normal rate and regular rhythm.     Heart sounds: Normal heart sounds. No murmur heard. Pulmonary:     Effort:  Pulmonary effort is normal. No respiratory distress.     Breath sounds: Normal breath sounds. No wheezing or rales.  Musculoskeletal:        General: Normal range of motion.     Cervical back: Normal range of motion and neck supple.  Lymphadenopathy:     Cervical: No cervical adenopathy.  Skin:    General: Skin is warm and dry.     Findings: No erythema or rash.  Neurological:     Mental Status: She is alert and oriented to person, place, and time.  Psychiatric:        Behavior: Behavior normal.     Comments: Well groomed, good eye contact, normal speech and thoughts     Recent Labs    10/19/22 1709 08/27/23 1542  HGBA1C 5.9* 5.7*    Results for orders placed or performed in visit on 08/27/23  POCT HgB A1C   Collection Time: 08/27/23  3:42 PM  Result Value Ref Range   Hemoglobin A1C 5.7 (A) 4.0 - 5.6 %   HbA1c POC (<> result, manual entry)     HbA1c, POC (prediabetic range)     HbA1c, POC (controlled diabetic range)        Assessment & Plan:   Problem List Items Addressed This Visit     Morbid obesity (HCC) - Primary   Relevant Medications   Semaglutide -Weight Management 0.25 MG/0.5ML SOAJ   Postoperative hypothyroidism   Other Visit Diagnoses       Pre-diabetes       Relevant Orders   POCT HgB A1C (Completed)         Prediabetes A1c improved to 5.7, prior 5.9 Discussed the importance of regular monitoring and the potential progression to diabetes.  Morbid Obesity BMI >42 Complications Pre-Diabetes, (see above), Hypothyroidism Persistent weight in the 230-240 range over the past couple of years despite attempts at dietary changes.  Discussed the limitations of in-person nutritionist and the potential benefits of online programs and medically approved weight loss medications. -Encouraged to explore online nutritional programs for more structured dietary advice. - Continue intermittent fasting and encourage balance with mediterranean diet again in her  efforts to reduce calorie intake - Exercise goal to resume cardio exercise machines, and cycling if possible, in addition to home exercise videos goal >150 min moderate intensity exercise per week  -Provided a one-month sample of Wegovy  for trial. If successful and tolerated, will attempt to get it covered by insurance.  Continue Euthyrox  125mcg  daily Check labs prior to next visit  -Plan to reassess weight and discuss progress at yearly physical in April.      Orders Placed This Encounter  Procedures   POCT HgB A1C    Meds ordered this encounter  Medications   DISCONTD: Semaglutide -Weight Management SOAJ 0.25 mg   Semaglutide -Weight Management 0.25 MG/0.5ML SOAJ    Sig: Inject 0.25 mg into the skin once a week.    Lot Number?:   [id number]    Expiration Date?:   06/27/2024    Follow up plan: Return in about 4 months (around 12/25/2023) for 4 month fasting lab > 1 week later Annual Physical.  Future labs ordered for 12/26/23   Marsa Officer, DO [facility] 08/27/2023, 3:09 PM

## 2023-09-07 ENCOUNTER — Encounter: Payer: Self-pay | Admitting: Family Medicine

## 2023-09-09 DIAGNOSIS — Z01818 Encounter for other preprocedural examination: Secondary | ICD-10-CM | POA: Diagnosis not present

## 2023-09-09 DIAGNOSIS — R29898 Other symptoms and signs involving the musculoskeletal system: Secondary | ICD-10-CM | POA: Diagnosis not present

## 2023-09-09 DIAGNOSIS — M542 Cervicalgia: Secondary | ICD-10-CM | POA: Diagnosis not present

## 2023-09-09 DIAGNOSIS — K219 Gastro-esophageal reflux disease without esophagitis: Secondary | ICD-10-CM | POA: Diagnosis not present

## 2023-09-09 DIAGNOSIS — E785 Hyperlipidemia, unspecified: Secondary | ICD-10-CM | POA: Diagnosis not present

## 2023-09-09 DIAGNOSIS — Z6841 Body Mass Index (BMI) 40.0 and over, adult: Secondary | ICD-10-CM | POA: Diagnosis not present

## 2023-09-09 DIAGNOSIS — Z79899 Other long term (current) drug therapy: Secondary | ICD-10-CM | POA: Diagnosis not present

## 2023-09-09 DIAGNOSIS — K449 Diaphragmatic hernia without obstruction or gangrene: Secondary | ICD-10-CM | POA: Diagnosis not present

## 2023-09-09 DIAGNOSIS — K222 Esophageal obstruction: Secondary | ICD-10-CM | POA: Diagnosis not present

## 2023-09-09 DIAGNOSIS — I10 Essential (primary) hypertension: Secondary | ICD-10-CM | POA: Diagnosis not present

## 2023-09-09 DIAGNOSIS — Z713 Dietary counseling and surveillance: Secondary | ICD-10-CM | POA: Diagnosis not present

## 2023-09-09 DIAGNOSIS — Z7689 Persons encountering health services in other specified circumstances: Secondary | ICD-10-CM | POA: Diagnosis not present

## 2023-09-10 ENCOUNTER — Encounter: Payer: Self-pay | Admitting: Family Medicine

## 2023-09-12 DIAGNOSIS — F4323 Adjustment disorder with mixed anxiety and depressed mood: Secondary | ICD-10-CM | POA: Diagnosis not present

## 2023-09-13 DIAGNOSIS — F4323 Adjustment disorder with mixed anxiety and depressed mood: Secondary | ICD-10-CM | POA: Diagnosis not present

## 2023-09-20 ENCOUNTER — Emergency Department
Admission: EM | Admit: 2023-09-20 | Discharge: 2023-09-20 | Disposition: A | Discharge status: Home / Self Care | Payer: Medicaid Other | Attending: Emergency Medicine | Admitting: Emergency Medicine

## 2023-09-20 ENCOUNTER — Other Ambulatory Visit: Payer: Self-pay

## 2023-09-20 ENCOUNTER — Emergency Department: Payer: Medicaid Other

## 2023-09-20 ENCOUNTER — Encounter: Payer: Self-pay | Admitting: Emergency Medicine

## 2023-09-20 DIAGNOSIS — Z79899 Other long term (current) drug therapy: Secondary | ICD-10-CM | POA: Diagnosis not present

## 2023-09-20 DIAGNOSIS — Z7982 Long term (current) use of aspirin: Secondary | ICD-10-CM | POA: Diagnosis not present

## 2023-09-20 DIAGNOSIS — Z20822 Contact with and (suspected) exposure to covid-19: Secondary | ICD-10-CM | POA: Insufficient documentation

## 2023-09-20 DIAGNOSIS — I10 Essential (primary) hypertension: Secondary | ICD-10-CM | POA: Insufficient documentation

## 2023-09-20 DIAGNOSIS — J101 Influenza due to other identified influenza virus with other respiratory manifestations: Secondary | ICD-10-CM | POA: Insufficient documentation

## 2023-09-20 DIAGNOSIS — E039 Hypothyroidism, unspecified: Secondary | ICD-10-CM | POA: Diagnosis not present

## 2023-09-20 DIAGNOSIS — R059 Cough, unspecified: Secondary | ICD-10-CM | POA: Diagnosis not present

## 2023-09-20 DIAGNOSIS — R531 Weakness: Secondary | ICD-10-CM | POA: Diagnosis not present

## 2023-09-20 DIAGNOSIS — R509 Fever, unspecified: Secondary | ICD-10-CM | POA: Diagnosis not present

## 2023-09-20 DIAGNOSIS — R000 Tachycardia, unspecified: Secondary | ICD-10-CM | POA: Diagnosis not present

## 2023-09-20 LAB — RESP PANEL BY RT-PCR (RSV, FLU A&B, COVID)  RVPGX2
Influenza A by PCR: POSITIVE — AB
Influenza B by PCR: NEGATIVE
Resp Syncytial Virus by PCR: NEGATIVE
SARS Coronavirus 2 by RT PCR: NEGATIVE

## 2023-09-20 MED ORDER — ACETAMINOPHEN 325 MG PO TABS
975.0000 mg | ORAL_TABLET | Freq: Once | ORAL | Status: AC
  Administered 2023-09-20: 975 mg via ORAL
  Filled 2023-09-20: qty 3

## 2023-09-20 NOTE — Discharge Instructions (Signed)
You may alternate Tylenol 1000 mg every 6 hours as needed for fever and pain and ibuprofen 800 mg every 6-8 hours as needed for fever and pain. Please rest and drink plenty of fluids. This is a viral illness causing your symptoms. You do not need antibiotics for a virus. You may use over-the-counter nasal saline spray and Afrin nasal saline spray as needed for nasal congestion. Please do not use Afrin for more than 3 days in a row. You may use guaifenesin and dextromethorphan as needed for cough.  You may use lozenges and Chloraseptic spray to help with sore throat.  Warm salt water gargles can also help with sore throat.  You may use over-the-counter Unisom (doxyalamine) or Benadryl (diphenhydramine) to help with sleep.  Please note that some combination medicines such as DayQuil and NyQuil have multiple medications in them.  Please make sure you look at all labels to ensure that you are not taking too much of any one particular medication.  Symptoms from a virus may take 7-14 days to run its course.  You may also use honey and salt water gargles to help with sore throat, cough, postnasal drainage.  The flu is treated like any other virus with supportive measures as listed above. At this time you are outside the treatment window for Tamiflu or Xofluza. These medications have to be taken within the first 48 hours of symptoms.  Tamiflu has many side effects including nausea, vomiting and diarrhea.

## 2023-09-20 NOTE — ED Provider Notes (Signed)
[facility] Provider Note    Event Date/Time   First MD Initiated Contact with Patient 09/20/23 [phone]     (approximate)   History   Fever and URI   HPI  Morgan Stevens is a 58 y.o. female with history of hypertension, hyperlipidemia, hypothyroidism who presents to the emergency department with fevers, cough, chills, body aches.  No vomiting or diarrhea.  Symptoms ongoing for 2 to 3 days.  States she felt very weak today.   History provided by patient, family.    Past Medical History:  Diagnosis Date   Anemia    Chronic headaches    several per week   Gastritis    GERD (gastroesophageal reflux disease)    Hiatal hernia    Hyperlipidemia    Hypertension    Hypothyroidism    Obesity (BMI 30-39.9)    Pre-diabetes    Reflux    Spinal stenosis    Thyroid disease    Vertigo    rare   Wears contact lenses     Past Surgical History:  Procedure Laterality Date   ANTERIOR CERVICAL DECOMP/DISCECTOMY FUSION N/A 11/14/2022   Procedure: C5-7 ANTERIOR CERVICAL DISCECTOMY AND FUSION (NUVASIVE ACP, GLOBUS HEDRON);  Surgeon: Venetia Night, MD;  Location: [facility] ORS;  Service: Neurosurgery;  Laterality: N/A;   CARDIAC CATHETERIZATION  02/27/2016   Procedure: Left Heart Cath and Coronary Angiography;  Surgeon: Corky Crafts, MD;  Location: [facility] INVASIVE CV LAB;  Service: Cardiovascular;;   CESAREAN SECTION     CHOLECYSTECTOMY     COLONOSCOPY WITH PROPOFOL N/A 04/15/2019   Procedure: COLONOSCOPY WITH PROPOFOL;  Surgeon: Pasty Spillers, MD;  Location: [facility] ENDOSCOPY;  Service: Endoscopy;  Laterality: N/A;   ESOPHAGOGASTRODUODENOSCOPY N/A 11/05/2022   Procedure: ESOPHAGOGASTRODUODENOSCOPY (EGD);  Surgeon: Jaynie Collins, DO;  Location: [facility] ENDOSCOPY;  Service: Gastroenterology;  Laterality: N/A;   ESOPHAGOGASTRODUODENOSCOPY (EGD) WITH PROPOFOL N/A 04/15/2019   Procedure: ESOPHAGOGASTRODUODENOSCOPY (EGD) WITH PROPOFOL;  Surgeon: Pasty Spillers, MD;  Location: [facility] ENDOSCOPY;  Service: Endoscopy;  Laterality: N/A;   SHOULDER ARTHROSCOPY WITH DEBRIDEMENT AND BICEP TENDON REPAIR Left 02/06/2022   Procedure: Left shoulder arthroscopic debridement, subacromial decompression, open subpectoral biceps tenodesis, and  Regeneten Patch application;  Surgeon: Signa Kell, MD;  Location: [facility];  Service: Orthopedics;  Laterality: Left;   THYROIDECTOMY  2018   THYROIDECTOMY      MEDICATIONS:  Prior to Admission medications   Medication Sig Start Date End Date Taking? Authorizing Provider  amLODipine (NORVASC) 5 MG tablet Take 1 tablet (5 mg total) by mouth daily. 04/30/23   Debbe Odea, MD  aspirin EC 81 MG tablet Take 81 mg by mouth at bedtime. Swallow whole.    [provider]  atorvastatin (LIPITOR) 20 MG tablet TAKE 1 TABLET BY MOUTH AT BEDTIME 07/29/23   Karamalegos, Netta Neat, DO  Calcium Carbonate-Simethicone (TUMS GAS RELIEF CHEWY BITES PO) Take by mouth as needed. Patient not taking: Reported on 08/27/2023    [provider]  escitalopram (LEXAPRO) 10 MG tablet Take 1 tablet (10 mg total) by mouth daily. 05/01/23   Karamalegos, Netta Neat, DO  esomeprazole (NEXIUM) 40 MG capsule Take 1 capsule (40 mg total) by mouth 2 (two) times daily before a meal. 02/07/23 08/06/23  Celso Amy, PA-C  EUTHYROX 125 MCG tablet TAKE 1 TABLET BY MOUTH ONCE DAILY BEFORE BREAKFAST 07/30/23   Karamalegos, Netta Neat, DO  famotidine (PEPCID) 20 MG tablet Take 1 tablet (20 mg total) by  mouth 2 (two) times daily. 02/07/23 08/06/23  Celso Amy, PA-C  fluconazole (DIFLUCAN) 150 MG tablet Take 1 tablet (150 mg total) by mouth once a week. For 4 weeks Patient not taking: Reported on 08/27/2023 05/27/23   [name], DO  gabapentin (NEURONTIN) 300 MG capsule Take 1 capsule (300 mg total) by mouth 2 (two) times daily. 10/03/22   Karamalegos, Netta Neat, DO  methocarbamol (ROBAXIN) 500 MG tablet Take 1  tablet (500 mg total) by mouth 4 (four) times daily. 11/14/22   Susanne Borders, PA  montelukast (SINGULAIR) 10 MG tablet Take 1 tablet (10 mg total) by mouth at bedtime. 12/25/22   Karamalegos, Netta Neat, DO  Semaglutide-Weight Management 0.25 MG/0.5ML SOAJ Inject 0.25 mg into the skin once a week. 08/27/23   Karamalegos, Netta Neat, DO  triamcinolone cream (KENALOG) 0.5 % APPLY TO THE AFFECTED AREAS TWICE DAILY FOR UP TO TWO WEEKS 07/31/23   Lorre Munroe, NP  VENTOLIN HFA 108 (90 Base) MCG/ACT inhaler INHALE 2 PUFFS BY MOUTH EVERY 4 HOURS AS NEEDED FOR WHEEZING FOR SHORTNESS OF BREATH 11/12/22   [name], DO    Physical Exam   Triage Vital Signs: ED Triage Vitals  Encounter Vitals Group     BP 09/20/23 0238 125/61     Systolic BP Percentile --      Diastolic BP Percentile --      Pulse Rate 09/20/23 0238 (!) 136     Resp 09/20/23 0238 20     Temp 09/20/23 0238 (!) 102.8 F (39.3 C)     Temp Source 09/20/23 0238 Oral     SpO2 09/20/23 0238 95 %     Weight 09/20/23 0236 238 lb (108 kg)     Height 09/20/23 0236 5\' 4"  (1.626 m)     Head Circumference --      Peak Flow --      Pain Score 09/20/23 0236 8     Pain Loc --      Pain Education --      Exclude from Growth Chart --     Most recent vital signs: Vitals:   09/20/23 0238 09/20/23 0439  BP: 125/61 (!) 100/58  Pulse: (!) 136 (!) 106  Resp: 20 16  Temp: (!) 102.8 F (39.3 C) 99.2 F (37.3 C)  SpO2: 95% 97%    CONSTITUTIONAL: Alert, responds appropriately to questions. Well-appearing; well-nourished HEAD: Normocephalic, atraumatic EYES: Conjunctivae clear, pupils appear equal, sclera nonicteric ENT: normal nose; moist mucous membranes NECK: Supple, normal ROM CARD: RRR; S1 and S2 appreciated RESP: Normal chest excursion without splinting or tachypnea; breath sounds clear and equal bilaterally; no wheezes, no rhonchi, no rales, no hypoxia or respiratory distress, speaking full sentences ABD/GI:  Non-distended; soft, non-tender, no rebound, no guarding, no peritoneal signs BACK: The back appears normal EXT: Normal ROM in all joints; no deformity noted, no edema SKIN: Normal color for age and race; warm; no rash on exposed skin NEURO: Moves all extremities equally, normal speech PSYCH: The patient's mood and manner are appropriate.   ED Results / Procedures / Treatments   LABS: (all labs ordered are listed, but only abnormal results are displayed) Labs Reviewed  RESP PANEL BY RT-PCR (RSV, FLU A&B, COVID)  [id number] - Abnormal; Notable for the following components:      Result Value   Influenza A by PCR POSITIVE (*)    All other components within normal limits     EKG:  EKG Interpretation Date/Time:  Friday September 20 2023 02:38:51 EST Ventricular Rate:  137 PR Interval:  112 QRS Duration:  74 QT Interval:  370 QTC Calculation: 558 R Axis:   71  Text Interpretation: Sinus tachycardia Cannot rule out Anterior infarct , age undetermined Abnormal ECG When compared with ECG of 20-Jun-2023 13:51, Premature ventricular complexes are no longer Present Vent. rate has increased BY  51 BPM Minimal criteria for Anterior infarct are now Present Nonspecific T wave abnormality no longer evident in Lateral leads Confirmed by Rochele Raring [phone]) on 09/20/2023 7:14:24 AM         RADIOLOGY: My personal review and interpretation of imaging: Chest x-ray clear.  I have personally reviewed all radiology reports.   DG Chest 2 View Result Date: 09/20/2023 CLINICAL DATA:  Fever, cold like symptoms, generalized weakness and fatigue EXAM: CHEST - 2 VIEW COMPARISON:  06/20/2023 FINDINGS: The heart size and mediastinal contours are within normal limits. Both lungs are clear. The visualized skeletal structures are unremarkable. IMPRESSION: No active cardiopulmonary disease. Electronically Signed   By: Minerva Fester M.D.   On: 09/20/2023 03:12     PROCEDURES:  Critical Care performed:  No      Procedures    IMPRESSION / MDM / ASSESSMENT AND PLAN / ED COURSE  I reviewed the triage vital signs and the nursing notes.    Patient here with flulike symptoms.    DIFFERENTIAL DIAGNOSIS (includes but not limited to):   Viral illness, pneumonia, doubt meningitis, bacteremia, sepsis   Patient's presentation is most consistent with acute complicated illness / injury requiring diagnostic workup.   PLAN: Patient is positive for influenza A.  COVID, RSV negative.  Chest x-ray reviewed and interpreted by myself and the radiologist and is clear.  Patient was tachycardic when she was febrile but as her fever has defervesced her heart rate has improved.  Have offered her IV medications since she has an IV in place that was placed in triage to help her with symptomatic relief but she declines.  Discussed supportive care instructions and return precautions.  We discussed risk and benefits of antiviral such as Tamiflu and Xofluza but she declines these medications as well.   MEDICATIONS GIVEN IN ED: Medications  acetaminophen (TYLENOL) tablet 975 mg (975 mg Oral Given 09/20/23 0241)     ED COURSE:  At this time, I do not feel there is any life-threatening condition present. I reviewed all nursing notes, vitals, pertinent previous records.  All lab and urine results, EKGs, imaging ordered have been independently reviewed and interpreted by myself.  I reviewed all available radiology reports from any imaging ordered this visit.  Based on my assessment, I feel the patient is safe to be discharged home without further emergent workup and can continue workup as an outpatient as needed. Discussed all findings, treatment plan as well as usual and customary return precautions.  They verbalize understanding and are comfortable with this plan.  Outpatient follow-up has been provided as needed.  All questions have been answered.    CONSULTS:  none   OUTSIDE RECORDS REVIEWED: Reviewed recent  primary care doctor notes in January 2025.       FINAL CLINICAL IMPRESSION(S) / ED DIAGNOSES   Final diagnoses:  Influenza A     Rx / DC Orders   ED Discharge Orders     None        Note:  This document was prepared using Dragon voice recognition software and may include  unintentional dictation errors.   Roshunda Keir, Layla Maw, DO 09/20/23 [phone]

## 2023-09-20 NOTE — ED Triage Notes (Signed)
Patient brought in via [facility] tonight from home with complaints of fever, generalized weakness/fatigue, headache, and cold like symptoms. EMS reported fever of 103.2, they administered NS PTA. Patient has not had tylenol/ibuprofen.

## 2023-09-23 ENCOUNTER — Inpatient Hospital Stay
Admission: EM | Admit: 2023-09-23 | Discharge: 2023-09-26 | DRG: 871 | Disposition: A | Discharge status: Home / Self Care | Payer: Medicaid Other | Attending: Internal Medicine | Admitting: Internal Medicine

## 2023-09-23 ENCOUNTER — Inpatient Hospital Stay: Payer: Medicaid Other

## 2023-09-23 ENCOUNTER — Emergency Department: Payer: Medicaid Other

## 2023-09-23 ENCOUNTER — Other Ambulatory Visit: Payer: Self-pay

## 2023-09-23 DIAGNOSIS — Z79899 Other long term (current) drug therapy: Secondary | ICD-10-CM

## 2023-09-23 DIAGNOSIS — Z803 Family history of malignant neoplasm of breast: Secondary | ICD-10-CM | POA: Diagnosis not present

## 2023-09-23 DIAGNOSIS — Z7982 Long term (current) use of aspirin: Secondary | ICD-10-CM | POA: Diagnosis not present

## 2023-09-23 DIAGNOSIS — E890 Postprocedural hypothyroidism: Secondary | ICD-10-CM | POA: Diagnosis not present

## 2023-09-23 DIAGNOSIS — E785 Hyperlipidemia, unspecified: Secondary | ICD-10-CM | POA: Diagnosis present

## 2023-09-23 DIAGNOSIS — I10 Essential (primary) hypertension: Secondary | ICD-10-CM | POA: Diagnosis present

## 2023-09-23 DIAGNOSIS — E871 Hypo-osmolality and hyponatremia: Secondary | ICD-10-CM

## 2023-09-23 DIAGNOSIS — J111 Influenza due to unidentified influenza virus with other respiratory manifestations: Secondary | ICD-10-CM | POA: Diagnosis not present

## 2023-09-23 DIAGNOSIS — J9801 Acute bronchospasm: Secondary | ICD-10-CM

## 2023-09-23 DIAGNOSIS — Z981 Arthrodesis status: Secondary | ICD-10-CM | POA: Diagnosis not present

## 2023-09-23 DIAGNOSIS — E876 Hypokalemia: Secondary | ICD-10-CM | POA: Diagnosis present

## 2023-09-23 DIAGNOSIS — J189 Pneumonia, unspecified organism: Secondary | ICD-10-CM | POA: Diagnosis not present

## 2023-09-23 DIAGNOSIS — Z8249 Family history of ischemic heart disease and other diseases of the circulatory system: Secondary | ICD-10-CM | POA: Diagnosis not present

## 2023-09-23 DIAGNOSIS — R918 Other nonspecific abnormal finding of lung field: Secondary | ICD-10-CM | POA: Diagnosis not present

## 2023-09-23 DIAGNOSIS — R0781 Pleurodynia: Secondary | ICD-10-CM | POA: Diagnosis not present

## 2023-09-23 DIAGNOSIS — A419 Sepsis, unspecified organism: Principal | ICD-10-CM

## 2023-09-23 DIAGNOSIS — Z6841 Body Mass Index (BMI) 40.0 and over, adult: Secondary | ICD-10-CM | POA: Diagnosis not present

## 2023-09-23 DIAGNOSIS — K219 Gastro-esophageal reflux disease without esophagitis: Secondary | ICD-10-CM | POA: Diagnosis not present

## 2023-09-23 DIAGNOSIS — E878 Other disorders of electrolyte and fluid balance, not elsewhere classified: Secondary | ICD-10-CM | POA: Diagnosis not present

## 2023-09-23 DIAGNOSIS — J168 Pneumonia due to other specified infectious organisms: Secondary | ICD-10-CM | POA: Diagnosis not present

## 2023-09-23 DIAGNOSIS — R7303 Prediabetes: Secondary | ICD-10-CM | POA: Diagnosis present

## 2023-09-23 DIAGNOSIS — R6520 Severe sepsis without septic shock: Secondary | ICD-10-CM | POA: Diagnosis not present

## 2023-09-23 DIAGNOSIS — Z7985 Long-term (current) use of injectable non-insulin antidiabetic drugs: Secondary | ICD-10-CM | POA: Diagnosis not present

## 2023-09-23 DIAGNOSIS — R053 Chronic cough: Secondary | ICD-10-CM

## 2023-09-23 DIAGNOSIS — J159 Unspecified bacterial pneumonia: Secondary | ICD-10-CM | POA: Diagnosis not present

## 2023-09-23 DIAGNOSIS — J90 Pleural effusion, not elsewhere classified: Secondary | ICD-10-CM | POA: Diagnosis not present

## 2023-09-23 LAB — COMPREHENSIVE METABOLIC PANEL
ALT: 35 U/L (ref 0–44)
AST: 33 U/L (ref 15–41)
Albumin: 3 g/dL — ABNORMAL LOW (ref 3.5–5.0)
Alkaline Phosphatase: 81 U/L (ref 38–126)
Anion gap: 14 (ref 5–15)
BUN: 15 mg/dL (ref 6–20)
CO2: 20 mmol/L — ABNORMAL LOW (ref 22–32)
Calcium: 8 mg/dL — ABNORMAL LOW (ref 8.9–10.3)
Chloride: 86 mmol/L — ABNORMAL LOW (ref 98–111)
Creatinine, Ser: 1.01 mg/dL — ABNORMAL HIGH (ref 0.44–1.00)
GFR, Estimated: >60 mL/min (ref >60)
Glucose, Bld: 152 mg/dL — ABNORMAL HIGH (ref 70–99)
Potassium: 2.9 mmol/L — ABNORMAL LOW (ref 3.5–5.1)
Sodium: 120 mmol/L — ABNORMAL LOW (ref 135–145)
Total Bilirubin: 2.5 mg/dL — ABNORMAL HIGH (ref 0.0–1.2)
Total Protein: 7.4 g/dL (ref 6.5–8.1)

## 2023-09-23 LAB — BASIC METABOLIC PANEL
Anion gap: 11 (ref 5–15)
BUN: 11 mg/dL (ref 6–20)
CO2: 21 mmol/L — ABNORMAL LOW (ref 22–32)
Calcium: 7.3 mg/dL — ABNORMAL LOW (ref 8.9–10.3)
Chloride: 92 mmol/L — ABNORMAL LOW (ref 98–111)
Creatinine, Ser: 0.71 mg/dL (ref 0.44–1.00)
GFR, Estimated: >60 mL/min (ref >60)
Glucose, Bld: 126 mg/dL — ABNORMAL HIGH (ref 70–99)
Potassium: 3.3 mmol/L — ABNORMAL LOW (ref 3.5–5.1)
Sodium: 124 mmol/L — ABNORMAL LOW (ref 135–145)

## 2023-09-23 LAB — TROPONIN I (HIGH SENSITIVITY)
Troponin I (High Sensitivity): 15 ng/L (ref <18)
Troponin I (High Sensitivity): 5 ng/L (ref <18)

## 2023-09-23 LAB — CBC WITH DIFFERENTIAL/PLATELET
Abs Immature Granulocytes: 0.12 10*3/uL — ABNORMAL HIGH (ref 0.00–0.07)
Basophils Absolute: 0.1 10*3/uL (ref 0.0–0.1)
Basophils Relative: 1 %
Eosinophils Absolute: 0.1 10*3/uL (ref 0.0–0.5)
Eosinophils Relative: 0 %
HCT: 33.6 % — ABNORMAL LOW (ref 36.0–46.0)
Hemoglobin: 12.2 g/dL (ref 12.0–15.0)
Immature Granulocytes: 1 %
Lymphocytes Relative: 6 %
Lymphs Abs: 1 10*3/uL (ref 0.7–4.0)
MCH: 30.2 pg (ref 26.0–34.0)
MCHC: 36.3 g/dL — ABNORMAL HIGH (ref 30.0–36.0)
MCV: 83.2 fL (ref 80.0–100.0)
Monocytes Absolute: 0.7 10*3/uL (ref 0.1–1.0)
Monocytes Relative: 4 %
Neutro Abs: 14.6 10*3/uL — ABNORMAL HIGH (ref 1.7–7.7)
Neutrophils Relative %: 88 %
Platelets: 246 10*3/uL (ref 150–400)
RBC: 4.04 MIL/uL (ref 3.87–5.11)
RDW: 12.6 % (ref 11.5–15.5)
WBC: 16.6 10*3/uL — ABNORMAL HIGH (ref 4.0–10.5)
nRBC: 0 % (ref 0.0–0.2)

## 2023-09-23 LAB — EXPECTORATED SPUTUM ASSESSMENT W GRAM STAIN, RFLX TO RESP C

## 2023-09-23 LAB — LACTIC ACID, PLASMA
Lactic Acid, Venous: 2.7 mmol/L (ref 0.5–1.9)
Lactic Acid, Venous: 1.9 mmol/L (ref 0.5–1.9)

## 2023-09-23 LAB — BILIRUBIN, DIRECT: Bilirubin, Direct: 1.1 mg/dL — ABNORMAL HIGH (ref 0.0–0.2)

## 2023-09-23 LAB — OSMOLALITY, URINE: Osmolality, Ur: 487 mosm/kg (ref 300–900)

## 2023-09-23 LAB — OSMOLALITY: Osmolality: 262 mosm/kg — ABNORMAL LOW (ref 275–295)

## 2023-09-23 LAB — TSH: TSH: 2.049 u[IU]/mL (ref 0.350–4.500)

## 2023-09-23 LAB — MRSA NEXT GEN BY PCR, NASAL: MRSA by PCR Next Gen: NOT DETECTED

## 2023-09-23 LAB — HIV ANTIBODY (ROUTINE TESTING W REFLEX): HIV Screen 4th Generation wRfx: NONREACTIVE

## 2023-09-23 LAB — SODIUM, URINE, RANDOM: Sodium, Ur: 11 mmol/L

## 2023-09-23 LAB — MAGNESIUM: Magnesium: 1.7 mg/dL (ref 1.7–2.4)

## 2023-09-23 MED ORDER — GUAIFENESIN 100 MG/5ML PO LIQD
5.0000 mL | Freq: Four times a day (QID) | ORAL | Status: DC
  Administered 2023-09-23 – 2023-09-26 (×13): 5 mL via ORAL
  Filled 2023-09-23 (×13): qty 10

## 2023-09-23 MED ORDER — POTASSIUM CHLORIDE CRYS ER 20 MEQ PO TBCR
40.0000 meq | EXTENDED_RELEASE_TABLET | Freq: Once | ORAL | Status: DC
  Filled 2023-09-23: qty 2

## 2023-09-23 MED ORDER — LACTATED RINGERS IV BOLUS
1000.0000 mL | Freq: Once | INTRAVENOUS | Status: DC
Start: 2023-09-23 — End: 2023-09-23

## 2023-09-23 MED ORDER — VANCOMYCIN HCL IN DEXTROSE 1-5 GM/200ML-% IV SOLN
1000.0000 mg | Freq: Once | INTRAVENOUS | Status: AC
  Administered 2023-09-23: 1000 mg via INTRAVENOUS
  Filled 2023-09-23: qty 200

## 2023-09-23 MED ORDER — OXYCODONE HCL 5 MG PO TABS
5.0000 mg | ORAL_TABLET | ORAL | Status: DC | PRN
  Administered 2023-09-23: 5 mg via ORAL
  Filled 2023-09-23: qty 1

## 2023-09-23 MED ORDER — FAMOTIDINE 20 MG PO TABS
20.0000 mg | ORAL_TABLET | Freq: Two times a day (BID) | ORAL | Status: DC
  Administered 2023-09-23 – 2023-09-26 (×7): 20 mg via ORAL
  Filled 2023-09-23 (×7): qty 1

## 2023-09-23 MED ORDER — POTASSIUM CHLORIDE 10 MEQ/100ML IV SOLN
10.0000 meq | INTRAVENOUS | Status: AC
  Administered 2023-09-23 (×2): 10 meq via INTRAVENOUS
  Filled 2023-09-23 (×2): qty 100

## 2023-09-23 MED ORDER — AZITHROMYCIN 250 MG PO TABS
500.0000 mg | ORAL_TABLET | Freq: Every day | ORAL | Status: DC
  Administered 2023-09-24 – 2023-09-26 (×3): 500 mg via ORAL
  Filled 2023-09-23: qty 1
  Filled 2023-09-23 (×2): qty 2

## 2023-09-23 MED ORDER — ENOXAPARIN SODIUM 60 MG/0.6ML IJ SOSY
0.5000 mg/kg | PREFILLED_SYRINGE | INTRAMUSCULAR | Status: DC
  Administered 2023-09-23 – 2023-09-25 (×3): 55 mg via SUBCUTANEOUS
  Filled 2023-09-23 (×3): qty 0.6

## 2023-09-23 MED ORDER — ALBUTEROL SULFATE (2.5 MG/3ML) 0.083% IN NEBU: 3.0000 mL | INHALATION_SOLUTION | Freq: Four times a day (QID) | RESPIRATORY_TRACT | Status: DC | PRN

## 2023-09-23 MED ORDER — SODIUM CHLORIDE 0.9 % IV SOLN: INTRAVENOUS | Status: DC

## 2023-09-23 MED ORDER — SODIUM CHLORIDE 0.9 % IV SOLN
500.0000 mg | Freq: Once | INTRAVENOUS | Status: AC
  Administered 2023-09-23: 500 mg via INTRAVENOUS
  Filled 2023-09-23: qty 5

## 2023-09-23 MED ORDER — MONTELUKAST SODIUM 10 MG PO TABS
10.0000 mg | ORAL_TABLET | Freq: Every day | ORAL | Status: DC
  Administered 2023-09-23 – 2023-09-25 (×3): 10 mg via ORAL
  Filled 2023-09-23 (×3): qty 1

## 2023-09-23 MED ORDER — IBUPROFEN 600 MG PO TABS
600.0000 mg | ORAL_TABLET | Freq: Once | ORAL | Status: AC
  Administered 2023-09-23: 600 mg via ORAL
  Filled 2023-09-23: qty 1

## 2023-09-23 MED ORDER — ATORVASTATIN CALCIUM 20 MG PO TABS
20.0000 mg | ORAL_TABLET | Freq: Every day | ORAL | Status: DC
  Administered 2023-09-23 – 2023-09-25 (×3): 20 mg via ORAL
  Filled 2023-09-23 (×3): qty 1

## 2023-09-23 MED ORDER — METHOCARBAMOL 500 MG PO TABS: 500.0000 mg | ORAL_TABLET | Freq: Four times a day (QID) | ORAL | Status: DC

## 2023-09-23 MED ORDER — ACETAMINOPHEN 325 MG PO TABS
650.0000 mg | ORAL_TABLET | Freq: Once | ORAL | Status: AC
  Administered 2023-09-23: 650 mg via ORAL
  Filled 2023-09-23: qty 2

## 2023-09-23 MED ORDER — PANTOPRAZOLE SODIUM 40 MG PO TBEC
40.0000 mg | DELAYED_RELEASE_TABLET | Freq: Every day | ORAL | Status: DC
  Administered 2023-09-23 – 2023-09-26 (×4): 40 mg via ORAL
  Filled 2023-09-23 (×4): qty 1

## 2023-09-23 MED ORDER — SODIUM CHLORIDE 0.9 % IV SOLN
2.0000 g | Freq: Once | INTRAVENOUS | Status: AC
  Administered 2023-09-23: 2 g via INTRAVENOUS
  Filled 2023-09-23: qty 20

## 2023-09-23 MED ORDER — ONDANSETRON HCL 4 MG PO TABS: 4.0000 mg | ORAL_TABLET | Freq: Four times a day (QID) | ORAL | Status: DC | PRN

## 2023-09-23 MED ORDER — ACETAMINOPHEN 650 MG RE SUPP: 650.0000 mg | Freq: Four times a day (QID) | RECTAL | Status: DC | PRN

## 2023-09-23 MED ORDER — ESCITALOPRAM OXALATE 10 MG PO TABS: 10.0000 mg | ORAL_TABLET | Freq: Every day | ORAL | Status: DC

## 2023-09-23 MED ORDER — SODIUM CHLORIDE 0.9 % IV BOLUS
1000.0000 mL | Freq: Once | INTRAVENOUS | Status: AC
  Administered 2023-09-23: 1000 mL via INTRAVENOUS

## 2023-09-23 MED ORDER — ALBUTEROL SULFATE (2.5 MG/3ML) 0.083% IN NEBU
3.0000 mL | INHALATION_SOLUTION | Freq: Four times a day (QID) | RESPIRATORY_TRACT | Status: DC
  Administered 2023-09-23 – 2023-09-25 (×8): 3 mL via RESPIRATORY_TRACT
  Filled 2023-09-23 (×8): qty 3

## 2023-09-23 MED ORDER — GABAPENTIN 300 MG PO CAPS: 300.0000 mg | ORAL_CAPSULE | Freq: Two times a day (BID) | ORAL | Status: DC

## 2023-09-23 MED ORDER — ONDANSETRON HCL 4 MG/2ML IJ SOLN: 4.0000 mg | Freq: Four times a day (QID) | INTRAMUSCULAR | Status: DC | PRN

## 2023-09-23 MED ORDER — ASPIRIN 81 MG PO TBEC
81.0000 mg | DELAYED_RELEASE_TABLET | Freq: Every day | ORAL | Status: DC
Start: 2023-09-24 — End: 2023-09-26
  Administered 2023-09-24 – 2023-09-26 (×3): 81 mg via ORAL
  Filled 2023-09-23 (×3): qty 1

## 2023-09-23 MED ORDER — SODIUM CHLORIDE 0.9 % IV SOLN
2.0000 g | INTRAVENOUS | Status: DC
  Administered 2023-09-24 – 2023-09-26 (×3): 2 g via INTRAVENOUS
  Filled 2023-09-23 (×3): qty 20

## 2023-09-23 MED ORDER — MORPHINE SULFATE (PF) 4 MG/ML IV SOLN
4.0000 mg | Freq: Once | INTRAVENOUS | Status: AC
  Administered 2023-09-23: 4 mg via INTRAVENOUS
  Filled 2023-09-23: qty 1

## 2023-09-23 MED ORDER — OXYCODONE-ACETAMINOPHEN 5-325 MG PO TABS
1.0000 | ORAL_TABLET | ORAL | Status: DC | PRN
  Administered 2023-09-23: 1 via ORAL
  Filled 2023-09-23: qty 1

## 2023-09-23 MED ORDER — ACETAMINOPHEN 325 MG PO TABS
650.0000 mg | ORAL_TABLET | Freq: Four times a day (QID) | ORAL | Status: DC | PRN
  Administered 2023-09-23: 650 mg via ORAL
  Filled 2023-09-23: qty 2

## 2023-09-23 NOTE — ED Provider Notes (Signed)
[facility] Provider Note    Event Date/Time   First MD Initiated Contact with Patient 09/23/23 [id number]     (approximate)   History   Chief Complaint Shortness of Breath and Rib Injury   HPI  Morgan Stevens is a 58 y.o. female with past medical history of hypertension, hyperlipidemia, hypothyroidism, and GERD who presents to the ED complaining of shortness of breath and chest pain.  Patient reports that she has had a productive cough with mild difficulty breathing for the past 4 days, tested positive for influenza 3 days ago.  Since yesterday, she has been dealing with increasing sharp pain of the left side of her chest that is worse when she coughs or takes a deep breath.  She has also had subjective fevers and chills, reports feeling nauseous with decreased oral intake but has not had any vomiting or diarrhea.  She has not noticed any pain or swelling in her legs.     Physical Exam   Triage Vital Signs: ED Triage Vitals  Encounter Vitals Group     BP 09/23/23 0522 (!) 105/59     Systolic BP Percentile --      Diastolic BP Percentile --      Pulse Rate 09/23/23 0522 95     Resp 09/23/23 0522 (!) 25     Temp 09/23/23 0522 (!) 100.4 F (38 C)     Temp Source 09/23/23 0522 Oral     SpO2 09/23/23 0522 100 %     Weight 09/23/23 0523 238 lb (108 kg)     Height 09/23/23 0523 5\' 4"  (1.626 m)     Head Circumference --      Peak Flow --      Pain Score 09/23/23 0523 8     Pain Loc --      Pain Education --      Exclude from Growth Chart --     Most recent vital signs: Vitals:   09/23/23 0814 09/23/23 0829  BP:  135/81  Pulse:  (!) 108  Resp:  (!) 23  Temp: 99.6 F (37.6 C)   SpO2:  97%    Constitutional: Alert and oriented. Eyes: Conjunctivae are normal. Head: Atraumatic. Nose: No congestion/rhinnorhea. Mouth/Throat: Mucous membranes are moist.  Cardiovascular: Normal rate, regular rhythm. Grossly normal heart sounds.  2+ radial pulses  bilaterally. Respiratory: Tachypneic with mildly increased respiratory effort.  No retractions. Lungs CTAB.  Left chest wall tenderness to palpation noted. Gastrointestinal: Soft and nontender. No distention. Musculoskeletal: No lower extremity tenderness nor edema.  Neurologic:  Normal speech and language. No gross focal neurologic deficits are appreciated.    ED Results / Procedures / Treatments   Labs (all labs ordered are listed, but only abnormal results are displayed) Labs Reviewed  CBC WITH DIFFERENTIAL/PLATELET - Abnormal; Notable for the following components:      Result Value   WBC 16.6 (*)    HCT 33.6 (*)    MCHC 36.3 (*)    Neutro Abs 14.6 (*)    Abs Immature Granulocytes 0.12 (*)    All other components within normal limits  COMPREHENSIVE METABOLIC PANEL - Abnormal; Notable for the following components:   Sodium 120 (*)    Potassium 2.9 (*)    Chloride 86 (*)    CO2 20 (*)    Glucose, Bld 152 (*)    Creatinine, Ser 1.01 (*)    Calcium 8.0 (*)    Albumin 3.0 (*)  Total Bilirubin 2.5 (*)    All other components within normal limits  LACTIC ACID, PLASMA - Abnormal; Notable for the following components:   Lactic Acid, Venous 2.7 (*)    All other components within normal limits  CULTURE, BLOOD (ROUTINE X 2)  CULTURE, BLOOD (ROUTINE X 2)  LACTIC ACID, PLASMA  MAGNESIUM  OSMOLALITY, URINE  OSMOLALITY  SODIUM, URINE, RANDOM  TROPONIN I (HIGH SENSITIVITY)  TROPONIN I (HIGH SENSITIVITY)     EKG  ED ECG REPORT I, Chesley Noon, the attending physician, personally viewed and interpreted this ECG.   Date: 09/23/2023  EKG Time: 5:26  Rate: 125  Rhythm: sinus tachycardia  Axis: Normal  Intervals:none  ST&T Change: None  RADIOLOGY Chest x-ray reviewed and interpreted by me with left lower lobe infiltrate.  PROCEDURES:  Critical Care performed: Yes, see critical care procedure note(s)  .Critical Care  Performed by: Chesley Noon, MD Authorized  by: Chesley Noon, MD   Critical care provider statement:    Critical care time (minutes):  30   Critical care time was exclusive of:  Separately billable procedures and treating other patients and teaching time   Critical care was necessary to treat or prevent imminent or life-threatening deterioration of the following conditions:  Sepsis and metabolic crisis   Critical care was time spent personally by me on the following activities:  Development of treatment plan with patient or surrogate, discussions with consultants, evaluation of patient's response to treatment, examination of patient, ordering and review of laboratory studies, ordering and review of radiographic studies, ordering and performing treatments and interventions, pulse oximetry, re-evaluation of patient's condition and review of old charts   I assumed direction of critical care for this patient from another provider in my specialty: no     Care discussed with: admitting provider      MEDICATIONS ORDERED IN ED: Medications  oxyCODONE-acetaminophen (PERCOCET/ROXICET) 5-325 MG per tablet 1 tablet (1 tablet Oral Given 09/23/23 0527)  azithromycin (ZITHROMAX) 500 mg in sodium chloride 0.9 % 250 mL IVPB (500 mg Intravenous New Bag/Given 09/23/23 0851)  vancomycin (VANCOCIN) IVPB 1000 mg/200 mL premix (has no administration in time range)  potassium chloride SA (KLOR-CON M) CR tablet 40 mEq (40 mEq Oral Not Given 09/23/23 0911)  sodium chloride 0.9 % bolus 1,000 mL (has no administration in time range)  potassium chloride 10 mEq in 100 mL IVPB (has no administration in time range)  cefTRIAXone (ROCEPHIN) 2 g in sodium chloride 0.9 % 100 mL IVPB (0 g Intravenous Stopped 09/23/23 0907)  morphine (PF) 4 MG/ML injection 4 mg (4 mg Intravenous Given 09/23/23 0824)  acetaminophen (TYLENOL) tablet 650 mg (650 mg Oral Given 09/23/23 0822)  sodium chloride 0.9 % bolus 1,000 mL (1,000 mLs Intravenous New Bag/Given 09/23/23 0827)     IMPRESSION  / MDM / ASSESSMENT AND PLAN / ED COURSE  I reviewed the triage vital signs and the nursing notes.                              58 y.o. female with past medical history of hypertension, hyperlipidemia, hypothyroidism, and GERD who presents to the ED complaining of increasing difficulty breathing with left chest pain since testing positive for flu 3 days ago.  Patient's presentation is most consistent with acute presentation with potential threat to life or bodily function.  Differential diagnosis includes, but is not limited to, sepsis, pneumonia, influenza, COVID-19, ACS, PE, anemia, electrolyte  abnormality, AKI.  Patient nontoxic-appearing and in no acute distress, vital signs remarkable for fever at 100.4 as well as mild tachypnea and borderline hypotension.  Chest x-ray concerning for left lower lobe pneumonia and we will initiate sepsis workup with blood cultures and lactic acid.  Plan to cover for MRSA given recent flu diagnosis in addition to treatment for CAP, will treat symptomatically with IV morphine and hydrate with IV fluids.  Labs are pending at this time, EKG shows no evidence of arrhythmia or ischemia.  Labs show leukocytosis without significant anemia, patient also with significant hyponatremia at 120 as well as hypokalemia.  We will replete potassium and add on magnesium level, patient's pain improved on reassessment.  LFTs show mild elevation in bilirubin but are otherwise unremarkable.  Case discussed with hospitalist for admission.      FINAL CLINICAL IMPRESSION(S) / ED DIAGNOSES   Final diagnoses:  Sepsis without acute organ dysfunction, due to unspecified organism [facility])  Pneumonia of left lower lobe due to infectious organism  Hyponatremia     Rx / DC Orders   ED Discharge Orders     None        Note:  This document was prepared using Dragon voice recognition software and may include unintentional dictation errors.   Chesley Noon, MD 09/23/23 [phone]

## 2023-09-23 NOTE — ED Triage Notes (Signed)
Patient states she thinks she is dehydrated and in unable to take deep breaths due to left rib cage pain. States the pain started yesterday. She took ibuprofen at home with no relief. Denies injury to the left side. Patient is Flu positive. Patient states she was not given any medication for flu symptoms. She is taking mucinex at home. Takes a daily 81mg  ASA.

## 2023-09-23 NOTE — H&P (Signed)
History and Physical    Morgan Stevens ZOX:[id number] DOB: 1966-05-30 DOA: 09/23/2023  PCP: [name], DO (Confirm with patient/family/NH records and if not entered, this has to be entered at [facility] point of entry) Patient coming from: Home  I have personally briefly reviewed patient's old medical records in [facility]  Chief Complaint: Cough, SOB, chest pain  HPI: Morgan Stevens is a 58 y.o. female with medical history significant of HTN, HLD, hypothyroidism, prediabetes, presented with worsening of cough shortness of breath and chest pain.  Patient started to have URI-like symptoms with nasal congestion runny nose, sore throat and was diagnosed with flu 8 days ago.  Soon followed, she started develop productive cough with yellow sputum, last 2 days she also started develop sharp like chest pain 6 7-10 on the left side worsening with cough and deep breath.  Has had episode of fever and chills subjective at home.  Last 2 to 3 days have seen significant feeling of nausea but no vomiting and significant decrease of p.o. intake.  ED Course: Temperature 100.4, tachycardia blood pressure borderline low O2 saturation 97% on room air.  Chest x-ray showed left lower lobe pneumonia.  WBC 16.6, hemoglobin 12.2, K2.9, creatinine 1.0, chloride 86, sodium 120.  Patient was given breathing treatment, vancomycin cefepime and azithromycin in the ED.  Review of Systems: As per HPI otherwise 14 point review of systems negative.    Past Medical History:  Diagnosis Date   Anemia    Chronic headaches    several per week   Gastritis    GERD (gastroesophageal reflux disease)    Hiatal hernia    Hyperlipidemia    Hypertension    Hypothyroidism    Obesity (BMI 30-39.9)    Pre-diabetes    Reflux    Spinal stenosis    Thyroid disease    Vertigo    rare   Wears contact lenses     Past Surgical History:  Procedure Laterality Date   ANTERIOR CERVICAL DECOMP/DISCECTOMY FUSION N/A  11/14/2022   Procedure: C5-7 ANTERIOR CERVICAL DISCECTOMY AND FUSION (NUVASIVE ACP, GLOBUS HEDRON);  Surgeon: Venetia Night, MD;  Location: [facility] ORS;  Service: Neurosurgery;  Laterality: N/A;   CARDIAC CATHETERIZATION  02/27/2016   Procedure: Left Heart Cath and Coronary Angiography;  Surgeon: Corky Crafts, MD;  Location: [facility] INVASIVE CV LAB;  Service: Cardiovascular;;   CESAREAN SECTION     CHOLECYSTECTOMY     COLONOSCOPY WITH PROPOFOL N/A 04/15/2019   Procedure: COLONOSCOPY WITH PROPOFOL;  Surgeon: Pasty Spillers, MD;  Location: [facility] ENDOSCOPY;  Service: Endoscopy;  Laterality: N/A;   ESOPHAGOGASTRODUODENOSCOPY N/A 11/05/2022   Procedure: ESOPHAGOGASTRODUODENOSCOPY (EGD);  Surgeon: Jaynie Collins, DO;  Location: [facility] ENDOSCOPY;  Service: Gastroenterology;  Laterality: N/A;   ESOPHAGOGASTRODUODENOSCOPY (EGD) WITH PROPOFOL N/A 04/15/2019   Procedure: ESOPHAGOGASTRODUODENOSCOPY (EGD) WITH PROPOFOL;  Surgeon: Pasty Spillers, MD;  Location: [facility] ENDOSCOPY;  Service: Endoscopy;  Laterality: N/A;   SHOULDER ARTHROSCOPY WITH DEBRIDEMENT AND BICEP TENDON REPAIR Left 02/06/2022   Procedure: Left shoulder arthroscopic debridement, subacromial decompression, open subpectoral biceps tenodesis, and  Regeneten Patch application;  Surgeon: Signa Kell, MD;  Location: [facility];  Service: Orthopedics;  Laterality: Left;   THYROIDECTOMY  2018   THYROIDECTOMY       reports that she has never smoked. She has never used smokeless tobacco. She reports that she does not drink alcohol and does not use drugs.  No Known Allergies  Family History  Problem Relation Age  of Onset   Congestive Heart Failure Mother    Breast cancer Maternal Aunt        8s   Breast cancer Maternal Aunt        57s   Breast cancer Maternal Aunt        76s     Prior to Admission medications   Medication Sig Start Date End Date Taking? Authorizing Provider  Calcium Carbonate-Simethicone  (TUMS GAS RELIEF CHEWY BITES PO) Take by mouth as needed.   Yes [provider]  amLODipine (NORVASC) 5 MG tablet Take 1 tablet (5 mg total) by mouth daily. 04/30/23   Debbe Odea, MD  aspirin EC 81 MG tablet Take 81 mg by mouth at bedtime. Swallow whole.    [provider]  atorvastatin (LIPITOR) 20 MG tablet TAKE 1 TABLET BY MOUTH AT BEDTIME 07/29/23   Karamalegos, Alexander J, DO  escitalopram (LEXAPRO) 10 MG tablet Take 1 tablet (10 mg total) by mouth daily. 05/01/23   Karamalegos, Netta Neat, DO  esomeprazole (NEXIUM) 40 MG capsule Take 1 capsule (40 mg total) by mouth 2 (two) times daily before a meal. 02/07/23 08/06/23  Celso Amy, PA-C  EUTHYROX 125 MCG tablet TAKE 1 TABLET BY MOUTH ONCE DAILY BEFORE BREAKFAST 07/30/23   Karamalegos, Netta Neat, DO  famotidine (PEPCID) 20 MG tablet Take 1 tablet (20 mg total) by mouth 2 (two) times daily. 02/07/23 08/06/23  Celso Amy, PA-C  fluconazole (DIFLUCAN) 150 MG tablet Take 1 tablet (150 mg total) by mouth once a week. For 4 weeks Patient not taking: Reported on 08/27/2023 05/27/23   [name], DO  gabapentin (NEURONTIN) 300 MG capsule Take 1 capsule (300 mg total) by mouth 2 (two) times daily. 10/03/22   Karamalegos, Netta Neat, DO  methocarbamol (ROBAXIN) 500 MG tablet Take 1 tablet (500 mg total) by mouth 4 (four) times daily. 11/14/22   Susanne Borders, PA  montelukast (SINGULAIR) 10 MG tablet Take 1 tablet (10 mg total) by mouth at bedtime. 12/25/22   Karamalegos, Netta Neat, DO  Semaglutide-Weight Management 0.25 MG/0.5ML SOAJ Inject 0.25 mg into the skin once a week. 08/27/23   Karamalegos, Netta Neat, DO  triamcinolone cream (KENALOG) 0.5 % APPLY TO THE AFFECTED AREAS TWICE DAILY FOR UP TO TWO WEEKS 07/31/23   Lorre Munroe, NP  VENTOLIN HFA 108 (90 Base) MCG/ACT inhaler INHALE 2 PUFFS BY MOUTH EVERY 4 HOURS AS NEEDED FOR WHEEZING FOR SHORTNESS OF BREATH 11/12/22   [name], DO     Physical Exam: Vitals:   09/23/23 0829 09/23/23 0930 09/23/23 1013 09/23/23 1100  BP: 135/81 119/63 110/71 125/73  Pulse: (!) 108 (!) 107 (!) 107 (!) 108  Resp: (!) 23  18   Temp:      TempSrc:      SpO2: 97% 97% 98% 97%  Weight:      Height:        Constitutional: NAD, calm, comfortable Vitals:   09/23/23 0829 09/23/23 0930 09/23/23 1013 09/23/23 1100  BP: 135/81 119/63 110/71 125/73  Pulse: (!) 108 (!) 107 (!) 107 (!) 108  Resp: (!) 23  18   Temp:      TempSrc:      SpO2: 97% 97% 98% 97%  Weight:      Height:       Eyes: PERRL, lids and conjunctivae normal ENMT: Mucous membranes are dry. Posterior pharynx clear of any exudate or lesions.Normal dentition.  Neck: normal, supple, no masses,  no thyromegaly Respiratory: clear to auscultation bilaterally, no wheezing, coarse crackles on left lower field, increasing respiratory effort. No accessory muscle use.  Cardiovascular: Regular rate and rhythm, no murmurs / rubs / gallops. No extremity edema. 2+ pedal pulses. No carotid bruits.  Abdomen: no tenderness, no masses palpated. No hepatosplenomegaly. Bowel sounds positive.  Musculoskeletal: no clubbing / cyanosis. No joint deformity upper and lower extremities. Good ROM, no contractures. Normal muscle tone.  Skin: no rashes, lesions, ulcers. No induration Neurologic: CN 2-12 grossly intact. Sensation intact, DTR normal. Strength 5/5 in all 4.  Psychiatric: Normal judgment and insight. Alert and oriented x 3. Normal mood.    Labs on Admission: I have personally reviewed following labs and imaging studies  CBC: Recent Labs  Lab 09/23/23 0814  WBC 16.6*  NEUTROABS 14.6*  HGB 12.2  HCT 33.6*  MCV 83.2  PLT 246   Basic Metabolic Panel: Recent Labs  Lab 09/23/23 0814  NA 120*  K 2.9*  CL 86*  CO2 20*  GLUCOSE 152*  BUN 15  CREATININE 1.01*  CALCIUM 8.0*  MG 1.7   GFR: Estimated Creatinine Clearance: 75.5 mL/min (A) (by C-G formula based on SCr of 1.01  mg/dL (H)). Liver Function Tests: Recent Labs  Lab 09/23/23 0814  AST 33  ALT 35  ALKPHOS 81  BILITOT 2.5*  PROT 7.4  ALBUMIN 3.0*   No results for input(s): "LIPASE", "AMYLASE" in the last 168 hours. No results for input(s): "AMMONIA" in the last 168 hours. Coagulation Profile: No results for input(s): "INR", "PROTIME" in the last 168 hours. Cardiac Enzymes: No results for input(s): "CKTOTAL", "CKMB", "CKMBINDEX", "TROPONINI" in the last 168 hours. BNP (last 3 results) No results for input(s): "PROBNP" in the last 8760 hours. HbA1C: No results for input(s): "HGBA1C" in the last 72 hours. CBG: No results for input(s): "GLUCAP" in the last 168 hours. Lipid Profile: No results for input(s): "CHOL", "HDL", "LDLCALC", "TRIG", "CHOLHDL", "LDLDIRECT" in the last 72 hours. Thyroid Function Tests: Recent Labs    09/23/23 0958  TSH 2.049   Anemia Panel: No results for input(s): "VITAMINB12", "FOLATE", "FERRITIN", "TIBC", "IRON", "RETICCTPCT" in the last 72 hours. Urine analysis:    Component Value Date/Time   COLORURINE STRAW (A) 09/09/2022 0635   APPEARANCEUR CLEAR (A) 09/09/2022 0635   APPEARANCEUR Clear 09/30/2013 2333   LABSPEC 1.031 (H) 09/09/2022 0635   LABSPEC 1.011 09/30/2013 2333   PHURINE 7.0 09/09/2022 0635   GLUCOSEU NEGATIVE 09/09/2022 0635   GLUCOSEU Negative 09/30/2013 2333   HGBUR NEGATIVE 09/09/2022 0635   BILIRUBINUR Negative 03/26/2023 1102   BILIRUBINUR Negative 09/30/2013 2333   KETONESUR NEGATIVE 09/09/2022 0635   PROTEINUR Negative 03/26/2023 1102   PROTEINUR NEGATIVE 09/09/2022 0635   UROBILINOGEN 0.2 03/26/2023 1102   NITRITE Negative 03/26/2023 1102   NITRITE NEGATIVE 09/09/2022 0635   LEUKOCYTESUR Negative 03/26/2023 1102   LEUKOCYTESUR NEGATIVE 09/09/2022 0635   LEUKOCYTESUR Negative 09/30/2013 2333    Radiological Exams on Admission: DG Chest 2 View Result Date: 09/23/2023 CLINICAL DATA:  58 year old female with history of shortness of  breath. EXAM: CHEST - 2 VIEW COMPARISON:  Chest x-ray 09/20/2023. FINDINGS: Extensive airspace consolidation in the left lower lobe. Right lung is clear. No pleural effusions. No pneumothorax. No evidence of pulmonary edema. Heart size is normal. Upper mediastinal contours are within normal limits. Orthopedic fixation hardware in the lower cervical spine incidentally noted. IMPRESSION: 1. Left lower lobe pneumonia. Followup PA and lateral chest X-ray is recommended in 3-4 weeks  following trial of antibiotic therapy to ensure resolution and exclude underlying malignancy. Electronically Signed   By: Trudie Reed M.D.   On: 09/23/2023 06:01    EKG: Independently reviewed.  Sinus tachycardia, no acute ST changes.  Assessment/Plan Principal Problem:   Pneumonia Active Problems:   CAP (community acquired pneumonia)  (please populate well all problems here in Problem List. (For example, if patient is on BP meds at home and you resume or decide to hold them, it is a problem that needs to be her. Same for CAD, COPD, HLD and so on)  Sepsis, without acute endorgan damage LLL CAP, bacterial -Evidenced by tachycardia, leukocytosis, source of infection is left lower lobe pneumonia bacterial and postviral. -Continue current antibiotic regimen ceftriaxone and azithromycin -Sputum culture -Breathing treatment -Incentive spirometry and flutter valve -Repeat chest x-ray in 4 weeks to document resolution  Pleural chest pain -CT chest without contrast to further characterize the pleural chest pain.  To rule out empyema -Other DDx, EKG showed nonspecific ST changes on multiple leads, troponin negative x 2, ACS ruled out.  Hyponatremia -Acute with acute hypochloremia -Severe volume contraction with tachycardia and borderline hypotension -Plan to continue IV fluid, recheck sodium level this afternoon and tonight -Once volume status improved, will start to correct sodium level, probably late afternoon  today -Send hyponatremia study, including osmolarity and urine sodium  Hypokalemia -IV and p.o. replacement, recheck level this afternoon  HTN -Blood pressure borderline low, hold off home BP meds  Morbid obesity -BMI= 40, outpatient bariatric evaluation.  Hypothyroidism -Check TSH, continue Euthyrox  DVT prophylaxis: Lovenox Code Status: Full code Family Communication: None at bedside Disposition Plan: Patient is sick with sepsis and pneumonia, hyponatremia requiring close monitoring sodium correction, requiring IV antibiotics, expect more than 2 midnight hospital stay. Consults called: None Admission status: Telemetry admission   Emeline General MD [facility] Pager [phone]  09/23/2023, 11:24 AM

## 2023-09-23 NOTE — Sepsis Progress Note (Signed)
Elink monitoring for the code sepsis protocol.

## 2023-09-23 NOTE — Progress Notes (Signed)
CODE SEPSIS - PHARMACY COMMUNICATION  **Broad Spectrum Antibiotics should be administered within 1 hour of Sepsis diagnosis**  Time Code Sepsis Called/Page Received: 0812  Antibiotics Ordered: ceftriaxone, azithromycin  Time of 1st antibiotic administration: 0828  Additional action taken by pharmacy:    If necessary, Name of Provider/Nurse Contacted:      Angelique Blonder ,PharmD Clinical Pharmacist  09/23/2023  8:56 AM

## 2023-09-23 NOTE — ED Notes (Signed)
Per MD Chipper Herb, Give PRN 650mg  tylenol PO for fever 100.6

## 2023-09-23 NOTE — Progress Notes (Signed)
Na level improving 120>124  Medically patient still volume contracted with heart rate persistently more than 110.  Plan to continue maintenance IV fluid for another 8 hours then reevaluate in the morning.

## 2023-09-23 NOTE — ED Notes (Addendum)
Dr Larinda Buttery notified of lactic 2.7. orders to be placed as needed.

## 2023-09-23 NOTE — ED Notes (Signed)
MD made aware of pt's HR 110s.

## 2023-09-24 DIAGNOSIS — J189 Pneumonia, unspecified organism: Secondary | ICD-10-CM | POA: Diagnosis not present

## 2023-09-24 LAB — BLOOD CULTURE ID PANEL (REFLEXED) - BCID2

## 2023-09-24 LAB — CBC
HCT: 27.8 % — ABNORMAL LOW (ref 36.0–46.0)
Hemoglobin: 10.1 g/dL — ABNORMAL LOW (ref 12.0–15.0)
MCH: 30.1 pg (ref 26.0–34.0)
MCHC: 36.3 g/dL — ABNORMAL HIGH (ref 30.0–36.0)
MCV: 83 fL (ref 80.0–100.0)
Platelets: 280 10*3/uL (ref 150–400)
RBC: 3.35 MIL/uL — ABNORMAL LOW (ref 3.87–5.11)
RDW: 13.2 % (ref 11.5–15.5)
WBC: 13.2 10*3/uL — ABNORMAL HIGH (ref 4.0–10.5)
nRBC: 0 % (ref 0.0–0.2)

## 2023-09-24 LAB — BASIC METABOLIC PANEL
Anion gap: 10 (ref 5–15)
BUN: 9 mg/dL (ref 6–20)
CO2: 23 mmol/L (ref 22–32)
Calcium: 7.9 mg/dL — ABNORMAL LOW (ref 8.9–10.3)
Chloride: 98 mmol/L (ref 98–111)
Creatinine, Ser: 0.67 mg/dL (ref 0.44–1.00)
GFR, Estimated: >60 mL/min (ref >60)
Glucose, Bld: 120 mg/dL — ABNORMAL HIGH (ref 70–99)
Potassium: 2.8 mmol/L — ABNORMAL LOW (ref 3.5–5.1)
Sodium: 131 mmol/L — ABNORMAL LOW (ref 135–145)

## 2023-09-24 LAB — SODIUM: Sodium: 129 mmol/L — ABNORMAL LOW (ref 135–145)

## 2023-09-24 MED ORDER — IPRATROPIUM-ALBUTEROL 0.5-2.5 (3) MG/3ML IN SOLN: 3.0000 mL | RESPIRATORY_TRACT | Status: DC | PRN

## 2023-09-24 MED ORDER — LEVOTHYROXINE SODIUM 50 MCG PO TABS
125.0000 ug | ORAL_TABLET | Freq: Every day | ORAL | Status: DC
  Administered 2023-09-24 – 2023-09-26 (×3): 125 ug via ORAL
  Filled 2023-09-24 (×3): qty 1

## 2023-09-24 MED ORDER — AMLODIPINE BESYLATE 5 MG PO TABS
5.0000 mg | ORAL_TABLET | Freq: Every day | ORAL | Status: DC
  Administered 2023-09-24 – 2023-09-26 (×3): 5 mg via ORAL
  Filled 2023-09-24 (×3): qty 1

## 2023-09-24 MED ORDER — METHYLPREDNISOLONE SODIUM SUCC 40 MG IJ SOLR
40.0000 mg | Freq: Every day | INTRAMUSCULAR | Status: DC
  Administered 2023-09-24 – 2023-09-26 (×3): 40 mg via INTRAVENOUS
  Filled 2023-09-24 (×3): qty 1

## 2023-09-24 MED ORDER — POTASSIUM CHLORIDE CRYS ER 20 MEQ PO TBCR
40.0000 meq | EXTENDED_RELEASE_TABLET | ORAL | Status: AC
  Administered 2023-09-24 (×2): 40 meq via ORAL
  Filled 2023-09-24 (×2): qty 2

## 2023-09-24 NOTE — Progress Notes (Signed)
PHARMACY - PHYSICIAN COMMUNICATION CRITICAL VALUE ALERT - BLOOD CULTURE IDENTIFICATION (BCID)  Morgan Stevens is an 58 y.o. female who presented to [facility] on 09/23/2023 with a chief complaint of PNA.  Assessment:  Gram positive cocci growing in 1 of 4 bottles, Staph species, no resistance detected.  Most likely a contaminant.   (include suspected source if known)  Name of physician (or Provider) Contacted: Larkin Ina, NP   Current antibiotics: Azithromycin and ceftriaxone   Changes to prescribed antibiotics recommended:  Patient is on recommended antibiotics - No changes needed  No results found for this or any previous visit.  Wilbert Schouten D 09/24/2023  12:55 AM

## 2023-09-24 NOTE — Progress Notes (Signed)
PROGRESS NOTE    Morgan Stevens  ZOX:[id number] DOB: Jan 19, 1966 DOA: 09/23/2023 PCP: [name], DO    Brief Narrative:  58 y.o. female with medical history significant of HTN, HLD, hypothyroidism, prediabetes, presented with worsening of cough shortness of breath and chest pain.   Patient started to have URI-like symptoms with nasal congestion runny nose, sore throat and was diagnosed with flu 8 days ago.  Soon followed, she started develop productive cough with yellow sputum, last 2 days she also started develop sharp like chest pain 6 7-10 on the left side worsening with cough and deep breath.  Has had episode of fever and chills subjective at home.  Last 2 to 3 days have seen significant feeling of nausea but no vomiting and significant decrease of p.o. intake.   Assessment & Plan:   Principal Problem:   Pneumonia Active Problems:   CAP (community acquired pneumonia)  Sepsis, without acute endorgan damage LLL CAP, bacterial Influenza infection Patient had a positive influenza test on 1/24.  I suspect concomitant bacterial pneumonia and resultant sepsis.  Sepsis criteria met with tachycardia, leukocytosis. Plan: Continue CAP coverage with Rocephin and azithromycin Solu-Medrol 40 mg IV daily Sputum culture Bronchodilators Incentive spirometer and flutter valve Recommend repeat chest x-ray in 4 weeks after resolution of infection to ensure clearance  Pleural chest pain CT chest demonstrates multifocal pneumonia.  Low suspicion for ACS.  Suspect pleuritic chest pain in the setting of infection  Hyponatremia Hypochloremia Suspect volume contraction in the setting of sepsis and poor oral intake.  On IV fluids.    Hypokalemia Monitor and replace as needed   HTN -Blood pressure borderline low, hold off home BP meds   Morbid obesity BMI 40.  Complicating factor to overall care and prognosis   Hypothyroidism Continue thyroid replacement   DVT prophylaxis:  Lovenox Code Status: Full Family Communication: None Disposition Plan: Status is: Inpatient Remains inpatient appropriate because: Multifocal pneumonia   Level of care: Telemetry Medical  Consultants:  None  Procedures:  None  Antimicrobials: None   Subjective: Seen and examined.  Resting in bed.  Reports improvement in respiratory status since admission.  Continues to appear fatigued.  Objective: Vitals:   09/24/23 1224 09/24/23 1242 09/24/23 1313 09/24/23 1542  BP: 135/74 114/71  125/79  Pulse: (!) 113 (!) 107  (!) 104  Resp: 20 18  18   Temp: 99.1 F (37.3 C) 99.3 F (37.4 C)  99.3 F (37.4 C)  TempSrc: Oral Oral  Oral  SpO2: 96% 95% 97% 96%  Weight:      Height:        Intake/Output Summary (Last 24 hours) at 09/24/2023 1602 Last data filed at 09/23/2023 1729 Gross per 24 hour  Intake 469.22 ml  Output --  Net 469.22 ml   Filed Weights   09/23/23 0523  Weight: 108 kg    Examination:  General exam: Appears fatigued Respiratory system: Crackles at bases.  Normal work of breathing.  2 L Cardiovascular system: S1-S2, tachycardic, regular rhythm, no murmurs Gastrointestinal system: Obese, soft, NT ND, normal bowel sounds Central nervous system: Alert and oriented. No focal neurological deficits. Extremities: Symmetric 5 x 5 power. Skin: No rashes, lesions or ulcers Psychiatry: Judgement and insight appear normal. Mood & affect appropriate.     Data Reviewed: I have personally reviewed following labs and imaging studies  CBC: Recent Labs  Lab 09/23/23 0814 09/24/23 0635  WBC 16.6* 13.2*  NEUTROABS 14.6*  --   HGB 12.2  10.1*  HCT 33.6* 27.8*  MCV 83.2 83.0  PLT 246 280   Basic Metabolic Panel: Recent Labs  Lab 09/23/23 0814 09/23/23 1553 09/24/23 0012 09/24/23 0635  NA 120* 124* 129* 131*  K 2.9* 3.3*  --  2.8*  CL 86* 92*  --  98  CO2 20* 21*  --  23  GLUCOSE 152* 126*  --  120*  BUN 15 11  --  9  CREATININE 1.01* 0.71  --  0.67   CALCIUM 8.0* 7.3*  --  7.9*  MG 1.7  --   --   --    GFR: Estimated Creatinine Clearance: 95.3 mL/min (by C-G formula based on SCr of 0.67 mg/dL). Liver Function Tests: Recent Labs  Lab 09/23/23 0814  AST 33  ALT 35  ALKPHOS 81  BILITOT 2.5*  PROT 7.4  ALBUMIN 3.0*   No results for input(s): "LIPASE", "AMYLASE" in the last 168 hours. No results for input(s): "AMMONIA" in the last 168 hours. Coagulation Profile: No results for input(s): "INR", "PROTIME" in the last 168 hours. Cardiac Enzymes: No results for input(s): "CKTOTAL", "CKMB", "CKMBINDEX", "TROPONINI" in the last 168 hours. BNP (last 3 results) No results for input(s): "PROBNP" in the last 8760 hours. HbA1C: No results for input(s): "HGBA1C" in the last 72 hours. CBG: No results for input(s): "GLUCAP" in the last 168 hours. Lipid Profile: No results for input(s): "CHOL", "HDL", "LDLCALC", "TRIG", "CHOLHDL", "LDLDIRECT" in the last 72 hours. Thyroid Function Tests: Recent Labs    09/23/23 0958  TSH 2.049   Anemia Panel: No results for input(s): "VITAMINB12", "FOLATE", "FERRITIN", "TIBC", "IRON", "RETICCTPCT" in the last 72 hours. Sepsis Labs: Recent Labs  Lab 09/23/23 9629 09/23/23 0958  LATICACIDVEN 2.7* 1.9    Recent Results (from the past 240 hours)  Resp panel by RT-PCR (RSV, Flu A&B, Covid) Anterior Nasal Swab     Status: Abnormal   Collection Time: 09/20/23  2:40 AM   Specimen: Anterior Nasal Swab  Result Value Ref Range Status   SARS Coronavirus 2 by RT PCR NEGATIVE NEGATIVE Final    Comment: (NOTE) SARS-CoV-2 target nucleic acids are NOT DETECTED.  The SARS-CoV-2 RNA is generally detectable in upper respiratory specimens during the acute phase of infection. The lowest concentration of SARS-CoV-2 viral copies this assay can detect is 138 copies/mL. A negative result does not preclude SARS-Cov-2 infection and should not be used as the sole basis for treatment or other patient management  decisions. A negative result may occur with  improper specimen collection/handling, submission of specimen other than nasopharyngeal swab, presence of viral mutation(s) within the areas targeted by this assay, and inadequate number of viral copies(<138 copies/mL). A negative result must be combined with clinical observations, patient history, and epidemiological information. The expected result is Negative.  Fact Sheet for Patients:  BloggerCourse.com  Fact Sheet for Healthcare Providers:  SeriousBroker.it  This test is no t yet approved or cleared by the [place] FDA and  has been authorized for detection and/or diagnosis of SARS-CoV-2 by FDA under an Emergency Use Authorization (EUA). This EUA will remain  in effect (meaning this test can be used) for the duration of the COVID-19 declaration under Section 564(b)(1) of the Act, 21 U.S.C.section 360bbb-3(b)(1), unless the authorization is terminated  or revoked sooner.       Influenza A by PCR POSITIVE (A) NEGATIVE Final   Influenza B by PCR NEGATIVE NEGATIVE Final    Comment: (NOTE) The Xpert Xpress SARS-CoV-2/FLU/RSV plus  assay is intended as an aid in the diagnosis of influenza from Nasopharyngeal swab specimens and should not be used as a sole basis for treatment. Nasal washings and aspirates are unacceptable for Xpert Xpress SARS-CoV-2/FLU/RSV testing.  Fact Sheet for Patients: BloggerCourse.com  Fact Sheet for Healthcare Providers: SeriousBroker.it  This test is not yet approved or cleared by the [place] FDA and has been authorized for detection and/or diagnosis of SARS-CoV-2 by FDA under an Emergency Use Authorization (EUA). This EUA will remain in effect (meaning this test can be used) for the duration of the COVID-19 declaration under Section 564(b)(1) of the Act, 21 U.S.C. section 360bbb-3(b)(1), unless  the authorization is terminated or revoked.     Resp Syncytial Virus by PCR NEGATIVE NEGATIVE Final    Comment: (NOTE) Fact Sheet for Patients: BloggerCourse.com  Fact Sheet for Healthcare Providers: SeriousBroker.it  This test is not yet approved or cleared by the [place] FDA and has been authorized for detection and/or diagnosis of SARS-CoV-2 by FDA under an Emergency Use Authorization (EUA). This EUA will remain in effect (meaning this test can be used) for the duration of the COVID-19 declaration under Section 564(b)(1) of the Act, 21 U.S.C. section 360bbb-3(b)(1), unless the authorization is terminated or revoked.  Performed at [facility], [address] [place], [place] [address]   Blood culture (routine x 2)     Status: None (Preliminary result)   Collection Time: 09/23/23  8:14 AM   Specimen: BLOOD  Result Value Ref Range Status   Specimen Description BLOOD BLOOD RIGHT ARM  Final   Special Requests   Final    BOTTLES DRAWN AEROBIC AND ANAEROBIC Blood Culture results may not be optimal due to an inadequate volume of blood received in culture bottles   Culture   Final    NO GROWTH < 24 HOURS Performed at [facility], [address], [place], [place] [address]    Report Status PENDING  Incomplete  Blood culture (routine x 2)     Status: None (Preliminary result)   Collection Time: 09/23/23  8:14 AM   Specimen: BLOOD  Result Value Ref Range Status   Specimen Description BLOOD RIGHT ANTECUBITAL  Final   Special Requests   Final    BOTTLES DRAWN AEROBIC AND ANAEROBIC Blood Culture results may not be optimal due to an inadequate volume of blood received in culture bottles   Culture  Setup Time   Final    GRAM POSITIVE COCCI IN BOTH AEROBIC AND ANAEROBIC BOTTLES CRITICAL RESULT CALLED TO, READ BACK BY AND VERIFIED WITH:  JASON ROBBINS AT 8295 09/24/2023 JG Performed at [facility],  [address] [place], [place] [address]    Culture GRAM POSITIVE COCCI  Final   Report Status PENDING  Incomplete  Blood Culture ID Panel (Reflexed)     Status: Abnormal   Collection Time: 09/23/23  8:14 AM  Result Value Ref Range Status   Enterococcus faecalis NOT DETECTED NOT DETECTED Final   Enterococcus Faecium NOT DETECTED NOT DETECTED Final   Listeria monocytogenes NOT DETECTED NOT DETECTED Final   Staphylococcus species DETECTED (A) NOT DETECTED Final    Comment: CRITICAL RESULT CALLED TO, READ BACK BY AND VERIFIED WITH:  JASON ROBBINS AT 0052 09/24/2023 JG    Staphylococcus aureus (BCID) NOT DETECTED NOT DETECTED Final   Staphylococcus epidermidis NOT DETECTED NOT DETECTED Final   Staphylococcus lugdunensis NOT DETECTED NOT DETECTED Final   Streptococcus species NOT DETECTED NOT DETECTED  Final   Streptococcus agalactiae NOT DETECTED NOT DETECTED Final   Streptococcus pneumoniae NOT DETECTED NOT DETECTED Final   Streptococcus pyogenes NOT DETECTED NOT DETECTED Final   A.calcoaceticus-baumannii NOT DETECTED NOT DETECTED Final   Bacteroides fragilis NOT DETECTED NOT DETECTED Final   Enterobacterales NOT DETECTED NOT DETECTED Final   Enterobacter cloacae complex NOT DETECTED NOT DETECTED Final   Escherichia coli NOT DETECTED NOT DETECTED Final   Klebsiella aerogenes NOT DETECTED NOT DETECTED Final   Klebsiella oxytoca NOT DETECTED NOT DETECTED Final   Klebsiella pneumoniae NOT DETECTED NOT DETECTED Final   Proteus species NOT DETECTED NOT DETECTED Final   Salmonella species NOT DETECTED NOT DETECTED Final   Serratia marcescens NOT DETECTED NOT DETECTED Final   Haemophilus influenzae NOT DETECTED NOT DETECTED Final   Neisseria meningitidis NOT DETECTED NOT DETECTED Final   Pseudomonas aeruginosa NOT DETECTED NOT DETECTED Final   Stenotrophomonas maltophilia NOT DETECTED NOT DETECTED Final   Candida albicans NOT DETECTED NOT DETECTED Final   Candida auris NOT DETECTED NOT  DETECTED Final   Candida glabrata NOT DETECTED NOT DETECTED Final   Candida krusei NOT DETECTED NOT DETECTED Final   Candida parapsilosis NOT DETECTED NOT DETECTED Final   Candida tropicalis NOT DETECTED NOT DETECTED Final   Cryptococcus neoformans/gattii NOT DETECTED NOT DETECTED Final    Comment: Performed at [facility], [address] [place], [place] [address]  MRSA Next Gen by PCR, Nasal     Status: None   Collection Time: 09/23/23  3:53 PM   Specimen: Nasal Mucosa; Nasal Swab  Result Value Ref Range Status   MRSA by PCR Next Gen NOT DETECTED NOT DETECTED Final    Comment: (NOTE) The GeneXpert MRSA Assay (FDA approved for NASAL specimens only), is one component of a comprehensive MRSA colonization surveillance program. It is not intended to diagnose MRSA infection nor to guide or monitor treatment for MRSA infections. Test performance is not FDA approved in patients less than 70 years old. Performed at [facility], [address] [place], [place] [address]   Expectorated Sputum Assessment w Gram Stain, Rflx to Resp Cult     Status: None   Collection Time: 09/23/23  4:04 PM   Specimen: Sputum  Result Value Ref Range Status   Specimen Description SPUTUM  Final   Special Requests NONE  Final   Sputum evaluation   Final    THIS SPECIMEN IS ACCEPTABLE FOR SPUTUM CULTURE Performed at [facility], [address] [place], [place] [address]    Report Status 09/23/2023 FINAL  Final  Culture, Respiratory w Gram Stain     Status: None (Preliminary result)   Collection Time: 09/23/23  4:04 PM   Specimen: SPU  Result Value Ref Range Status   Specimen Description   Final    SPUTUM Performed at [facility], [address] [place], [place] [address]    Special Requests   Final    NONE Reflexed from [phone] Performed at [facility], [address] [place], [place] [address]    Gram Stain   Final    ABUNDANT SQUAMOUS EPITHELIAL  CELLS PRESENT ABUNDANT WBC PRESENT, PREDOMINANTLY PMN RARE YEAST FEW GRAM POSITIVE COCCI RARE GRAM NEGATIVE RODS    Culture   Final    TOO YOUNG TO READ Performed at [facility], [address] [place], [place] [address]    Report Status PENDING  Incomplete  Radiology Studies: CT CHEST WO CONTRAST Result Date: 09/23/2023 CLINICAL DATA:  Pleuritic left chest pain starting yesterday. Dehydration. EXAM: CT CHEST WITHOUT CONTRAST TECHNIQUE: Multidetector CT imaging of the chest was performed following the standard protocol without IV contrast. RADIATION DOSE REDUCTION: This exam was performed according to the departmental dose-optimization program which includes automated exposure control, adjustment of the mA and/or kV according to patient size and/or use of iterative reconstruction technique. COMPARISON:  CTA chest 04/10/2019 FINDINGS: Cardiovascular: Mild atheromatous vascular calcification of the aortic arch. Upper normal heart size. Mediastinum/Nodes: Scattered small mediastinal lymph nodes are likely reactive. Lungs/Pleura: Small left pleural effusion. Consolidation involving most of the left lower lobe with some scattered minimal sparing sparing. Smaller regions of consolidation in the left upper lobe and right lower lobe. 6 by 6 by 5 mm right middle lobe nodule on image 75 series 4. Tree-in-bud reticulonodular opacities in the left upper lobe. Upper Abdomen: Cholecystectomy. Musculoskeletal: Lower cervical plate and screw fixator. IMPRESSION: 1. Consolidation involving most of the left lower lobe with some scattered minimal sparing. Smaller regions of consolidation in the left upper lobe and right lower lobe. Tree-in-bud reticulonodular opacities in the left upper lobe. Findings are most compatible with multifocal pneumonia. Given the degree of consolidation and potential for obscuration of other underlying findings, surveillance imaging by chest radiograph or chest CT is  recommended in 3-4 weeks time after therapy for pneumonia. 2. Small left pleural effusion. 3. 6 by 6 by 5 mm (volume = 90 mm^3) right middle lobe nodule. This is most likely infectious/inflammatory. No follow-up needed for this particular finding if patient is low-risk.This recommendation follows the consensus statement: Guidelines for Management of Incidental Pulmonary Nodules Detected on CT Images: From the Fleischner Society 2017; Radiology 2017; 284:228-243. 4. Mild atheromatous vascular calcification of the aortic arch. 5. Lower cervical plate and screw fixator. Aortic Atherosclerosis (ICD10-I70.0). Electronically Signed   By: Gaylyn Rong M.D.   On: 09/23/2023 11:42   DG Chest 2 View Result Date: 09/23/2023 CLINICAL DATA:  58 year old female with history of shortness of breath. EXAM: CHEST - 2 VIEW COMPARISON:  Chest x-ray 09/20/2023. FINDINGS: Extensive airspace consolidation in the left lower lobe. Right lung is clear. No pleural effusions. No pneumothorax. No evidence of pulmonary edema. Heart size is normal. Upper mediastinal contours are within normal limits. Orthopedic fixation hardware in the lower cervical spine incidentally noted. IMPRESSION: 1. Left lower lobe pneumonia. Followup PA and lateral chest X-ray is recommended in 3-4 weeks following trial of antibiotic therapy to ensure resolution and exclude underlying malignancy. Electronically Signed   By: Trudie Reed M.D.   On: 09/23/2023 06:01        Scheduled Meds:  albuterol  3 mL Nebulization Q6H   amLODipine  5 mg Oral Daily   aspirin EC  81 mg Oral Daily   atorvastatin  20 mg Oral QHS   azithromycin  500 mg Oral Daily   enoxaparin (LOVENOX) injection  0.5 mg/kg Subcutaneous Q24H   famotidine  20 mg Oral BID   guaiFENesin  5 mL Oral Q6H   levothyroxine  125 mcg Oral Q0600   methylPREDNISolone (SOLU-MEDROL) injection  40 mg Intravenous Daily   montelukast  10 mg Oral QHS   pantoprazole  40 mg Oral Daily    Continuous Infusions:  cefTRIAXone (ROCEPHIN)  IV Stopped (09/24/23 1023)     LOS: 1 day      Tresa Moore, MD [facility]   If 7PM-7AM, please contact night-coverage  09/24/2023, 4:02 PM

## 2023-09-24 NOTE — Plan of Care (Signed)
  Problem: Activity: Goal: Risk for activity intolerance will decrease Outcome: Progressing   Problem: Nutrition: Goal: Adequate nutrition will be maintained Outcome: Progressing   Problem: Coping: Goal: Level of anxiety will decrease Outcome: Progressing   Problem: Pain Managment: Goal: General experience of comfort will improve and/or be controlled Outcome: Progressing

## 2023-09-24 NOTE — ED Notes (Signed)
Pt ambulated to and from the bathroom with minimal assistance, denies dizziness, gait steady

## 2023-09-25 DIAGNOSIS — J189 Pneumonia, unspecified organism: Secondary | ICD-10-CM

## 2023-09-25 DIAGNOSIS — A419 Sepsis, unspecified organism: Secondary | ICD-10-CM | POA: Diagnosis not present

## 2023-09-25 MED ORDER — ALBUTEROL SULFATE (2.5 MG/3ML) 0.083% IN NEBU
3.0000 mL | INHALATION_SOLUTION | Freq: Three times a day (TID) | RESPIRATORY_TRACT | Status: DC
  Administered 2023-09-25 – 2023-09-26 (×2): 3 mL via RESPIRATORY_TRACT
  Filled 2023-09-25 (×3): qty 3

## 2023-09-25 MED ORDER — POTASSIUM CHLORIDE CRYS ER 20 MEQ PO TBCR
40.0000 meq | EXTENDED_RELEASE_TABLET | ORAL | Status: AC
  Administered 2023-09-25 (×2): 40 meq via ORAL
  Filled 2023-09-25 (×2): qty 2

## 2023-09-25 MED ORDER — LORAZEPAM 2 MG/ML IJ SOLN
1.0000 mg | Freq: Once | INTRAMUSCULAR | Status: AC
  Administered 2023-09-25: 1 mg via INTRAVENOUS
  Filled 2023-09-25: qty 1

## 2023-09-25 NOTE — Hospital Course (Signed)
57yo with h/o HTN, HLD, hypothyroidism, and preDM who presented on 1/27 with CP/SOB.  She was diagnosed with sepsis due to PNA and started on Ceftriaxone/Azithromycin.

## 2023-09-25 NOTE — Progress Notes (Addendum)
Progress Note   Patient: Morgan Stevens WUJ:[id number] DOB: Feb 10, 1966 DOA: 09/23/2023     2 DOS: the patient was seen and examined on 09/25/2023   Brief hospital course: 57yo with h/o HTN, HLD, hypothyroidism, and preDM who presented on 1/27 with CP/SOB.  She was diagnosed with sepsis due to PNA and started on Ceftriaxone/Azithromycin.    Assessment and Plan:  Sepsis with acute endorgan damage due to multifocal PNA and recent influenza infection Patient had a positive influenza test on 1/24 Met sepsis criteria on admission with tachycardia, leukocytosis, lactate 2.7 -> 1.9, elevated bili Imaging with multifocal PNA Continue CAP coverage with Rocephin and azithromycin Solu-Medrol 40 mg IV daily Sputum culture pending Blood culture + with staph hominis/staph epidermidis - real vs. contaminant Continue bronchodilators Incentive spirometer and flutter valve Recommend repeat chest x-ray in 4 weeks after resolution of infection to ensure clearance   Pleural chest pain CT chest demonstrates multifocal pneumonia, which is likely the source of of chest pain Low suspicion for ACS   Hyponatremia/Hypochloremia Suspect volume contraction in the setting of sepsis and poor oral intake Improving with IVF    Hypokalemia Monitor and replace as needed   HTN Resume amlodipine   Morbid obesity Body mass index is 40.85 kg/m.Marland Kitchen  No longer on Ozempic Weight loss should be encouraged Outpatient PCP/bariatric medicine/bariatric surgery f/u encouraged    Hypothyroidism Continue Synthroid     Consultants: None  Procedures: None  Antibiotics: Ceftriaxone Azithromycin  30 Day Unplanned Readmission Risk Score    Flowsheet Row ED to Hosp-Admission (Current) from 09/23/2023 in [facility]  30 Day Unplanned Readmission Risk Score (%) 18.39 Filed at 09/25/2023 0801       This score is the patient's risk of an unplanned readmission within 30 days of being  discharged (0 -100%). The score is based on dignosis, age, lab data, medications, orders, and past utilization.   Low:  0-14.9   Medium: 15-21.9   High: 22-29.9   Extreme: 30 and above           Subjective: Feeling some better. Ongoing cough.  No O2 requirement at this time.   Objective: Vitals:   09/24/23 2101 09/25/23 0811  BP: 125/79 120/80  Pulse:  95  Resp:  18  Temp:  98.5 F (36.9 C)  SpO2:  95%    Intake/Output Summary (Last 24 hours) at 09/25/2023 0900 Last data filed at 09/25/2023 0600 Gross per 24 hour  Intake 1600 ml  Output --  Net 1600 ml   Filed Weights   09/23/23 0523  Weight: 108 kg    Exam:  General:  Appears calm and comfortable and is in NAD Eyes:  EOMI, normal lids, iris ENT:  grossly normal hearing, lips & tongue, mmm Neck:  no LAD, masses or thyromegaly Cardiovascular:  RRR, no m/r/g. No LE edema.  Respiratory:   L > R rhonchi.  Normal respiratory effort. Abdomen:  soft, NT, ND Skin:  no rash or induration seen on limited exam Musculoskeletal:  grossly normal tone BUE/BLE, good ROM, no bony abnormality Psychiatric:  grossly normal mood and affect, speech fluent and appropriate, AOx3 Neurologic:  CN 2-12 grossly intact, moves all extremities in coordinated fashion  Data Reviewed: I have reviewed the patient's lab results since admission.  Pertinent labs for today include:   Na++ 131 K+ 2.8 Glucose 120 WBC 13.2 Hgb 10.1 Sputum culture pending  Blood culture x1 positive GPC, BCID with staph   Family Communication:  None present  Disposition: Status is: Inpatient Remains inpatient appropriate because: ongoing management     Time spent: 50 minutes  Unresulted Labs (From admission, onward)    None        Author: Jonah Blue, MD 09/25/2023 9:00 AM  For on call review [email or web address].

## 2023-09-25 NOTE — TOC CM/SW Note (Signed)
Transition of Care [facility]) - Inpatient Brief Assessment   Patient Details  Name: Morgan Stevens MRN: [id number] Date of Birth: 1965-12-05  [facility] of [facility]) CM/SW Contact:    Margarito Liner, LCSW Phone Number: 09/25/2023, 11:49 AM   Clinical Narrative: CSW reviewed chart. No TOC needs identified so far. CSW will continue to follow progress. Please place [facility] consult if any needs arise.  Transition of Care Asessment: Insurance and Status: Insurance coverage has been reviewed Patient has primary care physician: Yes Home environment has been reviewed: Single family home Prior level of function:: Not documented Prior/Current Home Services: No current home services Social Drivers of Health Review: SDOH reviewed no interventions necessary Readmission risk has been reviewed: Yes Transition of care needs: no transition of care needs at this time

## 2023-09-26 ENCOUNTER — Encounter: Payer: Self-pay | Admitting: Family Medicine

## 2023-09-26 DIAGNOSIS — A419 Sepsis, unspecified organism: Secondary | ICD-10-CM | POA: Diagnosis not present

## 2023-09-26 DIAGNOSIS — J189 Pneumonia, unspecified organism: Secondary | ICD-10-CM | POA: Diagnosis not present

## 2023-09-26 DIAGNOSIS — I10 Essential (primary) hypertension: Secondary | ICD-10-CM | POA: Diagnosis present

## 2023-09-26 LAB — CBC WITH DIFFERENTIAL/PLATELET
Abs Immature Granulocytes: 0.45 10*3/uL — ABNORMAL HIGH (ref 0.00–0.07)
Basophils Absolute: 0 10*3/uL (ref 0.0–0.1)
Basophils Relative: 0 %
Eosinophils Absolute: 0 10*3/uL (ref 0.0–0.5)
Eosinophils Relative: 0 %
HCT: 29.6 % — ABNORMAL LOW (ref 36.0–46.0)
Hemoglobin: 10.2 g/dL — ABNORMAL LOW (ref 12.0–15.0)
Immature Granulocytes: 4 %
Lymphocytes Relative: 22 %
Lymphs Abs: 2.4 10*3/uL (ref 0.7–4.0)
MCH: 29.7 pg (ref 26.0–34.0)
MCHC: 34.5 g/dL (ref 30.0–36.0)
MCV: 86 fL (ref 80.0–100.0)
Monocytes Absolute: 0.8 10*3/uL (ref 0.1–1.0)
Monocytes Relative: 8 %
Neutro Abs: 7.4 10*3/uL (ref 1.7–7.7)
Neutrophils Relative %: 66 %
Platelets: 507 10*3/uL — ABNORMAL HIGH (ref 150–400)
RBC: 3.44 MIL/uL — ABNORMAL LOW (ref 3.87–5.11)
RDW: 13.7 % (ref 11.5–15.5)
WBC: 11.2 10*3/uL — ABNORMAL HIGH (ref 4.0–10.5)
nRBC: 0 % (ref 0.0–0.2)

## 2023-09-26 LAB — BASIC METABOLIC PANEL
Anion gap: 10 (ref 5–15)
BUN: 12 mg/dL (ref 6–20)
CO2: 26 mmol/L (ref 22–32)
Calcium: 8.2 mg/dL — ABNORMAL LOW (ref 8.9–10.3)
Chloride: 100 mmol/L (ref 98–111)
Creatinine, Ser: 0.66 mg/dL (ref 0.44–1.00)
GFR, Estimated: >60 mL/min (ref >60)
Glucose, Bld: 143 mg/dL — ABNORMAL HIGH (ref 70–99)
Potassium: 3.8 mmol/L (ref 3.5–5.1)
Sodium: 136 mmol/L (ref 135–145)

## 2023-09-26 LAB — CULTURE, BLOOD (ROUTINE X 2)

## 2023-09-26 LAB — CULTURE, RESPIRATORY W GRAM STAIN

## 2023-09-26 MED ORDER — PREDNISONE 20 MG PO TABS: 20.0000 mg | ORAL_TABLET | Freq: Every day | ORAL | 0 refills | Status: AC

## 2023-09-26 MED ORDER — VENTOLIN HFA 108 (90 BASE) MCG/ACT IN AERS: 2.0000 | INHALATION_SPRAY | RESPIRATORY_TRACT | 0 refills | Status: DC | PRN

## 2023-09-26 MED ORDER — AMOXICILLIN-POT CLAVULANATE 875-125 MG PO TABS: 1.0000 | ORAL_TABLET | Freq: Two times a day (BID) | ORAL | 0 refills | Status: DC

## 2023-09-26 MED ORDER — AZITHROMYCIN 500 MG PO TABS: ORAL_TABLET | ORAL | 0 refills | Status: AC

## 2023-09-26 MED ORDER — AMOXICILLIN-POT CLAVULANATE 875-125 MG PO TABS: 1.0000 | ORAL_TABLET | Freq: Two times a day (BID) | ORAL | Status: DC

## 2023-09-26 MED ORDER — GUAIFENESIN 100 MG/5ML PO LIQD: 5.0000 mL | Freq: Four times a day (QID) | ORAL | Status: DC

## 2023-09-26 NOTE — Plan of Care (Signed)
  Problem: Education: Goal: Knowledge of General Education information will improve Description: Including pain rating scale, medication(s)/side effects and non-pharmacologic comfort measures Outcome: Adequate for Discharge   Problem: Health Behavior/Discharge Planning: Goal: Ability to manage health-related needs will improve Outcome: Adequate for Discharge   Problem: Clinical Measurements: Goal: Ability to maintain clinical measurements within normal limits will improve Outcome: Adequate for Discharge Goal: Will remain free from infection Outcome: Adequate for Discharge Goal: Diagnostic test results will improve Outcome: Adequate for Discharge Goal: Respiratory complications will improve Outcome: Adequate for Discharge Goal: Cardiovascular complication will be avoided Outcome: Adequate for Discharge   Problem: Activity: Goal: Risk for activity intolerance will decrease Outcome: Adequate for Discharge   Problem: Nutrition: Goal: Adequate nutrition will be maintained Outcome: Adequate for Discharge   Problem: Coping: Goal: Level of anxiety will decrease Outcome: Adequate for Discharge   Problem: Elimination: Goal: Will not experience complications related to bowel motility Outcome: Adequate for Discharge Goal: Will not experience complications related to urinary retention Outcome: Adequate for Discharge   Problem: Pain Managment: Goal: General experience of comfort will improve and/or be controlled Outcome: Adequate for Discharge   Problem: Safety: Goal: Ability to remain free from injury will improve Outcome: Adequate for Discharge   Problem: Skin Integrity: Goal: Risk for impaired skin integrity will decrease Outcome: Adequate for Discharge  Pt ao x4, pt has all belongings. Pt has received all DC instructions. Pt taken down to lobby with staff via wheelchair. Pt taken home by husband

## 2023-09-26 NOTE — Discharge Summary (Signed)
Physician Discharge Summary   Patient: Morgan Stevens MRN: [id number] DOB: 10-03-1965  Admit date:     09/23/2023  Discharge date: 09/26/23  Discharge Physician: Jonah Blue   PCP: [name], DO   Recommendations at discharge:   Complete antibiotics - take Augmentin twice daily for 5 additional days starting tomorrow and take Azithromycin daily on 1/31 and 2/1 Take prednisone once daily on  1/31 and 2/1 You will need a follow up chest x-ray in 4 weeks to ensure resolution Follow up with Dr. Althea Charon in 1-2 weeks  Discharge Diagnoses: Principal Problem:   Sepsis due to pneumonia [facility]) Active Problems:   Morbid obesity (HCC)   Postoperative hypothyroidism   Multifocal pneumonia   Essential hypertension   Hospital Course: 58yo with h/o HTN, HLD, hypothyroidism, and preDM who presented on 1/27 with CP/SOB.  She was diagnosed with sepsis due to PNA and started on Ceftriaxone/Azithromycin.    Assessment and Plan:  Sepsis with acute endorgan damage due to multifocal PNA and recent influenza infection Patient had a positive influenza test on 1/24 Met sepsis criteria on admission with tachycardia, leukocytosis, lactate 2.7 -> 1.9, elevated bili Imaging with multifocal PNA Continue CAP coverage with Rocephin and azithromycin -> Augmentin x 5 additional days Solu-Medrol 40 mg IV daily -> prednisone for 5 total days Sputum culture pending Blood culture + with staph hominis/staph epidermidis - likely contaminant and does not need further investigation per Dr. Rivka Safer Continue bronchodilators Incentive spirometer and flutter valve Recommend repeat chest x-ray in 4 weeks after resolution of infection to ensure clearance   Pleuritic chest pain CT chest demonstrates multifocal pneumonia, which is likely the source of of chest pain Low suspicion for ACS Resolved   Hyponatremia/Hypochloremia Suspect volume contraction in the setting of sepsis and poor oral  intake Resolved    Hypokalemia Replaced, resolved   HTN Resume amlodipine   Morbid obesity Body mass index is 40.85 kg/m.Marland Kitchen  No longer on Ozempic Weight loss should be encouraged Outpatient PCP/bariatric medicine/bariatric surgery f/u encouraged    Hypothyroidism Continue Synthroid       Consultants: None   Procedures: None   Antibiotics: Ceftriaxone Azithromycin    Pain control - [place] Controlled Substance Reporting System database was reviewed. and patient was instructed, not to drive, operate heavy machinery, perform activities at heights, swimming or participation in water activities or provide baby-sitting services while on Pain, Sleep and Anxiety Medications; until their outpatient Physician has advised to do so again. Also recommended to not to take more than prescribed Pain, Sleep and Anxiety Medications.   Disposition: Home Diet recommendation:  Regular diet DISCHARGE MEDICATION: Allergies as of 09/26/2023   No Known Allergies      Medication List     STOP taking these medications    escitalopram 10 MG tablet Commonly known as: Lexapro   gabapentin 300 MG capsule Commonly known as: NEURONTIN   methocarbamol 500 MG tablet Commonly known as: ROBAXIN       TAKE these medications    amLODipine 5 MG tablet Commonly known as: NORVASC Take 1 tablet (5 mg total) by mouth daily.   amoxicillin-clavulanate 875-125 MG tablet Commonly known as: AUGMENTIN Take 1 tablet by mouth every 12 (twelve) hours for 5 days. Start taking on: September 27, 2023   aspirin EC 81 MG tablet Take 81 mg by mouth at bedtime. Swallow whole.   atorvastatin 20 MG tablet Commonly known as: LIPITOR TAKE 1 TABLET BY MOUTH AT BEDTIME   azithromycin  500 MG tablet Commonly known as: ZITHROMAX Take one tablet on 1/31 and one tablet on 2/1 Start taking on: September 27, 2023   esomeprazole 40 MG capsule Commonly known as: NEXIUM Take 1 capsule (40 mg total) by  mouth 2 (two) times daily before a meal.   Euthyrox 125 MCG tablet Generic drug: levothyroxine TAKE 1 TABLET BY MOUTH ONCE DAILY BEFORE BREAKFAST   famotidine 20 MG tablet Commonly known as: PEPCID Take 1 tablet (20 mg total) by mouth 2 (two) times daily.   guaiFENesin 100 MG/5ML liquid Commonly known as: ROBITUSSIN Take 5 mLs by mouth every 6 (six) hours.   montelukast 10 MG tablet Commonly known as: SINGULAIR Take 1 tablet (10 mg total) by mouth at bedtime.   predniSONE 20 MG tablet Commonly known as: DELTASONE Take 1 tablet (20 mg total) by mouth daily with breakfast for 2 days. Start taking on: September 27, 2023   TUMS GAS RELIEF CHEWY BITES PO Take by mouth as needed.   Ventolin HFA 108 (90 Base) MCG/ACT inhaler Generic drug: albuterol Inhale 2 puffs into the lungs every 4 (four) hours as needed for wheezing or shortness of breath. What changed: See the new instructions.        Discharge Exam:   Subjective: Feels much better, excited to go home today.   Objective: Vitals:   09/26/23 0730 09/26/23 0736  BP:  127/81  Pulse: 93 99  Resp: 16 18  Temp:  98.5 F (36.9 C)  SpO2: 93% 94%    Intake/Output Summary (Last 24 hours) at 09/26/2023 1329 Last data filed at 09/26/2023 0900 Gross per 24 hour  Intake 1240 ml  Output --  Net 1240 ml   Filed Weights   09/23/23 0523  Weight: 108 kg    Exam:  General:  Appears calm and comfortable and is in NAD, on RA Eyes:  EOMI, normal lids, iris ENT:  grossly normal hearing, lips & tongue, mmm Neck:  no LAD, masses or thyromegaly Cardiovascular:  RRR, no m/r/g. No LE edema.  Respiratory:   L > R rhonchi.  Normal respiratory effort. Abdomen:  soft, NT, ND Skin:  no rash or induration seen on limited exam Musculoskeletal:  grossly normal tone BUE/BLE, good ROM, no bony abnormality Psychiatric:  grossly normal mood and affect, speech fluent and appropriate, AOx3 Neurologic:  CN 2-12 grossly intact, moves all  extremities in coordinated fashion  Data Reviewed: I have reviewed the patient's lab results since admission.  Pertinent labs for today include:  Glucose 143 WBC 11.2 - improved Hgb 10.2 - stable Platelets 507 Sputum culture pending    Condition at discharge: improving  The results of significant diagnostics from this hospitalization (including imaging, microbiology, ancillary and laboratory) are listed below for reference.   Imaging Studies: CT CHEST WO CONTRAST Result Date: 09/23/2023 CLINICAL DATA:  Pleuritic left chest pain starting yesterday. Dehydration. EXAM: CT CHEST WITHOUT CONTRAST TECHNIQUE: Multidetector CT imaging of the chest was performed following the standard protocol without IV contrast. RADIATION DOSE REDUCTION: This exam was performed according to the departmental dose-optimization program which includes automated exposure control, adjustment of the mA and/or kV according to patient size and/or use of iterative reconstruction technique. COMPARISON:  CTA chest 04/10/2019 FINDINGS: Cardiovascular: Mild atheromatous vascular calcification of the aortic arch. Upper normal heart size. Mediastinum/Nodes: Scattered small mediastinal lymph nodes are likely reactive. Lungs/Pleura: Small left pleural effusion. Consolidation involving most of the left lower lobe with some scattered minimal sparing sparing. Smaller regions  of consolidation in the left upper lobe and right lower lobe. 6 by 6 by 5 mm right middle lobe nodule on image 75 series 4. Tree-in-bud reticulonodular opacities in the left upper lobe. Upper Abdomen: Cholecystectomy. Musculoskeletal: Lower cervical plate and screw fixator. IMPRESSION: 1. Consolidation involving most of the left lower lobe with some scattered minimal sparing. Smaller regions of consolidation in the left upper lobe and right lower lobe. Tree-in-bud reticulonodular opacities in the left upper lobe. Findings are most compatible with multifocal pneumonia.  Given the degree of consolidation and potential for obscuration of other underlying findings, surveillance imaging by chest radiograph or chest CT is recommended in 3-4 weeks time after therapy for pneumonia. 2. Small left pleural effusion. 3. 6 by 6 by 5 mm (volume = 90 mm^3) right middle lobe nodule. This is most likely infectious/inflammatory. No follow-up needed for this particular finding if patient is low-risk.This recommendation follows the consensus statement: Guidelines for Management of Incidental Pulmonary Nodules Detected on CT Images: From the Fleischner Society 2017; Radiology 2017; 284:228-243. 4. Mild atheromatous vascular calcification of the aortic arch. 5. Lower cervical plate and screw fixator. Aortic Atherosclerosis (ICD10-I70.0). Electronically Signed   By: Gaylyn Rong M.D.   On: 09/23/2023 11:42   DG Chest 2 View Result Date: 09/23/2023 CLINICAL DATA:  58 year old female with history of shortness of breath. EXAM: CHEST - 2 VIEW COMPARISON:  Chest x-ray 09/20/2023. FINDINGS: Extensive airspace consolidation in the left lower lobe. Right lung is clear. No pleural effusions. No pneumothorax. No evidence of pulmonary edema. Heart size is normal. Upper mediastinal contours are within normal limits. Orthopedic fixation hardware in the lower cervical spine incidentally noted. IMPRESSION: 1. Left lower lobe pneumonia. Followup PA and lateral chest X-ray is recommended in 3-4 weeks following trial of antibiotic therapy to ensure resolution and exclude underlying malignancy. Electronically Signed   By: Trudie Reed M.D.   On: 09/23/2023 06:01   DG Chest 2 View Result Date: 09/20/2023 CLINICAL DATA:  Fever, cold like symptoms, generalized weakness and fatigue EXAM: CHEST - 2 VIEW COMPARISON:  06/20/2023 FINDINGS: The heart size and mediastinal contours are within normal limits. Both lungs are clear. The visualized skeletal structures are unremarkable. IMPRESSION: No active  cardiopulmonary disease. Electronically Signed   By: Minerva Fester M.D.   On: 09/20/2023 03:12    Microbiology: Results for orders placed or performed during the hospital encounter of 09/23/23  Blood culture (routine x 2)     Status: None (Preliminary result)   Collection Time: 09/23/23  8:14 AM   Specimen: BLOOD  Result Value Ref Range Status   Specimen Description BLOOD BLOOD RIGHT ARM  Final   Special Requests   Final    BOTTLES DRAWN AEROBIC AND ANAEROBIC Blood Culture results may not be optimal due to an inadequate volume of blood received in culture bottles   Culture   Final    NO GROWTH 3 DAYS Performed at [facility], [address] [place], [place] [address]    Report Status PENDING  Incomplete  Blood culture (routine x 2)     Status: Abnormal   Collection Time: 09/23/23  8:14 AM   Specimen: BLOOD  Result Value Ref Range Status   Specimen Description   Final    BLOOD RIGHT ANTECUBITAL Performed at [facility], [address] [place], [place] [address]    Special Requests   Final    BOTTLES DRAWN AEROBIC AND ANAEROBIC Blood Culture results may not be optimal  due to an inadequate volume of blood received in culture bottles Performed at [facility], [address] [place], [place] [address]    Culture  Setup Time   Final    GRAM POSITIVE COCCI IN BOTH AEROBIC AND ANAEROBIC BOTTLES CRITICAL RESULT CALLED TO, READ BACK BY AND VERIFIED WITH:  Princella Ion AT 8295 09/24/2023 JG Performed at [facility], [address] [place], [place] [address]    Culture (A)  Final    STAPHYLOCOCCUS HOMINIS STAPHYLOCOCCUS EPIDERMIDIS THE SIGNIFICANCE OF ISOLATING THIS ORGANISM FROM A SINGLE SET OF BLOOD CULTURES WHEN MULTIPLE SETS ARE DRAWN IS UNCERTAIN. PLEASE NOTIFY THE MICROBIOLOGY DEPARTMENT WITHIN ONE WEEK IF SPECIATION AND SENSITIVITIES ARE REQUIRED. Performed at [facility], [address], [place], [place] [address]     Report Status 09/26/2023 FINAL  Final  Blood Culture ID Panel (Reflexed)     Status: Abnormal   Collection Time: 09/23/23  8:14 AM  Result Value Ref Range Status   Enterococcus faecalis NOT DETECTED NOT DETECTED Final   Enterococcus Faecium NOT DETECTED NOT DETECTED Final   Listeria monocytogenes NOT DETECTED NOT DETECTED Final   Staphylococcus species DETECTED (A) NOT DETECTED Final    Comment: CRITICAL RESULT CALLED TO, READ BACK BY AND VERIFIED WITH:  JASON ROBBINS AT 0052 09/24/2023 JG    Staphylococcus aureus (BCID) NOT DETECTED NOT DETECTED Final   Staphylococcus epidermidis NOT DETECTED NOT DETECTED Final   Staphylococcus lugdunensis NOT DETECTED NOT DETECTED Final   Streptococcus species NOT DETECTED NOT DETECTED Final   Streptococcus agalactiae NOT DETECTED NOT DETECTED Final   Streptococcus pneumoniae NOT DETECTED NOT DETECTED Final   Streptococcus pyogenes NOT DETECTED NOT DETECTED Final   A.calcoaceticus-baumannii NOT DETECTED NOT DETECTED Final   Bacteroides fragilis NOT DETECTED NOT DETECTED Final   Enterobacterales NOT DETECTED NOT DETECTED Final   Enterobacter cloacae complex NOT DETECTED NOT DETECTED Final   Escherichia coli NOT DETECTED NOT DETECTED Final   Klebsiella aerogenes NOT DETECTED NOT DETECTED Final   Klebsiella oxytoca NOT DETECTED NOT DETECTED Final   Klebsiella pneumoniae NOT DETECTED NOT DETECTED Final   Proteus species NOT DETECTED NOT DETECTED Final   Salmonella species NOT DETECTED NOT DETECTED Final   Serratia marcescens NOT DETECTED NOT DETECTED Final   Haemophilus influenzae NOT DETECTED NOT DETECTED Final   Neisseria meningitidis NOT DETECTED NOT DETECTED Final   Pseudomonas aeruginosa NOT DETECTED NOT DETECTED Final   Stenotrophomonas maltophilia NOT DETECTED NOT DETECTED Final   Candida albicans NOT DETECTED NOT DETECTED Final   Candida auris NOT DETECTED NOT DETECTED Final   Candida glabrata NOT DETECTED NOT DETECTED Final   Candida krusei  NOT DETECTED NOT DETECTED Final   Candida parapsilosis NOT DETECTED NOT DETECTED Final   Candida tropicalis NOT DETECTED NOT DETECTED Final   Cryptococcus neoformans/gattii NOT DETECTED NOT DETECTED Final    Comment: Performed at [facility], [address] [place], [place] [address]  MRSA Next Gen by PCR, Nasal     Status: None   Collection Time: 09/23/23  3:53 PM   Specimen: Nasal Mucosa; Nasal Swab  Result Value Ref Range Status   MRSA by PCR Next Gen NOT DETECTED NOT DETECTED Final    Comment: (NOTE) The GeneXpert MRSA Assay (FDA approved for NASAL specimens only), is one component of a comprehensive MRSA colonization surveillance program. It is not intended to diagnose MRSA infection nor to guide or monitor treatment for MRSA infections. Test performance is not FDA  approved in patients less than 40 years old. Performed at [facility], [address] [place], [place] [address]   Expectorated Sputum Assessment w Gram Stain, Rflx to Resp Cult     Status: None   Collection Time: 09/23/23  4:04 PM   Specimen: Sputum  Result Value Ref Range Status   Specimen Description SPUTUM  Final   Special Requests NONE  Final   Sputum evaluation   Final    THIS SPECIMEN IS ACCEPTABLE FOR SPUTUM CULTURE Performed at [facility], [address] [place], [place] [address]    Report Status 09/23/2023 FINAL  Final  Culture, Respiratory w Gram Stain     Status: None (Preliminary result)   Collection Time: 09/23/23  4:04 PM   Specimen: SPU  Result Value Ref Range Status   Specimen Description   Final    SPUTUM Performed at [facility], [address], [place], [place] [address]    Special Requests   Final    NONE Reflexed from [phone] Performed at [facility], [address] [place], [place] [address]    Gram Stain   Final    ABUNDANT SQUAMOUS EPITHELIAL CELLS PRESENT ABUNDANT WBC PRESENT, PREDOMINANTLY PMN RARE YEAST FEW GRAM  POSITIVE COCCI RARE GRAM NEGATIVE RODS    Culture   Final    CULTURE REINCUBATED FOR BETTER GROWTH Performed at [facility], [address], [place], [place] [address]    Report Status PENDING  Incomplete    Labs: CBC: Recent Labs  Lab 09/23/23 0814 09/24/23 0635 09/26/23 0614  WBC 16.6* 13.2* 11.2*  NEUTROABS 14.6*  --  7.4  HGB 12.2 10.1* 10.2*  HCT 33.6* 27.8* 29.6*  MCV 83.2 83.0 86.0  PLT 246 280 507*   Basic Metabolic Panel: Recent Labs  Lab 09/23/23 0814 09/23/23 1553 09/24/23 0012 09/24/23 0635 09/26/23 0614  NA 120* 124* 129* 131* 136  K 2.9* 3.3*  --  2.8* 3.8  CL 86* 92*  --  98 100  CO2 20* 21*  --  23 26  GLUCOSE 152* 126*  --  120* 143*  BUN 15 11  --  9 12  CREATININE 1.01* 0.71  --  0.67 0.66  CALCIUM 8.0* 7.3*  --  7.9* 8.2*  MG 1.7  --   --   --   --    Liver Function Tests: Recent Labs  Lab 09/23/23 0814  AST 33  ALT 35  ALKPHOS 81  BILITOT 2.5*  PROT 7.4  ALBUMIN 3.0*   CBG: No results for input(s): "GLUCAP" in the last 168 hours.  Discharge time spent: greater than 30 minutes.  Signed: Jonah Blue, MD [facility] 09/26/2023

## 2023-09-26 NOTE — Plan of Care (Signed)
  Problem: Education: Goal: Knowledge of General Education information will improve Description: Including pain rating scale, medication(s)/side effects and non-pharmacologic comfort measures Outcome: Progressing   Problem: Health Behavior/Discharge Planning: Goal: Ability to manage health-related needs will improve Outcome: Progressing   Problem: Activity: Goal: Risk for activity intolerance will decrease Outcome: Progressing   Problem: Nutrition: Goal: Adequate nutrition will be maintained Outcome: Progressing   Problem: Coping: Goal: Level of anxiety will decrease Outcome: Progressing   Problem: Pain Managment: Goal: General experience of comfort will improve and/or be controlled Outcome: Progressing   Problem: Safety: Goal: Ability to remain free from injury will improve Outcome: Progressing

## 2023-09-27 ENCOUNTER — Telehealth: Payer: Self-pay

## 2023-09-27 NOTE — Transitions of Care (Post Inpatient/ED Visit) (Signed)
   09/27/2023  Name: Morgan Stevens MRN: [id number] DOB: Feb 20, 1966  Today's TOC FU Call Status: Today's TOC FU Call Status:: Unsuccessful Call (1st Attempt) Unsuccessful Call (1st Attempt) Date: 09/27/23  Attempted to reach the patient regarding the most recent Inpatient/ED visit.  Follow Up Plan: Additional outreach attempts will be made to reach the patient to complete the Transitions of Care (Post Inpatient/ED visit) call.  Deidre Ala, BSN, RN [place]  [facility] - [employer] [facility] RN Care Manager [phone]

## 2023-09-28 LAB — CULTURE, BLOOD (ROUTINE X 2): Culture: NO GROWTH

## 2023-09-30 ENCOUNTER — Telehealth: Payer: Self-pay | Admitting: *Deleted

## 2023-09-30 NOTE — Transitions of Care (Post Inpatient/ED Visit) (Signed)
   09/30/2023  Name: Roshanna Cimino MRN: [id number] DOB: 05/05/1966  Today's TOC FU Call Status: Today's TOC FU Call Status:: Unsuccessful Call (2nd Attempt) Unsuccessful Call (2nd Attempt) Date: 09/30/23  Attempted to reach the patient regarding the most recent Inpatient/ED visit.  Follow Up Plan: Additional outreach attempts will be made to reach the patient to complete the Transitions of Care (Post Inpatient/ED visit) call.   Irving Shows [facility], BSN RN Care Manager/ Transition of Care [place]/ [facility] [phone]

## 2023-10-01 ENCOUNTER — Telehealth: Payer: Self-pay | Admitting: *Deleted

## 2023-10-01 NOTE — Transitions of Care (Post Inpatient/ED Visit) (Signed)
   10/01/2023  Name: Morgan Stevens MRN: [id number] DOB: 1966-07-18  Today's TOC FU Call Status: Today's TOC FU Call Status:: Unsuccessful Call (3rd Attempt) Unsuccessful Call (3rd Attempt) Date: 10/01/23  Attempted to reach the patient regarding the most recent Inpatient/ED visit.  Follow Up Plan: No further outreach attempts will be made at this time. We have been unable to contact the patient.  Mliss Creed [facility], BSN RN Care Manager/ Transition of Care [place]/ [facility] [phone]

## 2023-10-02 ENCOUNTER — Encounter: Payer: Self-pay | Admitting: Family Medicine

## 2023-10-02 ENCOUNTER — Ambulatory Visit: Payer: Medicaid Other | Admitting: Family Medicine

## 2023-10-02 VITALS — BP 122/80 | HR 83 | Ht 64.0 in | Wt 241.0 lb

## 2023-10-02 DIAGNOSIS — J189 Pneumonia, unspecified organism: Secondary | ICD-10-CM | POA: Diagnosis not present

## 2023-10-02 NOTE — Patient Instructions (Addendum)
 Thank you for coming to the office today.  Keep in touch, let me know if not improving by Friday we can extend course if need.  Repeat Chest X-ray here 3-4 weeks from the original treatment. At this point, 2-3 weeks from today. End of February. Walk in Mon-Thurs, 8-4pm if you are feeling better, okay to do this x-ray, if NOT feeling as much improvement or new concerns, let me know first, and we can order the CT Scan at the hospital / imaging center instead.   Please schedule a Follow-up Appointment to: Return if symptoms worsen or fail to improve.  If you have any other questions or concerns, please feel free to call the office or send a message through MyChart. You may also schedule an earlier appointment if necessary.  Additionally, you may be receiving a survey about your experience at our office within a few days to 1 week by e-mail or mail. We value your feedback.  Marsa Officer, DO [facility], [place]

## 2023-10-02 NOTE — Progress Notes (Signed)
 Subjective:    Patient ID: Morgan Stevens, female    DOB: 12-13-1965, 58 y.o.   MRN: [id number]  Morgan Stevens is a 58 y.o. female presenting on 10/02/2023 for Pneumonia   HPI  Discussed the use of AI scribe software for clinical note transcription with the patient, who gave verbal consent to proceed.  History of Present Illness    Morgan Stevens is a 58 year old female who presents for a follow-up after hospitalization for pneumonia secondary to influenza A.       HOSPITAL FOLLOW-UP VISIT  ED visit [facility] 09/20/23 for URI diagnosed Influenza A, negative covid. Had negative X-ray, no medication rx. Supportive care.  Hospital/Location: [facility] Date of Admission: 09/23/23 Date of Discharge: 09/26/23 Transitions of care telephone call: TOC call unsuccessful x 3 attempts, last 10/01/23, Mliss Creed RN  Reason for Admission: Influenza, Pneumonia  - Hospital H&P and Discharge Summary have been reviewed - Patient presents today 6 days after recent hospitalization.   Initially, she visited the ER on September 20, 2023, with flu symptoms, testing positive for Influenza A and negative for COVID-19. She was discharged with supportive care but returned to the ER on September 23, 2023, due to worsening symptoms, leading to a pneumonia diagnosis and hospital admission until September 26, 2023. A CT scan during her stay revealed multiple areas of pneumonia, particularly in the left lower lobe, and a left-sided pleural effusion. She was treated with IV steroids, followed by a five-day course of oral steroids, and antibiotics including azithromycin  and Augmentin , which she completed yesterday.  Currently, she feels 'better, kind of,' but still experiences an annoying cough and pleuritic chest pain, especially with deep breaths or lying down. The pain is localized to the left flank on the ribcage. No current fevers are present. She has been using over-the-counter Robitussin for cough suppression, which she finds  stressful to obtain. - Pleuritic pain  She uses an incentive spirometer to practice lung exercises, aiming to improve lung volumes and aid recovery.   - New medications on discharge: Augmentin , Azithromycin  - completed - Changes to current meds on discharge: none  I have reviewed the discharge medication list, and have reconciled the current and discharge medications today.   Current Outpatient Medications:    amLODipine  (NORVASC ) 5 MG tablet, Take 1 tablet (5 mg total) by mouth daily., Disp: 90 tablet, Rfl: 1   aspirin  EC 81 MG tablet, Take 81 mg by mouth at bedtime. Swallow whole., Disp: , Rfl:    atorvastatin  (LIPITOR) 20 MG tablet, TAKE 1 TABLET BY MOUTH AT BEDTIME, Disp: 90 tablet, Rfl: 0   Calcium  Carbonate-Simethicone  (TUMS GAS RELIEF CHEWY BITES PO), Take by mouth as needed., Disp: , Rfl:    EUTHYROX  125 MCG tablet, TAKE 1 TABLET BY MOUTH ONCE DAILY BEFORE BREAKFAST, Disp: 90 tablet, Rfl: 1   guaiFENesin  (ROBITUSSIN) 100 MG/5ML liquid, Take 5 mLs by mouth every 6 (six) hours., Disp: , Rfl:    montelukast  (SINGULAIR ) 10 MG tablet, Take 1 tablet (10 mg total) by mouth at bedtime., Disp: 90 tablet, Rfl: 3   VENTOLIN  HFA 108 (90 Base) MCG/ACT inhaler, Inhale 2 puffs into the lungs every 4 (four) hours as needed for wheezing or shortness of breath., Disp: 18 g, Rfl: 0   esomeprazole  (NEXIUM ) 40 MG capsule, Take 1 capsule (40 mg total) by mouth 2 (two) times daily before a meal., Disp: 60 capsule, Rfl: 5   famotidine  (PEPCID ) 20 MG tablet, Take 1 tablet (20 mg total)  by mouth 2 (two) times daily., Disp: 60 tablet, Rfl: 5  ------------------------------------------------------------------------- Social History   Tobacco Use   Smoking status: Never   Smokeless tobacco: Never  Vaping Use   Vaping status: Never Used  Substance Use Topics   Alcohol use: No    Alcohol/week: 0.0 standard drinks of alcohol   Drug use: No    Review of Systems Per HPI unless specifically indicated  above     Objective:    BP 122/80   Pulse 83   Ht 5' 4 (1.626 m)   Wt 241 lb (109.3 kg)   LMP 07/11/2020   SpO2 96%   BMI 41.37 kg/m   Wt Readings from Last 3 Encounters:  10/02/23 241 lb (109.3 kg)  09/23/23 238 lb (108 kg)  09/20/23 238 lb (108 kg)    Physical Exam Vitals and nursing note reviewed.  Constitutional:      General: She is not in acute distress.    Appearance: She is well-developed. She is obese. She is not diaphoretic.     Comments: Currently mild discomfort with deep breathing, tired appearing, cooperative  HENT:     Head: Normocephalic and atraumatic.  Eyes:     General:        Right eye: No discharge.        Left eye: No discharge.     Conjunctiva/sclera: Conjunctivae normal.  Neck:     Thyroid : No thyromegaly.  Cardiovascular:     Rate and Rhythm: Normal rate and regular rhythm.     Heart sounds: Normal heart sounds. No murmur heard. Pulmonary:     Effort: Pulmonary effort is normal. No respiratory distress.     Breath sounds: No wheezing or rales.     Comments: Reduced air movement due to pleuritic pain, no focal auscultation abnormality but some slight reduced sounds on Left side but overall still good air movement. Musculoskeletal:        General: Normal range of motion.     Cervical back: Normal range of motion and neck supple.  Lymphadenopathy:     Cervical: No cervical adenopathy.  Skin:    General: Skin is warm and dry.     Findings: No erythema or rash.  Neurological:     Mental Status: She is alert and oriented to person, place, and time.  Psychiatric:        Behavior: Behavior normal.     Comments: Well groomed, good eye contact, normal speech and thoughts     CLINICAL DATA:  Pleuritic left chest pain starting yesterday. Dehydration.   EXAM: CT CHEST WITHOUT CONTRAST   TECHNIQUE: Multidetector CT imaging of the chest was performed following the standard protocol without IV contrast.   RADIATION DOSE REDUCTION: This exam  was performed according to the departmental dose-optimization program which includes automated exposure control, adjustment of the mA and/or kV according to patient size and/or use of iterative reconstruction technique.   COMPARISON:  CTA chest 04/10/2019   FINDINGS: Cardiovascular: Mild atheromatous vascular calcification of the aortic arch. Upper normal heart size.   Mediastinum/Nodes: Scattered small mediastinal lymph nodes are likely reactive.   Lungs/Pleura: Small left pleural effusion.   Consolidation involving most of the left lower lobe with some scattered minimal sparing sparing. Smaller regions of consolidation in the left upper lobe and right lower lobe. 6 by 6 by 5 mm right middle lobe nodule on image 75 series 4. Tree-in-bud reticulonodular opacities in the left upper lobe.   Upper Abdomen:  Cholecystectomy.   Musculoskeletal: Lower cervical plate and screw fixator.   IMPRESSION: 1. Consolidation involving most of the left lower lobe with some scattered minimal sparing. Smaller regions of consolidation in the left upper lobe and right lower lobe. Tree-in-bud reticulonodular opacities in the left upper lobe. Findings are most compatible with multifocal pneumonia. Given the degree of consolidation and potential for obscuration of other underlying findings, surveillance imaging by chest radiograph or chest CT is recommended in 3-4 weeks time after therapy for pneumonia. 2. Small left pleural effusion. 3. 6 by 6 by 5 mm (volume = 90 mm^3) right middle lobe nodule. This is most likely infectious/inflammatory. No follow-up needed for this particular finding if patient is low-risk.This recommendation follows the consensus statement: Guidelines for Management of Incidental Pulmonary Nodules Detected on CT Images: From the Fleischner Society 2017; Radiology 2017; 284:228-243. 4. Mild atheromatous vascular calcification of the aortic arch. 5. Lower cervical plate and  screw fixator.   Aortic Atherosclerosis (ICD10-I70.0).     Electronically Signed   By: Ryan Salvage M.D.   On: 09/23/2023 11:42  Results for orders placed or performed during the hospital encounter of 09/23/23  Blood culture (routine x 2)   Collection Time: 09/23/23  8:14 AM   Specimen: BLOOD  Result Value Ref Range   Specimen Description BLOOD BLOOD RIGHT ARM    Special Requests      BOTTLES DRAWN AEROBIC AND ANAEROBIC Blood Culture results may not be optimal due to an inadequate volume of blood received in culture bottles   Culture      NO GROWTH 5 DAYS Performed at [facility], [address] [place], [place] [address]    Report Status 09/28/2023 FINAL   Blood culture (routine x 2)   Collection Time: 09/23/23  8:14 AM   Specimen: BLOOD  Result Value Ref Range   Specimen Description      BLOOD RIGHT ANTECUBITAL Performed at [facility], [address] [place], [place] [address]    Special Requests      BOTTLES DRAWN AEROBIC AND ANAEROBIC Blood Culture results may not be optimal due to an inadequate volume of blood received in culture bottles Performed at [facility], [address] [place], [place] [address]    Culture  Setup Time      GRAM POSITIVE COCCI IN BOTH AEROBIC AND ANAEROBIC BOTTLES CRITICAL RESULT CALLED TO, READ BACK BY AND VERIFIED WITHBETHA SELINDA SIMPERS AT 0052 09/24/2023 JG Performed at [facility], [address] [place], [place] [address]    Culture (A)     STAPHYLOCOCCUS HOMINIS STAPHYLOCOCCUS EPIDERMIDIS THE SIGNIFICANCE OF ISOLATING THIS ORGANISM FROM A SINGLE SET OF BLOOD CULTURES WHEN MULTIPLE SETS ARE DRAWN IS UNCERTAIN. PLEASE NOTIFY THE MICROBIOLOGY DEPARTMENT WITHIN ONE WEEK IF SPECIATION AND SENSITIVITIES ARE REQUIRED. Performed at [facility], [address] [place], [place] [address]    Report Status 09/26/2023 FINAL   Blood Culture ID Panel (Reflexed)   Collection Time: 09/23/23   8:14 AM  Result Value Ref Range   Enterococcus faecalis NOT DETECTED NOT DETECTED   Enterococcus Faecium NOT DETECTED NOT DETECTED   Listeria monocytogenes NOT DETECTED NOT DETECTED   Staphylococcus species DETECTED (A) NOT DETECTED   Staphylococcus aureus (BCID) NOT DETECTED NOT DETECTED   Staphylococcus epidermidis NOT DETECTED NOT DETECTED   Staphylococcus lugdunensis NOT DETECTED NOT DETECTED   Streptococcus species NOT DETECTED NOT DETECTED   Streptococcus agalactiae NOT DETECTED NOT DETECTED  Streptococcus pneumoniae NOT DETECTED NOT DETECTED   Streptococcus pyogenes NOT DETECTED NOT DETECTED   A.calcoaceticus-baumannii NOT DETECTED NOT DETECTED   Bacteroides fragilis NOT DETECTED NOT DETECTED   Enterobacterales NOT DETECTED NOT DETECTED   Enterobacter cloacae complex NOT DETECTED NOT DETECTED   Escherichia coli NOT DETECTED NOT DETECTED   Klebsiella aerogenes NOT DETECTED NOT DETECTED   Klebsiella oxytoca NOT DETECTED NOT DETECTED   Klebsiella pneumoniae NOT DETECTED NOT DETECTED   Proteus species NOT DETECTED NOT DETECTED   Salmonella species NOT DETECTED NOT DETECTED   Serratia marcescens NOT DETECTED NOT DETECTED   Haemophilus influenzae NOT DETECTED NOT DETECTED   Neisseria meningitidis NOT DETECTED NOT DETECTED   Pseudomonas aeruginosa NOT DETECTED NOT DETECTED   Stenotrophomonas maltophilia NOT DETECTED NOT DETECTED   Candida albicans NOT DETECTED NOT DETECTED   Candida auris NOT DETECTED NOT DETECTED   Candida glabrata NOT DETECTED NOT DETECTED   Candida krusei NOT DETECTED NOT DETECTED   Candida parapsilosis NOT DETECTED NOT DETECTED   Candida tropicalis NOT DETECTED NOT DETECTED   Cryptococcus neoformans/gattii NOT DETECTED NOT DETECTED  CBC with Differential   Collection Time: 09/23/23  8:14 AM  Result Value Ref Range   WBC 16.6 (H) 4.0 - 10.5 K/uL   RBC 4.04 3.87 - 5.11 MIL/uL   Hemoglobin 12.2 12.0 - 15.0 g/dL   HCT 66.3 (L) 63.9 - 53.9 %   MCV 83.2 80.0  - 100.0 fL   MCH 30.2 26.0 - 34.0 pg   MCHC 36.3 (H) 30.0 - 36.0 g/dL   RDW 87.3 88.4 - 84.4 %   Platelets 246 150 - 400 K/uL   nRBC 0.0 0.0 - 0.2 %   Neutrophils Relative % 88 %   Neutro Abs 14.6 (H) 1.7 - 7.7 K/uL   Lymphocytes Relative 6 %   Lymphs Abs 1.0 0.7 - 4.0 K/uL   Monocytes Relative 4 %   Monocytes Absolute 0.7 0.1 - 1.0 K/uL   Eosinophils Relative 0 %   Eosinophils Absolute 0.1 0.0 - 0.5 K/uL   Basophils Relative 1 %   Basophils Absolute 0.1 0.0 - 0.1 K/uL   WBC Morphology DOHLE BODIES    RBC Morphology MORPHOLOGY UNREMARKABLE    Smear Review MORPHOLOGY UNREMARKABLE    Immature Granulocytes 1 %   Abs Immature Granulocytes 0.12 (H) 0.00 - 0.07 K/uL  Comprehensive metabolic panel   Collection Time: 09/23/23  8:14 AM  Result Value Ref Range   Sodium 120 (L) 135 - 145 mmol/L   Potassium 2.9 (L) 3.5 - 5.1 mmol/L   Chloride 86 (L) 98 - 111 mmol/L   CO2 20 (L) 22 - 32 mmol/L   Glucose, Bld 152 (H) 70 - 99 mg/dL   BUN 15 6 - 20 mg/dL   Creatinine, Ser 8.98 (H) 0.44 - 1.00 mg/dL   Calcium  8.0 (L) 8.9 - 10.3 mg/dL   Total Protein 7.4 6.5 - 8.1 g/dL   Albumin 3.0 (L) 3.5 - 5.0 g/dL   AST 33 15 - 41 U/L   ALT 35 0 - 44 U/L   Alkaline Phosphatase 81 38 - 126 U/L   Total Bilirubin 2.5 (H) 0.0 - 1.2 mg/dL   GFR, Estimated >39 >39 mL/min   Anion gap 14 5 - 15  Lactic acid, plasma   Collection Time: 09/23/23  8:14 AM  Result Value Ref Range   Lactic Acid, Venous 2.7 (HH) 0.5 - 1.9 mmol/L  Magnesium    Collection Time: 09/23/23  8:14  AM  Result Value Ref Range   Magnesium  1.7 1.7 - 2.4 mg/dL  Bilirubin, direct   Collection Time: 09/23/23  8:14 AM  Result Value Ref Range   Bilirubin, Direct 1.1 (H) 0.0 - 0.2 mg/dL  Troponin I (High Sensitivity)   Collection Time: 09/23/23  8:14 AM  Result Value Ref Range   Troponin I (High Sensitivity) 15 <18 ng/L  Osmolality   Collection Time: 09/23/23  9:56 AM  Result Value Ref Range   Osmolality 262 (L) 275 - 295 mOsm/kg   Lactic acid, plasma   Collection Time: 09/23/23  9:58 AM  Result Value Ref Range   Lactic Acid, Venous 1.9 0.5 - 1.9 mmol/L  TSH   Collection Time: 09/23/23  9:58 AM  Result Value Ref Range   TSH 2.049 0.350 - 4.500 uIU/mL  Troponin I (High Sensitivity)   Collection Time: 09/23/23  9:58 AM  Result Value Ref Range   Troponin I (High Sensitivity) 5 <18 ng/L  MRSA Next Gen by PCR, Nasal   Collection Time: 09/23/23  3:53 PM   Specimen: Nasal Mucosa; Nasal Swab  Result Value Ref Range   MRSA by PCR Next Gen NOT DETECTED NOT DETECTED  Basic metabolic panel   Collection Time: 09/23/23  3:53 PM  Result Value Ref Range   Sodium 124 (L) 135 - 145 mmol/L   Potassium 3.3 (L) 3.5 - 5.1 mmol/L   Chloride 92 (L) 98 - 111 mmol/L   CO2 21 (L) 22 - 32 mmol/L   Glucose, Bld 126 (H) 70 - 99 mg/dL   BUN 11 6 - 20 mg/dL   Creatinine, Ser 9.28 0.44 - 1.00 mg/dL   Calcium  7.3 (L) 8.9 - 10.3 mg/dL   GFR, Estimated >39 >39 mL/min   Anion gap 11 5 - 15  HIV Antibody (routine testing w rflx)   Collection Time: 09/23/23  3:53 PM  Result Value Ref Range   HIV Screen 4th Generation wRfx Non Reactive Non Reactive  Expectorated Sputum Assessment w Gram Stain, Rflx to Resp Cult   Collection Time: 09/23/23  4:04 PM   Specimen: Sputum  Result Value Ref Range   Specimen Description SPUTUM    Special Requests NONE    Sputum evaluation      THIS SPECIMEN IS ACCEPTABLE FOR SPUTUM CULTURE Performed at [facility], [address] [place], [place] [address]    Report Status 09/23/2023 FINAL   Culture, Respiratory w Gram Stain   Collection Time: 09/23/23  4:04 PM   Specimen: SPU  Result Value Ref Range   Specimen Description      SPUTUM Performed at [facility], [address], [place], [place] [address]    Special Requests      NONE Reflexed from [id number] Performed at [facility], [address] [place], [place] [address]    Gram Stain      ABUNDANT SQUAMOUS  EPITHELIAL CELLS PRESENT ABUNDANT WBC PRESENT, PREDOMINANTLY PMN RARE YEAST FEW GRAM POSITIVE COCCI RARE GRAM NEGATIVE RODS    Culture      FEW Normal respiratory flora-no Staph aureus or Pseudomonas seen Performed at [facility], [address] [place], [place] [address]    Report Status 09/26/2023 FINAL   Osmolality, urine   Collection Time: 09/23/23  5:45 PM  Result Value Ref Range   Osmolality, Ur 487 300 - 900 mOsm/kg  Sodium, urine, random   Collection Time: 09/23/23  5:45 PM  Result Value Ref  Range   Sodium, Ur 11 mmol/L  Sodium   Collection Time: 09/24/23 12:12 AM  Result Value Ref Range   Sodium 129 (L) 135 - 145 mmol/L  CBC   Collection Time: 09/24/23  6:35 AM  Result Value Ref Range   WBC 13.2 (H) 4.0 - 10.5 K/uL   RBC 3.35 (L) 3.87 - 5.11 MIL/uL   Hemoglobin 10.1 (L) 12.0 - 15.0 g/dL   HCT 72.1 (L) 63.9 - 53.9 %   MCV 83.0 80.0 - 100.0 fL   MCH 30.1 26.0 - 34.0 pg   MCHC 36.3 (H) 30.0 - 36.0 g/dL   RDW 86.7 88.4 - 84.4 %   Platelets 280 150 - 400 K/uL   nRBC 0.0 0.0 - 0.2 %  Basic metabolic panel   Collection Time: 09/24/23  6:35 AM  Result Value Ref Range   Sodium 131 (L) 135 - 145 mmol/L   Potassium 2.8 (L) 3.5 - 5.1 mmol/L   Chloride 98 98 - 111 mmol/L   CO2 23 22 - 32 mmol/L   Glucose, Bld 120 (H) 70 - 99 mg/dL   BUN 9 6 - 20 mg/dL   Creatinine, Ser 9.32 0.44 - 1.00 mg/dL   Calcium  7.9 (L) 8.9 - 10.3 mg/dL   GFR, Estimated >39 >39 mL/min   Anion gap 10 5 - 15  CBC with Differential/Platelet   Collection Time: 09/26/23  6:14 AM  Result Value Ref Range   WBC 11.2 (H) 4.0 - 10.5 K/uL   RBC 3.44 (L) 3.87 - 5.11 MIL/uL   Hemoglobin 10.2 (L) 12.0 - 15.0 g/dL   HCT 70.3 (L) 63.9 - 53.9 %   MCV 86.0 80.0 - 100.0 fL   MCH 29.7 26.0 - 34.0 pg   MCHC 34.5 30.0 - 36.0 g/dL   RDW 86.2 88.4 - 84.4 %   Platelets 507 (H) 150 - 400 K/uL   nRBC 0.0 0.0 - 0.2 %   Neutrophils Relative % 66 %   Neutro Abs 7.4 1.7 - 7.7 K/uL   Lymphocytes Relative 22  %   Lymphs Abs 2.4 0.7 - 4.0 K/uL   Monocytes Relative 8 %   Monocytes Absolute 0.8 0.1 - 1.0 K/uL   Eosinophils Relative 0 %   Eosinophils Absolute 0.0 0.0 - 0.5 K/uL   Basophils Relative 0 %   Basophils Absolute 0.0 0.0 - 0.1 K/uL   WBC Morphology Mild Left Shift (1-5% metas, occ myelo)    RBC Morphology MORPHOLOGY UNREMARKABLE    Smear Review PLATELET CLUMPS NOTED ON SMEAR    Immature Granulocytes 4 %   Abs Immature Granulocytes 0.45 (H) 0.00 - 0.07 K/uL  Basic metabolic panel   Collection Time: 09/26/23  6:14 AM  Result Value Ref Range   Sodium 136 135 - 145 mmol/L   Potassium 3.8 3.5 - 5.1 mmol/L   Chloride 100 98 - 111 mmol/L   CO2 26 22 - 32 mmol/L   Glucose, Bld 143 (H) 70 - 99 mg/dL   BUN 12 6 - 20 mg/dL   Creatinine, Ser 9.33 0.44 - 1.00 mg/dL   Calcium  8.2 (L) 8.9 - 10.3 mg/dL   GFR, Estimated >39 >39 mL/min   Anion gap 10 5 - 15      Assessment & Plan:   Problem List Items Addressed This Visit     Multifocal pneumonia - Primary   Relevant Orders   DG Chest 2 View     Multifocal Pneumonia Recent hospitalization for  pneumonia secondary to Influenza A. Patient reports persistent cough and pleuritic chest pain, particularly when lying down. CT scan showed multiple areas of pneumonia and pleural effusion on the left side.  Completed oral antibiotics remaining course Augmentin , Azithromcyin Today is 1st day off antibiotics. Remains Afebrile. Pulse ox 96%  -Continue supportive care and use of incentive spirometer for lung exercises. -If symptoms do not improve by 10/04/2023, consider extending antibiotic course. -Repeat chest x-ray in 2-3 weeks (between 10/16/2023 and 10/23/2023). If patient is less than 90% improved, consider repeat CT scan.    No orders of the defined types were placed in this encounter.   Follow up plan: Return if symptoms worsen or fail to improve.   Marsa Officer, DO [facility] 10/02/2023, 3:57 PM

## 2023-10-05 ENCOUNTER — Encounter: Payer: Self-pay | Admitting: Family Medicine

## 2023-10-05 DIAGNOSIS — J189 Pneumonia, unspecified organism: Secondary | ICD-10-CM

## 2023-10-07 MED ORDER — LEVOFLOXACIN 750 MG PO TABS
750.0000 mg | ORAL_TABLET | Freq: Every day | ORAL | 0 refills | Status: DC
Start: 2023-10-07 — End: 2023-11-27

## 2023-10-23 ENCOUNTER — Ambulatory Visit
Admission: RE | Admit: 2023-10-23 | Discharge: 2023-10-23 | Disposition: A | Discharge status: Home / Self Care | Payer: Medicaid Other | Source: Ambulatory Visit | Attending: Family Medicine | Admitting: Family Medicine

## 2023-10-23 ENCOUNTER — Ambulatory Visit
Admission: RE | Admit: 2023-10-23 | Discharge: 2023-10-23 | Disposition: A | Discharge status: Home / Self Care | Payer: Medicaid Other | Attending: Family Medicine | Admitting: Family Medicine

## 2023-10-23 DIAGNOSIS — J189 Pneumonia, unspecified organism: Secondary | ICD-10-CM

## 2023-10-24 ENCOUNTER — Encounter: Payer: Self-pay | Admitting: Family Medicine

## 2023-10-25 ENCOUNTER — Encounter: Payer: Self-pay | Admitting: Internal Medicine

## 2023-10-25 ENCOUNTER — Telehealth: Payer: Medicaid Other | Admitting: Internal Medicine

## 2023-10-25 DIAGNOSIS — B370 Candidal stomatitis: Secondary | ICD-10-CM | POA: Diagnosis not present

## 2023-10-25 MED ORDER — NYSTATIN 100000 UNIT/ML MT SUSP: 5.0000 mL | Freq: Four times a day (QID) | OROMUCOSAL | 0 refills | Status: DC

## 2023-10-25 NOTE — Progress Notes (Signed)
 Virtual Visit via Video Note  I connected with Morgan Stevens on 10/25/23 at  2:00 PM EST by a video enabled telemedicine application and verified that I am speaking with the correct person using two identifiers.  Location: Patient: Home Provider: Office  Person's participating in this video call: Nicki Reaper, NP-C and Breelle Hollywood   I discussed the limitations of evaluation and management by telemedicine and the availability of in person appointments. The patient expressed understanding and agreed to proceed.  History of Present Illness: Discussed the use of AI scribe software for clinical note transcription with the patient, who gave verbal consent to proceed.   Morgan Stevens is a 58 year old female who presents with symptoms of oral thrush after recent antibiotic use for multifocal pneumonia.  She has been experiencing symptoms of oral thrush for approximately two weeks, characterized by a burning sensation on her tongue and a white coating. Occasional sore throat is present, but there is no difficulty swallowing or symptoms of yeast infection elsewhere.  The symptoms began after completing a course of antibiotics for pneumonia. She has not attempted any over-the-counter remedies such as saltwater gargles.  There is no history of thrush with previous antibiotic or steroid use.  Her current medication includes an inhaler, which she does not use regularly, keeping it as a 'just in case' measure.        Past Medical History:  Diagnosis Date   Anemia    Chronic headaches    several per week   Gastritis    GERD (gastroesophageal reflux disease)    Hiatal hernia    Hyperlipidemia    Hypertension    Hypothyroidism    Obesity (BMI 30-39.9)    Pre-diabetes    Reflux    Spinal stenosis    Thyroid disease    Vertigo    rare   Wears contact lenses     Current Outpatient Medications  Medication Sig Dispense Refill   amLODipine (NORVASC) 5 MG tablet Take 1 tablet (5 mg total) by  mouth daily. 90 tablet 1   aspirin EC 81 MG tablet Take 81 mg by mouth at bedtime. Swallow whole.     atorvastatin (LIPITOR) 20 MG tablet TAKE 1 TABLET BY MOUTH AT BEDTIME 90 tablet 0   Calcium Carbonate-Simethicone (TUMS GAS RELIEF CHEWY BITES PO) Take by mouth as needed.     esomeprazole (NEXIUM) 40 MG capsule Take 1 capsule (40 mg total) by mouth 2 (two) times daily before a meal. 60 capsule 5   EUTHYROX 125 MCG tablet TAKE 1 TABLET BY MOUTH ONCE DAILY BEFORE BREAKFAST 90 tablet 1   famotidine (PEPCID) 20 MG tablet Take 1 tablet (20 mg total) by mouth 2 (two) times daily. 60 tablet 5   guaiFENesin (ROBITUSSIN) 100 MG/5ML liquid Take 5 mLs by mouth every 6 (six) hours.     levofloxacin (LEVAQUIN) 750 MG tablet Take 1 tablet (750 mg total) by mouth daily. 5 tablet 0   montelukast (SINGULAIR) 10 MG tablet Take 1 tablet (10 mg total) by mouth at bedtime. 90 tablet 3   VENTOLIN HFA 108 (90 Base) MCG/ACT inhaler Inhale 2 puffs into the lungs every 4 (four) hours as needed for wheezing or shortness of breath. 18 g 0   No current facility-administered medications for this visit.    No Known Allergies  Family History  Problem Relation Age of Onset   Congestive Heart Failure Mother    Breast cancer Maternal Aunt  50s   Breast cancer Maternal Aunt        55s   Breast cancer Maternal Aunt        47s    Social History   Socioeconomic History   Marital status: Married    Spouse name: Mathis Fare   Number of children: 6   Years of education: Not on file   Highest education level: Associate degree: academic program  Occupational History   Occupation: home maker  Tobacco Use   Smoking status: Never   Smokeless tobacco: Never  Vaping Use   Vaping status: Never Used  Substance and Sexual Activity   Alcohol use: No    Alcohol/week: 0.0 standard drinks of alcohol   Drug use: No   Sexual activity: Yes    Birth control/protection: None  Other Topics Concern   Not on file  Social  History Narrative   Home maker, 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, husband is a window Midwife   Social Drivers of Health   Financial Resource Strain: Medium Risk (08/27/2023)   Overall Financial Resource Strain (CARDIA)    Difficulty of Paying Living Expenses: Somewhat hard  Food Insecurity: No Food Insecurity (09/24/2023)   Hunger Vital Sign    Worried About Running Out of Food in the Last Year: Never true    Ran Out of Food in the Last Year: Never true  Recent Concern: Food Insecurity - Food Insecurity Present (08/27/2023)   Hunger Vital Sign    Worried About Running Out of Food in the Last Year: Often true    Ran Out of Food in the Last Year: Sometimes true  Transportation Needs: No Transportation Needs (09/24/2023)   PRAPARE - Administrator, Civil Service (Medical): No    Lack of Transportation (Non-Medical): No  Physical Activity: Unknown (08/27/2023)   Exercise Vital Sign    Days of Exercise per Week: 0 days    Minutes of Exercise per Session: Not on file  Stress: Stress Concern Present (08/27/2023)   [employer] of Occupational Health - Occupational Stress Questionnaire    Feeling of Stress : Very much  Social Connections: Socially Integrated (09/24/2023)   Social Connection and Isolation Panel [NHANES]    Frequency of Communication with Friends and Family: Three times a week    Frequency of Social Gatherings with Friends and Family: Twice a week    Attends Religious Services: More than 4 times per year    Active Member of [employer] or Organizations: Yes    Attends Engineer, structural: More than 4 times per year    Marital Status: Married  Catering manager Violence: Not At Risk (09/24/2023)   Humiliation, Afraid, Rape, and Kick questionnaire    Fear of Current or Ex-Partner: No    Emotionally Abused: No    Physically Abused: No    Sexually Abused: No     Constitutional: Denies fever, malaise, fatigue, headache or abrupt weight changes.  HEENT: Pt reports  burning sensation of tongue, white coating on tongue, sore throat. Denies eye pain, eye redness, ear pain, ringing in the ears, wax buildup, runny nose, nasal congestion, bloody nose. Respiratory: Denies difficulty breathing, shortness of breath, cough or sputum production.   Cardiovascular: Denies chest pain, chest tightness, palpitations or swelling in the hands or feet.  GU: Denies urgency, frequency, pain with urination, burning sensation, blood in urine, odor or discharge. Skin: Denies redness, rashes, lesions or ulcercations.   No other specific complaints in a complete review of systems (  except as listed in HPI above).  Observations/Objective:  LMP 07/11/2020  Wt Readings from Last 3 Encounters:  10/02/23 241 lb (109.3 kg)  09/23/23 238 lb (108 kg)  09/20/23 238 lb (108 kg)    General: Appears her stated age, obese, in NAD. HEENT: Head: normal shape and size; Throat/Mouth: Teeth present, mucosa pink and moist, white tongue coating noted.  Pulmonary/Chest: Normal effort. No respiratory distress.  Neurological: Alert and oriented.   BMET    Component Value Date/Time   NA 136 09/26/2023 0614   NA 139 08/02/2014 1624   K 3.8 09/26/2023 0614   K 3.9 08/02/2014 1624   CL 100 09/26/2023 0614   CL 103 08/02/2014 1624   CO2 26 09/26/2023 0614   CO2 27 08/02/2014 1624   GLUCOSE 143 (H) 09/26/2023 0614   GLUCOSE 113 (H) 08/02/2014 1624   BUN 12 09/26/2023 0614   BUN 10 08/02/2014 1624   CREATININE 0.66 09/26/2023 0614   CREATININE 0.83 12/10/2022 0934   CALCIUM 8.2 (L) 09/26/2023 0614   CALCIUM 8.6 08/02/2014 1624   GFRNONAA >60 09/26/2023 0614   GFRNONAA >60 08/02/2014 1624   GFRNONAA >60 05/06/2014 2154   GFRAA >60 11/22/2019 0915   GFRAA >60 08/02/2014 1624   GFRAA >60 05/06/2014 2154    Lipid Panel     Component Value Date/Time   CHOL 123 12/10/2022 0934   CHOL 165 05/08/2013 0013   TRIG 94 12/10/2022 0934   TRIG 154 05/08/2013 0013   HDL 44 (L) 12/10/2022 0934    HDL 40 05/08/2013 0013   CHOLHDL 2.8 12/10/2022 0934   VLDL 23 11/21/2020 0427   VLDL 31 05/08/2013 0013   LDLCALC 61 12/10/2022 0934   LDLCALC 94 05/08/2013 0013    CBC    Component Value Date/Time   WBC 11.2 (H) 09/26/2023 0614   RBC 3.44 (L) 09/26/2023 0614   HGB 10.2 (L) 09/26/2023 0614   HGB 11.8 (L) 08/02/2014 1624   HCT 29.6 (L) 09/26/2023 0614   HCT 35.6 08/02/2014 1624   PLT 507 (H) 09/26/2023 0614   PLT 258 08/02/2014 1624   MCV 86.0 09/26/2023 0614   MCV 90 08/02/2014 1624   MCH 29.7 09/26/2023 0614   MCHC 34.5 09/26/2023 0614   RDW 13.7 09/26/2023 0614   RDW 13.5 08/02/2014 1624   LYMPHSABS 2.4 09/26/2023 0614   LYMPHSABS 2.9 09/30/2013 2005   MONOABS 0.8 09/26/2023 0614   MONOABS 0.6 09/30/2013 2005   EOSABS 0.0 09/26/2023 0614   EOSABS 0.2 09/30/2013 2005   BASOSABS 0.0 09/26/2023 0614   BASOSABS 0.1 09/30/2013 2005    Hgb A1C Lab Results  Component Value Date   HGBA1C 5.7 (A) 08/27/2023       Assessment and Plan:  Assessment and Plan    Oral Thrush Noted white coating on tongue and burning sensation for two weeks following antibiotic treatment for pneumonia. No prior history of thrush. No other symptoms of yeast infection. -Prescribe Nystatin oral suspension, 5 mL swish and spit four times a day. -If no improvement after 5-7 days, patient to call back.       Followup with your PCP as previously requested  Follow Up Instructions:    I discussed the assessment and treatment plan with the patient. The patient was provided an opportunity to ask questions and all were answered. The patient agreed with the plan and demonstrated an understanding of the instructions.   The patient was advised to call back or seek  an in-person evaluation if the symptoms worsen or if the condition fails to improve as anticipated.   Nicki Reaper, NP

## 2023-10-25 NOTE — Patient Instructions (Signed)
Oral Thrush, Adult Oral thrush is an infection in your mouth and throat and on your tongue. It causes white patches to form in your mouth and on your tongue. Many cases of thrush are mild. But, sometimes, thrush can be serious. People who have a weak body defense system (immune system) or other diseases can be affected more. What are the causes? This condition is caused by a type of fungus called yeast. The fungus is normally present in small amounts in the mouth and nose. If a person has a long-term illness or a weak body defense system, the fungus can grow and spread quickly. This causes thrush. What increases the risk? You are more likely to develop this condition if: You have a weak body defense system. You are an older adult. You have diabetes, cancer, or HIV. You have a dry mouth. You are pregnant or breastfeeding. You do not take good care of your teeth. This risk is greater for people who have false teeth (dentures). You use antibiotic or steroid medicines. What are the signs or symptoms? Symptoms of this condition include: A burning feeling in the mouth and throat. White patches that stick to the mouth and tongue. A bad taste in the mouth or trouble tasting foods. A feeling like you have cotton in your mouth. Pain when you eat and swallow. Not wanting to eat as much as usual. Cracking at the corners of the mouth. How is this treated? This condition is treated with medicines called antifungals. These medicines prevent a fungus from growing. The medicines are either put right on the area (topical) or swallowed (oral). Your doctor will also treat other problems that you may have, such as diabetes or HIV. Follow these instructions at home: Helping with pain and soreness To lessen your pain: Drink cold liquids, like water and iced tea. Eat frozen ice pops or frozen juices. Eat foods that are easy to swallow, like gelatin and ice cream. Drink from a straw if you have too much pain  in your mouth.  General instructions Take or use over-the-counter and prescription medicines only as told by your doctor. Eat plain yogurt that has live cultures in it. Read the label to make sure that there are live cultures in your yogurt. If you wear false teeth: Take them out before you go to bed. Brush them well. Soak them in a cleaner. Rinse your mouth with warm salt-water many times a day. To make the salt-water mixture, dissolve -1 teaspoon (3-6 g) of salt in 1 cup (237 mL) of warm water. Contact a doctor if: Your problems do not get better within 7 days of treatment. Your infection is spreading. This may show as white areas on the skin outside of your mouth. You are nursing your baby and you have redness and pain in the nipples. Summary Oral thrush is an infection in your mouth and throat. It is caused by a fungus. You are more likely to get this condition if you have a weak body defense system. Diseases like diabetes, cancer, or HIV also add to your risk. This condition is treated with medicines called antifungals. Contact a doctor if you do not get better within 7 days of starting treatment. This information is not intended to replace advice given to you by your health care provider. Make sure you discuss any questions you have with your health care provider. Document Revised: 07/30/2022 Document Reviewed: 07/30/2022 Elsevier Patient Education  2024 [employer].

## 2023-10-30 ENCOUNTER — Encounter: Payer: Self-pay | Admitting: Family Medicine

## 2023-11-08 ENCOUNTER — Encounter: Payer: Self-pay | Admitting: Family Medicine

## 2023-11-08 DIAGNOSIS — I10 Essential (primary) hypertension: Secondary | ICD-10-CM

## 2023-11-09 ENCOUNTER — Other Ambulatory Visit: Payer: Self-pay | Admitting: Family Medicine

## 2023-11-09 DIAGNOSIS — E7800 Pure hypercholesterolemia, unspecified: Secondary | ICD-10-CM

## 2023-11-12 NOTE — Telephone Encounter (Signed)
 Requested Prescriptions  Pending Prescriptions Disp Refills   atorvastatin (LIPITOR) 20 MG tablet [Pharmacy Med Name: Atorvastatin Calcium 20 MG Oral Tablet] 90 tablet 0    Sig: TAKE 1 TABLET BY MOUTH AT BEDTIME     Cardiovascular:  Antilipid - Statins Failed - 11/12/2023  8:09 AM      Failed - Lipid Panel in normal range within the last 12 months    Cholesterol  Date Value Ref Range Status  12/10/2022 123 <200 mg/dL Final  46/96/2952 841 0 - 200 mg/dL Final   Ldl Cholesterol, Calc  Date Value Ref Range Status  05/08/2013 94 0 - 100 mg/dL Final   LDL Cholesterol (Calc)  Date Value Ref Range Status  12/10/2022 61 mg/dL (calc) Final    Comment:    Reference range: <100 . Desirable range <100 mg/dL for primary prevention;   <70 mg/dL for patients with CHD or diabetic patients  with > or = 2 CHD risk factors. Marland Kitchen LDL-C is now calculated using the Martin-Hopkins  calculation, which is a validated novel method providing  better accuracy than the Friedewald equation in the  estimation of LDL-C.  Horald Pollen et al. Lenox Ahr. 3244;010(27): 2061-2068  (http://education.[facility].com/faq/FAQ164)    HDL Cholesterol  Date Value Ref Range Status  05/08/2013 40 40 - 60 mg/dL Final   HDL  Date Value Ref Range Status  12/10/2022 44 (L) > OR = 50 mg/dL Final   Triglycerides  Date Value Ref Range Status  12/10/2022 94 <150 mg/dL Final  25/36/6440 347 0 - 200 mg/dL Final         Passed - Patient is not pregnant      Passed - Valid encounter within last 12 months    Recent Outpatient Visits           2 months ago Morbid obesity [facility])   [facility] [place], Netta Neat, DO   6 months ago Left medial knee pain   [facility] [name], DO   7 months ago Urinary frequency   [facility] [place], Netta Neat, DO   10 months ago Annual physical exam    [facility] [name], DO   1 year ago Pre-op examination   [facility] [place], Netta Neat, [place]

## 2023-11-13 ENCOUNTER — Ambulatory Visit: Admitting: Family Medicine

## 2023-11-18 ENCOUNTER — Encounter: Payer: Self-pay | Admitting: Cardiology

## 2023-11-18 DIAGNOSIS — I10 Essential (primary) hypertension: Secondary | ICD-10-CM

## 2023-11-18 MED ORDER — AMLODIPINE BESYLATE 5 MG PO TABS: 5.0000 mg | ORAL_TABLET | Freq: Every day | ORAL | 1 refills | Status: DC

## 2023-11-27 ENCOUNTER — Ambulatory Visit
Admission: RE | Admit: 2023-11-27 | Discharge: 2023-11-27 | Disposition: A | Discharge status: Home / Self Care | Source: Ambulatory Visit | Attending: Family Medicine | Admitting: Family Medicine

## 2023-11-27 ENCOUNTER — Encounter: Payer: Self-pay | Admitting: Family Medicine

## 2023-11-27 ENCOUNTER — Ambulatory Visit
Admission: RE | Admit: 2023-11-27 | Discharge: 2023-11-27 | Disposition: A | Discharge status: Home / Self Care | Attending: Family Medicine | Admitting: Family Medicine

## 2023-11-27 ENCOUNTER — Ambulatory Visit: Admitting: Family Medicine

## 2023-11-27 VITALS — BP 122/80 | HR 87 | Ht 64.0 in | Wt 242.0 lb

## 2023-11-27 DIAGNOSIS — M25551 Pain in right hip: Secondary | ICD-10-CM

## 2023-11-27 DIAGNOSIS — M25561 Pain in right knee: Secondary | ICD-10-CM | POA: Insufficient documentation

## 2023-11-27 DIAGNOSIS — M25562 Pain in left knee: Secondary | ICD-10-CM | POA: Insufficient documentation

## 2023-11-27 DIAGNOSIS — G8929 Other chronic pain: Secondary | ICD-10-CM | POA: Diagnosis not present

## 2023-11-27 DIAGNOSIS — M25552 Pain in left hip: Secondary | ICD-10-CM | POA: Insufficient documentation

## 2023-11-27 DIAGNOSIS — M1611 Unilateral primary osteoarthritis, right hip: Secondary | ICD-10-CM

## 2023-11-27 DIAGNOSIS — M1712 Unilateral primary osteoarthritis, left knee: Secondary | ICD-10-CM

## 2023-11-27 MED ORDER — METHOCARBAMOL 500 MG PO TABS
500.0000 mg | ORAL_TABLET | Freq: Three times a day (TID) | ORAL | 1 refills | Status: AC | PRN
Start: 2023-11-27 — End: ?

## 2023-11-27 MED ORDER — GABAPENTIN 300 MG PO CAPS: 300.0000 mg | ORAL_CAPSULE | Freq: Three times a day (TID) | ORAL | 2 refills | Status: AC

## 2023-11-27 NOTE — Progress Notes (Signed)
 Subjective:    Patient ID: Morgan Stevens, female    DOB: [birth year], 58 y.o.   MRN: [id number]  Morgan Stevens is a 58 y.o. female presenting on 11/27/2023 for Hip Pain and Knee Pain   HPI  Discussed the use of AI scribe software for clinical note transcription with the patient, who gave verbal consent to proceed.  History of Present Illness   Morgan Stevens is a 58 year old female with degenerative arthritis who presents with chronic pain in the legs and hips.  She experiences chronic pain primarily in her legs and hips, which has been persistent for some time. The pain is severe enough to cause difficulty with activities such as climbing stairs and getting out of the car. She attributes some of the difficulty to her weight but is uncertain about the underlying cause of the pain.  She has a history of degenerative arthritis in the left knee, diagnosed in 2016, and reports that the left knee pain is worse than the right. The right hip pain is more severe than the left. There is no history of recent injury, but she mentions increased pain after a period of extensive walking while in [place], suggesting overuse as a potential factor. She has not had recent imaging studies for her hips or knees, with the last knee x-ray being from 2016.  She is currently managing her pain with over-the-counter Tylenol Arthritis Strength, taking it every eight hours. She has previously used baclofen and methocarbamol for muscle relaxation, with methocarbamol being more effective. Gabapentin has also been used for longer-term pain management, and she finds it helpful. She cannot take NSAID due to acid reflux gastritis history  During the review of symptoms, the pain does not involve grinding or sticking sensations in the joints, but there is a feeling of tightness or fullness, particularly in the back of the knee. She experiences difficulty with walking and climbing steps due to the hip pain.      For Left Shoulder  biceps tendonitis - Previously [facility] Allena Katz [phone]     11/27/2023    3:28 PM 10/02/2023    3:17 PM 08/27/2023    2:52 PM  Depression screen PHQ 2/9  Decreased Interest 2 2 2   Down, Depressed, Hopeless 3 2 2   PHQ - 2 Score 5 4 4   Altered sleeping 3 0 2  Tired, decreased energy 3 3 3   Change in appetite 3 2 3   Feeling bad or failure about yourself  2 0 3  Trouble concentrating 0 0 1  Moving slowly or fidgety/restless 0 0 0  Suicidal thoughts 0 0 0  PHQ-9 Score 16 9 16   Difficult doing work/chores Very difficult Somewhat difficult        11/27/2023    3:29 PM 10/02/2023    3:17 PM 08/27/2023    2:53 PM 05/01/2023    1:39 PM  GAD 7 : Generalized Anxiety Score  Nervous, Anxious, on Edge 3 2 3 3   Control/stop worrying 3 1 3 3   Worry too much - different things 3 1 3 3   Trouble relaxing 3 1 3 2   Restless 0 0 0 0  Easily annoyed or irritable 3 2 3 3   Afraid - awful might happen 2 1 2 2   Total GAD 7 Score 17 8 17 16   Anxiety Difficulty Very difficult Not difficult at all      Social History   Tobacco Use   Smoking status: Never   Smokeless tobacco:  Never  Vaping Use   Vaping status: Never Used  Substance Use Topics   Alcohol use: No    Alcohol/week: 0.0 standard drinks of alcohol   Drug use: No    Review of Systems Per HPI unless specifically indicated above     Objective:    BP 122/80 (BP Location: Right Arm, Patient Position: Sitting, Cuff Size: Large)   Pulse 87   Ht 5\' 4"  (1.626 m)   Wt 242 lb (109.8 kg)   LMP 07/11/2020   SpO2 99%   BMI 41.54 kg/m   Wt Readings from Last 3 Encounters:  11/27/23 242 lb (109.8 kg)  10/02/23 241 lb (109.3 kg)  09/23/23 238 lb (108 kg)    Physical Exam Vitals and nursing note reviewed.  Constitutional:      General: She is not in acute distress.    Appearance: Normal appearance. She is well-developed. She is obese. She is not diaphoretic.     Comments: Well-appearing, comfortable, cooperative  HENT:      Head: Normocephalic and atraumatic.  Eyes:     General:        Right eye: No discharge.        Left eye: No discharge.     Conjunctiva/sclera: Conjunctivae normal.  Cardiovascular:     Rate and Rhythm: Normal rate.  Pulmonary:     Effort: Pulmonary effort is normal.  Musculoskeletal:     Comments: Antalgic gait on ambulation Bilateral knees full ROM without significant crepitus  Right Hip some reduced mobility with hip flexion  Skin:    General: Skin is warm and dry.     Findings: No erythema or rash.  Neurological:     Mental Status: She is alert and oriented to person, place, and time.  Psychiatric:        Mood and Affect: Mood normal.        Behavior: Behavior normal.        Thought Content: Thought content normal.     Comments: Well groomed, good eye contact, normal speech and thoughts     Results for orders placed or performed during the hospital encounter of 09/23/23  Blood culture (routine x 2)   Collection Time: 09/23/23  8:14 AM   Specimen: BLOOD  Result Value Ref Range   Specimen Description BLOOD BLOOD RIGHT ARM    Special Requests      BOTTLES DRAWN AEROBIC AND ANAEROBIC Blood Culture results may not be optimal due to an inadequate volume of blood received in culture bottles   Culture      NO GROWTH 5 DAYS Performed at [facility], [address] [place], [place] [address]    Report Status 09/28/2023 FINAL   Blood culture (routine x 2)   Collection Time: 09/23/23  8:14 AM   Specimen: BLOOD  Result Value Ref Range   Specimen Description      BLOOD RIGHT ANTECUBITAL Performed at [facility], [address] [place], [place] [address]    Special Requests      BOTTLES DRAWN AEROBIC AND ANAEROBIC Blood Culture results may not be optimal due to an inadequate volume of blood received in culture bottles Performed at [facility], [address] [place], [place] [address]    Culture  Setup Time      GRAM POSITIVE COCCI IN  BOTH AEROBIC AND ANAEROBIC BOTTLES CRITICAL RESULT CALLED TO, READ BACK BY AND VERIFIED WITHBarbara Cower ROBBINS AT 6440 09/24/2023 JG Performed at [facility], [address], [place], [place] [address]    Culture (A)     STAPHYLOCOCCUS HOMINIS STAPHYLOCOCCUS EPIDERMIDIS THE SIGNIFICANCE OF ISOLATING THIS ORGANISM FROM A SINGLE SET OF BLOOD CULTURES WHEN MULTIPLE SETS ARE DRAWN IS UNCERTAIN. PLEASE NOTIFY THE MICROBIOLOGY DEPARTMENT WITHIN ONE WEEK IF SPECIATION AND SENSITIVITIES ARE REQUIRED. Performed at [facility], [address], [place], [place] [address]    Report Status 09/26/2023 FINAL   Blood Culture ID Panel (Reflexed)   Collection Time: 09/23/23  8:14 AM  Result Value Ref Range   Enterococcus faecalis NOT DETECTED NOT DETECTED   Enterococcus Faecium NOT DETECTED NOT DETECTED   Listeria monocytogenes NOT DETECTED NOT DETECTED   Staphylococcus species DETECTED (A) NOT DETECTED   Staphylococcus aureus (BCID) NOT DETECTED NOT DETECTED   Staphylococcus epidermidis NOT DETECTED NOT DETECTED   Staphylococcus lugdunensis NOT DETECTED NOT DETECTED   Streptococcus species NOT DETECTED NOT DETECTED   Streptococcus agalactiae NOT DETECTED NOT DETECTED   Streptococcus pneumoniae NOT DETECTED NOT DETECTED   Streptococcus pyogenes NOT DETECTED NOT DETECTED   A.calcoaceticus-baumannii NOT DETECTED NOT DETECTED   Bacteroides fragilis NOT DETECTED NOT DETECTED   Enterobacterales NOT DETECTED NOT DETECTED   Enterobacter cloacae complex NOT DETECTED NOT DETECTED   Escherichia coli NOT DETECTED NOT DETECTED   Klebsiella aerogenes NOT DETECTED NOT DETECTED   Klebsiella oxytoca NOT DETECTED NOT DETECTED   Klebsiella pneumoniae NOT DETECTED NOT DETECTED   Proteus species NOT DETECTED NOT DETECTED   Salmonella species NOT DETECTED NOT DETECTED   Serratia marcescens NOT DETECTED NOT DETECTED   Haemophilus influenzae NOT DETECTED NOT DETECTED   Neisseria meningitidis NOT DETECTED  NOT DETECTED   Pseudomonas aeruginosa NOT DETECTED NOT DETECTED   Stenotrophomonas maltophilia NOT DETECTED NOT DETECTED   Candida albicans NOT DETECTED NOT DETECTED   Candida auris NOT DETECTED NOT DETECTED   Candida glabrata NOT DETECTED NOT DETECTED   Candida krusei NOT DETECTED NOT DETECTED   Candida parapsilosis NOT DETECTED NOT DETECTED   Candida tropicalis NOT DETECTED NOT DETECTED   Cryptococcus neoformans/gattii NOT DETECTED NOT DETECTED  CBC with Differential   Collection Time: 09/23/23  8:14 AM  Result Value Ref Range   WBC 16.6 (H) 4.0 - 10.5 K/uL   RBC 4.04 3.87 - 5.11 MIL/uL   Hemoglobin 12.2 12.0 - 15.0 g/dL   HCT 82.9 (L) 56.2 - 13.0 %   MCV 83.2 80.0 - 100.0 fL   MCH 30.2 26.0 - 34.0 pg   MCHC 36.3 (H) 30.0 - 36.0 g/dL   RDW 86.5 78.4 - 69.6 %   Platelets 246 150 - 400 K/uL   nRBC 0.0 0.0 - 0.2 %   Neutrophils Relative % 88 %   Neutro Abs 14.6 (H) 1.7 - 7.7 K/uL   Lymphocytes Relative 6 %   Lymphs Abs 1.0 0.7 - 4.0 K/uL   Monocytes Relative 4 %   Monocytes Absolute 0.7 0.1 - 1.0 K/uL   Eosinophils Relative 0 %   Eosinophils Absolute 0.1 0.0 - 0.5 K/uL   Basophils Relative 1 %   Basophils Absolute 0.1 0.0 - 0.1 K/uL   WBC Morphology DOHLE BODIES    RBC Morphology MORPHOLOGY UNREMARKABLE    Smear Review MORPHOLOGY UNREMARKABLE    Immature Granulocytes 1 %   Abs Immature Granulocytes 0.12 (H) 0.00 - 0.07 K/uL  Comprehensive metabolic panel   Collection Time: 09/23/23  8:14 AM  Result Value Ref Range   Sodium 120 (L) 135 -  145 mmol/L   Potassium 2.9 (L) 3.5 - 5.1 mmol/L   Chloride 86 (L) 98 - 111 mmol/L   CO2 20 (L) 22 - 32 mmol/L   Glucose, Bld 152 (H) 70 - 99 mg/dL   BUN 15 6 - 20 mg/dL   Creatinine, Ser 0.27 (H) 0.44 - 1.00 mg/dL   Calcium 8.0 (L) 8.9 - 10.3 mg/dL   Total Protein 7.4 6.5 - 8.1 g/dL   Albumin 3.0 (L) 3.5 - 5.0 g/dL   AST 33 15 - 41 U/L   ALT 35 0 - 44 U/L   Alkaline Phosphatase 81 38 - 126 U/L   Total Bilirubin 2.5 (H) 0.0 - 1.2  mg/dL   GFR, Estimated >25 >36 mL/min   Anion gap 14 5 - 15  Lactic acid, plasma   Collection Time: 09/23/23  8:14 AM  Result Value Ref Range   Lactic Acid, Venous 2.7 (HH) 0.5 - 1.9 mmol/L  Magnesium   Collection Time: 09/23/23  8:14 AM  Result Value Ref Range   Magnesium 1.7 1.7 - 2.4 mg/dL  Bilirubin, direct   Collection Time: 09/23/23  8:14 AM  Result Value Ref Range   Bilirubin, Direct 1.1 (H) 0.0 - 0.2 mg/dL  Troponin I (High Sensitivity)   Collection Time: 09/23/23  8:14 AM  Result Value Ref Range   Troponin I (High Sensitivity) 15 <18 ng/L  Osmolality   Collection Time: 09/23/23  9:56 AM  Result Value Ref Range   Osmolality 262 (L) 275 - 295 mOsm/kg  Lactic acid, plasma   Collection Time: 09/23/23  9:58 AM  Result Value Ref Range   Lactic Acid, Venous 1.9 0.5 - 1.9 mmol/L  TSH   Collection Time: 09/23/23  9:58 AM  Result Value Ref Range   TSH 2.049 0.350 - 4.500 uIU/mL  Troponin I (High Sensitivity)   Collection Time: 09/23/23  9:58 AM  Result Value Ref Range   Troponin I (High Sensitivity) 5 <18 ng/L  MRSA Next Gen by PCR, Nasal   Collection Time: 09/23/23  3:53 PM   Specimen: Nasal Mucosa; Nasal Swab  Result Value Ref Range   MRSA by PCR Next Gen NOT DETECTED NOT DETECTED  Basic metabolic panel   Collection Time: 09/23/23  3:53 PM  Result Value Ref Range   Sodium 124 (L) 135 - 145 mmol/L   Potassium 3.3 (L) 3.5 - 5.1 mmol/L   Chloride 92 (L) 98 - 111 mmol/L   CO2 21 (L) 22 - 32 mmol/L   Glucose, Bld 126 (H) 70 - 99 mg/dL   BUN 11 6 - 20 mg/dL   Creatinine, Ser 6.44 0.44 - 1.00 mg/dL   Calcium 7.3 (L) 8.9 - 10.3 mg/dL   GFR, Estimated >03 >47 mL/min   Anion gap 11 5 - 15  HIV Antibody (routine testing w rflx)   Collection Time: 09/23/23  3:53 PM  Result Value Ref Range   HIV Screen 4th Generation wRfx Non Reactive Non Reactive  Expectorated Sputum Assessment w Gram Stain, Rflx to Resp Cult   Collection Time: 09/23/23  4:04 PM   Specimen: Sputum   Result Value Ref Range   Specimen Description SPUTUM    Special Requests NONE    Sputum evaluation      THIS SPECIMEN IS ACCEPTABLE FOR SPUTUM CULTURE Performed at [facility], [address] [place], [place] [address]    Report Status 09/23/2023 FINAL   Culture, Respiratory w Gram Stain   Collection Time:  09/23/23  4:04 PM   Specimen: SPU  Result Value Ref Range   Specimen Description      SPUTUM Performed at [facility], [address], [place], [place] [address]    Special Requests      NONE Reflexed from [id number] Performed at [facility], [address] [place], [place] [address]    Gram Stain      ABUNDANT SQUAMOUS EPITHELIAL CELLS PRESENT ABUNDANT WBC PRESENT, PREDOMINANTLY PMN RARE YEAST FEW GRAM POSITIVE COCCI RARE GRAM NEGATIVE RODS    Culture      FEW Normal respiratory flora-no Staph aureus or Pseudomonas seen Performed at [facility], [address] [place], [place] [address]    Report Status 09/26/2023 FINAL   Osmolality, urine   Collection Time: 09/23/23  5:45 PM  Result Value Ref Range   Osmolality, Ur 487 300 - 900 mOsm/kg  Sodium, urine, random   Collection Time: 09/23/23  5:45 PM  Result Value Ref Range   Sodium, Ur 11 mmol/L  Sodium   Collection Time: 09/24/23 12:12 AM  Result Value Ref Range   Sodium 129 (L) 135 - 145 mmol/L  CBC   Collection Time: 09/24/23  6:35 AM  Result Value Ref Range   WBC 13.2 (H) 4.0 - 10.5 K/uL   RBC 3.35 (L) 3.87 - 5.11 MIL/uL   Hemoglobin 10.1 (L) 12.0 - 15.0 g/dL   HCT 01.0 (L) 27.2 - 53.6 %   MCV 83.0 80.0 - 100.0 fL   MCH 30.1 26.0 - 34.0 pg   MCHC 36.3 (H) 30.0 - 36.0 g/dL   RDW 64.4 03.4 - 74.2 %   Platelets 280 150 - 400 K/uL   nRBC 0.0 0.0 - 0.2 %  Basic metabolic panel   Collection Time: 09/24/23  6:35 AM  Result Value Ref Range   Sodium 131 (L) 135 - 145 mmol/L   Potassium 2.8 (L) 3.5 - 5.1 mmol/L   Chloride 98 98 - 111 mmol/L   CO2 23 22 - 32 mmol/L    Glucose, Bld 120 (H) 70 - 99 mg/dL   BUN 9 6 - 20 mg/dL   Creatinine, Ser 5.95 0.44 - 1.00 mg/dL   Calcium 7.9 (L) 8.9 - 10.3 mg/dL   GFR, Estimated >63 >87 mL/min   Anion gap 10 5 - 15  CBC with Differential/Platelet   Collection Time: 09/26/23  6:14 AM  Result Value Ref Range   WBC 11.2 (H) 4.0 - 10.5 K/uL   RBC 3.44 (L) 3.87 - 5.11 MIL/uL   Hemoglobin 10.2 (L) 12.0 - 15.0 g/dL   HCT 56.4 (L) 33.2 - 95.1 %   MCV 86.0 80.0 - 100.0 fL   MCH 29.7 26.0 - 34.0 pg   MCHC 34.5 30.0 - 36.0 g/dL   RDW 88.4 16.6 - 06.3 %   Platelets 507 (H) 150 - 400 K/uL   nRBC 0.0 0.0 - 0.2 %   Neutrophils Relative % 66 %   Neutro Abs 7.4 1.7 - 7.7 K/uL   Lymphocytes Relative 22 %   Lymphs Abs 2.4 0.7 - 4.0 K/uL   Monocytes Relative 8 %   Monocytes Absolute 0.8 0.1 - 1.0 K/uL   Eosinophils Relative 0 %   Eosinophils Absolute 0.0 0.0 - 0.5 K/uL   Basophils Relative 0 %   Basophils Absolute 0.0 0.0 - 0.1 K/uL   WBC Morphology Mild Left Shift (1-5% metas, occ myelo)    RBC Morphology MORPHOLOGY UNREMARKABLE  Smear Review PLATELET CLUMPS NOTED ON SMEAR    Immature Granulocytes 4 %   Abs Immature Granulocytes 0.45 (H) 0.00 - 0.07 K/uL  Basic metabolic panel   Collection Time: 09/26/23  6:14 AM  Result Value Ref Range   Sodium 136 135 - 145 mmol/L   Potassium 3.8 3.5 - 5.1 mmol/L   Chloride 100 98 - 111 mmol/L   CO2 26 22 - 32 mmol/L   Glucose, Bld 143 (H) 70 - 99 mg/dL   BUN 12 6 - 20 mg/dL   Creatinine, Ser 7.82 0.44 - 1.00 mg/dL   Calcium 8.2 (L) 8.9 - 10.3 mg/dL   GFR, Estimated >95 >62 mL/min   Anion gap 10 5 - 15      Assessment & Plan:   Problem List Items Addressed This Visit   None Visit Diagnoses       Chronic hip pain, bilateral    -  Primary   Relevant Medications   methocarbamol (ROBAXIN) 500 MG tablet   gabapentin (NEURONTIN) 300 MG capsule   Other Relevant Orders   DG HIP UNILAT W OR W/O PELVIS 2-3 VIEWS RIGHT     Chronic pain of both knees       Relevant Medications    methocarbamol (ROBAXIN) 500 MG tablet   gabapentin (NEURONTIN) 300 MG capsule   Other Relevant Orders   DG Knee Complete 4 Views Left     Primary osteoarthritis of left knee       Relevant Medications   methocarbamol (ROBAXIN) 500 MG tablet   gabapentin (NEURONTIN) 300 MG capsule   Other Relevant Orders   DG Knee Complete 4 Views Left     Primary osteoarthritis of right hip       Relevant Medications   methocarbamol (ROBAXIN) 500 MG tablet   gabapentin (NEURONTIN) 300 MG capsule   Other Relevant Orders   DG HIP UNILAT W OR W/O PELVIS 2-3 VIEWS RIGHT        Bilateral Hip and Knee Pain Obesity Suspected Osteoarthritis  Chronic bilateral hip and knee pain, more severe in left knee and right hip, likely due to overuse and degenerative changes. Previous imaging indicated left knee degenerative arthritis.  She cannot take NSAID. Current acetaminophen insufficient.  Discussed treatment options including medications, physical therapy, joint injections, and orthopedic referral. Emphasized activity modification. Discussed methocarbamol and gabapentin for pain management.  - Order R hip and L knee x-rays today - Discontinue Baclofen - Prescribe methocarbamol 500 mg, TID PRN for muscle relaxation. - Prescribe gabapentin 300 mg, titrate to TID as tolerated for pain. - Continue acetaminophen regular dosing - Consider orthopedic referral based on x-ray results and medication response. - Discuss joint injections if symptoms persist. - Advise activity modification, including low-impact exercises.        Orders Placed This Encounter  Procedures   DG Knee Complete 4 Views Left    Standing Status:   Future    Number of Occurrences:   1    Expiration Date:   11/26/2024    Reason for Exam (SYMPTOM  OR DIAGNOSIS REQUIRED):   chronic Left > R knee pain, osteoarthritis    Is patient pregnant?:   No    Preferred imaging location?:   [facility]   DG HIP UNILAT W OR W/O PELVIS 2-3 VIEWS  RIGHT    Standing Status:   Future    Number of Occurrences:   1    Expiration Date:   02/26/2024  Reason for Exam (SYMPTOM  OR DIAGNOSIS REQUIRED):   chronic right hip pain, know osteoarthritis    Is patient pregnant?:   No    Preferred imaging location?:   [facility]    Radiology Contrast Protocol - do NOT remove file path:   [email or web address]    Meds ordered this encounter  Medications   methocarbamol (ROBAXIN) 500 MG tablet    Sig: Take 1 tablet (500 mg total) by mouth every 8 (eight) hours as needed for muscle spasms.    Dispense:  90 tablet    Refill:  1   gabapentin (NEURONTIN) 300 MG capsule    Sig: Take 1 capsule (300 mg total) by mouth 3 (three) times daily.    Dispense:  90 capsule    Refill:  2    Follow up plan: Return if symptoms worsen or fail to improve.  Saralyn Pilar, DO [facility] 11/27/2023, 4:03 PM

## 2023-11-27 NOTE — Patient Instructions (Addendum)

## 2023-11-28 ENCOUNTER — Encounter: Payer: Self-pay | Admitting: Family Medicine

## 2023-12-13 ENCOUNTER — Other Ambulatory Visit: Payer: Self-pay

## 2023-12-13 ENCOUNTER — Emergency Department
Admission: EM | Admit: 2023-12-13 | Discharge: 2023-12-13 | Disposition: A | Discharge status: Home / Self Care | Attending: Emergency Medicine | Admitting: Emergency Medicine

## 2023-12-13 DIAGNOSIS — M25512 Pain in left shoulder: Secondary | ICD-10-CM | POA: Insufficient documentation

## 2023-12-13 DIAGNOSIS — M546 Pain in thoracic spine: Secondary | ICD-10-CM | POA: Diagnosis present

## 2023-12-13 DIAGNOSIS — M5412 Radiculopathy, cervical region: Secondary | ICD-10-CM | POA: Diagnosis not present

## 2023-12-13 MED ORDER — PREDNISONE 20 MG PO TABS
60.0000 mg | ORAL_TABLET | Freq: Once | ORAL | Status: AC
  Administered 2023-12-13: 60 mg via ORAL
  Filled 2023-12-13: qty 3

## 2023-12-13 MED ORDER — LIDOCAINE 5 % EX PTCH
1.0000 | MEDICATED_PATCH | CUTANEOUS | Status: DC
  Administered 2023-12-13: 1 via TRANSDERMAL
  Filled 2023-12-13: qty 1

## 2023-12-13 MED ORDER — PREDNISONE 10 MG (21) PO TBPK: ORAL_TABLET | ORAL | 0 refills | Status: DC

## 2023-12-13 MED ORDER — LIDOCAINE 5 % EX PTCH: 1.0000 | MEDICATED_PATCH | CUTANEOUS | 0 refills | Status: AC

## 2023-12-13 NOTE — ED Provider Notes (Signed)
 [facility] Provider Note    Event Date/Time   First MD Initiated Contact with Patient 12/13/23 [phone]     (approximate)   History   Shoulder Pain   HPI  Morgan Stevens is a 58 y.o. female who presents to the emergency department today with left upper back pain.  Symptoms started around 7:00 last night.  They have been steady since then.  They made it hard for her to sleep.  Pain is somewhat worsened with movement.  She denies any associated shortness of breath or cough.  Denies any fevers.  Denies any recent unusual activity.  Denies similar pain in the past.  Does take gabapentin  for other issues and tried it without any significant relief.     Physical Exam   Triage Vital Signs: ED Triage Vitals  Encounter Vitals Group     BP 12/13/23 0512 (!) 140/80     Systolic BP Percentile --      Diastolic BP Percentile --      Pulse Rate 12/13/23 0512 81     Resp 12/13/23 0512 18     Temp 12/13/23 0512 98 F (36.7 C)     Temp Source 12/13/23 0512 Oral     SpO2 12/13/23 0512 100 %     Weight 12/13/23 0454 233 lb (105.7 kg)     Height 12/13/23 0454 5\' 4"  (1.626 m)     Head Circumference --      Peak Flow --      Pain Score 12/13/23 0454 8     Pain Loc --      Pain Education --      Exclude from Growth Chart --     Most recent vital signs: Vitals:   12/13/23 0512  BP: (!) 140/80  Pulse: 81  Resp: 18  Temp: 98 F (36.7 C)  SpO2: 100%   General: Awake, alert, oriented. CV:  Good peripheral perfusion. Regular rate and rhythm. Resp:  Normal effort. Lungs clear. Abd:  No distention.  Other:  Left trapezius with some tenderness to palpation. Left radial pulse 2+.   ED Results / Procedures / Treatments   Labs (all labs ordered are listed, but only abnormal results are displayed) Labs Reviewed - No data to display   EKG  None   RADIOLOGY None   PROCEDURES:  Critical Care performed: No   MEDICATIONS ORDERED IN ED: Medications   predniSONE  (DELTASONE ) tablet 60 mg (has no administration in time range)  lidocaine  (LIDODERM ) 5 % 1 patch (has no administration in time range)     IMPRESSION / MDM / ASSESSMENT AND PLAN / ED COURSE  I reviewed the triage vital signs and the nursing notes.                              Differential diagnosis includes, but is not limited to, cervical radiculopathy, arterial occlusion, pneumonia  Patient's presentation is most consistent with acute presentation with potential threat to life or bodily function.   Patient presents to the emergency department today with a left upper back pain.  On exam patient does have some tenderness to her left trapezius.  With this.  To be made worse with movement.  Neurovascularly intact distally in the left upper extremity.  Lungs are clear.  Patient without any respiratory distress or cough.  This time I do think cervical radiculopathy likely.  I discussed this with the patient.  Will plan on giving dose of steroids here as well as Lidoderm  patch.  Patient already takes gabapentin . Discussed return precautions.       FINAL CLINICAL IMPRESSION(S) / ED DIAGNOSES   Final diagnoses:  Cervical radiculopathy    Note:  This document was prepared using Dragon voice recognition software and may include unintentional dictation errors.    Marylynn Soho, MD 12/13/23 [phone]

## 2023-12-13 NOTE — ED Triage Notes (Signed)
 Pt reports left shoulder and upper back pain that began last night. Pt denies fall or injury to area. Pt states pain is worse with movement.

## 2023-12-23 NOTE — Progress Notes (Unsigned)
 Referring Physician:  No referring provider defined for this encounter.  Primary Physician:  Raina Bunting, DO  History of Present Illness: 12/24/2023 [name] has a history of HTN, GERD, obesity, conversion disorder, and history of cervical myelopathy.   She has history of ACDF C5-C7 on 11/14/22.   Last seen by Dr. Mont Antis on 06/13/23 and she was doing well.   Seen in ED on 12/13/23 for left shoulder/trapezial pain. Was given steroid pack and lidoderm  patches.   2 week history of constant left sided neck pain into her shoulder. No arm pain. She notes numbness/tingling on left side of her face. No numbness, tingling, or weakness in her arms. Pain is worse with movement. Pain is better with rest and medications. No issues with hand dexterity.   She is taking neurontin  and robaxin . Not much relief with steroid pack.   She does not smoke.   Conservative measures:  Physical therapy: none recent  Multimodal medical therapy including regular antiinflammatories: steroid dose pack, neurontin , robaxin   Injections: no recent ESIs  Past Surgery:  ACDF C5-C7 on 11/14/22  Morgan Stevens has no symptoms of cervical myelopathy. Has some balance issues that are unchanged.   The symptoms are causing a significant impact on the patient's life.   Review of Systems:  A 10 point review of systems is negative, except for the pertinent positives and negatives detailed in the HPI.  Past Medical History: Past Medical History:  Diagnosis Date   Anemia    Chronic headaches    several per week   Gastritis    GERD (gastroesophageal reflux disease)    Hiatal hernia    Hyperlipidemia    Hypertension    Hypothyroidism    Obesity (BMI 30-39.9)    Pre-diabetes    Reflux    Spinal stenosis    Thyroid  disease    Vertigo    rare   Wears contact lenses     Past Surgical History: Past Surgical History:  Procedure Laterality Date   ANTERIOR CERVICAL DECOMP/DISCECTOMY FUSION  N/A 11/14/2022   Procedure: C5-7 ANTERIOR CERVICAL DISCECTOMY AND FUSION (NUVASIVE ACP, GLOBUS HEDRON);  Surgeon: Jodeen Munch, MD;  Location: [facility] ORS;  Service: Neurosurgery;  Laterality: N/A;   CARDIAC CATHETERIZATION  02/27/2016   Procedure: Left Heart Cath and Coronary Angiography;  Surgeon: Lucendia Rusk, MD;  Location: [facility] INVASIVE CV LAB;  Service: Cardiovascular;;   CESAREAN SECTION     CHOLECYSTECTOMY     COLONOSCOPY WITH PROPOFOL  N/A 04/15/2019   Procedure: COLONOSCOPY WITH PROPOFOL ;  Surgeon: Irby Mannan, MD;  Location: [facility] ENDOSCOPY;  Service: Endoscopy;  Laterality: N/A;   ESOPHAGOGASTRODUODENOSCOPY N/A 11/05/2022   Procedure: ESOPHAGOGASTRODUODENOSCOPY (EGD);  Surgeon: Quintin Buckle, DO;  Location: [facility] ENDOSCOPY;  Service: Gastroenterology;  Laterality: N/A;   ESOPHAGOGASTRODUODENOSCOPY (EGD) WITH PROPOFOL  N/A 04/15/2019   Procedure: ESOPHAGOGASTRODUODENOSCOPY (EGD) WITH PROPOFOL ;  Surgeon: Irby Mannan, MD;  Location: [facility] ENDOSCOPY;  Service: Endoscopy;  Laterality: N/A;   SHOULDER ARTHROSCOPY WITH DEBRIDEMENT AND BICEP TENDON REPAIR Left 02/06/2022   Procedure: Left shoulder arthroscopic debridement, subacromial decompression, open subpectoral biceps tenodesis, and  Regeneten Patch application;  Surgeon: Lorri Rota, MD;  Location: [facility];  Service: Orthopedics;  Laterality: Left;   THYROIDECTOMY  2018   THYROIDECTOMY      Allergies: Allergies as of 12/24/2023   (No Known Allergies)    Medications: Outpatient Encounter Medications as of 12/24/2023  Medication Sig   amLODipine  (NORVASC ) 5 MG tablet Take 1  tablet (5 mg total) by mouth daily.   aspirin  EC 81 MG tablet Take 81 mg by mouth at bedtime. Swallow whole.   atorvastatin  (LIPITOR) 20 MG tablet TAKE 1 TABLET BY MOUTH AT BEDTIME   Calcium  Carbonate-Simethicone  (TUMS GAS RELIEF CHEWY BITES PO) Take by mouth as needed.   esomeprazole  (NEXIUM ) 40 MG capsule Take 1  capsule (40 mg total) by mouth 2 (two) times daily before a meal.   EUTHYROX  125 MCG tablet TAKE 1 TABLET BY MOUTH ONCE DAILY BEFORE BREAKFAST   famotidine  (PEPCID ) 20 MG tablet Take 1 tablet (20 mg total) by mouth 2 (two) times daily.   gabapentin  (NEURONTIN ) 300 MG capsule Take 1 capsule (300 mg total) by mouth 3 (three) times daily.   [EXPIRED] lidocaine  (LIDODERM ) 5 % Place 1 patch onto the skin daily for 10 days. Remove & Discard patch within 12 hours or as directed by MD   methocarbamol  (ROBAXIN ) 500 MG tablet Take 1 tablet (500 mg total) by mouth every 8 (eight) hours as needed for muscle spasms.   montelukast  (SINGULAIR ) 10 MG tablet Take 1 tablet (10 mg total) by mouth at bedtime.   nystatin  (MYCOSTATIN ) 100000 UNIT/ML suspension Take 5 mLs (500,000 Units total) by mouth 4 (four) times daily. (Patient not taking: Reported on 11/27/2023)   predniSONE  (STERAPRED UNI-PAK 21 TAB) 10 MG (21) TBPK tablet Per packaging instructions. Start 4/19   VENTOLIN  HFA 108 (90 Base) MCG/ACT inhaler Inhale 2 puffs into the lungs every 4 (four) hours as needed for wheezing or shortness of breath.   No facility-administered encounter medications on file as of 12/24/2023.    Social History: Social History   Tobacco Use   Smoking status: Never   Smokeless tobacco: Never  Vaping Use   Vaping status: Never Used  Substance Use Topics   Alcohol use: No    Alcohol/week: 0.0 standard drinks of alcohol   Drug use: No    Family Medical History: Family History  Problem Relation Age of Onset   Congestive Heart Failure Mother    Breast cancer Maternal Aunt        22s   Breast cancer Maternal Aunt        74s   Breast cancer Maternal Aunt        54s    Physical Examination: There were no vitals filed for this visit.    Awake, alert, oriented to person, place, and time.  Speech is clear and fluent. Fund of knowledge is appropriate.   Cranial Nerves: Pupils equal round and reactive to light.  Facial  tone is symmetric.    No posterior cervical tenderness. Mild tenderness in left trapezial region.   Well healed anterior cervical incision.   No abnormal lesions on exposed skin.   Strength: Side Biceps Triceps Deltoid Interossei Grip Wrist Ext. Wrist Flex.  R 5 5 5 5 5 5 5   L 5 5 5 5 5 5 5    Side Iliopsoas Quads Hamstring PF DF EHL  R 5 5 5 5 5 5   L 5 5 5 5 5 5    Reflexes are 2+ and symmetric at the biceps, brachioradialis, patella and achilles.   Hoffman's is absent.  Clonus is not present.   Bilateral upper and lower extremity sensation is intact to light touch.     Good ROM of both shoulders with minimal pain.   Gait is normal.     Medical Decision Making  Imaging: No recent cervical imaging.   Assessment  and Plan: Morgan Stevens has a history of ACDF C5-C7 on 11/14/22.   2 week history of constant left sided neck pain into her shoulder. No arm pain. She notes numbness/tingling on left side of her face. No numbness, tingling, or weakness in her arms. No issues with hand dexterity.   No recent cervical imaging done. Pain appears more myofascial in nature.   Treatment options discussed with patient and following plan made:   - Xrays of cervical spine ordered. Will message her with results tomorrow and wait on formal read.  - If xrays look okay, consider PT for cervical spine. She has no preference, but wants to stay in [place].  - If no improvement with above, consider cervical MRI.  - Numbness and tingling left side of face is not cervical mediated. Advised to follow up with PCP.  - Will schedule f/u when I message her about xrays.    I spent a total of 20 minutes in face-to-face and non-face-to-face activities related to this patient's care today including review of outside records, review of imaging, review of symptoms, physical exam, discussion of differential diagnosis, discussion of treatment options, and documentation.   Lucetta Russel PA-C Dept. of  Neurosurgery

## 2023-12-24 ENCOUNTER — Ambulatory Visit
Admission: RE | Admit: 2023-12-24 | Discharge: 2023-12-24 | Disposition: A | Discharge status: Home / Self Care | Attending: Orthopedic Surgery | Admitting: Orthopedic Surgery

## 2023-12-24 ENCOUNTER — Encounter: Payer: Self-pay | Admitting: Orthopedic Surgery

## 2023-12-24 ENCOUNTER — Ambulatory Visit
Admission: RE | Admit: 2023-12-24 | Discharge: 2023-12-24 | Disposition: A | Discharge status: Home / Self Care | Source: Ambulatory Visit | Attending: Orthopedic Surgery | Admitting: Orthopedic Surgery

## 2023-12-24 ENCOUNTER — Ambulatory Visit: Admitting: Orthopedic Surgery

## 2023-12-24 DIAGNOSIS — Z981 Arthrodesis status: Secondary | ICD-10-CM | POA: Diagnosis not present

## 2023-12-24 DIAGNOSIS — M542 Cervicalgia: Secondary | ICD-10-CM

## 2023-12-24 DIAGNOSIS — Z4789 Encounter for other orthopedic aftercare: Secondary | ICD-10-CM | POA: Diagnosis not present

## 2023-12-25 ENCOUNTER — Other Ambulatory Visit: Payer: Self-pay | Admitting: Family Medicine

## 2023-12-25 ENCOUNTER — Ambulatory Visit: Admitting: Orthopedic Surgery

## 2023-12-25 DIAGNOSIS — J3089 Other allergic rhinitis: Secondary | ICD-10-CM

## 2023-12-25 DIAGNOSIS — J9801 Acute bronchospasm: Secondary | ICD-10-CM

## 2023-12-25 DIAGNOSIS — R053 Chronic cough: Secondary | ICD-10-CM

## 2023-12-26 ENCOUNTER — Other Ambulatory Visit: Payer: Self-pay

## 2023-12-27 ENCOUNTER — Encounter: Payer: Self-pay | Admitting: Family Medicine

## 2023-12-28 NOTE — Telephone Encounter (Signed)
 Requested Prescriptions  Pending Prescriptions Disp Refills   montelukast  (SINGULAIR ) 10 MG tablet [Pharmacy Med Name: Montelukast  Sodium 10 MG Oral Tablet] 90 tablet 1    Sig: TAKE 1 TABLET BY MOUTH AT BEDTIME     Pulmonology:  Leukotriene Inhibitors Passed - 12/28/2023  9:31 PM      Passed - Valid encounter within last 12 months    Recent Outpatient Visits           1 month ago Chronic hip pain, bilateral   [place] [facility] Raina Bunting, DO   2 months ago Oral thrush   [place] [facility] [place], Rankin Buzzard, NP   2 months ago Multifocal pneumonia   [place] [facility] [place], Kayleen Party, [place]

## 2023-12-31 ENCOUNTER — Emergency Department (HOSPITAL_COMMUNITY): Admission: EM | Admit: 2023-12-31 | Discharge: 2023-12-31 | Discharge status: Against Medical Advice

## 2023-12-31 NOTE — ED Notes (Signed)
 Pt was called 3rd times for triage no answer.

## 2024-01-07 ENCOUNTER — Encounter: Payer: Medicaid Other | Admitting: Family Medicine

## 2024-01-22 ENCOUNTER — Other Ambulatory Visit: Payer: Self-pay | Admitting: Medical Genetics

## 2024-01-22 ENCOUNTER — Other Ambulatory Visit

## 2024-01-22 DIAGNOSIS — Z0000 Encounter for general adult medical examination without abnormal findings: Secondary | ICD-10-CM

## 2024-01-22 DIAGNOSIS — E7800 Pure hypercholesterolemia, unspecified: Secondary | ICD-10-CM | POA: Diagnosis not present

## 2024-01-22 DIAGNOSIS — E890 Postprocedural hypothyroidism: Secondary | ICD-10-CM

## 2024-01-22 DIAGNOSIS — R7303 Prediabetes: Secondary | ICD-10-CM | POA: Diagnosis not present

## 2024-01-23 ENCOUNTER — Other Ambulatory Visit

## 2024-01-23 LAB — CBC WITH DIFFERENTIAL/PLATELET
Absolute Lymphocytes: 2782 {cells}/uL (ref 850–3900)
Absolute Monocytes: 399 {cells}/uL (ref 200–950)
Basophils Absolute: 80 {cells}/uL (ref 0–200)
Basophils Relative: 1.4 %
Eosinophils Absolute: 188 {cells}/uL (ref 15–500)
Eosinophils Relative: 3.3 %
HCT: 39.5 % (ref 35.0–45.0)
Hemoglobin: 13.3 g/dL (ref 11.7–15.5)
MCH: 30.1 pg (ref 27.0–33.0)
MCHC: 33.7 g/dL (ref 32.0–36.0)
MCV: 89.4 fL (ref 80.0–100.0)
MPV: 10.5 fL (ref 7.5–12.5)
Monocytes Relative: 7 %
Neutro Abs: 2252 {cells}/uL (ref 1500–7800)
Neutrophils Relative %: 39.5 %
Platelets: 354 10*3/uL (ref 140–400)
RBC: 4.42 10*6/uL (ref 3.80–5.10)
RDW: 13.1 % (ref 11.0–15.0)
Total Lymphocyte: 48.8 %
WBC: 5.7 10*3/uL (ref 3.8–10.8)

## 2024-01-23 LAB — LIPID PANEL
Cholesterol: 128 mg/dL (ref <200)
HDL: 42 mg/dL — ABNORMAL LOW (ref >=50)
LDL Cholesterol (Calc): 68 mg/dL
Non-HDL Cholesterol (Calc): 86 mg/dL (ref <130)
Total CHOL/HDL Ratio: 3 (calc) (ref <5.0)
Triglycerides: 95 mg/dL (ref <150)

## 2024-01-23 LAB — HEMOGLOBIN A1C
Hgb A1c MFr Bld: 6.2 % — ABNORMAL HIGH (ref <5.7)
Mean Plasma Glucose: 131 mg/dL
eAG (mmol/L): 7.3 mmol/L

## 2024-01-23 LAB — COMPLETE METABOLIC PANEL WITHOUT GFR
AG Ratio: 1.6 (calc) (ref 1.0–2.5)
ALT: 43 U/L — ABNORMAL HIGH (ref 6–29)
AST: 23 U/L (ref 10–35)
Albumin: 4.3 g/dL (ref 3.6–5.1)
Alkaline phosphatase (APISO): 61 U/L (ref 37–153)
BUN: 12 mg/dL (ref 7–25)
CO2: 32 mmol/L (ref 20–32)
Calcium: 9.3 mg/dL (ref 8.6–10.4)
Chloride: 103 mmol/L (ref 98–110)
Creat: 0.75 mg/dL (ref 0.50–1.03)
Globulin: 2.7 g/dL (ref 1.9–3.7)
Glucose, Bld: 139 mg/dL — ABNORMAL HIGH (ref 65–99)
Potassium: 4 mmol/L (ref 3.5–5.3)
Sodium: 141 mmol/L (ref 135–146)
Total Bilirubin: 0.7 mg/dL (ref 0.2–1.2)
Total Protein: 7 g/dL (ref 6.1–8.1)

## 2024-01-23 LAB — TSH: TSH: 2.38 m[IU]/L

## 2024-01-23 LAB — T4, FREE: Free T4: 1.3 ng/dL (ref 0.8–1.8)

## 2024-01-29 ENCOUNTER — Encounter: Admitting: Family Medicine

## 2024-02-07 ENCOUNTER — Other Ambulatory Visit: Payer: Self-pay | Admitting: Family Medicine

## 2024-02-07 DIAGNOSIS — E7800 Pure hypercholesterolemia, unspecified: Secondary | ICD-10-CM

## 2024-02-10 NOTE — Telephone Encounter (Signed)
 Requested Prescriptions  Pending Prescriptions Disp Refills   atorvastatin  (LIPITOR) 20 MG tablet [Pharmacy Med Name: Atorvastatin  Calcium  20 MG Oral Tablet] 90 tablet 1    Sig: TAKE 1 TABLET BY MOUTH AT BEDTIME     Cardiovascular:  Antilipid - Statins Failed - 02/10/2024  1:48 PM      Failed - Lipid Panel in normal range within the last 12 months    Cholesterol  Date Value Ref Range Status  01/22/2024 128 <200 mg/dL Final  40/98/1191 478 0 - 200 mg/dL Final   Ldl Cholesterol, Calc  Date Value Ref Range Status  05/08/2013 94 0 - 100 mg/dL Final   LDL Cholesterol (Calc)  Date Value Ref Range Status  01/22/2024 68 mg/dL (calc) Final    Comment:    Reference range: <100 . Desirable range <100 mg/dL for primary prevention;   <70 mg/dL for patients with CHD or diabetic patients  with > or = 2 CHD risk factors. Aaron Aas LDL-C is now calculated using the Martin-Hopkins  calculation, which is a validated novel method providing  better accuracy than the Friedewald equation in the  estimation of LDL-C.  Melinda Sprawls et al. Erroll Heard. 2956;213(08): 2061-2068  (http://education.[employer].com/faq/FAQ164)    HDL Cholesterol  Date Value Ref Range Status  05/08/2013 40 40 - 60 mg/dL Final   HDL  Date Value Ref Range Status  01/22/2024 42 (L) > OR = 50 mg/dL Final   Triglycerides  Date Value Ref Range Status  01/22/2024 95 <150 mg/dL Final  65/78/4696 295 0 - 200 mg/dL Final         Passed - Patient is not pregnant      Passed - Valid encounter within last 12 months    Recent Outpatient Visits           2 months ago Chronic hip pain, bilateral   [place] [facility] Raina Bunting, DO   3 months ago Oral thrush   [place] [facility] [place], Rankin Buzzard, NP   4 months ago Multifocal pneumonia    [facility] [place], Kayleen Party, [place]

## 2024-02-11 ENCOUNTER — Other Ambulatory Visit: Payer: Self-pay | Admitting: Family Medicine

## 2024-02-11 DIAGNOSIS — R053 Chronic cough: Secondary | ICD-10-CM

## 2024-02-11 DIAGNOSIS — J9801 Acute bronchospasm: Secondary | ICD-10-CM

## 2024-02-11 MED ORDER — VENTOLIN HFA 108 (90 BASE) MCG/ACT IN AERS: 2.0000 | INHALATION_SPRAY | RESPIRATORY_TRACT | 3 refills | Status: AC | PRN

## 2024-02-20 ENCOUNTER — Other Ambulatory Visit: Payer: Self-pay | Admitting: Family Medicine

## 2024-02-20 DIAGNOSIS — K449 Diaphragmatic hernia without obstruction or gangrene: Secondary | ICD-10-CM | POA: Diagnosis not present

## 2024-02-20 DIAGNOSIS — K3189 Other diseases of stomach and duodenum: Secondary | ICD-10-CM | POA: Diagnosis not present

## 2024-02-20 DIAGNOSIS — K2100 Gastro-esophageal reflux disease with esophagitis, without bleeding: Secondary | ICD-10-CM | POA: Diagnosis not present

## 2024-02-20 DIAGNOSIS — E890 Postprocedural hypothyroidism: Secondary | ICD-10-CM

## 2024-02-20 DIAGNOSIS — K5730 Diverticulosis of large intestine without perforation or abscess without bleeding: Secondary | ICD-10-CM | POA: Diagnosis not present

## 2024-02-20 DIAGNOSIS — K317 Polyp of stomach and duodenum: Secondary | ICD-10-CM | POA: Diagnosis not present

## 2024-02-20 DIAGNOSIS — Z1211 Encounter for screening for malignant neoplasm of colon: Secondary | ICD-10-CM | POA: Diagnosis not present

## 2024-02-20 DIAGNOSIS — Z6841 Body Mass Index (BMI) 40.0 and over, adult: Secondary | ICD-10-CM | POA: Diagnosis not present

## 2024-02-20 DIAGNOSIS — K2950 Unspecified chronic gastritis without bleeding: Secondary | ICD-10-CM | POA: Diagnosis not present

## 2024-02-20 DIAGNOSIS — K209 Esophagitis, unspecified without bleeding: Secondary | ICD-10-CM | POA: Diagnosis not present

## 2024-02-20 DIAGNOSIS — K222 Esophageal obstruction: Secondary | ICD-10-CM | POA: Diagnosis not present

## 2024-02-20 DIAGNOSIS — K219 Gastro-esophageal reflux disease without esophagitis: Secondary | ICD-10-CM | POA: Diagnosis not present

## 2024-02-21 NOTE — Telephone Encounter (Signed)
 Requested Prescriptions  Pending Prescriptions Disp Refills   EUTHYROX  125 MCG tablet [Pharmacy Med Name: Euthyrox  125 MCG Oral Tablet] 90 tablet 0    Sig: TAKE 1 TABLET BY MOUTH ONCE DAILY BEFORE BREAKFAST     Endocrinology:  Hypothyroid Agents Passed - 02/21/2024 12:23 PM      Passed - TSH in normal range and within 360 days    TSH  Date Value Ref Range Status  01/22/2024 2.38 mIU/L Final    Comment:              Reference Range .           > or = 20 Years  0.40-4.50 .                Pregnancy Ranges           First trimester    0.26-2.66           Second trimester   0.55-2.73           Third trimester    0.43-2.91          Passed - Valid encounter within last 12 months    Recent Outpatient Visits           2 months ago Chronic hip pain, bilateral   [place] [facility] [place], Marsa PARAS, DO   3 months ago Oral thrush   [place] [facility] [place], Angeline ORN, NP   4 months ago Multifocal pneumonia   [place] [facility] [place], Marsa PARAS, [place]

## 2024-02-24 ENCOUNTER — Other Ambulatory Visit: Payer: Self-pay | Admitting: Family Medicine

## 2024-02-24 DIAGNOSIS — E890 Postprocedural hypothyroidism: Secondary | ICD-10-CM

## 2024-02-26 MED ORDER — LEVOTHYROXINE SODIUM 125 MCG PO TABS: 125.0000 ug | ORAL_TABLET | Freq: Every day | ORAL | 3 refills | Status: AC

## 2024-02-26 NOTE — Telephone Encounter (Signed)
 Requested medication (s) are due for refill today: yes  Requested medication (s) are on the active medication list: yes  Last refill:  01/2724  Future visit scheduled: yes  Notes to clinic:  Pharmacy comment: euthyrox  no longer available, please change to levothyroxine .      Requested Prescriptions  Pending Prescriptions Disp Refills   EUTHYROX  125 MCG tablet [Pharmacy Med Name: EUTHYROX  TAB] 90 tablet 0    Sig: TAKE 1 TABLET BY MOUTH ONCE DAILY BEFORE BREAKFAST     Endocrinology:  Hypothyroid Agents Passed - 02/26/2024 12:46 PM      Passed - TSH in normal range and within 360 days    TSH  Date Value Ref Range Status  01/22/2024 2.38 mIU/L Final    Comment:              Reference Range .           > or = 20 Years  0.40-4.50 .                Pregnancy Ranges           First trimester    0.26-2.66           Second trimester   0.55-2.73           Third trimester    0.43-2.91          Passed - Valid encounter within last 12 months    Recent Outpatient Visits           3 months ago Chronic hip pain, bilateral   [place] [facility] [place], Marsa PARAS, DO   4 months ago Oral thrush   [place] [facility] [place], Angeline ORN, NP   4 months ago Multifocal pneumonia   [place] [facility] [place], Marsa PARAS, [place]

## 2024-03-03 ENCOUNTER — Ambulatory Visit
Admission: RE | Admit: 2024-03-03 | Discharge: 2024-03-03 | Disposition: A | Discharge status: Home / Self Care | Attending: Surgical | Admitting: Surgical

## 2024-03-03 ENCOUNTER — Other Ambulatory Visit: Payer: Self-pay | Admitting: Surgical

## 2024-03-03 ENCOUNTER — Ambulatory Visit
Admission: RE | Admit: 2024-03-03 | Discharge: 2024-03-03 | Disposition: A | Discharge status: Home / Self Care | Source: Ambulatory Visit | Attending: Surgical | Admitting: Surgical

## 2024-03-03 DIAGNOSIS — Z6841 Body Mass Index (BMI) 40.0 and over, adult: Secondary | ICD-10-CM | POA: Diagnosis not present

## 2024-03-03 DIAGNOSIS — Z01818 Encounter for other preprocedural examination: Secondary | ICD-10-CM | POA: Insufficient documentation

## 2024-03-03 DIAGNOSIS — Z7689 Persons encountering health services in other specified circumstances: Secondary | ICD-10-CM | POA: Diagnosis not present

## 2024-03-04 ENCOUNTER — Ambulatory Visit (INDEPENDENT_AMBULATORY_CARE_PROVIDER_SITE_OTHER): Admitting: Family Medicine

## 2024-03-04 ENCOUNTER — Encounter: Payer: Self-pay | Admitting: Family Medicine

## 2024-03-04 VITALS — BP 138/88 | HR 81 | Ht 64.0 in | Wt 248.0 lb

## 2024-03-04 DIAGNOSIS — R7303 Prediabetes: Secondary | ICD-10-CM | POA: Diagnosis not present

## 2024-03-04 DIAGNOSIS — Z0000 Encounter for general adult medical examination without abnormal findings: Secondary | ICD-10-CM | POA: Diagnosis not present

## 2024-03-04 DIAGNOSIS — Z1231 Encounter for screening mammogram for malignant neoplasm of breast: Secondary | ICD-10-CM

## 2024-03-04 DIAGNOSIS — E890 Postprocedural hypothyroidism: Secondary | ICD-10-CM | POA: Diagnosis not present

## 2024-03-04 NOTE — Progress Notes (Signed)
 Subjective:    Patient ID: Morgan Stevens, female    DOB: [birth year], 58 y.o.   MRN: [id number]  Morgan Stevens is a 58 y.o. female presenting on 03/04/2024 for Annual Exam   HPI  Discussed the use of AI scribe software for clinical note transcription with the patient, who gave verbal consent to proceed.  History of Present Illness   Morgan Stevens is a 58 year old female who presents for an annual wellness physical exam.  Morbid Obesity BMI >42 Obesity and bariatric evaluation - Currently undergoing evaluation and treatment through [facility]'s bariatric program - Consulting with nutritionist and psychiatrist as part of bariatric evaluation  CHRONIC HTN: Current Meds - Amlodipine  5mg  daily Reports good compliance, took meds today. Tolerating well, w/o complaints.   HYPERLIPIDEMIA: Last labs with Lipid panel LDL 68  - Currently taking Atorvastatin  20mg , tolerating well without side effects or myalgias   Cervical Spine DDD, nerve impingement Left Shoulder Chronic Pain Has followed with Dr Clois [facility] Taking Methocarbamol  PRN S/p Cervical spine fusion 10/2022   Hypothyroidism, acquired Initially diagnosed low thyroid  in 2017 Continues on Euthyrox  125mcg daily   GERD, Gastritis / Esophagitis Hiatal Hernia On Famotidine  20mg  BID      Health Maintenance:  Breast CA Screening: Due for mammogram screening. Last mammogram result 12/2022 (negative) done at [facility]. No prior history abnormal mammogram. No known family history of breast cancer. Currently asymptomatic. Repeat 2025   Colonoscopy completed 02/20/24, next due in 10 years 2035  Cervical CA Screening last done May 2024, good for 5 years      11/27/2023    3:28 PM 10/02/2023    3:17 PM 08/27/2023    2:52 PM  Depression screen PHQ 2/9  Decreased Interest 2 2 2   Down, Depressed, Hopeless 3 2 2   PHQ - 2 Score 5 4 4   Altered sleeping 3 0 2  Tired, decreased energy 3 3 3   Change in  appetite 3 2 3   Feeling bad or failure about yourself  2 0 3  Trouble concentrating 0 0 1  Moving slowly or fidgety/restless 0 0 0  Suicidal thoughts 0 0 0  PHQ-9 Score 16 9 16   Difficult doing work/chores Very difficult Somewhat difficult        11/27/2023    3:29 PM 10/02/2023    3:17 PM 08/27/2023    2:53 PM 05/01/2023    1:39 PM  GAD 7 : Generalized Anxiety Score  Nervous, Anxious, on Edge 3 2 3 3   Control/stop worrying 3 1 3 3   Worry too much - different things 3 1 3 3   Trouble relaxing 3 1 3 2   Restless 0 0 0 0  Easily annoyed or irritable 3 2 3 3   Afraid - awful might happen 2 1 2 2   Total GAD 7 Score 17 8 17 16   Anxiety Difficulty Very difficult Not difficult at all       Past Medical History:  Diagnosis Date   Anemia    Chronic headaches    several per week   Gastritis    GERD (gastroesophageal reflux disease)    Hiatal hernia    Hyperlipidemia    Hypertension    Hypothyroidism    Obesity (BMI 30-39.9)    Pre-diabetes    Reflux    Spinal stenosis    Thyroid  disease    Vertigo    rare   Wears contact lenses    Past Surgical History:  Procedure Laterality Date   ANTERIOR CERVICAL DECOMP/DISCECTOMY FUSION N/A 11/14/2022   Procedure: C5-7 ANTERIOR CERVICAL DISCECTOMY AND FUSION (NUVASIVE ACP, GLOBUS HEDRON);  Surgeon: Clois Fret, MD;  Location: [facility] ORS;  Service: Neurosurgery;  Laterality: N/A;   CARDIAC CATHETERIZATION  02/27/2016   Procedure: Left Heart Cath and Coronary Angiography;  Surgeon: Candyce GORMAN Reek, MD;  Location: [facility] INVASIVE CV LAB;  Service: Cardiovascular;;   CESAREAN SECTION     CHOLECYSTECTOMY     COLONOSCOPY WITH PROPOFOL  N/A 04/15/2019   Procedure: COLONOSCOPY WITH PROPOFOL ;  Surgeon: Janalyn Keene NOVAK, MD;  Location: [facility] ENDOSCOPY;  Service: Endoscopy;  Laterality: N/A;   ESOPHAGOGASTRODUODENOSCOPY N/A 11/05/2022   Procedure: ESOPHAGOGASTRODUODENOSCOPY (EGD);  Surgeon: Onita Elspeth Sharper, DO;  Location: [facility] ENDOSCOPY;   Service: Gastroenterology;  Laterality: N/A;   ESOPHAGOGASTRODUODENOSCOPY (EGD) WITH PROPOFOL  N/A 04/15/2019   Procedure: ESOPHAGOGASTRODUODENOSCOPY (EGD) WITH PROPOFOL ;  Surgeon: Janalyn Keene NOVAK, MD;  Location: [facility] ENDOSCOPY;  Service: Endoscopy;  Laterality: N/A;   SHOULDER ARTHROSCOPY WITH DEBRIDEMENT AND BICEP TENDON REPAIR Left 02/06/2022   Procedure: Left shoulder arthroscopic debridement, subacromial decompression, open subpectoral biceps tenodesis, and  Regeneten Patch application;  Surgeon: Tobie Priest, MD;  Location: [facility];  Service: Orthopedics;  Laterality: Left;   THYROIDECTOMY  2018   THYROIDECTOMY     Social History   Socioeconomic History   Marital status: Married    Spouse name: Clem   Number of children: 6   Years of education: Not on file   Highest education level: Associate degree: occupational, Scientist, product/process development, or vocational program  Occupational History   Occupation: home maker  Tobacco Use   Smoking status: Never   Smokeless tobacco: Never  Vaping Use   Vaping status: Never Used  Substance and Sexual Activity   Alcohol use: No    Alcohol/week: 0.0 standard drinks of alcohol   Drug use: No   Sexual activity: Yes    Birth control/protection: None  Other Topics Concern   Not on file  Social History Narrative   Home maker, 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, husband is a window Midwife   Social Drivers of Health   Financial Resource Strain: High Risk (03/03/2024)   Overall Financial Resource Strain (CARDIA)    Difficulty of Paying Living Expenses: Hard  Food Insecurity: Food Insecurity Present (03/03/2024)   Hunger Vital Sign    Worried About Running Out of Food in the Last Year: Often true    Ran Out of Food in the Last Year: Sometimes true  Transportation Needs: Unmet Transportation Needs (03/03/2024)   PRAPARE - Administrator, Civil Service (Medical): Yes    Lack of Transportation (Non-Medical): No  Physical Activity: Inactive (03/03/2024)    Exercise Vital Sign    Days of Exercise per Week: 0 days    Minutes of Exercise per Session: Not on file  Stress: Stress Concern Present (03/03/2024)   [employer] of Occupational Health - Occupational Stress Questionnaire    Feeling of Stress: Very much  Social Connections: Moderately Integrated (03/03/2024)   Social Connection and Isolation Panel    Frequency of Communication with Friends and Family: Once a week    Frequency of Social Gatherings with Friends and Family: Once a week    Attends Religious Services: More than 4 times per year    Active Member of [employer] or Organizations: Yes    Attends Engineer, structural: More than 4 times per year    Marital Status: Married  Catering manager Violence:  Not At Risk (09/24/2023)   Humiliation, Afraid, Rape, and Kick questionnaire    Fear of Current or Ex-Partner: No    Emotionally Abused: No    Physically Abused: No    Sexually Abused: No   Family History  Problem Relation Age of Onset   Congestive Heart Failure Mother    Breast cancer Maternal Aunt        48s   Breast cancer Maternal Aunt        81s   Breast cancer Maternal Aunt        61s   Current Outpatient Medications on File Prior to Visit  Medication Sig   amLODipine  (NORVASC ) 5 MG tablet Take 1 tablet (5 mg total) by mouth daily.   aspirin  EC 81 MG tablet Take 81 mg by mouth at bedtime. Swallow whole.   atorvastatin  (LIPITOR) 20 MG tablet TAKE 1 TABLET BY MOUTH AT BEDTIME   Calcium  Carbonate-Simethicone  (TUMS GAS RELIEF CHEWY BITES PO) Take by mouth as needed.   esomeprazole  (NEXIUM ) 40 MG capsule Take 1 capsule (40 mg total) by mouth 2 (two) times daily before a meal.   famotidine  (PEPCID ) 20 MG tablet Take 1 tablet (20 mg total) by mouth 2 (two) times daily.   gabapentin  (NEURONTIN ) 300 MG capsule Take 1 capsule (300 mg total) by mouth 3 (three) times daily.   levothyroxine  (SYNTHROID ) 125 MCG tablet Take 1 tablet (125 mcg total) by mouth daily.    methocarbamol  (ROBAXIN ) 500 MG tablet Take 1 tablet (500 mg total) by mouth every 8 (eight) hours as needed for muscle spasms.   montelukast  (SINGULAIR ) 10 MG tablet TAKE 1 TABLET BY MOUTH AT BEDTIME   VENTOLIN  HFA 108 (90 Base) MCG/ACT inhaler Inhale 2 puffs into the lungs every 4 (four) hours as needed for wheezing or shortness of breath.   No current facility-administered medications on file prior to visit.    Review of Systems  Constitutional:  Negative for activity change, appetite change, chills, diaphoresis, fatigue and fever.  HENT:  Negative for congestion and hearing loss.   Eyes:  Negative for visual disturbance.  Respiratory:  Negative for cough, chest tightness, shortness of breath and wheezing.   Cardiovascular:  Negative for chest pain, palpitations and leg swelling.  Gastrointestinal:  Negative for abdominal pain, constipation, diarrhea, nausea and vomiting.  Genitourinary:  Negative for dysuria, frequency and hematuria.  Musculoskeletal:  Negative for arthralgias and neck pain.  Skin:  Negative for rash.  Neurological:  Negative for dizziness, weakness, light-headedness, numbness and headaches.  Hematological:  Negative for adenopathy.  Psychiatric/Behavioral:  Negative for behavioral problems, dysphoric mood and sleep disturbance.    Per HPI unless specifically indicated above     Objective:    BP 138/88 (BP Location: Right Arm, Patient Position: Sitting, Cuff Size: Large)   Pulse 81   Ht 5' 4 (1.626 m)   Wt 248 lb (112.5 kg)   LMP 07/11/2020   SpO2 98%   BMI 42.57 kg/m   Wt Readings from Last 3 Encounters:  03/04/24 248 lb (112.5 kg)  12/13/23 233 lb (105.7 kg)  11/27/23 242 lb (109.8 kg)    Physical Exam Vitals and nursing note reviewed.  Constitutional:      General: She is not in acute distress.    Appearance: She is well-developed. She is obese. She is not diaphoretic.     Comments: Well-appearing, comfortable, cooperative  HENT:     Head:  Normocephalic and atraumatic.  Eyes:  General:        Right eye: No discharge.        Left eye: No discharge.     Conjunctiva/sclera: Conjunctivae normal.     Pupils: Pupils are equal, round, and reactive to light.  Neck:     Thyroid : No thyromegaly.     Vascular: No carotid bruit.  Cardiovascular:     Rate and Rhythm: Normal rate and regular rhythm.     Pulses: Normal pulses.     Heart sounds: Normal heart sounds. No murmur heard. Pulmonary:     Effort: Pulmonary effort is normal. No respiratory distress.     Breath sounds: Normal breath sounds. No wheezing or rales.  Abdominal:     General: Bowel sounds are normal. There is no distension.     Palpations: Abdomen is soft. There is no mass.     Tenderness: There is no abdominal tenderness.  Musculoskeletal:        General: No tenderness. Normal range of motion.     Cervical back: Normal range of motion and neck supple.     Right lower leg: No edema.     Left lower leg: No edema.     Comments: Upper / Lower Extremities: - Normal muscle tone, strength bilateral upper extremities 5/5, lower extremities 5/5  Lymphadenopathy:     Cervical: No cervical adenopathy.  Skin:    General: Skin is warm and dry.     Findings: No erythema or rash.  Neurological:     Mental Status: She is alert and oriented to person, place, and time.     Comments: Distal sensation intact to light touch all extremities  Psychiatric:        Mood and Affect: Mood normal.        Behavior: Behavior normal.        Thought Content: Thought content normal.     Comments: Well groomed, good eye contact, normal speech and thoughts     Results for orders placed or performed in visit on 01/22/24  T4, free   Collection Time: 01/22/24 10:57 AM  Result Value Ref Range   Free T4 1.3 0.8 - 1.8 ng/dL  TSH   Collection Time: 01/22/24 10:57 AM  Result Value Ref Range   TSH 2.38 mIU/L  COMPLETE METABOLIC PANEL WITH GFR   Collection Time: 01/22/24 10:57 AM   Result Value Ref Range   Glucose, Bld 139 (H) 65 - 99 mg/dL   BUN 12 7 - 25 mg/dL   Creat 9.24 9.49 - 8.96 mg/dL   BUN/Creatinine Ratio SEE NOTE: 6 - 22 (calc)   Sodium 141 135 - 146 mmol/L   Potassium 4.0 3.5 - 5.3 mmol/L   Chloride 103 98 - 110 mmol/L   CO2 32 20 - 32 mmol/L   Calcium  9.3 8.6 - 10.4 mg/dL   Total Protein 7.0 6.1 - 8.1 g/dL   Albumin 4.3 3.6 - 5.1 g/dL   Globulin 2.7 1.9 - 3.7 g/dL (calc)   AG Ratio 1.6 1.0 - 2.5 (calc)   Total Bilirubin 0.7 0.2 - 1.2 mg/dL   Alkaline phosphatase (APISO) 61 37 - 153 U/L   AST 23 10 - 35 U/L   ALT 43 (H) 6 - 29 U/L  Hemoglobin A1c   Collection Time: 01/22/24 10:57 AM  Result Value Ref Range   Hgb A1c MFr Bld 6.2 (H) <5.7 %   Mean Plasma Glucose 131 mg/dL   eAG (mmol/L) 7.3 mmol/L  CBC with Differential/Platelet  Collection Time: 01/22/24 10:57 AM  Result Value Ref Range   WBC 5.7 3.8 - 10.8 Thousand/uL   RBC 4.42 3.80 - 5.10 Million/uL   Hemoglobin 13.3 11.7 - 15.5 g/dL   HCT 60.4 64.9 - 54.9 %   MCV 89.4 80.0 - 100.0 fL   MCH 30.1 27.0 - 33.0 pg   MCHC 33.7 32.0 - 36.0 g/dL   RDW 86.8 88.9 - 84.9 %   Platelets 354 140 - 400 Thousand/uL   MPV 10.5 7.5 - 12.5 fL   Neutro Abs 2,252 1,500 - 7,800 cells/uL   Absolute Lymphocytes 2,782 850 - 3,900 cells/uL   Absolute Monocytes 399 200 - 950 cells/uL   Eosinophils Absolute 188 15 - 500 cells/uL   Basophils Absolute 80 0 - 200 cells/uL   Neutrophils Relative % 39.5 %   Total Lymphocyte 48.8 %   Monocytes Relative 7.0 %   Eosinophils Relative 3.3 %   Basophils Relative 1.4 %  Lipid panel   Collection Time: 01/22/24 10:57 AM  Result Value Ref Range   Cholesterol 128 <200 mg/dL   HDL 42 (L) > OR = 50 mg/dL   Triglycerides 95 <849 mg/dL   LDL Cholesterol (Calc) 68 mg/dL (calc)   Total CHOL/HDL Ratio 3.0 <5.0 (calc)   Non-HDL Cholesterol (Calc) 86 <869 mg/dL (calc)      Assessment & Plan:   Problem List Items Addressed This Visit     Morbid obesity (HCC)    Postoperative hypothyroidism   Other Visit Diagnoses       Annual physical exam    -  Primary     Encounter for screening mammogram for malignant neoplasm of breast       Relevant Orders   MM 3D SCREENING MAMMOGRAM BILATERAL BREAST     Pre-diabetes            Updated Health Maintenance information Reviewed recent lab results with patient Encouraged improvement to lifestyle with diet and exercise   Morbid Obesity Undergoing evaluation for bariatric surgery at [facility]. Decision between gastric bypass and sleeve gastrectomy pending reflux severity. Completed most pre-surgical evaluations. - Pending Sleep Study - Proceed with bariatric surgery evaluation and decision-making process. - Anticipating potential bariatric surgery based on their recommendations once intake testing done  Prediabetes A1c increased to 6.2, indicating prediabetic range. Bariatric surgery anticipated to positively impact blood glucose control. - Monitor blood glucose levels.  Gastroesophageal Reflux Disease (GERD) Significant acid reflux and heartburn experienced. Reflux severity being evaluated to decide between gastric bypass and sleeve gastrectomy. - Evaluate reflux severity through endoscopy results.  Vitamin D Deficiency Recent lab results indicate low vitamin D levels.  Wellness Physical Routine wellness physical examination. No major issues identified. - Perform routine wellness physical examination.  General Health Maintenance Up to date on most screenings and vaccinations. Mammogram is due. - Order mammogram.  Follow-up Advised to follow up after bariatric surgery evaluation and treatment plan implementation. - Follow up in one year or sooner as needed. - Coordinate with [facility] for updates on bariatric surgery plan.          Orders Placed This Encounter  Procedures   MM 3D SCREENING MAMMOGRAM BILATERAL BREAST    Standing Status:   Future    Expiration Date:   03/04/2025    Reason for Exam  (SYMPTOM  OR DIAGNOSIS REQUIRED):   Screening bilateral 3D Mammogram Tomo    Preferred imaging location?:   [facility]    No orders of the defined  types were placed in this encounter.    Follow up plan: Return in about 1 year (around 03/04/2025), or if symptoms worsen or fail to improve.  Marsa Officer, DO [facility] 03/04/2024, 2:55 PM

## 2024-03-04 NOTE — Patient Instructions (Addendum)
 Thank you for coming to the office today.  Keep in touch with us  to update after the Bariatric consultations are finished and you proceed to a treatment plan  For Mammogram screening for breast cancer   Call the Imaging Center below anytime to schedule your own appointment now that order has been placed.  [facility] at [facility] [address], [address] [place], [place] [address] Phone: [phone]  Please schedule a Follow-up Appointment to: Return in about 1 year (around 03/04/2025), or if symptoms worsen or fail to improve.  If you have any other questions or concerns, please feel free to call the office or send a message through MyChart. You may also schedule an earlier appointment if necessary.  Additionally, you may be receiving a survey about your experience at our office within a few days to 1 week by e-mail or mail. We value your feedback.  Marsa Officer, DO [facility], [place]

## 2024-03-20 ENCOUNTER — Other Ambulatory Visit: Payer: Self-pay | Admitting: Physician Assistant

## 2024-03-20 DIAGNOSIS — K2100 Gastro-esophageal reflux disease with esophagitis, without bleeding: Secondary | ICD-10-CM

## 2024-03-24 ENCOUNTER — Telehealth: Payer: Self-pay | Admitting: *Deleted

## 2024-03-24 NOTE — Progress Notes (Unsigned)
 Complex Care Management Note Care Guide Note  03/24/2024 Name: Fatou Dunnigan MRN: [id number] DOB: [birth year]   Complex Care Management Outreach Attempts: An unsuccessful telephone outreach was attempted today to offer the patient information about available complex care management services.  Follow Up Plan:  Additional outreach attempts will be made to offer the patient complex care management information and services.   Encounter Outcome:  No Answer  Harlene Satterfield  [facility]  [facility], [facility] Guide  Direct Dial: [phone]  Fax [phone]

## 2024-03-25 NOTE — Progress Notes (Signed)
 Complex Care Management Note  Care Guide Note 03/25/2024 Name: Morgan Stevens MRN: [id number] DOB: 1965/12/04  Morgan Stevens is a 58 y.o. year old female who sees Edman Marsa PARAS, DO for primary care. I reached out to Levorn Louder by phone today to offer complex care management services.  Morgan Stevens was given information about Complex Care Management services today including:   The Complex Care Management services include support from the care team which includes your Nurse Care Manager, Clinical Social Worker, or Pharmacist.  The Complex Care Management team is here to help remove barriers to the health concerns and goals most important to you. Complex Care Management services are voluntary, and the patient may decline or stop services at any time by request to their care team member.   Complex Care Management Consent Status: Patient agreed to services and verbal consent obtained.   Follow up plan:  Telephone appointment with complex care management team member scheduled for:  8/18  Encounter Outcome:  Patient Scheduled  Morgan Stevens  [facility]  [facility], [facility] Guide  Direct Dial: [phone]  Fax [phone]

## 2024-04-03 ENCOUNTER — Ambulatory Visit: Admitting: Family Medicine

## 2024-04-09 ENCOUNTER — Encounter

## 2024-04-10 NOTE — Progress Notes (Signed)
 Sleep Medicine   Office Visit  Patient Name: Morgan Stevens DOB: 05-16-1966 MRN [id number]    Chief Complaint: sleep evaluation   Brief History:  [name] presents for an initial consult for sleep evaluation and to establish care. Patient has a 2 month history of daytime sleepiness. Sleep quality is poor. This is noted every night. The patient's bed partner reports snoring, gasping and witnessed apneic pauses at night. The patient relates the following symptoms: headaches, fatigue, trouble concentrating, brain fog, and some memory issues are also present. The patient goes to sleep at 1130 pm and wakes up at 1000 am and wakes up at least 2-3 times in between with occasional trouble returning to sleep.  Sleep quality is worse when outside home environment.  Patient has noted no significant movement of her legs at night that would disrupt her sleep.  The patient  relates sleep talking, sleep screaming, nightmares and vivid dreams as unusual behavior during the night.  The patient relates  anxiety as a history of psychiatric problems. The Epworth Sleepiness Score is 7 out of 24 .  The patient relates  Cardiovascular risk factors include: HTN. The patient reports interest in bariatric surgery.    ROS  General: (-) fever, (-) chills, (-) night sweat Nose and Sinuses: (-) nasal stuffiness or itchiness, (-) postnasal drip, (-) nosebleeds, (-) sinus trouble. Mouth and Throat: (-) sore throat, (-) hoarseness. Neck: (-) swollen glands, (-) enlarged thyroid , (-) neck pain. Respiratory: - cough, - shortness of breath, - wheezing. Neurologic: - numbness, - tingling. Psychiatric: + anxiety, - depression Sleep behavior: -sleep paralysis -hypnogogic hallucinations -dream enactment      -vivid dreams -cataplexy -night terrors -sleep walking   Current Medication: Outpatient Encounter Medications as of 04/13/2024  Medication Sig Note   amLODipine  (NORVASC ) 5 MG tablet Take 1 tablet (5 mg total) by mouth daily.     aspirin  EC 81 MG tablet Take 81 mg by mouth at bedtime. Swallow whole.    atorvastatin  (LIPITOR) 20 MG tablet TAKE 1 TABLET BY MOUTH AT BEDTIME    Calcium  Carbonate-Simethicone  (TUMS GAS RELIEF CHEWY BITES PO) Take by mouth as needed. 09/23/2023: prn   esomeprazole  (NEXIUM ) 40 MG capsule TAKE 1 CAPSULE BY MOUTH TWICE DAILY BEFORE A MEAL    famotidine  (PEPCID ) 20 MG tablet Take 1 tablet (20 mg total) by mouth 2 (two) times daily.    gabapentin  (NEURONTIN ) 300 MG capsule Take 1 capsule (300 mg total) by mouth 3 (three) times daily.    levothyroxine  (SYNTHROID ) 125 MCG tablet Take 1 tablet (125 mcg total) by mouth daily.    methocarbamol  (ROBAXIN ) 500 MG tablet Take 1 tablet (500 mg total) by mouth every 8 (eight) hours as needed for muscle spasms.    montelukast  (SINGULAIR ) 10 MG tablet TAKE 1 TABLET BY MOUTH AT BEDTIME    VENTOLIN  HFA 108 (90 Base) MCG/ACT inhaler Inhale 2 puffs into the lungs every 4 (four) hours as needed for wheezing or shortness of breath.    No facility-administered encounter medications on file as of 04/13/2024.    Surgical History: Past Surgical History:  Procedure Laterality Date   ANTERIOR CERVICAL DECOMP/DISCECTOMY FUSION N/A 11/14/2022   Procedure: C5-7 ANTERIOR CERVICAL DISCECTOMY AND FUSION (NUVASIVE ACP, GLOBUS HEDRON);  Surgeon: Clois Fret, MD;  Location: [facility] ORS;  Service: Neurosurgery;  Laterality: N/A;   CARDIAC CATHETERIZATION  02/27/2016   Procedure: Left Heart Cath and Coronary Angiography;  Surgeon: Candyce GORMAN Reek, MD;  Location: [facility] INVASIVE CV LAB;  Service: Cardiovascular;;   CESAREAN SECTION     CHOLECYSTECTOMY     COLONOSCOPY WITH PROPOFOL N/A 04/15/2019   Procedure: COLONOSCOPY WITH PROPOFOL;  Surgeon: Janalyn Keene NOVAK, MD;  Location: [facility] ENDOSCOPY;  Service: Endoscopy;  Laterality: N/A;   ESOPHAGOGASTRODUODENOSCOPY N/A 11/05/2022   Procedure: ESOPHAGOGASTRODUODENOSCOPY (EGD);  Surgeon: Onita Elspeth Sharper, DO;  Location: [facility]  ENDOSCOPY;  Service: Gastroenterology;  Laterality: N/A;   ESOPHAGOGASTRODUODENOSCOPY (EGD) WITH PROPOFOL N/A 04/15/2019   Procedure: ESOPHAGOGASTRODUODENOSCOPY (EGD) WITH PROPOFOL;  Surgeon: Janalyn Keene NOVAK, MD;  Location: [facility] ENDOSCOPY;  Service: Endoscopy;  Laterality: N/A;   SHOULDER ARTHROSCOPY WITH DEBRIDEMENT AND BICEP TENDON REPAIR Left 02/06/2022   Procedure: Left shoulder arthroscopic debridement, subacromial decompression, open subpectoral biceps tenodesis, and  Regeneten Patch application;  Surgeon: Tobie Priest, MD;  Location: [facility];  Service: Orthopedics;  Laterality: Left;   THYROIDECTOMY  2018   THYROIDECTOMY      Medical History: Past Medical History:  Diagnosis Date   Anemia    Chronic headaches    several per week   Gastritis    GERD (gastroesophageal reflux disease)    Hiatal hernia    Hyperlipidemia    Hypertension    Hypothyroidism    Obesity (BMI 30-39.9)    Pre-diabetes    Reflux    Spinal stenosis    Thyroid disease    Vertigo    rare   Wears contact lenses     Family History: Non contributory to the present illness  Social History: Social History   Socioeconomic History   Marital status: Married    Spouse name: Clem   Number of children: 6   Years of education: Not on file   Highest education level: Associate degree: occupational, Scientist, product/process development, or vocational program  Occupational History   Occupation: home maker  Tobacco Use   Smoking status: Never   Smokeless tobacco: Never  Vaping Use   Vaping status: Never Used  Substance and Sexual Activity   Alcohol use: No    Alcohol/week: 0.0 standard drinks of alcohol   Drug use: No   Sexual activity: Yes    Birth control/protection: None  Other Topics Concern   Not on file  Social History Narrative   Home maker, 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, husband is a window Midwife   Social Drivers of Health   Financial Resource Strain: High Risk (03/03/2024)   Overall Financial Resource  Strain (CARDIA)    Difficulty of Paying Living Expenses: Hard  Food Insecurity: Food Insecurity Present (03/03/2024)   Hunger Vital Sign    Worried About Running Out of Food in the Last Year: Often true    Ran Out of Food in the Last Year: Sometimes true  Transportation Needs: Unmet Transportation Needs (03/03/2024)   PRAPARE - Administrator, Civil Service (Medical): Yes    Lack of Transportation (Non-Medical): No  Physical Activity: Inactive (03/03/2024)   Exercise Vital Sign    Days of Exercise per Week: 0 days    Minutes of Exercise per Session: Not on file  Stress: Stress Concern Present (03/03/2024)   [employer] of Occupational Health - Occupational Stress Questionnaire    Feeling of Stress: Very much  Social Connections: Moderately Integrated (03/03/2024)   Social Connection and Isolation Panel    Frequency of Communication with Friends and Family: Once a week    Frequency of Social Gatherings with Friends and Family: Once a week    Attends Religious Services: More than 4 times per  year    Active Member of Clubs or Organizations: Yes    Attends Banker Meetings: More than 4 times per year    Marital Status: Married  Catering manager Violence: Not At Risk (09/24/2023)   Humiliation, Afraid, Rape, and Kick questionnaire    Fear of Current or Ex-Partner: No    Emotionally Abused: No    Physically Abused: No    Sexually Abused: No    Vital Signs: Last menstrual period 07/11/2020. There is no height or weight on file to calculate BMI.   Examination: General Appearance: The patient is well-developed, well-nourished, and in no distress. Neck Circumference: 46 cm Skin: Gross inspection of skin unremarkable. Head: normocephalic, no gross deformities. Eyes: no gross deformities noted. ENT: ears appear grossly normal Neurologic: Alert and oriented. No involuntary movements.    STOP BANG RISK ASSESSMENT S (snore) Have you been told that you snore?      YES   T (tired) Are you often tired, fatigued, or sleepy during the day?   YES  O (obstruction) Do you stop breathing, choke, or gasp during sleep? YES   P (pressure) Do you have or are you being treated for high blood pressure? YES   B (BMI) Is your body index greater than 35 kg/m? YES   A (age) Are you 9 years old or older? YES   N (neck) Do you have a neck circumference greater than 16 inches?   YES   G (gender) Are you a female? NO   TOTAL STOP/BANG "YES" ANSWERS 7                                                               A STOP-Bang score of 2 or less is considered low risk, and a score of 5 or more is high risk for having either moderate or severe OSA. For people who score 3 or 4, doctors may need to perform further assessment to determine how likely they are to have OSA.         EPWORTH SLEEPINESS SCALE:  Scale:  (0)= no chance of dozing; (1)= slight chance of dozing; (2)= moderate chance of dozing; (3)= high chance of dozing  Chance  Situtation    Sitting and reading: 2    Watching TV: 1    Sitting Inactive in public: 0    As a passenger in car: 2      Lying down to rest: 2    Sitting and talking: 0    Sitting quielty after lunch: 0    In a car, stopped in traffic: 0   TOTAL SCORE:   7 out of 24    SLEEP STUDIES:  None   LABS: Recent Results (from the past 2160 hours)  T4, free     Status: None   Collection Time: 01/22/24 10:57 AM  Result Value Ref Range   Free T4 1.3 0.8 - 1.8 ng/dL  TSH     Status: None   Collection Time: 01/22/24 10:57 AM  Result Value Ref Range   TSH 2.38 mIU/L    Comment:           Reference Range .           > or = 20 Years  0.40-4.50 .  Pregnancy Ranges           First trimester    0.26-2.66           Second trimester   0.55-2.73           Third trimester    0.43-2.91   COMPLETE METABOLIC PANEL WITH GFR     Status: Abnormal   Collection Time: 01/22/24 10:57 AM  Result Value Ref Range    Glucose, Bld 139 (H) 65 - 99 mg/dL    Comment: .            Fasting reference interval . For someone without known diabetes, a glucose value >125 mg/dL indicates that they may have diabetes and this should be confirmed with a follow-up test. .    BUN 12 7 - 25 mg/dL   Creat 9.24 9.49 - 8.96 mg/dL   BUN/Creatinine Ratio SEE NOTE: 6 - 22 (calc)    Comment:    Not Reported: BUN and Creatinine are within    reference range. .    Sodium 141 135 - 146 mmol/L   Potassium 4.0 3.5 - 5.3 mmol/L   Chloride 103 98 - 110 mmol/L   CO2 32 20 - 32 mmol/L   Calcium 9.3 8.6 - 10.4 mg/dL   Total Protein 7.0 6.1 - 8.1 g/dL   Albumin 4.3 3.6 - 5.1 g/dL   Globulin 2.7 1.9 - 3.7 g/dL (calc)   AG Ratio 1.6 1.0 - 2.5 (calc)   Total Bilirubin 0.7 0.2 - 1.2 mg/dL   Alkaline phosphatase (APISO) 61 37 - 153 U/L   AST 23 10 - 35 U/L   ALT 43 (H) 6 - 29 U/L  Hemoglobin A1c     Status: Abnormal   Collection Time: 01/22/24 10:57 AM  Result Value Ref Range   Hgb A1c MFr Bld 6.2 (H) <5.7 %    Comment: For someone without known diabetes, a hemoglobin  A1c value between 5.7% and 6.4% is consistent with prediabetes and should be confirmed with a  follow-up test. . For someone with known diabetes, a value <7% indicates that their diabetes is well controlled. A1c targets should be individualized based on duration of diabetes, age, comorbid conditions, and other considerations. . This assay result is consistent with an increased risk of diabetes. . Currently, no consensus exists regarding use of hemoglobin A1c for diagnosis of diabetes for children. .    Mean Plasma Glucose 131 mg/dL   eAG (mmol/L) 7.3 mmol/L  CBC with Differential/Platelet     Status: None   Collection Time: 01/22/24 10:57 AM  Result Value Ref Range   WBC 5.7 3.8 - 10.8 Thousand/uL   RBC 4.42 3.80 - 5.10 Million/uL   Hemoglobin 13.3 11.7 - 15.5 g/dL   HCT 60.4 64.9 - 54.9 %   MCV 89.4 80.0 - 100.0 fL   MCH 30.1 27.0 - 33.0 pg    MCHC 33.7 32.0 - 36.0 g/dL    Comment: For adults, a slight decrease in the calculated MCHC value (in the range of 30 to 32 g/dL) is most likely not clinically significant; however, it should be interpreted with caution in correlation with other red cell parameters and the patient's clinical condition.    RDW 13.1 11.0 - 15.0 %   Platelets 354 140 - 400 Thousand/uL   MPV 10.5 7.5 - 12.5 fL   Neutro Abs 2,252 1,500 - 7,800 cells/uL   Absolute Lymphocytes 2,782 850 - 3,900 cells/uL   Absolute Monocytes  399 200 - 950 cells/uL   Eosinophils Absolute 188 15 - 500 cells/uL   Basophils Absolute 80 0 - 200 cells/uL   Neutrophils Relative % 39.5 %   Total Lymphocyte 48.8 %   Monocytes Relative 7.0 %   Eosinophils Relative 3.3 %   Basophils Relative 1.4 %  Lipid panel     Status: Abnormal   Collection Time: 01/22/24 10:57 AM  Result Value Ref Range   Cholesterol 128 <200 mg/dL   HDL 42 (L) > OR = 50 mg/dL   Triglycerides 95 <849 mg/dL   LDL Cholesterol (Calc) 68 mg/dL (calc)    Comment: Reference range: <100 . Desirable range <100 mg/dL for primary prevention;   <70 mg/dL for patients with CHD or diabetic patients  with > or = 2 CHD risk factors. SABRA LDL-C is now calculated using the Martin-Hopkins  calculation, which is a validated novel method providing  better accuracy than the Friedewald equation in the  estimation of LDL-C.  Gladis APPLETHWAITE et al. SANDREA. 7986;689(80): 2061-2068  (http://education.[facility].com/faq/FAQ164)    Total CHOL/HDL Ratio 3.0 <5.0 (calc)   Non-HDL Cholesterol (Calc) 86 <869 mg/dL (calc)    Comment: For patients with diabetes plus 1 major ASCVD risk  factor, treating to a non-HDL-C goal of <100 mg/dL  (LDL-C of <29 mg/dL) is considered a therapeutic  option.     Radiology: DG Chest 2 View Result Date: 03/03/2024 CLINICAL DATA:  Morbid obesity. Preop clearance for gastric bypass. Patient reports having a Bravo placed a week ago. EXAM: CHEST - 2  VIEW COMPARISON:  Chest radiograph 10/23/2023 FINDINGS: Small radiopaque device in the expected location of the distal esophagus, approximately 4.5 cm from the diaphragm.The cardiomediastinal contours are normal. The lungs are clear. Pulmonary vasculature is normal. No consolidation, pleural effusion, or pneumothorax. No acute osseous abnormalities are seen. IMPRESSION: 1. Small radiopaque device in the expected location of the distal esophagus, may represent retained Bravo device. 2. Otherwise unremarkable radiographic appearance of the chest. Electronically Signed   By: Andrea Gasman M.D.   On: 03/03/2024 16:39    No results found.  No results found.    Assessment and Plan: Patient Active Problem List   Diagnosis Date Noted   Essential hypertension 09/26/2023   Multifocal pneumonia 09/23/2023   Family history of breast cancer 12/27/2022   Myelopathy (HCC) 11/14/2022   Cervical spinal stenosis 11/14/2022   Allergic rhinitis due to allergen 05/16/2021   Stricture and stenosis of esophagus    Columnar epithelial-lined lower esophagus    Gastric polyp    Esophageal dysphagia    Diverticulosis of large intestine without diverticulitis    Conversion disorder 06/11/2018   Esophagitis, [place] grade A 09/06/2017   Hiatal hernia 09/06/2017   Schatzki's ring of distal esophagus 09/06/2017   Morbid obesity (HCC)    Postoperative hypothyroidism 02/25/2016   GERD (gastroesophageal reflux disease) 01/21/2014   History of gestational diabetes 01/21/2014   Tonsillar hypertrophy 01/21/2014   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   1. Witnessed episode of apnea (Primary) Patient evaluation suggests high risk of sleep disordered breathing due to witnessed apnea, snoring, gasping, frequent awakening, headaches, brain fog, daytime sleepiness.  Patient has comorbid cardiovascular risk factors including: hypertension which could be exacerbated by pathologic sleep-disordered  breathing.  Suggest: psg to assess/treat the patient's sleep disordered breathing. The patient was also counselled on weight loss to optimize sleep health.   2. Morbid obesity (HCC) Obesity Counseling: Had a lengthy discussion regarding patients BMI and  weight issues. Patient was instructed on portion control as well as increased activity. Also discussed caloric restrictions with trying to maintain intake less than 2000 Kcal. Discussions were made in accordance with the 5As of weight management. Simple actions such as not eating late and if able to, taking a walk is suggested.   3. Essential hypertension Controlled with amlodipine, continue.      General Counseling: I have discussed the findings of the evaluation and examination with Levorn.  I have also discussed any further diagnostic evaluation thatmay be needed or ordered today. Karmyn verbalizes understanding of the findings of todays visit. We also reviewed her medications today and discussed drug interactions and side effects including but not limited excessive drowsiness and altered mental states. We also discussed that there is always a risk not just to her but also people around her. she has been encouraged to call the office with any questions or concerns that should arise related to todays visit.  No orders of the defined types were placed in this encounter.       I have personally obtained a history, evaluated the patient, evaluated pertinent data, formulated the assessment and plan and placed orders. This patient was seen today by Lauraine Lay, PA-C in collaboration with Dr. Elfreda Bathe.     Elfreda DELENA Bathe, MD [facility] Diplomate ABMS Pulmonary and Critical Care Medicine Sleep medicine

## 2024-04-13 ENCOUNTER — Telehealth: Payer: Self-pay

## 2024-04-13 ENCOUNTER — Emergency Department
Admission: EM | Admit: 2024-04-13 | Discharge: 2024-04-13 | Discharge status: Against Medical Advice | Attending: Emergency Medicine | Admitting: Emergency Medicine

## 2024-04-13 ENCOUNTER — Encounter: Payer: Self-pay | Admitting: Emergency Medicine

## 2024-04-13 ENCOUNTER — Emergency Department

## 2024-04-13 ENCOUNTER — Other Ambulatory Visit: Payer: Self-pay

## 2024-04-13 ENCOUNTER — Ambulatory Visit (INDEPENDENT_AMBULATORY_CARE_PROVIDER_SITE_OTHER): Payer: Self-pay | Admitting: Internal Medicine

## 2024-04-13 VITALS — BP 136/88 | HR 78 | Resp 16 | Ht 64.0 in | Wt 245.0 lb

## 2024-04-13 DIAGNOSIS — R079 Chest pain, unspecified: Secondary | ICD-10-CM | POA: Diagnosis not present

## 2024-04-13 DIAGNOSIS — I10 Essential (primary) hypertension: Secondary | ICD-10-CM

## 2024-04-13 DIAGNOSIS — Z5321 Procedure and treatment not carried out due to patient leaving prior to being seen by health care provider: Secondary | ICD-10-CM | POA: Insufficient documentation

## 2024-04-13 DIAGNOSIS — R0681 Apnea, not elsewhere classified: Secondary | ICD-10-CM | POA: Diagnosis not present

## 2024-04-13 DIAGNOSIS — R0789 Other chest pain: Secondary | ICD-10-CM | POA: Diagnosis present

## 2024-04-13 DIAGNOSIS — M62838 Other muscle spasm: Secondary | ICD-10-CM | POA: Diagnosis not present

## 2024-04-13 LAB — BASIC METABOLIC PANEL WITH GFR
Anion gap: 13 (ref 5–15)
BUN: 12 mg/dL (ref 6–20)
CO2: 24 mmol/L (ref 22–32)
Calcium: 8.9 mg/dL (ref 8.9–10.3)
Chloride: 104 mmol/L (ref 98–111)
Creatinine, Ser: 0.76 mg/dL (ref 0.44–1.00)
GFR, Estimated: >60 mL/min (ref >60)
Glucose, Bld: 165 mg/dL — ABNORMAL HIGH (ref 70–99)
Potassium: 3.2 mmol/L — ABNORMAL LOW (ref 3.5–5.1)
Sodium: 141 mmol/L (ref 135–145)

## 2024-04-13 LAB — CBC
HCT: 36.4 % (ref 36.0–46.0)
Hemoglobin: 12.9 g/dL (ref 12.0–15.0)
MCH: 30.5 pg (ref 26.0–34.0)
MCHC: 35.4 g/dL (ref 30.0–36.0)
MCV: 86.1 fL (ref 80.0–100.0)
Platelets: 279 K/uL (ref 150–400)
RBC: 4.23 MIL/uL (ref 3.87–5.11)
RDW: 13.2 % (ref 11.5–15.5)
WBC: 6 K/uL (ref 4.0–10.5)
nRBC: 0 % (ref 0.0–0.2)

## 2024-04-13 LAB — TROPONIN I (HIGH SENSITIVITY): Troponin I (High Sensitivity): 3 ng/L (ref <18)

## 2024-04-13 NOTE — ED Triage Notes (Signed)
 Patient BIB [facility] c/o right side chest pain, described as muscle spasm.  Patient denies sob, n/v. Patient reports pain comes and goes.

## 2024-04-13 NOTE — ED Triage Notes (Signed)
 Pt to ED via [facility] with c/o Chest Pain that woke up out of sleep. Per EMS pt c/o muscle spasm sensation to R side of chest.  7/10 NSR on 12 lead 133/75 80HR 98% 18G L AC 324 ASA 1 Nitro

## 2024-04-15 ENCOUNTER — Ambulatory Visit: Admitting: Family Medicine

## 2024-04-16 ENCOUNTER — Encounter: Payer: Self-pay | Admitting: Cardiology

## 2024-04-16 ENCOUNTER — Ambulatory Visit: Attending: Cardiology | Admitting: Cardiology

## 2024-04-16 VITALS — BP 110/84 | HR 78 | Ht 64.0 in | Wt 246.0 lb

## 2024-04-16 DIAGNOSIS — Z01810 Encounter for preprocedural cardiovascular examination: Secondary | ICD-10-CM | POA: Insufficient documentation

## 2024-04-16 DIAGNOSIS — R072 Precordial pain: Secondary | ICD-10-CM | POA: Diagnosis not present

## 2024-04-16 DIAGNOSIS — R0609 Other forms of dyspnea: Secondary | ICD-10-CM | POA: Diagnosis not present

## 2024-04-16 DIAGNOSIS — E782 Mixed hyperlipidemia: Secondary | ICD-10-CM | POA: Diagnosis not present

## 2024-04-16 DIAGNOSIS — E876 Hypokalemia: Secondary | ICD-10-CM | POA: Insufficient documentation

## 2024-04-16 DIAGNOSIS — I10 Essential (primary) hypertension: Secondary | ICD-10-CM | POA: Diagnosis not present

## 2024-04-16 MED ORDER — METOPROLOL TARTRATE 100 MG PO TABS: 100.0000 mg | ORAL_TABLET | Freq: Once | ORAL | 0 refills | Status: AC

## 2024-04-16 NOTE — Progress Notes (Signed)
 Cardiology Office Note   Date:  04/16/2024  ID:  Morgan Stevens, DOB [birth year], MRN [id number] PCP: Edman Marsa PARAS, DO  [facility] Providers Cardiologist:  Redell Cave, MD     History of Present Illness Morgan Stevens is a 58 y.o. female with a past medical history of hypertension, hyperlipidemia, vertigo, atypical chest pain, hypothyroidism, GERD, hiatal hernia, and obesity, who is here today for follow-up and require a preprocedure cardiovascular examination.   Prior left heart catheterization completed 06/2016 showed LV SF was normal, no angiogram completed.  Coronary artery disease with normal LVEDP recommendation was to continue with aggressive preventative care.  Coronary CTA completed in April 2021 found a coronary calcium  score of 0.  Echocardiogram completed 01/05/2020 revealing LVEF 55 to 60%, no RWMA, mild left ventricular hypertrophy, G1 DD, and mild MR.  She had several emergency department visits in 2023 primary symptoms of atypical chest pain, shortness of breath, weakness or back pain.  Workups have been unrevealing she was discharged home with recommendations for follow-up.    She was last seen in clinic 03/26/2023 by Dr.Agbor-Etang.  At that time she was tolerating her medications as prescribed.  Last 2 checks throughout chart review it been within normal limits and she was doing okay with no new concerns.  With normal testing and no complaints she was advised to follow-up as needed.  She returns to clinic today after recent visit to the emergency department last evening where she left without being seen.  She reported atypical chest pain to the right side of her chest where she had the labs and an EKG done in triage.  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  She has been working towards bariatric surgery and would need cardiac clearance prior to being able to proceed.  She describes her chest  discomfort on the right side of her chest as a squeezing heaviness that was worse than the pain that she had previously had.  It was without associated symptoms.  She denies any aggravating or alleviating factors as it was not with exertion.  States that she has been compliant with her current medication regimen with any undue side effects.  ROS: 10 point review of systems has been reviewed and considered negative except ones are listed in the HPI  Studies Reviewed     2D echo 09/20/2022 1. Left ventricular ejection fraction, by estimation, is 60 to 65%. The  left ventricle has normal function. The left ventricle has no regional  wall motion abnormalities. Left ventricular diastolic parameters are  consistent with Grade I diastolic  dysfunction (impaired relaxation). The average left ventricular global  longitudinal strain is -20.1 %. The global longitudinal strain is normal.   2. Right ventricular systolic function is normal. The right ventricular  size is normal. There is normal pulmonary artery systolic pressure. The  estimated right ventricular systolic pressure is 27.7 mmHg.   3. The mitral valve is normal in structure. No evidence of mitral valve  regurgitation. No evidence of mitral stenosis.   4. The aortic valve is normal in structure. Aortic valve regurgitation is  not visualized. No aortic stenosis is present.   5. The inferior vena cava is normal in size with greater than 50%  respiratory variability, suggesting right atrial pressure of 3 mmHg.   2D echo 01/05/2020 1. Left ventricular ejection fraction, by estimation, is 55 to 60%. The  left ventricle has normal function. The left ventricle has no regional  wall motion abnormalities. There is mild left ventricular hypertrophy.  Left ventricular diastolic parameters  are consistent with Grade I diastolic dysfunction (impaired relaxation).   2. Right ventricular systolic function is normal. The right ventricular  size is normal.  There is normal pulmonary artery systolic pressure.   3. The mitral valve is normal in structure. Mild mitral valve  regurgitation. No evidence of mitral stenosis.   4. The aortic valve was not well visualized. Aortic valve regurgitation  is not visualized. No aortic stenosis is present.   5. The inferior vena cava is normal in size with greater than 50%  respiratory variability, suggesting right atrial pressure of 3 mmHg.   cCTA 12/24/2019 IMPRESSION: 1. Coronary calcium  score of 0. Patient is low risk for near term coronary events   2. Normal coronary origin with right dominance.   3. No evidence of CAD.   4. CAD-RADS 0. Consider non-atherosclerotic causes of chest pain. Risk Assessment/Calculations           Physical Exam VS:  BP 110/84 (BP Location: Right Arm, Patient Position: Sitting, Cuff Size: Normal)   Pulse 78   Ht 5' 4 (1.626 m)   Wt 246 lb (111.6 kg)   LMP 07/11/2020   SpO2 98%   BMI 42.23 kg/m        Wt Readings from Last 3 Encounters:  04/16/24 246 lb (111.6 kg)  04/13/24 245 lb (111.1 kg)  04/13/24 245 lb (111.1 kg)    GEN: Well nourished, well developed in no acute distress NECK: No JVD; No carotid bruits CARDIAC: RRR, no murmurs, rubs, gallops RESPIRATORY:  Clear to auscultation without rales, wheezing or rhonchi  ABDOMEN: Soft, non-tender, non-distended EXTREMITIES:  No edema; No deformity   ASSESSMENT AND PLAN Atypical right-sided chest pain where she was evaluated in the [facility] emergency department last evening.  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  High-sensitivity troponins were negative and chest pain resolved.  With upcoming clearance needed for bariatric surgery concern for an old infarct she has been scheduled for coronary CTA.  Her last CTA done in 2021 revealed a calcium  score of 0.  Dyspnea on exertion that has been stable without any exacerbation in symptoms.  Prior  echocardiogram revealed an LVEF of 60 to 65%, no RWMA, G1 DD, without valvular abnormalities.  Hypertension with blood pressure today 110/84.  Blood pressures remain stable.  She is continued on amlodipine  5 mg daily.  Encouraged to continue to monitor pressure 1 to 2 hours postmedication administration as well.  Hyperlipidemia last LDL of 58 which remains at goal.  She is continued on atorvastatin  20 mg daily.  Ongoing management per PCP.  Morbid obesity with a BMI of 42.23.  Patient is currently being considered for weight loss surgery.  Hypokalemia noted in the emergency department with a serum potassium of 3.2.  Patient states that there was no treatment for the potassium as she left.  Repeat BMP today as she has been increasing potassium rich foods at home.  Preprocedural cardiovascular examination    Ms. Better's perioperative risk of a major cardiac event is 0.9% according to the Revised Cardiac Risk Index (RCRI).  Therefore, she is at high risk for perioperative complications.   Her functional capacity is good at 5.72 METs according to the Duke Activity Status Index (DASI). Recommendations: With recent visit to the emergency department with chest pain and abnormal EKG she has been  scheduled for coronary CTA.  Cardiac clearance will be offered once the testing is completed.    Dispo: Patient to return to clinic to see MD/APP in 6 months or sooner if needed for further evaluation.  She has been advised that if the coronary CTA is normal cardiac clearance will be offered and if it is abnormal return appointment will be moved up and cardiac clearance cannot be offered.  Signed, Leslye Puccini, NP

## 2024-04-16 NOTE — Patient Instructions (Addendum)
 Medication Instructions:  Your physician recommends that you continue on your current medications as directed. Please refer to the Current Medication list given to you today.   *If you need a refill on your cardiac medications before your next appointment, please call your pharmacy*  Lab Work: Your provider would like for you to have following labs drawn today BMP.   If you have labs (blood work) drawn today and your tests are completely normal, you will receive your results only by: MyChart Message (if you have MyChart) OR A paper copy in the mail If you have any lab test that is abnormal or we need to change your treatment, we will call you to review the results.  Testing/Procedures:   Your cardiac CT will be scheduled at one of the below locations:   [facility] [address] [place], [place] [address] [phone]  If scheduled at [facility], please arrive to the Heart and Vascular Center 15 mins early for check-in and test prep.  There is spacious parking and easy access to the radiology department from the [facility] Heart and Vascular entrance. Please enter here and check-in with the desk attendant.   Please follow these instructions carefully (unless otherwise directed):  An IV will be required for this test and Nitroglycerin  will be given.  Hold all erectile dysfunction medications at least 3 days (72 hrs) prior to test. (Ie viagra, cialis, sildenafil, tadalafil, etc)   On the Night Before the Test: Be sure to Drink plenty of water. Do not consume any caffeinated/decaffeinated beverages or chocolate 12 hours prior to your test. Do not take any antihistamines 12 hours prior to your test.  On the Day of the Test: Drink plenty of water until 1 hour prior to the test. Do not eat any food 1 hour prior to test. You may take your regular medications prior to the test.  Take metoprolol  (Lopressor ) two hours prior to test. If you take  Furosemide/Hydrochlorothiazide/Spironolactone/Chlorthalidone, please HOLD on the morning of the test. Patients who wear a continuous glucose monitor MUST remove the device prior to scanning. FEMALES- please wear underwire-free bra if available, avoid dresses & tight clothing       After the Test: Drink plenty of water. After receiving IV contrast, you may experience a mild flushed feeling. This is normal. On occasion, you may experience a mild rash up to 24 hours after the test. This is not dangerous. If this occurs, you can take Benadryl  25 mg, Zyrtec, Claritin, or Allegra and increase your fluid intake. (Patients taking Tikosyn should avoid Benadryl , and may take Zyrtec, Claritin, or Allegra) If you experience trouble breathing, this can be serious. If it is severe call 911 IMMEDIATELY. If it is mild, please call our office.  We will call to schedule your test 2-4 weeks out understanding that some insurance companies will need an authorization prior to the service being performed.   For more information and frequently asked questions, please visit our website : [email or web address]  For non-scheduling related questions, please contact the cardiac imaging nurse navigator should you have any questions/concerns: Cardiac Imaging Nurse Navigators Direct Office Dial: [phone]   For scheduling needs, including cancellations and rescheduling, please call [place], [phone].   Follow-Up: At [facility], you and your health needs are our priority.  As part of our continuing mission to provide you with exceptional heart care, our providers are all part of one team.  This team includes your primary Cardiologist (physician)  and Advanced Practice Providers or APPs (Physician Assistants and Nurse Practitioners) who all work together to provide you with the care you need, when you need it.  Your next appointment:   6 month(s)  Provider:   You may see Redell Cave, MD  or one of the following Advanced Practice Providers on your designated Care Team:   Lonni Meager, NP Lesley Maffucci, PA-C Bernardino Bring, PA-C Cadence [place], PA-C Tylene Lunch, NP Barnie Hila, NP

## 2024-04-17 DIAGNOSIS — E785 Hyperlipidemia, unspecified: Secondary | ICD-10-CM | POA: Diagnosis not present

## 2024-04-17 DIAGNOSIS — Z9884 Bariatric surgery status: Secondary | ICD-10-CM | POA: Diagnosis not present

## 2024-04-17 DIAGNOSIS — Z7982 Long term (current) use of aspirin: Secondary | ICD-10-CM | POA: Diagnosis not present

## 2024-04-17 DIAGNOSIS — K449 Diaphragmatic hernia without obstruction or gangrene: Secondary | ICD-10-CM | POA: Diagnosis not present

## 2024-04-17 DIAGNOSIS — Z79899 Other long term (current) drug therapy: Secondary | ICD-10-CM | POA: Diagnosis not present

## 2024-04-17 DIAGNOSIS — I10 Essential (primary) hypertension: Secondary | ICD-10-CM | POA: Diagnosis not present

## 2024-04-17 DIAGNOSIS — Z6841 Body Mass Index (BMI) 40.0 and over, adult: Secondary | ICD-10-CM | POA: Diagnosis not present

## 2024-04-17 DIAGNOSIS — Z713 Dietary counseling and surveillance: Secondary | ICD-10-CM | POA: Diagnosis not present

## 2024-04-17 DIAGNOSIS — Z8719 Personal history of other diseases of the digestive system: Secondary | ICD-10-CM | POA: Diagnosis not present

## 2024-04-17 DIAGNOSIS — K219 Gastro-esophageal reflux disease without esophagitis: Secondary | ICD-10-CM | POA: Diagnosis not present

## 2024-04-17 LAB — BASIC METABOLIC PANEL WITH GFR
BUN/Creatinine Ratio: 16 (ref 9–23)
BUN: 13 mg/dL (ref 6–24)
CO2: 23 mmol/L (ref 20–29)
Calcium: 9.7 mg/dL (ref 8.7–10.2)
Chloride: 103 mmol/L (ref 96–106)
Creatinine, Ser: 0.82 mg/dL (ref 0.57–1.00)
Glucose: 106 mg/dL — ABNORMAL HIGH (ref 70–99)
Potassium: 4.1 mmol/L (ref 3.5–5.2)
Sodium: 142 mmol/L (ref 134–144)
eGFR: 84 mL/min/1.73 (ref >59)

## 2024-04-21 ENCOUNTER — Other Ambulatory Visit: Payer: Self-pay | Admitting: Physician Assistant

## 2024-04-21 ENCOUNTER — Ambulatory Visit: Payer: Self-pay | Admitting: Cardiology

## 2024-04-21 DIAGNOSIS — K2100 Gastro-esophageal reflux disease with esophagitis, without bleeding: Secondary | ICD-10-CM

## 2024-04-21 NOTE — Progress Notes (Signed)
 Kidney function and potassium remain stable.  Continue current medication regimen without changes needed at this time.

## 2024-04-22 ENCOUNTER — Ambulatory Visit: Admitting: Family Medicine

## 2024-04-30 ENCOUNTER — Telehealth: Payer: Self-pay

## 2024-05-02 DIAGNOSIS — G4733 Obstructive sleep apnea (adult) (pediatric): Secondary | ICD-10-CM | POA: Diagnosis not present

## 2024-05-07 ENCOUNTER — Encounter (HOSPITAL_COMMUNITY): Payer: Self-pay

## 2024-05-11 ENCOUNTER — Ambulatory Visit
Admission: RE | Admit: 2024-05-11 | Discharge: 2024-05-11 | Disposition: A | Discharge status: Home / Self Care | Source: Ambulatory Visit | Attending: Cardiology | Admitting: Cardiology

## 2024-05-11 ENCOUNTER — Other Ambulatory Visit: Payer: Self-pay | Admitting: Family Medicine

## 2024-05-11 ENCOUNTER — Other Ambulatory Visit: Payer: Self-pay | Admitting: Physician Assistant

## 2024-05-11 DIAGNOSIS — R072 Precordial pain: Secondary | ICD-10-CM | POA: Insufficient documentation

## 2024-05-11 DIAGNOSIS — K2100 Gastro-esophageal reflux disease with esophagitis, without bleeding: Secondary | ICD-10-CM

## 2024-05-11 DIAGNOSIS — I10 Essential (primary) hypertension: Secondary | ICD-10-CM

## 2024-05-11 MED ORDER — IOHEXOL 350 MG/ML SOLN
100.0000 mL | Freq: Once | INTRAVENOUS | Status: AC | PRN
  Administered 2024-05-11: 100 mL via INTRAVENOUS

## 2024-05-11 MED ORDER — NITROGLYCERIN 0.4 MG SL SUBL
0.8000 mg | SUBLINGUAL_TABLET | Freq: Once | SUBLINGUAL | Status: AC
  Administered 2024-05-11: 0.8 mg via SUBLINGUAL
  Filled 2024-05-11: qty 25

## 2024-05-11 NOTE — Progress Notes (Signed)
 Coronary calcium  score of 0.  No evidence of coronary artery disease.  Recommendation is to consider nonatherosclerotic causes of chest pain.  Awaiting radiology overread from chest CT.

## 2024-05-11 NOTE — Progress Notes (Signed)
 Patient tolerated procedure well. W/C to lobby.  Ambulate w/o difficulty. Denies light headedness or being dizzy. Encouraged to drink extra water today and reasoning explained. Verbalized understanding. All questions answered. ABC intact. No further needs. Discharge from procedure area w/o issues.

## 2024-05-11 NOTE — Telephone Encounter (Signed)
 There is no evidence of a blockage with the scan revealing no coronary artery disease and a calcium  score of 0. She can proceed with surgery at acceptable risk without any other testing needed from our standpoint.

## 2024-05-12 ENCOUNTER — Encounter: Payer: Self-pay | Admitting: Family Medicine

## 2024-05-12 DIAGNOSIS — K2100 Gastro-esophageal reflux disease with esophagitis, without bleeding: Secondary | ICD-10-CM

## 2024-05-12 MED ORDER — FAMOTIDINE 20 MG PO TABS: 20.0000 mg | ORAL_TABLET | Freq: Two times a day (BID) | ORAL | 5 refills | Status: AC

## 2024-05-13 NOTE — Telephone Encounter (Signed)
 Requested Prescriptions  Pending Prescriptions Disp Refills   amLODipine  (NORVASC ) 5 MG tablet [Pharmacy Med Name: amLODIPine  Besylate 5 MG Oral Tablet] 90 tablet 0    Sig: Take 1 tablet by mouth once daily     Cardiovascular: Calcium  Channel Blockers 2 Passed - 05/13/2024 11:29 AM      Passed - Last BP in normal range    BP Readings from Last 1 Encounters:  05/11/24 123/76         Passed - Last Heart Rate in normal range    Pulse Readings from Last 1 Encounters:  05/11/24 69         Passed - Valid encounter within last 6 months    Recent Outpatient Visits           2 months ago Annual physical exam   [place] [facility] [place], Marsa PARAS, DO   5 months ago Chronic hip pain, bilateral   [place] [facility] [place], Marsa PARAS, DO   6 months ago Oral thrush   [facility] [place], Angeline ORN, NP   7 months ago Multifocal pneumonia   [place] [facility] [place], Marsa PARAS, [place]

## 2024-05-14 NOTE — Telephone Encounter (Signed)
 The RCRI and DASI are calculations based off of several different factors. Therefore the coronary CTA she had completed clears her without any further testing from the cardiac standpoint.

## 2024-05-21 NOTE — Progress Notes (Signed)
 No significant findings on the chest CT portion of the test.

## 2024-06-10 ENCOUNTER — Telehealth: Payer: Self-pay | Admitting: *Deleted

## 2024-06-10 DIAGNOSIS — I10 Essential (primary) hypertension: Secondary | ICD-10-CM

## 2024-06-10 NOTE — Progress Notes (Signed)
 Complex Care Management Care Guide Note  06/10/2024 Name: Diamantina Edinger MRN: [id number] DOB: 10/25/1965  Christabel Camire is a 58 y.o. year old female who is a primary care patient of Edman Marsa PARAS, DO and is actively engaged with the care management team. I reached out to Levorn Louder by phone today to assist with re-scheduling  with the RN Case Manager.  Follow up plan: Unsuccessful telephone outreach attempt made. A HIPAA compliant phone message was left for the patient providing contact information and requesting a return call.  Harlene Satterfield  [facility]  Value-Based Care Institute, [facility] Guide  Direct Dial: [phone]  Fax [phone]

## 2024-06-10 NOTE — Progress Notes (Signed)
 Complex Care Management Note  Care Guide Note 06/10/2024 Name: Morgan Stevens MRN: [id number] DOB: [birth year]  Morgan Stevens is a 58 y.o. year old female who sees Edman Marsa PARAS, DO for primary care. I reached out to Levorn Louder by phone today to offer complex care management services.  Ms. Emory was given information about Complex Care Management services today including:   The Complex Care Management services include support from the care team which includes your Nurse Care Manager, Clinical Social Worker, or Pharmacist.  The Complex Care Management team is here to help remove barriers to the health concerns and goals most important to you. Complex Care Management services are voluntary, and the patient may decline or stop services at any time by request to their care team member.   Complex Care Management Consent Status: Patient agreed to services and verbal consent obtained.   Follow up plan:  Telephone appointment with complex care management team member scheduled for:  06/15/24  Encounter Outcome:  Patient Scheduled  Harlene Satterfield  [facility]  [facility] Guide  Direct Dial: [phone]  Fax [phone]

## 2024-06-15 ENCOUNTER — Telehealth: Payer: Self-pay | Admitting: *Deleted

## 2024-06-15 NOTE — Progress Notes (Unsigned)
 Complex Care Management Care Guide Note  06/15/2024 Name: Haydyn Liddell MRN: [id number] DOB: 11/30/1965  Havah Ammon is a 58 y.o. year old female who is a primary care patient of Edman Marsa PARAS, DO and is actively engaged with the care management team. I reached out to Levorn Louder by phone today to assist with re-scheduling  with the RN Case Manager.  Follow up plan: Unsuccessful telephone outreach attempt made. A HIPAA compliant phone message was left for the patient providing contact information and requesting a return call.  Harlene Satterfield  [facility] Health  Value-Based Care Institute, [facility] Guide  Direct Dial: [phone]  Fax [phone]

## 2024-06-18 ENCOUNTER — Other Ambulatory Visit: Payer: Self-pay | Admitting: Medical Genetics

## 2024-06-18 DIAGNOSIS — Z006 Encounter for examination for normal comparison and control in clinical research program: Secondary | ICD-10-CM

## 2024-06-18 NOTE — Progress Notes (Unsigned)
 Complex Care Management Care Guide Note  06/18/2024 Name: Rosetta Rupnow MRN: [id number] DOB: 1966-02-21  Nea Gittens is a 58 y.o. year old female who is a primary care patient of Edman Marsa PARAS, DO and is actively engaged with the care management team. I reached out to Levorn Louder by phone today to assist with re-scheduling  with the RN Case Manager.  Follow up plan: Unsuccessful telephone outreach attempt made. A HIPAA compliant phone message was left for the patient providing contact information and requesting a return call.  Harlene Satterfield  [facility]  Value-Based Care Institute, [facility] Guide  Direct Dial: [phone]  Fax [phone]

## 2024-06-19 NOTE — Progress Notes (Signed)
 Complex Care Management Care Guide Note  06/19/2024 Name: Morgan Stevens MRN: [id number] DOB: 07/15/1966  Morgan Stevens is a 58 y.o. year old female who is a primary care patient of Edman Marsa PARAS, DO and is actively engaged with the care management team. I reached out to Levorn Louder by phone today to assist with re-scheduling  with the RN Case Manager.  Follow up plan: Unsuccessful telephone outreach attempt made. A HIPAA compliant phone message was left for the patient providing contact information and requesting a return call. No further outreach attempts will be made at this time. We have been unable to contact the patient to reschedule for complex care management services.   Harlene Satterfield  [facility] Health  Value-Based Care Institute, [facility] Guide  Direct Dial: [phone]  Fax [phone]

## 2024-06-20 ENCOUNTER — Other Ambulatory Visit: Payer: Self-pay | Admitting: Family Medicine

## 2024-06-20 DIAGNOSIS — R053 Chronic cough: Secondary | ICD-10-CM

## 2024-06-20 DIAGNOSIS — J3089 Other allergic rhinitis: Secondary | ICD-10-CM

## 2024-06-20 DIAGNOSIS — J9801 Acute bronchospasm: Secondary | ICD-10-CM

## 2024-06-22 NOTE — Telephone Encounter (Signed)
 Requested Prescriptions  Pending Prescriptions Disp Refills   montelukast  (SINGULAIR ) 10 MG tablet [Pharmacy Med Name: Montelukast  Sodium 10 MG Oral Tablet] 90 tablet 2    Sig: TAKE 1 TABLET BY MOUTH AT BEDTIME     Pulmonology:  Leukotriene Inhibitors Passed - 06/22/2024  5:41 PM      Passed - Valid encounter within last 12 months    Recent Outpatient Visits           3 months ago Annual physical exam   [place] [facility] [place], Marsa PARAS, DO   6 months ago Chronic hip pain, bilateral   [place] [facility] Edman Marsa PARAS, DO   8 months ago Oral thrush   [place] [facility] [place], Angeline ORN, NP   8 months ago Multifocal pneumonia   [place] [facility] [place], Marsa PARAS, [place]

## 2024-07-16 ENCOUNTER — Ambulatory Visit: Admitting: Family Medicine

## 2024-07-17 LAB — GENECONNECT MOLECULAR SCREEN: Genetic Analysis Overall Interpretation: NEGATIVE

## 2024-07-28 ENCOUNTER — Other Ambulatory Visit: Payer: Self-pay | Admitting: Physician Assistant

## 2024-07-28 DIAGNOSIS — K2100 Gastro-esophageal reflux disease with esophagitis, without bleeding: Secondary | ICD-10-CM

## 2024-08-07 ENCOUNTER — Ambulatory Visit: Admitting: Family Medicine

## 2024-08-11 ENCOUNTER — Other Ambulatory Visit: Payer: Self-pay | Admitting: Family Medicine

## 2024-08-11 DIAGNOSIS — I10 Essential (primary) hypertension: Secondary | ICD-10-CM

## 2024-08-11 DIAGNOSIS — E7800 Pure hypercholesterolemia, unspecified: Secondary | ICD-10-CM

## 2024-08-14 ENCOUNTER — Other Ambulatory Visit: Payer: Self-pay | Admitting: Family Medicine

## 2024-08-14 DIAGNOSIS — I10 Essential (primary) hypertension: Secondary | ICD-10-CM

## 2024-08-14 DIAGNOSIS — E7800 Pure hypercholesterolemia, unspecified: Secondary | ICD-10-CM

## 2024-08-14 NOTE — Telephone Encounter (Signed)
 Requested Prescriptions  Pending Prescriptions Disp Refills   amLODipine  (NORVASC ) 5 MG tablet [Pharmacy Med Name: amLODIPine  Besylate 5 MG Oral Tablet] 90 tablet 0    Sig: Take 1 tablet by mouth once daily     Cardiovascular: Calcium  Channel Blockers 2 Passed - 08/14/2024  1:10 PM      Passed - Last BP in normal range    BP Readings from Last 1 Encounters:  05/11/24 123/76         Passed - Last Heart Rate in normal range    Pulse Readings from Last 1 Encounters:  05/11/24 69         Passed - Valid encounter within last 6 months    Recent Outpatient Visits           5 months ago Annual physical exam   [place] [facility] [place], Marsa PARAS, DO   8 months ago Chronic hip pain, bilateral   [place] [facility] Edman Marsa PARAS, DO   9 months ago Oral thrush   [facility] [place], [place] W, NP   10 months ago Multifocal pneumonia   [place] [facility] [place], Marsa PARAS, DO               atorvastatin  (LIPITOR) 20 MG tablet [Pharmacy Med Name: Atorvastatin  Calcium  20 MG Oral Tablet] 90 tablet 0    Sig: TAKE 1 TABLET BY MOUTH AT BEDTIME     Cardiovascular:  Antilipid - Statins Failed - 08/14/2024  1:10 PM      Failed - Lipid Panel in normal range within the last 12 months    Cholesterol  Date Value Ref Range Status  01/22/2024 128 <200 mg/dL Final  90/87/7985 834 0 - 200 mg/dL Final   Ldl Cholesterol, Calc  Date Value Ref Range Status  05/08/2013 94 0 - 100 mg/dL Final   LDL Cholesterol (Calc)  Date Value Ref Range Status  01/22/2024 68 mg/dL (calc) Final    Comment:    Reference range: <100 . Desirable range <100 mg/dL for primary prevention;   <70 mg/dL for patients with CHD or diabetic patients  with > or = 2 CHD risk factors. SABRA LDL-C is now calculated using the Martin-Hopkins  calculation, which is a validated novel method providing  better accuracy  than the Friedewald equation in the  estimation of LDL-C.  Gladis APPLETHWAITE et al. SANDREA. 7986;689(80): 2061-2068  (http://education.Quest[employer]ostics.com/faq/FAQ164)    HDL Cholesterol  Date Value Ref Range Status  05/08/2013 40 40 - 60 mg/dL Final   HDL  Date Value Ref Range Status  01/22/2024 42 (L) > OR = 50 mg/dL Final   Triglycerides  Date Value Ref Range Status  01/22/2024 95 <150 mg/dL Final  90/87/7985 845 0 - 200 mg/dL Final         Passed - Patient is not pregnant      Passed - Valid encounter within last 12 months    Recent Outpatient Visits           5 months ago Annual physical exam   [facility] [place], Marsa PARAS, DO   8 months ago Chronic hip pain, bilateral   [place] [facility] Edman Marsa PARAS, DO   9 months ago Oral thrush    [facility] [place], Angeline ORN, NP   10 months ago Multifocal pneumonia   [facility]  [facility] [place], Marsa PARAS, DO

## 2024-08-18 NOTE — Telephone Encounter (Signed)
 Duplicate request.  Requested Prescriptions  Pending Prescriptions Disp Refills   amLODipine  (NORVASC ) 5 MG tablet [Pharmacy Med Name: amLODIPine  Besylate 5 MG Oral Tablet] 90 tablet 0    Sig: Take 1 tablet by mouth once daily     Cardiovascular: Calcium  Channel Blockers 2 Passed - 08/18/2024  2:52 PM      Passed - Last BP in normal range    BP Readings from Last 1 Encounters:  05/11/24 123/76         Passed - Last Heart Rate in normal range    Pulse Readings from Last 1 Encounters:  05/11/24 69         Passed - Valid encounter within last 6 months    Recent Outpatient Visits           5 months ago Annual physical exam   [place] [facility] [place], Marsa PARAS, DO   8 months ago Chronic hip pain, bilateral   [facility] [place] Marsa PARAS, DO   9 months ago Oral thrush   [place] [facility] [place], [place], NP   10 months ago Multifocal pneumonia   [place] [facility] [place], Marsa PARAS, DO               atorvastatin  (LIPITOR) 20 MG tablet [Pharmacy Med Name: Atorvastatin  Calcium  20 MG Oral Tablet] 90 tablet 0    Sig: TAKE 1 TABLET BY MOUTH AT BEDTIME     Cardiovascular:  Antilipid - Statins Failed - 08/18/2024  2:52 PM      Failed - Lipid Panel in normal range within the last 12 months    Cholesterol  Date Value Ref Range Status  01/22/2024 128 <200 mg/dL Final  90/87/7985 834 0 - 200 mg/dL Final   Ldl Cholesterol, Calc  Date Value Ref Range Status  05/08/2013 94 0 - 100 mg/dL Final   LDL Cholesterol (Calc)  Date Value Ref Range Status  01/22/2024 68 mg/dL (calc) Final    Comment:    Reference range: <100 . Desirable range <100 mg/dL for primary prevention;   <70 mg/dL for patients with CHD or diabetic patients  with > or = 2 CHD risk factors. SABRA LDL-C is now calculated using the Martin-Hopkins  calculation, which is a validated novel method  providing  better accuracy than the Friedewald equation in the  estimation of LDL-C.  Gladis APPLETHWAITE et al. SANDREA. 7986;689(80): 2061-2068  (http://education.[employer].com/faq/FAQ164)    HDL Cholesterol  Date Value Ref Range Status  05/08/2013 40 40 - 60 mg/dL Final   HDL  Date Value Ref Range Status  01/22/2024 42 (L) > OR = 50 mg/dL Final   Triglycerides  Date Value Ref Range Status  01/22/2024 95 <150 mg/dL Final  90/87/7985 845 0 - 200 mg/dL Final         Passed - Patient is not pregnant      Passed - Valid encounter within last 12 months    Recent Outpatient Visits           5 months ago Annual physical exam   [place] [facility] [place], Marsa PARAS, DO   8 months ago Chronic hip pain, bilateral   [place] [facility] [place] Marsa PARAS, DO   9 months ago Oral thrush   [place] [facility] [place], Angeline ORN, NP   10 months ago Multifocal pneumonia  [facility] [place], Marsa PARAS, [place]

## 2024-08-26 ENCOUNTER — Ambulatory Visit: Admitting: Family Medicine

## 2024-09-08 ENCOUNTER — Encounter: Payer: Self-pay | Admitting: Family Medicine

## 2024-09-09 ENCOUNTER — Ambulatory Visit: Admitting: Family Medicine

## 2024-09-09 VITALS — BP 130/84 | HR 69 | Ht 64.0 in | Wt 254.2 lb

## 2024-09-09 DIAGNOSIS — G8929 Other chronic pain: Secondary | ICD-10-CM | POA: Diagnosis not present

## 2024-09-09 DIAGNOSIS — M7551 Bursitis of right shoulder: Secondary | ICD-10-CM

## 2024-09-09 DIAGNOSIS — J069 Acute upper respiratory infection, unspecified: Secondary | ICD-10-CM

## 2024-09-09 DIAGNOSIS — M25511 Pain in right shoulder: Secondary | ICD-10-CM | POA: Diagnosis not present

## 2024-09-09 MED ORDER — NAPROXEN 500 MG PO TABS: 500.0000 mg | ORAL_TABLET | Freq: Two times a day (BID) | ORAL | 0 refills | Status: AC

## 2024-09-09 MED ORDER — PREDNISONE 20 MG PO TABS: ORAL_TABLET | ORAL | 0 refills | Status: AC

## 2024-09-09 NOTE — Progress Notes (Signed)
 "  Subjective:    Patient ID: Morgan Stevens, female    DOB: 02-02-1966, 59 y.o.   MRN: [id number]  Morgan Stevens is a 59 y.o. female presenting on 09/09/2024 for Shoulder Pain (Right shoulder pain about 2 months )   HPI  Discussed the use of AI scribe software for clinical note transcription with the patient, who gave verbal consent to proceed.  History of Present Illness   Morgan Stevens is a 59 year old female who presents with right shoulder pain.  Right shoulder pain, chronic / bursitis - Onset approximately two months ago, shortly after Thanksgiving, following an incident reaching for an object - Initial pain described as achy, acutely worsened to severe pain with sensation of shoulder being 'frozen' or 'stuck' - Used a sling for several days until pain subsided enough to discontinue - Persistent pain, especially with certain movements such as lifting arm or bringing it back down - Pain described as 'pulling' and 'really hurts' with movement above shoulder level and with arm rotation - Pain radiates down the arm - No numbness or tingling - Difficulty with activities of daily living, including washing hair, due to pain - Occasional pain across the top of the shoulder that radiates, but primarily frontal pain  Functional limitation - Difficulty performing activities requiring overhead movement or arm rotation - Impaired ability to wash hair due to pain and restricted range of motion  Prior treatments and medication use - Tried anti-inflammatories, muscle relaxants, and physical therapy - Currently taking Tylenol  Extra Strength, two pills three times a day, and methocarbamol  500 mg twice a day, with minimal relief - Gabapentin  in use, but provides no significant benefit for shoulder pain  Relevant surgical and medical history - History of left shoulder arthroscopy, debridement, and biceps tendon repair in 2023 - History of cervical discectomy and fusion at C5-C7, complicating  assessment of neck-related symptoms  Medication tolerance and gastrointestinal symptoms - History of indigestion and gastritis, limiting use of certain medications        Past Surgical History:  Procedure Laterality Date   ANTERIOR CERVICAL DECOMP/DISCECTOMY FUSION N/A 11/14/2022   Procedure: C5-7 ANTERIOR CERVICAL DISCECTOMY AND FUSION (NUVASIVE ACP, GLOBUS HEDRON);  Surgeon: Clois Fret, MD;  Location: [facility] ORS;  Service: Neurosurgery;  Laterality: N/A;   CARDIAC CATHETERIZATION  02/27/2016   Procedure: Left Heart Cath and Coronary Angiography;  Surgeon: Candyce GORMAN Reek, MD;  Location: [facility] INVASIVE CV LAB;  Service: Cardiovascular;;   CESAREAN SECTION     CHOLECYSTECTOMY     COLONOSCOPY WITH PROPOFOL  N/A 04/15/2019   Procedure: COLONOSCOPY WITH PROPOFOL ;  Surgeon: Janalyn Keene NOVAK, MD;  Location: [facility] ENDOSCOPY;  Service: Endoscopy;  Laterality: N/A;   ESOPHAGOGASTRODUODENOSCOPY N/A 11/05/2022   Procedure: ESOPHAGOGASTRODUODENOSCOPY (EGD);  Surgeon: Onita Elspeth Sharper, DO;  Location: [facility] ENDOSCOPY;  Service: Gastroenterology;  Laterality: N/A;   ESOPHAGOGASTRODUODENOSCOPY (EGD) WITH PROPOFOL  N/A 04/15/2019   Procedure: ESOPHAGOGASTRODUODENOSCOPY (EGD) WITH PROPOFOL ;  Surgeon: Janalyn Keene NOVAK, MD;  Location: [facility] ENDOSCOPY;  Service: Endoscopy;  Laterality: N/A;   SHOULDER ARTHROSCOPY WITH DEBRIDEMENT AND BICEP TENDON REPAIR Left 02/06/2022   Procedure: Left shoulder arthroscopic debridement, subacromial decompression, open subpectoral biceps tenodesis, and  Regeneten Patch application;  Surgeon: Tobie Priest, MD;  Location: [facility];  Service: Orthopedics;  Laterality: Left;   THYROIDECTOMY  2018   THYROIDECTOMY          09/09/2024    1:41 PM 11/27/2023    3:28 PM 10/02/2023    3:17 PM  Depression screen PHQ 2/9  Decreased Interest 2 2 2   Down, Depressed, Hopeless 3 3 2   PHQ - 2 Score 5 5 4   Altered sleeping 1 3 0  Tired, decreased energy 3 3 3    Change in appetite 1 3 2   Feeling bad or failure about yourself  3 2 0  Trouble concentrating 1 0 0  Moving slowly or fidgety/restless 0 0 0  Suicidal thoughts 0 0 0  PHQ-9 Score 14 16  9    Difficult doing work/chores Somewhat difficult Very difficult Somewhat difficult     Data saved with a previous flowsheet row definition       09/09/2024    1:41 PM 11/27/2023    3:29 PM 10/02/2023    3:17 PM 08/27/2023    2:53 PM  GAD 7 : Generalized Anxiety Score  Nervous, Anxious, on Edge 3 3 2 3   Control/stop worrying 3 3 1 3   Worry too much - different things 3 3 1 3   Trouble relaxing 3 3 1 3   Restless 0 0 0 0  Easily annoyed or irritable 2 3 2 3   Afraid - awful might happen 2 2 1 2   Total GAD 7 Score 16 17 8 17   Anxiety Difficulty Somewhat difficult Very difficult Not difficult at all     Social History[1]  Review of Systems Per HPI unless specifically indicated above     Objective:    BP 130/84 (BP Location: Right Arm, Patient Position: Sitting, Cuff Size: Large)   Pulse 69   Ht 5' 4 (1.626 m)   Wt 254 lb 4 oz (115.3 kg)   LMP 07/11/2020   SpO2 96%   BMI 43.64 kg/m   Wt Readings from Last 3 Encounters:  09/09/24 254 lb 4 oz (115.3 kg)  04/16/24 246 lb (111.6 kg)  04/13/24 245 lb (111.1 kg)    Physical Exam Vitals and nursing note reviewed.  Constitutional:      General: She is not in acute distress.    Appearance: Normal appearance. She is well-developed. She is not diaphoretic.     Comments: Well-appearing, comfortable, cooperative  HENT:     Head: Normocephalic and atraumatic.  Eyes:     General:        Right eye: No discharge.        Left eye: No discharge.     Conjunctiva/sclera: Conjunctivae normal.  Neck:     Thyroid : No thyromegaly.  Cardiovascular:     Rate and Rhythm: Normal rate and regular rhythm.     Heart sounds: Normal heart sounds. No murmur heard. Pulmonary:     Effort: Pulmonary effort is normal. No respiratory distress.     Breath sounds:  Normal breath sounds. No wheezing or rales.  Musculoskeletal:     Cervical back: Normal range of motion and neck supple.     Comments: Right Shoulder Inspection: Normal appearance bilateral symmetrical Palpation: mild tender anterior lateral.  ROM: Reduced forward flexion and abduction. Reduced internal rotation. Special Testing: Rotator cuff testing positive for pain on full and empty can, positive hawkin's impingement. Strength: Normal strength 5/5 flex/ext, ext rot / int rot, grip, rotator cuff str testing. Neurovascular: Distally intact pulses, sensation to light touch  Lymphadenopathy:     Cervical: No cervical adenopathy.  Skin:    General: Skin is warm and dry.     Findings: No erythema or rash.  Neurological:     Mental Status: She is alert and oriented to person, place,  and time.  Psychiatric:        Mood and Affect: Mood normal.        Behavior: Behavior normal.        Thought Content: Thought content normal.     Comments: Well groomed, good eye contact, normal speech and thoughts     Results for orders placed or performed in visit on 06/18/24  GeneConnect Molecular Screen   Collection Time: 07/10/24  4:27 PM  Result Value Ref Range   Genetic Analysis Overall Interpretation Negative    Genetic Disease Assessed      This is a screening test and does not detect all pathogenic or likely pathogenic variant(s) in the tested genes; diagnostic testing is recommended for individuals with a personal or family history of heart disease or hereditary cancer. [employer] Tier One  Population Screen is a screening test that analyzes 11 genes related to hereditary breast and ovarian cancer (HBOC) syndrome, Lynch syndrome, and familial hypercholesterolemia. This test only reports clinically significant pathogenic and likely  pathogenic variants but does not report variants of uncertain significance (VUS). In addition, analysis of the PMS2 gene excludes exons 11-15, which overlap with a known  pseudogene (PMS2CL).    Genetic Analysis Report      No pathogenic or likely pathogenic variants were detected in the genes analyzed by this test.Genetic test results should be interpreted in the context of an individual's personal medical and family history. Alteration to medical management is NOT  recommended based solely on this result. Clinical correlation is advised.Additional Considerations- This is a screening test; individuals may still carry pathogenic or likely pathogenic variant(s) in the tested genes that are not detected by this test.-  For individuals at risk for these or other related conditions based on factors including personal or family history, diagnostic testing is recommended.- The absence of pathogenic or likely pathogenic variant(s) in the analyzed genes, while reassuring,  does not eliminate the possibility of a hereditary condition; there are other variants and genes associated with heart disease and hereditary cancer that are not included in this test.    Genes Tested See Notes    Disclaimer See Notes    Sequencing Location See Notes    Interpretation Methods and Limitations See Notes       Assessment & Plan:   Problem List Items Addressed This Visit   None Visit Diagnoses       Chronic right shoulder pain    -  Primary   Relevant Medications   predniSONE  (DELTASONE ) 20 MG tablet   naproxen  (NAPROSYN ) 500 MG tablet   Other Relevant Orders   DG Shoulder Right     Chronic bursitis of right shoulder       Relevant Medications   predniSONE  (DELTASONE ) 20 MG tablet   naproxen  (NAPROSYN ) 500 MG tablet   Other Relevant Orders   DG Shoulder Right     Viral URI with cough            Chronic right shoulder bursitis with pain Chronic bursitis likely due to rotator cuff aggravation. Limited relief from Tylenol  and methocarbamol . Gastritis limits NSAID use. Prednisone  considered for inflammation reduction. - Prescribed 7-day prednisone  course. - Continue Tylenol   as needed. AFTER prednisone  - Prescribed short-term naproxen  post-prednisone , with food, limited to a few days to a week. - Provided shoulder mobility exercises: wall crawls, pendulums. - Ordered right shoulder x-ray, optional timing. [facility] walk in if interested - Discussed potential MRI if symptoms persist or worsen.  Note  she has had prior L Shoulder surgery for similar issue, had chronic L Shoulder pain and limitation. She ultimately had arthroscopy with clean up and biceps tendon repair  Acute upper respiratory infection with cough Likely viral infection. Previous inhaler use for similar symptoms. Prednisone  may reduce airway inflammation. - Use inhaler as needed. - Prescribed prednisone  for airway inflammation reduction.        Orders Placed This Encounter  Procedures   DG Shoulder Right    Standing Status:   Future    Expiration Date:   09/09/2025    Reason for Exam (SYMPTOM  OR DIAGNOSIS REQUIRED):   chronic right shoulder pain, suspect bursitis    Is patient pregnant?:   No    Preferred imaging location?:   [facility]    Meds ordered this encounter  Medications   predniSONE  (DELTASONE ) 20 MG tablet    Sig: Take daily with food. Start with 60mg  (3 pills) x 2 days, then reduce to 40mg  (2 pills) x 2 days, then 20mg  (1 pill) x 3 days    Dispense:  13 tablet    Refill:  0   naproxen  (NAPROSYN ) 500 MG tablet    Sig: Take 1 tablet (500 mg total) by mouth 2 (two) times daily with a meal. For 1 week or less as needed    Dispense:  60 tablet    Refill:  0    Only to take after Prednisone  if needed    Follow up plan: Return if symptoms worsen or fail to improve.   Marsa Officer, DO [facility] 09/09/2024, 2:12 PM     [1]  Social History Tobacco Use   Smoking status: Never   Smokeless tobacco: Never  Vaping Use   Vaping status: Never Used  Substance Use Topics   Alcohol use: No    Alcohol/week: 0.0 standard  drinks of alcohol   Drug use: No   "

## 2024-09-09 NOTE — Patient Instructions (Addendum)
 Thank you for coming to the office today.  Most likely you have bursitis of your shoulder. This is inflammation of the shoulder joint caused most often by arthritis or wear and tear. Often it can flare up to cause bursitis due to repetitive activities or other triggers. It may take time to heal, possibly 2 to 6 weeks, and it is important to avoid over use of shoulder especially above head motions that can re-aggravate the problem.  Start Prednisone  taper Do not take Naproxen  yet, wait until finish Prednisone   AFTER prednisone  Recommend trial of Anti-inflammatory with Naproxen  (Naprosyn ) 500mg  tabs - take one with food and plenty of water TWICE daily every day (breakfast and dinner), for 1 week or less, then you may take only as needed  - DO NOT TAKE any ibuprofen , aleve , motrin  while you are taking this medicine  Recommend to start taking Tylenol  Extra Strength 500mg  tabs - take 1 to 2 tabs per dose (max 1000mg ) every 6-8 hours for pain (take regularly, don't skip a dose for next 7 days), max 24 hour daily dose is 6 tablets or 3000mg . In the future you can repeat the same everyday Tylenol  course for 1-2 weeks at a time.   FUTURE X-ray walk in as needed  Also we can refer you to Physical Therapy if just need to improve on range of motion.   Range of Motion Shoulder Exercises  Pendulum Circles - Lean with your good arm against a counter or table for support - Bend forward with a wide stance (make sure your body is comfortable) - Your painful shoulder should hang down and feel heavy - Gently move your painful arm in small circles clockwise for several turns - Switch to counterclockwise for several turns - Early on keep circles narrow and move slowly - Later in rehab, move in larger circles and faster movement   Wall Crawl - Stand close (about 1-2 ft away) to a wall, facing it directly - Reach out with your arm of painful shoulder and place fingers (not palm) on wall - You should  make contact with wall at your waist level - Slowly walk your fingers up the wall. Stay in contact with wall entire time, do not remove fingers - Keep walking fingers up wall until you reach shoulder level - You may feel tightening or mild discomfort, once you reach a height that causes pain or if you are already above your shoulder height then stop. Repeat from starting position. - Early on stand closer to wall, move fingers slowly, and stay at or below shoulder level - Later in rehab, stand farther away from wall (fingertips), move fingers quicker, go above shoulder level      Please schedule a Follow-up Appointment to: Return if symptoms worsen or fail to improve.  If you have any other questions or concerns, please feel free to call the office or send a message through MyChart. You may also schedule an earlier appointment if necessary.  Additionally, you may be receiving a survey about your experience at our office within a few days to 1 week by e-mail or mail. We value your feedback.  Marsa Officer, DO [facility], [place]

## 2024-09-18 ENCOUNTER — Ambulatory Visit: Admitting: Family Medicine

## 2024-09-23 ENCOUNTER — Emergency Department

## 2024-09-23 ENCOUNTER — Other Ambulatory Visit: Payer: Self-pay

## 2024-09-23 ENCOUNTER — Emergency Department
Admission: EM | Admit: 2024-09-23 | Discharge: 2024-09-23 | Disposition: A | Discharge status: Home / Self Care | Attending: Emergency Medicine | Admitting: Emergency Medicine

## 2024-09-23 DIAGNOSIS — I10 Essential (primary) hypertension: Secondary | ICD-10-CM | POA: Insufficient documentation

## 2024-09-23 DIAGNOSIS — R079 Chest pain, unspecified: Secondary | ICD-10-CM | POA: Diagnosis present

## 2024-09-23 DIAGNOSIS — E039 Hypothyroidism, unspecified: Secondary | ICD-10-CM | POA: Insufficient documentation

## 2024-09-23 LAB — BASIC METABOLIC PANEL WITH GFR
Anion gap: 12 (ref 5–15)
BUN: 9 mg/dL (ref 6–20)
CO2: 27 mmol/L (ref 22–32)
Calcium: 8.9 mg/dL (ref 8.9–10.3)
Chloride: 102 mmol/L (ref 98–111)
Creatinine, Ser: 0.84 mg/dL (ref 0.44–1.00)
GFR, Estimated: >60 mL/min
Glucose, Bld: 183 mg/dL — ABNORMAL HIGH (ref 70–99)
Potassium: 3.5 mmol/L (ref 3.5–5.1)
Sodium: 140 mmol/L (ref 135–145)

## 2024-09-23 LAB — CBC
HCT: 42.2 % (ref 36.0–46.0)
Hemoglobin: 14.5 g/dL (ref 12.0–15.0)
MCH: 29.9 pg (ref 26.0–34.0)
MCHC: 34.4 g/dL (ref 30.0–36.0)
MCV: 87 fL (ref 80.0–100.0)
Platelets: 315 10*3/uL (ref 150–400)
RBC: 4.85 MIL/uL (ref 3.87–5.11)
RDW: 12.8 % (ref 11.5–15.5)
WBC: 7.8 10*3/uL (ref 4.0–10.5)
nRBC: 0 % (ref 0.0–0.2)

## 2024-09-23 LAB — TROPONIN T, HIGH SENSITIVITY
Troponin T High Sensitivity: 7 ng/L (ref 0–19)
Troponin T High Sensitivity: 8 ng/L (ref 0–19)

## 2024-09-23 NOTE — ED Provider Notes (Signed)
 "   [facility] Emergency Department Provider Note     Event Date/Time   First MD Initiated Contact with Patient 09/23/24 1722     (approximate)   History   Chest Pain   HPI  Morgan Stevens is a 59 y.o. female with a history of HTN, thyroid  disease, HLD, GERD, hypothyroidism, gastritis, and obesity, who presents to the ED endorsing central chest pain.  Patient reports onset of symptoms at about 1400 this afternoon.  She denies any associated nausea, vomiting, diaphoresis, or shortness of breath.  No cough, congestion, or hemoptysis was noted.  Physical Exam   Triage Vital Signs: ED Triage Vitals  Encounter Vitals Group     BP 09/23/24 1526 (!) 144/79     Girls Systolic BP Percentile --      Girls Diastolic BP Percentile --      Boys Systolic BP Percentile --      Boys Diastolic BP Percentile --      Pulse Rate 09/23/24 1526 100     Resp 09/23/24 1526 20     Temp 09/23/24 1526 98.6 F (37 C)     Temp src --      SpO2 09/23/24 1526 99 %     Weight 09/23/24 1527 248 lb (112.5 kg)     Height 09/23/24 1527 5' 4 (1.626 m)     Head Circumference --      Peak Flow --      Pain Score 09/23/24 1526 7     Pain Loc --      Pain Education --      Exclude from Growth Chart --     Most recent vital signs: Vitals:   09/23/24 1526  BP: (!) 144/79  Pulse: 100  Resp: 20  Temp: 98.6 F (37 C)  SpO2: 99%    General Awake, no distress.  NAD HEENT NCAT. PERRL. EOMI. No rhinorrhea. Mucous membranes are moist.  CV:  Good peripheral perfusion. RRR.  No murmurs, rubs, or gallops. RESP:  Normal effort.  CTA ABD:  No distention.    ED Results / Procedures / Treatments   Labs (all labs ordered are listed, but only abnormal results are displayed) Labs Reviewed  BASIC METABOLIC PANEL WITH GFR - Abnormal; Notable for the following components:      Result Value   Glucose, Bld 183 (*)    All other components within normal limits  CBC  TROPONIN T, HIGH  SENSITIVITY  TROPONIN T, HIGH SENSITIVITY     EKG  Vent. rate 90 BPM  PR interval 168 ms  QRS duration 80 ms  QT/QTcB 364/445 ms  P-R-T axes 57 58 -2  Normal sinus rhythm  No STEMI  RADIOLOGY  I personally viewed and evaluated these images as part of my medical decision making, as well as reviewing the written report by the radiologist.  ED Provider Interpretation: No acute findings  DG Chest 2 View Result Date: 09/23/2024 EXAM: 2 VIEW(S) XRAY OF THE CHEST 09/23/2024 03:56:00 PM COMPARISON: Comparison with 04/13/2024. CLINICAL HISTORY: Chest pain. FINDINGS: LUNGS AND PLEURA: Circumscribed nodular opacity projecting over the right lung base measuring 2.7 cm in diameter. No pleural effusion. No pneumothorax. HEART AND MEDIASTINUM: The circumscribed nodular opacity projecting over the right lung base, described in the LUNGS AND PLEURA section, likely corresponds to prominent pericardial fat as seen on prior cardiac CT 05/11/2024. No other acute abnormality of the cardiac and mediastinal silhouettes. BONES AND SOFT TISSUES: No acute  osseous abnormality. IMPRESSION: 1. No acute findings. 2. Circumscribed nodular opacity projecting over the right lung base measuring 2.7 cm in diameter, likely corresponding to prominent pericardial fat. Electronically signed by: Elsie Gravely MD 09/23/2024 04:33 PM EST RP Workstation: [id number]     PROCEDURES:  Critical Care performed: No  Procedures   MEDICATIONS ORDERED IN ED: Medications - No data to display   IMPRESSION / MDM / ASSESSMENT AND PLAN / ED COURSE  I reviewed the triage vital signs and the nursing notes.                              Differential diagnosis includes, but is not limited to,  ACS, aortic dissection, pulmonary embolism, cardiac tamponade, pneumothorax, pneumonia, pericarditis, myocarditis, GI-related causes including esophagitis/gastritis, and musculoskeletal chest wall pain.     Patient's presentation is most  consistent with acute complicated illness / injury requiring diagnostic workup.  Patient's diagnosis is consistent with nonspecific chest pain.  She presents to the ED endorsing left-sided chest pain that started about 1400 hrs.  She denies any associated nausea, vomiting, shortness of breath, or diaphoresis.  Patient's workup is largely reassuring.  No evidence of any acute lab abnormalities.  Troponin is flat x 2.  No EKG evidence of malignant arrhythmia.  Chest is returned by me, shows no acute process.  Patient stable throughout her course in the ED.  Doubt ACS given patient's symptoms.  No concern for pulmonary etiology as patient has been stable without hypoxia or tachypnea.  Patient is reassured by her normal workup at this time.  Patient will be discharged home with instructions to take her home meds as prescribed. Patient is to follow up with her primary provider as needed or otherwise directed. Patient is given ED precautions to return to the ED for any worsening or new symptoms.     FINAL CLINICAL IMPRESSION(S) / ED DIAGNOSES   Final diagnoses:  Nonspecific chest pain     Rx / DC Orders   ED Discharge Orders     None        Note:  This document was prepared using Dragon voice recognition software and may include unintentional dictation errors.    Loyd Candida LULLA Aldona, PA-C 09/29/24 0006    [place] Lorelle Cummins, MD 09/29/24 [phone]  "

## 2024-09-23 NOTE — ED Triage Notes (Signed)
 Pt to ED for chest pain started at 1400. Denies n/v/shob.

## 2024-09-23 NOTE — Discharge Instructions (Signed)
 Your exam, labs, EKG, and chest x-ray are normal and reassuring at this time.  No signs of a serious cardiac cause for your chest pain.  Continue to monitor symptoms and return to the ED if needed.  Follow-up with primary provider for ongoing, routine evaluation.

## 2024-09-30 ENCOUNTER — Inpatient Hospital Stay: Admitting: Family Medicine
